# Patient Record
Sex: Male | Born: 1965 | Race: Black or African American | Hispanic: No | Marital: Married | Smoking: Current every day smoker
Health system: Southern US, Community
[De-identification: ages and names within clinical notes are randomized; demographics above are authoritative.]

## PROBLEM LIST (undated history)

## (undated) DIAGNOSIS — M1990 Unspecified osteoarthritis, unspecified site: Secondary | ICD-10-CM

## (undated) DIAGNOSIS — N200 Calculus of kidney: Secondary | ICD-10-CM

## (undated) DIAGNOSIS — M25559 Pain in unspecified hip: Secondary | ICD-10-CM

## (undated) HISTORY — PX: HIP SURGERY: SHX245

## (undated) HISTORY — PX: HIATAL HERNIA REPAIR: SHX195

## (undated) HISTORY — PX: CIRCUMCISION: SUR203

## (undated) HISTORY — PX: APPENDECTOMY: SHX54

---

## 2007-01-08 ENCOUNTER — Emergency Department (HOSPITAL_COMMUNITY): Admission: EM | Admit: 2007-01-08 | Discharge: 2007-01-08 | Payer: Self-pay | Admitting: Family Medicine

## 2008-10-29 ENCOUNTER — Emergency Department (HOSPITAL_COMMUNITY): Admission: EM | Admit: 2008-10-29 | Discharge: 2008-10-29 | Payer: Self-pay | Admitting: Emergency Medicine

## 2008-11-11 ENCOUNTER — Ambulatory Visit (HOSPITAL_COMMUNITY): Admission: RE | Admit: 2008-11-11 | Discharge: 2008-11-12 | Payer: Self-pay | Admitting: Surgery

## 2009-08-04 ENCOUNTER — Emergency Department (HOSPITAL_COMMUNITY): Admission: EM | Admit: 2009-08-04 | Discharge: 2009-08-05 | Payer: Self-pay | Admitting: Emergency Medicine

## 2010-09-13 LAB — URINALYSIS, ROUTINE W REFLEX MICROSCOPIC
Bilirubin Urine: NEGATIVE
Glucose, UA: NEGATIVE mg/dL
Hgb urine dipstick: NEGATIVE
Protein, ur: NEGATIVE mg/dL
Specific Gravity, Urine: 1.03 (ref 1.005–1.030)
pH: 6 (ref 5.0–8.0)

## 2010-10-03 LAB — DIFFERENTIAL
Basophils Absolute: 0.1 10*3/uL (ref 0.0–0.1)
Basophils Relative: 1 % (ref 0–1)
Eosinophils Absolute: 0.2 K/uL (ref 0.0–0.7)
Eosinophils Relative: 2 % (ref 0–5)
Lymphocytes Relative: 37 % (ref 12–46)
Lymphs Abs: 3 K/uL (ref 0.7–4.0)
Monocytes Absolute: 0.4 10*3/uL (ref 0.1–1.0)
Monocytes Relative: 4 % (ref 3–12)
Neutro Abs: 4.5 K/uL (ref 1.7–7.7)
Neutrophils Relative %: 55 % (ref 43–77)

## 2010-10-03 LAB — COMPREHENSIVE METABOLIC PANEL
AST: 21 U/L (ref 0–37)
Calcium: 9.2 mg/dL (ref 8.4–10.5)
GFR calc Af Amer: >60 mL/min (ref >60)
GFR calc non Af Amer: >60 mL/min (ref >60)
Glucose, Bld: 106 mg/dL — ABNORMAL HIGH (ref 70–99)
Potassium: 4.1 mEq/L (ref 3.5–5.1)
Total Bilirubin: 0.7 mg/dL (ref 0.3–1.2)
Total Protein: 6.3 g/dL (ref 6.0–8.3)

## 2010-10-03 LAB — BASIC METABOLIC PANEL
BUN: 8 mg/dL (ref 6–23)
CO2: 28 mEq/L (ref 19–32)
Calcium: 9.9 mg/dL (ref 8.4–10.5)
Chloride: 107 mEq/L (ref 96–112)
Creatinine, Ser: 1.07 mg/dL (ref 0.4–1.5)
GFR calc Af Amer: >60 mL/min (ref >60)
GFR calc non Af Amer: >60 mL/min (ref >60)
Glucose, Bld: 93 mg/dL (ref 70–99)
Potassium: 5 mEq/L (ref 3.5–5.1)

## 2010-10-03 LAB — COMPREHENSIVE METABOLIC PANEL WITH GFR
ALT: 16 U/L (ref 0–53)
Albumin: 3.9 g/dL (ref 3.5–5.2)
Alkaline Phosphatase: 101 U/L (ref 39–117)
BUN: 10 mg/dL (ref 6–23)
CO2: 24 meq/L (ref 19–32)
Chloride: 109 meq/L (ref 96–112)
Creatinine, Ser: 0.92 mg/dL (ref 0.4–1.5)
Sodium: 137 meq/L (ref 135–145)

## 2010-10-03 LAB — CBC
HCT: 43.5 % (ref 39.0–52.0)
Hemoglobin: 15.5 g/dL (ref 13.0–17.0)
Hemoglobin: 15.1 g/dL (ref 13.0–17.0)
MCHC: 34.8 g/dL (ref 30.0–36.0)
MCV: 99.9 fL (ref 78.0–100.0)
Platelets: 198 10*3/uL (ref 150–400)
Platelets: 187 10*3/uL (ref 150–400)
RBC: 4.35 MIL/uL (ref 4.22–5.81)
RDW: 13.2 % (ref 11.5–15.5)
WBC: 8.2 10*3/uL (ref 4.0–10.5)

## 2010-10-03 LAB — LIPASE, BLOOD: Lipase: 20 U/L (ref 11–59)

## 2010-11-07 NOTE — Op Note (Signed)
NAMESAMAY, DELCARLO              ACCOUNT NO.:  [id number]   MEDICAL RECORD NO.:  [id number]          PATIENT TYPE:  OIB   LOCATION:  5118                         FACILITY:  [facility]   PHYSICIAN:  Velora Heckler, MD      DATE OF BIRTH:  [birth year]   DATE OF PROCEDURE:  11/11/2008  DATE OF DISCHARGE:                               OPERATIVE REPORT   PREOPERATIVE DIAGNOSIS:  Ventral hernia, symptomatic.   POSTOPERATIVE DIAGNOSES:  Reducible ventral hernia, reducible umbilical  hernia.   PROCEDURE:  Repair ventral and umbilical hernia with Ethicon UltraPro  mesh.   SURGEON:  Velora Heckler, MD, FACS   ANESTHESIA:  General per Burna Forts, MD   ESTIMATED BLOOD LOSS:  Minimal.   PREPARATION:  ChloraPrep.   COMPLICATIONS:  None.   INDICATIONS:  The patient is a 45 year old black male from [place],  [place].  The patient has had epigastric abdominal pain for the  past month.  This developed after heavy lifting.  A workup in the  emergency department at [facility] included a CT scan showing  a small ventral hernia containing omental fat.  The patient was seen in  the office and scheduled for repair.   BODY OF REPORT:  Procedure was done in OR #60 at the [facility].  The patient was brought to the operating room and  placed in the supine position on the operating room table.  Following  administration of general anesthesia, the patient was prepped and draped  in the usual strict aseptic fashion.  After ascertaining that an  adequate level of anesthesia had been achieved, midline abdominal  incision was made just above the umbilicus.  Dissection was carried down  through the subcutaneous tissues.  Hernia sac was identified.  Hernia  was dissected out.  Dissection was carried down to the fascial plane and  the hernia sac was incised circumferentially around the fascia.  The  hernia sac was excised and discarded.  Hernia was easily  reduced.  Fascial plane was developed circumferentially with the electrocautery.  A finger was inserted through the hernia defect into the peritoneal  cavity and on palpation, there is also an umbilical hernia, which was  approximately 4 cm caudad to the ventral hernia.  Therefore, dissection  was carried inferiorly in the plane of the fascia and the umbilicus was  completely elevated off the abdominal wall.  There was approximately a 2-  cm defect at the umbilicus containing incarcerated preperitoneal fat.  The fascial plane was widely developed around both defects.  Both  fascial defects were then closed transversely with interrupted 0  Ethibond sutures.  Next, a 3 inch x 6 inch sheet of Ethicon UltraPro  mesh was trimmed around the corners and it was placed as an onlay over  the closure.  It was secured to the fascia circumferentially with  interrupted 0 Ethibond sutures.  The umbilicus was reaffixed to the  abdominal wall with a 0 Ethibond suture.  Subcutaneous tissues were  closed with interrupted 3-0 Vicryl sutures.  Skin was anesthetized with  local anesthetic.  Skin was closed with running 4-0 Monocryl  subcuticular suture.  Wound was washed and dried, and benzoin and Steri-  Strips were applied.  Sterile dressings were applied.  The patient was  awakened from anesthesia and brought to the recovery room in stable  condition.  The patient tolerated the procedure well.      Velora Heckler, MD  Electronically Signed     TMG/MEDQ  D:  11/11/2008  T:  11/11/2008  Job:  [id number]

## 2010-11-26 IMAGING — CT CT ABDOMEN W/ CM
1 of 3 series · 14 of 32 positions shown, 19 images · IV contrast (agent unspecified)
Comparison: None

CT ABDOMEN

CLINICAL DATA: Epigastric pain.

CT ABDOMEN AND PELVIS WITH CONTRAST
TECHNIQUE: Multidetector CT imaging of the abdomen and pelvis was
performed using the standard protocol following bolus
administration of intravenous contrast.
Contrast: 125 ml of 6mnipaque-HAA

[Series 2: rtn ap with st · axial · 0.87mm/px · z∈[-311,+79]mm · 14 of 88 slices shown, 19 images]
[im 5/88  soft-tissue]
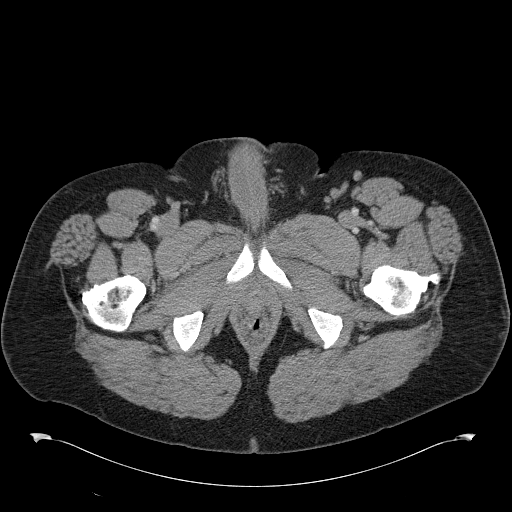
[im 5/88  bone]
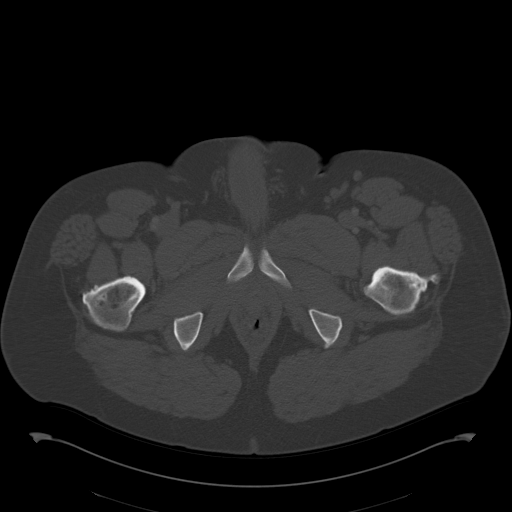
[im 14/88  soft-tissue]
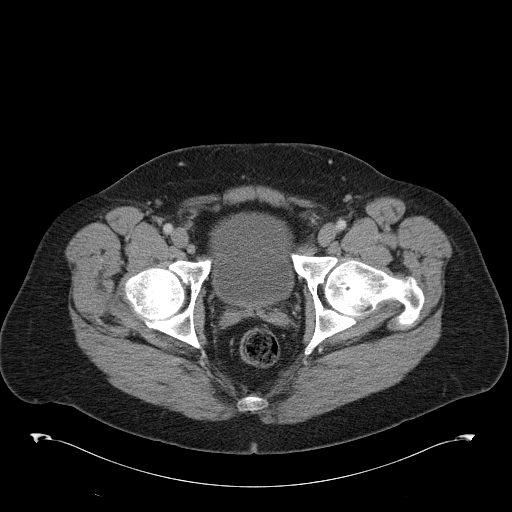
[im 19/88  soft-tissue]
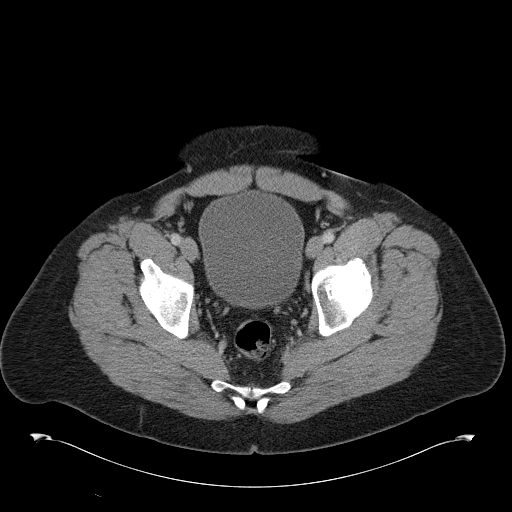
[im 23/88  soft-tissue]
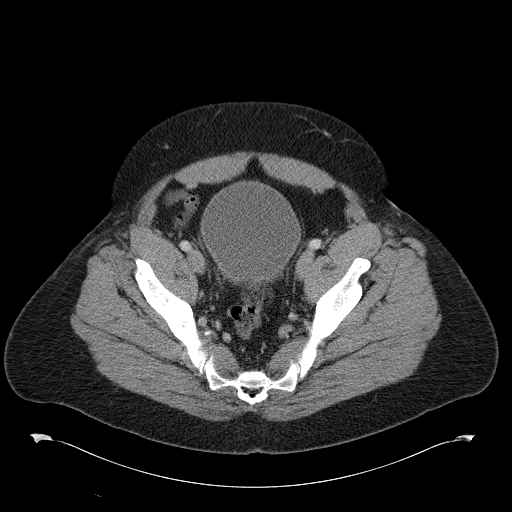
[im 33/88  soft-tissue]
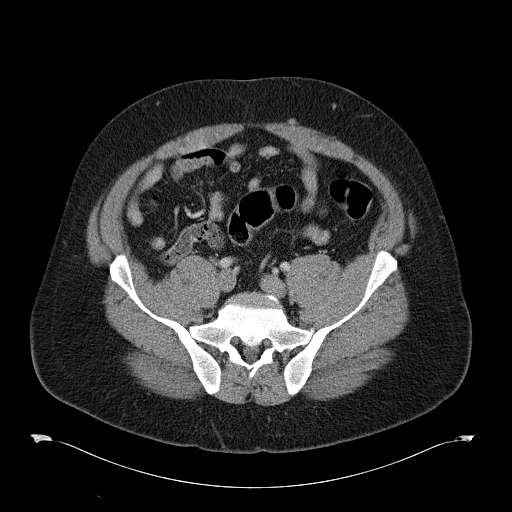
[im 37/88  soft-tissue]
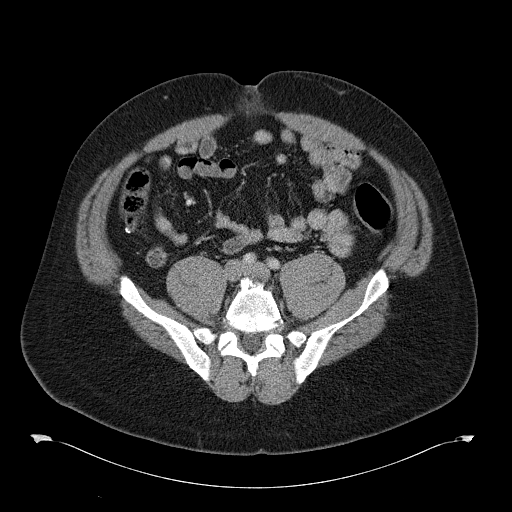
[im 46/88  soft-tissue]
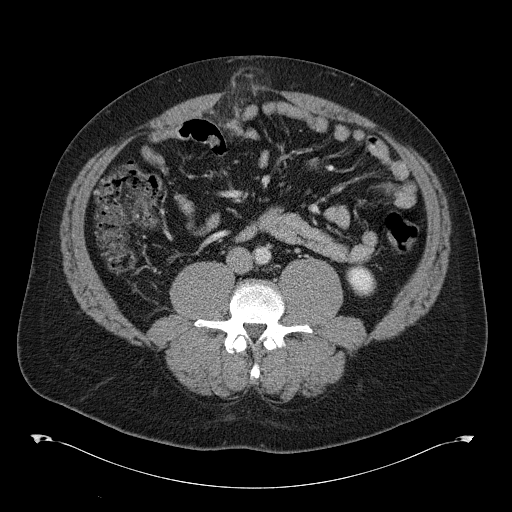
[im 51/88  soft-tissue]
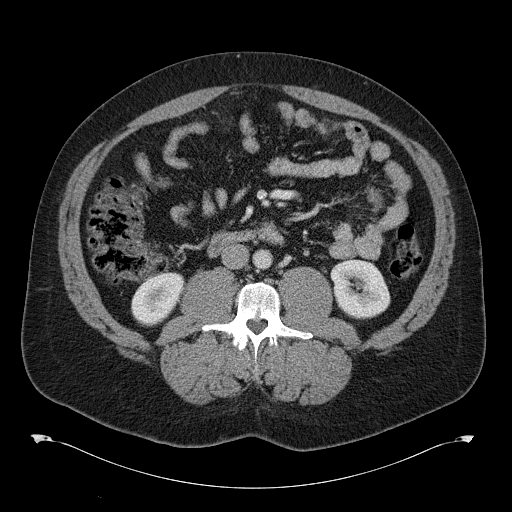
[im 55/88  soft-tissue]
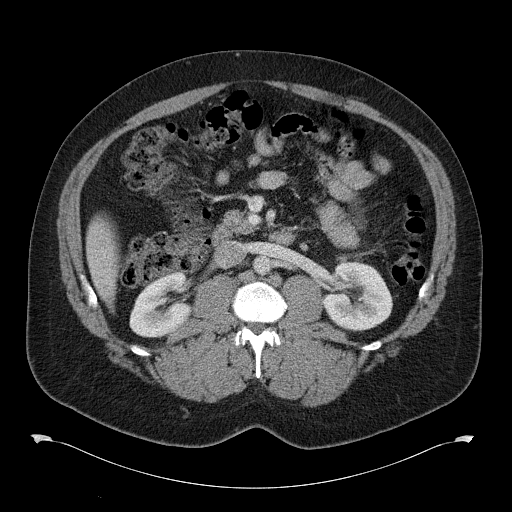
[im 55/88  bone]
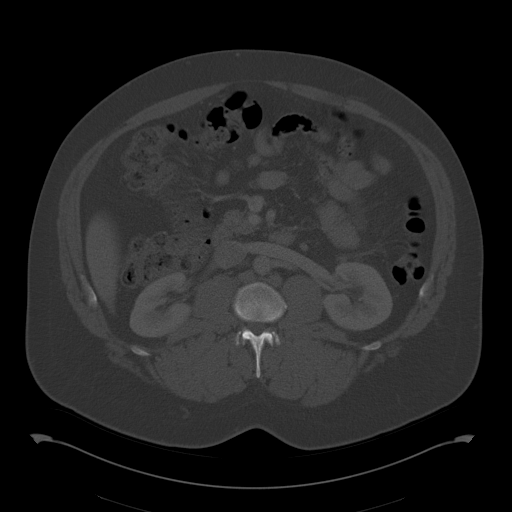
[im 65/88  soft-tissue]
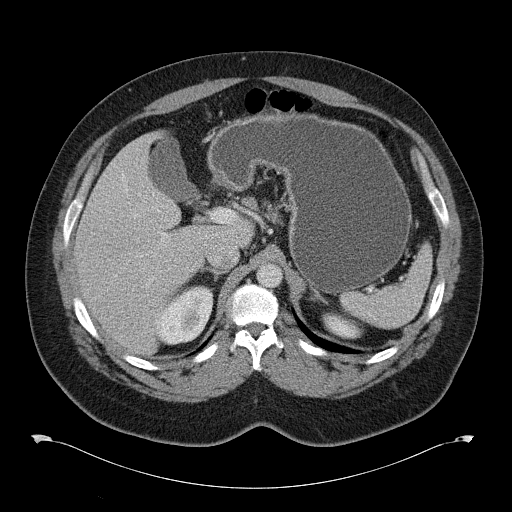
[im 69/88  soft-tissue]
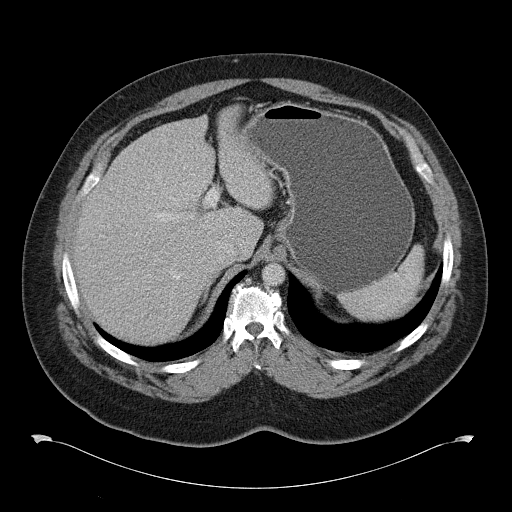
[im 69/88  lung]
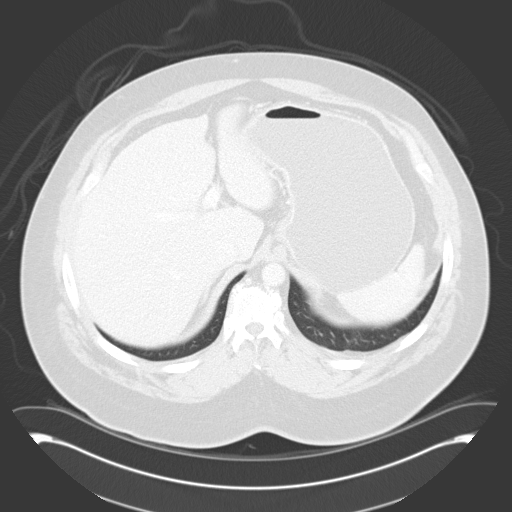
[im 74/88  soft-tissue]
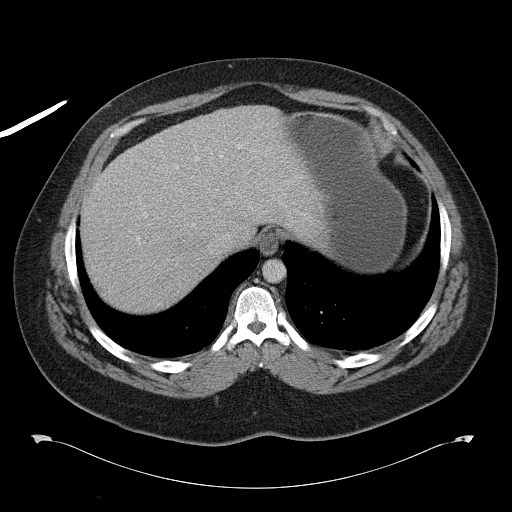
[im 74/88  lung]
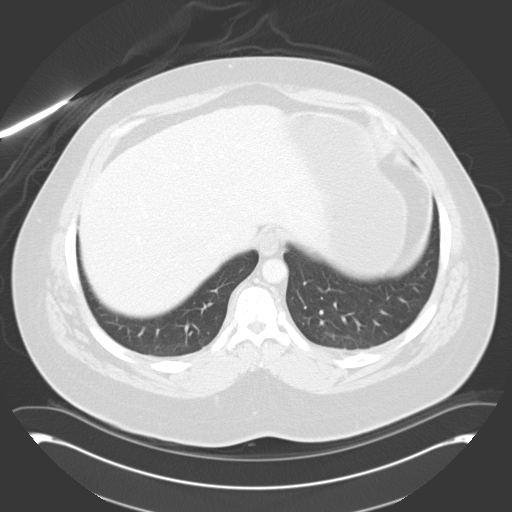
[im 78/88  lung]
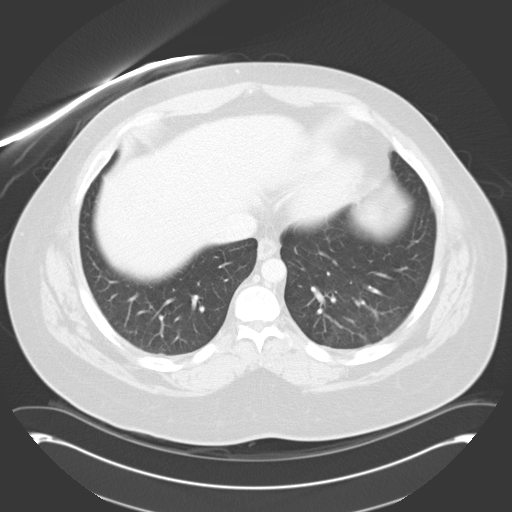
[im 83/88  soft-tissue]
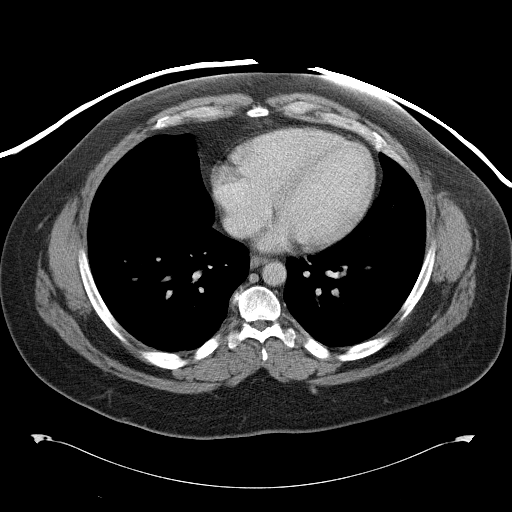
[im 83/88  lung]
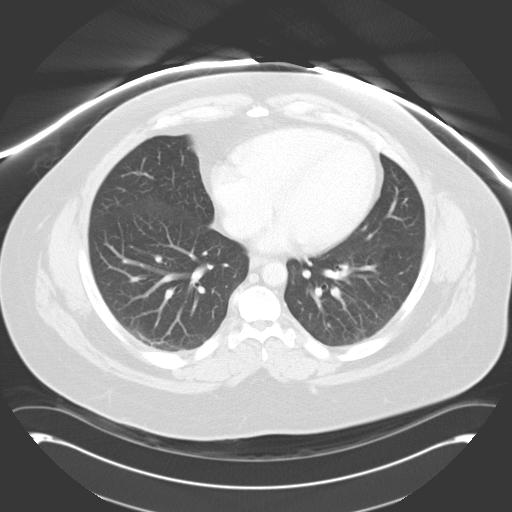

[14 of 32 positions shown; findings below may reference images not displayed]

FINDINGS: The lung bases are clear.  The solid abdominal organs
are normal.  The stomach is distended with fluid.  The duodenum,
small bowel and colon unremarkable.  No mesenteric or
retroperitoneal mass or adenopathy.  The aorta is normal in
caliber.  The major branch vessels are normal.  The portal and
splenic veins are patent.

There is a small anterior abdominal wall hernia just above the
umbilicus.  This contains omental fat and vessels.  Mild
interstitial changes may suggest mild inflammation.  This may be
responsible for the patient's pain.  Recommend clinical
correlation.

No significant bony findings.
IMPRESSION: 1.  Small anterior abdominal wall hernia containing omental fat and
vessels but no bowel.  Mild inflammatory changes.
2.  Unremarkable CT appearance of the solid abdominal organs.
3.  Unremarkable CT appearance of the stomach, small bowel and
colon.

CT PELVIS
FINDINGS: The rectum, sigmoid colon and visualized small bowel
loops are unremarkable.  The bladder appears normal.  No pelvic
mass, adenopathy or free pelvic fluid collection.  There are
surgical changes from previous appendectomy.  No inguinal mass or
hernia.  The bony pelvis is intact.  There are remote surgical
changes involving both hips.
IMPRESSION: No acute pelvic findings, mass lesions or adenopathy.

## 2011-08-31 IMAGING — CR DG CHEST 2V
2 series · 2 of 2 positions shown · non-contrast
Comparison: None

CLINICAL DATA: Headache and body pain

CHEST - 2 VIEW

[w chest pa]
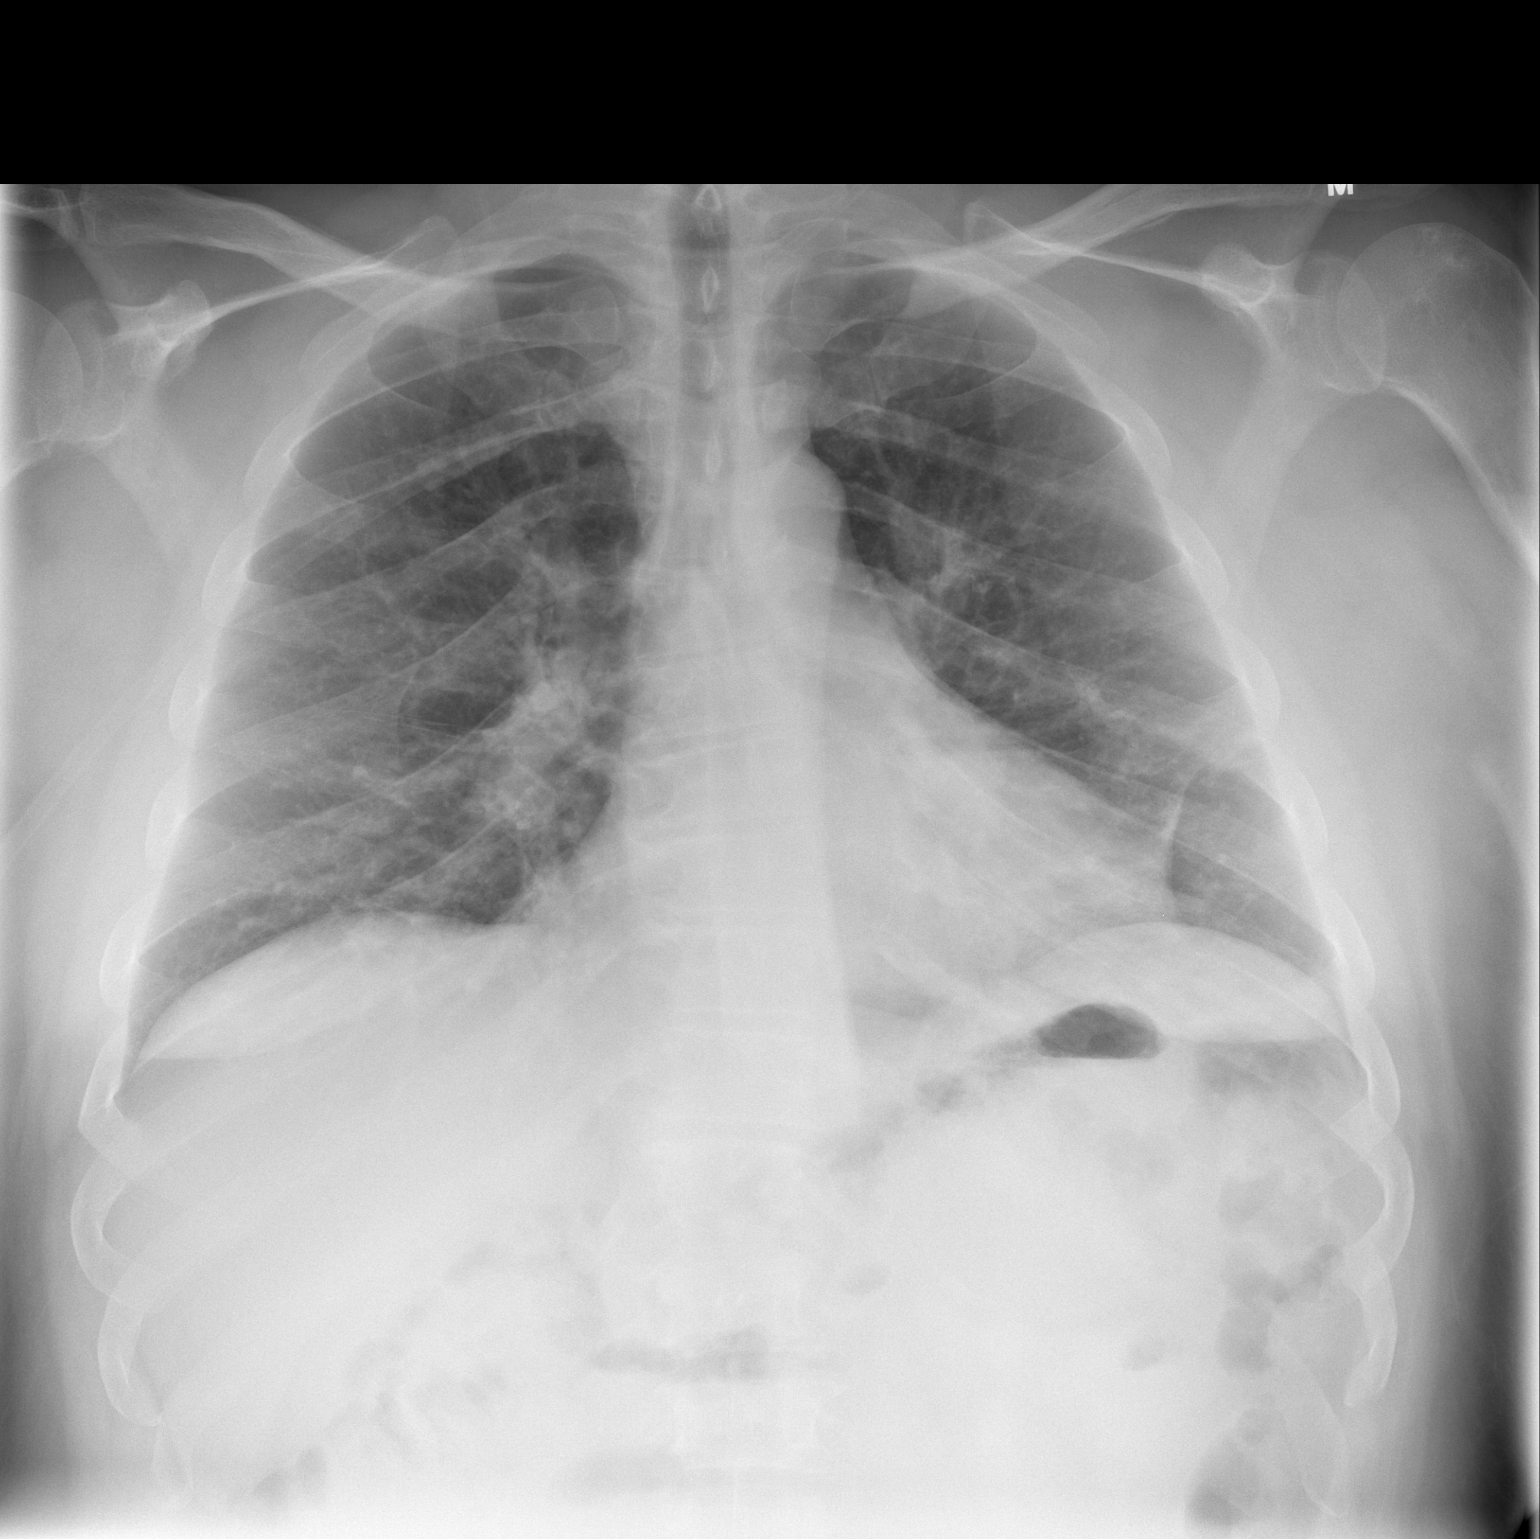

[w chest lat]
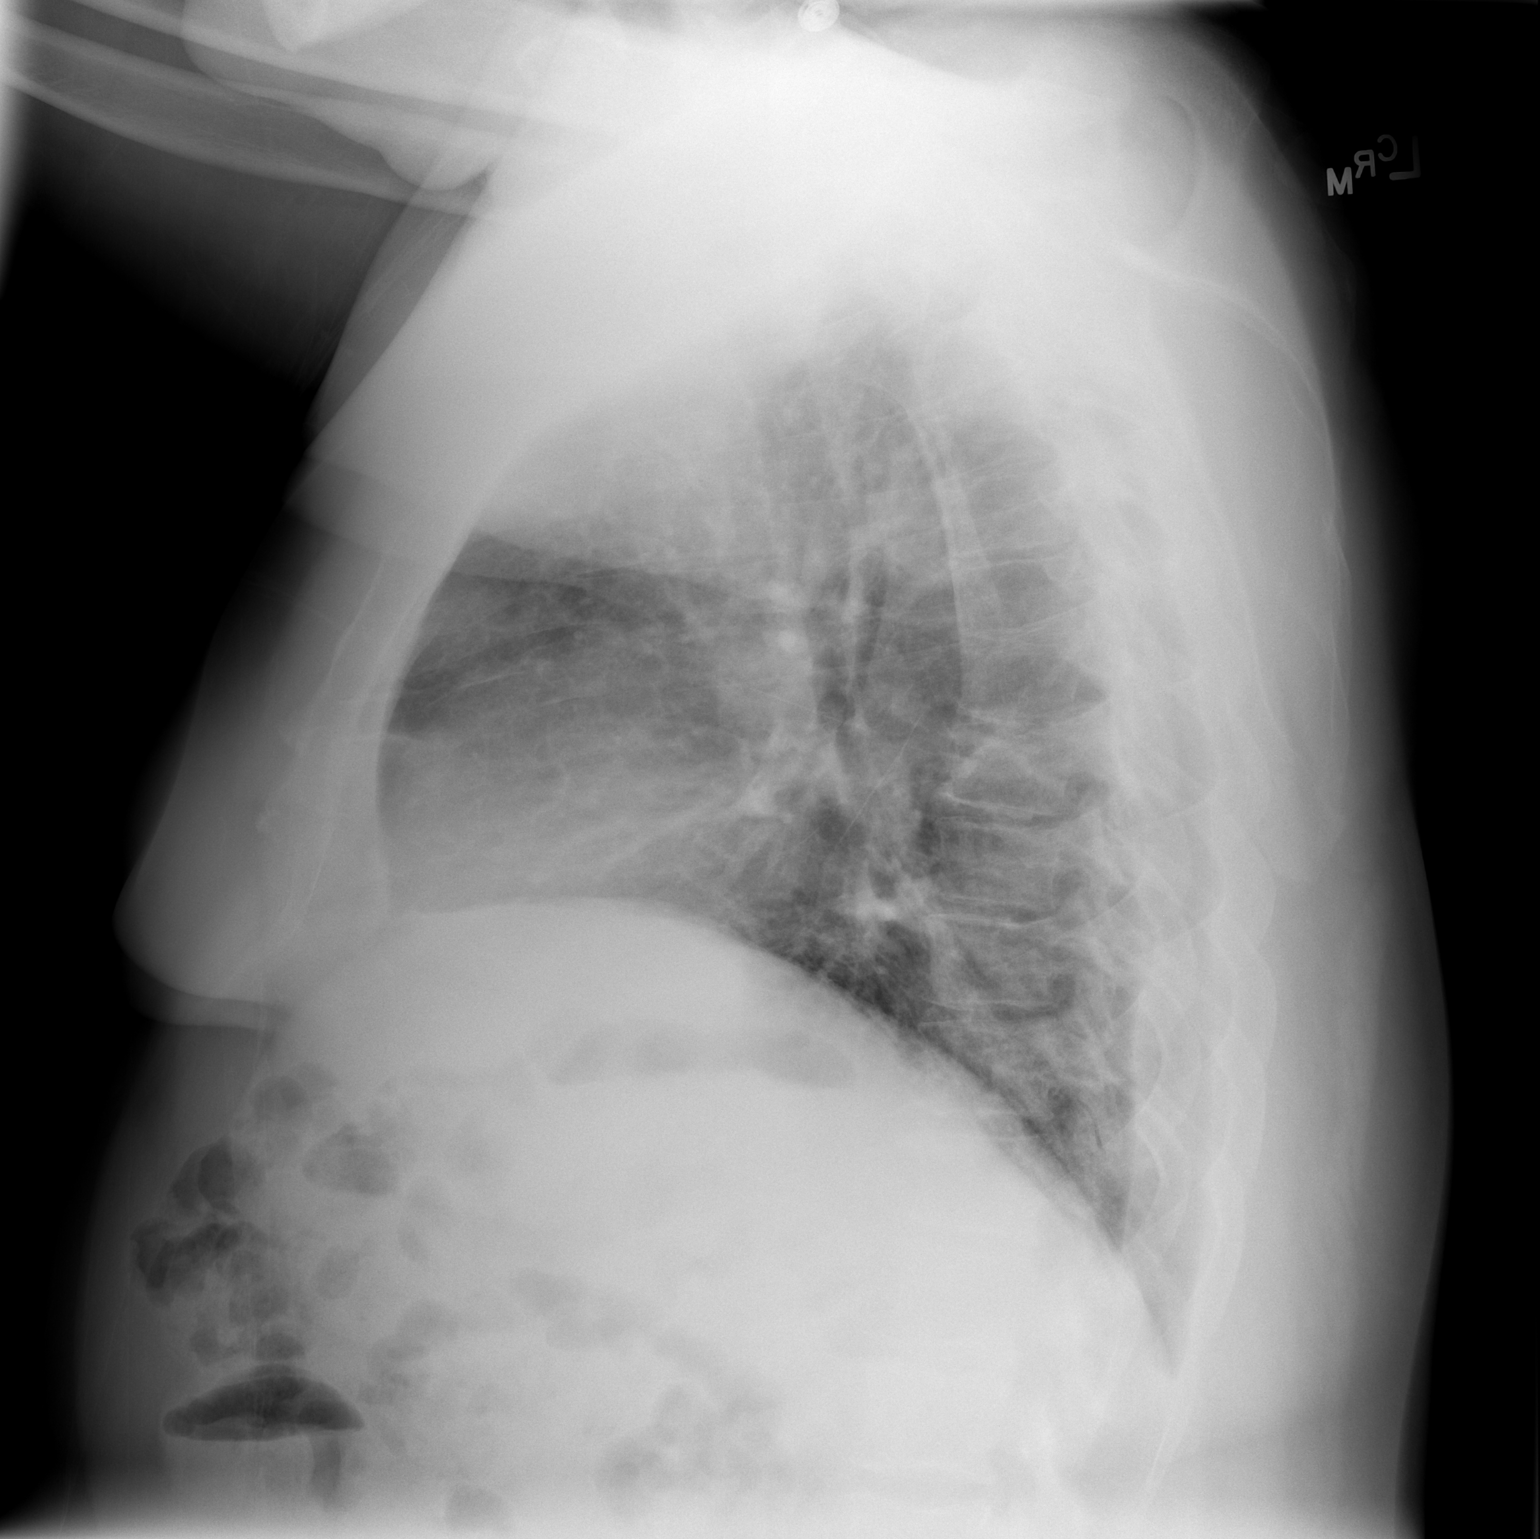

[2 of 2 positions shown; findings below may reference images not displayed]

FINDINGS: The heart size is normal.

No pleural effusion or pulmonary edema.

Scar versus atelectasis is noted within the left lung base.

No airspace consolidation identified.
IMPRESSION: 1.  Left base scar versus atelectasis.

## 2022-06-21 DIAGNOSIS — M25552 Pain in left hip: Secondary | ICD-10-CM | POA: Insufficient documentation

## 2022-10-18 DIAGNOSIS — M25551 Pain in right hip: Secondary | ICD-10-CM | POA: Insufficient documentation

## 2022-10-23 DIAGNOSIS — M1611 Unilateral primary osteoarthritis, right hip: Secondary | ICD-10-CM | POA: Insufficient documentation

## 2022-12-25 ENCOUNTER — Encounter: Payer: Self-pay | Admitting: Physician Assistant

## 2022-12-25 ENCOUNTER — Other Ambulatory Visit (INDEPENDENT_AMBULATORY_CARE_PROVIDER_SITE_OTHER): Payer: Self-pay

## 2022-12-25 ENCOUNTER — Ambulatory Visit (INDEPENDENT_AMBULATORY_CARE_PROVIDER_SITE_OTHER): Payer: Self-pay | Admitting: Physician Assistant

## 2022-12-25 DIAGNOSIS — M25551 Pain in right hip: Secondary | ICD-10-CM

## 2022-12-25 DIAGNOSIS — G8929 Other chronic pain: Secondary | ICD-10-CM

## 2022-12-25 DIAGNOSIS — M545 Low back pain, unspecified: Secondary | ICD-10-CM

## 2022-12-25 DIAGNOSIS — M25552 Pain in left hip: Secondary | ICD-10-CM

## 2022-12-25 DIAGNOSIS — M167 Other unilateral secondary osteoarthritis of hip: Secondary | ICD-10-CM

## 2022-12-25 NOTE — Progress Notes (Signed)
Office Visit Note   Patient: Leon Rodriguez           Date of Birth: 1965-07-04           MRN: [id number] Visit Date: 12/25/2022              Requested by: No referring provider defined for this encounter. PCP: No primary care provider on file.   Assessment & Plan: Visit Diagnoses:  1. Chronic bilateral low back pain, unspecified whether sciatica present   2. Bilateral hip pain   3. Other secondary osteoarthritis of right hip     Plan: Given patient's lack of primary care provider if that he is not having seen by a primary care provider in years recommend that he establish with a primary care provider.  Will have him undergo an MRI of his left hip to rule out AVN and also evaluate cartilage.  Once he has had the study performed having follow-up with Dr. Magnus Ivan.  Regards to his right hip he understands that he has significant arthritis and due to the fact of his failure of conservative treatment to temporize his pain, hip replacement could provide him with good relief.  However patient has a very legitimate concern for undergoing hip replacement as his father died at age 34 when he had bilateral hip replacements performed and developed pulmonary embolus.  We had a long talk about how we perform hip replacement from the anterior approach and that we would only perform 1 hip at the time.  Also discussed anticoagulation postop.  We discussed the postoperative protocol for total hip arthroplasty at length.  Questions were answered.  Follow-Up Instructions: Return for after MRI.   Orders:  Orders Placed This Encounter  Procedures   XR HIPS BILAT W OR W/O PELVIS 2V   XR Lumbar Spine 2-3 Views   No orders of the defined types were placed in this encounter.     Procedures: No procedures performed   Clinical Data: No additional findings.   Subjective: Chief Complaint  Patient presents with   Left Hip - Pain   Right Hip - Pain    HPI HPI: Leon Rodriguez 57 year old male comes  in today for second opinion of bilateral hip pain.  He states he has had pain in both hips that began last October or November.  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  He has had no new injury to either hip.  He has been seen at [facility] was given a left hip injection which took away his severe pain that he was having in the hip.  The right hip pain gave him no relief.  Does have some back pain.  He has stiffness that runs down the medial aspect of both legs down to the ankles.  He has difficulty sleeping due to the pain ranks pain to be 10 out of 10 pain at worst.  Has trouble donning shoes and socks.  Also states that he is unable to mow his grass due to the hip pain.  He also notes that it affects his sex life.  He has tried Mobic without any real relief.  He is someone that reports himself to be healthy but has no primary care physician states he does not go to the doctor unless he absolutely has to.  Review of Systems See HPI otherwise negative or noncontributory  Objective: Vital Signs: There were no vitals taken for this visit.  Physical Exam Constitutional:      Appearance: He is not ill-appearing or diaphoretic.  Pulmonary:     Effort: Pulmonary effort is normal.  Neurological:     Mental Status: He is alert and oriented to person, place, and time.  Psychiatric:        Mood and Affect: Mood normal.        Behavior: Behavior normal.     Ortho Exam Knees: Bilateral hips severely limited internal rotation of both hips.  Good external rotation of both hips.  Internal rotation both hips causes significant pain.  Lower extremities 5 out of 5 strength throughout lower extremities negative straight leg raise bilaterally.  Sensation grossly intact bilateral feet to light touch.  Specialty Comments:  No specialty comments available.  Imaging: XR HIPS BILAT W OR W/O  PELVIS 2V  Result Date: 12/25/2022 Bilateral hips lateral view and AP pelvis: No acute fractures.  Postoperative changes with large calcifications just off the proximal femoral shaft at the level just inferior to the greater trochanters.  Cystic changes of bilateral femoral heads could be postsurgical versus AVN.  Right hip near bone-on-bone arthritis.  Left hip joint is overall well-maintained.  XR Lumbar Spine 2-3 Views  Result Date: 12/25/2022 Lumbar spine 2 views: No acute fractures.  Slight scoliosis.  L4 and L5 anterior vertebral spurring.  Disc space overall well-maintained.  No acute findings.    PMFS History: There are no problems to display for this patient.  No past medical history on file.  No family history on file.   Social History   Occupational History   Not on file  Tobacco Use   Smoking status: Not on file   Smokeless tobacco: Not on file  Substance and Sexual Activity   Alcohol use: Not on file   Drug use: Not on file   Sexual activity: Not on file

## 2023-01-06 ENCOUNTER — Emergency Department (HOSPITAL_COMMUNITY): Payer: Self-pay

## 2023-01-06 ENCOUNTER — Observation Stay (HOSPITAL_COMMUNITY)
Admission: EM | Admit: 2023-01-06 | Discharge: 2023-01-07 | Disposition: A | Discharge status: Home / Self Care | Payer: Self-pay | Attending: Internal Medicine | Admitting: Internal Medicine

## 2023-01-06 ENCOUNTER — Other Ambulatory Visit: Payer: Self-pay

## 2023-01-06 ENCOUNTER — Encounter (HOSPITAL_COMMUNITY): Payer: Self-pay

## 2023-01-06 DIAGNOSIS — N453 Epididymo-orchitis: Secondary | ICD-10-CM | POA: Diagnosis present

## 2023-01-06 DIAGNOSIS — N209 Urinary calculus, unspecified: Secondary | ICD-10-CM | POA: Diagnosis not present

## 2023-01-06 DIAGNOSIS — R824 Acetonuria: Secondary | ICD-10-CM | POA: Insufficient documentation

## 2023-01-06 DIAGNOSIS — N433 Hydrocele, unspecified: Secondary | ICD-10-CM | POA: Diagnosis not present

## 2023-01-06 DIAGNOSIS — R509 Fever, unspecified: Secondary | ICD-10-CM | POA: Diagnosis present

## 2023-01-06 DIAGNOSIS — N529 Male erectile dysfunction, unspecified: Secondary | ICD-10-CM | POA: Insufficient documentation

## 2023-01-06 DIAGNOSIS — Z7982 Long term (current) use of aspirin: Secondary | ICD-10-CM | POA: Diagnosis not present

## 2023-01-06 DIAGNOSIS — A419 Sepsis, unspecified organism: Secondary | ICD-10-CM | POA: Diagnosis not present

## 2023-01-06 DIAGNOSIS — Z1152 Encounter for screening for COVID-19: Secondary | ICD-10-CM | POA: Insufficient documentation

## 2023-01-06 DIAGNOSIS — F17200 Nicotine dependence, unspecified, uncomplicated: Secondary | ICD-10-CM | POA: Insufficient documentation

## 2023-01-06 LAB — CBC WITH DIFFERENTIAL/PLATELET
Abs Immature Granulocytes: 0.04 10*3/uL (ref 0.00–0.07)
Basophils Absolute: 0 10*3/uL (ref 0.0–0.1)
Basophils Relative: 0 %
Eosinophils Absolute: 0 10*3/uL (ref 0.0–0.5)
Eosinophils Relative: 0 %
HCT: 44.6 % (ref 39.0–52.0)
Hemoglobin: 16 g/dL (ref 13.0–17.0)
Immature Granulocytes: 0 %
Lymphocytes Relative: 9 %
Lymphs Abs: 0.9 10*3/uL (ref 0.7–4.0)
MCH: 34.1 pg — ABNORMAL HIGH (ref 26.0–34.0)
MCHC: 35.9 g/dL (ref 30.0–36.0)
MCV: 95.1 fL (ref 80.0–100.0)
Monocytes Absolute: 0.7 10*3/uL (ref 0.1–1.0)
Monocytes Relative: 7 %
Neutro Abs: 8.8 10*3/uL — ABNORMAL HIGH (ref 1.7–7.7)
Neutrophils Relative %: 84 %
Platelets: 239 10*3/uL (ref 150–400)
RBC: 4.69 MIL/uL (ref 4.22–5.81)
RDW: 12.3 % (ref 11.5–15.5)
WBC: 10.5 10*3/uL (ref 4.0–10.5)
nRBC: 0 % (ref 0.0–0.2)

## 2023-01-06 LAB — URINALYSIS, W/ REFLEX TO CULTURE (INFECTION SUSPECTED)
Bilirubin Urine: NEGATIVE
Glucose, UA: NEGATIVE mg/dL
Ketones, ur: NEGATIVE mg/dL
Nitrite: POSITIVE — AB
Protein, ur: NEGATIVE mg/dL
Specific Gravity, Urine: 1.009 (ref 1.005–1.030)
pH: 6 (ref 5.0–8.0)

## 2023-01-06 LAB — COMPREHENSIVE METABOLIC PANEL
ALT: 29 U/L (ref 0–44)
AST: 35 U/L (ref 15–41)
Albumin: 3.6 g/dL (ref 3.5–5.0)
Alkaline Phosphatase: 94 U/L (ref 38–126)
Anion gap: 15 (ref 5–15)
BUN: 7 mg/dL (ref 6–20)
CO2: 23 mmol/L (ref 22–32)
Calcium: 9.4 mg/dL (ref 8.9–10.3)
Chloride: 96 mmol/L — ABNORMAL LOW (ref 98–111)
Creatinine, Ser: 1.01 mg/dL (ref 0.61–1.24)
GFR, Estimated: >60 mL/min (ref >60)
Glucose, Bld: 163 mg/dL — ABNORMAL HIGH (ref 70–99)
Potassium: 4 mmol/L (ref 3.5–5.1)
Sodium: 134 mmol/L — ABNORMAL LOW (ref 135–145)
Total Bilirubin: 0.8 mg/dL (ref 0.3–1.2)
Total Protein: 7.2 g/dL (ref 6.5–8.1)

## 2023-01-06 LAB — RESP PANEL BY RT-PCR (RSV, FLU A&B, COVID)  RVPGX2
Influenza A by PCR: NEGATIVE
Influenza B by PCR: NEGATIVE
Resp Syncytial Virus by PCR: NEGATIVE
SARS Coronavirus 2 by RT PCR: NEGATIVE

## 2023-01-06 LAB — PROTIME-INR
INR: 1.1 (ref 0.8–1.2)
Prothrombin Time: 14 seconds (ref 11.4–15.2)

## 2023-01-06 LAB — URINALYSIS, ROUTINE W REFLEX MICROSCOPIC
Glucose, UA: NEGATIVE mg/dL
Ketones, ur: 20 mg/dL — AB
Nitrite: POSITIVE — AB
Protein, ur: NEGATIVE mg/dL
Specific Gravity, Urine: 1.021 (ref 1.005–1.030)
WBC, UA: >50 WBC/hpf (ref 0–5)
pH: 5 (ref 5.0–8.0)

## 2023-01-06 LAB — APTT: aPTT: 30 seconds (ref 24–36)

## 2023-01-06 LAB — HEMOGLOBIN A1C
Hgb A1c MFr Bld: 5.6 % (ref 4.8–5.6)
Mean Plasma Glucose: 114.02 mg/dL

## 2023-01-06 LAB — I-STAT CG4 LACTIC ACID, ED: Lactic Acid, Venous: 1.1 mmol/L (ref 0.5–1.9)

## 2023-01-06 LAB — HIV ANTIBODY (ROUTINE TESTING W REFLEX): HIV Screen 4th Generation wRfx: NONREACTIVE

## 2023-01-06 MED ORDER — VANCOMYCIN HCL 2000 MG/400ML IV SOLN
2000.0000 mg | Freq: Once | INTRAVENOUS | Status: DC
  Administered 2023-01-06: 2000 mg via INTRAVENOUS
  Filled 2023-01-06: qty 400

## 2023-01-06 MED ORDER — LACTATED RINGERS IV SOLN: INTRAVENOUS | Status: DC

## 2023-01-06 MED ORDER — LACTATED RINGERS IV BOLUS (SEPSIS)
1000.0000 mL | Freq: Once | INTRAVENOUS | Status: AC
  Administered 2023-01-06: 1000 mL via INTRAVENOUS

## 2023-01-06 MED ORDER — ENOXAPARIN SODIUM 40 MG/0.4ML IJ SOSY
40.0000 mg | PREFILLED_SYRINGE | INTRAMUSCULAR | Status: DC
  Administered 2023-01-06: 40 mg via SUBCUTANEOUS
  Filled 2023-01-06: qty 0.4

## 2023-01-06 MED ORDER — ACETAMINOPHEN 500 MG PO TABS
1000.0000 mg | ORAL_TABLET | Freq: Once | ORAL | Status: AC
  Administered 2023-01-06: 1000 mg via ORAL
  Filled 2023-01-06: qty 2

## 2023-01-06 MED ORDER — LACTATED RINGERS IV BOLUS (SEPSIS)
500.0000 mL | Freq: Once | INTRAVENOUS | Status: AC
  Administered 2023-01-06: 500 mL via INTRAVENOUS

## 2023-01-06 MED ORDER — SODIUM CHLORIDE 0.9 % IV SOLN
2.0000 g | Freq: Three times a day (TID) | INTRAVENOUS | Status: DC
  Administered 2023-01-06 – 2023-01-07 (×2): 2 g via INTRAVENOUS
  Filled 2023-01-06 (×2): qty 12.5

## 2023-01-06 MED ORDER — METRONIDAZOLE 500 MG/100ML IV SOLN
500.0000 mg | Freq: Once | INTRAVENOUS | Status: AC
  Administered 2023-01-06: 500 mg via INTRAVENOUS
  Filled 2023-01-06: qty 100

## 2023-01-06 MED ORDER — VANCOMYCIN HCL IN DEXTROSE 1-5 GM/200ML-% IV SOLN
1000.0000 mg | Freq: Once | INTRAVENOUS | Status: DC
  Filled 2023-01-06: qty 200

## 2023-01-06 MED ORDER — ACETAMINOPHEN 500 MG PO TABS
1000.0000 mg | ORAL_TABLET | Freq: Three times a day (TID) | ORAL | Status: DC | PRN
  Administered 2023-01-06 – 2023-01-07 (×2): 1000 mg via ORAL
  Filled 2023-01-06 (×2): qty 2

## 2023-01-06 MED ORDER — POLYETHYLENE GLYCOL 3350 17 G PO PACK: 17.0000 g | PACK | Freq: Every day | ORAL | Status: DC | PRN

## 2023-01-06 MED ORDER — SODIUM CHLORIDE 0.9 % IV SOLN
2.0000 g | Freq: Once | INTRAVENOUS | Status: AC
  Administered 2023-01-06: 2 g via INTRAVENOUS
  Filled 2023-01-06: qty 12.5

## 2023-01-06 NOTE — Sepsis Progress Note (Signed)
Elink monitoring code sepsis 

## 2023-01-06 NOTE — ED Notes (Signed)
Patient transported to X-ray 

## 2023-01-06 NOTE — H&P (Addendum)
Date: 01/06/2023               Patient Name:  Leon Rodriguez MRN: [id number]  DOB: 28-Dec-1965 Age / Sex: 57 y.o., male   PCP: Pcp, No              Medical Service: Internal Medicine Teaching Service              Attending Physician: Dr. Dickie La, MD    First Contact: Governor Rooks, MS3 Pager: [phone]  Second Contact: Dr. Faith Rogue Pager: [phone]  Third Contact Dr. Morene Crocker Pager: [phone]       After Hours (After 5p/  First Contact Pager: [phone]  weekends / holidays): Second Contact Pager: [phone]   Chief Complaint: Fever and testicular pain  History of Present Illness:   SHAQUELL HEHN is a 57 y.o. M with a PMHx significant for arthritis in L hip, hiatal hernia s/p mesh who presented with a week of intermittent fevers and one day of R testicular pain admitted for epididymo-orchitis.  ED course is significant for scrotal ultrasound showing right epididymo-orchitis and small reactive hydrocele without torsion. CT AP with 2 mm nonobstructing nephrolithiasis son lower pole of L kidney. UA with moderate leukocytes, nitrate positive, many bacteria. He was started on empiric antibiotics with Cefepime, Vancomycin, metronidazole and given LR fluid boluses before being started on IV LR. Lactic acid was negative and there was no sign of organ dysfunction.  Pt was examined at bedside. Pt reports intermittent fevers beginning at the start of last week (around 7/8) which have varied in intensity with highest recorded home temp of 103F. He used Tylenol and ibuprofen to help with the fevers. He then started to experience R testicular pain yesterday (7/13), which came on suddenly and which he states gets worse with trying to wash himself and manually manipulating the testicle. He reports his scrotal skin appears normal. He denies any trauma to his genital region prior to admission; he denies remote hx of genital trauma. He denies penile discharge. He denies recent increased  activity such as running. He denies chest pain, shortness of breath. He reports headache which began upon arrival to hospital. He denies sick contacts. He reports he has not been eating much the past 3 days. He denies history of vasectomy, any penile or testicular surgery. He had chlamydia when he was 23 and was treated for this at that time; otherwise denies STIs. He has not had intercourse in 2 years, which he relates to difficulty maintaining an erection.  He reports a history for at least 2 years of difficulty stopping and starting his urinary stream, and states he has had to use water sounds to help promote urination. He recently began taking Artribion Vitaminado in early June, and reports his urine became dark and malodorous after starting this. He has since stopped this medication, but continues to report dark and malodorous urine. He also reports increased urinary frequency, and has been waking up at night to use the bathroom. He reports incomplete bladder emptying. He denies urinary pain, burning. He also reports a few weeks of lower back/flank pain, which worsened yesterday, different from his hip pain. He denies constipation and reports most recent BM was yesterday.  He has been experiencing bilateral hip pain since 06/2022. He was given a 30-day supply of Meloxicam for this in February, which he states did not help much and so he stopped taking this after his supply ran out. He reports decreased activity levels  related to his hip pain. He has been taking one BC Goody's powder each morning for years; last dose of this was Wed 7/10. He has a history of hiatal hernia and reports he had mesh placement. Hx of appendectomy. He reports he is up to date on childhood vaccinations.  Meds:  - Goody's powder 1 daily for years (last taken Wed 7/10) - Tylenol prn - Ibuprofen prn - Multivitamin daily Current Meds  Medication Sig   Ascorbic Acid (VITAMIN C PO) Take 1 tablet by mouth daily.    Aspirin-Acetaminophen-Caffeine (GOODYS EXTRA STRENGTH PO) Take 1 Package by mouth 2 (two) times daily as needed (Pain).   Boswellia-Glucosamine-Vit D (OSTEO BI-FLEX ONE PER DAY PO) Take 1 tablet by mouth daily.   Multiple Vitamin (MULTIVITAMIN PO) Take 1 tablet by mouth daily.   Multiple Vitamins-Minerals (ZINC PO) Take 1 tablet by mouth daily.   Turmeric (QC TUMERIC COMPLEX PO) Take 1 tablet by mouth daily.    Allergies: NKDA Allergies as of 01/06/2023   (Not on File)   History reviewed. No pertinent past medical history.  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  Physical Exam: Blood pressure 118/68, pulse (!) 117, temperature (!) 100.4 F (38 C), temperature source Temporal, resp. rate 15, height 5\' 10"  (1.778 m), weight 104.3 kg, SpO2 100%. General: Pt is lying down in hospital bed, appears in pain. Warm to touch. Cardiovascular: Tachycardic with regular rhythm; no murmurs, rubs, gallops. Pulmonary: Normal work of breathing. Lungs clear to auscultation bilaterally. Abdomen: Normal bowel sounds. Soft and nondistended in all four quadrants. No tenderness to palpation; no rebound tenderness, no guarding. CVA exam deferred due to pt pain with certain positioning. No evidence of inguinal hernia. Genital: Erythematous R side of scrotum, see image below. Tenderness to palpation of posterior R testicle. Edematous R testicle. No cremasteric reflex could be initiated given pt unable to relax due to pain, discomfort. Neuro/Psych: Alert and  oriented to person, place, event. (Time not formerly assessed.) Normal affect.    CMP     Component Value Date/Time   NA 134 (L) 01/06/2023 1027   K 4.0 01/06/2023 1027   CL 96 (L) 01/06/2023 1027   CO2 23 01/06/2023 1027   GLUCOSE 163 (H) 01/06/2023 1027   BUN 7 01/06/2023 1027   CREATININE 1.01 01/06/2023 1027   CALCIUM 9.4 01/06/2023 1027   PROT 7.2 01/06/2023 1027   ALBUMIN 3.6 01/06/2023 1027   AST 35 01/06/2023 1027   ALT 29 01/06/2023 1027   ALKPHOS 94 01/06/2023 1027   BILITOT 0.8 01/06/2023 1027   GFRNONAA >60 01/06/2023 1027   CBC    Component Value Date/Time   WBC 10.5 01/06/2023 1027   RBC 4.69 01/06/2023 1027   HGB 16.0 01/06/2023 1027   HCT 44.6 01/06/2023 1027   PLT 239 01/06/2023 1027   MCV 95.1 01/06/2023 1027   MCH 34.1 (H) 01/06/2023 1027   MCHC 35.9 01/06/2023 1027   RDW 12.3  01/06/2023 1027   LYMPHSABS 0.9 01/06/2023 1027   MONOABS 0.7 01/06/2023 1027   EOSABS 0.0 01/06/2023 1027   BASOSABS 0.0 01/06/2023 1027   HA1c: 5.6% Lactic Acid: 1.1 Urinalysis: Positive nitrite, Moderate leukocytes PT/INR: 14/1.1  PTT: 30 Resp Culture Negative  Pending Blood Culture, Urine Culture, GC/Chlamydia, HIV  EKG: personally reviewed my interpretation is sinus tachycardia.  CXR: personally reviewed my interpretation is negative CXR.  [place] AP 7/14: 1. No acute or focal lesion to explain the patient's symptoms. 2. 2 mm nonobstructing stone at the lower pole of the left kidney.  US Scrotum w Doppler 7/14: 1. Right epididymo-orchitis. 2. Small reactive complex right hydrocele.  Assessment & Plan by Problem: Principal Problem:   Epididymo-orchitis  BASSAM SACCO is a 57 y.o. M with a PMHx significant for arthritis in L hip, hiatal hernia s/p mesh who presented with a week of intermittent fevers and one day of R testicular pain admitted for epididymo-orchitis.  Epididymo-orchitis Pt presenting with one week of fevers and one day of R testicular pain.  Testicular torsion ruled out with Scrotal US negative for torsion; did show R epididymo-orchitis and small reactive complex right hydrocele. Possible etiology of epididymo-orchitis is BPH vs STI vs recurrent epididymitis. Less likely STI less likely given pt denies recent sexual intercourse; will rule out STI with testing. Less likely BPH given no evidence of enlargement on CT AP. Pt meets SIRS criteria with tachycardia, tachypnea, and fever; not septic criteria at this time given no overt evidence of organ dysfunction; will continue to monitor for signs of progression into sepsis. Will treat infection with IV abx and fluids.  - IV cefepime - Fluids with IV LR at 150 mL/hr - Tylenol 1000 mg q6h prn for fever - Ice to use prn for pain - Pending blood culture, urine culture, GC/Chlamydia, HIV tests  R Hydrocele Scrotal US done 7/14 showed small reactive complex right hydrocele. Etiology is pyocele vs hematocele, likely in setting of epididymal infection. Most common sources of infection E coli, Pseudomonas, other coliforms in pts over 35, such as this pt. Will continue to monitor as infection is treated.  Pyuria Myoglobinuria Pt with positive nitrite, bacteria, pyuria on urinalysis. No proteinuria, hematuria on dipstick; no concern for glomerulonephritis. No evidence of pyelonephritis, hydronephrosis on CT AP. Likely stemming from epididymal infection. Will evaluate further with cultures.  - Blood, urine cultures pending  Ketonuria Urinalysis initially with positive ketones. Ha1c 5.6% with mildly elevated BG and normal lactic acid levels; no concern for DKA. Ketones then negative on repeat urinalysis. Suspect initial elevation in setting of decreased po intake. No further evaluation indicated.  Nephrolithiasis On CT AP done 7/14, 2 mm nonobstructing stone at the lower pole of the left kidney. No further evaluation/intervention indicated.  Possible BPH Pt reports at least 2 years with difficulty  stopping and starting urinary stream. Also reports increased nighttime awakenings to urinate. However, prostate normal on CT AP done 7/14. Possible that pt's symptoms resulting from intermittent episodes of epididymitis. Will recommend pt establish with PCP outpatient for further management.  Erectile Dysfunction Pt reports over two years without sexual intercourse given difficulty maintaining an erection. Possibly from marijuana use vs hyperlipidemia. Will recommend pt establish with PCP outpatient for further management.  Code Status: Full Diet: Heart IV Fluid: LR at 150 mL/hr DVT Prophylaxis: Lovenox  Prior to Admission Living Arrangement: Home Anticipated Discharge Location: Home Barriers to Discharge: Medical workup + management  Dispo: Admit patient to Observation with  expected length of stay less than 2 midnights.  Signed: Governor Rooks, Medical Student Attestation for Student Documentation:  I personally was present and performed or re-performed the history, physical exam and medical decision-making activities of this service and have verified that the service and findings are accurately documented in the student's note.  Faith Rogue, DO 01/06/2023, 4:20 PM  01/06/2023, 3:56 PM  Pager: [phone]

## 2023-01-06 NOTE — ED Notes (Signed)
ED TO INPATIENT HANDOFF REPORT  ED Nurse Name and Phone #: [phone], Tori   S Name/Age/Gender Leon Rodriguez 57 y.o. male Room/Bed: RESUSC/RESUSC  Code Status   Code Status: Full Code  Home/SNF/Other Home Patient oriented to: self, place, time, and situation Is this baseline? Yes   Triage Complete: Triage complete  Chief Complaint Epididymo-orchitis [N45.3]  Triage Note Pt came in via POV d/t chills starting week & a half ago & then 2 days ago he started running a fever. With Tylenol & IBU q4h his fever returns & while in triage his Temp is 103. Also having body aches, & bil flank pain & his urine is dark orange & wife at bedside reports she sees sediment in it & there is a strong odor to his urine as well. Rt testicle was noted to be swollen yesterday morning as well. Also endorse decreased appetite, nausea & HA rates his pain 10/10 while in triage.     Allergies Not on File  Level of Care/Admitting Diagnosis ED Disposition     ED Disposition  Admit   Condition  --   Comment  Hospital Area: [facility] [[id number]]  Level of Care: Telemetry Medical [104]  May place patient in observation at [facility] or [facility] if equivalent level of care is available:: No  Covid Evaluation: Asymptomatic - no recent exposure (last 10 days) testing not required  Diagnosis: Epididymo-orchitis [784696]  Admitting Physician: Dickie La [[id number]]  Attending Physician: Dickie La [[id number]]          B Medical/Surgery History History reviewed. No pertinent past medical history. History reviewed. No pertinent surgical history.   A IV Location/Drains/Wounds Patient Lines/Drains/Airways Status     Active Line/Drains/Airways     Name Placement date Placement time Site Days   Peripheral IV 01/06/23 20 G 1.25" Anterior;Left;Proximal Forearm 01/06/23  1140  Forearm  less than 1   Peripheral IV 01/06/23 20 G Right Antecubital 01/06/23  1132  Antecubital  less than 1             Intake/Output Last 24 hours No intake or output data in the 24 hours ending 01/06/23 1331  Labs/Imaging Results for orders placed or performed during the hospital encounter of 01/06/23 (from the past 48 hour(s))  Urinalysis, Routine w reflex microscopic -Urine, Clean Catch     Status: Abnormal   Collection Time: 01/06/23 10:20 AM  Result Value Ref Range   Color, Urine AMBER (A) YELLOW    Comment: BIOCHEMICALS MAY BE AFFECTED BY COLOR   APPearance HAZY (A) CLEAR   Specific Gravity, Urine 1.021 1.005 - 1.030   pH 5.0 5.0 - 8.0   Glucose, UA NEGATIVE NEGATIVE mg/dL   Hgb urine dipstick SMALL (A) NEGATIVE   Bilirubin Urine SMALL (A) NEGATIVE   Ketones, ur 20 (A) NEGATIVE mg/dL   Protein, ur NEGATIVE NEGATIVE mg/dL   Nitrite POSITIVE (A) NEGATIVE   Leukocytes,Ua MODERATE (A) NEGATIVE   RBC / HPF 0-5 0 - 5 RBC/hpf   WBC, UA >50 0 - 5 WBC/hpf   Bacteria, UA MANY (A) NONE SEEN   Squamous Epithelial / HPF 0-5 0 - 5 /HPF   Mucus PRESENT    Hyaline Casts, UA PRESENT     Comment: Performed at [facility], [address], [place], [place] [address]  Comprehensive metabolic panel     Status: Abnormal   Collection Time: 01/06/23 10:27 AM  Result Value Ref Range   Sodium  134 (L) 135 - 145 mmol/L   Potassium 4.0 3.5 - 5.1 mmol/L   Chloride 96 (L) 98 - 111 mmol/L   CO2 23 22 - 32 mmol/L   Glucose, Bld 163 (H) 70 - 99 mg/dL    Comment: Glucose reference range applies only to samples taken after fasting for at least 8 hours.   BUN 7 6 - 20 mg/dL   Creatinine, Ser 4.54 0.61 - 1.24 mg/dL   Calcium 9.4 8.9 - 09.8 mg/dL   Total Protein 7.2 6.5 - 8.1 g/dL   Albumin 3.6 3.5 - 5.0 g/dL   AST 35 15 - 41 U/L   ALT 29 0 - 44 U/L   Alkaline Phosphatase 94 38 - 126 U/L   Total Bilirubin 0.8 0.3 - 1.2 mg/dL   GFR, Estimated >11 >91 mL/min    Comment: (NOTE) Calculated using the CKD-EPI Creatinine Equation (2021)    Anion gap 15 5 - 15    Comment: Performed at [facility], [address] [place], [place] [address]  CBC with Differential     Status: Abnormal   Collection Time: 01/06/23 10:27 AM  Result Value Ref Range   WBC 10.5 4.0 - 10.5 K/uL   RBC 4.69 4.22 - 5.81 MIL/uL   Hemoglobin 16.0 13.0 - 17.0 g/dL   HCT 56.2 13.0 - 86.5 %   MCV 95.1 80.0 - 100.0 fL   MCH 34.1 (H) 26.0 - 34.0 pg   MCHC 35.9 30.0 - 36.0 g/dL   RDW 78.4 69.6 - 29.5 %   Platelets 239 150 - 400 K/uL   nRBC 0.0 0.0 - 0.2 %   Neutrophils Relative % 84 %   Neutro Abs 8.8 (H) 1.7 - 7.7 K/uL   Lymphocytes Relative 9 %   Lymphs Abs 0.9 0.7 - 4.0 K/uL   Monocytes Relative 7 %   Monocytes Absolute 0.7 0.1 - 1.0 K/uL   Eosinophils Relative 0 %   Eosinophils Absolute 0.0 0.0 - 0.5 K/uL   Basophils Relative 0 %   Basophils Absolute 0.0 0.0 - 0.1 K/uL   Immature Granulocytes 0 %   Abs Immature Granulocytes 0.04 0.00 - 0.07 K/uL    Comment: Performed at [facility], [address], [place], [place] [address]  I-Stat Lactic Acid, ED     Status: None   Collection Time: 01/06/23 10:40 AM  Result Value Ref Range   Lactic Acid, Venous 1.1 0.5 - 1.9 mmol/L  Resp panel by RT-PCR (RSV, Flu A&B, Covid) Anterior Nasal Swab     Status: None   Collection Time: 01/06/23 10:46 AM   Specimen: Anterior Nasal Swab  Result Value Ref Range   SARS Coronavirus 2 by RT PCR NEGATIVE NEGATIVE   Influenza A by PCR NEGATIVE NEGATIVE   Influenza B by PCR NEGATIVE NEGATIVE    Comment: (NOTE) The Xpert Xpress SARS-CoV-2/FLU/RSV plus assay is intended as an aid in the diagnosis of influenza from Nasopharyngeal swab specimens and should not be used as a sole basis for treatment. Nasal washings and aspirates are unacceptable for Xpert Xpress SARS-CoV-2/FLU/RSV testing.  Fact Sheet for Patients: BloggerCourse.com  Fact Sheet for Healthcare Providers: SeriousBroker.it  This test is not yet approved or cleared by the [place] FDA and has  been authorized for detection and/or diagnosis of SARS-CoV-2 by FDA under an Emergency Use Authorization (EUA). This EUA will remain in effect (meaning this test can be used) for the duration of the  COVID-19 declaration under Section 564(b)(1) of the Act, 21 U.S.C. section 360bbb-3(b)(1), unless the authorization is terminated or revoked.     Resp Syncytial Virus by PCR NEGATIVE NEGATIVE    Comment: (NOTE) Fact Sheet for Patients: BloggerCourse.com  Fact Sheet for Healthcare Providers: SeriousBroker.it  This test is not yet approved or cleared by the [place] FDA and has been authorized for detection and/or diagnosis of SARS-CoV-2 by FDA under an Emergency Use Authorization (EUA). This EUA will remain in effect (meaning this test can be used) for the duration of the COVID-19 declaration under Section 564(b)(1) of the Act, 21 U.S.C. section 360bbb-3(b)(1), unless the authorization is terminated or revoked.  Performed at [facility], [address] [place], [place] [address]   Protime-INR     Status: None   Collection Time: 01/06/23 11:40 AM  Result Value Ref Range   Prothrombin Time 14.0 11.4 - 15.2 seconds   INR 1.1 0.8 - 1.2    Comment: (NOTE) INR goal varies based on device and disease states. Performed at [facility], [address] [place], [place] [address]   APTT     Status: None   Collection Time: 01/06/23 11:40 AM  Result Value Ref Range   aPTT 30 24 - 36 seconds    Comment: Performed at [facility], [address] [place], [place] [address]   US SCROTUM W/DOPPLER  Result Date: 01/06/2023 CLINICAL DATA:  Right testicular pain. EXAM: SCROTAL ULTRASOUND DOPPLER ULTRASOUND OF THE TESTICLES TECHNIQUE: Complete ultrasound examination of the testicles, epididymis, and other scrotal structures was performed. Color and spectral Doppler ultrasound were also utilized to evaluate blood flow to the  testicles. COMPARISON:  CT abdomen pelvis from same day. FINDINGS: Right testicle Measurements: 3.7 x 2.8 x 3.0 cm. Increased vascularity. No mass or microlithiasis visualized. Left testicle Measurements: 3.2 x 1.8 x 2.0 cm. No mass or microlithiasis visualized. Right epididymis:  Enlarged with increased vascularity. Left epididymis:  Normal in size and appearance. Hydrocele: Small complex right hydrocele with multiple septations. No left hydrocele. Varicocele:  None visualized. Pulsed Doppler interrogation of both testes demonstrates normal low resistance arterial and venous waveforms bilaterally. IMPRESSION: 1. Right epididymo-orchitis. 2. Small reactive complex right hydrocele. Electronically Signed   By: Obie Dredge M.D.   On: 01/06/2023 11:37   CT ABDOMEN PELVIS WO CONTRAST  Result Date: 01/06/2023 CLINICAL DATA:  Right testicular pain and back pain. EXAM: CT ABDOMEN AND PELVIS WITHOUT CONTRAST TECHNIQUE: Multidetector CT imaging of the abdomen and pelvis was performed following the standard protocol without IV contrast. RADIATION DOSE REDUCTION: This exam was performed according to the departmental dose-optimization program which includes automated exposure control, adjustment of the mA and/or kV according to patient size and/or use of iterative reconstruction technique. COMPARISON:  CT of the abdomen pelvis with contrast 10/30/2008 FINDINGS: Lower chest: Atelectasis or scarring is present at the right base. Lungs are otherwise clear. The heart size is normal. No significant pleural or pericardial effusion is present. Hepatobiliary: No focal liver abnormality is seen. No gallstones, gallbladder wall thickening, or biliary dilatation. Pancreas: Unremarkable. No pancreatic ductal dilatation or surrounding inflammatory changes. Spleen: Normal in size without focal abnormality. Adrenals/Urinary Tract: The adrenal glands are normal bilaterally. A 2 mm nonobstructing stone is present at the lower pole of the  left kidney. No right-sided stones are present. No mass lesion or cyst is present in either kidney. Ureters are within normal limits. No obstruction is present. The urinary  bladder is normal. Stomach/Bowel: The stomach and duodenum are within normal limits. Terminal ileum is normal. Appendectomy noted. The ascending and transverse colon are within normal limits. The descending and sigmoid colon are normal. Vascular/Lymphatic: No significant vascular findings are present. No enlarged abdominal or pelvic lymph nodes. Reproductive: Prostate is unremarkable. Seminal vesicles are within normal limits. Other: No abdominal wall hernia or abnormality. No abdominopelvic ascites. Musculoskeletal: The vertebral heights normal. Multilevel facet degenerative changes are present. No focal osseous lesions are present. Bony pelvis is within normal limits. Hips are located. Evidence of prior ORIF is noted bilaterally. Degenerative changes are present at both hips. IMPRESSION: 1. No acute or focal lesion to explain the patient's symptoms. 2. 2 mm nonobstructing stone at the lower pole of the left kidney. Electronically Signed   By: Marin Roberts M.D.   On: 01/06/2023 11:25   DG Chest 2 View  Result Date: 01/06/2023 CLINICAL DATA:  Fever.  Swollen testicle. EXAM: CHEST - 2 VIEW COMPARISON:  None Available. FINDINGS: The heart size and mediastinal contours are within normal limits. Both lungs are clear. The visualized skeletal structures are unremarkable. IMPRESSION: Negative two view chest x-ray Electronically Signed   By: Marin Roberts M.D.   On: 01/06/2023 11:01    Pending Labs Unresulted Labs (From admission, onward)     Start     Ordered   01/13/23 0500  Creatinine, serum  (enoxaparin (LOVENOX)    CrCl >/= 30 ml/min)  Weekly,   R     Comments: while on enoxaparin therapy    01/06/23 1258   01/07/23 0500  Comprehensive metabolic panel  Tomorrow morning,   R        01/06/23 1258   01/06/23 1300  CBC  with Differential/Platelet  Add-on,   AD        01/06/23 1259   01/06/23 1258  Hemoglobin A1c  Add-on,   AD        01/06/23 1258   01/06/23 1254  HIV Antibody (routine testing w rflx)  (HIV Antibody (Routine testing w reflex) panel)  Once,   R        01/06/23 1258   01/06/23 1254  CBC  (enoxaparin (LOVENOX)    CrCl >/= 30 ml/min)  Once,   R       Comments: Baseline for enoxaparin therapy IF NOT ALREADY DRAWN.  Notify MD if PLT < 100 K.    01/06/23 1258   01/06/23 1046  Blood Culture (routine x 2)  (Septic presentation on arrival (screening labs, nursing and treatment orders for obvious sepsis))  BLOOD CULTURE X 2,   STAT      01/06/23 1049   01/06/23 1046  Urinalysis, w/ Reflex to Culture (Infection Suspected) -Urine, Clean Catch  (Septic presentation on arrival (screening labs, nursing and treatment orders for obvious sepsis))  ONCE - URGENT,   URGENT       Question:  Specimen Source  Answer:  Urine, Clean Catch   01/06/23 1049   Pending  Urinalysis, Complete w Microscopic -Urine, Clean Catch  Once,   R       Question:  Specimen Source  Answer:  Urine, Clean Catch   Pending            Vitals/Pain Today's Vitals   01/06/23 1215 01/06/23 1230 01/06/23 1230 01/06/23 1245  BP: 117/69 118/65  118/68  Pulse: (!) 116 (!) 118  (!) 117  Resp: (!) 26 (!) 22  15  Temp:  TempSrc:      SpO2: 100% 100%  100%  Weight:      Height:      PainSc:   8      Isolation Precautions No active isolations  Medications Medications  lactated ringers infusion ( Intravenous Not Given 01/06/23 1232)  lactated ringers infusion ( Intravenous New Bag/Given 01/06/23 1323)  enoxaparin (LOVENOX) injection 40 mg (has no administration in time range)  polyethylene glycol (MIRALAX / GLYCOLAX) packet 17 g (has no administration in time range)  ceFEPIme (MAXIPIME) 2 g in sodium chloride 0.9 % 100 mL IVPB (0 g Intravenous Stopped 01/06/23 1223)  metroNIDAZOLE (FLAGYL) IVPB 500 mg (0 mg Intravenous Stopped  01/06/23 1310)  acetaminophen (TYLENOL) tablet 1,000 mg (1,000 mg Oral Given 01/06/23 1143)  lactated ringers bolus 1,000 mL (0 mLs Intravenous Stopped 01/06/23 1310)    And  lactated ringers bolus 1,000 mL (0 mLs Intravenous Stopped 01/06/23 1310)    And  lactated ringers bolus 1,000 mL (0 mLs Intravenous Stopped 01/06/23 1310)    And  lactated ringers bolus 500 mL (500 mLs Intravenous New Bag/Given 01/06/23 1240)    Mobility walks     Focused Assessments Sepsis workup    R Recommendations: See Admitting Provider Note  Report given to:   Additional Notes: axox4, room air. Spouse at bedside

## 2023-01-06 NOTE — ED Provider Notes (Signed)
[place] EMERGENCY DEPARTMENT AT [facility] Provider Note   CSN: [id number] Arrival date & time: 01/06/23  1004     History  Chief Complaint  Patient presents with   Fever   Swollen Testicle   Flank Pain   Strong Urine Odor    Leon Rodriguez is a 57 y.o. male presented with fever for 1 week and right testicle pain for the past day.  Patient states that he has been having temperature of 101 F at home but then yesterday began noticing swelling in his right testicle that is painful.  Patient states he is having flank pain as well bilaterally and that his urine appears dark and odorous.  Patient denies any significant past medical history.  Patient endorsed decreased appetite, nausea, headache.  Patient denies chest pain, shortness of breath, abdominal pain.  Patient is been taking Tylenol and ibuprofen at home which has been helping with the temperature.   Home Medications Prior to Admission medications   Medication Sig Start Date End Date Taking? Authorizing Provider  Ascorbic Acid (VITAMIN C PO) Take 1 tablet by mouth daily.   Yes [provider]  Aspirin-Acetaminophen-Caffeine (GOODYS EXTRA STRENGTH PO) Take 1 Package by mouth 2 (two) times daily as needed (Pain).   Yes [provider]  Boswellia-Glucosamine-Vit D (OSTEO BI-FLEX ONE PER DAY PO) Take 1 tablet by mouth daily.   Yes [provider]  Multiple Vitamin (MULTIVITAMIN PO) Take 1 tablet by mouth daily.   Yes [provider]  Multiple Vitamins-Minerals (ZINC PO) Take 1 tablet by mouth daily.   Yes [provider]  Turmeric (QC TUMERIC COMPLEX PO) Take 1 tablet by mouth daily.   Yes [provider]      Allergies    Patient has no allergy information on record.    Review of Systems   Review of Systems  Constitutional:  Positive for fever.  Genitourinary:  Positive for flank pain.    Physical Exam Updated Vital Signs BP 118/65   Pulse (!) 113    Temp (!) 100.4 F (38 C) (Temporal)   Resp (!) 23   Ht 5\' 10"  (1.778 m)   Wt 104.3 kg   SpO2 99%   BMI 33.00 kg/m  Physical Exam Constitutional:      Appearance: He is ill-appearing and toxic-appearing.  Cardiovascular:     Rate and Rhythm: Regular rhythm. Tachycardia present.     Pulses: Normal pulses.     Comments: 2+ bilateral radial pulses with increased rate Abdominal:     Palpations: Abdomen is soft.     Tenderness: There is no abdominal tenderness. There is no guarding or rebound.  Genitourinary:    Comments: Chaperone: Kristine Royal, MD Edema and erythema noted to right testicle that was extremely tender to palpation No peritoneum erythema/edema noted No signs of skin color changes indicative of Fournier's or necrotizing fasciitis Musculoskeletal:        General: Normal range of motion.     Cervical back: Normal range of motion. No rigidity.  Skin:    General: Skin is warm and dry.     Capillary Refill: Capillary refill takes less than 2 seconds.  Neurological:     General: No focal deficit present.     Mental Status: He is alert.  Psychiatric:        Mood and Affect: Mood normal.     ED Results / Procedures / Treatments   Labs (all labs ordered are listed, but  only abnormal results are displayed) Labs Reviewed  COMPREHENSIVE METABOLIC PANEL - Abnormal; Notable for the following components:      Result Value   Sodium 134 (*)    Chloride 96 (*)    Glucose, Bld 163 (*)    All other components within normal limits  CBC WITH DIFFERENTIAL/PLATELET - Abnormal; Notable for the following components:   MCH 34.1 (*)    Neutro Abs 8.8 (*)    All other components within normal limits  URINALYSIS, ROUTINE W REFLEX MICROSCOPIC - Abnormal; Notable for the following components:   Color, Urine AMBER (*)    APPearance HAZY (*)    Hgb urine dipstick SMALL (*)    Bilirubin Urine SMALL (*)    Ketones, ur 20 (*)    Nitrite POSITIVE (*)    Leukocytes,Ua MODERATE (*)     Bacteria, UA MANY (*)    All other components within normal limits  RESP PANEL BY RT-PCR (RSV, FLU A&B, COVID)  RVPGX2  CULTURE, BLOOD (ROUTINE X 2)  CULTURE, BLOOD (ROUTINE X 2)  PROTIME-INR  APTT  URINALYSIS, W/ REFLEX TO CULTURE (INFECTION SUSPECTED)  I-STAT CG4 LACTIC ACID, ED  I-STAT CG4 LACTIC ACID, ED    EKG None  Radiology US SCROTUM W/DOPPLER  Result Date: 01/06/2023 CLINICAL DATA:  Right testicular pain. EXAM: SCROTAL ULTRASOUND DOPPLER ULTRASOUND OF THE TESTICLES TECHNIQUE: Complete ultrasound examination of the testicles, epididymis, and other scrotal structures was performed. Color and spectral Doppler ultrasound were also utilized to evaluate blood flow to the testicles. COMPARISON:  CT abdomen pelvis from same day. FINDINGS: Right testicle Measurements: 3.7 x 2.8 x 3.0 cm. Increased vascularity. No mass or microlithiasis visualized. Left testicle Measurements: 3.2 x 1.8 x 2.0 cm. No mass or microlithiasis visualized. Right epididymis:  Enlarged with increased vascularity. Left epididymis:  Normal in size and appearance. Hydrocele: Small complex right hydrocele with multiple septations. No left hydrocele. Varicocele:  None visualized. Pulsed Doppler interrogation of both testes demonstrates normal low resistance arterial and venous waveforms bilaterally. IMPRESSION: 1. Right epididymo-orchitis. 2. Small reactive complex right hydrocele. Electronically Signed   By: Obie Dredge M.D.   On: 01/06/2023 11:37   CT ABDOMEN PELVIS WO CONTRAST  Result Date: 01/06/2023 CLINICAL DATA:  Right testicular pain and back pain. EXAM: CT ABDOMEN AND PELVIS WITHOUT CONTRAST TECHNIQUE: Multidetector CT imaging of the abdomen and pelvis was performed following the standard protocol without IV contrast. RADIATION DOSE REDUCTION: This exam was performed according to the departmental dose-optimization program which includes automated exposure control, adjustment of the mA and/or kV according to  patient size and/or use of iterative reconstruction technique. COMPARISON:  CT of the abdomen pelvis with contrast 10/30/2008 FINDINGS: Lower chest: Atelectasis or scarring is present at the right base. Lungs are otherwise clear. The heart size is normal. No significant pleural or pericardial effusion is present. Hepatobiliary: No focal liver abnormality is seen. No gallstones, gallbladder wall thickening, or biliary dilatation. Pancreas: Unremarkable. No pancreatic ductal dilatation or surrounding inflammatory changes. Spleen: Normal in size without focal abnormality. Adrenals/Urinary Tract: The adrenal glands are normal bilaterally. A 2 mm nonobstructing stone is present at the lower pole of the left kidney. No right-sided stones are present. No mass lesion or cyst is present in either kidney. Ureters are within normal limits. No obstruction is present. The urinary bladder is normal. Stomach/Bowel: The stomach and duodenum are within normal limits. Terminal ileum is normal. Appendectomy noted. The ascending and transverse colon are within normal limits. The  descending and sigmoid colon are normal. Vascular/Lymphatic: No significant vascular findings are present. No enlarged abdominal or pelvic lymph nodes. Reproductive: Prostate is unremarkable. Seminal vesicles are within normal limits. Other: No abdominal wall hernia or abnormality. No abdominopelvic ascites. Musculoskeletal: The vertebral heights normal. Multilevel facet degenerative changes are present. No focal osseous lesions are present. Bony pelvis is within normal limits. Hips are located. Evidence of prior ORIF is noted bilaterally. Degenerative changes are present at both hips. IMPRESSION: 1. No acute or focal lesion to explain the patient's symptoms. 2. 2 mm nonobstructing stone at the lower pole of the left kidney. Electronically Signed   By: Marin Roberts M.D.   On: 01/06/2023 11:25   DG Chest 2 View  Result Date: 01/06/2023 CLINICAL DATA:   Fever.  Swollen testicle. EXAM: CHEST - 2 VIEW COMPARISON:  None Available. FINDINGS: The heart size and mediastinal contours are within normal limits. Both lungs are clear. The visualized skeletal structures are unremarkable. IMPRESSION: Negative two view chest x-ray Electronically Signed   By: Marin Roberts M.D.   On: 01/06/2023 11:01    Procedures .Critical Care  Performed by: Netta Corrigan, PA-C Authorized by: Netta Corrigan, PA-C   Critical care provider statement:    Critical care time (minutes):  30   Critical care time was exclusive of:  Separately billable procedures and treating other patients   Critical care was necessary to treat or prevent imminent or life-threatening deterioration of the following conditions:  Sepsis   Critical care was time spent personally by me on the following activities:  Development of treatment plan with patient or surrogate, discussions with consultants, blood draw for specimens, evaluation of patient's response to treatment, examination of patient, obtaining history from patient or surrogate, review of old charts, re-evaluation of patient's condition, pulse oximetry, ordering and review of radiographic studies, ordering and review of laboratory studies and ordering and performing treatments and interventions   I assumed direction of critical care for this patient from another provider in my specialty: no     Care discussed with: admitting provider       Medications Ordered in ED Medications  lactated ringers infusion (has no administration in time range)  metroNIDAZOLE (FLAGYL) IVPB 500 mg (500 mg Intravenous New Bag/Given 01/06/23 1152)  vancomycin (VANCOREADY) IVPB 2000 mg/400 mL (2,000 mg Intravenous New Bag/Given 01/06/23 1147)  lactated ringers infusion (has no administration in time range)  lactated ringers bolus 1,000 mL (1,000 mLs Intravenous New Bag/Given 01/06/23 1144)    And  lactated ringers bolus 1,000 mL (1,000 mLs Intravenous  New Bag/Given 01/06/23 1155)    And  lactated ringers bolus 1,000 mL (1,000 mLs Intravenous New Bag/Given 01/06/23 1201)    And  lactated ringers bolus 500 mL (has no administration in time range)  ceFEPIme (MAXIPIME) 2 g in sodium chloride 0.9 % 100 mL IVPB (0 g Intravenous Stopped 01/06/23 1223)  acetaminophen (TYLENOL) tablet 1,000 mg (1,000 mg Oral Given 01/06/23 1143)    ED Course/ Medical Decision Making/ A&P                             Medical Decision Making Amount and/or Complexity of Data Reviewed Labs: ordered. Radiology: ordered. ECG/medicine tests: ordered.  Risk OTC drugs. Prescription drug management.   Donnamarie Rossetti 57 y.o. presented today for sepsis, testicle pain. Working DDx that I considered at this time includes, but not limited to, testicular torsion, epididymitis,  orchitis, pelvic abscess, pyelonephritis, septic nephrolithiasis, sepsis, electrolyte imbalance, UTI, anemia, dehydration, pneumonia, viral illness.  R/o DDx: testicular torsion, pelvic abscess, pyelonephritis, septic nephrolithiasis, electrolyte imbalance, anemia, dehydration, pneumonia, viral illness: These are considered less likely due to history of present illness and physical exam findings  Review of prior external notes: None  Unique Tests and My Interpretation:  Chest x-ray: Negative Scrotal ultrasound: Right epididymoorchitis, small reactive hydrocele CT abdomen pelvis: 2 mm nonobstructing nephrolithiasis on lower pole left kidney Chest x-ray: Unremarkable EKG: Sinus tachycardia 119 bpm, left ear fascicular block, no signs of ischemia noted UA: Moderate leukocytes, nitrate positive, many bacteria CBC: Unremarkable CMP: Unremarkable Lactic acid: 1.1 Respiratory panel: Pending aPTT:Pending PT/INR:Pending Blood cultures: Pending  Discussion with Independent Historian:  Wife  Discussion of Management of Tests:  Gomez-Caraballo, MD IM  Risk: High: hospitalization or escalation of  hospital-level care  Risk Stratification Score: none  Staffed with Messick, MD  Plan: On exam patient was septic on arrival with tachycardia and temp of 103 F.  With a chaperone in the room a GU exam was performed which shows edema and edema to right testicle and right testicle was tender to palpation.  No signs of abnormalities in patient's perineum.  Patient had unremarkable abdominal exam and no peritoneal signs.  Due to patient's sepsis presentation sepsis protocol was initiated and empiric antibiotics were ordered.  Patient was endorsing flank pain bilaterally and so CT abdomen pelvis without contrast was ordered to evaluate for possible pyelonephritis.  Ultrasound was ordered as well due to patient's tenderness and erythema on exam.  Ultrasound came back showing right epididymoorchitis with mild hydrocele most likely causing patient's symptoms.  Patient's urine came back suspicious of UTI as well.  CT scan was ultimately unremarkable.  On recheck after seeing the Tylenol patient had temp of 100.4 F but continues to endorse discomfort and chills.  At this time patient would benefit from admission due to his epididymoorchitis and UTI causing him to be septic.  Hospitalist consulted.  Patient stable for admission at this time.  I spoke to the hospitalist and patient has been accepted for admission.  Patient stable for admission at this time.         Final Clinical Impression(s) / ED Diagnoses Final diagnoses:  Sepsis without acute organ dysfunction, due to unspecified organism [facility])  Epididymoorchitis    Rx / DC Orders ED Discharge Orders     None         Remi Deter 01/06/23 1230    Wynetta Fines, MD 01/06/23 1359

## 2023-01-06 NOTE — Progress Notes (Signed)
Pharmacy Antibiotic Note  Leon Rodriguez is a 57 y.o. male admitted on 01/06/2023 with UTI and epididymo-orchitis. Pharmacy has been consulted for Cefepime dosing.  Patient received Cefepime 2g IV x1, Metronidazole 500mg  IV x1, and Vancomycin 2g IV x1 in the ED previously.  WBC 10.5, Temp 100.4 SCr 1.01  Plan: Continue Cefepime 2g IV q8h  Monitor daily CBC, temp, SCr, and for clinical signs of improvement  F/u cultures and de-escalate antibiotics as able   Height: 5\' 10"  (177.8 cm) Weight: 104.3 kg (230 lb) IBW/kg (Calculated) : 73  Temp (24hrs), Avg:101.7 F (38.7 C), Min:100.4 F (38 C), Max:103 F (39.4 C)  Recent Labs  Lab 01/06/23 1027 01/06/23 1040  WBC 10.5  --   CREATININE 1.01  --   LATICACIDVEN  --  1.1    Estimated Creatinine Clearance: 96.4 mL/min (by C-G formula based on SCr of 1.01 mg/dL).    Not on File  Antimicrobials this admission: Cefepime 7/14 >>  Vancomycin 7/14 >>  Metronidazole 7/14 >>   Dose adjustments this admission: N/A  Microbiology results: 7/14 BCx: sent 7/14 UCx: sent     Thank you for allowing pharmacy to be a part of this patient's care.  Wilburn Cornelia, PharmD, BCPS Clinical Pharmacist 01/06/2023 1:46 PM   Please refer to AMION for pharmacy phone number

## 2023-01-06 NOTE — Hospital Course (Addendum)
Epididymo-orchitis  Pyuria Hydrocele Patient presented to the emergency department with severe right testicular pain, tachycardia, tachypnea, and a fever. An ultrasound was negative for testicular torsion but did show a septate hydrocele and epididymo-orchitis. He had positive nitrites, bacteria, and pyuria on urinalysis. No evidence of pyelonephritis on CT AP. Suspect symptoms due to infection, either stemming from urinary system with possible BPH, STI, or recurrent epididymitis. Most common bugs leading to infection in pts > 35yo are E coli, Pseudomonas, other coliforms; also consider STI organisms. Pt received cefepime, flagyl, and vancomycin in the ED, and was then transitioned to IV cefepime. Also received fluids, Tylenol. He was then transitioned to po levofloxacin 500 mg daily on 7/15 to complete a total 10 day course. Due to SIRS criteria, blood cultures are pending but show no growth to date. Urine cultures show no growth. RPR, GC/Chlamydia pending, HIV negative. Continuing empiric treatment for infection with levofloxacin at discharge.  Possible BPH Pt reported 2 years of difficulty stopping and starting urinary stream along with with increased nighttime awakenings to urinate. Prostate was normal size on CT AP done 7/14. However, given strong history of BPH symptoms, consider BPH leading to urinary retention and increased risk of infection as etiology behind epididymo-orchitis. Symptoms could also be related to intermittent episodes of chronic epididymitis. Pt will follow with outpatient urology for further management and workup.   Ketonuria Originally presented with ketonuria which resolved after fluid administration. Ha1c on admission was 5.6% with Glu of 163, no concern for DKA. Etiology likely due to starvation and decreased oral intake in the last few days. No further workup indicated.  Nonobstructive 2mm nephrolithiasis, left CT AP done at admission showed a nonobstructive 2mm  nephrolithiasis on the lower left pole of the kidney. No evidence of pyelonephritis. Given stone is nonobstructing, nothing further to do.  ___   Mr. Leon Rodriguez,  It was a pleasure caring for you during your hospitalization! You came to the hospital due to fevers and right testicular pain, and was found to have an infection of your right testicle, called epididymo-orchitis. You were treated for this with IV antibiotics, which were then switched to oral antibiotics when you were ready to discharge. We are still waiting on some of the results to come back from tests you had done while hospitalized; these include blood and urine cultures to see what bacteria might have caused your infection, as well as some additional STI testing. You will be able to see these results in your MyChart, which we recommend you set up.  There are a few things for you to do after leaving the hospital: Follow-up with the Internal Medicine outpatient clinic, located in the basement floor of [facility]. Your appointment is on Wed 01/16/2023 at 1:15pm. Continue taking your oral antibiotic, fluoroquinolone 500 mg daily starting tomorrow 01/08/23, with your last dose to be taken on 7/23. We will let you know if this medication needs to change. You can take Tylenol 1000 mg every 8 hours for pain Do NOT take more than 4,000 mg of tylenol in 24 hours. We will also send you with 4 doses of oxycodone to take as needed every 8 hours for severe pain. You will need to follow up with urology to discuss ways to prevent infection from happening again; your outpatient Internal Medicine provider can help set this up for you.  We are glad you are feeling better! Please call the Internal Medicine clinic triage number at [phone] if you start to experience  high fevers again or increased testicular pain. If you cannot get in contact with the clinic, please return to the hospital to be evaluated.  Thank you for trusting [place] with your  care! Governor Rooks, Medical Student Dr. Faith Rogue, PGY1 Dr. Monico Hoar, PGY2  -----  July 15 Feels better, yesterday felt out of it, sweat a lot overnight, wasn't sure if it was  a fever or not, testicular pain 4/10, still swollen to the same size but doesn't hurt as much, no SOB, no N/V, no BM yesterday (had one day before coming to the hospital), no difficulty urinating- improved from yesterday  Discussed d/c f/u visit at [facility]  Confirmed that he does not have health insurance PE: less swollen compared to yesterday, firm  Discussed plan to discharge today and reasons for coming back

## 2023-01-06 NOTE — ED Notes (Signed)
Patient transported to CT 

## 2023-01-06 NOTE — Progress Notes (Signed)
ED Pharmacy Antibiotic Sign Off An antibiotic consult was received from an ED provider for Cefepime and Vancomycin per pharmacy dosing for sepsis (unknown source). A chart review was completed to assess appropriateness.   The following one time order(s) were placed:  Cefepime 2g IV x1 Vancomycin 2g IV x1  Further antibiotic and/or antibiotic pharmacy consults should be ordered by the admitting provider if indicated.   Thank you for allowing pharmacy to be a part of this patient's care.   Wilburn Cornelia, PharmD, BCPS Clinical Pharmacist 01/06/2023 11:17 AM   Please refer to AMION for pharmacy phone number

## 2023-01-06 NOTE — ED Triage Notes (Signed)
Pt came in via POV d/t chills starting week & a half ago & then 2 days ago he started running a fever. With Tylenol & IBU q4h his fever returns & while in triage his Temp is 103. Also having body aches, & bil flank pain & his urine is dark orange & wife at bedside reports she sees sediment in it & there is a strong odor to his urine as well. Rt testicle was noted to be swollen yesterday morning as well. Also endorse decreased appetite, nausea & HA rates his pain 10/10 while in triage.

## 2023-01-07 ENCOUNTER — Other Ambulatory Visit (HOSPITAL_COMMUNITY): Payer: Self-pay

## 2023-01-07 ENCOUNTER — Other Ambulatory Visit: Payer: Self-pay | Admitting: Student

## 2023-01-07 DIAGNOSIS — N453 Epididymo-orchitis: Secondary | ICD-10-CM

## 2023-01-07 DIAGNOSIS — N433 Hydrocele, unspecified: Secondary | ICD-10-CM | POA: Insufficient documentation

## 2023-01-07 LAB — COMPREHENSIVE METABOLIC PANEL
ALT: 28 U/L (ref 0–44)
AST: 27 U/L (ref 15–41)
Albumin: 2.7 g/dL — ABNORMAL LOW (ref 3.5–5.0)
Alkaline Phosphatase: 72 U/L (ref 38–126)
Anion gap: 9 (ref 5–15)
BUN: 7 mg/dL (ref 6–20)
CO2: 23 mmol/L (ref 22–32)
Calcium: 8.8 mg/dL — ABNORMAL LOW (ref 8.9–10.3)
Chloride: 104 mmol/L (ref 98–111)
Creatinine, Ser: 0.88 mg/dL (ref 0.61–1.24)
GFR, Estimated: >60 mL/min (ref >60)
Glucose, Bld: 102 mg/dL — ABNORMAL HIGH (ref 70–99)
Potassium: 3.9 mmol/L (ref 3.5–5.1)
Sodium: 136 mmol/L (ref 135–145)
Total Bilirubin: 0.5 mg/dL (ref 0.3–1.2)
Total Protein: 5.8 g/dL — ABNORMAL LOW (ref 6.5–8.1)

## 2023-01-07 LAB — CBC WITH DIFFERENTIAL/PLATELET
Abs Immature Granulocytes: 0.04 10*3/uL (ref 0.00–0.07)
Basophils Absolute: 0 10*3/uL (ref 0.0–0.1)
Basophils Relative: 1 %
Eosinophils Absolute: 0 10*3/uL (ref 0.0–0.5)
Eosinophils Relative: 0 %
HCT: 40.5 % (ref 39.0–52.0)
Hemoglobin: 14.1 g/dL (ref 13.0–17.0)
Immature Granulocytes: 1 %
Lymphocytes Relative: 23 %
Lymphs Abs: 2 10*3/uL (ref 0.7–4.0)
MCH: 33.6 pg (ref 26.0–34.0)
MCHC: 34.8 g/dL (ref 30.0–36.0)
MCV: 96.4 fL (ref 80.0–100.0)
Monocytes Absolute: 0.9 10*3/uL (ref 0.1–1.0)
Monocytes Relative: 10 %
Neutro Abs: 5.9 10*3/uL (ref 1.7–7.7)
Neutrophils Relative %: 65 %
Platelets: 189 10*3/uL (ref 150–400)
RBC: 4.2 MIL/uL — ABNORMAL LOW (ref 4.22–5.81)
RDW: 12.4 % (ref 11.5–15.5)
WBC: 8.8 10*3/uL (ref 4.0–10.5)
nRBC: 0 % (ref 0.0–0.2)

## 2023-01-07 LAB — URINE CULTURE: Culture: NO GROWTH

## 2023-01-07 LAB — RPR: RPR Ser Ql: NONREACTIVE

## 2023-01-07 MED ORDER — LEVOFLOXACIN 500 MG PO TABS
500.0000 mg | ORAL_TABLET | Freq: Every day | ORAL | 0 refills | Status: AC
  Filled 2023-01-07: qty 8, 8d supply, fill #0

## 2023-01-07 MED ORDER — ACETAMINOPHEN 500 MG PO TABS
1000.0000 mg | ORAL_TABLET | Freq: Three times a day (TID) | ORAL | 0 refills | Status: DC | PRN
  Filled 2023-01-07: qty 30, 5d supply, fill #0

## 2023-01-07 MED ORDER — LEVOFLOXACIN 500 MG PO TABS
500.0000 mg | ORAL_TABLET | Freq: Every day | ORAL | Status: DC
  Administered 2023-01-07: 500 mg via ORAL
  Filled 2023-01-07: qty 1

## 2023-01-07 MED ORDER — OXYCODONE HCL 5 MG PO TABS
5.0000 mg | ORAL_TABLET | Freq: Three times a day (TID) | ORAL | 0 refills | Status: DC | PRN
  Filled 2023-01-07: qty 4, 2d supply, fill #0

## 2023-01-07 NOTE — Plan of Care (Signed)

## 2023-01-07 NOTE — Discharge Summary (Signed)
Name: Leon Rodriguez MRN: [id number] DOB: [birth year] 57 y.o. PCP: Pcp, No  Date of Admission: 01/06/2023 10:11 AM Date of Discharge: 01/07/2023 12:21 PM Attending Physician: Dr. Antony Contras  Discharge Diagnosis: Principal Problem:   Epididymo-orchitis    Discharge Medications: Allergies as of 01/07/2023   Not on File      Medication List     TAKE these medications    acetaminophen 500 MG tablet Commonly known as: TYLENOL Take 2 tablets (1,000 mg total) by mouth every 8 (eight) hours as needed for fever.   GOODYS EXTRA STRENGTH PO Take 1 Package by mouth 2 (two) times daily as needed (Pain).   levofloxacin 500 MG tablet Commonly known as: LEVAQUIN Take 1 tablet (500 mg total) by mouth daily for 8 days. Start taking on: January 08, 2023   MULTIVITAMIN PO Take 1 tablet by mouth daily.   OSTEO BI-FLEX ONE PER DAY PO Take 1 tablet by mouth daily.   oxyCODONE 5 MG immediate release tablet Commonly known as: Roxicodone Take 1 tablet (5 mg total) by mouth every 8 (eight) hours as needed for severe pain.   QC TUMERIC COMPLEX PO Take 1 tablet by mouth daily.   VITAMIN C PO Take 1 tablet by mouth daily.   ZINC PO Take 1 tablet by mouth daily.        Disposition and follow-up:   Leon Rodriguez was discharged from [facility] in Stable condition.  At the hospital follow up visit please address:  1.  Follow-up:  *Epididymo-orchitis -Ensure adherence to antibiotic therapy until 01/15/2023 -Monitor for signs of improvement -Follow up urine culture and RPR, gonorrhea, and chlamydia tests -Follow up blood cultures  *Erectile dysfunction -Discuss psychosocial stressors and other functional etiologies  *Polysubstance use  -Continue counseling at follow up  *SDoH -[place] card application -Patient wishes to establish care at [facility]  Follow-up Appointments: [facility] - 01/16/2023 - Dr. Kathleen Lime at [facility] Course by problem  list: Epididymo-orchitis  Pyuria Hydrocele Patient presented to the emergency department with severe right testicular pain, tachycardia, tachypnea, and a fever. An ultrasound was negative for testicular torsion but did show a septate hydrocele and epididymo-orchitis. He had positive nitrites, bacteria, and pyuria on urinalysis. No evidence of pyelonephritis on CT AP. Suspect symptoms due to infection, either stemming from urinary system with possible BPH, STI, or recurrent epididymitis. Most common infectious etiologies stem from E coli, Pseudomonas, chlamydia and gonorrhea.  Pt received cefepime, flagyl, and vancomycin in the ED, and was then transitioned to IV cefepime. Sepsis was ruled out as there was no overt signs of end organ damage.  He was then transitioned to po levofloxacin 500 mg daily on 7/15 to complete a total 10 day course. Blood and urine cultures are pending. RPR, GC/Chlamydia pending, HIV negative. Continuing empiric treatment for infection with levofloxacin at discharge for a total of 10 days. Patient is to be monitored at follow up at [facility]. Consider outpatient referral to urology if symptoms fail to improve or worsen. Precautionary symptoms that would warrant medical evaluation were reviewed with the patient. He is now stable to discharge with a short course of OTC pain medication and opioid medications.   Possible BPH Pt reported 2 years of difficulty stopping and starting urinary stream along with with increased nighttime awakenings to urinate. Prostate was normal size on CT AP done 7/14. However, given strong history of BPH symptoms, consider BPH leading to urinary retention and increased  risk of infection as etiology behind epididymo-orchitis. Symptoms could also be related to intermittent episodes of chronic epididymitis. Pt will follow with outpatient urology for further management and workup.   Ketonuria Originally presented with ketonuria which resolved  after fluid administration. Ha1c on admission was 5.6% with Glu of 163, no concern for DKA. Etiology likely due to starvation and decreased oral intake in the last few days. No further workup indicated.  Nonobstructive 2mm nephrolithiasis, left CT AP done at admission showed a nonobstructive 2mm nephrolithiasis on the lower left pole of the kidney. No evidence of pyelonephritis. Given stone is nonobstructing, nothing further to do.  Polysubstance use Continue to advice smoking and THC use cessation as well as alcohol use in moderation  SDoH Patient is currently uninsured and would benefit from application to [employer] and other assistance services  Discharge Subjective: Feels better, yesterday felt out of it, sweat a lot overnight, wasn't sure if it was  a fever or not, testicular pain 4/10, still swollen to the same size but doesn't hurt as much, no SOB, no N/V, no BM yesterday (had one day before coming to the hospital), no difficulty urinating- improved from yesterday  Discharge Exam:   Blood pressure 130/82, pulse 74, temperature 98.2 F (36.8 C), resp. rate 18, height 5\' 10"  (1.778 m), weight 104.3 kg, SpO2 99%.  Constitutional:well-appearing man sitting in bed, in no acute distress HENT: normocephalic atraumatic, mucous membranes moist Cardiovascular: regular rate and rhythm Pulmonary/Chest: normal work of breathing on room air, lungs clear to auscultation bilaterally Abdominal: soft, non-tender, non-distended Neurological: alert & oriented x 3 MSK: no gross abnormalities.  Skin: warm and dry Psych: Normal mood and affect GU: Mild tenderness to palpation to posterior R testes with decreased inflammation and firmness. No urethral discharge. No surrounding erythema or crepitus.  Pertinent Labs, Studies, and Procedures:     Latest Ref Rng & Units 01/07/2023    1:45 AM 01/06/2023   10:27 AM 11/09/2008   12:58 PM  CBC  WBC 4.0 - 10.5 K/uL 8.8  10.5  7.9   Hemoglobin 13.0 - 17.0  g/dL 16.1  09.6  04.5   Hematocrit 39.0 - 52.0 % 40.5  44.6  44.6   Platelets 150 - 400 K/uL 189  239  198        Latest Ref Rng & Units 01/07/2023    1:45 AM 01/06/2023   10:27 AM 11/09/2008   12:58 PM  CMP  Glucose 70 - 99 mg/dL 409  811  93   BUN 6 - 20 mg/dL 7  7  8    Creatinine 0.61 - 1.24 mg/dL 9.14  7.82  9.56   Sodium 135 - 145 mmol/L 136  134  140   Potassium 3.5 - 5.1 mmol/L 3.9  4.0  5.0   Chloride 98 - 111 mmol/L 104  96  107   CO2 22 - 32 mmol/L 23  23  28    Calcium 8.9 - 10.3 mg/dL 8.8  9.4  9.9   Total Protein 6.5 - 8.1 g/dL 5.8  7.2    Total Bilirubin 0.3 - 1.2 mg/dL 0.5  0.8    Alkaline Phos 38 - 126 U/L 72  94    AST 15 - 41 U/L 27  35    ALT 0 - 44 U/L 28  29      US SCROTUM W/DOPPLER  Result Date: 01/06/2023 CLINICAL DATA:  Right testicular pain. EXAM: SCROTAL ULTRASOUND DOPPLER ULTRASOUND OF THE TESTICLES  TECHNIQUE: Complete ultrasound examination of the testicles, epididymis, and other scrotal structures was performed. Color and spectral Doppler ultrasound were also utilized to evaluate blood flow to the testicles. COMPARISON:  CT abdomen pelvis from same day. FINDINGS: Right testicle Measurements: 3.7 x 2.8 x 3.0 cm. Increased vascularity. No mass or microlithiasis visualized. Left testicle Measurements: 3.2 x 1.8 x 2.0 cm. No mass or microlithiasis visualized. Right epididymis:  Enlarged with increased vascularity. Left epididymis:  Normal in size and appearance. Hydrocele: Small complex right hydrocele with multiple septations. No left hydrocele. Varicocele:  None visualized. Pulsed Doppler interrogation of both testes demonstrates normal low resistance arterial and venous waveforms bilaterally. IMPRESSION: 1. Right epididymo-orchitis. 2. Small reactive complex right hydrocele. Electronically Signed   By: Obie Dredge M.D.   On: 01/06/2023 11:37   CT ABDOMEN PELVIS WO CONTRAST  Result Date: 01/06/2023 CLINICAL DATA:  Right testicular pain and back pain. EXAM:  CT ABDOMEN AND PELVIS WITHOUT CONTRAST TECHNIQUE: Multidetector CT imaging of the abdomen and pelvis was performed following the standard protocol without IV contrast. RADIATION DOSE REDUCTION: This exam was performed according to the departmental dose-optimization program which includes automated exposure control, adjustment of the mA and/or kV according to patient size and/or use of iterative reconstruction technique. COMPARISON:  CT of the abdomen pelvis with contrast 10/30/2008 FINDINGS: Lower chest: Atelectasis or scarring is present at the right base. Lungs are otherwise clear. The heart size is normal. No significant pleural or pericardial effusion is present. Hepatobiliary: No focal liver abnormality is seen. No gallstones, gallbladder wall thickening, or biliary dilatation. Pancreas: Unremarkable. No pancreatic ductal dilatation or surrounding inflammatory changes. Spleen: Normal in size without focal abnormality. Adrenals/Urinary Tract: The adrenal glands are normal bilaterally. A 2 mm nonobstructing stone is present at the lower pole of the left kidney. No right-sided stones are present. No mass lesion or cyst is present in either kidney. Ureters are within normal limits. No obstruction is present. The urinary bladder is normal. Stomach/Bowel: The stomach and duodenum are within normal limits. Terminal ileum is normal. Appendectomy noted. The ascending and transverse colon are within normal limits. The descending and sigmoid colon are normal. Vascular/Lymphatic: No significant vascular findings are present. No enlarged abdominal or pelvic lymph nodes. Reproductive: Prostate is unremarkable. Seminal vesicles are within normal limits. Other: No abdominal wall hernia or abnormality. No abdominopelvic ascites. Musculoskeletal: The vertebral heights normal. Multilevel facet degenerative changes are present. No focal osseous lesions are present. Bony pelvis is within normal limits. Hips are located. Evidence of  prior ORIF is noted bilaterally. Degenerative changes are present at both hips. IMPRESSION: 1. No acute or focal lesion to explain the patient's symptoms. 2. 2 mm nonobstructing stone at the lower pole of the left kidney. Electronically Signed   By: Marin Roberts M.D.   On: 01/06/2023 11:25   DG Chest 2 View  Result Date: 01/06/2023 CLINICAL DATA:  Fever.  Swollen testicle. EXAM: CHEST - 2 VIEW COMPARISON:  None Available. FINDINGS: The heart size and mediastinal contours are within normal limits. Both lungs are clear. The visualized skeletal structures are unremarkable. IMPRESSION: Negative two view chest x-ray Electronically Signed   By: Marin Roberts M.D.   On: 01/06/2023 11:01     Discharge Instructions: Discharge Instructions     Call MD for:  difficulty breathing, headache or visual disturbances   Complete by: As directed    Call MD for:  extreme fatigue   Complete by: As directed    Call MD  for:  hives   Complete by: As directed    Call MD for:  persistant dizziness or light-headedness   Complete by: As directed    Call MD for:  persistant nausea and vomiting   Complete by: As directed    Call MD for:  redness, tenderness, or signs of infection (pain, swelling, redness, odor or green/yellow discharge around incision site)   Complete by: As directed    Call MD for:  severe uncontrolled pain   Complete by: As directed    Call MD for:  temperature >100.4   Complete by: As directed    Diet - low sodium heart healthy   Complete by: As directed    Discharge instructions   Complete by: As directed    Leon Rodriguez,  It was a pleasure caring for you during your hospitalization! You came to the hospital due to fevers and right testicular pain, and was found to have an infection of your right testicle, called epididymo-orchitis. You were treated for this with IV antibiotics, which were then switched to oral antibiotics when you were ready to discharge. We are still waiting on  some of the results to come back from tests you had done while hospitalized; these include blood and urine cultures to see what bacteria might have caused your infection, as well as some additional STI testing. You will be able to see these results in your MyChart, which we recommend you set up.  There are a few things for you to do after leaving the hospital: 1. Follow-up with the Internal Medicine outpatient clinic, located in the basement floor of [facility]. Your appointment is on Wed 01/16/2023 at 1:15pm. 2. Continue taking your oral antibiotic, fluoroquinolone 500 mg daily starting tomorrow 01/08/23, with your last dose to be taken on 7/23. We will let you know if this medication needs to change. 3. You can take Tylenol 1000 mg every 8 hours for pain Do NOT take more than 4,000 mg of tylenol in 24 hours. We will also send you with 4 doses of oxycodone to take as needed every 8 hours for severe pain. 4. You will need to follow up with urology to discuss ways to prevent infection from happening again; your outpatient Internal Medicine provider can help set this up for you.  We are glad you are feeling better! Please call the Internal Medicine clinic triage number at [phone] if you start to experience high fevers again or increased testicular pain. If you cannot get in contact with the clinic, please return to the hospital to be evaluated.  Thank you for trusting [place] with your care! Governor Rooks, Medical Student Dr. Faith Rogue, PGY1 Dr. Monico Hoar, PGY2   Increase activity slowly   Complete by: As directed        Signed: Morene Crocker, MD [facility] - PGY2 Pager: [phone] 01/07/2023, 12:21 PM    Please contact the on call pager after 5 pm and on weekends at [phone].

## 2023-01-07 NOTE — TOC Transition Note (Signed)
Transition of Care [facility]) - CM/SW Discharge Note   Patient Details  Name: Leon Rodriguez MRN: [id number] Date of Birth: 09-25-1965  [facility] CM/SW Contact:  Tom-[name], Hershal Coria, RN Phone Number: 01/07/2023, 1:13 PM   Clinical Narrative:     Patient is scheduled for discharge today.  Admitted with Rt Epididymorchitis, transitioned from IV abx to oral abx and will discharge home on oral abx.     New Patient  Establishment with Internal Medicine schedule and discharge instructions on AVS.   Prescriptions sent to [facility] pharmacy and meds will be delivered to patient at bedside prior discharge. Patient does not have Medical Insurance, total cost of meds is $14.70 and patient agreed to pay out of pocket. No MATCH needed at this time.   Significant other, [name] to transport at discharge.  No further TOC needs noted.       Final next level of care: Home/Self Care Barriers to Discharge: Barriers Resolved   Patient Goals and CMS Choice CMS Medicare.gov Compare Post Acute Care list provided to:: Patient Choice offered to / list presented to : NA  Discharge Placement                  Patient to be transferred to facility by: Significant other Name of family member notified: [name]    Discharge Plan and Services Additional resources added to the After Visit Summary for                  DME Arranged: N/A DME Agency: NA       HH Arranged: NA HH Agency: NA        Social Determinants of Health (SDOH) Interventions     Readmission Risk Interventions     No data to display

## 2023-01-07 NOTE — Progress Notes (Signed)
DISCHARGE NOTE HOME DEMARCO BACCI to be discharged Home per MD order. Discussed prescriptions and follow up appointments with the patient. Prescriptions given to patient; medication list explained in detail. Patient verbalized understanding.  Skin clean, dry and intact without evidence of skin break down, no evidence of skin tears noted. IV catheter discontinued intact. Site without signs and symptoms of complications. Dressing and pressure applied. Pt denies pain at the site currently. No complaints noted.  Patient free of lines, drains, and wounds.   An After Visit Summary (AVS) was printed and given to the patient. Patient escorted to discharge lounge and Haze Justin will pick him up soon.  Irwin Brakeman, RN

## 2023-01-08 LAB — GC/CHLAMYDIA PROBE AMP (~~LOC~~) NOT AT ARMC
Chlamydia: NEGATIVE
Comment: NEGATIVE
Comment: NORMAL
Neisseria Gonorrhea: NEGATIVE

## 2023-01-11 LAB — CULTURE, BLOOD (ROUTINE X 2)
Culture: NO GROWTH
Culture: NO GROWTH
Special Requests: ADEQUATE
Special Requests: ADEQUATE

## 2023-01-16 ENCOUNTER — Other Ambulatory Visit: Payer: Self-pay

## 2023-01-16 ENCOUNTER — Ambulatory Visit (INDEPENDENT_AMBULATORY_CARE_PROVIDER_SITE_OTHER): Payer: Self-pay | Admitting: Student

## 2023-01-16 VITALS — BP 103/61 | HR 97 | Temp 98.1°F | Ht 69.0 in | Wt 220.7 lb

## 2023-01-16 DIAGNOSIS — M25551 Pain in right hip: Secondary | ICD-10-CM | POA: Insufficient documentation

## 2023-01-16 DIAGNOSIS — E669 Obesity, unspecified: Secondary | ICD-10-CM | POA: Insufficient documentation

## 2023-01-16 DIAGNOSIS — F1990 Other psychoactive substance use, unspecified, uncomplicated: Secondary | ICD-10-CM

## 2023-01-16 DIAGNOSIS — Z6832 Body mass index (BMI) 32.0-32.9, adult: Secondary | ICD-10-CM

## 2023-01-16 DIAGNOSIS — M25552 Pain in left hip: Secondary | ICD-10-CM

## 2023-01-16 DIAGNOSIS — N453 Epididymo-orchitis: Secondary | ICD-10-CM

## 2023-01-16 NOTE — Assessment & Plan Note (Signed)
Patient presented to clinic with concerns of bilateral hip pain, says he had a bilateral hip surgery when he was young due to his weight, but has not had any issues until a year ago.  He reports he follows up with an outpatient orthopedic that helps manage his pain, has been taking steroid shot but has not been effective.  He currently rates the pain 6 out of 10, says Tylenol has not been effective either.  Patient is recommended to follow-up with his orthopedics for pain control.

## 2023-01-16 NOTE — Patient Instructions (Signed)
Thank you, [name] for allowing [place] to provide your care today. Today we discussed your recent hospitalization,hip pain and your general health   - Continue to follow up with your ortho doctor - Continue eating healthy - I will see you again in 6-12 month for a wellness visit I have ordered the following labs for you:  Lab Orders  No laboratory test(s) ordered today     Tests ordered today:    Referrals ordered today:   Referral Orders  No referral(s) requested today     I have ordered the following medication/changed the following medications:   Stop the following medications: There are no discontinued medications.   Start the following medications: No orders of the defined types were placed in this encounter.    Follow up: 6 months   Remember:   Should you have any questions or concerns please call the internal medicine clinic at [phone].    Kathleen Lime, M.D [facility]

## 2023-01-16 NOTE — Progress Notes (Signed)
CC: Hospital follow-up and to establish care  HPI:  Leon Rodriguez is a 57 y.o. male living with a history stated below and presents today for a follow-up and to establish care.  Of notes , patient was recently admitted  infection of the right testicular , treated with IV cefepime and transitioning to oral levofloxacin floxacillin for 8 days.  Patient does not have any significant past medical history, forward to having bilateral hip surgery many years ago and have metals placed in his hip joint.  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  His drug of choice is marijuana that he smokes every day and drinks 2 bottles of beer every day.  He expresses concerns of establishing care and getting his health and check moving forward.   Please see problem based assessment and plan for additional details.  No past medical history on file.  Current Outpatient Medications on File Prior to Visit  Medication Sig Dispense Refill   acetaminophen (TYLENOL) 500 MG tablet Take 2 tablets (1,000 mg total) by mouth every 8 (eight) hours as needed for fever. 30 tablet 0   Ascorbic Acid (VITAMIN C PO) Take 1 tablet by mouth daily.     Aspirin-Acetaminophen-Caffeine (GOODYS EXTRA STRENGTH PO) Take 1 Package by mouth 2 (two) times daily as needed (Pain).     Boswellia-Glucosamine-Vit D (OSTEO BI-FLEX ONE PER DAY PO) Take 1 tablet by mouth daily.     levofloxacin (LEVAQUIN) 500 MG tablet Take 1 tablet (500 mg total) by mouth daily for 8 days. 8 tablet 0   Multiple Vitamin (MULTIVITAMIN PO) Take 1 tablet by mouth daily.     Multiple Vitamins-Minerals (ZINC PO) Take 1 tablet by mouth daily.     oxyCODONE (ROXICODONE) 5 MG immediate release tablet Take 1 tablet (5 mg total) by mouth every 8 (eight) hours as needed for severe pain. 4 tablet 0   Turmeric (QC TUMERIC COMPLEX PO) Take 1 tablet by mouth daily.      No current facility-administered medications on file prior to visit.    No family history on file.  Social History   Socioeconomic History   Marital status: Married    Spouse name: Not on file   Number of children: Not on file   Years of education: Not on file   Highest education level: Not on file  Occupational History   Not on file  Tobacco Use   Smoking status: Not on file   Smokeless tobacco: Not on file  Substance and Sexual Activity   Alcohol use: Not on file   Drug use: Not on file   Sexual activity: Not on file  Other Topics Concern   Not on file  Social History Narrative   Not on file   Social Determinants of Health   Financial Resource Strain: Not on file  Food Insecurity: Not on file  Transportation Needs: Not on file  Physical Activity: Not on file  Stress: Not on file  Social Connections: Not on file  Intimate Partner Violence: Not on file    Review of Systems: ROS negative except for what is noted on the assessment and plan.  Vitals:   01/16/23 1332  BP: 103/61  Pulse: 97  Temp: 98.1 F (36.7 C)  TempSrc: Oral  SpO2: 100%  Weight: 220 lb 11.2 oz (100.1 kg)  Height: 5\' 9"  (1.753 m)  Physical Exam: Constitutional: Well-appearing male sitting in a chair no acute distress  GU: Mildly swelling right testicles compared to the left, no erythema or warmth MSK: Normal skin pigmentation , walks with a walker and lean on the left foot  neurological: alert & oriented x 3, no focal deficit Skin: warm and dry Psych: normal mood and behavior  Assessment & Plan:   Epididymo-orchitis History of recent admission for which she was managed for infection of the right testicles treated with antibiotics and presented today for follow-up.  Denies any pain or discomfort in the groin area at this time, on exam right testicles is mildly tender, about the same size of the left testicle, no erythema ,discharge or scrotal changes.  Recommended the patient to  continue to observe and follow-up with the clinic if symptoms returns or get worse   Bilateral hip pain Patient presented to clinic with concerns of bilateral hip pain, says he had a bilateral hip surgery when he was young due to his weight, but has not had any issues until a year ago.  He reports he follows up with an outpatient orthopedic that helps manage his pain, has been taking steroid shot but has not been effective.  He currently rates the pain 6 out of 10, says Tylenol has not been effective either.  Patient is recommended to follow-up with his orthopedics for pain control.  Obesity (BMI 30-39.9) Patient's BMI is 32.59, says he is lost over 100 pounds in the past couple of months through diet and lifestyle modification.  Medication therapy that helps and weight loss has been adequately discussed with the patient, however patient is only agreeable to continuing with his diet modification at this time.  Polysubstance use disorder Patient reports smoking 2 packs of cigarettes a day for the past 18 years, but switched to cigars every day.  Says he drinks 2-3 beer every day and smokes marijuana to relax daily.  Patient is adequately counseled on substance use and its long time effect.  Nicotine and illicit drug use cessation has also been discussed as well.  Patient states he will let us know whenever he needs help in regards.  Patient seen with Dr. Bayard Beaver, M.D [facility] Phone: [phone] Date 01/16/2023 Time 5:28 PM

## 2023-01-16 NOTE — Assessment & Plan Note (Addendum)
History of recent admission for which she was managed for infection of the right testicles treated with antibiotics and presented today for follow-up.  Denies any pain or discomfort in the groin area at this time, on exam right testicles is mildly tender, about the same size of the left testicle, no erythema ,discharge or scrotal changes.  Recommended the patient to continue to observe and follow-up with the clinic if symptoms returns or get worse

## 2023-01-16 NOTE — Assessment & Plan Note (Signed)
Patient's BMI is 32.59, says he is lost over 100 pounds in the past couple of months through diet and lifestyle modification.  Medication therapy that helps and weight loss has been adequately discussed with the patient, however patient is only agreeable to continuing with his diet modification at this time.

## 2023-01-16 NOTE — Assessment & Plan Note (Signed)
Patient reports smoking 2 packs of cigarettes a day for the past 18 years, but switched to cigars every day.  Says he drinks 2-3 beer every day and smokes marijuana to relax daily.  Patient is adequately counseled on substance use and its long time effect.  Nicotine and illicit drug use cessation has also been discussed as well.  Patient states he will let us know whenever he needs help in regards.

## 2023-01-23 NOTE — Progress Notes (Signed)
Internal Medicine Clinic Attending  I was physically present during the key portions of the resident provided service and participated in the medical decision making of patient's management care. I reviewed pertinent patient test results.  The assessment, diagnosis, and plan were formulated together and I agree with the documentation in the resident's note.  Odf note, Lungs were xclear to auscultation bilaterally and Cardiovascular exam revealed RRR with normal heart sounds. On GU exam, his right testicle was mildly tender and enlarger but much improved per patient.  Earl Lagos, MD

## 2023-03-06 ENCOUNTER — Other Ambulatory Visit: Payer: Self-pay

## 2023-03-06 ENCOUNTER — Ambulatory Visit: Payer: Self-pay | Admitting: Student

## 2023-03-06 ENCOUNTER — Other Ambulatory Visit (HOSPITAL_COMMUNITY): Payer: Self-pay

## 2023-03-06 ENCOUNTER — Encounter: Payer: Self-pay | Admitting: Student

## 2023-03-06 VITALS — BP 113/76 | HR 93 | Temp 98.4°F | Ht 69.0 in | Wt 216.3 lb

## 2023-03-06 DIAGNOSIS — Z1211 Encounter for screening for malignant neoplasm of colon: Secondary | ICD-10-CM | POA: Insufficient documentation

## 2023-03-06 DIAGNOSIS — M25551 Pain in right hip: Secondary | ICD-10-CM

## 2023-03-06 DIAGNOSIS — M25552 Pain in left hip: Secondary | ICD-10-CM

## 2023-03-06 MED ORDER — METHOCARBAMOL 500 MG PO TABS
500.0000 mg | ORAL_TABLET | Freq: Every day | ORAL | 0 refills | Status: DC
  Filled 2023-03-06: qty 30, 30d supply, fill #0

## 2023-03-06 MED ORDER — DULOXETINE HCL 30 MG PO CPEP
30.0000 mg | ORAL_CAPSULE | Freq: Every day | ORAL | 1 refills | Status: DC
  Filled 2023-03-06: qty 30, 30d supply, fill #0

## 2023-03-06 MED ORDER — METHOCARBAMOL 500 MG PO TABS
500.0000 mg | ORAL_TABLET | Freq: Every day | ORAL | 0 refills | Status: DC
Start: 2023-03-06 — End: 2023-04-05
  Filled 2023-03-06: qty 30, 30d supply, fill #0

## 2023-03-06 NOTE — Progress Notes (Signed)
CC: Bilateral hip pain follow-up  HPI:  [name] is a 57 y.o. male with a past medical history of polysubstance use disorder presenting for hip pain follow-up.  Please see assessment and plan for full HPI  Medications: Hip pain: Oxycodone 5 mg every 8 hours as needed, vitamin D supplementation Headaches: Goody powders   Current Outpatient Medications:    acetaminophen (TYLENOL) 500 MG tablet, Take 2 tablets (1,000 mg total) by mouth every 8 (eight) hours as needed for fever., Disp: 30 tablet, Rfl: 0   Ascorbic Acid (VITAMIN C PO), Take 1 tablet by mouth daily., Disp: , Rfl:    Aspirin-Acetaminophen-Caffeine (GOODYS EXTRA STRENGTH PO), Take 1 Package by mouth 2 (two) times daily as needed (Pain)., Disp: , Rfl:    Boswellia-Glucosamine-Vit D (OSTEO BI-FLEX ONE PER DAY PO), Take 1 tablet by mouth daily., Disp: , Rfl:    DULoxetine (CYMBALTA) 30 MG capsule, Take 1 capsule (30 mg total) by mouth daily., Disp: 30 capsule, Rfl: 1   methocarbamol (ROBAXIN) 500 MG tablet, Take 1 tablet (500 mg total) by mouth at bedtime., Disp: 30 tablet, Rfl: 0   Multiple Vitamin (MULTIVITAMIN PO), Take 1 tablet by mouth daily., Disp: , Rfl:    Multiple Vitamins-Minerals (ZINC PO), Take 1 tablet by mouth daily., Disp: , Rfl:    Turmeric (QC TUMERIC COMPLEX PO), Take 1 tablet by mouth daily., Disp: , Rfl:   Review of Systems:   MSK: Patient endorses bilateral hip pain  Physical Exam:  Vitals:   03/06/23 0836  BP: 113/76  Pulse: 93  Temp: 98.4 F (36.9 C)  TempSrc: Oral  SpO2: 99%  Weight: 216 lb 4.8 oz (98.1 kg)  Height: 5\' 9"  (1.753 m)    General: Patient is sitting comfortably in the room  Head: Normocephalic, atraumatic  Cardio: Regular rate and rhythm, no murmurs, rubs or gallops Pulmonary: Clear to ausculation bilaterally with no rales, rhonchi, and crackles  MSK: Decreased range of motion actively and passively of right lower extremity on hip flexion, external rotation,  internal rotation   Assessment & Plan:   Bilateral hip pain Patient has a past medical history of bilateral hip pain.  He states he has been dealing with for a long time.  He states it has been worsening over time.  He states that he was previous seen for the same pain, and referred for orthopedic evaluation.  He states he is unable to go to his orthopedic at this time.  He states he previously had injections back in October 2023, December 2023, and March 2024.  He states injections have not helped.  They have requested him to get a hip evaluation for hip replacement.  Given lack of insurance, it has been difficult for patient to get hip replacement.  Today he states the pain is a 10 out of 10 and it is affecting his activities of daily living.  He denies any weight loss, falls, or fevers.  Does report he has trouble with standing, dressing, bathing, and cooking.  He is becoming frustrated with the pain.  He states he has tried Tylenol with no improvement.  Did offer physical therapy, and he states the pain is so unbearable that he could not participate in physical therapy.  Given his concern for chronic osteoarthritis and worsening pain I think we can trial Cymbalta for him.  He also reports having muscle spasms so we will give a short course of Robaxin.  Ultimately, patient will need a joint  replacement.  Will try to medically manage the best we can.  Plan: -Encourage patient to go back to his orthopedic doctor -Start Cymbalta 30 mg daily -Start Robaxin 500 mg nightly -Talked about goals with patient, and he states his goal is to be able to get out of the car without a problem and go to work -Will try to work towards patient goals. I did have conversation with patient about pain control and that it would likely never go away, but will try to make it tolerable and patient was understanding about this -Encourage patient to get health insurance which he states he is trying to get Medicaid -Once patient  gets insurance can talk about preop clearance, but did calculate RCRI which was 0.  Also did NSQIP score which calculated as lower than average risk.  Colon cancer screening Patient referred for colonoscopy.  Patient seen with Dr. Letta Kocher, DO PGY-2 Internal Medicine Resident  Pager: [phone]

## 2023-03-06 NOTE — Assessment & Plan Note (Addendum)
Patient has a past medical history of bilateral hip pain.  He states he has been dealing with for a long time.  He states it has been worsening over time.  He states that he was previous seen for the same pain, and referred for orthopedic evaluation.  He states he is unable to go to his orthopedic at this time.  He states he previously had injections back in October 2023, December 2023, and March 2024.  He states injections have not helped.  They have requested him to get a hip evaluation for hip replacement.  Given lack of insurance, it has been difficult for patient to get hip replacement.  Today he states the pain is a 10 out of 10 and it is affecting his activities of daily living.  He denies any weight loss, falls, or fevers.  Does report he has trouble with standing, dressing, bathing, and cooking.  He is becoming frustrated with the pain.  He states he has tried Tylenol with no improvement.  Did offer physical therapy, and he states the pain is so unbearable that he could not participate in physical therapy.  Given his concern for chronic osteoarthritis and worsening pain I think we can trial Cymbalta for him.  He also reports having muscle spasms so we will give a short course of Robaxin.  Ultimately, patient will need a joint replacement.  Will try to medically manage the best we can.  Plan: -Encourage patient to go back to his orthopedic doctor -Start Cymbalta 30 mg daily -Start Robaxin 500 mg nightly -Talked about goals with patient, and he states his goal is to be able to get out of the car without a problem and go to work -Will try to work towards patient goals. I did have conversation with patient about pain control and that it would likely never go away, but will try to make it tolerable and patient was understanding about this -Encourage patient to get health insurance which he states he is trying to get Medicaid -Once patient gets insurance can talk about preop clearance, but did calculate  RCRI which was 0.  Also did NSQIP score which calculated as lower than average risk.

## 2023-03-06 NOTE — Assessment & Plan Note (Signed)
Patient referred for colonoscopy

## 2023-03-06 NOTE — Patient Instructions (Addendum)
Leon Rodriguez you for allowing me to take part in your care today.  Here are your instructions.  1.  Regarding your hip pain, I will send in Cymbalta.  Please take this daily.  Also will send in Robaxin, this is a muscle relaxer to help with the spasms.  Please take this at night.  2.  Please come back in 1 month and we can reevaluate your hip pain.  We can discuss how the Cymbalta is helping or not.  Thank you, Dr. Allena Katz  If you have any other questions please contact the internal medicine clinic at [phone] If it is after hours, please call the [facility] at [phone] and then ask the person who picks up for the resident on call.

## 2023-03-07 NOTE — Progress Notes (Signed)
Internal Medicine Clinic Attending  I was physically present during the key portions of the resident provided service and participated in the medical decision making of patient's management care. I reviewed pertinent patient test results.  The assessment, diagnosis, and plan were formulated together and I agree with the documentation in the resident's note.  Erlinda Hong, MD FACP

## 2023-04-05 ENCOUNTER — Other Ambulatory Visit: Payer: Self-pay

## 2023-04-05 ENCOUNTER — Ambulatory Visit (INDEPENDENT_AMBULATORY_CARE_PROVIDER_SITE_OTHER): Payer: Medicaid Other | Admitting: Internal Medicine

## 2023-04-05 ENCOUNTER — Other Ambulatory Visit (HOSPITAL_COMMUNITY): Payer: Self-pay

## 2023-04-05 VITALS — BP 111/68 | HR 92 | Temp 98.3°F | Ht 69.0 in | Wt 212.2 lb

## 2023-04-05 DIAGNOSIS — M25551 Pain in right hip: Secondary | ICD-10-CM | POA: Diagnosis present

## 2023-04-05 DIAGNOSIS — M25552 Pain in left hip: Secondary | ICD-10-CM | POA: Diagnosis not present

## 2023-04-05 MED ORDER — METHOCARBAMOL 750 MG PO TABS: 750.0000 mg | ORAL_TABLET | Freq: Three times a day (TID) | ORAL | 0 refills | Status: AC | PRN

## 2023-04-05 MED ORDER — DULOXETINE HCL 60 MG PO CPEP: 60.0000 mg | ORAL_CAPSULE | Freq: Every day | ORAL | 0 refills | Status: DC

## 2023-04-05 NOTE — Assessment & Plan Note (Addendum)
The patient has a longstanding history of bilateral hip pain and severe arthritis. He has been referred to orthopedics in the past and has been recommended to have a bilateral hip replacement, however, he does not have insurance at this time. He has also previously had injections that did not help his pain. At his last office visit one month ago he was started on Cymbalta 30 mg daily and Robaxin 500 mg at bedtime. He states that these medications did not help him and he continues to have difficulties with his ADLs including standing, dressing, bathing, and cooking. The only ADL he can successfully complete on his own is feeding himself, and he is dependent on his significant other, Delores, to help him with the rest. Given the patient's severe pain and osteoarthritis, he has become disabled. 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   Plan: - Apply for medicaid  - Bilateral hip replacement once pt has insurance - Increase Cymbalta to 60 mg daily - Increase Robaxin to 750 mg q8h PRN - Follow up in 1 month to assess response

## 2023-04-05 NOTE — Progress Notes (Signed)
CC: 1 month follow up   HPI:  [name] is a 57 y.o. male living with a history stated below and presents today for a 1 month follow up of bilateral hip pain. Please see problem based assessment and plan for additional details.  No past medical history on file.  Current Outpatient Medications on File Prior to Visit  Medication Sig Dispense Refill   acetaminophen (TYLENOL) 500 MG tablet Take 2 tablets (1,000 mg total) by mouth every 8 (eight) hours as needed for fever. 30 tablet 0   Ascorbic Acid (VITAMIN C PO) Take 1 tablet by mouth daily.     Aspirin-Acetaminophen-Caffeine (GOODYS EXTRA STRENGTH PO) Take 1 Package by mouth 2 (two) times daily as needed (Pain).     Boswellia-Glucosamine-Vit D (OSTEO BI-FLEX ONE PER DAY PO) Take 1 tablet by mouth daily.     Multiple Vitamin (MULTIVITAMIN PO) Take 1 tablet by mouth daily.     Multiple Vitamins-Minerals (ZINC PO) Take 1 tablet by mouth daily.     Turmeric (QC TUMERIC COMPLEX PO) Take 1 tablet by mouth daily.     No current facility-administered medications on file prior to visit.    No family history on file.  Social History   Socioeconomic History   Marital status: Married    Spouse name: Not on file   Number of children: Not on file   Years of education: Not on file   Highest education level: Not on file  Occupational History   Not on file  Tobacco Use   Smoking status: Not on file   Smokeless tobacco: Not on file  Substance and Sexual Activity   Alcohol use: Not on file   Drug use: Not on file   Sexual activity: Not on file  Other Topics Concern   Not on file  Social History Narrative   Not on file   Social Determinants of Health   Financial Resource Strain: Not on file  Food Insecurity: Not on file  Transportation Needs: Not on file  Physical Activity: Not on file  Stress: Not on file  Social Connections: Not on file  Intimate Partner Violence: Not on file    Review of Systems: ROS negative  except for what is noted on the assessment and plan.  Vitals:   04/05/23 0918  BP: 111/68  Pulse: 92  Temp: 98.3 F (36.8 C)  TempSrc: Oral  SpO2: 99%  Weight: 212 lb 3.2 oz (96.3 kg)  Height: 5\' 9"  (1.753 m)    Physical Exam: Constitutional: middle aged male sitting in wheelchair, in no acute distress Cardiovascular: regular rate and rhythm Pulmonary/Chest: normal work of breathing on room air MSK: normal bulk and tone, decreased active and passive ROM of right lower extremity  Neurological: alert & oriented x 3, no focal deficit Skin: warm and dry Psych: normal mood and behavior  Assessment & Plan:   Patient discussed with Dr. Criselda Peaches  Bilateral hip pain The patient has a longstanding history of bilateral hip pain and severe arthritis. He has been referred to orthopedics in the past and has been recommended to have a bilateral hip replacement, however, he does not have insurance at this time. He has also previously had injections that did not help his pain. At his last office visit one month ago he was started on Cymbalta 30 mg daily and Robaxin 500 mg at bedtime. He states that these medications did not help him and he continues to have difficulties with his ADLs  including standing, dressing, bathing, and cooking. The only ADL he can successfully complete on his own is feeding himself, and he is dependent on his significant other, Leon Rodriguez, to help him with the rest. Given the patient's severe pain and osteoarthritis, he has become disabled. 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   Plan: - Apply for medicaid  - Bilateral hip replacement once pt has insurance - Increase Cymbalta to 60 mg daily - Increase Robaxin to 750 mg q8h PRN - Follow up in 1 month to assess response   Seville Brick, D.O. [facility] Internal Medicine, PGY-3 Phone: [phone] Date 04/05/2023  Time 2:57 PM

## 2023-04-05 NOTE — Patient Instructions (Addendum)
Thank you, [name] for allowing [place] to provide your care today. Today we discussed:  Hip pain I have increased your cymbalta to 60 mg daily I have also increased your muscle relaxer, robaxin, to 750 mg (you can take this up to every 8 hours as needed) I will fill out your handicap placard and keep working on getting medicaid  I have ordered the following labs for you:  Lab Orders  No laboratory test(s) ordered today     Referrals ordered today:   Referral Orders  No referral(s) requested today     I have ordered the following medication/changed the following medications:   Stop the following medications: Medications Discontinued During This Encounter  Medication Reason   DULoxetine (CYMBALTA) 30 MG capsule Reorder   methocarbamol (ROBAXIN) 500 MG tablet Reorder     Start the following medications: Meds ordered this encounter  Medications   DULoxetine (CYMBALTA) 60 MG capsule    Sig: Take 1 capsule (60 mg total) by mouth daily.    Dispense:  60 capsule    Refill:  0   methocarbamol (ROBAXIN) 750 MG tablet    Sig: Take 1 tablet (750 mg total) by mouth every 8 (eight) hours as needed for muscle spasms.    Dispense:  90 tablet    Refill:  0     Follow up:  1 month      Should you have any questions or concerns please call the internal medicine clinic at [phone].     Elza Rafter, D.O. [facility]

## 2023-04-23 NOTE — Progress Notes (Signed)
Internal Medicine Clinic Attending  Case discussed with the resident at the time of the visit.  We reviewed the resident's history and exam and pertinent patient test results.  I agree with the assessment, diagnosis, and plan of care documented in the resident's note.  Debe Coder, MD

## 2023-04-28 ENCOUNTER — Encounter (HOSPITAL_BASED_OUTPATIENT_CLINIC_OR_DEPARTMENT_OTHER): Payer: Self-pay | Admitting: *Deleted

## 2023-04-28 ENCOUNTER — Emergency Department (HOSPITAL_COMMUNITY): Payer: Medicaid Other

## 2023-04-28 ENCOUNTER — Emergency Department (HOSPITAL_BASED_OUTPATIENT_CLINIC_OR_DEPARTMENT_OTHER)
Admission: EM | Admit: 2023-04-28 | Discharge: 2023-04-28 | Disposition: A | Discharge status: Home / Self Care | Payer: Medicaid Other

## 2023-04-28 ENCOUNTER — Other Ambulatory Visit: Payer: Self-pay

## 2023-04-28 DIAGNOSIS — M48061 Spinal stenosis, lumbar region without neurogenic claudication: Secondary | ICD-10-CM | POA: Insufficient documentation

## 2023-04-28 DIAGNOSIS — R32 Unspecified urinary incontinence: Secondary | ICD-10-CM | POA: Diagnosis not present

## 2023-04-28 DIAGNOSIS — M549 Dorsalgia, unspecified: Secondary | ICD-10-CM | POA: Diagnosis present

## 2023-04-28 HISTORY — DX: Unspecified osteoarthritis, unspecified site: M19.90

## 2023-04-28 HISTORY — DX: Pain in unspecified hip: M25.559

## 2023-04-28 LAB — CBC WITH DIFFERENTIAL/PLATELET
Abs Immature Granulocytes: 0.01 K/uL (ref 0.00–0.07)
Basophils Absolute: 0 K/uL (ref 0.0–0.1)
Basophils Relative: 1 %
Eosinophils Absolute: 0.1 K/uL (ref 0.0–0.5)
Eosinophils Relative: 2 %
HCT: 39.3 % (ref 39.0–52.0)
Hemoglobin: 14 g/dL (ref 13.0–17.0)
Immature Granulocytes: 0 %
Lymphocytes Relative: 25 %
Lymphs Abs: 1.7 K/uL (ref 0.7–4.0)
MCH: 33.5 pg (ref 26.0–34.0)
MCHC: 35.6 g/dL (ref 30.0–36.0)
MCV: 94 fL (ref 80.0–100.0)
Monocytes Absolute: 0.5 K/uL (ref 0.1–1.0)
Monocytes Relative: 8 %
Neutro Abs: 4.4 K/uL (ref 1.7–7.7)
Neutrophils Relative %: 64 %
Platelets: 293 K/uL (ref 150–400)
RBC: 4.18 MIL/uL — ABNORMAL LOW (ref 4.22–5.81)
RDW: 12.6 % (ref 11.5–15.5)
WBC: 6.8 K/uL (ref 4.0–10.5)
nRBC: 0 % (ref 0.0–0.2)

## 2023-04-28 LAB — BASIC METABOLIC PANEL WITH GFR
Anion gap: 9 (ref 5–15)
BUN: 9 mg/dL (ref 6–20)
CO2: 25 mmol/L (ref 22–32)
Calcium: 9.7 mg/dL (ref 8.9–10.3)
Chloride: 104 mmol/L (ref 98–111)
Creatinine, Ser: 0.7 mg/dL (ref 0.61–1.24)
GFR, Estimated: >60 mL/min
Glucose, Bld: 132 mg/dL — ABNORMAL HIGH (ref 70–99)
Potassium: 3.9 mmol/L (ref 3.5–5.1)
Sodium: 138 mmol/L (ref 135–145)

## 2023-04-28 MED ORDER — OXYCODONE-ACETAMINOPHEN 5-325 MG PO TABS
1.0000 | ORAL_TABLET | Freq: Once | ORAL | Status: AC
  Administered 2023-04-28: 1 via ORAL
  Filled 2023-04-28: qty 1

## 2023-04-28 MED ORDER — METHYLPREDNISOLONE 4 MG PO TBPK: ORAL_TABLET | ORAL | 0 refills | Status: DC

## 2023-04-28 MED ORDER — MORPHINE SULFATE (PF) 4 MG/ML IV SOLN
4.0000 mg | Freq: Once | INTRAVENOUS | Status: AC
  Administered 2023-04-28: 4 mg via INTRAVENOUS
  Filled 2023-04-28: qty 1

## 2023-04-28 MED ORDER — LORAZEPAM 2 MG/ML IJ SOLN
1.0000 mg | Freq: Once | INTRAMUSCULAR | Status: AC
  Administered 2023-04-28: 1 mg via INTRAVENOUS
  Filled 2023-04-28: qty 1

## 2023-04-28 MED ORDER — OXYCODONE-ACETAMINOPHEN 5-325 MG PO TABS: 1.0000 | ORAL_TABLET | Freq: Four times a day (QID) | ORAL | 0 refills | Status: DC | PRN

## 2023-04-28 NOTE — Discharge Instructions (Addendum)
You have some spinal stenosis that is causing your symptoms.  Please take Medrol Dosepak as prescribed  I have also prescribed Percocet for pain.  Please continue taking your Cymbalta and Robaxin  Please call neurosurgery office on Monday for follow-up  Return to ER if you have worse back pain or trouble urinating or trouble walking

## 2023-04-28 NOTE — ED Triage Notes (Addendum)
Pt is here for evaluation of left hip pain which began October 2023.  Pt has been seen by an orthopedist and received a cortisone shots in bilateral hips.  Pt was told that he has arthritis.  Pt states that this did not help at all and he has numbness in his skin on lower back and down both legs for the past month.  Pt was last seen by orthopedics in may 2024.  Pt reports severe muscle spasms

## 2023-04-28 NOTE — ED Notes (Signed)
Report to [facility] charge at this time

## 2023-04-28 NOTE — ED Provider Notes (Signed)
  Physical Exam  BP 120/85 (BP Location: Right Arm)   Pulse 92   Temp 98.6 F (37 C)   Resp 16   SpO2 99%   Physical Exam  Procedures  Procedures  ED Course / MDM    Medical Decision Making Patient was transferred here from [facility].  Patient has been having progressively worsening back pain and had some incontinence.  MRI is pending  8:05 PM MRI showed multilevel spondylosis with multiple canal stenosis.  Patient does not have cauda equina or spinal cord compression.  Will discharge home with course of steroids and pain medicine.  Patient is already on methocarbamol and Cymbalta.  He does not have a pain contract.  Will refer to neurosurgery outpatient   Problems Addressed: Spinal stenosis of lumbar region without neurogenic claudication: chronic illness or injury with exacerbation, progression, or side effects of treatment Urinary incontinence, unspecified type: acute illness or injury  Amount and/or Complexity of Data Reviewed Labs: ordered. Decision-making details documented in ED Course. Radiology: ordered and independent interpretation performed. Decision-making details documented in ED Course.  Risk Prescription drug management.          Charlynne Pander, MD 04/28/23 2007

## 2023-04-28 NOTE — ED Provider Notes (Signed)
[place] EMERGENCY DEPARTMENT AT [facility] Provider Note   CSN: [id number] Arrival date & time: 04/28/23  1348     History  No chief complaint on file.   Leon Rodriguez is a 57 y.o. male.  57 year old male with past medical history of chronic bilateral hip pain presenting to the emergency department today with pain in his back.  The patient states that he initially thought that it was his hip pain that was radiating to his back.  He has been seeing orthopedics for this.  He states this been going now for months.  He states that he is now starting to have pain from his back that radiates down his bilateral lower legs.  He states this been going now for the past 4 weeks.  Is making it difficult to walk.  The patient has been using a wheelchair intermittently to get around with this.  He states that over the past month that he has been having some urinary incontinence.  He denies any fecal incontinence or saddle anesthesia.  He states that his legs do feel numb and this makes it difficult to walk.  He denies any fevers.  He came to the emergency department today for further evaluation regarding this due to ongoing symptoms.  He denies any history of fevers or IV drug abuse.        Home Medications Prior to Admission medications   Medication Sig Start Date End Date Taking? Authorizing Provider  acetaminophen (TYLENOL) 500 MG tablet Take 2 tablets (1,000 mg total) by mouth every 8 (eight) hours as needed for fever. 01/07/23   Faith Rogue, DO  Ascorbic Acid (VITAMIN C PO) Take 1 tablet by mouth daily.    [provider]  Aspirin-Acetaminophen-Caffeine (GOODYS EXTRA STRENGTH PO) Take 1 Package by mouth 2 (two) times daily as needed (Pain).    [provider]  Boswellia-Glucosamine-Vit D (OSTEO BI-FLEX ONE PER DAY PO) Take 1 tablet by mouth daily.    [provider]  DULoxetine (CYMBALTA) 60 MG capsule Take 1 capsule (60 mg total) by mouth daily.  04/05/23   Atway, Rayann N, DO  methocarbamol (ROBAXIN) 750 MG tablet Take 1 tablet (750 mg total) by mouth every 8 (eight) hours as needed for muscle spasms. 04/05/23 05/05/23  Atway, Derwood Kaplan, DO  Multiple Vitamin (MULTIVITAMIN PO) Take 1 tablet by mouth daily.    [provider]  Multiple Vitamins-Minerals (ZINC PO) Take 1 tablet by mouth daily.    [provider]  Turmeric (QC TUMERIC COMPLEX PO) Take 1 tablet by mouth daily.    [provider]      Allergies    Patient has no known allergies.    Review of Systems   Review of Systems  Musculoskeletal:  Positive for arthralgias and back pain.  Neurological:  Positive for numbness.  All other systems reviewed and are negative.   Physical Exam Updated Vital Signs BP (!) 105/57 (BP Location: Left Arm)   Pulse 95   Temp 97.9 F (36.6 C) (Oral)   Resp 18   SpO2 100%  Physical Exam Vitals and nursing note reviewed.   Gen: NAD Eyes: PERRL, EOMI HEENT: no oropharyngeal swelling Neck: trachea midline Resp: clear to auscultation bilaterally Card: RRR, no murmurs, rubs, or gallops Abd: nontender, nondistended Extremities: no calf tenderness, no edema MSK: The patient is tender over the mid lumbar spine with no step-offs or deformities, positive straight leg raise bilaterally Neuro: The patient reports equal sensation  through the bilateral lower extremities, I cannot elicit patellar reflexes bilaterally, the patient does have 4 out of 5 strength throughout the bilateral lower extremities Vascular: 2+ radial pulses bilaterally, 2+ DP pulses bilaterally Skin: no rashes Psyc: acting appropriately   ED Results / Procedures / Treatments   Labs (all labs ordered are listed, but only abnormal results are displayed) Labs Reviewed - No data to display  EKG None  Radiology No results found.  Procedures Procedures    Medications Ordered in ED Medications  oxyCODONE-acetaminophen (PERCOCET/ROXICET)  5-325 MG per tablet 1 tablet (1 tablet Oral Given 04/28/23 1535)    ED Course/ Medical Decision Making/ A&P                                 Medical Decision Making 57 year old male with past medical history of chronic bilateral hip pain presenting to the emergency department today with history and exam concerning more for radiculopathy with the red flag symptoms for cauda equina syndrome being his urinary incontinence.  I will give patient Percocet for pain.  We do not have MRI capabilities here currently.  I did speak with my colleagues over at [facility] and we will transfer the patient by private vehicle there for MRI of his lumbar spine.  Dr. Andria Meuse accepts patient for ER to ER transfer.  Amount and/or Complexity of Data Reviewed Radiology: ordered.  Risk Prescription drug management.          Final Clinical Impression(s) / ED Diagnoses Final diagnoses:  Back pain, unspecified back location, unspecified back pain laterality, unspecified chronicity  Urinary incontinence, unspecified type    Rx / DC Orders ED Discharge Orders     None         Durwin Glaze, MD 04/28/23 1541

## 2023-04-28 NOTE — ED Notes (Signed)
Pt arrived here at [facility]. Vitals taken

## 2023-05-03 ENCOUNTER — Encounter: Payer: Medicaid Other | Admitting: Student

## 2023-05-07 ENCOUNTER — Other Ambulatory Visit (HOSPITAL_COMMUNITY): Payer: Self-pay | Admitting: Neurological Surgery

## 2023-05-07 DIAGNOSIS — G959 Disease of spinal cord, unspecified: Secondary | ICD-10-CM

## 2023-05-10 ENCOUNTER — Ambulatory Visit (HOSPITAL_COMMUNITY)
Admission: RE | Admit: 2023-05-10 | Discharge: 2023-05-10 | Disposition: A | Discharge status: Home / Self Care | Payer: Medicaid Other | Source: Ambulatory Visit | Attending: Neurological Surgery | Admitting: Neurological Surgery

## 2023-05-10 DIAGNOSIS — G959 Disease of spinal cord, unspecified: Secondary | ICD-10-CM | POA: Diagnosis present

## 2023-05-27 ENCOUNTER — Other Ambulatory Visit: Payer: Self-pay | Admitting: Student

## 2023-05-31 ENCOUNTER — Other Ambulatory Visit: Payer: Self-pay | Admitting: Neurological Surgery

## 2023-06-03 ENCOUNTER — Other Ambulatory Visit: Payer: Self-pay | Admitting: Neurological Surgery

## 2023-06-03 NOTE — Progress Notes (Signed)
Surgical Instructions   Your procedure is scheduled on Wednesday, June 05, 2023. Report to [facility] Main Entrance "A" at 8:35 A.M., then check in with the Admitting office. Any questions or running late day of surgery: call [phone]  Questions prior to your surgery date: call [phone], Monday-Friday, 8am-4pm. If you experience any cold or flu symptoms such as cough, fever, chills, shortness of breath, etc. between now and your scheduled surgery, please notify us at the above number.     Remember:  Do not eat or drink  after midnight the night before your surgery  Take these medicines if needed  the morning of surgery with A SIP OF WATER  acetaminophen (TYLENOL)  Baclofen  famotidine (ZANTAC 360)    One week prior to surgery, STOP taking any Aspirin (unless otherwise instructed by your surgeon) Aleve, Naproxen, Ibuprofen, Motrin, Advil, Goody's, BC's, all herbal medications, fish oil, and non-prescription vitamins.                     Do NOT Smoke (Tobacco/Vaping) for 24 hours prior to your procedure.  If you use a CPAP at night, you may bring your mask/headgear for your overnight stay.   You will be asked to remove any contacts, glasses, piercing's, hearing aid's, dentures/partials prior to surgery. Please bring cases for these items if needed.    Patients discharged the day of surgery will not be allowed to drive home, and someone needs to stay with them for 24 hours.  SURGICAL WAITING ROOM VISITATION Patients may have no more than 2 support people in the waiting area - these visitors may rotate.   Pre-op nurse will coordinate an appropriate time for 1 ADULT support person, who may not rotate, to accompany patient in pre-op.  Children under the age of 78 must have an adult with them who is not the patient and must remain in the main waiting area with an adult.  If the patient needs to stay at the hospital during part of their recovery, the visitor guidelines for  inpatient rooms apply.  Please refer to the [facility] website for the visitor guidelines for any additional information.   If you received a COVID test during your pre-op visit  it is requested that you wear a mask when out in public, stay away from anyone that may not be feeling well and notify your surgeon if you develop symptoms. If you have been in contact with anyone that has tested positive in the last 10 days please notify you surgeon.      Pre-operative 5 CHG Bathing Instructions   You can play a key role in reducing the risk of infection after surgery. Your skin needs to be as free of germs as possible. You can reduce the number of germs on your skin by washing with CHG (chlorhexidine gluconate) soap before surgery. CHG is an antiseptic soap that kills germs and continues to kill germs even after washing.   DO NOT use if you have an allergy to chlorhexidine/CHG or antibacterial soaps. If your skin becomes reddened or irritated, stop using the CHG and notify one of our RNs at [phone].   Please shower with the CHG soap starting 4 days before surgery using the following schedule:     Please keep in mind the following:  DO NOT shave, including legs and underarms, starting the day of your first shower.   You may shave your face at any point before/day of surgery.  Place clean  sheets on your bed the day you start using CHG soap. Use a clean washcloth (not used since being washed) for each shower. DO NOT sleep with pets once you start using the CHG.   CHG Shower Instructions:  Wash your face and private area with normal soap. If you choose to wash your hair, wash first with your normal shampoo.  After you use shampoo/soap, rinse your hair and body thoroughly to remove shampoo/soap residue.  Turn the water OFF and apply about 3 tablespoons (45 ml) of CHG soap to a CLEAN washcloth.  Apply CHG soap ONLY FROM YOUR NECK DOWN TO YOUR TOES (washing for 3-5 minutes)  DO NOT use CHG  soap on face, private areas, open wounds, or sores.  Pay special attention to the area where your surgery is being performed.  If you are having back surgery, having someone wash your back for you may be helpful. Wait 2 minutes after CHG soap is applied, then you may rinse off the CHG soap.  Pat dry with a clean towel  Put on clean clothes/pajamas   If you choose to wear lotion, please use ONLY the CHG-compatible lotions on the back of this paper.   Additional instructions for the day of surgery: DO NOT APPLY any lotions, deodorants, cologne. Do not bring valuables to the hospital. [facility] is not responsible for any belongings/valuables. Do not wear jewelry Put on clean/comfortable clothes.  Please brush your teeth.  Ask your nurse before applying any prescription medications to the skin.     CHG Compatible Lotions   Aveeno Moisturizing lotion  Cetaphil Moisturizing Cream  Cetaphil Moisturizing Lotion  Clairol Herbal Essence Moisturizing Lotion, Dry Skin  Clairol Herbal Essence Moisturizing Lotion, Extra Dry Skin  Clairol Herbal Essence Moisturizing Lotion, Normal Skin  Curel Age Defying Therapeutic Moisturizing Lotion with Alpha Hydroxy  Curel Extreme Care Body Lotion  Curel Soothing Hands Moisturizing Hand Lotion  Curel Therapeutic Moisturizing Cream, Fragrance-Free  Curel Therapeutic Moisturizing Lotion, Fragrance-Free  Curel Therapeutic Moisturizing Lotion, Original Formula  Eucerin Daily Replenishing Lotion  Eucerin Dry Skin Therapy Plus Alpha Hydroxy Crme  Eucerin Dry Skin Therapy Plus Alpha Hydroxy Lotion  Eucerin Original Crme  Eucerin Original Lotion  Eucerin Plus Crme Eucerin Plus Lotion  Eucerin TriLipid Replenishing Lotion  Keri Anti-Bacterial Hand Lotion  Keri Deep Conditioning Original Lotion Dry Skin Formula Softly Scented  Keri Deep Conditioning Original Lotion, Fragrance Free Sensitive Skin Formula  Keri Lotion Fast Absorbing Fragrance Free Sensitive  Skin Formula  Keri Lotion Fast Absorbing Softly Scented Dry Skin Formula  Keri Original Lotion  Keri Skin Renewal Lotion Keri Silky Smooth Lotion  Keri Silky Smooth Sensitive Skin Lotion  Nivea Body Creamy Conditioning Oil  Nivea Body Extra Enriched Lotion  Nivea Body Original Lotion  Nivea Body Sheer Moisturizing Lotion Nivea Crme  Nivea Skin Firming Lotion  NutraDerm 30 Skin Lotion  NutraDerm Skin Lotion  NutraDerm Therapeutic Skin Cream  NutraDerm Therapeutic Skin Lotion  ProShield Protective Hand Cream  Provon moisturizing lotion  Please read over the following fact sheets that you were given.

## 2023-06-04 ENCOUNTER — Encounter (HOSPITAL_COMMUNITY): Payer: Self-pay

## 2023-06-04 ENCOUNTER — Other Ambulatory Visit: Payer: Self-pay

## 2023-06-04 ENCOUNTER — Inpatient Hospital Stay (HOSPITAL_COMMUNITY)
Admission: RE | Admit: 2023-06-04 | Discharge: 2023-06-04 | Disposition: A | Discharge status: Home / Self Care | Payer: Medicaid Other | Source: Ambulatory Visit | Attending: Neurological Surgery

## 2023-06-04 ENCOUNTER — Other Ambulatory Visit (HOSPITAL_COMMUNITY): Payer: Self-pay

## 2023-06-04 VITALS — BP 112/75 | HR 104 | Temp 98.1°F | Resp 18 | Ht 69.0 in | Wt 210.0 lb

## 2023-06-04 DIAGNOSIS — Z01818 Encounter for other preprocedural examination: Secondary | ICD-10-CM

## 2023-06-04 DIAGNOSIS — Z01812 Encounter for preprocedural laboratory examination: Secondary | ICD-10-CM | POA: Diagnosis present

## 2023-06-04 LAB — BASIC METABOLIC PANEL
Anion gap: 9 (ref 5–15)
BUN: 12 mg/dL (ref 6–20)
CO2: 23 mmol/L (ref 22–32)
Calcium: 9.5 mg/dL (ref 8.9–10.3)
Chloride: 104 mmol/L (ref 98–111)
Creatinine, Ser: 0.73 mg/dL (ref 0.61–1.24)
GFR, Estimated: >60 mL/min (ref >60)
Glucose, Bld: 106 mg/dL — ABNORMAL HIGH (ref 70–99)
Potassium: 4.1 mmol/L (ref 3.5–5.1)
Sodium: 136 mmol/L (ref 135–145)

## 2023-06-04 LAB — CBC
HCT: 37.2 % — ABNORMAL LOW (ref 39.0–52.0)
Hemoglobin: 13.3 g/dL (ref 13.0–17.0)
MCH: 34.1 pg — ABNORMAL HIGH (ref 26.0–34.0)
MCHC: 35.8 g/dL (ref 30.0–36.0)
MCV: 95.4 fL (ref 80.0–100.0)
Platelets: 278 10*3/uL (ref 150–400)
RBC: 3.9 MIL/uL — ABNORMAL LOW (ref 4.22–5.81)
RDW: 12.8 % (ref 11.5–15.5)
WBC: 8 10*3/uL (ref 4.0–10.5)
nRBC: 0 % (ref 0.0–0.2)

## 2023-06-04 LAB — TYPE AND SCREEN
ABO/RH(D): O POS
Antibody Screen: NEGATIVE

## 2023-06-04 LAB — SURGICAL PCR SCREEN
MRSA, PCR: NEGATIVE
Staphylococcus aureus: NEGATIVE

## 2023-06-04 NOTE — Progress Notes (Signed)
PCP - Dr. Kathleen Lime Cardiologist -   PPM/ICD - denies Device Orders - na Rep Notified - na  Chest x-ray - 01/06/2023 EKG - 01/06/2023 Stress Test -  ECHO -  Cardiac Cath -   Sleep Study - denies CPAP - na  Non--diabetic  Blood Thinner Instructions: denies Aspirin Instructions:denies  ERAS Protcol -NPO  COVID TEST- na  Anesthesia review:NO   Patient denies shortness of breath, fever, cough and chest pain at PAT appointment   All instructions explained to the patient, with a verbal understanding of the material. Patient agrees to go over the instructions while at home for a better understanding. Patient also instructed to self quarantine after being tested for COVID-19. The opportunity to ask questions was provided.

## 2023-06-05 ENCOUNTER — Inpatient Hospital Stay (HOSPITAL_COMMUNITY)
Admission: RE | Admit: 2023-06-05 | Discharge: 2023-06-07 | DRG: 472 | Disposition: A | Discharge status: Rehabilitation Facility | Payer: Medicaid Other | Attending: Neurological Surgery | Admitting: Neurological Surgery

## 2023-06-05 ENCOUNTER — Ambulatory Visit (HOSPITAL_COMMUNITY): Payer: Medicaid Other

## 2023-06-05 ENCOUNTER — Other Ambulatory Visit: Payer: Self-pay

## 2023-06-05 ENCOUNTER — Ambulatory Visit (HOSPITAL_BASED_OUTPATIENT_CLINIC_OR_DEPARTMENT_OTHER): Payer: Self-pay | Admitting: Certified Registered"

## 2023-06-05 ENCOUNTER — Ambulatory Visit (HOSPITAL_COMMUNITY): Payer: Self-pay | Admitting: Certified Registered"

## 2023-06-05 ENCOUNTER — Encounter (HOSPITAL_COMMUNITY): Payer: Self-pay | Admitting: Neurological Surgery

## 2023-06-05 ENCOUNTER — Ambulatory Visit (HOSPITAL_COMMUNITY)
Admission: RE | Disposition: A | Discharge status: Rehabilitation Facility | Payer: Self-pay | Source: Home / Self Care | Attending: Neurological Surgery

## 2023-06-05 DIAGNOSIS — K592 Neurogenic bowel, not elsewhere classified: Secondary | ICD-10-CM | POA: Diagnosis present

## 2023-06-05 DIAGNOSIS — N319 Neuromuscular dysfunction of bladder, unspecified: Secondary | ICD-10-CM | POA: Diagnosis present

## 2023-06-05 DIAGNOSIS — M4712 Other spondylosis with myelopathy, cervical region: Secondary | ICD-10-CM | POA: Diagnosis not present

## 2023-06-05 DIAGNOSIS — Z79899 Other long term (current) drug therapy: Secondary | ICD-10-CM

## 2023-06-05 DIAGNOSIS — Z993 Dependence on wheelchair: Secondary | ICD-10-CM

## 2023-06-05 DIAGNOSIS — M4802 Spinal stenosis, cervical region: Principal | ICD-10-CM | POA: Diagnosis present

## 2023-06-05 DIAGNOSIS — F17210 Nicotine dependence, cigarettes, uncomplicated: Secondary | ICD-10-CM | POA: Diagnosis present

## 2023-06-05 DIAGNOSIS — R252 Cramp and spasm: Secondary | ICD-10-CM | POA: Diagnosis present

## 2023-06-05 DIAGNOSIS — F17290 Nicotine dependence, other tobacco product, uncomplicated: Secondary | ICD-10-CM | POA: Diagnosis present

## 2023-06-05 HISTORY — PX: ANTERIOR CERVICAL DECOMP/DISCECTOMY FUSION: SHX1161

## 2023-06-05 LAB — ABO/RH: ABO/RH(D): O POS

## 2023-06-05 SURGERY — ANTERIOR CERVICAL DECOMPRESSION/DISCECTOMY FUSION 2 LEVELS
Anesthesia: General

## 2023-06-05 MED ORDER — FENTANYL CITRATE (PF) 250 MCG/5ML IJ SOLN
INTRAMUSCULAR | Status: AC
  Filled 2023-06-05: qty 5

## 2023-06-05 MED ORDER — ACETAMINOPHEN 325 MG PO TABS
650.0000 mg | ORAL_TABLET | ORAL | Status: DC | PRN
Start: 2023-06-05 — End: 2023-06-07
  Administered 2023-06-07 (×2): 650 mg via ORAL
  Filled 2023-06-05 (×2): qty 2

## 2023-06-05 MED ORDER — FENTANYL CITRATE (PF) 100 MCG/2ML IJ SOLN: 25.0000 ug | INTRAMUSCULAR | Status: DC | PRN

## 2023-06-05 MED ORDER — CEFAZOLIN SODIUM-DEXTROSE 2-4 GM/100ML-% IV SOLN
INTRAVENOUS | Status: AC
  Filled 2023-06-05: qty 100

## 2023-06-05 MED ORDER — PHENYLEPHRINE 80 MCG/ML (10ML) SYRINGE FOR IV PUSH (FOR BLOOD PRESSURE SUPPORT)
PREFILLED_SYRINGE | INTRAVENOUS | Status: DC | PRN
  Administered 2023-06-05: 160 ug via INTRAVENOUS

## 2023-06-05 MED ORDER — ONDANSETRON HCL 4 MG/2ML IJ SOLN
4.0000 mg | Freq: Four times a day (QID) | INTRAMUSCULAR | Status: DC | PRN
Start: 2023-06-05 — End: 2023-06-07

## 2023-06-05 MED ORDER — THROMBIN (RECOMBINANT) 5000 UNITS EX SOLR
CUTANEOUS | Status: DC | PRN
  Administered 2023-06-05: 10 mL via TOPICAL

## 2023-06-05 MED ORDER — CHLORHEXIDINE GLUCONATE 0.12 % MT SOLN: 15.0000 mL | Freq: Once | OROMUCOSAL | Status: DC

## 2023-06-05 MED ORDER — SODIUM CHLORIDE 0.9% FLUSH
3.0000 mL | Freq: Two times a day (BID) | INTRAVENOUS | Status: DC
Start: 2023-06-05 — End: 2023-06-07
  Administered 2023-06-05 – 2023-06-07 (×3): 3 mL via INTRAVENOUS

## 2023-06-05 MED ORDER — MENTHOL 3 MG MT LOZG: 1.0000 | LOZENGE | OROMUCOSAL | Status: DC | PRN

## 2023-06-05 MED ORDER — KETOROLAC TROMETHAMINE 15 MG/ML IJ SOLN
15.0000 mg | Freq: Four times a day (QID) | INTRAMUSCULAR | Status: AC
  Administered 2023-06-05 – 2023-06-06 (×4): 15 mg via INTRAVENOUS
  Filled 2023-06-05 (×4): qty 1

## 2023-06-05 MED ORDER — HYDROMORPHONE HCL 1 MG/ML IJ SOLN
INTRAMUSCULAR | Status: DC | PRN
  Administered 2023-06-05: 1 mg via INTRAVENOUS

## 2023-06-05 MED ORDER — 0.9 % SODIUM CHLORIDE (POUR BTL) OPTIME
TOPICAL | Status: DC | PRN
  Administered 2023-06-05: 1000 mL

## 2023-06-05 MED ORDER — CHLORHEXIDINE GLUCONATE CLOTH 2 % EX PADS: 6.0000 | MEDICATED_PAD | Freq: Once | CUTANEOUS | Status: DC

## 2023-06-05 MED ORDER — CEFAZOLIN SODIUM-DEXTROSE 2-4 GM/100ML-% IV SOLN
2.0000 g | INTRAVENOUS | Status: AC
  Administered 2023-06-05: 2 g via INTRAVENOUS

## 2023-06-05 MED ORDER — MIDAZOLAM HCL 2 MG/2ML IJ SOLN
INTRAMUSCULAR | Status: DC | PRN
  Administered 2023-06-05: 2 mg via INTRAVENOUS

## 2023-06-05 MED ORDER — CEFAZOLIN SODIUM-DEXTROSE 2-4 GM/100ML-% IV SOLN
2.0000 g | Freq: Four times a day (QID) | INTRAVENOUS | Status: AC
  Administered 2023-06-05 – 2023-06-06 (×2): 2 g via INTRAVENOUS
  Filled 2023-06-05 (×2): qty 100

## 2023-06-05 MED ORDER — HYDROCODONE-ACETAMINOPHEN 5-325 MG PO TABS: 1.0000 | ORAL_TABLET | ORAL | Status: DC | PRN

## 2023-06-05 MED ORDER — SODIUM CHLORIDE 0.9 % IV SOLN
250.0000 mL | INTRAVENOUS | Status: AC
  Administered 2023-06-05: 250 mL via INTRAVENOUS

## 2023-06-05 MED ORDER — HYDROMORPHONE HCL 1 MG/ML IJ SOLN
INTRAMUSCULAR | Status: AC
  Filled 2023-06-05: qty 0.5

## 2023-06-05 MED ORDER — ORAL CARE MOUTH RINSE: 15.0000 mL | Freq: Once | OROMUCOSAL | Status: AC

## 2023-06-05 MED ORDER — DOCUSATE SODIUM 100 MG PO CAPS
100.0000 mg | ORAL_CAPSULE | Freq: Two times a day (BID) | ORAL | Status: DC
  Administered 2023-06-05 – 2023-06-06 (×2): 100 mg via ORAL
  Filled 2023-06-05 (×2): qty 1

## 2023-06-05 MED ORDER — LIDOCAINE 2% (20 MG/ML) 5 ML SYRINGE
INTRAMUSCULAR | Status: AC
Start: 2023-06-05 — End: ?
  Filled 2023-06-05: qty 5

## 2023-06-05 MED ORDER — THROMBIN 5000 UNITS EX SOLR
CUTANEOUS | Status: AC
  Filled 2023-06-05: qty 5000

## 2023-06-05 MED ORDER — POLYETHYLENE GLYCOL 3350 17 G PO PACK
17.0000 g | PACK | Freq: Every day | ORAL | Status: DC | PRN
  Administered 2023-06-07: 17 g via ORAL
  Filled 2023-06-05: qty 1

## 2023-06-05 MED ORDER — DEXMEDETOMIDINE HCL IN NACL 80 MCG/20ML IV SOLN
INTRAVENOUS | Status: DC | PRN
  Administered 2023-06-05 (×4): 10 ug via INTRAVENOUS

## 2023-06-05 MED ORDER — PROPOFOL 10 MG/ML IV BOLUS
INTRAVENOUS | Status: AC
  Filled 2023-06-05: qty 20

## 2023-06-05 MED ORDER — FLEET ENEMA RE ENEM
1.0000 | ENEMA | Freq: Once | RECTAL | Status: DC | PRN
Start: 2023-06-05 — End: 2023-06-07

## 2023-06-05 MED ORDER — ONDANSETRON HCL 4 MG/2ML IJ SOLN
INTRAMUSCULAR | Status: DC | PRN
  Administered 2023-06-05: 4 mg via INTRAVENOUS

## 2023-06-05 MED ORDER — SODIUM CHLORIDE 0.9% FLUSH: 3.0000 mL | INTRAVENOUS | Status: DC | PRN

## 2023-06-05 MED ORDER — LACTATED RINGERS IV SOLN: INTRAVENOUS | Status: DC

## 2023-06-05 MED ORDER — SUGAMMADEX SODIUM 200 MG/2ML IV SOLN
INTRAVENOUS | Status: DC | PRN
  Administered 2023-06-05: 200 mg via INTRAVENOUS

## 2023-06-05 MED ORDER — ROCURONIUM BROMIDE 10 MG/ML (PF) SYRINGE
PREFILLED_SYRINGE | INTRAVENOUS | Status: DC | PRN
  Administered 2023-06-05: 40 mg via INTRAVENOUS
  Administered 2023-06-05 (×3): 20 mg via INTRAVENOUS
  Administered 2023-06-05: 60 mg via INTRAVENOUS
  Administered 2023-06-05: 20 mg via INTRAVENOUS

## 2023-06-05 MED ORDER — ROCURONIUM BROMIDE 10 MG/ML (PF) SYRINGE
PREFILLED_SYRINGE | INTRAVENOUS | Status: AC
Start: 2023-06-05 — End: ?
  Filled 2023-06-05: qty 10

## 2023-06-05 MED ORDER — LIDOCAINE 2% (20 MG/ML) 5 ML SYRINGE
INTRAMUSCULAR | Status: DC | PRN
  Administered 2023-06-05: 60 mg via INTRAVENOUS

## 2023-06-05 MED ORDER — THROMBIN 5000 UNITS EX SOLR
CUTANEOUS | Status: AC
  Filled 2023-06-05: qty 15000

## 2023-06-05 MED ORDER — ONDANSETRON HCL 4 MG PO TABS
4.0000 mg | ORAL_TABLET | Freq: Four times a day (QID) | ORAL | Status: DC | PRN
Start: 2023-06-05 — End: 2023-06-07

## 2023-06-05 MED ORDER — CHLORHEXIDINE GLUCONATE 0.12 % MT SOLN
OROMUCOSAL | Status: AC
  Administered 2023-06-05: 15 mL via OROMUCOSAL
  Filled 2023-06-05: qty 15

## 2023-06-05 MED ORDER — ORAL CARE MOUTH RINSE: 15.0000 mL | Freq: Once | OROMUCOSAL | Status: DC

## 2023-06-05 MED ORDER — AMISULPRIDE (ANTIEMETIC) 5 MG/2ML IV SOLN: 10.0000 mg | Freq: Once | INTRAVENOUS | Status: DC | PRN

## 2023-06-05 MED ORDER — THROMBIN 5000 UNITS EX SOLR
OROMUCOSAL | Status: DC | PRN
  Administered 2023-06-05 (×2): 5 mL via TOPICAL

## 2023-06-05 MED ORDER — PHENOL 1.4 % MT LIQD
1.0000 | OROMUCOSAL | Status: DC | PRN
  Administered 2023-06-07: 1 via OROMUCOSAL
  Filled 2023-06-05: qty 177

## 2023-06-05 MED ORDER — SODIUM CHLORIDE 0.9 % IV SOLN: INTRAVENOUS | Status: DC

## 2023-06-05 MED ORDER — ALBUMIN HUMAN 5 % IV SOLN: INTRAVENOUS | Status: DC | PRN

## 2023-06-05 MED ORDER — BACLOFEN 5 MG HALF TABLET
5.0000 mg | ORAL_TABLET | Freq: Three times a day (TID) | ORAL | Status: DC | PRN
  Administered 2023-06-05: 5 mg via ORAL
  Filled 2023-06-05 (×2): qty 1

## 2023-06-05 MED ORDER — ACETAMINOPHEN 650 MG RE SUPP: 650.0000 mg | RECTAL | Status: DC | PRN

## 2023-06-05 MED ORDER — FENTANYL CITRATE (PF) 250 MCG/5ML IJ SOLN
INTRAMUSCULAR | Status: DC | PRN
  Administered 2023-06-05: 50 ug via INTRAVENOUS
  Administered 2023-06-05 (×2): 100 ug via INTRAVENOUS

## 2023-06-05 MED ORDER — DIAZEPAM 5 MG PO TABS
5.0000 mg | ORAL_TABLET | Freq: Four times a day (QID) | ORAL | Status: DC | PRN
  Administered 2023-06-05 – 2023-06-07 (×5): 5 mg via ORAL
  Filled 2023-06-05 (×5): qty 1

## 2023-06-05 MED ORDER — HYDROMORPHONE HCL 1 MG/ML IJ SOLN
0.5000 mg | INTRAMUSCULAR | Status: DC | PRN
  Administered 2023-06-05 – 2023-06-07 (×6): 0.5 mg via INTRAVENOUS
  Filled 2023-06-05 (×6): qty 0.5

## 2023-06-05 MED ORDER — DEXMEDETOMIDINE HCL IN NACL 80 MCG/20ML IV SOLN
INTRAVENOUS | Status: AC
Start: 2023-06-05 — End: ?
  Filled 2023-06-05: qty 20

## 2023-06-05 MED ORDER — CHLORHEXIDINE GLUCONATE 0.12 % MT SOLN: 15.0000 mL | Freq: Once | OROMUCOSAL | Status: AC

## 2023-06-05 MED ORDER — HYDROCODONE-ACETAMINOPHEN 5-325 MG PO TABS
2.0000 | ORAL_TABLET | ORAL | Status: DC | PRN
  Administered 2023-06-06 – 2023-06-07 (×5): 2 via ORAL
  Filled 2023-06-05 (×5): qty 2

## 2023-06-05 MED ORDER — MIDAZOLAM HCL 2 MG/2ML IJ SOLN
INTRAMUSCULAR | Status: AC
  Filled 2023-06-05: qty 2

## 2023-06-05 MED ORDER — PROPOFOL 10 MG/ML IV BOLUS
INTRAVENOUS | Status: DC | PRN
  Administered 2023-06-05: 30 mg via INTRAVENOUS
  Administered 2023-06-05: 20 mg via INTRAVENOUS
  Administered 2023-06-05: 200 mg via INTRAVENOUS

## 2023-06-05 SURGICAL SUPPLY — 56 items
BAG COUNTER SPONGE SURGICOUNT (BAG) ×1 IMPLANT
BENZOIN TINCTURE PRP APPL 2/3 (GAUZE/BANDAGES/DRESSINGS) IMPLANT
BIT DRILL NEURO 2X3.1 SFT TUCH (MISCELLANEOUS) ×1 IMPLANT
BLADE CLIPPER SURG (BLADE) IMPLANT
BUR CARBIDE MATCH 3.0 (BURR) ×1 IMPLANT
CANISTER SUCT 3000ML PPV (MISCELLANEOUS) ×1 IMPLANT
COVER MAYO STAND STRL (DRAPES) ×1 IMPLANT
DEVICE ENDSKLTN IMPL 16X14X7X6 (Cage) IMPLANT
DRAPE C-ARM 42X72 X-RAY (DRAPES) ×1 IMPLANT
DRAPE HALF SHEET 40X57 (DRAPES) IMPLANT
DRAPE LAPAROTOMY 100X72X124 (DRAPES) ×1 IMPLANT
DRAPE MICROSCOPE SLANT 54X150 (MISCELLANEOUS) ×1 IMPLANT
DRILL NEURO 2X3.1 SOFT TOUCH (MISCELLANEOUS) ×1
DRSG OPSITE POSTOP 4X6 (GAUZE/BANDAGES/DRESSINGS) IMPLANT
DURAPREP 6ML APPLICATOR 50/CS (WOUND CARE) ×1 IMPLANT
ELECT BLADE INSULATED 4IN (ELECTROSURGICAL) ×1
ELECT COATED BLADE 2.86 ST (ELECTRODE) ×1 IMPLANT
ELECT REM PT RETURN 9FT ADLT (ELECTROSURGICAL) ×1
ELECTRODE BLADE INSULATED 4IN (ELECTROSURGICAL) IMPLANT
ELECTRODE REM PT RTRN 9FT ADLT (ELECTROSURGICAL) ×1 IMPLANT
ENDOSKELETON IMPLANT 16X14X7X6 (Cage) ×2 IMPLANT
EVACUATOR 1/8 PVC DRAIN (DRAIN) IMPLANT
GAUZE 4X4 16PLY ~~LOC~~+RFID DBL (SPONGE) IMPLANT
GLOVE BIO SURGEON STRL SZ7 (GLOVE) ×1 IMPLANT
GLOVE BIOGEL PI IND STRL 7.5 (GLOVE) ×1 IMPLANT
GLOVE BIOGEL PI IND STRL 8 (GLOVE) ×1 IMPLANT
GLOVE ECLIPSE 6.5 STRL STRAW (GLOVE) IMPLANT
GLOVE ECLIPSE 8.0 STRL XLNG CF (GLOVE) ×2 IMPLANT
GLOVE EXAM NITRILE LRG STRL (GLOVE) IMPLANT
GLOVE EXAM NITRILE XL STR (GLOVE) IMPLANT
GLOVE EXAM NITRILE XS STR PU (GLOVE) IMPLANT
GOWN STRL REUS W/ TWL LRG LVL3 (GOWN DISPOSABLE) IMPLANT
GOWN STRL REUS W/ TWL XL LVL3 (GOWN DISPOSABLE) ×2 IMPLANT
GOWN STRL REUS W/TWL 2XL LVL3 (GOWN DISPOSABLE) IMPLANT
HEMOSTAT POWDER KIT SURGIFOAM (HEMOSTASIS) ×1 IMPLANT
KIT BASIN OR (CUSTOM PROCEDURE TRAY) ×1 IMPLANT
KIT TURNOVER KIT B (KITS) ×1 IMPLANT
NDL SPNL 18GX3.5 QUINCKE PK (NEEDLE) ×1 IMPLANT
NEEDLE SPNL 18GX3.5 QUINCKE PK (NEEDLE) ×1 IMPLANT
NS IRRIG 1000ML POUR BTL (IV SOLUTION) ×1 IMPLANT
PACK LAMINECTOMY NEURO (CUSTOM PROCEDURE TRAY) ×1 IMPLANT
PAD ARMBOARD 7.5X6 YLW CONV (MISCELLANEOUS) ×3 IMPLANT
PIN DISTRACTION 14MM (PIN) ×2 IMPLANT
PLATE 39MM (Plate) IMPLANT
PUTTY DBF 1CC CORTICAL FIBERS (Putty) IMPLANT
SCREW ANT CERV SD ZEVO 3.5X18 (Screw) IMPLANT
SCREW VA SD 3.5X16 (Screw) IMPLANT
SPONGE INTESTINAL PEANUT (DISPOSABLE) ×1 IMPLANT
SPONGE SURGIFOAM ABS GEL SZ50 (HEMOSTASIS) ×1 IMPLANT
STAPLER VISISTAT 35W (STAPLE) IMPLANT
STRIP CLOSURE SKIN 1/2X4 (GAUZE/BANDAGES/DRESSINGS) ×1 IMPLANT
SUT VIC AB 4-0 PS2 18 (SUTURE) IMPLANT
TAPE SURG TRANSPORE 1 IN (GAUZE/BANDAGES/DRESSINGS) ×1 IMPLANT
TOWEL GREEN STERILE (TOWEL DISPOSABLE) ×1 IMPLANT
TOWEL GREEN STERILE FF (TOWEL DISPOSABLE) ×1 IMPLANT
WATER STERILE IRR 1000ML POUR (IV SOLUTION) ×1 IMPLANT

## 2023-06-05 NOTE — Progress Notes (Signed)
Pt tolerated clear liquid diet well, no nausea, vomiting.  Diet advanced to regular per MD order.

## 2023-06-05 NOTE — Anesthesia Procedure Notes (Signed)
Procedure Name: Intubation Date/Time: 06/05/2023 11:44 AM  Performed by: Gus Puma, CRNAPre-anesthesia Checklist: Patient identified, Emergency Drugs available, Suction available and Patient being monitored Patient Re-evaluated:Patient Re-evaluated prior to induction Oxygen Delivery Method: Circle System Utilized Preoxygenation: Pre-oxygenation with 100% oxygen Induction Type: IV induction Ventilation: Mask ventilation without difficulty Laryngoscope Size: Glidescope and 4 Grade View: Grade I Tube type: Oral Tube size: 7.5 mm Number of attempts: 1 Airway Equipment and Method: Stylet and Oral airway Placement Confirmation: ETT inserted through vocal cords under direct vision, positive ETCO2 and breath sounds checked- equal and bilateral Secured at: 23 cm Tube secured with: Tape Dental Injury: Teeth and Oropharynx as per pre-operative assessment

## 2023-06-05 NOTE — H&P (Signed)
    Providing Compassionate, Quality Care - Together  NEUROSURGERY HISTORY & PHYSICAL   Leon Rodriguez is an 57 y.o. male.   Chief Complaint: Bilateral lower extremity weakness HPI: This is a 57 year old male with a history of worsening bilateral lower extremity numbness tingling and difficulty walking.  He has had balance difficulties that are progressively worsening for many months, progressing to now utilizing wheelchair.  He also complains of spasticity in his lower extremities with some intermittent numbness and tingling in his hands.  MRI cervical spine revealed C6-T1 severe cervical stenosis with cord compression consistent with myelopathy.  He presents today for surgical intervention.  Past Medical History:  Diagnosis Date   Arthritis    Hip pain     Past Surgical History:  Procedure Laterality Date   APPENDECTOMY     CIRCUMCISION     HIATAL HERNIA REPAIR     HIP SURGERY     at 51 and 57 yo    History reviewed. No pertinent family history. Social History:  reports that he has been smoking cigarettes and cigars. He has never used smokeless tobacco. He reports that he does not currently use alcohol. He reports current drug use. Drug: Marijuana.  Allergies: No Known Allergies  Medications Prior to Admission  Medication Sig Dispense Refill   Baclofen 5 MG TABS Take 1 tablet by mouth 3 (three) times daily as needed (muscle spasms).     famotidine (ZANTAC 360) 10 MG tablet Take 10 mg by mouth daily as needed for heartburn or indigestion.     acetaminophen (TYLENOL) 500 MG tablet Take 2 tablets (1,000 mg total) by mouth every 8 (eight) hours as needed for fever. (Patient not taking: Reported on 06/03/2023) 30 tablet 0    Results for orders placed or performed during the hospital encounter of 06/05/23 (from the past 48 hour(s))  ABO/Rh     Status: None   Collection Time: 06/05/23  9:15 AM  Result Value Ref Range   ABO/RH(D)      O POS Performed at [facility],  [address], [place], [place] [address]    No results found.  ROS All pertinent positives and negatives are listed in HPI above  Blood pressure 133/85, pulse (!) 109, temperature 98.6 F (37 C), resp. rate 18, height 5\' 9"  (1.753 m), weight 93 kg, SpO2 99%. 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  We discussed all risks, benefits and expected outcomes as well as alternatives to treatment.  Informed consent was obtained and witnessed.  I answered all of his questions.   Thank you for allowing me to participate in this patient's care.  Please do not hesitate to call with questions or concerns.   Monia Pouch, DO Neurosurgeon [facility] [phone]

## 2023-06-05 NOTE — Transfer of Care (Signed)
Immediate Anesthesia Transfer of Care Note  Patient: Leon Rodriguez  Procedure(s) Performed: ANTERIOR CERVICAL DISCECTOMY AND FUSION CERVICAL SIX-SEVEN/CERVICAL SEVEN-THORACIC ONE  Patient Location: PACU  Anesthesia Type:General  Level of Consciousness: sedated  Airway & Oxygen Therapy: Patient Spontanous Breathing  Post-op Assessment: Report given to RN and Post -op Vital signs reviewed and stable  Post vital signs: Reviewed and stable  Last Vitals:  Vitals Value Taken Time  BP 119/74 06/05/23 1509  Temp    Pulse 99 06/05/23 1513  Resp 21 06/05/23 1513  SpO2 93 % 06/05/23 1513  Vitals shown include unfiled device data.  Last Pain:  Vitals:   06/05/23 0904  PainSc: 10-Worst pain ever      Patients Stated Pain Goal: 1 (06/05/23 0904)  Complications: No notable events documented.

## 2023-06-05 NOTE — Progress Notes (Signed)
Orthopedic Tech Progress Note Patient Details:  Leon Rodriguez [birth year] [id number]  Aspen Cervical collar left at bedside with RN  Ortho Devices Type of Ortho Device: Aspen cervical collar Ortho Device/Splint Interventions: Theotis Burrow 06/05/2023, 3:23 PM

## 2023-06-05 NOTE — Anesthesia Preprocedure Evaluation (Signed)
Anesthesia Evaluation  Patient identified by MRN, date of birth, ID band Patient awake    Reviewed: Allergy & Precautions, NPO status , Patient's Chart, lab work & pertinent test results  Airway Mallampati: II  TM Distance: >3 FB Neck ROM: Limited    Dental  (+) Dental Advisory Given   Pulmonary Current Smoker and Patient abstained from smoking.   breath sounds clear to auscultation       Cardiovascular negative cardio ROS  Rhythm:Regular Rate:Normal     Neuro/Psych negative neurological ROS     GI/Hepatic negative GI ROS, Neg liver ROS,,,  Endo/Other  negative endocrine ROS    Renal/GU negative Renal ROS     Musculoskeletal  (+) Arthritis ,    Abdominal   Peds  Hematology negative hematology ROS (+)   Anesthesia Other Findings   Reproductive/Obstetrics                             Anesthesia Physical Anesthesia Plan  ASA: 2  Anesthesia Plan: General   Post-op Pain Management: Ofirmev IV (intra-op)*   Induction: Intravenous  PONV Risk Score and Plan: 1 and Dexamethasone, Ondansetron, Treatment may vary due to age or medical condition and Midazolam  Airway Management Planned: Oral ETT and Video Laryngoscope Planned  Additional Equipment: None  Intra-op Plan:   Post-operative Plan: Extubation in OR  Informed Consent: I have reviewed the patients History and Physical, chart, labs and discussed the procedure including the risks, benefits and alternatives for the proposed anesthesia with the patient or authorized representative who has indicated his/her understanding and acceptance.     Dental advisory given  Plan Discussed with: CRNA  Anesthesia Plan Comments:        Anesthesia Quick Evaluation

## 2023-06-05 NOTE — Progress Notes (Signed)
Pt c/o frequent lower extremities spasm. No new numbness or tingling. Per pt, he was experiencing same spasm at home. HR gets elevated to 120s-130s while spasm present. Baclofen given with no improvement.  Dr. Franky Macho notified of above findings. New orders given, will continue to monitor pt.

## 2023-06-05 NOTE — Op Note (Signed)
Providing Compassionate, Quality Care - Together  Date of service: 06/05/2023  PREOP DIAGNOSIS: Cervical stenosis with myelopathy C6-T1  POSTOP DIAGNOSIS: Same  PROCEDURE: 1. Arthrodesis C5-6-7, C7-T1, anterior interbody technique  2. Placement of intervertebral biomechanical device C6-7, C7-T1: Medtronic Titan titanium interbody C6-7: 7 x 14 mm, C7-T1 7 x 14 mm 3. Placement of anterior instrumentation consisting of interbody plate and screws -C6/T1: Medtronic Zevo 39 mm plate, 18 mm screws bilaterally at C6, C7, 16 mm screws bilaterally at T1. 4. Discectomy at C6-7, C7-T1 for decompression of spinal cord and exiting nerve roots  5. Use of morselized bone allograft  6. Use of intraoperative microscope 7.  Use of autograft, same incision  SURGEON: Dr. Monia Pouch, DO  ASSISTANT: Dr. Coletta Memos, MD; [name], PA  ANESTHESIA: General Endotracheal  EBL: 25 cc  SPECIMENS: None  DRAINS: Medium Hemovac  COMPLICATIONS: None immediate  CONDITION: Hemodynamically stable to PACU  HISTORY: Leon Rodriguez is a 57 y.o. y.o. male who initially presented to the outpatient clinic with signs and symptoms consistent with myelopathy.  He had been complaining of progressively lower extremity weakness, spasticity and difficulty walking.  This had progressed to the point of requiring a wheelchair. MRI demonstrated of the cervical spine revealed multilevel disc osteophyte complex most notably at C6-7 and C7-T1 with cord compression, with cord signal change at C7-T1.  Given his progressive symptoms and severe stenosis and cord signal change, I recommended surgical intervention in the form of an ACDF C6-T1. Treatment options were discussed including pain control, physical therapy or surgical intervention.  All risks, including infection, bleeding, need for more surgery, recurrent laryngeal injury, esophageal injury, CSF leak, further weakness numbness tingling or need for reoperation; benefits  and expected outcomes were discussed and agreed upon. After all questions were answered, informed consent was obtained.  PROCEDURE IN DETAIL: The patient was brought to the operating room and transferred to the operative table. After induction of general anesthesia, the patient was positioned on the operative table in the supine position with all pressure points meticulously padded. The skin of the neck was then prepped and draped in the usual sterile fashion.  Physician driven timeout was performed.  After timeout was conducted, left sided skin incision was then made sharply with a 10 blade and Bovie electrocautery was used to dissect the subcutaneous tissue until the platysma was identified. The platysma was then divided and undermined. The sternocleidomastoid muscle was then identified and, utilizing natural fascial planes in the neck, the prevertebral fascia was identified and the carotid sheath was retracted laterally and the trachea and esophagus retracted medially. Again using fluoroscopy, the correct disc space was identified. Bovie electrocautery was used to dissect in the subperiosteal plane and elevate the bilateral longus coli muscles. Self-retaining retractors were then placed under the longus coli muscles bilaterally. At this point, the microscope was draped and brought into the field, and the remainder of the case was done under the microscope using microdissecting technique.  Very large anterior osteophyte was removed from C6-7 and saved for autograft for later use.  ACDF C6-7: Distraction pins were placed in midline above and below the disc space.  The disc  space was placed in distraction.  The disc space was incised sharply and rongeurs were use to initially complete a discectomy. The high-speed drill was then used to complete discectomy until the posterior annulus was identified and removed and the posterior longitudinal ligament was identified. Using microcurettes, the PLL was elevated,  and  Kerrison rongeurs were used to remove the posterior longitudinal ligament and the ventral thecal sac was identified. Using a combination of curettes and ronguers, complete decompression of the thecal sac and exiting nerve roots at this level was completed, and verified using micro-nerve hook. The disc space was taken out of distraction.  Epidural hemostasis was achieved with Surgifoam.  Having completed our decompression, attention was turned to placement of the intervertebral device. Trial spacers were used to select a 7 mm graft. This graft was then filled with autograft and morcellized allograft, and inserted under live fluoroscopy.  ACDF C7-T1: Distraction pins were placed in midline above and below the disc space.  The disc  space was placed in distraction.  The disc space was incised sharply and rongeurs were use to initially complete a discectomy. The high-speed drill was then used to complete discectomy until the posterior annulus was identified and removed and the posterior longitudinal ligament was identified. Using microcurettes, the PLL was elevated, and Kerrison rongeurs were used to remove the posterior longitudinal ligament and the ventral thecal sac was identified. Using a combination of curettes and ronguers, complete decompression of the thecal sac and exiting nerve roots at this level was completed, and verified using micro-nerve hook. The disc space was taken out of distraction.  Epidural hemostasis was achieved with Surgifoam.  Having completed our decompression, attention was turned to placement of the intervertebral device. Trial spacers were used to select a 7 mm graft. This graft was then filled with autograft and morcellized allograft, and inserted under live fluoroscopy.  After placement of the intervertebral device, the above anterior cervical plate was selected, and placed across the interspaces. Using a high-speed drill, the cortex of the cervical vertebral bodies was  punctured, and screws inserted in C6, C7, T1. Final fluoroscopic images in AP and lateral projections were taken to confirm good hardware placement.  The plate was final tightened to the manufacturer's recommendation and the screws were locked in place.  At this point, after all counts were verified to be correct, meticulous hemostasis was secured using a combination of bipolar electrocautery and passive hemostatics.  Medium Hemovac was tunneled laterally and placed in the prevertebral space.  Skin was closed with staples.  Sterile dressing was applied.  The patient tolerated the procedure well and was extubated in the room and taken to the postanesthesia care unit in stable condition.

## 2023-06-06 DIAGNOSIS — M4712 Other spondylosis with myelopathy, cervical region: Secondary | ICD-10-CM | POA: Diagnosis present

## 2023-06-06 DIAGNOSIS — Z79899 Other long term (current) drug therapy: Secondary | ICD-10-CM | POA: Diagnosis not present

## 2023-06-06 DIAGNOSIS — N319 Neuromuscular dysfunction of bladder, unspecified: Secondary | ICD-10-CM | POA: Diagnosis present

## 2023-06-06 DIAGNOSIS — R258 Other abnormal involuntary movements: Secondary | ICD-10-CM | POA: Diagnosis not present

## 2023-06-06 DIAGNOSIS — R252 Cramp and spasm: Secondary | ICD-10-CM

## 2023-06-06 DIAGNOSIS — G8918 Other acute postprocedural pain: Secondary | ICD-10-CM | POA: Diagnosis not present

## 2023-06-06 DIAGNOSIS — R350 Frequency of micturition: Secondary | ICD-10-CM | POA: Diagnosis not present

## 2023-06-06 DIAGNOSIS — M4802 Spinal stenosis, cervical region: Secondary | ICD-10-CM | POA: Diagnosis present

## 2023-06-06 DIAGNOSIS — R339 Retention of urine, unspecified: Secondary | ICD-10-CM | POA: Diagnosis not present

## 2023-06-06 DIAGNOSIS — K5900 Constipation, unspecified: Secondary | ICD-10-CM | POA: Diagnosis not present

## 2023-06-06 DIAGNOSIS — J189 Pneumonia, unspecified organism: Secondary | ICD-10-CM | POA: Diagnosis not present

## 2023-06-06 DIAGNOSIS — F17210 Nicotine dependence, cigarettes, uncomplicated: Secondary | ICD-10-CM | POA: Diagnosis present

## 2023-06-06 DIAGNOSIS — I959 Hypotension, unspecified: Secondary | ICD-10-CM | POA: Diagnosis not present

## 2023-06-06 DIAGNOSIS — K592 Neurogenic bowel, not elsewhere classified: Secondary | ICD-10-CM

## 2023-06-06 DIAGNOSIS — Z4789 Encounter for other orthopedic aftercare: Secondary | ICD-10-CM | POA: Diagnosis not present

## 2023-06-06 DIAGNOSIS — R310 Gross hematuria: Secondary | ICD-10-CM | POA: Diagnosis not present

## 2023-06-06 DIAGNOSIS — M62831 Muscle spasm of calf: Secondary | ICD-10-CM | POA: Diagnosis not present

## 2023-06-06 DIAGNOSIS — Z993 Dependence on wheelchair: Secondary | ICD-10-CM | POA: Diagnosis not present

## 2023-06-06 DIAGNOSIS — G959 Disease of spinal cord, unspecified: Secondary | ICD-10-CM | POA: Diagnosis not present

## 2023-06-06 DIAGNOSIS — M79661 Pain in right lower leg: Secondary | ICD-10-CM | POA: Diagnosis not present

## 2023-06-06 DIAGNOSIS — F17290 Nicotine dependence, other tobacco product, uncomplicated: Secondary | ICD-10-CM | POA: Diagnosis present

## 2023-06-06 MED ORDER — BACLOFEN 10 MG PO TABS
10.0000 mg | ORAL_TABLET | Freq: Three times a day (TID) | ORAL | Status: DC
Start: 2023-06-06 — End: 2023-06-07
  Administered 2023-06-06 – 2023-06-07 (×3): 10 mg via ORAL
  Filled 2023-06-06 (×4): qty 1

## 2023-06-06 MED ORDER — SENNOSIDES-DOCUSATE SODIUM 8.6-50 MG PO TABS
2.0000 | ORAL_TABLET | Freq: Every day | ORAL | Status: DC
Start: 2023-06-06 — End: 2023-06-07
  Administered 2023-06-06: 2 via ORAL
  Filled 2023-06-06: qty 2

## 2023-06-06 MED ORDER — BACLOFEN 10 MG PO TABS: 10.0000 mg | ORAL_TABLET | Freq: Three times a day (TID) | ORAL | Status: DC | PRN

## 2023-06-06 MED FILL — Thrombin For Soln 5000 Unit: CUTANEOUS | Qty: 2 | Status: AC

## 2023-06-06 MED FILL — Thrombin For Soln 5000 Unit: CUTANEOUS | Qty: 5000 | Status: AC

## 2023-06-06 NOTE — PMR Pre-admission (Signed)
PMR Admission Coordinator Pre-Admission Assessment  Patient: Leon Rodriguez is an 57 y.o., male MRN: [id number] DOB: 1966/05/17 Height: 5\' 9"  (175.3 cm) Weight: 93 kg              Insurance Information HMO:     PPO:      PCP:      IPA:      80/20:      OTHER: Group [id number] PRIMARY: BCBS [facility] Medicaid      Policy#: [id number]      Subscriber: self CM Name: Brett Canales      Phone#: [phone]     Fax#: [phone] Pre-Cert#: [id number] from Brett Canales with approval from 06/07/23 to 06/13/23 with update due on 06/14/23 to fax [phone]      Employer: Currently on LOA Benefits:  Phone #: [phone]     Name: Verified 06/06/23 Eff. Date: 03/26/23     Deduct: $0      Out of Pocket Max: $0      Life Max: n/a  CIR: 100%      SNF: 100% Outpatient:       Co-Pay: $4 copay/visit Home Health: 100%      Co-Pay: none DME: 100%     Co-Pay: none Providers: in network  SECONDARY:       Policy#:       Phone#:   Artist:       Phone#:   The Data processing manager" for patients in Inpatient Rehabilitation Facilities with attached "Privacy Act Statement-Health Care Records" was provided and verbally reviewed with: Patient  Emergency Contact Information Contact Information     Name Relation Home Work Mobile   [name],[name] Significant other   [phone]      Other Contacts   None on File    Current Medical History  Patient Admitting Diagnosis: C6-T1 cervical myelopathy  History of Present Illness:  A 57 y.o. male with a history of worsening bilateral lower extremity weakness and sensory loss progressing over several months.  He had progressed to the point where patient was requiring a wheelchair for mobility.  Patient developed spasticity as well.  MRI of the cervical spine revealed a C6-T1 severe cervical stenosis with cord compression and myelopathy.  Patient presented on 06/05/2023 for C6-T1 ACDF by Dr. Jake Samples.  Patient with significant lower extremity  spasms this morning.  Valium was added and baclofen was increased.  He remains in a cervical collar.  PT/OT evaluations completed with recommendations for acute inpatient rehab admission.    Patient's medical record from [facility] has been reviewed by the rehabilitation admission coordinator and physician.  Past Medical History  Past Medical History:  Diagnosis Date   Arthritis    Hip pain     Has the patient had major surgery during 100 days prior to admission? Yes  Family History  family history is not on file.   Current Medications   Current Facility-Administered Medications:    acetaminophen (TYLENOL) tablet 650 mg, 650 mg, Oral, Q4H PRN, 650 mg at 06/07/23 0739 **OR** acetaminophen (TYLENOL) suppository 650 mg, 650 mg, Rectal, Q4H PRN, [name] [place], PA-C   baclofen (LIORESAL) tablet 20 mg, 20 mg, Oral, TID, [name] [place], PA-C   diazepam (VALIUM) tablet 5 mg, 5 mg, Oral, Q6H PRN, Dawley, Troy C, DO, 5 mg at 06/07/23 0739   heparin injection 5,000 Units, 5,000 Units, Subcutaneous, Q8H, Tomlinson, Sara Caylin, PA-C   HYDROcodone-acetaminophen (NORCO/VICODIN) 5-325 MG per tablet 1 tablet, 1 tablet, Oral,  Q4H PRN, [name] [place], PA-C   HYDROcodone-acetaminophen (NORCO/VICODIN) 5-325 MG per tablet 2 tablet, 2 tablet, Oral, Q4H PRN, Clovis Riley, PA-C, 2 tablet at 06/07/23 4696   HYDROmorphone (DILAUDID) injection 0.5 mg, 0.5 mg, Intravenous, Q3H PRN, Clovis Riley, PA-C, 0.5 mg at 06/07/23 0417   menthol-cetylpyridinium (CEPACOL) lozenge 3 mg, 1 lozenge, Oral, PRN **OR** phenol (CHLORASEPTIC) mouth spray 1 spray, 1 spray, Mouth/Throat, PRN, [name] [place], PA-C, 1 spray at 06/07/23 0911   ondansetron (ZOFRAN) tablet 4 mg, 4 mg, Oral, Q6H PRN **OR** ondansetron (ZOFRAN) injection 4 mg, 4 mg, Intravenous, Q6H PRN, [name] [place], PA-C   polyethylene glycol (MIRALAX / GLYCOLAX) packet 17 g, 17 g, Oral, Daily PRN,  [name] [place], PA-C, 17 g at 06/07/23 2952   senna-docusate (Senokot-S) tablet 2 tablet, 2 tablet, Oral, QHS, Ranelle Oyster, MD, 2 tablet at 06/06/23 2127   sodium chloride flush (NS) 0.9 % injection 3 mL, 3 mL, Intravenous, Q12H, [name] [place], PA-C, 3 mL at 06/07/23 0910   sodium chloride flush (NS) 0.9 % injection 3 mL, 3 mL, Intravenous, PRN, [name] [place], PA-C   sodium phosphate (FLEET) enema 1 enema, 1 enema, Rectal, Once PRN, Clovis Riley, PA-C  Patients Current Diet:  Diet Order             Diet regular Room service appropriate? Yes; Fluid consistency: Thin  Diet effective now                   Precautions / Restrictions Precautions Precautions: Fall, Cervical Precaution Booklet Issued: Yes (comment) Precaution Comments: reviewed log roll, brace use Cervical Brace: Hard collar (can be removed for showering, toilet transfers, and in bed.  Can be donned and doffed EOB.) Restrictions Weight Bearing Restrictions Per Provider Order: No   Has the patient had 2 or more falls or a fall with injury in the past year?Yes  Prior Activity Level Household: Went out for appointments only.  Prior Functional Level Prior Function Prior Level of Function : Needs assist Mobility Comments: pt states he stays on the second floor and only goes downstairs for appts, had a fall on stairs a couple of months ago. pt has been walking short distances with RW, and states he has mostly been hopping and using RW like crutches to maintain bilat LE NWB during swing phase of gait. pt reports 4 falls in the past 6 months ADLs Comments: wife does dressing and LB washup, has been getting in and out of tub but has had a fall there  Self Care: Did the patient need help bathing, dressing, using the toilet or eating?  Needed some help  Indoor Mobility: Did the patient need assistance with walking from room to room (with or without device)? Needed some help  Stairs:  Did the patient need assistance with internal or external stairs (with or without device)? Needed some help  Functional Cognition: Did the patient need help planning regular tasks such as shopping or remembering to take medications? Independent  Patient Information Are you of Hispanic, Latino/a,or Spanish origin?: A. No, not of Hispanic, Latino/a, or Spanish origin What is your race?: B. Black or African American Do you need or want an interpreter to communicate with a doctor or health care staff?: 0. No  Patient's Response To:  Health Literacy and Transportation Is the patient able to respond to health literacy and transportation needs?: Yes Health Literacy - How often do you need to have someone  help you when you read instructions, pamphlets, or other written material from your doctor or pharmacy?: Never In the past 12 months, has lack of transportation kept you from medical appointments or from getting medications?: No In the past 12 months, has lack of transportation kept you from meetings, work, or from getting things needed for daily living?: No  Journalist, newspaper / Equipment Home Equipment: Information systems manager, Higher education careers adviser held shower head, Agricultural consultant (2 wheels), [employer] - single point, Wheelchair - manual, Crutches, Toilet riser  Prior Device Use: Indicate devices/aids used by the patient prior to current illness, exacerbation or injury? Manual wheelchair and Walker  Current Functional Level Cognition  Overall Cognitive Status: Within Functional Limits for tasks assessed Orientation Level: Oriented X4    Extremity Assessment (includes Sensation/Coordination)  Upper Extremity Assessment: Overall WFL for tasks assessed  Lower Extremity Assessment: Defer to PT evaluation    ADLs  Overall ADL's : Needs assistance/impaired Eating/Feeding: Independent, Sitting Grooming: Wash/dry hands, Wash/dry face, Sitting, Set up Upper Body Bathing: Supervision/ safety, Sitting Lower Body Bathing:  Moderate assistance, Sit to/from stand Upper Body Dressing : Supervision/safety, Sitting Lower Body Dressing: Maximal assistance, Sit to/from stand Toilet Transfer: Minimal assistance, Ambulation, Rolling walker (2 wheels), BSC/3in1 Toilet Transfer Details (indicate cue type and reason): simulated Toileting- Clothing Manipulation and Hygiene: Sit to/from stand, Moderate assistance Toileting - Clothing Manipulation Details (indicate cue type and reason): simulated Functional mobility during ADLs: Minimal assistance, Rolling walker (2 wheels) General ADL Comments: Pt with decreased ability to cross each LE over the opposite knee for dressing tasks.  Increased spasms noted with attempts to cross them over.  Sit to stand with increased stiffness in hips and knees and pt relying on UEs to push him up from the chair and then working his LEs back to allow for standing balance.  Provided handout for education on cervical precautions for selfcare and bed mobility.    Mobility  Overal bed mobility: Needs Assistance Bed Mobility: Rolling, Sidelying to Sit Rolling: Min assist, Used rails Sidelying to sit: Min assist, HOB elevated General bed mobility comments: assist for trunk and LE management, cues for sequencing task    Transfers  Overall transfer level: Needs assistance Equipment used: Rolling walker (2 wheels) Transfers: Sit to/from Stand, Bed to chair/wheelchair/BSC Sit to Stand: Min assist Bed to/from chair/wheelchair/BSC transfer type:: Step pivot Step pivot transfers: Min assist General transfer comment: Pt able to use UEs primarily for sit to stand transtion up to standing with RW for support.  He would then walk his LEs back toward the chair to achieve upright balance posture with knees extended throughout the process.  Flexed trunk noted in standing.    Ambulation / Gait / Stairs / Wheelchair Mobility  Ambulation/Gait General Gait Details: nt, LE spasms and pain    Posture / Balance  Balance Overall balance assessment: Needs assistance, History of Falls Sitting-balance support: No upper extremity supported, Feet supported Sitting balance-Leahy Scale: Fair Standing balance support: Bilateral upper extremity supported, During functional activity Standing balance-Leahy Scale: Poor Standing balance comment: Pt needs BUE support for balance in standing    Special needs/care consideration Skin Post op neck incision dressing in place      Previous Home Environment (from acute therapy documentation) Living Arrangements: Spouse/significant other Available Help at Discharge: Family, Available 24 hours/day (iniitally has assist from significant other 24 hour but she plans to go back to work in January) Type of Home: House Home Layout: Two level, Bed/bath upstairs, 1/2 bath on  main level Home Access: Stairs to enter Entrance Stairs-Rails: None Entrance Stairs-Number of Steps: 2 Bathroom Shower/Tub: Engineer, manufacturing systems: Standard  Discharge Living Setting Plans for Discharge Living Setting: Patient's home, House, Lives with (comment) (Lives with SO [name].) Type of Home at Discharge: House Discharge Home Layout: Two level, 1/2 bath on main level, Bed/bath upstairs Alternate Level Stairs-Rails: Right Alternate Level Stairs-Number of Steps: 16 stairs Discharge Home Access: Stairs to enter Entrance Stairs-Rails: None Entrance Stairs-Number of Steps: 2 steps  Social/Family/Support Systems Patient Roles: Partner (Has SO, sister, brother) Solicitor Information: [name] Laural Benes - SO [phone] Anticipated Caregiver: SO Ability/Limitations of Caregiver: [name] plans to return to work in January 2025. Caregiver Availability: Intermittent Discharge Plan Discussed with Primary Caregiver: Yes Is Caregiver In Agreement with Plan?: Yes Does Caregiver/Family have Issues with Lodging/Transportation while Pt is in Rehab?: No   Goals Patient/Family Goal for Rehab:  PT/OT mod I and supervision goals Expected length of stay: 18-24 days Pt/Family Agrees to Admission and willing to participate: Yes Program Orientation Provided & Reviewed with Pt/Caregiver Including Roles  & Responsibilities: Yes   Decrease burden of Care through IP rehab admission: N/A   Possible need for SNF placement upon discharge: Not anticipated   Patient Condition: This patient's condition remains as documented in the consult dated 06/06/23, in which the Rehabilitation Physician determined and documented that the patient's condition is appropriate for intensive rehabilitative care in an inpatient rehabilitation facility. Will admit to inpatient rehab today.  Preadmission Screen Completed By:  Trish Mage, RN, 06/07/2023 10:02 AM ______________________________________________________________________   Discussed status with Dr. Riley Kill on 06/07/23 at 10:02 AM and received approval for admission today.  Admission Coordinator:  Domingo Pulse, time 10:02 AM/Date 06/07/23

## 2023-06-06 NOTE — Evaluation (Signed)
Occupational Therapy Evaluation Patient Details Name: Leon Rodriguez MRN: [id number] DOB: 02/28/1966 Today's Date: 06/06/2023   History of Present Illness 57 yo male s/p C6-T1 ACDF on 12/11 for severe cervical stenosis with cord compression consistent with myelopathy. PMH includes OA, hiatal hernia repair.  Increased extensor spasms in BLE noted.   Clinical Impression   Pt currently a min assist level for sit to stand from the recliner and for simulated toilet transfers with inability to complete normal hip and knee flexion during transition.  Knees remained in extension throughout transition and then he walked his legs back underneath him.  Mod to max assist was needed for simulated LB selfcare sit to stand.  Increased spasms noted in the LEs with attempted AROM.  Prior to admission pt had been declining in independence having to rely on use of a RW and wheelchair at times with history of multiple recent falls.  Feel he will benefit from acute care OT at this time to help provide education and strengthening in order to reduce burden of care.  Pt lives with his spouse who is helping now but will go back to work in January.  Feel to maximize progress he would benefit from post acute OT at inpatient follow up therapy, >3 hours/day.          If plan is discharge home, recommend the following: A little help with walking and/or transfers;A lot of help with bathing/dressing/bathroom;Assistance with cooking/housework;Assist for transportation;Help with stairs or ramp for entrance    Functional Status Assessment  Patient has had a recent decline in their functional status and demonstrates the ability to make significant improvements in function in a reasonable and predictable amount of time.  Equipment Recommendations  Other (comment) (TBD next venue of care)    Recommendations for Other Services Rehab consult     Precautions / Restrictions Precautions Precautions: Fall;Cervical Precaution  Booklet Issued: Yes (comment) Precaution Comments: reviewed log roll, brace use Required Braces or Orthoses: Cervical Brace Cervical Brace: Hard collar (can be removed for showering, toilet transfers, and in bed.  Can be donned and doffed EOB.) Restrictions Weight Bearing Restrictions Per Provider Order: No      Mobility Bed Mobility                    Transfers Overall transfer level: Needs assistance Equipment used: Rolling walker (2 wheels) Transfers: Sit to/from Stand, Bed to chair/wheelchair/BSC Sit to Stand: Min assist     Step pivot transfers: Min assist     General transfer comment: Pt able to use UEs primarily for sit to stand transtion up to standing with RW for support.  He would then walk his LEs back toward the chair to achieve upright balance posture with knees extended throughout the process.  Flexed trunk noted in standing.      Balance Overall balance assessment: Needs assistance, History of Falls Sitting-balance support: No upper extremity supported, Feet supported Sitting balance-Leahy Scale: Fair     Standing balance support: Bilateral upper extremity supported, During functional activity Standing balance-Leahy Scale: Poor Standing balance comment: Pt needs BUE support for balance in standing                           ADL either performed or assessed with clinical judgement   ADL Overall ADL's : Needs assistance/impaired Eating/Feeding: Independent;Sitting   Grooming: Wash/dry hands;Wash/dry face;Sitting;Set up   Upper Body Bathing: Supervision/ safety;Sitting   Lower Body  Bathing: Moderate assistance;Sit to/from stand   Upper Body Dressing : Supervision/safety;Sitting   Lower Body Dressing: Maximal assistance;Sit to/from stand   Toilet Transfer: Minimal assistance;Ambulation;Rolling walker (2 wheels);BSC/3in1 Toilet Transfer Details (indicate cue type and reason): simulated Toileting- Clothing Manipulation and Hygiene: Sit  to/from stand;Moderate assistance Toileting - Clothing Manipulation Details (indicate cue type and reason): simulated     Functional mobility during ADLs: Minimal assistance;Rolling walker (2 wheels) General ADL Comments: Pt with decreased ability to cross each LE over the opposite knee for dressing tasks.  Increased spasms noted with attempts to cross them over.  Sit to stand with increased stiffness in hips and knees and pt relying on UEs to push him up from the chair and then working his LEs back to allow for standing balance.  Provided handout for education on cervical precautions for selfcare and bed mobility.     Vision Baseline Vision/History: 0 No visual deficits Ability to See in Adequate Light: 0 Adequate Patient Visual Report: No change from baseline Vision Assessment?: No apparent visual deficits     Perception Perception: Within Functional Limits       Praxis Praxis: WFL       Pertinent Vitals/Pain Pain Assessment Pain Assessment: 0-10 Pain Score: 7  Pain Location: LE and back Pain Descriptors / Indicators: Spasm Pain Intervention(s): Repositioned     Extremity/Trunk Assessment Upper Extremity Assessment Upper Extremity Assessment: Overall WFL for tasks assessed   Lower Extremity Assessment Lower Extremity Assessment: Defer to PT evaluation   Cervical / Trunk Assessment Cervical / Trunk Assessment: Neck Surgery (Aspen collar in place)   Communication Communication Communication: No apparent difficulties Cueing Techniques: Verbal cues;Gestural cues   Cognition Arousal: Alert Behavior During Therapy: WFL for tasks assessed/performed Overall Cognitive Status: Within Functional Limits for tasks assessed                                       General Comments  anterior cervical incision covered with honeycomb dressing, intact and dry throughout session            Home Living Family/patient expects to be discharged to:: Private  residence Living Arrangements: Spouse/significant other Available Help at Discharge: Family;Available 24 hours/day (iniitally has assist from significant other 24 hour but she plans to go back to work in January) Type of Home: House Home Access: Stairs to enter [employer] of Steps: 2 Entrance Stairs-Rails: None Home Layout: Two level;Bed/bath upstairs;1/2 bath on main level     Bathroom Shower/Tub: Chief Strategy Officer: Standard     Home Equipment: Shower seat;Hand held Programmer, systems (2 wheels);Cane - single point;Wheelchair - manual;Crutches;Toilet riser          Prior Functioning/Environment Prior Level of Function : Needs assist             Mobility Comments: pt states he stays on the second floor and only goes downstairs for appts, had a fall on stairs a couple of months ago. pt has been walking short distances with RW, and states he has mostly been hopping and using RW like crutches to maintain bilat LE NWB during swing phase of gait. pt reports 4 falls in the past 6 months ADLs Comments: wife does dressing and LB washup, has been getting in and out of tub but has had a fall there        OT Problem List: Decreased strength;Impaired balance (  sitting and/or standing);Pain;Impaired tone;Decreased activity tolerance;Decreased coordination;Decreased knowledge of use of DME or AE      OT Treatment/Interventions: Self-care/ADL training;DME and/or AE instruction;Therapeutic activities;Balance training;Patient/family education;Neuromuscular education    OT Goals(Current goals can be found in the care plan section) Acute Rehab OT Goals Patient Stated Goal: Pt wants to get back to being able to walk and do for himself without pain and spasms. OT Goal Formulation: With patient/family Time For Goal Achievement: 06/20/23 Potential to Achieve Goals: Good  OT Frequency: Min 1X/week       AM-PAC OT "6 Clicks" Daily Activity     Outcome  Measure Help from another person eating meals?: None Help from another person taking care of personal grooming?: A Little Help from another person toileting, which includes using toliet, bedpan, or urinal?: A Lot Help from another person bathing (including washing, rinsing, drying)?: A Lot Help from another person to put on and taking off regular upper body clothing?: A Little Help from another person to put on and taking off regular lower body clothing?: A Lot 6 Click Score: 16   End of Session Equipment Utilized During Treatment: Gait belt;Rolling walker (2 wheels);Cervical collar Nurse Communication: Mobility status  Activity Tolerance: Patient tolerated treatment well Patient left: in chair;with call bell/phone within reach;with family/visitor present;with nursing/sitter in room  OT Visit Diagnosis: Unsteadiness on feet (R26.81);Muscle weakness (generalized) (M62.81);Other abnormalities of gait and mobility (R26.89);Repeated falls (R29.6);Pain Pain - Right/Left:  (BLE) Pain - part of body: Leg                Time: 1104-1150 OT Time Calculation (min): 46 min Charges:  OT General Charges $OT Visit: 1 Visit OT Evaluation $OT Eval Moderate Complexity: 1 Mod OT Treatments $Self Care/Home Management : 23-37 mins  Perrin Maltese, OTR/L Acute Rehabilitation Services  Office [phone] 06/06/2023

## 2023-06-06 NOTE — Progress Notes (Signed)
    Providing Compassionate, Quality Care - Together   NEUROSURGERY PROGRESS NOTE     S: Pt c/o significant LE spasms.    O: EXAM:  BP 120/76 (BP Location: Right Arm)   Pulse (!) 111   Temp 98 F (36.7 C) (Oral)   Resp 16   Ht 5\' 9"  (1.753 m)   Wt 93 kg   SpO2 94%   BMI 30.27 kg/m     Awake, alert, oriented  Speech fluent, appropriate  CNs grossly intact  BUE 5/5. BLE 4-/5 SILTx4 Dressing c/d/i   ASSESSMENT:  57 y.o. with  -C6-T1 CSM s/p ACDF C6-T1, POD#1    PLAN: -Valium added, Baclofen increased for spasticity -Therapies as tolerated -Cont collar -CIR potentially for dc planning. Consult placed.  -Call w/ questions/concerns.   [name], [facility]

## 2023-06-06 NOTE — Progress Notes (Addendum)
IP rehab admissions - I met with patient, his sister, brother and his SO at the bedside.  All are in agreement to CIR.  I gave them rehab booklets and explained CIR process.  I have opened the case with insurance carrier today and will await response from insurance case manager.  Please see rehab consult done today by Dr. Riley Kill.  Call for questions.  [phone]  We have approval for acute inpatient rehab admission.  We will await bed availability.  [phone]

## 2023-06-06 NOTE — Evaluation (Signed)
Physical Therapy Evaluation Patient Details Name: Leon Rodriguez MRN: [id number] DOB: 05-Feb-1966 Today's Date: 06/06/2023  History of Present Illness  57 yo male s/p C6-T1 ACDF on 12/11 for severe cervical stenosis with cord compression consistent with myelopathy. PMH includes OA, hiatal hernia repair.  Clinical Impression   Pt presents with LE weakness, impaired balance with history of multiple falls, BLE pain and min surgical-related pain, impaired activity tolerance, bilat LE spasms with mobility. Pt to benefit from acute PT to address deficits. Pt overall requiring light assist for transfer-level mobility, has had a steady decline over the past couple of months due to myelopathy. Pt and family hopeful intensive inpatient follow up therapy, >3 hours/day, pt is a great candidate for this given independent PLOF. PT to progress mobility as tolerated, and will continue to follow acutely.          If plan is discharge home, recommend the following: A lot of help with walking and/or transfers;A lot of help with bathing/dressing/bathroom   Can travel by private vehicle        Equipment Recommendations None recommended by PT  Recommendations for Other Services       Functional Status Assessment Patient has had a recent decline in their functional status and demonstrates the ability to make significant improvements in function in a reasonable and predictable amount of time.     Precautions / Restrictions Precautions Precautions: Fall;Cervical Precaution Comments: reviewed log roll, brace use Required Braces or Orthoses: Cervical Brace Cervical Brace: Hard collar (when OOB, can remove for showers or in bed) Restrictions Weight Bearing Restrictions Per Provider Order: No      Mobility  Bed Mobility Overal bed mobility: Needs Assistance Bed Mobility: Rolling, Sidelying to Sit Rolling: Min assist, Used rails Sidelying to sit: Min assist, HOB elevated       General bed mobility  comments: assist for trunk and LE management, cues for sequencing task    Transfers Overall transfer level: Needs assistance Equipment used: Rolling walker (2 wheels) Transfers: Sit to/from Stand, Bed to chair/wheelchair/BSC Sit to Stand: Min assist   Step pivot transfers: Min assist       General transfer comment: assist for steadying when transitioning hands from EOB to RW, steadying during pivot to recliner towards pt's R. Pt with bilat LE NWB during swing phase of gait, using UEs on RW to offweight LEs    Ambulation/Gait               General Gait Details: nt, LE spasms and pain  Stairs            Wheelchair Mobility     Tilt Bed    Modified Rankin (Stroke Patients Only)       Balance Overall balance assessment: Needs assistance, History of Falls Sitting-balance support: No upper extremity supported, Feet supported Sitting balance-Leahy Scale: Fair     Standing balance support: Bilateral upper extremity supported, During functional activity Standing balance-Leahy Scale: Poor                               Pertinent Vitals/Pain Pain Assessment Pain Assessment: Faces Faces Pain Scale: Hurts even more Pain Location: LE spasms Pain Descriptors / Indicators: Spasm, Cramping Pain Intervention(s): Monitored during session, Repositioned, Limited activity within patient's tolerance    Home Living Family/patient expects to be discharged to:: Private residence Living Arrangements: Spouse/significant other Available Help at Discharge: Family Type of Home: House Home Access:  Stairs to enter Entrance Stairs-Rails: None Entrance Stairs-Number of Steps: 2   Home Layout: Two level;Bed/bath upstairs Home Equipment: Shower seat;Hand held Programmer, systems (2 wheels);Cane - single point;Wheelchair - manual;Crutches      Prior Function Prior Level of Function : Needs assist             Mobility Comments: pt states he stays on the  second floor and only goes downstairs for appts, had a fall on stairs a couple of months ago. pt has been walking short distances with RW, and states he has mostly been hopping and using RW like crutches to maintain bilat LE NWB during swing phase of gait. pt reports 4 falls in the past 6 months ADLs Comments: wife does dressing and LB washup, has been getting in and out of tub but has had a fall there     Extremity/Trunk Assessment   Upper Extremity Assessment Upper Extremity Assessment: Defer to OT evaluation    Lower Extremity Assessment Lower Extremity Assessment: Generalized weakness (assessment limited by LE spasms into hip and knee flexion, has at least 3/5 hip flexors, knee flex/extensors. full ROM DF/PF)    Cervical / Trunk Assessment Cervical / Trunk Assessment: Neck Surgery  Communication   Communication Communication: No apparent difficulties Cueing Techniques: Verbal cues;Gestural cues  Cognition Arousal: Alert Behavior During Therapy: WFL for tasks assessed/performed Overall Cognitive Status: Within Functional Limits for tasks assessed                                          General Comments General comments (skin integrity, edema, etc.): anterior cervical incision covered with honeycomb dressing, intact and dry throughout session    Exercises     Assessment/Plan    PT Assessment Patient needs continued PT services  PT Problem List Decreased strength;Decreased balance;Decreased activity tolerance;Decreased coordination;Decreased range of motion;Decreased cognition;Pain;Cardiopulmonary status limiting activity;Decreased safety awareness;Decreased knowledge of use of DME;Decreased knowledge of precautions;Decreased mobility       PT Treatment Interventions DME instruction;Therapeutic activities;Gait training;Therapeutic exercise;Patient/family education;Balance training;Stair training;Functional mobility training;Neuromuscular re-education    PT  Goals (Current goals can be found in the Care Plan section)  Acute Rehab PT Goals Patient Stated Goal: home after rehab PT Goal Formulation: With patient Time For Goal Achievement: 06/20/23 Potential to Achieve Goals: Good    Frequency Min 1X/week     Co-evaluation               AM-PAC PT "6 Clicks" Mobility  Outcome Measure Help needed turning from your back to your side while in a flat bed without using bedrails?: A Little Help needed moving from lying on your back to sitting on the side of a flat bed without using bedrails?: A Little Help needed moving to and from a bed to a chair (including a wheelchair)?: A Little Help needed standing up from a chair using your arms (e.g., wheelchair or bedside chair)?: A Little Help needed to walk in hospital room?: A Lot Help needed climbing 3-5 steps with a railing? : Total 6 Click Score: 15    End of Session Equipment Utilized During Treatment: Cervical collar Activity Tolerance: Patient tolerated treatment well Patient left: in chair;with call bell/phone within reach;with chair alarm set Nurse Communication: Mobility status PT Visit Diagnosis: Other abnormalities of gait and mobility (R26.89);Muscle weakness (generalized) (M62.81)    Time: 1610-9604 PT Time Calculation (min) (ACUTE  ONLY): 24 min   Charges:   PT Evaluation $PT Eval Low Complexity: 1 Low PT Treatments $Therapeutic Activity: 8-22 mins PT General Charges $$ ACUTE PT VISIT: 1 Visit         Marye Round, PT DPT Acute Rehabilitation Services Secure Chat Preferred  Office [phone]   Nanda Bittick Sheliah Plane 06/06/2023, 10:57 AM

## 2023-06-06 NOTE — Consult Note (Addendum)
Physical Medicine and Rehabilitation Consult Reason for Consult: Cervical myelopathy Referring Physician: Dawley   HPI: Leon Rodriguez is a 57 y.o. male with a history of worsening bilateral lower extremity weakness and sensory loss progressing over several months.  He had progressed to the point where patient was requiring a wheelchair for mobility.  Patient developed spasticity as well.  MRI of the cervical spine revealed a C6-T1 severe cervical stenosis with cord compression and myelopathy.  Patient presented on 06/05/2023 for C6-T1 ACDF by Dr. Jake Samples.  Patient with significant lower extremity spasms this morning.  Valium was added and baclofen was increased.  He remains in a cervical collar.  Therapy is pending this morning.  C6-T1 cervical myelopathy   Review of Systems  Constitutional:  Negative for fever.  HENT: Negative.    Eyes: Negative.   Respiratory: Negative.    Cardiovascular: Negative.   Gastrointestinal:  Positive for constipation.  Genitourinary:  Positive for frequency and urgency.  Musculoskeletal:  Positive for back pain and myalgias.  Skin:  Negative for rash.  Neurological:  Positive for sensory change and focal weakness. Dizziness: spasticity of LE's. Psychiatric/Behavioral:  Negative for substance abuse.    Past Medical History:  Diagnosis Date   Arthritis    Hip pain    Past Surgical History:  Procedure Laterality Date   APPENDECTOMY     CIRCUMCISION     HIATAL HERNIA REPAIR     HIP SURGERY     at 68 and 57 yo   History reviewed. No pertinent family history. Social History:  reports that he has been smoking cigarettes and cigars. He has never used smokeless tobacco. He reports that he does not currently use alcohol. He reports current drug use. Drug: Marijuana. Allergies: No Known Allergies Medications Prior to Admission  Medication Sig Dispense Refill   Baclofen 5 MG TABS Take 1 tablet by mouth 3 (three) times daily as needed (muscle  spasms).     famotidine (ZANTAC 360) 10 MG tablet Take 10 mg by mouth daily as needed for heartburn or indigestion.     acetaminophen (TYLENOL) 500 MG tablet Take 2 tablets (1,000 mg total) by mouth every 8 (eight) hours as needed for fever. (Patient not taking: Reported on 06/03/2023) 30 tablet 0    Home: Home Living Family/patient expects to be discharged to:: Private residence Living Arrangements: Spouse/significant other Available Help at Discharge: Family Type of Home: House Home Access: Stairs to enter Secretary/administrator of Steps: 2 Entrance Stairs-Rails: None Home Layout: Two level, Bed/bath upstairs Bathroom Shower/Tub: Tub/shower unit Home Equipment: Information systems manager, Higher education careers adviser held shower head, Agricultural consultant (2 wheels), [employer] - single point, Wheelchair - manual, Crutches  Functional History: Prior Function Prior Level of Function : Needs assist Mobility Comments: pt states he stays on the second floor and only goes downstairs for appts, had a fall on stairs a couple of months ago. pt has been walking short distances with RW. pt reports 4 falls in the past 6 months ADLs Comments: wife does dressing and LB washup, has been getting in and out of tub but has had a fall there Functional Status:  Mobility:          ADL:    Cognition: Cognition Orientation Level: Oriented X4    Blood pressure 120/76, pulse (!) 111, temperature 98 F (36.7 C), temperature source Oral, resp. rate 16, height 5\' 9"  (1.753 m), weight 93 kg, SpO2 94%. Physical Exam Constitutional:  General: He is not in acute distress.    Appearance: He is normal weight.  HENT:     Head: Normocephalic.     Right Ear: External ear normal.     Left Ear: External ear normal.     Nose: Nose normal.     Mouth/Throat:     Mouth: Mucous membranes are moist.  Eyes:     Pupils: Pupils are equal, round, and reactive to light.  Neck:     Comments: C-collar Cardiovascular:     Rate and Rhythm: Tachycardia  present.  Pulmonary:     Effort: Pulmonary effort is normal.  Abdominal:     Palpations: Abdomen is soft.  Neurological:     Mental Status: He is alert.     Comments: Alert and oriented x 3. Normal insight and awareness. Intact Memory. Normal language and speech. Cranial nerve exam unremarkable. MMT: RUE 5/5. LUE 5/5 deltoid, biceps, 4+ triceps, wrist extension, HI. Pt moves both LE's but significantly inhibited by spasticity and pain. MAS of LE's 3-4/5 with pt going into full extensor spasms when I attempted to range his knees and feet.   Has sl tingling of finger tips bilaterally and 1/2 sensation below inguinal area bilaterally.  Psychiatric:        Mood and Affect: Mood normal.        Behavior: Behavior normal.     No results found for this or any previous visit (from the past 24 hours). DG Cervical Spine 1 View Result Date: 06/05/2023 CLINICAL DATA:  Fusion EXAM: Intraoperative fluoroscopy COMPARISON:  None Available. FINDINGS: Four fluoroscopic spot images submitted for review demonstrate placement of anterior fixation plates and prosthetic disc material along the lower cervical spine. Imaging was obtained to aid in treatment. Please correlate with real-time fluoroscopy of 45.1 seconds. Cumulative dose 13.4 mGy IMPRESSION: Intraoperative fluoroscopy Electronically Signed   By: Karen Kays M.D.   On: 06/05/2023 18:44   DG C-Arm 1-60 Min-No Report Result Date: 06/05/2023 Fluoroscopy was utilized by the requesting physician.  No radiographic interpretation.   DG C-Arm 1-60 Min-No Report Result Date: 06/05/2023 Fluoroscopy was utilized by the requesting physician.  No radiographic interpretation.   DG C-Arm 1-60 Min-No Report Result Date: 06/05/2023 Fluoroscopy was utilized by the requesting physician.  No radiographic interpretation.    Assessment/Plan: Diagnosis: 57 year old male with C6-T1 cervical myelopathy status post ACDF on 06/05/2023 Does the need for close, 24 hr/day  medical supervision in concert with the patient's rehab needs make it unreasonable for this patient to be served in a less intensive setting? Yes Co-Morbidities requiring supervision/potential complications:  -Severe lower extremity spasticity -Postoperative pain -Neurogenic bowel -Neurogenic bladder Due to bladder management, bowel management, safety, skin/wound care, disease management, medication administration, pain management, and patient education, does the patient require 24 hr/day rehab nursing? Yes Does the patient require coordinated care of a physician, rehab nurse, therapy disciplines of PT, OT to address physical and functional deficits in the context of the above medical diagnosis(es)? Yes Addressing deficits in the following areas: balance, endurance, locomotion, strength, transferring, bowel/bladder control, bathing, dressing, feeding, grooming, toileting, and psychosocial support Can the patient actively participate in an intensive therapy program of at least 3 hrs of therapy per day at least 5 days per week? Yes The potential for patient to make measurable gains while on inpatient rehab is excellent Anticipated functional outcomes upon discharge from inpatient rehab are modified independent and supervision  with PT, modified independent and supervision with OT, n/a with  SLP., +/- at w/c level Estimated rehab length of stay to reach the above functional goals is: 18-24 days Anticipated discharge destination: Home Overall Rehab/Functional Prognosis: excellent  POST ACUTE RECOMMENDATIONS: This patient's condition is appropriate for continued rehabilitative care in the following setting: CIR Patient has agreed to participate in recommended program. Yes Note that insurance prior authorization may be required for reimbursement for recommended care.  Comment: Pt has been experiencing motor and sensory loss over the last year. Symptoms worsened to the point where he could no longer  work or drive this past June. I told him that we should be able to help his spasticity and lower extremity strength and ROM, but given the chronicity of his myelopathy recovery will be a prolonged course. One blessing is that he has minimal involvement of his upper extremities.   MEDICAL RECOMMENDATIONS: He needs scheduled spasticity medication. I will start by changing baclofen to 10mg  q8 hours. I would titrate it up aggressively. Can continue valium prn Maintain mobility and ROM efforts with PT, OT Pain control as you are doing.  Recommend LE venous dopplers to rule out DVT given his chronic LE weakness. Also recommend sq lovenox 30mg  q12 for DVT prophylaxis Begin scheduled stool softener as he has early neurogenic bladder Timed voids for neurogenic bladder, scan/cath prn for vol >350 cc   I have personally performed a face to face diagnostic evaluation of this patient. Additionally, I have examined the patient's medical record including any pertinent labs and radiographic images. If the physician assistant has documented in this note, I have reviewed and edited or otherwise concur with the physician assistant's documentation.  Thanks,  Ranelle Oyster, MD 06/06/2023

## 2023-06-06 NOTE — Anesthesia Postprocedure Evaluation (Signed)
Anesthesia Post Note  Patient: WRAITH RUNSER  Procedure(s) Performed: ANTERIOR CERVICAL DISCECTOMY AND FUSION CERVICAL SIX-SEVEN/CERVICAL SEVEN-THORACIC ONE     Patient location during evaluation: PACU Anesthesia Type: General Level of consciousness: awake and alert Pain management: pain level controlled Vital Signs Assessment: post-procedure vital signs reviewed and stable Respiratory status: spontaneous breathing, nonlabored ventilation, respiratory function stable and patient connected to nasal cannula oxygen Cardiovascular status: blood pressure returned to baseline and stable Postop Assessment: no apparent nausea or vomiting Anesthetic complications: no   No notable events documented.  Last Vitals:  Vitals:   06/06/23 0400 06/06/23 0747  BP:  120/76  Pulse: 95 (!) 111  Resp:  16  Temp:  36.7 C  SpO2: 99% 94%    Last Pain:  Vitals:   06/06/23 0747  TempSrc: Oral  PainSc:                  Kennieth Rad

## 2023-06-07 ENCOUNTER — Inpatient Hospital Stay (HOSPITAL_COMMUNITY): Payer: Medicaid Other

## 2023-06-07 ENCOUNTER — Inpatient Hospital Stay (HOSPITAL_COMMUNITY)
Admission: AD | Admit: 2023-06-07 | Discharge: 2023-06-13 | DRG: 559 | Disposition: A | Discharge status: Other Acute Inpatient Hospital | Payer: Medicaid Other | Source: Intra-hospital | Attending: Physical Medicine and Rehabilitation | Admitting: Physical Medicine and Rehabilitation

## 2023-06-07 ENCOUNTER — Encounter (HOSPITAL_COMMUNITY): Payer: Self-pay | Admitting: Neurological Surgery

## 2023-06-07 DIAGNOSIS — K5900 Constipation, unspecified: Secondary | ICD-10-CM | POA: Diagnosis present

## 2023-06-07 DIAGNOSIS — Z981 Arthrodesis status: Secondary | ICD-10-CM

## 2023-06-07 DIAGNOSIS — K592 Neurogenic bowel, not elsewhere classified: Secondary | ICD-10-CM

## 2023-06-07 DIAGNOSIS — R350 Frequency of micturition: Secondary | ICD-10-CM | POA: Diagnosis present

## 2023-06-07 DIAGNOSIS — R339 Retention of urine, unspecified: Secondary | ICD-10-CM | POA: Diagnosis not present

## 2023-06-07 DIAGNOSIS — M79661 Pain in right lower leg: Secondary | ICD-10-CM

## 2023-06-07 DIAGNOSIS — N319 Neuromuscular dysfunction of bladder, unspecified: Secondary | ICD-10-CM | POA: Diagnosis not present

## 2023-06-07 DIAGNOSIS — R258 Other abnormal involuntary movements: Secondary | ICD-10-CM | POA: Diagnosis present

## 2023-06-07 DIAGNOSIS — I959 Hypotension, unspecified: Secondary | ICD-10-CM | POA: Diagnosis not present

## 2023-06-07 DIAGNOSIS — G959 Disease of spinal cord, unspecified: Principal | ICD-10-CM | POA: Diagnosis present

## 2023-06-07 DIAGNOSIS — F17210 Nicotine dependence, cigarettes, uncomplicated: Secondary | ICD-10-CM | POA: Diagnosis present

## 2023-06-07 DIAGNOSIS — M4712 Other spondylosis with myelopathy, cervical region: Secondary | ICD-10-CM | POA: Diagnosis present

## 2023-06-07 DIAGNOSIS — R7881 Bacteremia: Secondary | ICD-10-CM | POA: Diagnosis not present

## 2023-06-07 DIAGNOSIS — Z4789 Encounter for other orthopedic aftercare: Secondary | ICD-10-CM | POA: Diagnosis present

## 2023-06-07 DIAGNOSIS — J189 Pneumonia, unspecified organism: Secondary | ICD-10-CM | POA: Diagnosis not present

## 2023-06-07 DIAGNOSIS — Z79899 Other long term (current) drug therapy: Secondary | ICD-10-CM

## 2023-06-07 DIAGNOSIS — R310 Gross hematuria: Secondary | ICD-10-CM | POA: Diagnosis not present

## 2023-06-07 DIAGNOSIS — R252 Cramp and spasm: Secondary | ICD-10-CM

## 2023-06-07 DIAGNOSIS — M62831 Muscle spasm of calf: Secondary | ICD-10-CM | POA: Diagnosis present

## 2023-06-07 DIAGNOSIS — N3000 Acute cystitis without hematuria: Secondary | ICD-10-CM | POA: Diagnosis not present

## 2023-06-07 MED ORDER — ENOXAPARIN SODIUM 30 MG/0.3ML IJ SOSY
30.0000 mg | PREFILLED_SYRINGE | Freq: Two times a day (BID) | INTRAMUSCULAR | Status: DC
  Administered 2023-06-07 – 2023-06-11 (×7): 30 mg via SUBCUTANEOUS
  Filled 2023-06-07 (×8): qty 0.3

## 2023-06-07 MED ORDER — BACLOFEN 10 MG PO TABS
20.0000 mg | ORAL_TABLET | Freq: Three times a day (TID) | ORAL | Status: DC
Start: 2023-06-07 — End: 2023-06-11
  Administered 2023-06-07 – 2023-06-11 (×11): 20 mg via ORAL
  Filled 2023-06-07 (×12): qty 2

## 2023-06-07 MED ORDER — ONDANSETRON HCL 4 MG/2ML IJ SOLN: 4.0000 mg | Freq: Four times a day (QID) | INTRAMUSCULAR | Status: DC | PRN

## 2023-06-07 MED ORDER — ACETAMINOPHEN 325 MG PO TABS: 325.0000 mg | ORAL_TABLET | ORAL | Status: DC | PRN

## 2023-06-07 MED ORDER — FLEET ENEMA RE ENEM: 1.0000 | ENEMA | Freq: Once | RECTAL | Status: DC | PRN

## 2023-06-07 MED ORDER — HEPARIN SODIUM (PORCINE) 5000 UNIT/ML IJ SOLN: 5000.0000 [IU] | Freq: Three times a day (TID) | INTRAMUSCULAR | Status: DC

## 2023-06-07 MED ORDER — BACLOFEN 10 MG PO TABS: 20.0000 mg | ORAL_TABLET | Freq: Three times a day (TID) | ORAL | Status: DC

## 2023-06-07 MED ORDER — SENNOSIDES-DOCUSATE SODIUM 8.6-50 MG PO TABS
2.0000 | ORAL_TABLET | Freq: Every day | ORAL | Status: DC
  Administered 2023-06-07 – 2023-06-08 (×2): 2 via ORAL
  Filled 2023-06-07 (×2): qty 2

## 2023-06-07 MED ORDER — GUAIFENESIN-DM 100-10 MG/5ML PO SYRP
10.0000 mL | ORAL_SOLUTION | Freq: Four times a day (QID) | ORAL | Status: DC | PRN
Start: 2023-06-07 — End: 2023-06-13

## 2023-06-07 MED ORDER — HEPARIN SODIUM (PORCINE) 5000 UNIT/ML IJ SOLN
5000.0000 [IU] | Freq: Three times a day (TID) | INTRAMUSCULAR | Status: DC
  Administered 2023-06-07: 5000 [IU] via SUBCUTANEOUS
  Filled 2023-06-07: qty 1

## 2023-06-07 MED ORDER — BACLOFEN 10 MG PO TABS
20.0000 mg | ORAL_TABLET | Freq: Three times a day (TID) | ORAL | Status: DC
  Administered 2023-06-07: 20 mg via ORAL
  Filled 2023-06-07: qty 2

## 2023-06-07 MED ORDER — HYDROCODONE-ACETAMINOPHEN 5-325 MG PO TABS
1.0000 | ORAL_TABLET | ORAL | Status: DC | PRN
  Administered 2023-06-09 – 2023-06-12 (×5): 1 via ORAL
  Filled 2023-06-07 (×6): qty 1

## 2023-06-07 MED ORDER — ALUM & MAG HYDROXIDE-SIMETH 200-200-20 MG/5ML PO SUSP: 30.0000 mL | ORAL | Status: DC | PRN

## 2023-06-07 MED ORDER — HYDROCODONE-ACETAMINOPHEN 5-325 MG PO TABS
2.0000 | ORAL_TABLET | ORAL | Status: DC | PRN
  Administered 2023-06-07 – 2023-06-10 (×8): 2 via ORAL
  Filled 2023-06-07 (×10): qty 2

## 2023-06-07 MED ORDER — ONDANSETRON HCL 4 MG PO TABS: 4.0000 mg | ORAL_TABLET | Freq: Four times a day (QID) | ORAL | Status: DC | PRN

## 2023-06-07 MED ORDER — POLYETHYLENE GLYCOL 3350 17 G PO PACK: 17.0000 g | PACK | Freq: Every day | ORAL | Status: DC | PRN

## 2023-06-07 MED ORDER — DIAZEPAM 5 MG PO TABS
5.0000 mg | ORAL_TABLET | Freq: Four times a day (QID) | ORAL | Status: DC | PRN
  Administered 2023-06-07: 5 mg via ORAL
  Filled 2023-06-07: qty 1

## 2023-06-07 NOTE — Progress Notes (Signed)
Patient discharged to CIR 4M02. PIV kept in place for IV med use. All belongings transported with patient including his personal wheelchair, cell phone, aspen collar, and clothing. Wife notified of transfer to CIR. Sister at bedside. Oliver Barre, RN

## 2023-06-07 NOTE — TOC Transition Note (Signed)
Transition of Care (TOC) - Discharge Note Donn Pierini RN,BSN Transitions of Care Unit 4NP (Non Trauma)- RN Case Manager See Treatment Team for direct Phone #   Patient Details  Name: Leon Rodriguez MRN: [id number] Date of Birth: May 03, 1966  Transition of Care [facility]) CM/SW Contact:  Darrold Span, RN Phone Number: 06/07/2023, 12:29 PM   Clinical Narrative:    Cm notified by CIR liaison that pt has bed for admit to [facility] INPT rehab today- also has auth for admit.   Pt stable for transition to INPT rehab- MD has placed discharge order and pt will transition this afternoon to [facility] INPT rehab.  No further TOC needs noted.    Final next level of care: IP Rehab Facility Barriers to Discharge: Barriers Resolved   Patient Goals and CMS Choice Patient states their goals for this hospitalization and ongoing recovery are:: rehab CMS Medicare.gov Compare Post Acute Care list provided to:: Patient Choice offered to / list presented to : Patient      Discharge Placement             [facility] rehab          Discharge Plan and Services Additional resources added to the After Visit Summary for       Post Acute Care Choice: IP Rehab                               Social Drivers of Health (SDOH) Interventions SDOH Screenings   Food Insecurity: Patient Declined (06/05/2023)  Housing: Patient Declined (06/05/2023)  Transportation Needs: Patient Declined (06/05/2023)  Utilities: Patient Declined (06/05/2023)  Depression (PHQ2-9): High Risk (04/05/2023)  Tobacco Use: High Risk (06/05/2023)     Readmission Risk Interventions    06/07/2023   12:29 PM  Readmission Risk Prevention Plan  Medication Screening Complete  Transportation Screening Complete

## 2023-06-07 NOTE — Progress Notes (Signed)
Physical Therapy Treatment Patient Details Name: Leon Rodriguez MRN: [id number] DOB: [birth year] Today's Date: 06/07/2023   History of Present Illness 57 yo male s/p C6-T1 ACDF on 12/11 for severe cervical stenosis with cord compression consistent with myelopathy. PMH includes OA, hiatal hernia repair.    PT Comments  Pt reporting continued LE spasms and back pain today. Pt eager to progress mobility, ambulatory for x2 short room distances with RW and min steadying assist. Pt holding Les in rigid extension during gait and when transferring sit<>stand, suspect due to pt pain and spasms. Pt hopeful to d/c to [facility] soon, significant other present and supportive.     If plan is discharge home, recommend the following: A lot of help with bathing/dressing/bathroom;A little help with walking and/or transfers   Can travel by private vehicle        Equipment Recommendations  None recommended by PT    Recommendations for Other Services       Precautions / Restrictions Precautions Precautions: Fall;Cervical Precaution Booklet Issued: Yes (comment) Required Braces or Orthoses: Cervical Brace Cervical Brace: Hard collar (can be removed for showering, toilet transfers, and in bed.  Can be donned and doffed EOB.) Restrictions Weight Bearing Restrictions Per Provider Order: No     Mobility  Bed Mobility Overal bed mobility: Needs Assistance Bed Mobility: Supine to Sit     Supine to sit: Supervision     General bed mobility comments: supervision for safety, HOB very elevated and pt progressed LEs over EOB then elevated trunk while maintaining back precautions.    Transfers Overall transfer level: Needs assistance Equipment used: Rolling walker (2 wheels) Transfers: Sit to/from Stand Sit to Stand: Min assist           General transfer comment: assist for rise and steady, especially with transitioning UEs to/from RW. Stand x2, from EOB and recliner. Pt walking LEs toward self in LE  extension position given spasms.    Ambulation/Gait Ambulation/Gait assistance: Min assist Gait Distance (Feet): 15 Feet (x2) Assistive device: Rolling walker (2 wheels) Gait Pattern/deviations: Step-through pattern, Decreased stride length, Trunk flexed Gait velocity: decr     General Gait Details: assist to steady and manage RW, cues for upright posture and placement in RW. Pt taking short steps and heavily reliant on UEs   Stairs             Wheelchair Mobility     Tilt Bed    Modified Rankin (Stroke Patients Only)       Balance Overall balance assessment: Needs assistance, History of Falls Sitting-balance support: No upper extremity supported, Feet supported Sitting balance-Leahy Scale: Fair     Standing balance support: Bilateral upper extremity supported, During functional activity Standing balance-Leahy Scale: Poor Standing balance comment: Pt needs BUE support for balance in standing                            Cognition Arousal: Alert Behavior During Therapy: WFL for tasks assessed/performed Overall Cognitive Status: Within Functional Limits for tasks assessed                                          Exercises      General Comments        Pertinent Vitals/Pain Pain Assessment Pain Assessment: Faces Faces Pain Scale: Hurts even more Pain Location: LE Pain  Descriptors / Indicators: Spasm Pain Intervention(s): Limited activity within patient's tolerance, Monitored during session, Repositioned    Home Living                          Prior Function            PT Goals (current goals can now be found in the care plan section) Acute Rehab PT Goals Patient Stated Goal: home after rehab PT Goal Formulation: With patient Time For Goal Achievement: 06/20/23 Potential to Achieve Goals: Good Progress towards PT goals: Progressing toward goals    Frequency    Min 1X/week      PT Plan       Co-evaluation              AM-PAC PT "6 Clicks" Mobility   Outcome Measure  Help needed turning from your back to your side while in a flat bed without using bedrails?: A Little Help needed moving from lying on your back to sitting on the side of a flat bed without using bedrails?: A Little Help needed moving to and from a bed to a chair (including a wheelchair)?: A Little Help needed standing up from a chair using your arms (e.g., wheelchair or bedside chair)?: A Little Help needed to walk in hospital room?: A Lot Help needed climbing 3-5 steps with a railing? : Total 6 Click Score: 15    End of Session Equipment Utilized During Treatment: Cervical collar;Gait belt Activity Tolerance: Patient tolerated treatment well Patient left: in bed;with call bell/phone within reach;Other (comment);with family/visitor present (MD from [facility] at bedside, pt's wife to assist pt's LEs back into bed upon MD exit) Nurse Communication: Mobility status PT Visit Diagnosis: Other abnormalities of gait and mobility (R26.89);Muscle weakness (generalized) (M62.81)     Time: 1610-9604 PT Time Calculation (min) (ACUTE ONLY): 21 min  Charges:    $Therapeutic Activity: 8-22 mins PT General Charges $$ ACUTE PT VISIT: 1 Visit                     Marye Round, PT DPT Acute Rehabilitation Services Secure Chat Preferred  Office [phone]    Falon Flinchum Sheliah Plane 06/07/2023, 3:42 PM

## 2023-06-07 NOTE — Progress Notes (Addendum)
    Providing Compassionate, Quality Care - Together   NEUROSURGERY PROGRESS NOTE     S: NAEs o/n.    O: EXAM:  BP (!) 135/93 (BP Location: Right Arm)   Pulse (!) 124   Temp 98.7 F (37.1 C) (Oral)   Resp 18   Ht 5\' 9"  (1.753 m)   Wt 93 kg   SpO2 98%   BMI 30.27 kg/m     Awake, alert, oriented  Speech fluent, appropriate  BUE 5/5. BLE 3/5 DF/PF Dressing c/d/I Significant spasticity and stiffening of LEs   ASSESSMENT:  57 y.o. with  -C6-T1 CSM s/p ACDF C6-T1, POD#2  PLAN: -Spasticity control. Baclofen increased.  -Continue to mobilize as tolerated -CIR pending availability.  -Call w/ questions/concerns.   Leon Rodriguez, [facility]

## 2023-06-07 NOTE — Progress Notes (Signed)
Inpatient Rehabilitation Admission Medication Review by a Pharmacist  A complete drug regimen review was completed for this patient to identify any potential clinically significant medication issues.  High Risk Drug Classes Is patient taking? Indication by Medication  Antipsychotic No   Anticoagulant Yes Heparin for DVT ppx  Antibiotic No   Opioid Yes Hydrocodone for acute pain   Antiplatelet No   Hypoglycemics/insulin No   Vasoactive Medication No   Chemotherapy No   Other Yes Acetaminophen for mild pain Baclofen and diazepam for muscle spasms Senna-docusate, miralax, Fleet for constipation Maalox for indigestion Robitussin for cough Ondansetron for nausea     Type of Medication Issue Identified Description of Issue Recommendation(s)  Drug Interaction(s) (clinically significant)     Duplicate Therapy     Allergy     No Medication Administration End Date     Incorrect Dose     Additional Drug Therapy Needed     Significant med changes from prior encounter (inform family/care partners about these prior to discharge). HELD home famotidine prn Restart if needed   Other       Clinically significant medication issues were identified that warrant physician communication and completion of prescribed/recommended actions by midnight of the next day:  No  Name of provider notified for urgent issues identified:   Provider Method of Notification:     Pharmacist comments:   Time spent performing this drug regimen review (minutes):  15  Alphia Moh, PharmD, [place], [facility] Clinical Pharmacist  Please check AMION for all [facility] phone numbers After 10:00 PM, call Main Pharmacy [phone]

## 2023-06-07 NOTE — H&P (Addendum)
Physical Medicine and Rehabilitation Admission H&P     CC: Functional deficits secondary to cervical myelopathy   HPI: Leon Rodriguez is a 57 year old male with a history of worsening bilateral lower extremity numbness tingling and difficulty walking he has had balance difficulties progressively worsening for many months and at time of presentation was utilizing a wheelchair.  He also complains of spasticity in his lower extremities and some intermittent numbness and tingling in his hands.  Evaluated by Dr. Jake Samples and MRI of the cervical spine revealed C6-T1 severe cervical stenosis with cord compression consistent with myelopathy.  He underwent arthrodesis of C5-6-7, C7-T1 anterior interbody technique by Dr. Jake Samples on 12/11.  Currently receiving baclofen and diazepam for lower extremity spasms.  No mention of neurogenic bowel or bladder.  He remains in Aspen collar.  He is tolerating his diet.  Labs are unremarkable.  Past surgical history is significant for hiatal hernia repair, hip surgery as a teenager.  No other significant medical history.The patient requires inpatient medicine and rehabilitation evaluations and services for ongoing dysfunction secondary to cervical myelopathy.   Review of Systems  Constitutional:  Negative for fever.  HENT:  Negative for hearing loss.   Eyes: Negative.   Cardiovascular: Negative.   Gastrointestinal: Negative.   Genitourinary:  Positive for frequency and urgency.  Musculoskeletal:  Positive for back pain, falls and myalgias.  Skin:  Negative for rash.  Neurological:  Positive for sensory change and focal weakness.  Psychiatric/Behavioral:  The patient is not nervous/anxious.         Past Medical History:  Diagnosis Date   Arthritis     Hip pain               Past Surgical History:  Procedure Laterality Date   ANTERIOR CERVICAL DECOMP/DISCECTOMY FUSION N/A 06/05/2023    Procedure: ANTERIOR CERVICAL DISCECTOMY AND FUSION CERVICAL SIX-SEVEN/CERVICAL  SEVEN-THORACIC ONE;  Surgeon: Bethann Goo, DO;  Location: [facility] OR;  Service: Neurosurgery;  Laterality: N/A;  3C   APPENDECTOMY       CIRCUMCISION       HIATAL HERNIA REPAIR       HIP SURGERY        at 54 and 57 yo        History reviewed. No pertinent family history.     Social History:  reports that he has been smoking cigarettes and cigars. He has never used smokeless tobacco. He reports that he does not currently use alcohol. He reports current drug use. Drug: Marijuana. Allergies:  Allergies  No Known Allergies         Medications Prior to Admission  Medication Sig Dispense Refill   Baclofen 5 MG TABS Take 1 tablet by mouth 3 (three) times daily as needed (muscle spasms).       famotidine (ZANTAC 360) 10 MG tablet Take 10 mg by mouth daily as needed for heartburn or indigestion.       acetaminophen (TYLENOL) 500 MG tablet Take 2 tablets (1,000 mg total) by mouth every 8 (eight) hours as needed for fever. (Patient not taking: Reported on 06/03/2023) 30 tablet 0              Home: Home Living Family/patient expects to be discharged to:: Private residence Living Arrangements: Spouse/significant other Available Help at Discharge: Family, Available 24 hours/day (iniitally has assist from significant other 24 hour but she plans to go back to work in January) Type of Home: House Home Access: Stairs to enter [employer]  of Steps: 2 Entrance Stairs-Rails: None Home Layout: Two level, Bed/bath upstairs, 1/2 bath on main level Bathroom Shower/Tub: Engineer, manufacturing systems: Standard Home Equipment: Information systems manager, Hand held shower head, Agricultural consultant (2 wheels), [employer] - single point, Wheelchair - manual, Crutches, Toilet riser   Functional History: Prior Function Prior Level of Function : Needs assist Mobility Comments: pt states he stays on the second floor and only goes downstairs for appts, had a fall on stairs a couple of months ago. pt has been walking short  distances with RW, and states he has mostly been hopping and using RW like crutches to maintain bilat LE NWB during swing phase of gait. pt reports 4 falls in the past 6 months ADLs Comments: wife does dressing and LB washup, has been getting in and out of tub but has had a fall there   Functional Status:  Mobility: Bed Mobility Overal bed mobility: Needs Assistance Bed Mobility: Rolling, Sidelying to Sit Rolling: Min assist, Used rails Sidelying to sit: Min assist, HOB elevated General bed mobility comments: assist for trunk and LE management, cues for sequencing task Transfers Overall transfer level: Needs assistance Equipment used: Rolling walker (2 wheels) Transfers: Sit to/from Stand, Bed to chair/wheelchair/BSC Sit to Stand: Min assist Bed to/from chair/wheelchair/BSC transfer type:: Step pivot Step pivot transfers: Min assist General transfer comment: Pt able to use UEs primarily for sit to stand transtion up to standing with RW for support.  He would then walk his LEs back toward the chair to achieve upright balance posture with knees extended throughout the process.  Flexed trunk noted in standing. Ambulation/Gait General Gait Details: nt, LE spasms and pain   ADL: ADL Overall ADL's : Needs assistance/impaired Eating/Feeding: Independent, Sitting Grooming: Wash/dry hands, Wash/dry face, Sitting, Set up Upper Body Bathing: Supervision/ safety, Sitting Lower Body Bathing: Moderate assistance, Sit to/from stand Upper Body Dressing : Supervision/safety, Sitting Lower Body Dressing: Maximal assistance, Sit to/from stand Toilet Transfer: Minimal assistance, Ambulation, Rolling walker (2 wheels), BSC/3in1 Toilet Transfer Details (indicate cue type and reason): simulated Toileting- Clothing Manipulation and Hygiene: Sit to/from stand, Moderate assistance Toileting - Clothing Manipulation Details (indicate cue type and reason): simulated Functional mobility during ADLs: Minimal  assistance, Rolling walker (2 wheels) General ADL Comments: Pt with decreased ability to cross each LE over the opposite knee for dressing tasks.  Increased spasms noted with attempts to cross them over.  Sit to stand with increased stiffness in hips and knees and pt relying on UEs to push him up from the chair and then working his LEs back to allow for standing balance.  Provided handout for education on cervical precautions for selfcare and bed mobility.   Cognition: Cognition Overall Cognitive Status: Within Functional Limits for tasks assessed Orientation Level: Oriented X4 Cognition Arousal: Alert Behavior During Therapy: WFL for tasks assessed/performed Overall Cognitive Status: Within Functional Limits for tasks assessed   Physical Exam: Blood pressure (!) 139/93, pulse 98, temperature 98.4 F (36.9 C), temperature source Oral, resp. rate 19, height 5\' 9"  (1.753 m), weight 93 kg, SpO2 98%. Physical Exam Constitutional:      General: He is not in acute distress.    Appearance: Normal appearance. He is not ill-appearing.  HENT:     Head: Normocephalic and atraumatic.     Right Ear: External ear normal.     Left Ear: External ear normal.     Nose: Nose normal.     Mouth/Throat:     Pharynx: Oropharynx is clear.  No oropharyngeal exudate or posterior oropharyngeal erythema.  Eyes:     Extraocular Movements: Extraocular movements intact.     Conjunctiva/sclera: Conjunctivae normal.     Pupils: Pupils are equal, round, and reactive to light.  Neck:     Comments: Wearing aspen collar. Neck incision dressed and intact, minimal drainage Cardiovascular:     Rate and Rhythm: Normal rate and regular rhythm.     Heart sounds: No murmur heard.    No gallop.  Pulmonary:     Effort: Pulmonary effort is normal. No respiratory distress.     Breath sounds: No wheezing.  Abdominal:     General: Bowel sounds are normal. There is no distension.     Palpations: Abdomen is soft.  Skin:     General: Skin is warm and dry.  Neurological:     Mental Status: He is alert.     Comments: Alert and oriented x 3. Normal insight and awareness. Intact Memory. Normal language and speech. Cranial nerve exam unremarkable. MMT: RUE 5/5. LUE 5/5 deltoid, biceps, 4+ triceps, wrist extensors, hand intrinsics. Pt appears to be able to move all muscle both lower extremities but MMT inhibited by spasticity. Pt with decreased LT in finger tips and from inguinal region down. Indicated that LT was intact along trunk during sensory testing. Pt with hyperreflexia in LE's 3+. MAS is 3 to 4/4 in BLE's, particularly in quads, plantarflexors, and hip adductors. Tone was slightly improved from yesterday when I examined him.   Psychiatric:        Mood and Affect: Mood normal.        Behavior: Behavior normal.        Lab Results Last 48 Hours  No results found for this or any previous visit (from the past 48 hours).    Imaging Results (Last 48 hours)  DG Cervical Spine 1 View Result Date: 06/05/2023 CLINICAL DATA:  Fusion EXAM: Intraoperative fluoroscopy COMPARISON:  None Available. FINDINGS: Four fluoroscopic spot images submitted for review demonstrate placement of anterior fixation plates and prosthetic disc material along the lower cervical spine. Imaging was obtained to aid in treatment. Please correlate with real-time fluoroscopy of 45.1 seconds. Cumulative dose 13.4 mGy IMPRESSION: Intraoperative fluoroscopy Electronically Signed   By: Karen Kays M.D.   On: 06/05/2023 18:44    DG C-Arm 1-60 Min-No Report Result Date: 06/05/2023 Fluoroscopy was utilized by the requesting physician.  No radiographic interpretation.    DG C-Arm 1-60 Min-No Report Result Date: 06/05/2023 Fluoroscopy was utilized by the requesting physician.  No radiographic interpretation.    DG C-Arm 1-60 Min-No Report Result Date: 06/05/2023 Fluoroscopy was utilized by the requesting physician.  No radiographic interpretation.            Blood pressure (!) 139/93, pulse 98, temperature 98.4 F (36.9 C), temperature source Oral, resp. rate 19, height 5\' 9"  (1.753 m), weight 93 kg, SpO2 98%.   Medical Problem List and Plan: 1. Functional deficits secondary to chronic C6-T1 cervical myelopathy s/p ACDF 06/05/2023             -patient may shower with collar on. Will order replacement pads             -ELOS/Goals: 18-24 days, goals are mod I to supervision with self-care and mobility   2.  Antithrombotics: -DVT/anticoagulation:  Pharmaceutical: Heparin             -antiplatelet therapy: none   3. Pain Management: Tylenol as needed;   Norco  5-10 mg q 4 hours prn             - see spasticity discussion below 4. Mood/Behavior/Sleep: LCSW to evaluate and provide emotional support             -antipsychotic agents: n/a   5. Neuropsych/cognition: This patient is capable of making decisions on his own behalf.   6. Skin/Wound Care: Routine skin care checks   7. Fluids/Electrolytes/Nutrition: Routine Is and Os and follow-up chemistries   8. Significant LE spasticity:             -increased baclofen to 20mg  q8 hours today. Pt states that it's helping but that spasticity increases over the hour or so prior to the subsequent dose. Consider increasing to q6 hours over weekend BP/SE permitting             -valium 5mg  q6 prn             -rom, activity with PT 9. Neurogenic bowel:             -he is continent             -last bm yesterday             -senna-s 2 at bedtime, observe for pattern 10. Neurogenic bladder:             -pt with frequency, generally continent. He does have some incontinence at night.             -unsure of PVR's.             -begin timed voids while awake             -check PVR's     Milinda Antis, PA-C 06/07/2023  I have personally performed a face to face diagnostic evaluation of this patient and formulated the key components of the plan.  Additionally, I have personally reviewed  laboratory data, imaging studies, as well as relevant notes and concur with the physician assistant's documentation above.  The patient's status has not changed from the original H&P.  Any changes in documentation from the acute care chart have been noted above.  Ranelle Oyster, MD, [place]

## 2023-06-07 NOTE — Discharge Summary (Signed)
  Patient ID: Leon Rodriguez MRN: [id number] DOB/AGE: [birth year] 57 y.o.  Admit date: 06/05/2023 Discharge date: 06/07/2023  Admission Diagnoses: Cervical spinal stenosis [M48.02]   Discharge Diagnoses: Same   Discharged Condition: Stable  Hospital Course:  Leon Rodriguez is a 57 y.o. male who was admitted following an uncomplicated ACDF C6-T1. Preoperatively pt developed significant myelopathy and LE spasticity. They were recovered in PACU and transferred to 4NP. Physical medicine team was consulted who recommended titration of antispasmodics. Pt stable for discharge today to CIR. Pt to f/u in office for routine post op visit. Pt is in agreement w/ plan.    Discharge Exam: Blood pressure (!) 135/93, pulse 97, temperature 98.7 F (37.1 C), temperature source Oral, resp. rate 18, height 5\' 9"  (1.753 m), weight 93 kg, SpO2 98%. Awake, alert, oriented  Speech fluent, appropriate  BUE 5/5. BLE 3/5 DF/PF Dressing c/d/I Significant spasticity and stiffening of LEs  Disposition: Discharge disposition: [facility]-Another [facility] Care Institution Not Defined       Discharge Instructions     Incentive spirometry RT   Complete by: As directed       Allergies as of 06/07/2023   No Known Allergies      Medication List     ASK your doctor about these medications    Acetaminophen Extra Strength 500 MG Tabs Take 2 tablets (1,000 mg total) by mouth every 8 (eight) hours as needed for fever.   Baclofen 5 MG Tabs Take 1 tablet by mouth 3 (three) times daily as needed (muscle spasms).   Zantac 360 10 MG tablet Generic drug: famotidine Take 10 mg by mouth daily as needed for heartburn or indigestion.         Signed: Clovis Riley 06/07/2023, 10:22 AM

## 2023-06-07 NOTE — Progress Notes (Signed)
Signed     Expand All Collapse All PMR Admission Coordinator Pre-Admission Assessment   Patient: Leon Rodriguez is an 57 y.o., male MRN: [id number] DOB: [birth year] Height: 5\' 9"  (175.3 cm) Weight: 93 kg                                                                                                                                                  Insurance Information HMO:     PPO:      PCP:      IPA:      80/20:      OTHER: Group [id number] PRIMARY: BCBS [facility] Medicaid      Policy#: [id number]      Subscriber: self CM Name: Brett Canales      Phone#: [phone]     Fax#: [phone] Pre-Cert#: [id number] from Brett Canales with approval from 06/07/23 to 06/13/23 with update due on 06/14/23 to fax [phone]      Employer: Currently on LOA Benefits:  Phone #: [phone]     Name: Verified 06/06/23 Eff. Date: 03/26/23     Deduct: $0      Out of Pocket Max: $0      Life Max: n/a  CIR: 100%      SNF: 100% Outpatient:       Co-Pay: $4 copay/visit Home Health: 100%      Co-Pay: none DME: 100%     Co-Pay: none Providers: in network  SECONDARY:       Policy#:       Phone#:    Artist:       Phone#:    The Data processing manager" for patients in Inpatient Rehabilitation Facilities with attached "Privacy Act Statement-Health Care Records" was provided and verbally reviewed with: Patient   Emergency Contact Information Contact Information       Name Relation Home Work Mobile    [name],[name] Significant other     [phone]         Other Contacts   None on File      Current Medical History  Patient Admitting Diagnosis: C6-T1 cervical myelopathy   History of Present Illness:  A 57 y.o. male with a history of worsening bilateral lower extremity weakness and sensory loss progressing over several months.  He had progressed to the point where patient was requiring a wheelchair for mobility.  Patient developed spasticity as well.  MRI of the cervical spine  revealed a C6-T1 severe cervical stenosis with cord compression and myelopathy.  Patient presented on 06/05/2023 for C6-T1 ACDF by Dr. Jake Samples.  Patient with significant lower extremity spasms this morning.  Valium was added and baclofen was increased.  He remains in a cervical collar.  PT/OT evaluations completed with recommendations for acute inpatient rehab admission.     Patient's medical record from [facility] has been reviewed by  the rehabilitation admission coordinator and physician.   Past Medical History      Past Medical History:  Diagnosis Date   Arthritis     Hip pain            Has the patient had major surgery during 100 days prior to admission? Yes   Family History  family history is not on file.     Current Medications   Current Medications    Current Facility-Administered Medications:    acetaminophen (TYLENOL) tablet 650 mg, 650 mg, Oral, Q4H PRN, 650 mg at 06/07/23 0739 **OR** acetaminophen (TYLENOL) suppository 650 mg, 650 mg, Rectal, Q4H PRN, [name] [place], PA-C   baclofen (LIORESAL) tablet 20 mg, 20 mg, Oral, TID, [name] [place], PA-C   diazepam (VALIUM) tablet 5 mg, 5 mg, Oral, Q6H PRN, Dawley, Troy C, DO, 5 mg at 06/07/23 0739   heparin injection 5,000 Units, 5,000 Units, Subcutaneous, Q8H, Tomlinson, Sara Caylin, PA-C   HYDROcodone-acetaminophen (NORCO/VICODIN) 5-325 MG per tablet 1 tablet, 1 tablet, Oral, Q4H PRN, Clovis Riley, PA-C   HYDROcodone-acetaminophen (NORCO/VICODIN) 5-325 MG per tablet 2 tablet, 2 tablet, Oral, Q4H PRN, Clovis Riley, PA-C, 2 tablet at 06/07/23 1610   HYDROmorphone (DILAUDID) injection 0.5 mg, 0.5 mg, Intravenous, Q3H PRN, [name] [place], PA-C, 0.5 mg at 06/07/23 0417   menthol-cetylpyridinium (CEPACOL) lozenge 3 mg, 1 lozenge, Oral, PRN **OR** phenol (CHLORASEPTIC) mouth spray 1 spray, 1 spray, Mouth/Throat, PRN, [name] [place]lin, PA-C, 1 spray at 06/07/23 0911    ondansetron (ZOFRAN) tablet 4 mg, 4 mg, Oral, Q6H PRN **OR** ondansetron (ZOFRAN) injection 4 mg, 4 mg, Intravenous, Q6H PRN, [name] Caylin, PA-C   polyethylene glycol (MIRALAX / GLYCOLAX) packet 17 g, 17 g, Oral, Daily PRN, [name] [place], PA-C, 17 g at 06/07/23 9604   senna-docusate (Senokot-S) tablet 2 tablet, 2 tablet, Oral, QHS, Faith Rogue T, MD, 2 tablet at 06/06/23 2127   sodium chloride flush (NS) 0.9 % injection 3 mL, 3 mL, Intravenous, Q12H, [name] [place], PA-C, 3 mL at 06/07/23 0910   sodium chloride flush (NS) 0.9 % injection 3 mL, 3 mL, Intravenous, PRN, [name] [place], PA-C   sodium phosphate (FLEET) enema 1 enema, 1 enema, Rectal, Once PRN, Clovis Riley, PA-C     Patients Current Diet:  Diet Order                  Diet regular Room service appropriate? Yes; Fluid consistency: Thin  Diet effective now                         Precautions / Restrictions Precautions Precautions: Fall, Cervical Precaution Booklet Issued: Yes (comment) Precaution Comments: reviewed log roll, brace use Cervical Brace: Hard collar (can be removed for showering, toilet transfers, and in bed.  Can be donned and doffed EOB.) Restrictions Weight Bearing Restrictions Per Provider Order: No    Has the patient had 2 or more falls or a fall with injury in the past year?Yes   Prior Activity Level Household: Went out for appointments only.   Prior Functional Level Prior Function Prior Level of Function : Needs assist Mobility Comments: pt states he stays on the second floor and only goes downstairs for appts, had a fall on stairs a couple of months ago. pt has been walking short distances with RW, and states he has mostly been hopping and using RW like crutches to maintain bilat LE NWB  during swing phase of gait. pt reports 4 falls in the past 6 months ADLs Comments: wife does dressing and LB washup, has been getting in and out of tub but has had a  fall there   Self Care: Did the patient need help bathing, dressing, using the toilet or eating?  Needed some help   Indoor Mobility: Did the patient need assistance with walking from room to room (with or without device)? Needed some help   Stairs: Did the patient need assistance with internal or external stairs (with or without device)? Needed some help   Functional Cognition: Did the patient need help planning regular tasks such as shopping or remembering to take medications? Independent   Patient Information Are you of Hispanic, Latino/a,or Spanish origin?: A. No, not of Hispanic, Latino/a, or Spanish origin What is your race?: B. Black or African American Do you need or want an interpreter to communicate with a doctor or health care staff?: 0. No   Patient's Response To:  Health Literacy and Transportation Is the patient able to respond to health literacy and transportation needs?: Yes Health Literacy - How often do you need to have someone help you when you read instructions, pamphlets, or other written material from your doctor or pharmacy?: Never In the past 12 months, has lack of transportation kept you from medical appointments or from getting medications?: No In the past 12 months, has lack of transportation kept you from meetings, work, or from getting things needed for daily living?: No   Journalist, newspaper / Equipment Home Equipment: Information systems manager, Higher education careers adviser held shower head, Agricultural consultant (2 wheels), [employer] - single point, Wheelchair - manual, Crutches, Toilet riser   Prior Device Use: Indicate devices/aids used by the patient prior to current illness, exacerbation or injury? Manual wheelchair and Walker   Current Functional Level Cognition   Overall Cognitive Status: Within Functional Limits for tasks assessed Orientation Level: Oriented X4    Extremity Assessment (includes Sensation/Coordination)   Upper Extremity Assessment: Overall WFL for tasks assessed  Lower  Extremity Assessment: Defer to PT evaluation     ADLs   Overall ADL's : Needs assistance/impaired Eating/Feeding: Independent, Sitting Grooming: Wash/dry hands, Wash/dry face, Sitting, Set up Upper Body Bathing: Supervision/ safety, Sitting Lower Body Bathing: Moderate assistance, Sit to/from stand Upper Body Dressing : Supervision/safety, Sitting Lower Body Dressing: Maximal assistance, Sit to/from stand Toilet Transfer: Minimal assistance, Ambulation, Rolling walker (2 wheels), BSC/3in1 Toilet Transfer Details (indicate cue type and reason): simulated Toileting- Clothing Manipulation and Hygiene: Sit to/from stand, Moderate assistance Toileting - Clothing Manipulation Details (indicate cue type and reason): simulated Functional mobility during ADLs: Minimal assistance, Rolling walker (2 wheels) General ADL Comments: Pt with decreased ability to cross each LE over the opposite knee for dressing tasks.  Increased spasms noted with attempts to cross them over.  Sit to stand with increased stiffness in hips and knees and pt relying on UEs to push him up from the chair and then working his LEs back to allow for standing balance.  Provided handout for education on cervical precautions for selfcare and bed mobility.     Mobility   Overal bed mobility: Needs Assistance Bed Mobility: Rolling, Sidelying to Sit Rolling: Min assist, Used rails Sidelying to sit: Min assist, HOB elevated General bed mobility comments: assist for trunk and LE management, cues for sequencing task     Transfers   Overall transfer level: Needs assistance Equipment used: Rolling walker (2 wheels) Transfers: Sit to/from Stand,  Bed to chair/wheelchair/BSC Sit to Stand: Min assist Bed to/from chair/wheelchair/BSC transfer type:: Step pivot Step pivot transfers: Min assist General transfer comment: Pt able to use UEs primarily for sit to stand transtion up to standing with RW for support.  He would then walk his LEs back  toward the chair to achieve upright balance posture with knees extended throughout the process.  Flexed trunk noted in standing.     Ambulation / Gait / Stairs / Wheelchair Mobility   Ambulation/Gait General Gait Details: nt, LE spasms and pain     Posture / Balance Balance Overall balance assessment: Needs assistance, History of Falls Sitting-balance support: No upper extremity supported, Feet supported Sitting balance-Leahy Scale: Fair Standing balance support: Bilateral upper extremity supported, During functional activity Standing balance-Leahy Scale: Poor Standing balance comment: Pt needs BUE support for balance in standing     Special needs/care consideration Skin Post op neck incision dressing in place         Previous Home Environment (from acute therapy documentation) Living Arrangements: Spouse/significant other Available Help at Discharge: Family, Available 24 hours/day (iniitally has assist from significant other 24 hour but she plans to go back to work in January) Type of Home: House Home Layout: Two level, Bed/bath upstairs, 1/2 bath on main level Home Access: Stairs to enter Entrance Stairs-Rails: None Secretary/administrator of Steps: 2 Bathroom Shower/Tub: Engineer, manufacturing systems: Standard   Discharge Living Setting Plans for Discharge Living Setting: Patient's home, House, Lives with (comment) (Lives with SO [name].) Type of Home at Discharge: House Discharge Home Layout: Two level, 1/2 bath on main level, Bed/bath upstairs Alternate Level Stairs-Rails: Right Alternate Level Stairs-Number of Steps: 16 stairs Discharge Home Access: Stairs to enter Entrance Stairs-Rails: None Entrance Stairs-Number of Steps: 2 steps   Social/Family/Support Systems Patient Roles: Partner (Has SO, sister, brother) Solicitor Information: [name] Laural Benes - SO [phone] Anticipated Caregiver: SO Ability/Limitations of Caregiver: [name] plans to return to work in  January 2025. Caregiver Availability: Intermittent Discharge Plan Discussed with Primary Caregiver: Yes Is Caregiver In Agreement with Plan?: Yes Does Caregiver/Family have Issues with Lodging/Transportation while Pt is in Rehab?: No     Goals Patient/Family Goal for Rehab: PT/OT mod I and supervision goals Expected length of stay: 18-24 days Pt/Family Agrees to Admission and willing to participate: Yes Program Orientation Provided & Reviewed with Pt/Caregiver Including Roles  & Responsibilities: Yes     Decrease burden of Care through IP rehab admission: N/A     Possible need for SNF placement upon discharge: Not anticipated     Patient Condition: This patient's condition remains as documented in the consult dated 06/06/23, in which the Rehabilitation Physician determined and documented that the patient's condition is appropriate for intensive rehabilitative care in an inpatient rehabilitation facility. Will admit to inpatient rehab today.   Preadmission Screen Completed By:  Trish Mage, RN, 06/07/2023 10:02 AM ______________________________________________________________________   Discussed status with Dr. Riley Kill on 06/07/23 at 10:02 AM and received approval for admission today.   Admission Coordinator:  Domingo Pulse, time 10:02 AM/Date 06/07/23

## 2023-06-07 NOTE — H&P (Signed)
Physical Medicine and Rehabilitation Admission H&P   CC: Functional deficits secondary to cervical myelopathy  HPI: Leon Rodriguez is a 57 year old male with a history of worsening bilateral lower extremity numbness tingling and difficulty walking he has had balance difficulties progressively worsening for many months and at time of presentation was utilizing a wheelchair.  He also complains of spasticity in his lower extremities and some intermittent numbness and tingling in his hands.  Evaluated by Dr. Jake Samples and MRI of the cervical spine revealed C6-T1 severe cervical stenosis with cord compression consistent with myelopathy.  He underwent arthrodesis of C5-6-7, C7-T1 anterior interbody technique by Dr. Jake Samples on 12/11.  Currently receiving baclofen and diazepam for lower extremity spasms.  No mention of neurogenic bowel or bladder.  He remains in Aspen collar.  He is tolerating his diet.  Labs are unremarkable.  Past surgical history is significant for hiatal hernia repair, hip surgery as a teenager.  No other significant medical history.The patient requires inpatient medicine and rehabilitation evaluations and services for ongoing dysfunction secondary to cervical myelopathy.  Review of Systems  Constitutional:  Negative for fever.  HENT:  Negative for hearing loss.   Eyes: Negative.   Cardiovascular: Negative.   Gastrointestinal: Negative.   Genitourinary:  Positive for frequency and urgency.  Musculoskeletal:  Positive for back pain, falls and myalgias.  Skin:  Negative for rash.  Neurological:  Positive for sensory change and focal weakness.  Psychiatric/Behavioral:  The patient is not nervous/anxious.    Past Medical History:  Diagnosis Date   Arthritis    Hip pain    Past Surgical History:  Procedure Laterality Date   ANTERIOR CERVICAL DECOMP/DISCECTOMY FUSION N/A 06/05/2023   Procedure: ANTERIOR CERVICAL DISCECTOMY AND FUSION CERVICAL SIX-SEVEN/CERVICAL SEVEN-THORACIC ONE;   Surgeon: Bethann Goo, DO;  Location: [facility] OR;  Service: Neurosurgery;  Laterality: N/A;  3C   APPENDECTOMY     CIRCUMCISION     HIATAL HERNIA REPAIR     HIP SURGERY     at 21 and 57 yo   History reviewed. No pertinent family history. Social History:  reports that he has been smoking cigarettes and cigars. He has never used smokeless tobacco. He reports that he does not currently use alcohol. He reports current drug use. Drug: Marijuana. Allergies: No Known Allergies Medications Prior to Admission  Medication Sig Dispense Refill   Baclofen 5 MG TABS Take 1 tablet by mouth 3 (three) times daily as needed (muscle spasms).     famotidine (ZANTAC 360) 10 MG tablet Take 10 mg by mouth daily as needed for heartburn or indigestion.     acetaminophen (TYLENOL) 500 MG tablet Take 2 tablets (1,000 mg total) by mouth every 8 (eight) hours as needed for fever. (Patient not taking: Reported on 06/03/2023) 30 tablet 0      Home: Home Living Family/patient expects to be discharged to:: Private residence Living Arrangements: Spouse/significant other Available Help at Discharge: Family, Available 24 hours/day (iniitally has assist from significant other 24 hour but she plans to go back to work in January) Type of Home: House Home Access: Stairs to enter Secretary/administrator of Steps: 2 Entrance Stairs-Rails: None Home Layout: Two level, Bed/bath upstairs, 1/2 bath on main level Bathroom Shower/Tub: Engineer, manufacturing systems: Standard Home Equipment: Information systems manager, Higher education careers adviser held shower head, Agricultural consultant (2 wheels), [employer] - single point, Wheelchair - manual, Crutches, Toilet riser   Functional History: Prior Function Prior Level of Function : Needs assist Mobility Comments: pt states  he stays on the second floor and only goes downstairs for appts, had a fall on stairs a couple of months ago. pt has been walking short distances with RW, and states he has mostly been hopping and using RW like  crutches to maintain bilat LE NWB during swing phase of gait. pt reports 4 falls in the past 6 months ADLs Comments: wife does dressing and LB washup, has been getting in and out of tub but has had a fall there  Functional Status:  Mobility: Bed Mobility Overal bed mobility: Needs Assistance Bed Mobility: Rolling, Sidelying to Sit Rolling: Min assist, Used rails Sidelying to sit: Min assist, HOB elevated General bed mobility comments: assist for trunk and LE management, cues for sequencing task Transfers Overall transfer level: Needs assistance Equipment used: Rolling walker (2 wheels) Transfers: Sit to/from Stand, Bed to chair/wheelchair/BSC Sit to Stand: Min assist Bed to/from chair/wheelchair/BSC transfer type:: Step pivot Step pivot transfers: Min assist General transfer comment: Pt able to use UEs primarily for sit to stand transtion up to standing with RW for support.  He would then walk his LEs back toward the chair to achieve upright balance posture with knees extended throughout the process.  Flexed trunk noted in standing. Ambulation/Gait General Gait Details: nt, LE spasms and pain    ADL: ADL Overall ADL's : Needs assistance/impaired Eating/Feeding: Independent, Sitting Grooming: Wash/dry hands, Wash/dry face, Sitting, Set up Upper Body Bathing: Supervision/ safety, Sitting Lower Body Bathing: Moderate assistance, Sit to/from stand Upper Body Dressing : Supervision/safety, Sitting Lower Body Dressing: Maximal assistance, Sit to/from stand Toilet Transfer: Minimal assistance, Ambulation, Rolling walker (2 wheels), BSC/3in1 Toilet Transfer Details (indicate cue type and reason): simulated Toileting- Clothing Manipulation and Hygiene: Sit to/from stand, Moderate assistance Toileting - Clothing Manipulation Details (indicate cue type and reason): simulated Functional mobility during ADLs: Minimal assistance, Rolling walker (2 wheels) General ADL Comments: Pt with  decreased ability to cross each LE over the opposite knee for dressing tasks.  Increased spasms noted with attempts to cross them over.  Sit to stand with increased stiffness in hips and knees and pt relying on UEs to push him up from the chair and then working his LEs back to allow for standing balance.  Provided handout for education on cervical precautions for selfcare and bed mobility.  Cognition: Cognition Overall Cognitive Status: Within Functional Limits for tasks assessed Orientation Level: Oriented X4 Cognition Arousal: Alert Behavior During Therapy: WFL for tasks assessed/performed Overall Cognitive Status: Within Functional Limits for tasks assessed  Physical Exam: Blood pressure (!) 139/93, pulse 98, temperature 98.4 F (36.9 C), temperature source Oral, resp. rate 19, height 5\' 9"  (1.753 m), weight 93 kg, SpO2 98%. Physical Exam Constitutional:      General: He is not in acute distress.    Appearance: Normal appearance. He is not ill-appearing.  HENT:     Head: Normocephalic and atraumatic.     Right Ear: External ear normal.     Left Ear: External ear normal.     Nose: Nose normal.     Mouth/Throat:     Pharynx: Oropharynx is clear. No oropharyngeal exudate or posterior oropharyngeal erythema.  Eyes:     Extraocular Movements: Extraocular movements intact.     Conjunctiva/sclera: Conjunctivae normal.     Pupils: Pupils are equal, round, and reactive to light.  Neck:     Comments: Wearing aspen collar. Neck incision dressed and intact, minimal drainage Cardiovascular:     Rate and Rhythm: Normal rate  and regular rhythm.     Heart sounds: No murmur heard.    No gallop.  Pulmonary:     Effort: Pulmonary effort is normal. No respiratory distress.     Breath sounds: No wheezing.  Abdominal:     General: Bowel sounds are normal. There is no distension.     Palpations: Abdomen is soft.  Skin:    General: Skin is warm and dry.  Neurological:     Mental Status: He is  alert.     Comments: Alert and oriented x 3. Normal insight and awareness. Intact Memory. Normal language and speech. Cranial nerve exam unremarkable. MMT: RUE 5/5. LUE 5/5 deltoid, biceps, 4+ triceps, wrist extensors, hand intrinsics. Pt appears to be able to move all muscle both lower extremities but MMT inhibited by spasticity. Pt with decreased LT in finger tips and from inguinal region down. Indicated that LT was intact along trunk during sensory testing. Pt with hyperreflexia in LE's 3+. MAS is 3 to 4/4 in BLE's, particularly in quads, plantarflexors, and hip adductors. Tone was slightly improved from yesterday when I examined him.   Psychiatric:        Mood and Affect: Mood normal.        Behavior: Behavior normal.     No results found for this or any previous visit (from the past 48 hours). DG Cervical Spine 1 View Result Date: 06/05/2023 CLINICAL DATA:  Fusion EXAM: Intraoperative fluoroscopy COMPARISON:  None Available. FINDINGS: Four fluoroscopic spot images submitted for review demonstrate placement of anterior fixation plates and prosthetic disc material along the lower cervical spine. Imaging was obtained to aid in treatment. Please correlate with real-time fluoroscopy of 45.1 seconds. Cumulative dose 13.4 mGy IMPRESSION: Intraoperative fluoroscopy Electronically Signed   By: Karen Kays M.D.   On: 06/05/2023 18:44   DG C-Arm 1-60 Min-No Report Result Date: 06/05/2023 Fluoroscopy was utilized by the requesting physician.  No radiographic interpretation.   DG C-Arm 1-60 Min-No Report Result Date: 06/05/2023 Fluoroscopy was utilized by the requesting physician.  No radiographic interpretation.   DG C-Arm 1-60 Min-No Report Result Date: 06/05/2023 Fluoroscopy was utilized by the requesting physician.  No radiographic interpretation.      Blood pressure (!) 139/93, pulse 98, temperature 98.4 F (36.9 C), temperature source Oral, resp. rate 19, height 5\' 9"  (1.753 m), weight 93  kg, SpO2 98%.  Medical Problem List and Plan: 1. Functional deficits secondary to C6-T1 cervical myelopathy s/p ACDF 06/05/2023  -patient may shower with collar on. Will order replacement pads  -ELOS/Goals: 18-24 days, goals are mod I to supervision with self-care and mobility  2.  Antithrombotics: -DVT/anticoagulation:  Pharmaceutical: Heparin  -antiplatelet therapy: none  3. Pain Management: Tylenol as needed;   Norco 5-10 mg q 4 hours prn  - see spasticity discussion below 4. Mood/Behavior/Sleep: LCSW to evaluate and provide emotional support  -antipsychotic agents: n/a  5. Neuropsych/cognition: This patient is capable of making decisions on his own behalf.  6. Skin/Wound Care: Routine skin care checks   7. Fluids/Electrolytes/Nutrition: Routine Is and Os and follow-up chemistries  8. Significant LE spasticity:  -increased baclofen to 20mg  q8 hours today. Pt states that it's helping but that spasticity increases over the hour or so prior to the subsequent dose. Consider increasing to q6 hours over weekend  -prn valium  -rom, activity with PT 9. Neurogenic bowel:  -he is continent  -last bm yesterday  -senna-s 2 at bedtime, observe for pattern 10. Neurogenic bladder:  -  pt with frequency, generally continent. He does have some incontinence at night.  -unsure of PVR's.  -begin timed voids while awake  -check PVR's           Milinda Antis, PA-C 06/07/2023

## 2023-06-07 NOTE — Progress Notes (Signed)
Inpatient Rehab Admissions Coordinator:  There is a bed available for pt in CIR today. [name], PA-C is aware and in agreement. Pt, pt's significant other, NSG and TOC made aware.   Wolfgang Phoenix, MS, CCC-SLP Admissions Coordinator [phone]

## 2023-06-07 NOTE — Progress Notes (Signed)
Bilateral lower extremity venous duplex has been completed. Preliminary results can be found in CV Proc through chart review.   06/07/23 1:13 PM Olen Cordial RVT

## 2023-06-07 NOTE — Progress Notes (Signed)
Expand All Collapse All          Physical Medicine and Rehabilitation Consult Reason for Consult: Cervical myelopathy Referring Physician: Dawley     HPI: Leon Rodriguez is a 57 y.o. male with a history of worsening bilateral lower extremity weakness and sensory loss progressing over several months.  He had progressed to the point where patient was requiring a wheelchair for mobility.  Patient developed spasticity as well.  MRI of the cervical spine revealed a C6-T1 severe cervical stenosis with cord compression and myelopathy.  Patient presented on 06/05/2023 for C6-T1 ACDF by Dr. Jake Samples.  Patient with significant lower extremity spasms this morning.  Valium was added and baclofen was increased.  He remains in a cervical collar.  Therapy is pending this morning.  C6-T1 cervical myelopathy     Review of Systems  Constitutional:  Negative for fever.  HENT: Negative.    Eyes: Negative.   Respiratory: Negative.    Cardiovascular: Negative.   Gastrointestinal:  Positive for constipation.  Genitourinary:  Positive for frequency and urgency.  Musculoskeletal:  Positive for back pain and myalgias.  Skin:  Negative for rash.  Neurological:  Positive for sensory change and focal weakness. Dizziness: spasticity of LE's. Psychiatric/Behavioral:  Negative for substance abuse.         Past Medical History:  Diagnosis Date   Arthritis     Hip pain               Past Surgical History:  Procedure Laterality Date   APPENDECTOMY       CIRCUMCISION       HIATAL HERNIA REPAIR       HIP SURGERY        at 72 and 57 yo        History reviewed. No pertinent family history.     Social History:  reports that he has been smoking cigarettes and cigars. He has never used smokeless tobacco. He reports that he does not currently use alcohol. He reports current drug use. Drug: Marijuana. Allergies:  Allergies  No Known Allergies         Medications Prior to Admission  Medication Sig  Dispense Refill   Baclofen 5 MG TABS Take 1 tablet by mouth 3 (three) times daily as needed (muscle spasms).       famotidine (ZANTAC 360) 10 MG tablet Take 10 mg by mouth daily as needed for heartburn or indigestion.       acetaminophen (TYLENOL) 500 MG tablet Take 2 tablets (1,000 mg total) by mouth every 8 (eight) hours as needed for fever. (Patient not taking: Reported on 06/03/2023) 30 tablet 0          Home: Home Living Family/patient expects to be discharged to:: Private residence Living Arrangements: Spouse/significant other Available Help at Discharge: Family Type of Home: House Home Access: Stairs to enter Secretary/administrator of Steps: 2 Entrance Stairs-Rails: None Home Layout: Two level, Bed/bath upstairs Bathroom Shower/Tub: Tub/shower unit Home Equipment: Information systems manager, Higher education careers adviser held shower head, Agricultural consultant (2 wheels), [employer] - single point, Wheelchair - manual, Crutches  Functional History: Prior Function Prior Level of Function : Needs assist Mobility Comments: pt states he stays on the second floor and only goes downstairs for appts, had a fall on stairs a couple of months ago. pt has been walking short distances with RW. pt reports 4 falls in the past 6 months ADLs Comments: wife does dressing and LB washup, has been getting in and  out of tub but has had a fall there Functional Status:  Mobility:   ADL:   Cognition: Cognition Orientation Level: Oriented X4   Blood pressure 120/76, pulse (!) 111, temperature 98 F (36.7 C), temperature source Oral, resp. rate 16, height 5\' 9"  (1.753 m), weight 93 kg, SpO2 94%. Physical Exam Constitutional:      General: He is not in acute distress.    Appearance: He is normal weight.  HENT:     Head: Normocephalic.     Right Ear: External ear normal.     Left Ear: External ear normal.     Nose: Nose normal.     Mouth/Throat:     Mouth: Mucous membranes are moist.  Eyes:     Pupils: Pupils are equal, round, and reactive to  light.  Neck:     Comments: C-collar Cardiovascular:     Rate and Rhythm: Tachycardia present.  Pulmonary:     Effort: Pulmonary effort is normal.  Abdominal:     Palpations: Abdomen is soft.  Neurological:     Mental Status: He is alert.     Comments: Alert and oriented x 3. Normal insight and awareness. Intact Memory. Normal language and speech. Cranial nerve exam unremarkable. MMT: RUE 5/5. LUE 5/5 deltoid, biceps, 4+ triceps, wrist extension, HI. Pt moves both LE's but significantly inhibited by spasticity and pain. MAS of LE's 3-4/5 with pt going into full extensor spasms when I attempted to range his knees and feet.   Has sl tingling of finger tips bilaterally and 1/2 sensation below inguinal area bilaterally.  Psychiatric:        Mood and Affect: Mood normal.        Behavior: Behavior normal.        Lab Results Last 24 Hours  No results found for this or any previous visit (from the past 24 hours).    Imaging Results (Last 48 hours)  DG Cervical Spine 1 View Result Date: 06/05/2023 CLINICAL DATA:  Fusion EXAM: Intraoperative fluoroscopy COMPARISON:  None Available. FINDINGS: Four fluoroscopic spot images submitted for review demonstrate placement of anterior fixation plates and prosthetic disc material along the lower cervical spine. Imaging was obtained to aid in treatment. Please correlate with real-time fluoroscopy of 45.1 seconds. Cumulative dose 13.4 mGy IMPRESSION: Intraoperative fluoroscopy Electronically Signed   By: Karen Kays M.D.   On: 06/05/2023 18:44    DG C-Arm 1-60 Min-No Report Result Date: 06/05/2023 Fluoroscopy was utilized by the requesting physician.  No radiographic interpretation.    DG C-Arm 1-60 Min-No Report Result Date: 06/05/2023 Fluoroscopy was utilized by the requesting physician.  No radiographic interpretation.    DG C-Arm 1-60 Min-No Report Result Date: 06/05/2023 Fluoroscopy was utilized by the requesting physician.  No radiographic  interpretation.       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 Yes Co-Morbidities requiring supervision/potential complications:  -Severe lower extremity spasticity -Postoperative pain -Neurogenic bowel -Neurogenic bladder Due to bladder management, bowel management, safety, skin/wound care, disease management, medication administration, pain management, and patient education, does the patient require 24 hr/day rehab nursing? Yes Does the patient require coordinated care of a physician, rehab nurse, therapy disciplines of PT, OT to address physical and functional deficits in the context of the above medical diagnosis(es)? Yes  Addressing deficits in the following areas: balance, endurance, locomotion, strength, transferring, bowel/bladder control, bathing, dressing, feeding, grooming, toileting, and psychosocial support Can the patient actively participate in an intensive therapy program of at least 3 hrs of therapy per day at least 5 days per week? Yes The potential for patient to make measurable gains while on inpatient rehab is excellent Anticipated functional outcomes upon discharge from inpatient rehab are modified independent and supervision  with PT, modified independent and supervision with OT, n/a with SLP., +/- at w/c level Estimated rehab length of stay to reach the above functional goals is: 18-24 days Anticipated discharge destination: Home Overall Rehab/Functional Prognosis: excellent   POST ACUTE RECOMMENDATIONS: This patient's condition is appropriate for continued rehabilitative care in the following setting: CIR Patient has agreed to participate in recommended program. Yes Note that insurance prior authorization may be required for  reimbursement for recommended care.   Comment: Pt has been experiencing motor and sensory loss over the last year. Symptoms worsened to the point where he could no longer work or drive this past June. I told him that we should be able to help his spasticity and lower extremity strength and ROM, but given the chronicity of his myelopathy recovery will be a prolonged course. One blessing is that he has minimal involvement of his upper extremities.     MEDICAL RECOMMENDATIONS: He needs scheduled spasticity medication. I will start by changing baclofen to 10mg  q8 hours. I would titrate it up aggressively. Can continue valium prn Maintain mobility and ROM efforts with PT, OT Pain control as you are doing.  Recommend LE venous dopplers to rule out DVT given his chronic LE weakness. Also recommend sq lovenox 30mg  q12 for DVT prophylaxis Begin scheduled stool softener as he has early neurogenic bladder Timed voids for neurogenic bladder, scan/cath prn for vol >350 cc     I have personally performed a face to face diagnostic evaluation of this patient. Additionally, I have examined the patient's medical record including any pertinent labs and radiographic images. If the physician assistant has documented in this note, I have reviewed and edited or otherwise concur with the physician assistant's documentation.   Thanks,   Ranelle Oyster, MD 06/06/2023

## 2023-06-07 NOTE — Progress Notes (Signed)
Orthopedic Tech Progress Note Patient Details:  Leon Rodriguez 1965-09-21 [id number]  Aspen cervical collar replacement pads left with patient at bedside.   Ortho Devices Type of Ortho Device: Other (comment) Ortho Device/Splint Location: aspen cervial collar replacement pads Ortho Device/Splint Interventions: Leon Rodriguez 06/07/2023, 6:40 PM

## 2023-06-07 NOTE — Discharge Instructions (Signed)
Inpatient Rehab Discharge Instructions  Leon Rodriguez Discharge date and time:   Activities/Precautions/ Functional Status: Activity: no lifting, driving, or strenuous exercise until cleared by MD Diet: regular diet Wound Care: keep wound clean and dry Functional status:  ___ No restrictions     ___ Walk up steps independently ___ 24/7 supervision/assistance   ___ Walk up steps with assistance ___ Intermittent supervision/assistance  ___ Bathe/dress independently ___ Walk with walker     ___ Bathe/dress with assistance ___ Walk Independently    ___ Shower independently ___ Walk with assistance    ___ Shower with assistance ___ No alcohol     ___ Return to work/school ________  Special Instructions: No driving, alcohol consumption or tobacco use.    My questions have been answered and I understand these instructions. I will adhere to these goals and the provided educational materials after my discharge from the hospital.  Patient/Caregiver Signature _______________________________ Date __________  Clinician Signature _______________________________________ Date __________  Please bring this form and your medication list with you to all your follow-up doctor's appointments.

## 2023-06-08 ENCOUNTER — Other Ambulatory Visit: Payer: Self-pay

## 2023-06-08 ENCOUNTER — Encounter (HOSPITAL_COMMUNITY): Payer: Self-pay | Admitting: Physical Medicine and Rehabilitation

## 2023-06-08 DIAGNOSIS — G959 Disease of spinal cord, unspecified: Secondary | ICD-10-CM

## 2023-06-08 LAB — CBC WITH DIFFERENTIAL/PLATELET
Abs Immature Granulocytes: 0.04 10*3/uL (ref 0.00–0.07)
Basophils Absolute: 0 10*3/uL (ref 0.0–0.1)
Basophils Relative: 0 %
Eosinophils Absolute: 0.1 10*3/uL (ref 0.0–0.5)
Eosinophils Relative: 1 %
HCT: 33.6 % — ABNORMAL LOW (ref 39.0–52.0)
Hemoglobin: 12.2 g/dL — ABNORMAL LOW (ref 13.0–17.0)
Immature Granulocytes: 0 %
Lymphocytes Relative: 21 %
Lymphs Abs: 1.8 10*3/uL (ref 0.7–4.0)
MCH: 34.2 pg — ABNORMAL HIGH (ref 26.0–34.0)
MCHC: 36.3 g/dL — ABNORMAL HIGH (ref 30.0–36.0)
MCV: 94.1 fL (ref 80.0–100.0)
Monocytes Absolute: 0.9 10*3/uL (ref 0.1–1.0)
Monocytes Relative: 10 %
Neutro Abs: 6 10*3/uL (ref 1.7–7.7)
Neutrophils Relative %: 68 %
Platelets: 276 10*3/uL (ref 150–400)
RBC: 3.57 MIL/uL — ABNORMAL LOW (ref 4.22–5.81)
RDW: 12.4 % (ref 11.5–15.5)
WBC: 9 10*3/uL (ref 4.0–10.5)
nRBC: 0 % (ref 0.0–0.2)

## 2023-06-08 LAB — COMPREHENSIVE METABOLIC PANEL
ALT: 17 U/L (ref 0–44)
AST: 23 U/L (ref 15–41)
Albumin: 3.2 g/dL — ABNORMAL LOW (ref 3.5–5.0)
Alkaline Phosphatase: 96 U/L (ref 38–126)
Anion gap: 8 (ref 5–15)
BUN: 7 mg/dL (ref 6–20)
CO2: 26 mmol/L (ref 22–32)
Calcium: 9.5 mg/dL (ref 8.9–10.3)
Chloride: 103 mmol/L (ref 98–111)
Creatinine, Ser: 0.71 mg/dL (ref 0.61–1.24)
GFR, Estimated: >60 mL/min (ref >60)
Glucose, Bld: 109 mg/dL — ABNORMAL HIGH (ref 70–99)
Potassium: 3.9 mmol/L (ref 3.5–5.1)
Sodium: 137 mmol/L (ref 135–145)
Total Bilirubin: 1.2 mg/dL — ABNORMAL HIGH (ref <1.2)
Total Protein: 6.2 g/dL — ABNORMAL LOW (ref 6.5–8.1)

## 2023-06-08 MED ORDER — ORAL CARE MOUTH RINSE: 15.0000 mL | OROMUCOSAL | Status: DC | PRN

## 2023-06-08 MED ORDER — TIZANIDINE HCL 2 MG PO TABS
4.0000 mg | ORAL_TABLET | Freq: Four times a day (QID) | ORAL | Status: DC | PRN
  Administered 2023-06-08 – 2023-06-12 (×14): 4 mg via ORAL
  Filled 2023-06-08 (×14): qty 2

## 2023-06-08 MED ORDER — DIAZEPAM 5 MG PO TABS
5.0000 mg | ORAL_TABLET | Freq: Four times a day (QID) | ORAL | Status: DC
  Administered 2023-06-08 – 2023-06-13 (×21): 5 mg via ORAL
  Filled 2023-06-08 (×23): qty 1

## 2023-06-08 NOTE — Progress Notes (Signed)
Physical Therapy Session Note  Patient Details  Name: Leon Rodriguez MRN: [id number] Date of Birth: [birth year]  Today's Date: 06/08/2023 PT Missed Time: 60 Minutes Missed Time Reason: Patient fatigue  Pt received asleep, sitting in recliner with his significant other present. Pt wearing cervical collar. Pt unable to wake up at this time for meaningful participation in therapy session and politely requests to rest. MD aware of pt's lethargy and change in mobility level today due to this. Pt left seated in recliner with needs in reach and seat belt alarm on. Missed 60 minutes of skilled physical therapy.  Ginny Forth , PT, DPT, NCS, CSRS 06/08/2023, 8:02 AM

## 2023-06-08 NOTE — Evaluation (Signed)
Occupational Therapy Assessment and Plan  Patient Details  Name: Leon Rodriguez MRN: [id number] Date of Birth: 10/27/1965  OT Diagnosis: abnormal posture, acute pain, muscle weakness (generalized), and paraplegia at level impacting how he is able to access his environment.  Rehab Potential: Rehab Potential (ACUTE ONLY): Good ELOS: 3.5 wks   Today's Date: 06/08/2023 OT Individual Time: 0100-0215 OT Individual Time Calculation (min): 75 min     Hospital Problem: Principal Problem:   Cervical myelopathy (HCC)   Past Medical History:  Past Medical History:  Diagnosis Date   Arthritis    Hip pain    Past Surgical History:  Past Surgical History:  Procedure Laterality Date   ANTERIOR CERVICAL DECOMP/DISCECTOMY FUSION N/A 06/05/2023   Procedure: ANTERIOR CERVICAL DISCECTOMY AND FUSION CERVICAL SIX-SEVEN/CERVICAL SEVEN-THORACIC ONE;  Surgeon: Bethann Goo, DO;  Location: [facility] OR;  Service: Neurosurgery;  Laterality: N/A;  3C   APPENDECTOMY     CIRCUMCISION     HIATAL HERNIA REPAIR     HIP SURGERY     at 70 and 57 yo    Assessment & Plan Clinical Impression: Patient is a 57 y.o. year old male with a history of worsening bilateral lower extremity numbness tingling and difficulty walking he has had balance difficulties progressively worsening for many months and at time of presentation was utilizing a wheelchair. He also complains of spasticity in his lower extremities and some intermittent numbness and tingling in his hands. Evaluated by Dr. Jake Samples and MRI of the cervical spine revealed C6-T1 severe cervical stenosis with cord compression consistent with myelopathy. He underwent arthrodesis of C5-6-7, C7-T1 anterior interbody technique by Dr. Jake Samples on 12/11. Currently receiving baclofen and diazepam for lower extremity spasms. No mention of neurogenic bowel or bladder. He remains in Aspen collar. He is tolerating his diet. Labs are unremarkable. Past surgical history is significant for  hiatal hernia repair, hip surgery as a teenager. No other significant medical history.The patient requires inpatient medicine and rehabilitation evaluations and services for ongoing dysfunction secondary to cervical myelopathy.   Patient transferred to CIR on 06/07/2023 .   Patient transferred to CIR on 06/07/2023 .    Patient currently requires min  to Max A with basic self-care skills secondary to muscle weakness and abnormal tone, decreased coordination, and decreased motor planning.  Prior to hospitalization, patient could complete BADL with modified independent .  Patient will benefit from skilled intervention to increase independence with basic self-care skills prior to discharge home with care partner.  Anticipate patient will require 24 hour supervision and follow up home health.  OT - End of Session Activity Tolerance: Tolerates 30+ min activity with multiple rests Endurance Deficit: Yes Endurance Deficit Description: pt with severe fatigue and lethargy dozing off repeatedly during session OT Assessment Rehab Potential (ACUTE ONLY): Good OT Patient demonstrates impairments in the following area(s): Endurance;Pain;Balance OT Transfers Functional Problem(s): Toilet;Tub/Shower OT Plan OT Frequency: Total of 15 hours over 7 days of combined therapies;5 out of 7 days OT Duration/Estimated Length of Stay: 3.5 wks OT Treatment/Interventions: DME/adaptive equipment instruction;Therapeutic Activities;UE/LE Strength taining/ROM;Therapeutic Exercise;Self Care/advanced ADL retraining;Balance/vestibular training;UE/LE Coordination activities;Patient/family education OT Self Feeding Anticipated Outcome(s): ModI OT Basic Self-Care Anticipated Outcome(s): ModI OT Toileting Anticipated Outcome(s): ModI OT Bathroom Transfers Anticipated Outcome(s): Modi OT Recommendation Patient destination: Home Follow Up Recommendations: Home health OT (based on functional status at the time of d/c) Equipment  Recommended: Rolling walker with 5" wheels;Tub/shower bench;3 in 1 bedside comode   OT Evaluation Precautions/Restrictions  Precautions Precautions:  Fall;Cervical;Other (comment) Precaution Comments: hx of many falls, severe B LE hypertonia with muscle spasms Required Braces or Orthoses: Cervical Brace Cervical Brace: Hard collar (to be worn out of bed) Restrictions Weight Bearing Restrictions Per Provider Order: No General Chart Reviewed: Yes PT Missed Treatment Reason: Patient fatigue (nodding off on occassion) Response to Previous Treatment: Patient reporting fatigue but able to participate Family/Caregiver Present: Yes Vital Signs Therapy Vitals Pulse Rate: 96 BP: 127/71 Patient Position (if appropriate): Sitting Oxygen Therapy SpO2: 100 % O2 Device: Room Air Patient Activity (if Appropriate): In chair Pain Pain Assessment Pain Scale: Faces (muscle spasms with BLE every 3-5 minutes) Pain Score: 8  Pain Type: Other (Comment) Pain Location: Leg Pain Orientation: Left;Right Pain Descriptors / Indicators: Spasm Pain Onset: On-going Patients Stated Pain Goal: 1 Pain Intervention(s): Medication (See eMAR) Multiple Pain Sites: No Home Living/Prior Functioning Home Living Living Arrangements: Spouse/significant other Available Help at Discharge: Family, Available 24 hours/day Type of Home: House Home Access: Stairs to enter (1 stair at the entrance and 16 stairs to the bedroom.) Entrance Stairs-Number of Steps: 1 (at the entrance) Entrance Stairs-Rails: None Home Layout: Two level, Bed/bath upstairs, 1/2 bath on main level (shower /tub combination) Bathroom Shower/Tub: Engineer, manufacturing systems: Standard Additional Comments: reports started using w/c in June 2024 - he uses the wheelchair downstairs for more mobility, goes downstairs 1x/day to smoke a cigar (but hasn't been downstairs in ~44month PTA), uses RW upstairs for very short distance gait to/from bathroom for  BM otherwise uses urinal for bladder -- S.O. 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 position (per staff and family report)  Lives With: Significant other IADL History Homemaking Responsibilities: No Prior Function Level of Independence: Requires assistive device for independence (RW and W/C for assistance with getting around.)  Able to Take Stairs?: Yes (per chart) Driving: No Vision Baseline Vision/History: 0 No visual deficits Ability to See in Adequate Light: 0 Adequate Patient Visual Report: No change from baseline Additional Comments: pt able to track and converge Perception  Perception: Within Functional Limits Praxis   Cognition Cognition Overall Cognitive Status: Within Functional Limits for tasks assessed Arousal/Alertness: Lethargic (secondary to medication prior to session.) Attention: Focused;Sustained Focused Attention: Appears intact Sustained Attention: Impaired (secondary to being sleepy) Safety/Judgment: Appears intact Brief Interview for Mental Status (BIMS) Repetition of Three Words (First Attempt): 2 Temporal Orientation: Year: Correct Temporal Orientation: Month: Accurate within 5 days Temporal Orientation: Day: Correct Recall: "Sock": Yes, no cue required Recall: "Blue": Yes, no cue required Recall: "Bed": No, could not recall BIMS Summary Score: 12 Sensation Sensation Light Touch: Impaired Detail (able to detect stiumli with vison occluded, but presented with spasms with BLE) Peripheral sensation comments: lacks LT sensation in B LEs Light Touch Impaired Details:  (Able to detect stimuli, but presented with spasms.) Hot/Cold: Not tested Coordination Gross Motor Movements are Fluid and Coordinated: No Fine Motor Movements are Fluid and Coordinated: No (limitation with strength,  manipulation,  and bilateral hand coordination) Coordination and Movement Description:  impaired due to B LE muscle spasms, weakness, and sensory deficits Motor  Motor Motor: Abnormal tone;Paraplegia;Other (comment);Abnormal postural alignment and control (per presentation and chart review.) Motor - Skilled Clinical Observations: severe B LE hypertonia with muscle spasms, B LE weakness, and sensory deficits  Trunk/Postural Assessment  Thoracic Assessment Thoracic Assessment: Exceptions to [facility] Postural Control Postural Control: Deficits on evaluation Trunk Control:  impaired and stays in a posterior pelvic tilt position due to B LE extensor tone  Balance Static Sitting Balance Static Sitting - Balance Support: Feet supported;Bilateral upper extremity supported Static Sitting - Level of Assistance: 4: Min assist;3: Mod assist Dynamic Sitting Balance Dynamic Sitting - Balance Support: Feet supported Dynamic Sitting - Level of Assistance: 1: +1 Total assist Static Standing Balance Static Standing - Balance Support: During functional activity;Bilateral upper extremity supported Static Standing - Level of Assistance: Not tested (comment) Extremity/Trunk Assessment RUE Assessment Passive Range of Motion (PROM) Comments: Limited PROM Active Range of Motion (AROM) Comments: Patient able to range BUE into flexion while adhering to precautionary measures General Strength Comments: 3/5MMT  BUE LUE Assessment Passive Range of Motion (PROM) Comments: Limitation PROM secondary to precautionary measures. Active Range of Motion (AROM) Comments: Patietn able to demonstrate AROM , however, he is adhering to precautionary measures and presented with limited shld flexion. General Strength Comments: 3/5MMT  Care Tool Care Tool Self Care Eating   Eating Assist Level: Set up assist    Oral Care    Oral Care Assist Level: Set up assist    Bathing   Body parts bathed by patient: Right arm;Left arm;Chest;Abdomen;Front perineal area;Right upper leg;Left upper leg;Face Body parts bathed by  helper: Left lower leg;Right lower leg;Buttocks   Assist Level: Moderate Assistance - Patient 50 - 74% (based on simulated task and his inability to demonstrate come from sit to stand for LB task performance.)    Upper Body Dressing(including orthotics)   What is the patient wearing?: Pull over shirt   Assist Level: Set up assist (using modified technique)    Lower Body Dressing (excluding footwear)   What is the patient wearing?: Pants;Incontinence brief Assist for lower body dressing: Total Assistance - Patient < 25%    Putting on/Taking off footwear   What is the patient wearing?: Non-skid slipper socks Assist for footwear: Total Assistance - Patient < 25%       Care Tool Toileting Toileting activity   Assist for toileting: Total Assistance - Patient < 25%     Care Tool Bed Mobility Roll left and right activity   Roll left and right assist level: Total Assistance - Patient < 25%    Sit to lying activity   Sit to lying assist level: Total Assistance - Patient < 25%    Lying to sitting on side of bed activity   Lying to sitting on side of bed assist level: the ability to move from lying on the back to sitting on the side of the bed with no back support.:  (based on chart review and observation of current functioal status)     Care Tool Transfers Sit to stand transfer    Dependent    Chair/bed transfer    Dependent     Toilet transfer   Assist Level: Total Assistance - Patient < 25% (based on current functional status)     Care Tool Cognition  Expression of Ideas and Wants Expression of Ideas and Wants: 4. Without difficulty (complex and basic) - expresses complex messages without difficulty and with speech that is clear and easy to understand  Understanding Verbal and Non-Verbal Content Understanding Verbal and Non-Verbal Content: 4. Understands (complex and basic) - clear comprehension without cues or repetitions   Memory/Recall Ability Memory/Recall Ability :  Current season   Refer to Care Plan for Long Term Goals  SHORT TERM GOAL WEEK 1    Recommendations for other services: None  Skilled Therapeutic Intervention  Patient seen this date for skilled OT assessment with family present at the time of treatment. Patient able to verbalize spinal precautions while adhering to precautionary measures of wearing hard collar when OOB. Patient presents with challenges in relation to BADL related task in LB performance secondary to increase tone and muscle spasms impacting his functional mobility and functional task performance.  Prior to most recent episode, the pt is said to complete BADL related task with ModI using a RW for short distance and a w/c for long distance. At the time of this assessment, the pt was able to complete a simulated task in bathing / dressing UB/LB at [facility] for UB and MaxA for LB secondary to increase tone  and spasm impacting the pt's ability complete static or dynamic standing task.   The pt presented as total assist for functional mobility and demonstrates UB strength of 3/5 MMT impacting his activity tolerance . During the assessment,  the pt was instructed in exercises to aid with decreasing LB tone and was told  to incorporate relaxation breathing to assist with reducing the intensity  of  his muscle spasms. Based on the pt's current functional status it is recommended that the pt receives 3.5 weeks of skilled OT to improve  his  independence with UB/LB BADL's task performance in bathing and dressing, toileting, functional t/f's, core strength, and activity tolerance to maximize his rehab  potential.  The pt is recommended for skilled OT 3.5 weeks with home health to follow for a safe and Ind transition to home.  Modification to  be made in the POC as needed.  ADL Equipment Provided:  (would benefit from long handle sponge, reacher, and sock aide) Eating: Set up (based on functional status) Where Assessed-Eating: Chair Grooming:  Setup Upper Body Bathing: Setup Where Assessed-Upper Body Bathing: Chair (simulated task in bathing UB) Lower Body Bathing: Moderate assistance;Maximal assistance Where Assessed-Lower Body Bathing: Chair Upper Body Dressing: Setup (With LB secondary to challeges with coming from sit to stand and limitation with going into flexion.) Where Assessed-Upper Body Dressing: Chair Lower Body Dressing: Moderate assistance;Maximal assistance Where Assessed-Lower Body Dressing: Chair Toileting: Maximal assistance Where Assessed-Toileting:  (based on observation doing funcitonal task performance and the present tone associated with BLE's) Toilet Transfer: Maximal assistance (MaxA to total) Toilet Transfer Method: Other (comment) (stedy secondary to increase tone with BLE impacting his ability to stand.) Toilet Transfer Equipment: Raised toilet seat (stedy for transport) Tub/Shower Transfer: Dependent;Maximal assistance (MaxA to Dep secondary to his increase tone impacting his ability to stand) Tub/Shower Transfer Method:  Surveyor, quantity) Tub/Shower Equipment: Information systems manager with back Film/video editor: Dependent (Maxi secondary to tone impacting standing) Mobility  Bed Mobility Bed Mobility: Not assessed Supine to Sit: Total Assistance - Patient < 25% (per report secondary to BLE increase tone and spasms.) Sit to Supine: Total Assistance - Patient < 25% Transfers Sit to Stand: Total Assistance - Patient < 25%;2 Helpers (per chart review secondary to increase tone and spasms.) Stand to Sit: Total Assistance - Patient < 25%;2 Helpers (per observation.)   Discharge Criteria: Patient will be discharged from OT if patient refuses treatment 3 consecutive times without medical reason, if treatment goals not met, if there is a change in medical status, if patient makes no progress towards goals or if patient is discharged from hospital.  The above assessment, treatment plan, treatment alternatives and goals were  discussed and mutually agreed upon: by patient  Lavona Mound 06/08/2023, 6:39  PM

## 2023-06-08 NOTE — Plan of Care (Signed)
  Problem: RH Balance Goal: LTG Patient will maintain dynamic sitting balance (PT) Description: LTG:  Patient will maintain dynamic sitting balance with assistance during mobility activities (PT) Flowsheets (Taken 06/08/2023 1234) LTG: Pt will maintain dynamic sitting balance during mobility activities with:: Independent with assistive device  Goal: LTG Patient will maintain dynamic standing balance (PT) Description: LTG:  Patient will maintain dynamic standing balance with assistance during mobility activities (PT) Flowsheets (Taken 06/08/2023 1234) LTG: Pt will maintain dynamic standing balance during mobility activities with:: Contact Guard/Touching assist   Problem: Sit to Stand Goal: LTG:  Patient will perform sit to stand with assistance level (PT) Description: LTG:  Patient will perform sit to stand with assistance level (PT) Flowsheets (Taken 06/08/2023 1234) LTG: PT will perform sit to stand in preparation for functional mobility with assistance level: Contact Guard/Touching assist   Problem: RH Bed Mobility Goal: LTG Patient will perform bed mobility with assist (PT) Description: LTG: Patient will perform bed mobility with assistance, with/without cues (PT). Flowsheets (Taken 06/08/2023 1234) LTG: Pt will perform bed mobility with assistance level of: Contact Guard/Touching assist   Problem: RH Bed to Chair Transfers Goal: LTG Patient will perform bed/chair transfers w/assist (PT) Description: LTG: Patient will perform bed to chair transfers with assistance (PT). Flowsheets (Taken 06/08/2023 1234) LTG: Pt will perform Bed to Chair Transfers with assistance level: Contact Guard/Touching assist   Problem: RH Car Transfers Goal: LTG Patient will perform car transfers with assist (PT) Description: LTG: Patient will perform car transfers with assistance (PT). Flowsheets (Taken 06/08/2023 1234) LTG: Pt will perform car transfers with assist:: Contact Guard/Touching assist    Problem: RH Ambulation Goal: LTG Patient will ambulate in controlled environment (PT) Description: LTG: Patient will ambulate in a controlled environment, # of feet with assistance (PT). Flowsheets (Taken 06/08/2023 1234) LTG: Pt will ambulate in controlled environ  assist needed:: Contact Guard/Touching assist LTG: Ambulation distance in controlled environment: 21ft using LRAD Goal: LTG Patient will ambulate in home environment (PT) Description: LTG: Patient will ambulate in home environment, # of feet with assistance (PT). Flowsheets (Taken 06/08/2023 1234) LTG: Pt will ambulate in home environ  assist needed:: Contact Guard/Touching assist LTG: Ambulation distance in home environment: 29ft using LRAD   Problem: RH Stairs Goal: LTG Patient will ambulate up and down stairs w/assist (PT) Description: LTG: Patient will ambulate up and down # of stairs with assistance (PT) Flowsheets (Taken 06/08/2023 1234) LTG: Pt will ambulate up/down stairs assist needed:: Minimal Assistance - Patient > 75% LTG: Pt will  ambulate up and down number of stairs: simulated flight of stairs to access bedroom per home set-up   Problem: RH Wheelchair Mobility Goal: LTG Patient will propel w/c in controlled environment (PT) Description: LTG: Patient will propel wheelchair in controlled environment, # of feet with assist (PT) Flowsheets (Taken 06/08/2023 1235) LTG: Pt will propel w/c in controlled environ  assist needed:: Independent with assistive device LTG: Propel w/c distance in controlled environment: 138ft Goal: LTG Patient will propel w/c in home environment (PT) Description: LTG: Patient will propel wheelchair in home environment, # of feet with assistance (PT). Flowsheets (Taken 06/08/2023 1235) LTG: Pt will propel w/c in home environ  assist needed:: Independent with assistive device LTG: Propel w/c distance in home environment: 19ft

## 2023-06-08 NOTE — Progress Notes (Signed)
PROGRESS NOTE   Subjective/Complaints:   Pt reports rough night- bed uncomfortable.  Got pain meds, but didn't help spasms much.   Pt mainly describes severely painful spasms.   Most  night had severe spasms, only the last interval was "spasm free".  Spasms in back down to feet from beginning.   LBM 2 days ago. Denies constipation.   ROS:  Pt denies SOB, abd pain, CP, N/V/C/D, and vision changes  Negative except for HPI  Objective:   VAS [place] LOWER EXTREMITY VENOUS (DVT) Result Date: 06/07/2023  Lower Venous DVT Study Patient Name:  Leon Rodriguez  Date of Exam:   06/07/2023 Medical Rec #: [id number]         Accession #:    [id number] Date of Birth: May 24, 1966         Patient Gender: M Patient Age:   57 years Exam Location:  [facility] Procedure:      VAS [place] LOWER EXTREMITY VENOUS (DVT) Referring Phys: Huntley Dec TOMLINSON --------------------------------------------------------------------------------  Indications: Pain.  Risk Factors: None identified. Limitations: Poor ultrasound/tissue interface and patient immobility, patient positioning, patient pain tolerance, leg spasms. Comparison Study: No prior studies. Performing Technologist: Chanda Busing RVT  Examination Guidelines: A complete evaluation includes B-mode imaging, spectral Doppler, color Doppler, and power Doppler as needed of all accessible portions of each vessel. Bilateral testing is considered an integral part of a complete examination. Limited examinations for reoccurring indications may be performed as noted. The reflux portion of the exam is performed with the patient in reverse Trendelenburg.  +---------+---------------+---------+-----------+----------+-------------------+ RIGHT    CompressibilityPhasicitySpontaneityPropertiesThrombus Aging      +---------+---------------+---------+-----------+----------+-------------------+ CFV      Full           Yes       Yes                                      +---------+---------------+---------+-----------+----------+-------------------+ SFJ      Full                                                             +---------+---------------+---------+-----------+----------+-------------------+ FV Prox                 Yes      Yes                                      +---------+---------------+---------+-----------+----------+-------------------+ FV Mid                  Yes      Yes                                      +---------+---------------+---------+-----------+----------+-------------------+ FV Distal  Yes      Yes                                      +---------+---------------+---------+-----------+----------+-------------------+ PFV                                                   Not well visualized +---------+---------------+---------+-----------+----------+-------------------+ POP      Full           Yes      Yes                                      +---------+---------------+---------+-----------+----------+-------------------+ PTV      Full                                                             +---------+---------------+---------+-----------+----------+-------------------+ PERO     Full                                                             +---------+---------------+---------+-----------+----------+-------------------+   +---------+---------------+---------+-----------+----------+-------------------+ LEFT     CompressibilityPhasicitySpontaneityPropertiesThrombus Aging      +---------+---------------+---------+-----------+----------+-------------------+ CFV      Full           Yes      Yes                                      +---------+---------------+---------+-----------+----------+-------------------+ SFJ      Full                                                              +---------+---------------+---------+-----------+----------+-------------------+ FV Prox  Full                                                             +---------+---------------+---------+-----------+----------+-------------------+ FV Mid                  Yes      Yes                                      +---------+---------------+---------+-----------+----------+-------------------+ FV Distal               Yes      Yes                                      +---------+---------------+---------+-----------+----------+-------------------+  PFV                                                   Not well visualized +---------+---------------+---------+-----------+----------+-------------------+ POP      Full           Yes      Yes                                      +---------+---------------+---------+-----------+----------+-------------------+ PTV      Full                                                             +---------+---------------+---------+-----------+----------+-------------------+ PERO     Full                                                             +---------+---------------+---------+-----------+----------+-------------------+     Summary: RIGHT: - There is no evidence of deep vein thrombosis in the lower extremity. However, portions of this examination were limited- see technologist comments above.  - No cystic structure found in the popliteal fossa.  LEFT: - There is no evidence of deep vein thrombosis in the lower extremity. However, portions of this examination were limited- see technologist comments above.  - No cystic structure found in the popliteal fossa.  *See table(s) above for measurements and observations.    Preliminary    Recent Labs    06/08/23 0539  WBC 9.0  HGB 12.2*  HCT 33.6*  PLT 276   Recent Labs    06/08/23 0539  NA 137  K 3.9  CL 103  CO2 26  GLUCOSE 109*  BUN 7  CREATININE 0.71  CALCIUM 9.5     Intake/Output Summary (Last 24 hours) at 06/08/2023 1219 Last data filed at 06/08/2023 0720 Gross per 24 hour  Intake 240 ml  Output 1200 ml  Net -960 ml        Physical Exam: Vital Signs Blood pressure (!) 103/57, pulse 91, temperature 98.2 F (36.8 C), resp. rate 18, height 5\' 9"  (1.753 m), weight 93 kg, SpO2 99%.   General: awake, alert, appropriate, sitting EOB, but swaying and needs B/L UB support; NAD HENT: conjugate gaze; oropharynx dry- no water at bedside-  CV: regular rate and rhythm; no JVD Pulmonary: CTA B/L; no W/R/R- good air movement GI: soft, NT, ND, (+)BS Psychiatric: appropriate- frustrated about overnight and spasms Neurological: Ox3 MAS of 3- but main issue was extensor tone with mild movement- and SEVERE spasms-with no ROM-  Can elicit sustained clonus B/L but also sets off extensor tone- lasted almost 2 minutes Neurological:     Mental Status: He is alert.     Comments: Alert and oriented x 3. Normal insight and awareness. Intact Memory. Normal language and speech. Cranial nerve exam unremarkable. MMT: RUE 5/5. LUE 5/5 deltoid, biceps, 4+ triceps, wrist extensors, hand intrinsics. Pt  appears to be able to move all muscle both lower extremities but MMT inhibited by spasticity. Pt with decreased LT in finger tips and from inguinal region down. Indicated that LT was intact along trunk during sensory testing. Pt with hyperreflexia in LE's 3+. MAS is 3 to 4/4 in BLE's, particularly in quads, plantarflexors, and hip adductors. Tone was slightly improved from yesterday when I examined him.     Assessment/Plan: 1. Functional deficits which require 3+ hours per day of interdisciplinary therapy in a comprehensive inpatient rehab setting. Physiatrist is providing close team supervision and 24 hour management of active medical problems listed below. Physiatrist and rehab team continue to assess barriers to discharge/monitor patient progress toward functional and  medical goals  Care Tool:  Bathing              Bathing assist       Upper Body Dressing/Undressing Upper body dressing        Upper body assist      Lower Body Dressing/Undressing Lower body dressing            Lower body assist       Toileting Toileting    Toileting assist       Transfers Chair/bed transfer  Transfers assist     Chair/bed transfer assist level: 2 Helpers     Locomotion Ambulation   Ambulation assist   Ambulation activity did not occur: Safety/medical concerns          Walk 10 feet activity   Assist  Walk 10 feet activity did not occur: Safety/medical concerns        Walk 50 feet activity   Assist Walk 50 feet with 2 turns activity did not occur: Safety/medical concerns         Walk 150 feet activity   Assist           Walk 10 feet on uneven surface  activity   Assist Walk 10 feet on uneven surfaces activity did not occur: Safety/medical concerns         Wheelchair     Assist Is the patient using a wheelchair?: No (was not safe to attempt transferring into w/c at this time, but yes will use w/c) Type of Wheelchair: Manual Wheelchair activity did not occur: Safety/medical concerns         Wheelchair 50 feet with 2 turns activity    Assist    Wheelchair 50 feet with 2 turns activity did not occur: Safety/medical concerns       Wheelchair 150 feet activity     Assist  Wheelchair 150 feet activity did not occur: Safety/medical concerns       Blood pressure (!) 103/57, pulse 91, temperature 98.2 F (36.8 C), resp. rate 18, height 5\' 9"  (1.753 m), weight 93 kg, SpO2 99%.  Medical Problem List and Plan: 1. Functional deficits secondary to chronic C6-T1 cervical myelopathy s/p ACDF 06/05/2023             -patient may shower with collar on. Will order replacement pads             -ELOS/Goals: 18-24 days, goals are mod I to supervision with self-care and mobility   Admit to  CIR- first day of evaluations- difficulty tolerating due to lack of sleep as well severe spasticity  - needed significant titration of spasticity agents 2.  Antithrombotics: -DVT/anticoagulation:  Pharmaceutical: Heparin             -antiplatelet therapy: none  3. Pain Management: Tylenol as needed;   Norco 5-10 mg q 4 hours prn             - see spasticity discussion below 4. Mood/Behavior/Sleep: LCSW to evaluate and provide emotional support             -antipsychotic agents: n/a   5. Neuropsych/cognition: This patient is capable of making decisions on his own behalf.   6. Skin/Wound Care: Routine skin care checks   7. Fluids/Electrolytes/Nutrition: Routine Is and Os and follow-up chemistries   8. Significant LE spasticity:             -increased baclofen to 20mg  q8 hours today. Pt states that it's helping but that spasticity increases over the hour or so prior to the subsequent dose. Consider increasing to q6 hours over weekend BP/SE permitting             -valium 5mg  q6 prn             -rom, activity with PT  12/14- spasticity still severe- no improvement with "robaxin, flexeril"- will change Valium to 5 mg q6 Hours- and see if can tolerate- since more spasms, than tone- and added Zanaflex 4 mg q6 hours prn- for more spasms.  9. Neurogenic bowel:             -he is continent             -last bm yesterday             -senna-s 2 at bedtime, observe for pattern  12/14- LBM 2 days ago- likely constipation in setting of neurogenic bowel- if no BM by tomorrow, will intervene 10. Neurogenic bladder:             -pt with frequency, generally continent. He does have some incontinence at night.             -unsure of PVR's.             -begin timed voids while awake             -check PVR's  12/14- ordered bladder scans- q6 hours- to make sure no problems with retention- will order for 3 days   I spent a total of 52   minutes on total care today- >50% coordination of care- due to  D/w  nursing x3 and PT about function- - also prolonged time with pt dealing with spasticity and checked on again.          LOS: 1 days A FACE TO FACE EVALUATION WAS PERFORMED  Chloee Tena 06/08/2023, 12:19 PM

## 2023-06-08 NOTE — Evaluation (Signed)
Physical Therapy Assessment and Plan  Patient Details  Name: Leon Rodriguez MRN: [id number] Date of Birth: [birth year]  PT Diagnosis: Abnormal posture, Abnormality of gait, Difficulty walking, Edema, Hypertonia, Impaired sensation, Muscle spasms, Muscle weakness, Paraplegia, and Pain in neck Rehab Potential: Good ELOS: ~3.5 weeks   Today's Date: 06/08/2023 PT Individual Time: 0901-1010 PT Individual Time Calculation (min): 69 min    Hospital Problem: Principal Problem:   Cervical myelopathy (HCC)   Past Medical History:  Past Medical History:  Diagnosis Date   Arthritis    Hip pain    Past Surgical History:  Past Surgical History:  Procedure Laterality Date   ANTERIOR CERVICAL DECOMP/DISCECTOMY FUSION N/A 06/05/2023   Procedure: ANTERIOR CERVICAL DISCECTOMY AND FUSION CERVICAL SIX-SEVEN/CERVICAL SEVEN-THORACIC ONE;  Surgeon: Leon Goo, DO;  Location: [facility] OR;  Service: Neurosurgery;  Laterality: N/A;  3C   APPENDECTOMY     CIRCUMCISION     HIATAL HERNIA REPAIR     HIP SURGERY     at 74 and 57 yo    Assessment & Plan Clinical Impression: Patient is a 57 y.o. year old male with a history of worsening bilateral lower extremity numbness tingling and difficulty walking he has had balance difficulties progressively worsening for many months and at time of presentation was utilizing a wheelchair. He also complains of spasticity in his lower extremities and some intermittent numbness and tingling in his hands. Evaluated by Dr. Jake Rodriguez and MRI of the cervical spine revealed C6-T1 severe cervical stenosis with cord compression consistent with myelopathy. He underwent arthrodesis of C5-6-7, C7-T1 anterior interbody technique by Dr. Jake Rodriguez on 12/11. Currently receiving baclofen and diazepam for lower extremity spasms. No mention of neurogenic bowel or bladder. He remains in [place]en collar. He is tolerating his diet. Labs are unremarkable. Past surgical history is significant for hiatal  hernia repair, hip surgery as a teenager. No other significant medical history.The patient requires inpatient medicine and rehabilitation evaluations and services for ongoing dysfunction secondary to cervical myelopathy.   Patient transferred to CIR on 06/07/2023 .   Patient currently requires  +2 total A  with mobility secondary to muscle weakness and muscle joint tightness, decreased cardiorespiratoy endurance, impaired timing and sequencing, abnormal tone, and unbalanced muscle activation,  , and decreased sitting balance, decreased standing balance, decreased postural control, and decreased balance strategies.  Prior to hospitalization, patient was modified independent  with mobility and lived with Significant other (Leon Rodriguez, significant other) in a House home.  Home access is 1 .  Patient will benefit from skilled PT intervention to maximize safe functional mobility, minimize fall risk, and decrease caregiver burden for planned discharge home with 24 hour assist.  Anticipate patient will benefit from follow up [name] at discharge.  PT - End of Session Activity Tolerance: Tolerates 30+ min activity with multiple rests Endurance Deficit: Yes Endurance Deficit Description: pt with severe fatigue and lethargy dozing off repeatedly during session PT Assessment Rehab Potential (ACUTE/IP ONLY): Good PT Barriers to Discharge: Home environment access/layout;Inaccessible home environment;Decreased caregiver support;Lack of/limited family support;Incontinence PT Patient demonstrates impairments in the following area(s): Balance;Behavior;Safety;Sensory;Edema;Skin Integrity;Endurance;Motor;Pain;Nutrition PT Transfers Functional Problem(s): Bed Mobility;Bed to Chair;Car;Furniture;Floor PT Locomotion Functional Problem(s): Ambulation;Wheelchair Mobility;Stairs PT Plan PT Intensity: Minimum of 1-2 x/day ,45 to 90 minutes PT Frequency: 5 out of 7 days PT Duration Estimated Length of Stay: ~3.5 weeks PT  Treatment/Interventions: Ambulation/gait training;Community reintegration;DME/adaptive equipment instruction;Neuromuscular re-education;Stair training;Psychosocial support;UE/LE Strength taining/ROM;Wheelchair propulsion/positioning;Balance/vestibular training;Discharge planning;Functional electrical stimulation;Pain management;Skin care/wound management;Therapeutic Activities;UE/LE Coordination activities;Cognitive remediation/compensation;Disease management/prevention;Splinting/orthotics;Functional  mobility training;Patient/family education;Therapeutic Exercise;Visual/perceptual remediation/compensation PT Transfers Anticipated Outcome(s): CGA using LRAD PT Locomotion Anticipated Outcome(s): CGA using LRAD for short household distances PT Recommendation Recommendations for Other Services: Neuropsych consult;Therapeutic Recreation consult Therapeutic Recreation Interventions: Stress management;Outing/community reintergration Follow Up Recommendations: Home health PT;24 hour supervision/assistance Patient destination: Home Equipment Recommended: To be determined   PT Evaluation Precautions/Restrictions Precautions Precautions: Fall;Cervical;Other (comment) Precaution Comments: hx of many falls, severe B LE hypertonia with muscle spasms Required Braces or Orthoses: Cervical Brace Cervical Brace: Hard collar (don in sitting, worn OOB) Restrictions Weight Bearing Restrictions Per Provider Order: No Pain Pain Assessment Pain Scale: 0-10 Pain Score: 7  Pain Type: Acute pain Pain Location: Neck Pain Orientation: Posterior Pain Descriptors / Indicators: Sore Pain Intervention(s): Medication (See eMAR);Rest Pain Interference Pain Interference Pain Effect on Sleep: 4. Almost constantly Pain Interference with Therapy Activities: 4. Almost constantly Pain Interference with Day-to-Day Activities: 4. 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 position  Lives With: Significant other (Leon Rodriguez, significant other) Prior Function Level of Independence: Requires assistive device for independence  Able to Take Stairs?: Yes (with assistance he bumps up on his bottom) Driving: No Vision/Perception  Vision - History Ability to See in Adequate Light: 0 Adequate Perception Perception: Within Functional Limits Praxis Praxis: WFL  Cognition Overall Cognitive Status: Within Functional Limits for tasks assessed Arousal/Alertness: Lethargic Orientation Level: Oriented X4 Year: 2024 Month: December Day of Week: Correct Attention: Focused;Sustained Focused Attention: Appears intact Sustained Attention: Impaired (due to lethargy) Safety/Judgment: Appears intact Sensation Sensation Light Touch: Impaired Detail Peripheral sensation comments: lacks LT sensation in B LEs Light Touch Impaired Details: Absent RLE;Absent LLE Hot/Cold: Not tested Proprioception: Impaired Detail Proprioception Impaired Details: Impaired LLE;Impaired RLE Stereognosis: Not tested Coordination Gross Motor Movements are Fluid and Coordinated: No Coordination and Movement Description: impaired due to B LE muscle spasms, weakness, and sensory deficits Motor   Motor Motor: Abnormal tone;Paraplegia;Other (comment);Abnormal postural alignment and control Motor - Skilled Clinical Observations: severe B LE hypertonia with muscle spasms, B LE weakness, and sensory deficits   Trunk/Postural Assessment  Cervical Assessment Cervical Assessment: Exceptions to [facility] (cervical collar) Thoracic Assessment Thoracic Assessment: Exceptions to [facility] (rounded shoulders) Lumbar Assessment Lumbar Assessment: Exceptions to [facility] (posterior pelvic tilt associated with B LE extensor tone) Postural Control Postural Control: Deficits on evaluation Trunk Control: impaired and stays in a posterior pelvic tilt position due to B LE extensor tone  Balance Balance Balance Assessed: Yes Static Sitting Balance Static Sitting - Balance Support: Feet supported;Bilateral upper extremity supported Static Sitting - Level of Assistance: 4: Min assist;3: Mod assist Dynamic Sitting Balance Dynamic Sitting - Balance Support: Feet supported Dynamic Sitting - Level of Assistance: 1: +1 Total assist Static Standing Balance Static Standing - Balance Support: During functional activity;Bilateral upper extremity supported Static Standing - Level  of Assistance: 1: +1 Total assist Dynamic Standing Balance Dynamic Standing - Balance Support: During functional activity;Bilateral upper extremity supported Dynamic Standing - Level of Assistance: 1: +2 Total assist Extremity Assessment      RLE Assessment RLE Assessment: Exceptions to [facility] Passive Range of Motion (PROM) Comments: lacks knee flexion ROM and DF ROM General Strength Comments: assessed in sitting RLE Strength Right Hip Flexion: 2+/5 Right Knee Flexion: 2+/5 Right Knee Extension: 3/5 Right Ankle Dorsiflexion: 3/5 Right Ankle Plantar Flexion: 4-/5 RLE Tone RLE Tone: Other (comment) RLE Tone Comments: has muscle spasms and increased tone in R LE causing his legs to scissor and adduct LLE Assessment LLE Assessment: Exceptions  to [facility] Passive Range of Motion (PROM) Comments: lacks knee flexion ROM, and ankle DF ROM General Strength Comments: assessed in sitting LLE Strength Left Hip Flexion: 2-/5 Left Knee Flexion: 2/5 Left Knee Extension: 3-/5 Left Ankle Dorsiflexion: 3-/5 Left Ankle Plantar Flexion: 3/5 LLE Tone LLE Tone Comments: spasms in LEs with adductor bias causing his legs to cross  Care Tool Care Tool Bed Mobility Roll left and right activity   Roll left and right assist level: Total Assistance - Patient < 25%    Sit to lying activity   Sit to lying assist level: Total Assistance - Patient < 25%    Lying to sitting on side of bed activity   Lying to sitting on side of bed assist level: the ability to move from lying on the back to sitting on the side of the bed with no back support.: Total Assistance - Patient < 25%     Care Tool Transfers Sit to stand transfer   Sit to stand assist level: 2 Helpers    Chair/bed transfer   Chair/bed transfer assist level: 2 Web designer transfer activity did not occur: Safety/medical concerns        Care Tool Locomotion Ambulation Ambulation activity did not occur: Safety/medical concerns        Walk 10 feet activity Walk 10 feet activity did not occur: Safety/medical concerns       Walk 50 feet with 2 turns activity Walk 50 feet with 2 turns activity did not occur: Safety/medical concerns      Walk 150 feet activity        Walk 10 feet on uneven surfaces activity Walk 10 feet on uneven surfaces activity did not occur: Safety/medical concerns      Stairs Stair activity did not occur: Safety/medical concerns        Walk up/down 1 step activity Walk up/down 1 step or curb (drop down) activity did not occur: Safety/medical concerns      Walk up/down 4 steps activity Walk up/down 4 steps activity did not occur: Safety/medical concerns      Walk up/down 12 steps activity Walk up/down 12 steps activity did not occur:  Safety/medical concerns      Pick up small objects from floor Pick up small object from the floor (from standing position) activity did not occur: Safety/medical concerns      Wheelchair Is the patient using a wheelchair?: No (was not safe to attempt transferring into w/c at this time, but yes will use w/c) Type of Wheelchair: Manual Wheelchair activity did not occur: Safety/medical concerns      Wheel 50 feet with 2 turns activity Wheelchair 50 feet with 2 turns activity did not occur: Safety/medical concerns    Wheel 150 feet activity Wheelchair 150 feet activity did  not occur: Safety/medical concerns      Refer to Care Plan for Long Term Goals  SHORT TERM GOAL WEEK 1 PT Short Term Goal 1 (Week 1): Pt will perform supine<>sit with max A of 1 consistently PT Short Term Goal 2 (Week 1): Pt will perform sit<>stands using LRAD with mod A of PT Short Term Goal 3 (Week 1): Pt will perform bed<>chair transfers using LRAD with mod A of 1 and +2 for safety as needed PT Short Term Goal 4 (Week 1): Pt will ambulate at least 51ft using LRAD with mod A of 1 and +2 as needed  Recommendations for other services: Neuropsych and Therapeutic Recreation  Stress management and Outing/community reintegration  Skilled Therapeutic Intervention Pt received sitting in recliner with his significant other present and agreeable to therapy session, despite reporting pt did not sleep at all last night. Pt severely lethargic and unable to stay awake fully during subjective hx requiring S.O. to ensure answers were accurate. Pt dozing off repeatedly requiring participation in mobility to stay awake. Evaluation completed (see details above) with patient education regarding purpose of PT evaluation, PT POC and goals, therapy schedule, weekly team meetings, and other CIR information including safety plan and fall risk safety. Pt performed the below functional mobility tasks with the specified levels of skilled cuing and  assistance. Pt received and remained with cervical collar in place throughout session.   Pt with severe hypertonia in B LEs with muscle spasms causing his legs to cross underneath him when trying to come into standing. Requires multiple attempts with total A and +2 retrieved for safety to come to standing with pt relying heavily on B UE support with limited weight placed through B LEs. Pt reports he feels like his legs are weaker today and his muscle spasms are worse.   Vitals supine in bed at end of session: BP 128/82 (MAP 95), HR 113bpm   Assisted with repositioning pt into R sidelying for rest and pain management with pillows positioned to support LEs. Pt left R sidelying in bed with needs in reach, bed alarm on, and his S.O. present - pt already falling asleep again.   Mobility Bed Mobility Bed Mobility: Supine to Sit;Sit to Supine Supine to Sit: Total Assistance - Patient < 25% Sit to Supine: Total Assistance - Patient < 25% Transfers Transfers: Sit to Stand;Stand to Sit;Stand Pivot Transfers Sit to Stand: Total Assistance - Patient < 25%;2 Helpers Stand to Sit: Total Assistance - Patient < 25%;2 Helpers Stand Pivot Transfers: Total Assistance - Patient < 25%;2 Helpers Stand Pivot Transfer Details: Manual facilitation for weight bearing;Manual facilitation for placement;Manual facilitation for weight shifting;Verbal cues for technique;Tactile cues for sequencing;Tactile cues for weight shifting;Tactile cues for weight beaing;Tactile cues for posture;Verbal cues for sequencing;Verbal cues for precautions/safety;Verbal cues for safe use of DME/AE Stand Pivot Transfer Details (indicate cue type and reason): +2 A needed for safety Transfer (Assistive device): Rolling walker Locomotion  Gait Ambulation: No Gait Gait: No Stairs / Additional Locomotion Stairs: No Wheelchair Mobility Wheelchair Mobility: No   Discharge Criteria: Patient will be discharged from PT if patient refuses  treatment 3 consecutive times without medical reason, if treatment goals not met, if there is a change in medical status, if patient makes no progress towards goals or if patient is discharged from hospital.  The above assessment, treatment plan, treatment alternatives and goals were discussed and mutually agreed upon: by patient and by family  Nicolus Ose Verdell Face , PT, DPT, NCS,  CSRS 06/08/2023, 10:21 AM

## 2023-06-09 DIAGNOSIS — G959 Disease of spinal cord, unspecified: Secondary | ICD-10-CM | POA: Diagnosis not present

## 2023-06-09 LAB — URINALYSIS, ROUTINE W REFLEX MICROSCOPIC
Bilirubin Urine: NEGATIVE
Glucose, UA: NEGATIVE mg/dL
Ketones, ur: NEGATIVE mg/dL
Leukocytes,Ua: NEGATIVE
Nitrite: NEGATIVE
Protein, ur: NEGATIVE mg/dL
RBC / HPF: >50 RBC/hpf (ref 0–5)
Specific Gravity, Urine: 1.008 (ref 1.005–1.030)
pH: 6 (ref 5.0–8.0)

## 2023-06-09 MED ORDER — SENNOSIDES-DOCUSATE SODIUM 8.6-50 MG PO TABS
2.0000 | ORAL_TABLET | Freq: Two times a day (BID) | ORAL | Status: DC
  Administered 2023-06-09 – 2023-06-10 (×3): 2 via ORAL
  Filled 2023-06-09 (×3): qty 2

## 2023-06-09 MED ORDER — LIDOCAINE HCL URETHRAL/MUCOSAL 2 % EX GEL
1.0000 | Freq: Four times a day (QID) | CUTANEOUS | Status: DC | PRN
Start: 2023-06-09 — End: 2023-06-13
  Administered 2023-06-10 – 2023-06-13 (×7): 1 via URETHRAL
  Filled 2023-06-09 (×3): qty 6
  Filled 2023-06-09: qty 12
  Filled 2023-06-09 (×7): qty 6

## 2023-06-09 MED ORDER — SENNOSIDES-DOCUSATE SODIUM 8.6-50 MG PO TABS: 2.0000 | ORAL_TABLET | Freq: Two times a day (BID) | ORAL | Status: DC

## 2023-06-09 MED ORDER — MENTHOL 3 MG MT LOZG: 1.0000 | LOZENGE | OROMUCOSAL | Status: DC | PRN

## 2023-06-09 MED ORDER — GUAIFENESIN ER 600 MG PO TB12
600.0000 mg | ORAL_TABLET | Freq: Two times a day (BID) | ORAL | Status: DC
  Administered 2023-06-09 – 2023-06-13 (×10): 600 mg via ORAL
  Filled 2023-06-09 (×10): qty 1

## 2023-06-09 MED ORDER — BISACODYL 10 MG RE SUPP
10.0000 mg | Freq: Once | RECTAL | Status: DC
  Filled 2023-06-09: qty 1

## 2023-06-09 MED ORDER — SORBITOL 70 % SOLN
30.0000 mL | Freq: Once | Status: AC
  Administered 2023-06-09: 30 mL via ORAL
  Filled 2023-06-09: qty 30

## 2023-06-09 NOTE — Progress Notes (Signed)
Bladder Scan 364 ml.  In and out cath 500 ml, clear, amber urine.  No blood clots.

## 2023-06-09 NOTE — Progress Notes (Signed)
Physical Therapy Session Note  Patient Details  Name: Leon Rodriguez MRN: [id number] Date of Birth: [birth year]  Today's Date: 06/09/2023 PT Individual Time: 1345-1440 PT Individual Time Calculation (min): 55 min   Short Term Goals: Week 1:  PT Short Term Goal 1 (Week 1): Pt will perform supine<>sit with max A of 1 consistently PT Short Term Goal 2 (Week 1): Pt will perform sit<>stands using LRAD with mod A of PT Short Term Goal 3 (Week 1): Pt will perform bed<>chair transfers using LRAD with mod A of 1 and +2 for safety as needed PT Short Term Goal 4 (Week 1): Pt will ambulate at least 84ft using LRAD with mod A of 1 and +2 as needed  Skilled Therapeutic Interventions/Progress Updates:    Chart reviewed and pt agreeable to therapy. Pt received sitting EOB  with c/o pain in B anterior hips that was not quantified. Also of note, pt found with family attempting to set up and assist patient with transfer to chair using RW. Pt currently noted to use maximove for dependent transfers out of bed. Family provided extensive education on fall prevention, with family verbalizing understanding and agreement of importance to call for staff to assist any transfers. Session focused on NMR and functional transfers to promote functional recovery for household mobility. Pt assisted back to supine position using modA +2. Pt then completed AAROM of hip add/abd in assisted unilateral hook-lying position. Pt noted to have strong extensor tone that partially reduce with continued mobility. Pt challenged to maintain hook lying position, requiring tactile cues to keep heel on bed surface. Pt then assisted maximove sling positioning by rolling R/L using maxA. Once placed in recliner, pt completed B LAQ with tactile assist to reduce adductor tone. Pt challenged by drowsiness t/o session. Pt able to reduce adductor tone when fully awake and attentive to exercise. Pt then completed semi-reclined, unilateral leg press with  active assist to raise leg, and moderate resistance during leg press. Pt again able to reduce adductor tone with increased attention and awareness. Session education emphasized fall prevention. At end of session, pt was left seated in recliner with legs elevated, with alarm engaged, nurse call bell and all needs in reach.     Therapy Documentation Precautions:  Precautions Precautions: Fall, Cervical, Other (comment) Precaution Comments: hx of many falls, severe B LE hypertonia with muscle spasms Required Braces or Orthoses: Cervical Brace Cervical Brace: Hard collar (to be worn out of bed) Restrictions Weight Bearing Restrictions Per Provider Order: No General:      Therapy/Group: Individual Therapy  Dionne Milo, PT, DPT 06/09/2023, 2:57 PM

## 2023-06-09 NOTE — Progress Notes (Signed)
Post void bladder scan result 434 ml.  In and out cath 500 ml, clear, amber urine,  One very small, isolated blood clot. No difficulty with the In and out cath, and no trauma to the meatus.  Patient tolerated well.

## 2023-06-09 NOTE — Plan of Care (Signed)
  Problem: RH Balance Goal: LTG Patient will maintain dynamic standing with ADLs (OT) Description: LTG:  Patient will maintain dynamic standing balance with assist during activities of daily living (OT)  Flowsheets (Taken 06/09/2023 1820) LTG: Pt will maintain dynamic standing balance during ADLs with: (goal down graded based on discussion with team) Contact Guard/Touching assist Note: goal down graded based on discussion with team    Problem: RH Dressing Goal: LTG Patient will perform lower body dressing w/assist (OT) Description: LTG: Patient will perform lower body dressing with assist, with/without cues in positioning using equipment (OT) Flowsheets (Taken 06/09/2023 1820) LTG: Pt will perform lower body dressing with assistance level of: (goal down graded based on discussion with team) Minimal Assistance - Patient > 75% Note: goal down graded based on discussion with team    Problem: RH Tub/Shower Transfers Goal: LTG Patient will perform tub/shower transfers w/assist (OT) Description: LTG: Patient will perform tub/shower transfers with assist, with/without cues using equipment (OT) Flowsheets (Taken 06/09/2023 1820) LTG: Pt will perform tub/shower stall transfers with assistance level of: (goal down graded based on discussion with team) Minimal Assistance - Patient > 75% Note: goal down graded based on discussion with team

## 2023-06-09 NOTE — Plan of Care (Signed)
  Problem: Consults Goal: RH GENERAL PATIENT EDUCATION Description: See Patient Education module for education specifics. Outcome: Progressing   Problem: RH BOWEL ELIMINATION Goal: RH STG MANAGE BOWEL WITH ASSISTANCE Description: STG Manage Bowel with toileting Assistance. Outcome: Progressing Goal: RH STG MANAGE BOWEL W/MEDICATION W/ASSISTANCE Description: STG Manage Bowel with Medication with mod I Assistance. Outcome: Progressing   Problem: RH SAFETY Goal: RH STG ADHERE TO SAFETY PRECAUTIONS W/ASSISTANCE/DEVICE Description: STG Adhere to Safety Precautions With cues Assistance/Device. Outcome: Progressing   Problem: RH PAIN MANAGEMENT Goal: RH STG PAIN MANAGED AT OR BELOW PT'S PAIN GOAL Description: < 4 with prns Outcome: Progressing   Problem: RH KNOWLEDGE DEFICIT GENERAL Goal: RH STG INCREASE KNOWLEDGE OF SELF CARE AFTER HOSPITALIZATION Description: Patient and wife will be able to manage care using educational resources for diet and medication, precautions independently Outcome: Progressing

## 2023-06-09 NOTE — Plan of Care (Signed)
  Problem: RH PAIN MANAGEMENT Goal: RH STG PAIN MANAGED AT OR BELOW PT'S PAIN GOAL Description: < 4 with prns Outcome: Progressing   Problem: RH SKIN INTEGRITY Goal: RH STG SKIN FREE OF INFECTION/BREAKDOWN Outcome: Progressing   Problem: RH PAIN MANAGEMENT Goal: RH STG PAIN MANAGED AT OR BELOW PT'S PAIN GOAL Outcome: Progressing

## 2023-06-09 NOTE — Progress Notes (Addendum)
PROGRESS NOTE   Subjective/Complaints:  LBM 3 days ago- due to pain meds as well as spasticity meds, can cause severe constipation in setting of neurogenic bowel.  Wil try Sorbitol and suppository and increase meds.   Concerned about some scant drainage from penis- bloody drainage- as well as a clot in uine- Hematuria- which is reasonable to be concerned about.   Will hold lovenox per family's concerns  Also spasms somewhat better- not like they were.  Neck hurting him pretty bad this AM.   Coughing started last night- with sore throat- doesn't like throat spray- will order cough drops prn and mucinex.  .   ROS:   Pt denies SOB, abd pain, CP, N/V/C/D, and vision changes   Negative except for HPI  Objective:   VAS [place] LOWER EXTREMITY VENOUS (DVT) Result Date: 06/08/2023  Lower Venous DVT Study Patient Name:  Leon Rodriguez  Date of Exam:   06/07/2023 Medical Rec #: [id number]         Accession #:    [id number] Date of Birth: 1966/03/30         Patient Gender: M Patient Age:   57 years Exam Location:  [facility] Procedure:      VAS [place] LOWER EXTREMITY VENOUS (DVT) Referring Phys: Huntley Dec TOMLINSON --------------------------------------------------------------------------------  Indications: Pain.  Risk Factors: None identified. Limitations: Poor ultrasound/tissue interface and patient immobility, patient positioning, patient pain tolerance, leg spasms. Comparison Study: No prior studies. Performing Technologist: Chanda Busing RVT  Examination Guidelines: A complete evaluation includes B-mode imaging, spectral Doppler, color Doppler, and power Doppler as needed of all accessible portions of each vessel. Bilateral testing is considered an integral part of a complete examination. Limited examinations for reoccurring indications may be performed as noted. The reflux portion of the exam is performed with the patient in reverse  Trendelenburg.  +---------+---------------+---------+-----------+----------+-------------------+ RIGHT    CompressibilityPhasicitySpontaneityPropertiesThrombus Aging      +---------+---------------+---------+-----------+----------+-------------------+ CFV      Full           Yes      Yes                                      +---------+---------------+---------+-----------+----------+-------------------+ SFJ      Full                                                             +---------+---------------+---------+-----------+----------+-------------------+ FV Prox                 Yes      Yes                                      +---------+---------------+---------+-----------+----------+-------------------+ FV Mid                  Yes  Yes                                      +---------+---------------+---------+-----------+----------+-------------------+ FV Distal               Yes      Yes                                      +---------+---------------+---------+-----------+----------+-------------------+ PFV                                                   Not well visualized +---------+---------------+---------+-----------+----------+-------------------+ POP      Full           Yes      Yes                                      +---------+---------------+---------+-----------+----------+-------------------+ PTV      Full                                                             +---------+---------------+---------+-----------+----------+-------------------+ PERO     Full                                                             +---------+---------------+---------+-----------+----------+-------------------+   +---------+---------------+---------+-----------+----------+-------------------+ LEFT     CompressibilityPhasicitySpontaneityPropertiesThrombus Aging       +---------+---------------+---------+-----------+----------+-------------------+ CFV      Full           Yes      Yes                                      +---------+---------------+---------+-----------+----------+-------------------+ SFJ      Full                                                             +---------+---------------+---------+-----------+----------+-------------------+ FV Prox  Full                                                             +---------+---------------+---------+-----------+----------+-------------------+ FV Mid                  Yes      Yes                                      +---------+---------------+---------+-----------+----------+-------------------+  FV Distal               Yes      Yes                                      +---------+---------------+---------+-----------+----------+-------------------+ PFV                                                   Not well visualized +---------+---------------+---------+-----------+----------+-------------------+ POP      Full           Yes      Yes                                      +---------+---------------+---------+-----------+----------+-------------------+ PTV      Full                                                             +---------+---------------+---------+-----------+----------+-------------------+ PERO     Full                                                             +---------+---------------+---------+-----------+----------+-------------------+     Summary: RIGHT: - There is no evidence of deep vein thrombosis in the lower extremity. However, portions of this examination were limited- see technologist comments above.  - No cystic structure found in the popliteal fossa.  LEFT: - There is no evidence of deep vein thrombosis in the lower extremity. However, portions of this examination were limited- see technologist comments above.  - No cystic  structure found in the popliteal fossa.  *See table(s) above for measurements and observations. Electronically signed by Heath Lark on 06/08/2023 at 12:35:09 PM.    Final    Recent Labs    06/08/23 0539  WBC 9.0  HGB 12.2*  HCT 33.6*  PLT 276   Recent Labs    06/08/23 0539  NA 137  K 3.9  CL 103  CO2 26  GLUCOSE 109*  BUN 7  CREATININE 0.71  CALCIUM 9.5    Intake/Output Summary (Last 24 hours) at 06/09/2023 1028 Last data filed at 06/09/2023 0900 Gross per 24 hour  Intake 237 ml  Output 220 ml  Net 17 ml        Physical Exam: Vital Signs Blood pressure (!) 124/90, pulse 98, temperature (!) 97.5 F (36.4 C), resp. rate 20, height 5\' 9"  (1.753 m), weight 93 kg, SpO2 100%.    General: awake, alert, appropriate,  sitting up in bed; wife at bedside; NAD HENT: conjugate gaze; oropharynx moist CV: regular to mildly tachycardic  rate regular rhythm; no JVD Pulmonary: has a cough- not deep/not productive- upper airway sound son exam- no W/R/R- adequate air movement GI: soft, NT, slightly distended vs protuberant and hypoactive BS Psychiatric: appropriate- less pain complicating  affect Neurological: Ox3 MAS of 3 still, but fewer spasms- still has extensor tone with movement, but less so Can elicit sustained clonus B/L but also sets off extensor tone- lasted almost 2 minutes Neurological:     Mental Status: He is alert.     Comments: Alert and oriented x 3. Normal insight and awareness. Intact Memory. Normal language and speech. Cranial nerve exam unremarkable. MMT: RUE 5/5. LUE 5/5 deltoid, biceps, 4+ triceps, wrist extensors, hand intrinsics. Pt appears to be able to move all muscle both lower extremities but MMT inhibited by spasticity. Pt with decreased LT in finger tips and from inguinal region down. Indicated that LT was intact along trunk during sensory testing. Pt with hyperreflexia in LE's 3+. MAS is 3 to 4/4 in BLE's, particularly in quads, plantarflexors, and hip  adductors. Tone was slightly improved from yesterday when I examined him.     Assessment/Plan: 1. Functional deficits which require 3+ hours per day of interdisciplinary therapy in a comprehensive inpatient rehab setting. Physiatrist is providing close team supervision and 24 hour management of active medical problems listed below. Physiatrist and rehab team continue to assess barriers to discharge/monitor patient progress toward functional and medical goals  Care Tool:  Bathing    Body parts bathed by patient: Right arm, Left arm, Chest, Abdomen, Front perineal area, Right upper leg, Left upper leg, Face   Body parts bathed by helper: Left lower leg, Right lower leg, Buttocks     Bathing assist Assist Level: Moderate Assistance - Patient 50 - 74% (based on simulated task and his inability to demonstrate come from sit to stand for LB task performance.)     Upper Body Dressing/Undressing Upper body dressing   What is the patient wearing?: Pull over shirt    Upper body assist Assist Level: Set up assist (using modified technique)    Lower Body Dressing/Undressing Lower body dressing      What is the patient wearing?: Pants, Incontinence brief     Lower body assist Assist for lower body dressing: Total Assistance - Patient < 25%     Toileting Toileting    Toileting assist Assist for toileting: Total Assistance - Patient < 25%     Transfers Chair/bed transfer  Transfers assist     Chair/bed transfer assist level: 2 Helpers     Locomotion Ambulation   Ambulation assist   Ambulation activity did not occur: Safety/medical concerns          Walk 10 feet activity   Assist  Walk 10 feet activity did not occur: Safety/medical concerns        Walk 50 feet activity   Assist Walk 50 feet with 2 turns activity did not occur: Safety/medical concerns         Walk 150 feet activity   Assist           Walk 10 feet on uneven surface   activity   Assist Walk 10 feet on uneven surfaces activity did not occur: Safety/medical concerns         Wheelchair     Assist Is the patient using a wheelchair?: No (was not safe to attempt transferring into w/c at this time, but yes will use w/c) Type of Wheelchair: Manual Wheelchair activity did not occur: Safety/medical concerns         Wheelchair 50 feet with 2 turns activity    Assist    Wheelchair 50 feet with 2 turns activity did not occur: Safety/medical concerns  Wheelchair 150 feet activity     Assist  Wheelchair 150 feet activity did not occur: Safety/medical concerns       Blood pressure (!) 124/90, pulse 98, temperature (!) 97.5 F (36.4 C), resp. rate 20, height 5\' 9"  (1.753 m), weight 93 kg, SpO2 100%.  Medical Problem List and Plan: 1. Functional deficits secondary to chronic C6-T1 cervical myelopathy s/p ACDF 06/05/2023             -patient may shower with collar on. Will order replacement pads             -ELOS/Goals: 18-24 days, goals are mod I to supervision with self-care and mobility   Admit to CIR- first day of evaluations- difficulty tolerating due to lack of sleep as well severe spasticity  - needed significant titration of spasticity agents  Con't CIR PT, OT  2.  Antithrombotics: -DVT/anticoagulation:  Pharmaceutical: 12/15- Lovenox 2x/day  -but held today due to hematuria- Dopplers negative             -antiplatelet therapy: none   3. Pain Management: Tylenol as needed;   Norco 5-10 mg q 4 hours prn             - see spasticity discussion below  12/15- c/o neck pain- not sure if meds enough- if doesn't improve by tomorrow, can increase to Oxycodone if need be.  4. Mood/Behavior/Sleep: LCSW to evaluate and provide emotional support             -antipsychotic agents: n/a   5. Neuropsych/cognition: This patient is capable of making decisions on his own behalf.   6. Skin/Wound Care: Routine skin care checks   7.  Fluids/Electrolytes/Nutrition: Routine Is and Os and follow-up chemistries   8. Significant LE spasticity:             -increased baclofen to 20mg  q8 hours today. Pt states that it's helping but that spasticity increases over the hour or so prior to the subsequent dose. Consider increasing to q6 hours over weekend BP/SE permitting             -valium 5mg  q6 prn             -rom, activity with PT  12/14- spasticity still severe- no improvement with "robaxin, flexeril"- will change Valium to 5 mg q6 Hours- and see if can tolerate- since more spasms, than tone- and added Zanaflex 4 mg q6 hours prn- for more spasms.   12/15- Spasticity is somewhat better- MAS still 3, but spasms are more tolerable per pt- wait ot increase baclofen for now.  9. Neurogenic bowel:             -he is continent             -last bm yesterday             -senna-s 2 at bedtime, observe for pattern  12/14- LBM 2 days ago- likely constipation in setting of neurogenic bowel- if no BM by tomorrow, will intervene  12/15- Ordered Sorbitol for bowels- 30cc after therapy- also suppository if no BM. Increased Senna to 2 tabs BID from daily since not having regular BM's  10. Neurogenic bladder:             -pt with frequency, generally continent. He does have some incontinence at night.             -unsure of PVR's.             -  begin timed voids while awake             -check PVR's  12/14- ordered bladder scans- q6 hours- to make sure no problems with retention- will order for 3 days-  12/15- only Bladder scan is 113cc- so retaining some. Will con't to follow  11. Hematuria  12/15- blood tinged urine and bleeding from meatus- will check U/A and Cx- told nursing to collect urine ASAP and let me know when back- of note, no trauma per pt/wife, no caths.  12. Cough  12/15- sounds like upper airway sounds on exam- will add Mucinex 600 mg BID and cough drops prn.    Addendum- pt voided 250cc and PVR showed 434 cc still in bladder-  asked staff to cath him and added Bladder in/out cath orders q6 hours prn- with lidocaine jelly-  Will see what U/A shows, but I think he' likely has UTI causing more retention  I spent a total of 56   minutes on total care today- >50% coordination of care- due to  D/w team, charge nurse, pt, wife and nursing x3 about hematuria, cough; and bleeding from meatus of penis- no trauma      LOS: 2 days A FACE TO FACE EVALUATION WAS PERFORMED  Janesa Dockery 06/09/2023, 10:28 AM

## 2023-06-10 DIAGNOSIS — G959 Disease of spinal cord, unspecified: Secondary | ICD-10-CM | POA: Diagnosis not present

## 2023-06-10 DIAGNOSIS — R252 Cramp and spasm: Secondary | ICD-10-CM | POA: Diagnosis not present

## 2023-06-10 DIAGNOSIS — N319 Neuromuscular dysfunction of bladder, unspecified: Secondary | ICD-10-CM | POA: Diagnosis not present

## 2023-06-10 DIAGNOSIS — K592 Neurogenic bowel, not elsewhere classified: Secondary | ICD-10-CM | POA: Diagnosis not present

## 2023-06-10 LAB — BASIC METABOLIC PANEL
Anion gap: 6 (ref 5–15)
BUN: 7 mg/dL (ref 6–20)
CO2: 27 mmol/L (ref 22–32)
Calcium: 9.3 mg/dL (ref 8.9–10.3)
Chloride: 104 mmol/L (ref 98–111)
Creatinine, Ser: 0.56 mg/dL — ABNORMAL LOW (ref 0.61–1.24)
GFR, Estimated: >60 mL/min (ref >60)
Glucose, Bld: 103 mg/dL — ABNORMAL HIGH (ref 70–99)
Potassium: 3.9 mmol/L (ref 3.5–5.1)
Sodium: 137 mmol/L (ref 135–145)

## 2023-06-10 LAB — URINE CULTURE: Culture: NO GROWTH

## 2023-06-10 LAB — CBC
HCT: 30.4 % — ABNORMAL LOW (ref 39.0–52.0)
Hemoglobin: 11.1 g/dL — ABNORMAL LOW (ref 13.0–17.0)
MCH: 34.8 pg — ABNORMAL HIGH (ref 26.0–34.0)
MCHC: 36.5 g/dL — ABNORMAL HIGH (ref 30.0–36.0)
MCV: 95.3 fL (ref 80.0–100.0)
Platelets: 286 10*3/uL (ref 150–400)
RBC: 3.19 MIL/uL — ABNORMAL LOW (ref 4.22–5.81)
RDW: 12.5 % (ref 11.5–15.5)
WBC: 7.6 10*3/uL (ref 4.0–10.5)
nRBC: 0 % (ref 0.0–0.2)

## 2023-06-10 MED ORDER — SENNOSIDES-DOCUSATE SODIUM 8.6-50 MG PO TABS
2.0000 | ORAL_TABLET | Freq: Two times a day (BID) | ORAL | Status: DC
Start: 2023-06-10 — End: 2023-06-13
  Administered 2023-06-11 – 2023-06-13 (×5): 2 via ORAL
  Filled 2023-06-10 (×7): qty 2

## 2023-06-10 MED ORDER — SORBITOL 70 % SOLN
60.0000 mL | Status: AC
  Administered 2023-06-10: 60 mL via ORAL
  Filled 2023-06-10: qty 60

## 2023-06-10 MED ORDER — TAMSULOSIN HCL 0.4 MG PO CAPS
0.4000 mg | ORAL_CAPSULE | Freq: Every day | ORAL | Status: DC
  Administered 2023-06-10 – 2023-06-13 (×4): 0.4 mg via ORAL
  Filled 2023-06-10 (×4): qty 1

## 2023-06-10 MED ORDER — SENNOSIDES-DOCUSATE SODIUM 8.6-50 MG PO TABS: 3.0000 | ORAL_TABLET | Freq: Every day | ORAL | Status: DC

## 2023-06-10 NOTE — IPOC Note (Addendum)
Overall Plan of Care [facility]) Patient Details Name: Leon Rodriguez MRN: [id number] DOB: 10-03-1965  Admitting Diagnosis: Cervical myelopathy [facility])  Hospital Problems: Principal Problem:   Cervical myelopathy (HCC)     Functional Problem List: Nursing Pain, Bowel, Safety, Endurance, Medication Management  PT Balance, Behavior, Safety, Sensory, Edema, Skin Integrity, Endurance, Motor, Pain, Nutrition  OT Endurance, Pain, Balance  SLP    TR         Basic ADL's: OT  Bathing, dressing, toileting, toilet transfer and showering      Advanced  ADL's: OT       Transfers: PT Bed Mobility, Bed to Chair, Car, Furniture, Floor  OT Toilet, Research scientist (life sciences): PT Ambulation, Psychologist, prison and probation services, Stairs     Additional Impairments: OT    SLP        TR      Anticipated Outcomes Item Anticipated Outcome  Self Feeding ModI  Swallowing      Basic self-care  ModI  Toileting  ModI   Bathroom Transfers Modi  Bowel/Bladder  manage bowel w mod I assist  Transfers  CGA using LRAD  Locomotion  CGA using LRAD for short household distances  Communication     Cognition     Pain  < 4 with prns  Safety/Judgment  manage w cues   Therapy Plan: PT Intensity: Minimum of 1-2 x/day ,45 to 90 minutes PT Frequency: 5 out of 7 days PT Duration Estimated Length of Stay: ~3.5 weeks OT Frequency: Total of 15 hours over 7 days of combined therapies, 5 out of 7 days OT Duration/Estimated Length of Stay: 3.5 wks OT intensity : min of 1-2 x day; 45 to 90 min      Team Interventions: Nursing Interventions Discharge Planning, Disease Management/Prevention, Patient/Family Education, Bowel Management, Medication Management, Pain Management  PT interventions Ambulation/gait training, Community reintegration, DME/adaptive equipment instruction, Neuromuscular re-education, Museum/gallery curator, Psychosocial support, UE/LE Strength taining/ROM, Wheelchair propulsion/positioning,  Warden/ranger, Discharge planning, Functional electrical stimulation, Pain management, Skin care/wound management, Therapeutic Activities, UE/LE Coordination activities, Cognitive remediation/compensation, Disease management/prevention, Splinting/orthotics, Functional mobility training, Patient/family education, Therapeutic Exercise, Visual/perceptual remediation/compensation  OT Interventions DME/adaptive equipment instruction, Therapeutic Activities, UE/LE Strength taining/ROM, Therapeutic Exercise, Self Care/advanced ADL retraining, Warden/ranger, UE/LE Coordination activities, Patient/family education  SLP Interventions    TR Interventions    SW/CM Interventions Discharge Planning, Psychosocial Support, Patient/Family Education   Barriers to Discharge MD  Medical stability, Home enviroment access/loayout, Incontinence, Lack of/limited family support, and Insurance for SNF coverage  Nursing Decreased caregiver support, Home environment access/layout 2 level, 2 ste, 1/2 ba on main; B+B up 16 stepspt states he stays on the second floor and only goes downstairs for appts, had a fall on stairs a couple of months ago. pt has been walking short distances with RW, and states he has mostly been hopping and using RW like crutches to maintain bilat LE NWB during swing phase of gait. pt reports 4 falls in the past 6 months  ADLs Comments: wife does dressing and LB washup, has been getting in and out of tub but has had a fall there  PT Home environment access/layout, Inaccessible home environment, Decreased caregiver support, Lack of/limited family support, Incontinence    OT      SLP      SW Decreased caregiver support, Lack of/limited family support, Insurance for SNF coverage, Other (comments) s/o returning to work in Jan 2025   Team Discharge Planning: Destination: PT-Home ,OT- Home , SLP-  Projected Follow-up: PT-Home health PT, 24 hour supervision/assistance, OT-  Home  health OT (based on functional status at the time of d/c), SLP-  Projected Equipment Needs: PT-To be determined, OT- Rolling walker with 5" wheels, Tub/shower bench, 3 in 1 bedside comode, SLP-  Equipment Details: PT- , OT-  Patient/family involved in discharge planning: PT- Patient, Family member/caregiver,  OT-Patient, Family member/caregiver, SLP-   MD ELOS: 18-24 days Medical Rehab Prognosis:  Good Assessment: The patient has been admitted for CIR therapies with the diagnosis of chronic C6-T1 cervical myelopathy s/p ACDF 06/05/2023 . The team will be addressing functional mobility, strength, stamina, balance, safety, adaptive techniques and equipment, self-care, bowel and bladder mgt, patient and caregiver education, . Goals have been set at Supervision . Anticipated discharge destination is home.       See Team Conference Notes for weekly updates to the plan of care

## 2023-06-10 NOTE — Op Note (Addendum)
 Providing Compassionate, Quality Care - Together  Date of service: 06/05/2023  PREOP DIAGNOSIS: Cervical stenosis with myelopathy C6-T1  POSTOP DIAGNOSIS: Same  PROCEDURE: 1. Arthrodesis C5-6-7, C7-T1, anterior interbody technique  2. Placement of intervertebral biomechanical device C6-7, C7-T1: Medtronic Titan titanium interbody C6-7: 7 x 14 mm, C7-T1 7 x 14 mm 3. Placement of anterior instrumentation consisting of interbody plate and screws -C6/T1: Medtronic Zevo 39 mm plate, 18 mm screws bilaterally at C6, C7, 16 mm screws bilaterally at T1. 4. Discectomy at C6-7, C7-T1 for decompression of spinal cord and exiting nerve roots  5. Use of morselized bone allograft  6. Use of intraoperative microscope 7.  Use of autograft, same incision  SURGEON: Dr. Monia Pouch, DO  ASSISTANT: Dr. Coletta Memos, MD; [name], PA  ANESTHESIA: General Endotracheal  EBL: 25 cc  SPECIMENS: None  DRAINS: Medium Hemovac  COMPLICATIONS: None immediate  CONDITION: Hemodynamically stable to PACU  HISTORY: Leon Rodriguez is a 57 y.o. y.o. male who initially presented to the outpatient clinic with signs and symptoms consistent with myelopathy.  He had been complaining of progressively lower extremity weakness, spasticity and difficulty walking.  This had progressed to the point of requiring a wheelchair. MRI demonstrated of the cervical spine revealed multilevel disc osteophyte complex most notably at C6-7 and C7-T1 with cord compression, with cord signal change at C7-T1.  Given his progressive symptoms and severe stenosis and cord signal change, I recommended surgical intervention in the form of an ACDF C6-T1. Treatment options were discussed including pain control, physical therapy or surgical intervention.  All risks, including infection, bleeding, need for more surgery, recurrent laryngeal injury, esophageal injury, CSF leak, further weakness numbness tingling or need for reoperation; benefits  and expected outcomes were discussed and agreed upon. After all questions were answered, informed consent was obtained.  PROCEDURE IN DETAIL: The patient was brought to the operating room and transferred to the operative table. After induction of general anesthesia, the patient was positioned on the operative table in the supine position with all pressure points meticulously padded. The skin of the neck was then prepped and draped in the usual sterile fashion.  Physician driven timeout was performed.  After timeout was conducted, left sided skin incision was then made sharply with a 10 blade and Bovie electrocautery was used to dissect the subcutaneous tissue until the platysma was identified. The platysma was then divided and undermined. The sternocleidomastoid muscle was then identified and, utilizing natural fascial planes in the neck, the prevertebral fascia was identified and the carotid sheath was retracted laterally and the trachea and esophagus retracted medially. Again using fluoroscopy, the correct disc space was identified. Bovie electrocautery was used to dissect in the subperiosteal plane and elevate the bilateral longus coli muscles. Self-retaining retractors were then placed under the longus coli muscles bilaterally. At this point, the microscope was draped and brought into the field, and the remainder of the case was done under the microscope using microdissecting technique.  Very large anterior osteophyte was removed from C6-7 and saved for autograft for later use.  ACDF C6-7: Distraction pins were placed in midline above and below the disc space.  The disc  space was placed in distraction.  The disc space was incised sharply and rongeurs were use to initially complete a discectomy. The high-speed drill was then used to complete discectomy until the posterior annulus was identified and removed and the posterior longitudinal ligament was identified. Using microcurettes, the PLL was elevated,  and  Kerrison rongeurs were used to remove the posterior longitudinal ligament and the ventral thecal sac was identified. Using a combination of curettes and ronguers, complete decompression of the thecal sac and exiting nerve roots at this level was completed, and verified using micro-nerve hook. The disc space was taken out of distraction.  Epidural hemostasis was achieved with Surgifoam.  Having completed our decompression, attention was turned to placement of the intervertebral device. Trial spacers were used to select a 7 mm graft. This graft was then filled with autograft and morcellized allograft, and inserted under live fluoroscopy.  ACDF C7-T1: Distraction pins were placed in midline above and below the disc space.  The disc  space was placed in distraction.  The disc space was incised sharply and rongeurs were use to initially complete a discectomy. The high-speed drill was then used to complete discectomy until the posterior annulus was identified and removed and the posterior longitudinal ligament was identified. Using microcurettes, the PLL was elevated, and Kerrison rongeurs were used to remove the posterior longitudinal ligament and the ventral thecal sac was identified. Using a combination of curettes and ronguers, complete decompression of the thecal sac and exiting nerve roots at this level was completed, and verified using micro-nerve hook. The disc space was taken out of distraction.  Epidural hemostasis was achieved with Surgifoam.  Having completed our decompression, attention was turned to placement of the intervertebral device. Trial spacers were used to select a 7 mm graft. This graft was then filled with autograft and morcellized allograft, and inserted under live fluoroscopy.  After placement of the intervertebral device, the above anterior cervical plate was selected, and placed across the interspaces. Using a high-speed drill, the cortex of the cervical vertebral bodies was  punctured, and screws inserted in C6, C7, T1. Final fluoroscopic images in AP and lateral projections were taken to confirm good hardware placement.  The plate was final tightened to the manufacturer's recommendation and the screws were locked in place.  At this point, after all counts were verified to be correct, meticulous hemostasis was secured using a combination of bipolar electrocautery and passive hemostatics.  Medium Hemovac was tunneled laterally and placed in the prevertebral space.  Skin was closed with staples.  Sterile dressing was applied.  The patient tolerated the procedure well and was extubated in the room and taken to the postanesthesia care unit in stable condition.

## 2023-06-10 NOTE — Progress Notes (Signed)
Occupational Therapy Session Note  Patient Details  Name: Leon Rodriguez MRN: [id number] Date of Birth: June 17, 1966  Today's Date: 06/10/2023 OT Individual Time: 0930-1030 OT Individual Time Calculation (min): 60 min  and Today's Date: 06/10/2023 OT Missed Time: 15 Minutes Missed Time Reason: Other (comment);Pain (increased LB tone and pain)    Skilled Therapeutic Interventions/Progress Updates:    Pt resting in w/c upon arrival with SO present. Initial focus on discharge planning and review of goals. Pt with significant LB/BLE tone requiring manual therapy to assist with LLE knee extension. Pt reports worsening symptoms but medications make him drowsy so he had "held off" until after therapy. Sit<>stand in [place] with max A+2 and gait belt used to prevent BLE knee flexion. Tot A+2 for complete upright posture for paddle positioning. Sit>supine with tot A+2. Pt with increased hip, BLE adduction/flexion during transfer, requiring +2 to place rolled blankets for positioning. Meds requested from RN. Pt remained in bed with SO present. All needs within reach. Pt missed 15 mins skilled OT services. Will attempt to see again as schedule allows.   Therapy Documentation Precautions:  Precautions Precautions: Fall, Cervical, Other (comment) Precaution Comments: hx of many falls, severe B LE hypertonia with muscle spasms Required Braces or Orthoses: Cervical Brace Cervical Brace: Hard collar (to be worn out of bed) Restrictions Weight Bearing Restrictions Per Provider Order: No General: General OT Amount of Missed Time: 15 Minutes   Pain: Pain Assessment Pain Scale: 0-10 Pain Score: 5    Therapy/Group: Individual Therapy  Rich Brave 06/10/2023, 10:41 AM

## 2023-06-10 NOTE — Progress Notes (Signed)
Occupational Therapy Note  Patient Details  Name: Leon Rodriguez MRN: [id number] Date of Birth: 08/17/1965  Today's Date: 06/10/2023 OT Missed Time: 60 Minutes Missed Time Reason: Patient fatigue (pt sleeping)  Pt sleeping upon arrival. Unable to keep eyes open and SO states he didn't awaken for lunch. Meds admin earlier and pt unable to actively participate. Will check back as time allows. Pt missed 60 mins skilled OT services.   Lavone Neri [facility] 06/10/2023, 1:49 PM

## 2023-06-10 NOTE — Progress Notes (Addendum)
Patient ID: Leon Rodriguez, male   DOB: 1965-08-15, 57 y.o.   MRN: [id number]  1034- SW spoke with pt s/o [name] to introduce self, explain role, discuss discharge process, and inform on ELOS. She  confirms she will be returning to work after Jan 15 as they have accumulating bills. She states her dtr can help after 5pm but unsure on how much support. SW informed will follow-up with updates.   *Sw went by pt room to complete assessment but pt sleep. Wife present. SW provided statement of service. Chart review and will be updated once pt is available.   [name], MSW, LCSW Office: [phone] Cell: [phone] Fax: [phone]

## 2023-06-10 NOTE — Progress Notes (Signed)
PROGRESS NOTE   Subjective/Complaints:  Still no bm. Blood tinged urine. Feels "disoriented from meds". Spasticity still a major issue in legs. Up in [place] when I came in.  ROS: Patient denies fever, rash, sore throat, blurred vision,  nausea, vomiting, diarrhea, cough, shortness of breath or chest pain, headache, or mood change.   Objective:   No results found.  Recent Labs    06/08/23 0539 06/10/23 0520  WBC 9.0 7.6  HGB 12.2* 11.1*  HCT 33.6* 30.4*  PLT 276 286   Recent Labs    06/08/23 0539 06/10/23 0520  NA 137 137  K 3.9 3.9  CL 103 104  CO2 26 27  GLUCOSE 109* 103*  BUN 7 7  CREATININE 0.71 0.56*  CALCIUM 9.5 9.3    Intake/Output Summary (Last 24 hours) at 06/10/2023 1610 Last data filed at 06/10/2023 0700 Gross per 24 hour  Intake 600 ml  Output 2650 ml  Net -2050 ml        Physical Exam: Vital Signs Blood pressure 107/77, pulse 96, temperature 97.6 F (36.4 C), resp. rate 20, height 5\' 9"  (1.753 m), weight 93 kg, SpO2 100%.    Constitutional: No distress . Vital signs reviewed. HEENT: NCAT, EOMI, oral membranes moist Neck: supple Cardiovascular: RRR without murmur. No JVD    Respiratory/Chest: CTA Bilaterally without wheezes or rales. Normal effort    GI/Abdomen: BS +, non-tender, sl distended Ext: no clubbing, cyanosis, or edema Psych: flat but cooperative  Neurological:        Fairly alert and oriented x 3. Normal insight and awareness. Intact Memory. Normal language and speech. Cranial nerve exam unremarkable. MMT: RUE 5/5. LUE 5/5 deltoid, biceps, 4+ triceps, wrist extensors, hand intrinsics. Pt appears to be able to move all muscle both lower extremities but MMT remains inhibited by spasticity. Pt with decreased LT in finger tips and from inguinal region downard. DTR's 3+, Tone MAS 3 in LE's   Assessment/Plan: 1. Functional deficits which require 3+ hours per day of  interdisciplinary therapy in a comprehensive inpatient rehab setting. Physiatrist is providing close team supervision and 24 hour management of active medical problems listed below. Physiatrist and rehab team continue to assess barriers to discharge/monitor patient progress toward functional and medical goals  Care Tool:  Bathing    Body parts bathed by patient: Right arm, Left arm, Chest, Abdomen, Front perineal area, Right upper leg, Left upper leg, Face   Body parts bathed by helper: Left lower leg, Right lower leg, Buttocks     Bathing assist Assist Level: Moderate Assistance - Patient 50 - 74% (based on simulated task and his inability to demonstrate come from sit to stand for LB task performance.)     Upper Body Dressing/Undressing Upper body dressing   What is the patient wearing?: Pull over shirt    Upper body assist Assist Level: Set up assist (using modified technique)    Lower Body Dressing/Undressing Lower body dressing      What is the patient wearing?: Pants, Incontinence brief     Lower body assist Assist for lower body dressing: Total Assistance - Patient < 25%     Toileting Toileting  Toileting assist Assist for toileting: Total Assistance - Patient < 25%     Transfers Chair/bed transfer  Transfers assist  Chair/bed transfer activity did not occur: Safety/medical concerns (unsafe to get up)  Chair/bed transfer assist level: 2 Helpers     Locomotion Ambulation   Ambulation assist   Ambulation activity did not occur: Safety/medical concerns          Walk 10 feet activity   Assist  Walk 10 feet activity did not occur: Safety/medical concerns        Walk 50 feet activity   Assist Walk 50 feet with 2 turns activity did not occur: Safety/medical concerns         Walk 150 feet activity   Assist           Walk 10 feet on uneven surface  activity   Assist Walk 10 feet on uneven surfaces activity did not occur:  Safety/medical concerns         Wheelchair     Assist Is the patient using a wheelchair?: No (was not safe to attempt transferring into w/c at this time, but yes will use w/c) Type of Wheelchair: Manual Wheelchair activity did not occur: Safety/medical concerns         Wheelchair 50 feet with 2 turns activity    Assist    Wheelchair 50 feet with 2 turns activity did not occur: Safety/medical concerns       Wheelchair 150 feet activity     Assist  Wheelchair 150 feet activity did not occur: Safety/medical concerns       Blood pressure 107/77, pulse 96, temperature 97.6 F (36.4 C), resp. rate 20, height 5\' 9"  (1.753 m), weight 93 kg, SpO2 100%.  Medical Problem List and Plan: 1. Functional deficits secondary to chronic C6-T1 cervical myelopathy s/p ACDF 06/05/2023             -patient may shower with collar on. Will order replacement pads             -ELOS/Goals: 18-24 days, goals are mod I to supervision with self-care and mobility   Admit to CIR- first day of evaluations- difficulty tolerating due to lack of sleep as well severe spasticity  - needed significant titration of spasticity agents  Con't CIR PT, OT  2.  Antithrombotics: -DVT/anticoagulation:  Pharmaceutical: 12/15- Lovenox 2x/day  -but held today due to hematuria- Dopplers negative             -antiplatelet therapy: none   3. Pain Management: Tylenol as needed;   Norco 5-10 mg q 4 hours prn             - see spasticity discussion below  12/15- c/o neck pain- not sure if meds enough- if doesn't improve by tomorrow, can increase to Oxycodone if need be.   12/16- didn't complain of neck pain today   -is having some neuro-sedation from meds 4. Mood/Behavior/Sleep: LCSW to evaluate and provide emotional support             -antipsychotic agents: n/a   5. Neuropsych/cognition: This patient is capable of making decisions on his own behalf.   6. Skin/Wound Care: Routine skin care checks   7.  Fluids/Electrolytes/Nutrition: Routine Is and Os and follow-up chemistries   8. Significant LE spasticity:             -increased baclofen to 20mg  q8 hours today. Pt states that it's helping but that spasticity increases over the hour or  so prior to the subsequent dose. Consider increasing to q6 hours over weekend BP/SE permitting             -valium 5mg  q6 prn             -rom, activity with PT  12/14- spasticity still severe- no improvement with "robaxin, flexeril"- will change Valium to 5 mg q6 Hours- and see if can tolerate- since more spasms, than tone- and added Zanaflex 4 mg q6 hours prn- for more spasms.   12/16--tone present and still painful for him. It is better than last week, however. Will hold on further adjustment in meds regardless as he is feeling some disorientation   -may need to reduce valium.   -consider increasing baclofen to q6   9. Neurogenic bowel:             -he is continent             -last bm yesterday             -senna-s 2 at bedtime, observe for pattern  12/14- LBM 2 days ago- likely constipation in setting of neurogenic bowel- if no BM by tomorrow, will intervene  12/15- Ordered Sorbitol for bowels- 30cc after therapy- also suppository if no BM. Increased Senna to 2 tabs BID from daily since not having regular BM's  12/16 having flatus, still no bm (despite 12/15 on "last bm date")   -60 cc sorbitol today   -SSE in PM if still no bm 10. Neurogenic bladder:             -pt with frequency, generally continent. He does have some incontinence at night.             -unsure of PVR's.             -begin timed voids while awake             -check PVR's  12/14- ordered bladder scans- q6 hours- to make sure no problems with retention- will order for 3 days-  12/15- only Bladder scan is 113cc- so retaining some. Will con't to follow  11. Hematuria  12/15- blood tinged urine and bleeding from meatus- will check U/A and Cx- told nursing to collect urine ASAP and let  me know when back- of note, no trauma per pt/wife, no caths.   12/16 still some blood, ua neg, ucx pending, hgb with sl drop to 11.1   -likely d/t mild trauma during IC's.    -use coude cath   -lovenox held   -will add flomax. Hopefully bp can support 12. Cough  12/15- sounds like upper airway sounds on exam- will add Mucinex 600 mg BID and cough drops prn.      LOS: 3 days A FACE TO FACE EVALUATION WAS PERFORMED  Ranelle Oyster 06/10/2023, 9:07 AM

## 2023-06-10 NOTE — Progress Notes (Signed)
Physical Therapy Session Note  Patient Details  Name: Leon Rodriguez MRN: [id number] Date of Birth: [birth year]  Today's Date: 06/10/2023 PT Individual Time: 0800-0900 PT Individual Time Calculation (min): 60 min   Short Term Goals: Week 1:  PT Short Term Goal 1 (Week 1): Pt will perform supine<>sit with max A of 1 consistently PT Short Term Goal 2 (Week 1): Pt will perform sit<>stands using LRAD with mod A of PT Short Term Goal 3 (Week 1): Pt will perform bed<>chair transfers using LRAD with mod A of 1 and +2 for safety as needed PT Short Term Goal 4 (Week 1): Pt will ambulate at least 78ft using LRAD with mod A of 1 and +2 as needed  Skilled Therapeutic Interventions/Progress Updates:    pt received in bed and agreeable to therapy. Pt sleepy until sitting upright and dozing intermittently when left sitting. Pt reports pain in his hip than he reports can get up to 10/10, premedicated. Rest and positioning provided as needed. Donned c-collar in sitting.   Bed mobility with max a for BLE  and mod a for trunk elevation. Session was limited by extreme adductor and extensor tone in BLE. Required extended time stretching to prepare for movement. Performed stedy transfer with towel roll between knees and ankles, then gait belt wrapped around ankles and stedy to prevent tone related knee flexion. Pt required multiple attempts to achieve Sit to stand in stedy with max a. Cueing for technique throughout. Pt sat against stedy perch for several minutes x 2 for weight bearing to manage tone and core control. MD in/out for morning rounds while pt 3/4 standing in stedy. Pt then assisted to w/c and was left with all needs in reach and alarm active.   Therapy Documentation Precautions:  Precautions Precautions: Fall, Cervical, Other (comment) Precaution Comments: hx of many falls, severe B LE hypertonia with muscle spasms Required Braces or Orthoses: Cervical Brace Cervical Brace: Hard collar (to be worn  out of bed) Restrictions Weight Bearing Restrictions Per Provider Order: No General:       Therapy/Group: Individual Therapy  Juluis Rainier 06/10/2023, 12:46 PM

## 2023-06-10 NOTE — Care Management (Signed)
[facility]patient [facility] Individual Statement of Services  Patient Name:  [name]  Date:  06/10/2023  Welcome to the [facility]patient [facility].  Our goal is to provide you with an individualized program based on your diagnosis and situation, designed to meet your specific needs.  With this comprehensive rehabilitation program, you will be expected to participate in at least 3 hours of rehabilitation therapies Monday-Friday, with modified therapy programming on the weekends.  Your rehabilitation program will include the following services:  Physical Therapy (PT), Occupational Therapy (OT), 24 hour per day rehabilitation nursing, Therapeutic Recreaction (TR), Psychology, Neuropsychology, Care Coordinator, Rehabilitation Medicine, Nutrition Services, Pharmacy Services, and Other  Weekly team conferences will be held on Tuesdays to discuss your progress.  Your Inpatient Rehabilitation Care Coordinator will talk with you frequently to get your input and to update you on team discussions.  Team conferences with you and your family in attendance may also be held.  Expected length of stay: 3.5 weeks    Overall anticipated outcome: Contact Guard to Supervision  Depending on your progress and recovery, your program may change. Your Inpatient Rehabilitation Care Coordinator will coordinate services and will keep you informed of any changes. Your Inpatient Rehabilitation Care Coordinator's name and contact numbers are listed  below.  The following services may also be recommended but are not provided by the Inpatient Rehabilitation Center:  Driving Evaluations Home Health Rehabiltiation Services Outpatient Rehabilitation Services Vocational Rehabilitation   Arrangements will be made to provide these services after discharge if needed.  Arrangements include referral to agencies that provide these services.  Your insurance has been verified to be:   Medicaid Healthy Blue  Your  primary doctor is:  Kathleen Lime  Pertinent information will be shared with your doctor and your insurance company.  Inpatient Rehabilitation Care Coordinator:  Susie Cassette [phone] or [phone]  Information discussed with and copy given to patient by: Gretchen Short, 06/10/2023, 10:32 AM

## 2023-06-10 NOTE — Progress Notes (Signed)
Patient ID: Leon Rodriguez, male   DOB: 1966/04/20, 57 y.o.   MRN: [id number] Met with the patient and wife to review current situation, rehab schedule, team conference and plan of care. Wife expressed concern regarding bowel and bladder issues but reports that pain appears to be better controlled. Requiring I+O cath on Flomax; hopeful he will be able to void on his own for discharge. Constipation addressed; planned enema after dinner.  Also noted patient walked to the BR on the first day post op and has not walked since and voided on his own after surgery but has not been able to do so since; forwarded concern. Steri strips to neck; c-collar. S.O. also on FMLA through January but reports needs to return to work and concerned that patient will need care and she will not be there. Continue to follow along to address educational needs to facilitate preparation for discharge. Pamelia Hoit

## 2023-06-10 NOTE — Plan of Care (Signed)
  Problem: Consults Goal: RH GENERAL PATIENT EDUCATION Description: See Patient Education module for education specifics. Outcome: Progressing   Problem: RH BOWEL ELIMINATION Goal: RH STG MANAGE BOWEL WITH ASSISTANCE Description: STG Manage Bowel with toileting Assistance. Outcome: Progressing   Problem: RH BOWEL ELIMINATION Goal: RH STG MANAGE BOWEL W/MEDICATION W/ASSISTANCE Description: STG Manage Bowel with Medication with mod I Assistance. Outcome: Progressing   Problem: RH SAFETY Goal: RH STG ADHERE TO SAFETY PRECAUTIONS W/ASSISTANCE/DEVICE Description: STG Adhere to Safety Precautions With cues Assistance/Device. Outcome: Progressing   Problem: RH PAIN MANAGEMENT Goal: RH STG PAIN MANAGED AT OR BELOW PT'S PAIN GOAL Description: < 4 with prns Outcome: Progressing

## 2023-06-10 NOTE — Discharge Summary (Signed)
Physician Discharge Summary  Patient ID: Leon Rodriguez MRN: [id number] DOB/AGE: 1965/09/29 57 y.o.  Admit date: 06/07/2023 Discharge date: 06/14/2023  Discharge Diagnoses:  Principal Problem:   Cervical myelopathy (HCC) Shall deficits secondary to cervical myelopathy Severe spasticity Neurogenic bowel Neurogenic bladder Hematuria Cough Constipation Fever  Tachycardia  Discharged Condition: serious  Significant Diagnostic Studies:  Narrative & Impression  CLINICAL DATA:  Positive D-dimer   EXAM: CT ANGIOGRAPHY CHEST WITH CONTRAST   TECHNIQUE: Multidetector CT imaging of the chest was performed using the standard protocol during bolus administration of intravenous contrast. Multiplanar CT image reconstructions and MIPs were obtained to evaluate the vascular anatomy.   RADIATION DOSE REDUCTION: This exam was performed according to the departmental dose-optimization program which includes automated exposure control, adjustment of the mA and/or kV according to patient size and/or use of iterative reconstruction technique.   CONTRAST:  75mL OMNIPAQUE IOHEXOL 350 MG/ML SOLN   COMPARISON:  Cervical spine CT 06/11/2023.   FINDINGS: Cardiovascular: Satisfactory opacification of the pulmonary arteries to the segmental level. No evidence of pulmonary embolism. Normal heart size. No pericardial effusion.   Mediastinum/Nodes: No enlarged mediastinal, hilar, or axillary lymph nodes. Thyroid gland, trachea, and esophagus demonstrate no significant findings.   Lungs/Pleura: There are ill-defined ground-glass nodular densities in the inferior right lower lobe. There are patchy ground-glass opacities in both lower lobes. There is no pleural effusion or pneumothorax.   Upper Abdomen: No acute abnormality.   Musculoskeletal: Anterior fusion hardware is seen at C6 and C7. There is prevertebral swelling and edema at this level measuring up to 2 cm. There is also  subcutaneous edema in the anterior mid neck with overlying skin staples. No discrete fluid collection or soft tissue gas identified.   Review of the MIP images confirms the above findings.   IMPRESSION: 1. No evidence for pulmonary embolism. 2. Ill-defined ground-glass nodular densities in the inferior right lower lobe and patchy ground-glass opacities in both lower lobes, likely infectious/inflammatory. 3. Anterior fusion hardware at C6 and C7 with prevertebral swelling and edema measuring up to 2 cm. Findings may be postsurgical in nature. 4. Subcutaneous edema in the anterior mid neck with overlying skin staples. No discrete fluid collection or soft tissue gas identified.     Electronically Signed   By: Darliss Cheney M.D.   On: 06/13/2023 02:09    Narrative & Impression  CLINICAL DATA:  Fever.   EXAM: PORTABLE CHEST 1 VIEW   COMPARISON:  01/06/2023.   FINDINGS: The heart size and mediastinal contours are within normal limits. No consolidation, effusion, or pneumothorax. No acute osseous abnormality. Cervical spinal fusion hardware is noted. Surgical staples are noted over the upper mediastinum.   IMPRESSION: No active disease.     Electronically Signed   By: Thornell Sartorius M.D.   On: 06/12/2023 22:47    Narrative & Impression  CLINICAL DATA:  Spinal cord injury, follow up; Spinal cord injury, follow up New SCI with increasing spasticity, new neurogenic bowel and bladder since surgery   EXAM: MRI CERVICAL AND THORACIC SPINE WITHOUT CONTRAST   TECHNIQUE: Multiplanar and multiecho pulse sequences of the cervical spine, to include the craniocervical junction and cervicothoracic junction, and the thoracic spine, were obtained without intravenous contrast.   COMPARISON:  MR C SPine 05/10/23   FINDINGS: Limitations: Limited exam due to the degree of motion artifact.   MRI CERVICAL SPINE FINDINGS   Alignment: Physiologic.   Vertebrae: Status post C5-C7  ACDF. The degree of motion artifact  markedly limits the ability to assess for the presence of acute osseous abnormality or hardware failure. There is prevertebral soft tissue edema of the C7-T3 levels, favored to be postsurgical.   Cord: There is persistent T2 hyperintense signal abnormality within the spinal cord at the C7-T1 level.   Posterior Fossa, vertebral arteries, paraspinal tissues: Prevertebral soft tissue edema of the C6-T3 levels is favored to be postsurgical. There is likely a small hematoma in the left lateral (series [id number].   Disc levels:   The degree of motion artifact precludes detailed characterization of the degree of spinal canal stenosis.   There is persistent severe spinal canal stenosis of the C6-T1 levels.   MRI THORACIC SPINE FINDINGS   Alignment:  Physiologic.   Vertebrae: No fracture, evidence of discitis, or bone lesion.   Cord:  Normal signal and morphology.   Paraspinal and other soft tissues: Small amount of layering fluid in the esophagus, which places the patient at risk for aspiration. There is a posterior mediastinal hematoma which likely causes mass effect on the posterior esophagus. Correlate for symptoms of dysphagia.   Disc levels:   There are multilevel small degenerative disc bulges in the thoracic spine, most notably at T6-T7, T7-T8, T11-T12, T12-L1. There is likely moderate spinal canal narrowing at T6-T7. Likely severe right and moderate left neural foraminal narrowing at T11-T12   IMPRESSION: 1. Limited exam due to the degree of motion artifact. Within this limitation, there is persistent severe spinal canal stenosis of the C6-T1 levels with associated T2 hyperintense cord signal abnormality at the C7-T1 level, unchanged from prior. Repeat exam is recommended if there is concern for progressive stenosis or compressive myelopathy. 2. Posterior mediastinal hematoma which likely causes mass effect on the posterior esophagus.  Correlate for symptoms of dysphagia. 3. Prevertebral soft tissue edema of the C6-T3 levels is favored to be postsurgical. There is likely a small hematoma in the left lateral neck. 4. No acute abnormality in the thoracic spine. Thoracic spine degenerative changes, as above.     Electronically Signed   By: Lorenza Cambridge M.D.   On: 06/11/2023 12:04       CT Angio Chest Pulmonary Embolism (PE) W or WO Contrast Result Date: 06/13/2023 CLINICAL DATA:  Positive D-dimer EXAM: CT ANGIOGRAPHY CHEST WITH CONTRAST TECHNIQUE: Multidetector CT imaging of the chest was performed using the standard protocol during bolus administration of intravenous contrast. Multiplanar CT image reconstructions and MIPs were obtained to evaluate the vascular anatomy. RADIATION DOSE REDUCTION: This exam was performed according to the departmental dose-optimization program which includes automated exposure control, adjustment of the mA and/or kV according to patient size and/or use of iterative reconstruction technique. CONTRAST:  75mL OMNIPAQUE IOHEXOL 350 MG/ML SOLN COMPARISON:  Cervical spine CT 06/11/2023. FINDINGS: Cardiovascular: Satisfactory opacification of the pulmonary arteries to the segmental level. No evidence of pulmonary embolism. Normal heart size. No pericardial effusion. Mediastinum/Nodes: No enlarged mediastinal, hilar, or axillary lymph nodes. Thyroid gland, trachea, and esophagus demonstrate no significant findings. Lungs/Pleura: There are ill-defined ground-glass nodular densities in the inferior right lower lobe. There are patchy ground-glass opacities in both lower lobes. There is no pleural effusion or pneumothorax. Upper Abdomen: No acute abnormality. Musculoskeletal: Anterior fusion hardware is seen at C6 and C7. There is prevertebral swelling and edema at this level measuring up to 2 cm. There is also subcutaneous edema in the anterior mid neck with overlying skin staples. No discrete fluid collection or  soft tissue gas identified. Review of the MIP  images confirms the above findings. IMPRESSION: 1. No evidence for pulmonary embolism. 2. Ill-defined ground-glass nodular densities in the inferior right lower lobe and patchy ground-glass opacities in both lower lobes, likely infectious/inflammatory. 3. Anterior fusion hardware at C6 and C7 with prevertebral swelling and edema measuring up to 2 cm. Findings may be postsurgical in nature. 4. Subcutaneous edema in the anterior mid neck with overlying skin staples. No discrete fluid collection or soft tissue gas identified. Electronically Signed   By: Darliss Cheney M.D.   On: 06/13/2023 02:09   DG Chest Port 1 View Result Date: 06/12/2023 CLINICAL DATA:  Fever. EXAM: PORTABLE CHEST 1 VIEW COMPARISON:  01/06/2023. FINDINGS: The heart size and mediastinal contours are within normal limits. No consolidation, effusion, or pneumothorax. No acute osseous abnormality. Cervical spinal fusion hardware is noted. Surgical staples are noted over the upper mediastinum. IMPRESSION: No active disease. Electronically Signed   By: Thornell Sartorius M.D.   On: 06/12/2023 22:47   DG FL GUIDED LUMBAR PUNCTURE Result Date: 06/12/2023 CLINICAL DATA:  57 year old male with recent C6-C7 and C7-T1 discectomy and fusion presents with worsening leg spasm. Request for fluoro guided C-spine myelogram. EXAM: LUMBAR PUNCTURE UNDER FLUOROSCOPIC GUIDANCE COMPARISON:  None Available. FLUOROSCOPY: Radiation Exposure Index (as provided by the fluoroscopic device): 49.6 mGy Kerma PROCEDURE: Informed consent was obtained from the patient prior to the procedure, including potential risks and complications of headache, allergy, bleeding, damage to adjacent structures, infection, spinal hematoma, and persistent CSF leak which may require additional procedure. Time-out was performed to verify correct patient and procedure. Allergies were reviewed. With the patient prone, the lower back was prepped with  Betadine. 1% Lidocaine was used for local anesthesia. Lumbar puncture was attempted at the L2- L3 level using a 5 inch 22 gauge needle, no CSF return. Additional lumbar puncture was attempted at the level of L1-L2, unsuccessful. Dr. Lowella Dandy was called to the procedure room for assistance, however, patient was experiencing dyspneic and became diaphoretic, patient asked to stop the procedure. The procedure was terminated. MR L-spine from 04/28/2023 reviewed by Dr. Lowella Dandy, it appears that patient does not have much CSF in the lumbar spine. IMPRESSION: Unsuccessful lumbar puncture.  Myelogram was not performed. This procedure was performed by Leon Ion, PA-C under the supervision of Richarda Overlie, MD. Electronically Signed   By: Richarda Overlie M.D.   On: 06/12/2023 16:08   CT CERVICAL SPINE W CONTRAST Result Date: 06/12/2023 CLINICAL DATA:  Cervical spinal stenosis EXAM: CT CERVICAL SPINE WITH CONTRAST TECHNIQUE: Multidetector CT imaging of the cervical spine was performed during intravenous contrast administration. Multiplanar CT image reconstructions were also generated. RADIATION DOSE REDUCTION: This exam was performed according to the departmental dose-optimization program which includes automated exposure control, adjustment of the mA and/or kV according to patient size and/or use of iterative reconstruction technique. CONTRAST:  75mL OMNIPAQUE IOHEXOL 350 MG/ML SOLN COMPARISON:  None Available. FINDINGS: Alignment: Normal. Skull base and vertebrae: Multilevel vertebral body height loss throughout the cervical spine. Flowing anterior osteophytes along the length of the cervical spine. C6-T1 ACDF. Soft tissues and spinal canal: Postsurgical changes of left anterior approach for cervical spine fusion. Low-attenuation collection ventral to the spinal column at the C6-T2 levels. Disc levels:  No high-grade bony spinal canal stenosis. Upper chest: Negative Other: None. IMPRESSION: 1. Postsurgical changes of left anterior  approach for cervical spine fusion. Low-attenuation collection ventral to the spinal column at the C6-T2 levels is nonspecific, without specific features of infection. 2. No high-grade bony  spinal canal stenosis. Electronically Signed   By: Deatra Robinson M.D.   On: 06/12/2023 02:26   MR CERVICAL SPINE WO CONTRAST Result Date: 06/11/2023 CLINICAL DATA:  Spinal cord injury, follow up; Spinal cord injury, follow up New SCI with increasing spasticity, new neurogenic bowel and bladder since surgery EXAM: MRI CERVICAL AND THORACIC SPINE WITHOUT CONTRAST TECHNIQUE: Multiplanar and multiecho pulse sequences of the cervical spine, to include the craniocervical junction and cervicothoracic junction, and the thoracic spine, were obtained without intravenous contrast. COMPARISON:  MR C SPine 05/10/23 FINDINGS: Limitations: Limited exam due to the degree of motion artifact. MRI CERVICAL SPINE FINDINGS Alignment: Physiologic. Vertebrae: Status post C5-C7 ACDF. The degree of motion artifact markedly limits the ability to assess for the presence of acute osseous abnormality or hardware failure. There is prevertebral soft tissue edema of the C7-T3 levels, favored to be postsurgical. Cord: There is persistent T2 hyperintense signal abnormality within the spinal cord at the C7-T1 level. Posterior Fossa, vertebral arteries, paraspinal tissues: Prevertebral soft tissue edema of the C6-T3 levels is favored to be postsurgical. There is likely a small hematoma in the left lateral (series 833. Disc levels: The degree of motion artifact precludes detailed characterization of the degree of spinal canal stenosis. There is persistent severe spinal canal stenosis of the C6-T1 levels. MRI THORACIC SPINE FINDINGS Alignment:  Physiologic. Vertebrae: No fracture, evidence of discitis, or bone lesion. Cord:  Normal signal and morphology. Paraspinal and other soft tissues: Small amount of layering fluid in the esophagus, which places the patient  at risk for aspiration. There is a posterior mediastinal hematoma which likely causes mass effect on the posterior esophagus. Correlate for symptoms of dysphagia. Disc levels: There are multilevel small degenerative disc bulges in the thoracic spine, most notably at T6-T7, T7-T8, T11-T12, T12-L1. There is likely moderate spinal canal narrowing at T6-T7. Likely severe right and moderate left neural foraminal narrowing at T11-T12 IMPRESSION: 1. Limited exam due to the degree of motion artifact. Within this limitation, there is persistent severe spinal canal stenosis of the C6-T1 levels with associated T2 hyperintense cord signal abnormality at the C7-T1 level, unchanged from prior. Repeat exam is recommended if there is concern for progressive stenosis or compressive myelopathy. 2. Posterior mediastinal hematoma which likely causes mass effect on the posterior esophagus. Correlate for symptoms of dysphagia. 3. Prevertebral soft tissue edema of the C6-T3 levels is favored to be postsurgical. There is likely a small hematoma in the left lateral neck. 4. No acute abnormality in the thoracic spine. Thoracic spine degenerative changes, as above. Electronically Signed   By: Lorenza Cambridge M.D.   On: 06/11/2023 12:04   MR THORACIC SPINE WO CONTRAST Result Date: 06/11/2023 CLINICAL DATA:  Spinal cord injury, follow up; Spinal cord injury, follow up New SCI with increasing spasticity, new neurogenic bowel and bladder since surgery EXAM: MRI CERVICAL AND THORACIC SPINE WITHOUT CONTRAST TECHNIQUE: Multiplanar and multiecho pulse sequences of the cervical spine, to include the craniocervical junction and cervicothoracic junction, and the thoracic spine, were obtained without intravenous contrast. COMPARISON:  MR C SPine 05/10/23 FINDINGS: Limitations: Limited exam due to the degree of motion artifact. MRI CERVICAL SPINE FINDINGS Alignment: Physiologic. Vertebrae: Status post C5-C7 ACDF. The degree of motion artifact markedly  limits the ability to assess for the presence of acute osseous abnormality or hardware failure. There is prevertebral soft tissue edema of the C7-T3 levels, favored to be postsurgical. Cord: There is persistent T2 hyperintense signal abnormality within the spinal cord at  the C7-T1 level. Posterior Fossa, vertebral arteries, paraspinal tissues: Prevertebral soft tissue edema of the C6-T3 levels is favored to be postsurgical. There is likely a small hematoma in the left lateral (series [id number]. Disc levels: The degree of motion artifact precludes detailed characterization of the degree of spinal canal stenosis. There is persistent severe spinal canal stenosis of the C6-T1 levels. MRI THORACIC SPINE FINDINGS Alignment:  Physiologic. Vertebrae: No fracture, evidence of discitis, or bone lesion. Cord:  Normal signal and morphology. Paraspinal and other soft tissues: Small amount of layering fluid in the esophagus, which places the patient at risk for aspiration. There is a posterior mediastinal hematoma which likely causes mass effect on the posterior esophagus. Correlate for symptoms of dysphagia. Disc levels: There are multilevel small degenerative disc bulges in the thoracic spine, most notably at T6-T7, T7-T8, T11-T12, T12-L1. There is likely moderate spinal canal narrowing at T6-T7. Likely severe right and moderate left neural foraminal narrowing at T11-T12 IMPRESSION: 1. Limited exam due to the degree of motion artifact. Within this limitation, there is persistent severe spinal canal stenosis of the C6-T1 levels with associated T2 hyperintense cord signal abnormality at the C7-T1 level, unchanged from prior. Repeat exam is recommended if there is concern for progressive stenosis or compressive myelopathy. 2. Posterior mediastinal hematoma which likely causes mass effect on the posterior esophagus. Correlate for symptoms of dysphagia. 3. Prevertebral soft tissue edema of the C6-T3 levels is favored to be postsurgical.  There is likely a small hematoma in the left lateral neck. 4. No acute abnormality in the thoracic spine. Thoracic spine degenerative changes, as above. Electronically Signed   By: Lorenza Cambridge M.D.   On: 06/11/2023 12:04   VAS [place] LOWER EXTREMITY VENOUS (DVT) Result Date: 06/08/2023  Lower Venous DVT Study Patient Name:  Leon Rodriguez  Date of Exam:   06/07/2023 Medical Rec #: [id number]         Accession #:    [id number] Date of Birth: 09-01-1965         Patient Gender: M Patient Age:   88 years Exam Location:  [facility] Procedure:      VAS [place] LOWER EXTREMITY VENOUS (DVT) Referring Phys: Huntley Dec TOMLINSON --------------------------------------------------------------------------------  Indications: Pain.  Risk Factors: None identified. Limitations: Poor ultrasound/tissue interface and patient immobility, patient positioning, patient pain tolerance, leg spasms. Comparison Study: No prior studies. Performing Technologist: Chanda Busing RVT  Examination Guidelines: A complete evaluation includes B-mode imaging, spectral Doppler, color Doppler, and power Doppler as needed of all accessible portions of each vessel. Bilateral testing is considered an integral part of a complete examination. Limited examinations for reoccurring indications may be performed as noted. The reflux portion of the exam is performed with the patient in reverse Trendelenburg.  +---------+---------------+---------+-----------+----------+-------------------+ RIGHT    CompressibilityPhasicitySpontaneityPropertiesThrombus Aging      +---------+---------------+---------+-----------+----------+-------------------+ CFV      Full           Yes      Yes                                      +---------+---------------+---------+-----------+----------+-------------------+ SFJ      Full                                                              +---------+---------------+---------+-----------+----------+-------------------+  FV Prox                 Yes      Yes                                      +---------+---------------+---------+-----------+----------+-------------------+ FV Mid                  Yes      Yes                                      +---------+---------------+---------+-----------+----------+-------------------+ FV Distal               Yes      Yes                                      +---------+---------------+---------+-----------+----------+-------------------+ PFV                                                   Not well visualized +---------+---------------+---------+-----------+----------+-------------------+ POP      Full           Yes      Yes                                      +---------+---------------+---------+-----------+----------+-------------------+ PTV      Full                                                             +---------+---------------+---------+-----------+----------+-------------------+ PERO     Full                                                             +---------+---------------+---------+-----------+----------+-------------------+   +---------+---------------+---------+-----------+----------+-------------------+ LEFT     CompressibilityPhasicitySpontaneityPropertiesThrombus Aging      +---------+---------------+---------+-----------+----------+-------------------+ CFV      Full           Yes      Yes                                      +---------+---------------+---------+-----------+----------+-------------------+ SFJ      Full                                                             +---------+---------------+---------+-----------+----------+-------------------+ FV Prox  Full                                                             +---------+---------------+---------+-----------+----------+-------------------+  FV  Mid                  Yes      Yes                                      +---------+---------------+---------+-----------+----------+-------------------+ FV Distal               Yes      Yes                                      +---------+---------------+---------+-----------+----------+-------------------+ PFV                                                   Not well visualized +---------+---------------+---------+-----------+----------+-------------------+ POP      Full           Yes      Yes                                      +---------+---------------+---------+-----------+----------+-------------------+ PTV      Full                                                             +---------+---------------+---------+-----------+----------+-------------------+ PERO     Full                                                             +---------+---------------+---------+-----------+----------+-------------------+     Summary: RIGHT: - There is no evidence of deep vein thrombosis in the lower extremity. However, portions of this examination were limited- see technologist comments above.  - No cystic structure found in the popliteal fossa.  LEFT: - There is no evidence of deep vein thrombosis in the lower extremity. However, portions of this examination were limited- see technologist comments above.  - No cystic structure found in the popliteal fossa.  *See table(s) above for measurements and observations. Electronically signed by Heath Lark on 06/08/2023 at 12:35:09 PM.    Final    DG Cervical Spine 1 View Result Date: 06/05/2023 CLINICAL DATA:  Fusion EXAM: Intraoperative fluoroscopy COMPARISON:  None Available. FINDINGS: Four fluoroscopic spot images submitted for review demonstrate placement of anterior fixation plates and prosthetic disc material along the lower cervical spine. Imaging was obtained to aid in treatment. Please correlate with real-time fluoroscopy of  45.1 seconds. Cumulative dose 13.4 mGy IMPRESSION: Intraoperative fluoroscopy Electronically Signed   By: Karen Kays M.D.   On: 06/05/2023 18:44   DG C-Arm 1-60 Min-No Report Result Date: 06/05/2023 Fluoroscopy was utilized by the requesting physician.  No radiographic interpretation.   DG C-Arm 1-60 Min-No Report Result Date: 06/05/2023 Fluoroscopy was utilized by the requesting physician.  No radiographic interpretation.  DG C-Arm 1-60 Min-No Report Result Date: 06/05/2023 Fluoroscopy was utilized by the requesting physician.  No radiographic interpretation.    Labs:  Basic Metabolic Panel: Recent Labs  Lab 06/08/23 0539 06/10/23 0520 06/12/23 2128 06/13/23 2332 06/14/23 0341  NA 137 137 137  --  136  K 3.9 3.9 3.8  --  3.9  CL 103 104 102  --  107  CO2 26 27 26   --  22  GLUCOSE 109* 103* 167*  --  116*  BUN 7 7 10   --  8  CREATININE 0.71 0.56* 0.80 0.78 0.69  CALCIUM 9.5 9.3 9.5  --  8.7*    CBC: Recent Labs  Lab 06/12/23 2128 06/13/23 2332 06/14/23 0341  WBC 8.0 6.5 6.3  NEUTROABS 7.3 4.9 4.5  HGB 11.3* 10.8* 10.0*  HCT 31.7* 30.4* 28.1*  MCV 95.8 96.8 95.9  PLT 292 273 258    Brief HPI:   Leon Rodriguez is a 57 y.o. male with a history of worsening bilateral lower extremity numbness tingling and difficulty walking he has had balance difficulties progressively worsening for many months and at time of presentation was utilizing a wheelchair. He also complains of spasticity in his lower extremities and some intermittent numbness and tingling in his hands. Evaluated by Dr. Jake Samples and MRI of the cervical spine revealed C6-T1 severe cervical stenosis with cord compression consistent with myelopathy. He underwent arthrodesis of C5-6-7, C7-T1 anterior interbody technique by Dr. Jake Samples on 12/11. Currently receiving baclofen and diazepam for lower extremity spasms. No mention of neurogenic bowel or bladder. He remains in [place]en collar. He is tolerating his diet. Labs  are unremarkable. Past surgical history is significant for hiatal hernia repair, hip surgery as a teenager. No other significant medical history.    Hospital Course: Leon Rodriguez was admitted to rehab 06/07/2023 for inpatient therapies to consist of PT, ST and OT at least three hours five days a week. Past admission physiatrist, therapy team and rehab RN have worked together to provide customized collaborative inpatient rehab.  On admission, baclofen was increased to 20 mg every 8 hours and Valium 5 mg every 6 hours as needed continued.  He is continent of bowel and given Senokot S 2 tablets at bedtime and observed bowel patterns.  He reported urinary frequency but generally continent.  Timed voids while awake initiated as well as bladder scans every 6 hours/PVR checks.  BLE venous duplex negative for DVT. On 12-14, Valium 5 mg was changed to every 6 hours and added Zanaflex 4 mg every 6 hours as needed for more spasms.  On 12/15, required sorbitol and suppository for severe constipation.  Lovenox held due to blood clot in urine/hematuria.  Urinalysis and urine culture obtained.  Patient complained of cough 12/15 and Mucinex 600 mg twice daily and cough drops as needed.  Urinary retention of 434 cc after voiding 250 cc and staff performed In-N-Out catheterization.  Required this again later in the evening of approximately 500 cc retained urine without evidence of hematuria.  Plan to adjust medications due to some neuro sedation on 12/16.  Required 60 cc of sorbitol for constipation on 12/16.  Directed staff to use coud catheter for In-N-Out catheterization and Flomax added 12/16.  Urinalysis/culture negative for infection, positive for hemoglobin. Discussed with Dr Berle Mull symptoms on 12/17.  MRI of cervical and thoracic without contrast- ordered STAT>> having new tingling in LUE which was there before surgery. Unable to tolerate CT myelogram on 12/18. Re-ordered  for 12/19 with Valium. On the evening of  12/18, he developed tahcycardia, fever and mild hypotension. Sepsis work-up performed and TRH consulted. Proclacitonin and D-dimer elevated and CT chest with PE protocol obtained. No PE, but ill-defined ground-glass nodular densities in the inferior right lower lobe and patchy ground-glass opacities in both lower lobes, likely infectious/inflammatory. Started on vancomycin and on cefepime 2 grams q 8 hours. Covid, flu and RSV panels obtained. Lovenox restarted. On the evening of 12/19, Call from nursing staff regarding rigors, temp to 101.9 pulse 135 BP 176/79. Informed IMTS and discussed increasing Tylenol dosing. Requested new VS: temp now 102.9, pulse 141 and BP 135/115. Called rapid response and informed IMTS. Patient transferred to acute care on IMTS.  Blood pressures were monitored on TID basis and remained stable.  Rehab course: During patient's stay in rehab weekly team conferences were held to monitor patient's progress, set goals and discuss barriers to discharge. At admission, patient required max assist with basic self-care skills, +2 total assist with mobility.  Discharge disposition: 02-Transferred to [facility]      Allergies as of 06/13/2023   No Known Allergies      Medication List     ASK your doctor about these medications    Acetaminophen Extra Strength 500 MG Tabs Take 2 tablets (1,000 mg total) by mouth every 8 (eight) hours as needed for fever.   Baclofen 5 MG Tabs Take 1 tablet by mouth 3 (three) times daily as needed (muscle spasms).   Zantac 360 10 MG tablet Generic drug: famotidine Take 10 mg by mouth daily as needed for heartburn or indigestion.        Follow-up Information     Kathleen Lime, MD Follow up.   Specialty: Internal Medicine Contact information: [address] [place] [address] [phone]         Genice Rouge, MD Follow up.   Specialty: Physical Medicine and Rehabilitation Contact information: [address] [place] [address] [phone]         Dawley, Kendell Bane C, DO Follow up.   Contact information: [address] [place] [address] [phone]                 Signed: Milinda Antis 06/14/2023, 9:05 AM

## 2023-06-10 NOTE — Progress Notes (Signed)
Inpatient Rehabilitation Care Coordinator Assessment and Plan Patient Details  Name: Leon Rodriguez MRN: [id number] Date of Birth: July 08, 1965  Today's Date: 06/10/2023  Hospital Problems: Principal Problem:   Cervical myelopathy [facility])  Past Medical History:  Past Medical History:  Diagnosis Date   Arthritis    Hip pain    Past Surgical History:  Past Surgical History:  Procedure Laterality Date   ANTERIOR CERVICAL DECOMP/DISCECTOMY FUSION N/A 06/05/2023   Procedure: ANTERIOR CERVICAL DISCECTOMY AND FUSION CERVICAL SIX-SEVEN/CERVICAL SEVEN-THORACIC ONE;  Surgeon: Bethann Goo, DO;  Location: [facility] OR;  Service: Neurosurgery;  Laterality: N/A;  3C   APPENDECTOMY     CIRCUMCISION     HIATAL HERNIA REPAIR     HIP SURGERY     at 70 and 57 yo   Social History:  reports that he has been smoking cigarettes and cigars. He has never used smokeless tobacco. He reports that he does not currently use alcohol. He reports current drug use. Drug: Marijuana.  Family / Support Systems Marital Status: Married Spouse/Significant Other: Leon Rodriguez (wife) Anticipated Caregiver: Wife Leon Rodriguez Ability/Limitations of Caregiver: Wife intends to go back to work around the second week of January. Pt will need to be Mod I/intermittent supervision at time of discharge Caregiver Availability: 24/7 Family Dynamics: Pt lives with his wife.  Social History Preferred language: English Religion:      Abuse/Neglect Abuse/Neglect Assessment Can Be Completed: Yes Physical Abuse: Denies Verbal Abuse: Denies Sexual Abuse: Denies Exploitation of patient/patient's resources: Denies Self-Neglect: Denies  Patient response to: Social Isolation - How often do you feel lonely or isolated from those around you?: Never  Emotional Status    Patient / Family Perceptions, Expectations & Goals    Building surveyor: None Premorbid Home Care/DME Agencies: None Transportation available at  discharge: wife Is the patient able to respond to transportation needs?: Yes In the past 12 months, has lack of transportation kept you from medical appointments or from getting medications?: No In the past 12 months, has lack of transportation kept you from meetings, work, or from getting things needed for daily living?: No Resource referrals recommended: Neuropsychology  Discharge Planning Living Arrangements: Spouse/significant other Support Systems: Spouse/significant other Type of Residence: Private residence Insurance Resources: Media planner (specify) ([place]) Financial Screen Referred: No Does the patient have any problems obtaining your medications?: No Care Coordinator Barriers to Discharge: Decreased caregiver support, Lack of/limited family support, Insurance for SNF coverage, Other (comments) Care Coordinator Barriers to Discharge Comments: s/o returning to work in Jan 2025 Care Coordinator Anticipated Follow Up Needs: HH/OP, SNF Expected length of stay: 3.5 weeks  Clinical Impression Chart review completed for assessment. Will be updated once pt is available.   Shenise Wolgamott A Toya Palacios 06/10/2023, 2:50 PM

## 2023-06-10 NOTE — Progress Notes (Addendum)
Inpatient Rehabilitation  Patient information reviewed and entered into eRehab system by Oyuki Hogan M. Eri Mcevers, M.A., CCC/SLP, PPS Coordinator.  Information including medical coding, functional ability and quality indicators will be reviewed and updated through discharge.    

## 2023-06-11 ENCOUNTER — Inpatient Hospital Stay (HOSPITAL_COMMUNITY): Payer: Medicaid Other

## 2023-06-11 DIAGNOSIS — G959 Disease of spinal cord, unspecified: Secondary | ICD-10-CM | POA: Diagnosis not present

## 2023-06-11 MED ORDER — BACLOFEN 10 MG PO TABS: 30.0000 mg | ORAL_TABLET | Freq: Three times a day (TID) | ORAL | Status: DC

## 2023-06-11 MED ORDER — BACLOFEN 10 MG PO TABS
20.0000 mg | ORAL_TABLET | Freq: Four times a day (QID) | ORAL | Status: DC
  Administered 2023-06-11 – 2023-06-13 (×9): 20 mg via ORAL
  Filled 2023-06-11 (×10): qty 2

## 2023-06-11 MED ORDER — IOHEXOL 350 MG/ML SOLN
75.0000 mL | Freq: Once | INTRAVENOUS | Status: AC | PRN
  Administered 2023-06-11: 75 mL via INTRAVENOUS

## 2023-06-11 NOTE — Progress Notes (Addendum)
I was notified by Dr. Berline Chough this morning of concerns for worsening neurologic examination.  The patient has had progressively worsening spasticity in his lower extremities causing him difficulty with working with rehab.  He did initially walk to the restroom on postoperative day 1 with assistance.  Since then the wife feels as though he has had decline in his spasticity has significantly increased causing him difficulty standing or walking.  He also complains of some intermittent numbness and tingling in his hands.  He also complains of episodes of not feeling when he urinates or has a bowel movement.  He he states he did have episodes like this preoperatively but this is more frequent.  On examination:  His upper extremity strength remains 4+/5 throughout. In his lower extremities, HF 2/5 BL, KE right 3/5, left 2/5, DF/PF right 3/5, left 2/5 Significantly increased tone and intermittent spasms in the lower extremities.  His neck is soft, trachea is midline, nonlabored breathing, normal phonation  He is awake and alert and communicative.  His speech is fluent.   Imaging: MRI C-spine reviewed, there is significant motion degradation again and there remains to be artifactual and difficulty reading stenosis at C5-6 C6-7 and C7-T1.  There is remainder of T2 cord signal change at C7-T1 that does not appear drastically changed.  I do not see any obvious epidural hematoma or compressive lesion.  MRI T-spine does not show any significant stenosis.  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  I will obtain a CT myelogram for further evaluation.  I updated the wife at bedside and answered all of her questions.  Her and the patient agree with this plan of action.  We discussed if there is residual stenosis that a  posterior cervical decompression and fusion may be warranted.  I will update them once imaging is completed.   Thank you for allowing me to participate in this patient's care.  Please do not hesitate to call with questions or concerns.   Monia Pouch, DO Neurosurgeon [facility] [phone]

## 2023-06-11 NOTE — Progress Notes (Signed)
PROGRESS NOTE   Subjective/Complaints:  Had BM multiple times yesterday- had no sensation was having BM- needed brief.   Also peed 1x and didn't feel it, but otherwise requiring in/out caths- Flomax started last evening.   Spasms are less frequent, but more intense- esp in his thighs- they "get firm" and then will relax. Asking if can walk.  Sore throat better. Neck still painful esp if coughs.  Now having tingling in LUE again- hasn't had since surgery.     ROS:  Pt denies SOB, abd pain, CP, N/V/C/D, and vision changes  Negative except HPI Objective:   No results found.  Recent Labs    06/10/23 0520  WBC 7.6  HGB 11.1*  HCT 30.4*  PLT 286   Recent Labs    06/10/23 0520  NA 137  K 3.9  CL 104  CO2 27  GLUCOSE 103*  BUN 7  CREATININE 0.56*  CALCIUM 9.3    Intake/Output Summary (Last 24 hours) at 06/11/2023 3474 Last data filed at 06/11/2023 0647 Gross per 24 hour  Intake 237 ml  Output 1375 ml  Net -1138 ml        Physical Exam: Vital Signs Blood pressure 104/72, pulse (!) 103, temperature 97.8 F (36.6 C), resp. rate 16, height 5\' 9"  (1.753 m), weight 93 kg, SpO2 100%.     General: awake, alert, appropriate, NAD HENT: conjugate gaze; oropharynx moist CV: regular rhythm, mildly tachycardic  rate; no JVD Pulmonary: CTA B/L;  a few upper airways sounds GI: soft, NT, ND, (+)BS- normoactive Psychiatric: flat, frustrated appropriately about Sx's.  Neurological: Ox3 MAS of 3-4 in LE's- fewer spasms seen; clonus ~ 3 beats not 10 beats; still has extensor tone Neurological:        Fairly alert and oriented x 3. Normal insight and awareness. Intact Memory. Normal language and speech. Cranial nerve exam unremarkable. MMT: RUE 5/5. LUE 5/5 deltoid, biceps, 4+ triceps, wrist extensors, hand intrinsics. Pt appears to be able to move all muscle both lower extremities but MMT remains inhibited by  spasticity. Pt with decreased LT in finger tips and from inguinal region downard. DTR's 3+, Tone MAS 3 in LE's   Assessment/Plan: 1. Functional deficits which require 3+ hours per day of interdisciplinary therapy in a comprehensive inpatient rehab setting. Physiatrist is providing close team supervision and 24 hour management of active medical problems listed below. Physiatrist and rehab team continue to assess barriers to discharge/monitor patient progress toward functional and medical goals  Care Tool:  Bathing    Body parts bathed by patient: Right arm, Left arm, Chest, Abdomen, Front perineal area, Right upper leg, Left upper leg, Face   Body parts bathed by helper: Left lower leg, Right lower leg, Buttocks     Bathing assist Assist Level: Moderate Assistance - Patient 50 - 74% (based on simulated task and his inability to demonstrate come from sit to stand for LB task performance.)     Upper Body Dressing/Undressing Upper body dressing   What is the patient wearing?: Pull over shirt    Upper body assist Assist Level: Set up assist (using modified technique)    Lower Body Dressing/Undressing Lower  body dressing      What is the patient wearing?: Pants, Incontinence brief     Lower body assist Assist for lower body dressing: Total Assistance - Patient < 25%     Toileting Toileting    Toileting assist Assist for toileting: Total Assistance - Patient < 25%     Transfers Chair/bed transfer  Transfers assist  Chair/bed transfer activity did not occur: Safety/medical concerns (unsafe to get up)  Chair/bed transfer assist level: 2 Helpers     Locomotion Ambulation   Ambulation assist   Ambulation activity did not occur: Safety/medical concerns          Walk 10 feet activity   Assist  Walk 10 feet activity did not occur: Safety/medical concerns        Walk 50 feet activity   Assist Walk 50 feet with 2 turns activity did not occur: Safety/medical  concerns         Walk 150 feet activity   Assist           Walk 10 feet on uneven surface  activity   Assist Walk 10 feet on uneven surfaces activity did not occur: Safety/medical concerns         Wheelchair     Assist Is the patient using a wheelchair?: Yes (was not safe to attempt transferring into w/c at this time, but yes will use w/c) Type of Wheelchair: Manual Wheelchair activity did not occur: Safety/medical concerns         Wheelchair 50 feet with 2 turns activity    Assist    Wheelchair 50 feet with 2 turns activity did not occur: Safety/medical concerns       Wheelchair 150 feet activity     Assist  Wheelchair 150 feet activity did not occur: Safety/medical concerns       Blood pressure 104/72, pulse (!) 103, temperature 97.8 F (36.6 C), resp. rate 16, height 5\' 9"  (1.753 m), weight 93 kg, SpO2 100%.  Medical Problem List and Plan: 1. Functional deficits secondary to chronic C6-T1 cervical myelopathy s/p ACDF 06/05/2023             -patient may shower with collar on. Will order replacement pads             -ELOS/Goals: 18-24 days, goals are mod I to supervision with self-care and mobility   Admit to CIR- first day of evaluations- difficulty tolerating due to lack of sleep as well severe spasticity  - needed significant titration of spasticity agents  Con't CIR PT and OT Team conference today to determine length of stay  -D/W Dr Dawley- [facility]- he needs MRI of cervical and thoracic without contrast- ordered STAT- also having new tingling in LUE which was there before surgery.  2.  Antithrombotics: -DVT/anticoagulation:  Pharmaceutical: 12/15- Lovenox 2x/day  -but held today due to hematuria- Dopplers negative 12/17- held Lovenox for pt today/stopped it, just in case he needs surgery?             -antiplatelet therapy: none   3. Pain Management: Tylenol as needed;   Norco 5-10 mg q 4 hours prn             - see spasticity  discussion below  12/15- c/o neck pain- not sure if meds enough- if doesn't improve by tomorrow, can increase to Oxycodone if need be.   12/16- didn't complain of neck pain today   -is having some neuro-sedation from meds 4. Mood/Behavior/Sleep: LCSW to evaluate  and provide emotional support             -antipsychotic agents: n/a   5. Neuropsych/cognition: This patient is capable of making decisions on his own behalf.   6. Skin/Wound Care: Routine skin care checks   7. Fluids/Electrolytes/Nutrition: Routine Is and Os and follow-up chemistries   8. Significant LE spasticity:             -increased baclofen to 20mg  q8 hours today. Pt states that it's helping but that spasticity increases over the hour or so prior to the subsequent dose. Consider increasing to q6 hours over weekend BP/SE permitting             -valium 5mg  q6 prn             -rom, activity with PT  12/14- spasticity still severe- no improvement with "robaxin, flexeril"- will change Valium to 5 mg q6 Hours- and see if can tolerate- since more spasms, than tone- and added Zanaflex 4 mg q6 hours prn- for more spasms.   12/16--tone present and still painful for him. It is better than last week, however. Will hold on further adjustment in meds regardless as he is feeling some disorientation   -may need to reduce valium.   -consider increasing baclofen to q6  12/17- changed Baclofen to 20 mg q6 hours since having cognitive changes due ot meds 9. Neurogenic bowel:             -he is continent             -last bm yesterday             -senna-s 2 at bedtime, observe for pattern  12/14- LBM 2 days ago- likely constipation in setting of neurogenic bowel- if no BM by tomorrow, will intervene  12/15- Ordered Sorbitol for bowels- 30cc after therapy- also suppository if no BM. Increased Senna to 2 tabs BID from daily since not having regular BM's  12/16 having flatus, still no bm (despite 12/15 on "last bm date")   -60 cc sorbitol  today   -SSE in PM if still no bm  12/17- had multiple Bms yesterday, but all incontinent- might need bowel program, but wil wait til see MRI's 10. Neurogenic bladder:             -pt with frequency, generally continent. He does have some incontinence at night.             -unsure of PVR's.             -begin timed voids while awake             -check PVR's  12/14- ordered bladder scans- q6 hours- to make sure no problems with retention- will order for 3 days-  12/15- only Bladder scan is 113cc- so retaining some. Will con't to follow  11. Hematuria  12/15- blood tinged urine and bleeding from meatus- will check U/A and Cx- told nursing to collect urine ASAP and let me know when back- of note, no trauma per pt/wife, no caths.   12/16 still some blood, ua neg, ucx pending, hgb with sl drop to 11.1   -likely d/t mild trauma during IC's.    -use coude cath   -lovenox held   -will add flomax. Hopefully bp can support  12/17- stopped Lovenox for now- and flomax started yesterday- had 1 incontinent void yesterday- didn't feel it- concerned about pt's worsening of B/B.  12. Cough  12/15-  sounds like upper airway sounds on exam- will add Mucinex 600 mg BID and cough drops prn.      I spent a total of  59  minutes on total care today- >50% coordination of care- due to  D/w Dr Jake Samples, MRI's done; d/w pt and wife prolonged about B/B and spasticity- and mgmt- also went back to see pt to tell him getting MRI'salso team conference to determine length of stay   LOS: 4 days A FACE TO FACE EVALUATION WAS PERFORMED  Jameela Michna 06/11/2023, 8:22 AM

## 2023-06-11 NOTE — Progress Notes (Addendum)
Patient ID: Leon Rodriguez, male   DOB: Oct 23, 1965, 57 y.o.   MRN: [id number]  SW went by pt room to prpvideupdates but pt sleeping. SW provided updates to pt wife and family member in room on team conference, and d/c 1/11. SW suggested pt wife return to work now if possible, as continued support will be needed once he is ready for discharge.  Wife reported concerns about pt being on NPO and no updates as of yet. She is aware SW will provide updates next week after team conference.   SW faxed PCS referral to [facility] Case Management Dept [phone].  [name], MSW, LCSW Office: [phone] Cell: [phone] Fax: [phone]

## 2023-06-11 NOTE — Progress Notes (Signed)
Physical Therapy Session Note  Patient Details  Name: Leon Rodriguez MRN: [id number] Date of Birth: 27-Jul-1965  Today's Date: 06/11/2023 PT Individual Time: 0800-0900 PT Individual Time Calculation (min): 60 min  and Today's Date: 06/11/2023 PT Missed Time: 75 Minutes Missed Time Reason: Patient fatigue;Other (Comment) (pt sleeping soundly)  Short Term Goals: Week 1:  PT Short Term Goal 1 (Week 1): Pt will perform supine<>sit with max A of 1 consistently PT Short Term Goal 2 (Week 1): Pt will perform sit<>stands using LRAD with mod A of PT Short Term Goal 3 (Week 1): Pt will perform bed<>chair transfers using LRAD with mod A of 1 and +2 for safety as needed PT Short Term Goal 4 (Week 1): Pt will ambulate at least 95ft using LRAD with mod A of 1 and +2 as needed  Skilled Therapeutic Interventions/Progress Updates:    Session 1: pt received in bed and agreeable to therapy. Pt reports pain in hips, BLE, and neck, with the spasms in BLE up to 10/10. premedicated. Rest and positioning provided as needed.   Pt continues to demo extensive tone in BLE limiting therapy. Attempted passive stretching, but did note any improvement in tone after stretching. Max A +2 required for supine>sit d/t tone.   Pt participated in standing in stedy for benefit of weight bearing on tone management. Set up with towel roll between knees and ankles, gait belt around ankles to prevent flexion tone. Mod-max a x 2 to stand to stedy. Demoes minimal glute activation to support upright trunk, but able to feel BLE working in standing despite tone. Attempted several variations to improve muscle activation and trunk control/balance. Pt returned to bed after session with max +2 to await MRI. Pt was left with all needs in reach and alarm active.     Session 2: Pt seen at scheduled time. Consulted with nsg, unsure of plan d/t NPO order placed but no communication from medical team at this. Pt's wife is tearful and with  questions this therapist is currently unable to answer. Provided support as able. Pt is sleeping soundly and unable to rouse enough for meaningful therapy at this time, pt was left with all needs in reach and alarm active.   Therapy Documentation Precautions:  Precautions Precautions: Fall, Cervical, Other (comment) Precaution Comments: hx of many falls, severe B LE hypertonia with muscle spasms Required Braces or Orthoses: Cervical Brace Cervical Brace: Hard collar (to be worn out of bed) Restrictions Weight Bearing Restrictions Per Provider Order: No General: PT Amount of Missed Time (min): 75 Minutes PT Missed Treatment Reason: Patient fatigue;Other (Comment) (pt sleeping soundly)    Therapy/Group: Individual Therapy  Juluis Rainier 06/11/2023, 2:14 PM

## 2023-06-11 NOTE — Progress Notes (Signed)
Occupational Therapy Session Note  Patient Details  Name: Leon Rodriguez MRN: [id number] Date of Birth: 1965-12-27  Today's Date: 06/11/2023 OT Individual Time: 3557-3220 OT Individual Time Calculation (min): 20 min  and Today's Date: 06/11/2023 OT Missed Time: 55 Minutes Missed Time Reason: CT/MRI   Short Term Goals: Week 2:  OT Short Term Goal 1 (Week 2): The pt will improve core strength to 4-/5 MMT OT Short Term Goal 2 (Week 2): The pt will increase activity tolerance to good @ 95% safe while adhering to all precautionary measures. OT Short Term Goal 3 (Week 2): The pt will complete LB dressing with Mod A at bed LOF@ 95% safe OT Short Term Goal 4 (Week 2): The pt will complete sit to stand at [facility] at 95% safe  Skilled Therapeutic Interventions/Progress Updates:    Pt sleeping upon arrival with SO present. Pt easily aroused. BLE spasms "better" but pt continues to exhibit increased BLE tone. Gentle stretching provided with some relief. Pt reported seeing "black spots" and RN notified. Pt scheduled for MRI. Pt missed 60 mins skilled OT services 2/2 MRI and change in medical status. SO present at bedside.  Therapy Documentation Precautions:  Precautions Precautions: Fall, Cervical, Other (comment) Precaution Comments: hx of many falls, severe B LE hypertonia with muscle spasms Required Braces or Orthoses: Cervical Brace Cervical Brace: Hard collar (to be worn out of bed) Restrictions Weight Bearing Restrictions Per Provider Order: No General: General OT Amount of Missed Time: 55 Minutes   Pain: Pt c/o ongoing BLE spasms and "tightness"; meds admin prior to therapy  Therapy/Group: Individual Therapy  Rich Brave 06/11/2023, 9:50 AM

## 2023-06-11 NOTE — Patient Care Conference (Signed)
Inpatient RehabilitationTeam Conference and Plan of Care Update Date: 06/11/2023   Time: 11:03 AM    Patient Name: Leon Rodriguez      Medical Record Number: [id number]  Date of Birth: 1965/08/19 Sex: Male         Room/Bed: 4M02C/4M02C-01 Payor Info: Payor: [place] MEDICAID PREPAID HEALTH PLAN / Plan: [place] MEDICAID HEALTHY BLUE / Product Type: *No Product type* /    Admit Date/Time:  06/07/2023  3:42 PM  Primary Diagnosis:  Cervical myelopathy [facility])  Hospital Problems: Principal Problem:   Cervical myelopathy [facility])    Expected Discharge Date: Expected Discharge Date: 07/05/23  Team Members Present: Physician leading conference: Dr. Genice Rouge Social Worker Present: [name], LCSWA Nurse Present: Chana Bode, RN PT Present: Bernie Covey, PT OT Present: Ardis Rowan, COTA;Jennifer Katrinka Blazing, OT PPS Coordinator present : Fae Pippin, SLP     Current Status/Progress Goal Weekly Team Focus  Bowel/Bladder   pt is incontient of b/b.q6 caths for no void.   Regain continence   Assist with time toileting    Swallow/Nutrition/ Hydration               ADL's   max A/tot A BADLs; extreme extensor and adductor tone; transfers with Stedy   bathing-mod I; dressing-min A; transfers-min A; standing balance-CGA   tone/pain mgmt, BADLs, transfers    Mobility   max a overall, +2 for most transfers d/t extreme extensor and adductor tone.   CGA short distance gait and transfers, mod I w/c, min a stairs  tone management    Communication                Safety/Cognition/ Behavioral Observations               Pain   pt c/o neck pain. 7/10 and muscle spasms in the bilateral LE. prn/scheduled pain meds and muscle relaxers given   <3 pain score   Assess qshift and prn    Skin   Incision with steri strips anterior neck.   Maintain skin intergrity  Assess qshift and prn      Discharge Planning:  D/c to home with s/o [name]. She is the only caregiver. Reports her  dtr can hekp PRN some evenings. Pt needs to be Mod I/intermittent supervision at discharge as she intends to return to work after Jan 15. SW will confirm there are no barriers to discharge.   Team Discussion: Patient post cervical myelopathy with post op neurological changes, increased tone and spasms/spasticity intensified.  Patient on target to meet rehab goals: Currently limited in participation with therapy and limited by neurogenic bowel and bladder.  *See Care Plan and progress notes for long and short-term goals.   Revisions to Treatment Plan:  Neurosurgeon consulted MRI of cervical/thoracic area Baclofen dose adjusted per MD Baclofen pump consult  Teaching Needs: Safety, bowel and bladder management program, intermittent catheterization, pressure relief, transfers, toileting, etc.   Current Barriers to Discharge: Decreased caregiver support, Home enviroment access/layout, and Neurogenic bowel and bladder Self limiting behavior  Possible Resolutions to Barriers: Family education Disability application     Medical Summary Current Status: SCI getting worse-  needing cathing- now and not walking now- and neurogenic bowel- -doesn't want to do for himself- spasticity is horrible  Barriers to Discharge: Behavior/Mood;Neurogenic Bowel & Bladder;Self-care education;Spasticity;Medical stability;Weight bearing restrictions;Uncontrolled Pain  Barriers to Discharge Comments: limited by severe spasticity, Neurogenic bowel and bladder getting worse- and self limting- goals all over the place- would meet criteria  for disability- working for supervision if possible- is max A Possible Resolutions to [employer] Focus: MRI of cervical and thoracic spine- added Flomax for bladder probable will need to add Bowel program- no orthostaiss- but at risk; d/c- 4 weeks- ~ 07/05/23   Continued Need for Acute Rehabilitation Level of Care: The patient requires daily medical management by a physician  with specialized training in physical medicine and rehabilitation for the following reasons: Direction of a multidisciplinary physical rehabilitation program to maximize functional independence : Yes Medical management of patient stability for increased activity during participation in an intensive rehabilitation regime.: Yes Analysis of laboratory values and/or radiology reports with any subsequent need for medication adjustment and/or medical intervention. : Yes   I attest that I was present, lead the team conference, and concur with the assessment and plan of the team.   Chana Bode B 06/11/2023, 9:35 PM

## 2023-06-12 ENCOUNTER — Inpatient Hospital Stay (HOSPITAL_COMMUNITY): Payer: Medicaid Other

## 2023-06-12 DIAGNOSIS — G959 Disease of spinal cord, unspecified: Secondary | ICD-10-CM | POA: Diagnosis not present

## 2023-06-12 LAB — CBC WITH DIFFERENTIAL/PLATELET
Abs Immature Granulocytes: 0.02 10*3/uL (ref 0.00–0.07)
Basophils Absolute: 0 10*3/uL (ref 0.0–0.1)
Basophils Relative: 0 %
Eosinophils Absolute: 0 10*3/uL (ref 0.0–0.5)
Eosinophils Relative: 0 %
HCT: 31.7 % — ABNORMAL LOW (ref 39.0–52.0)
Hemoglobin: 11.3 g/dL — ABNORMAL LOW (ref 13.0–17.0)
Immature Granulocytes: 0 %
Lymphocytes Relative: 5 %
Lymphs Abs: 0.4 10*3/uL — ABNORMAL LOW (ref 0.7–4.0)
MCH: 34.1 pg — ABNORMAL HIGH (ref 26.0–34.0)
MCHC: 35.6 g/dL (ref 30.0–36.0)
MCV: 95.8 fL (ref 80.0–100.0)
Monocytes Absolute: 0.3 10*3/uL (ref 0.1–1.0)
Monocytes Relative: 4 %
Neutro Abs: 7.3 10*3/uL (ref 1.7–7.7)
Neutrophils Relative %: 91 %
Platelets: 292 10*3/uL (ref 150–400)
RBC: 3.31 MIL/uL — ABNORMAL LOW (ref 4.22–5.81)
RDW: 12.4 % (ref 11.5–15.5)
WBC: 8 10*3/uL (ref 4.0–10.5)
nRBC: 0 % (ref 0.0–0.2)

## 2023-06-12 LAB — COMPREHENSIVE METABOLIC PANEL WITH GFR
ALT: 49 U/L — ABNORMAL HIGH (ref 0–44)
AST: 58 U/L — ABNORMAL HIGH (ref 15–41)
Albumin: 3 g/dL — ABNORMAL LOW (ref 3.5–5.0)
Alkaline Phosphatase: 106 U/L (ref 38–126)
Anion gap: 9 (ref 5–15)
BUN: 10 mg/dL (ref 6–20)
CO2: 26 mmol/L (ref 22–32)
Calcium: 9.5 mg/dL (ref 8.9–10.3)
Chloride: 102 mmol/L (ref 98–111)
Creatinine, Ser: 0.8 mg/dL (ref 0.61–1.24)
GFR, Estimated: >60 mL/min
Glucose, Bld: 167 mg/dL — ABNORMAL HIGH (ref 70–99)
Potassium: 3.8 mmol/L (ref 3.5–5.1)
Sodium: 137 mmol/L (ref 135–145)
Total Bilirubin: 0.9 mg/dL
Total Protein: 6 g/dL — ABNORMAL LOW (ref 6.5–8.1)

## 2023-06-12 LAB — D-DIMER, QUANTITATIVE: D-Dimer, Quant: 2.68 ug{FEU}/mL — ABNORMAL HIGH (ref 0.00–0.50)

## 2023-06-12 LAB — PROCALCITONIN: Procalcitonin: 0.91 ng/mL

## 2023-06-12 LAB — LACTIC ACID, PLASMA
Lactic Acid, Venous: 2.4 mmol/L (ref 0.5–1.9)
Lactic Acid, Venous: 2 mmol/L (ref 0.5–1.9)

## 2023-06-12 MED ORDER — IOHEXOL 300 MG/ML  SOLN: 10.0000 mL | Freq: Once | INTRAMUSCULAR | Status: DC | PRN

## 2023-06-12 MED ORDER — LIDOCAINE HCL (PF) 1 % IJ SOLN
5.0000 mL | Freq: Once | INTRAMUSCULAR | Status: AC
Start: 2023-06-12 — End: 2023-06-12
  Administered 2023-06-12: 5 mL via INTRADERMAL

## 2023-06-12 MED ORDER — ACETAMINOPHEN 325 MG PO TABS
325.0000 mg | ORAL_TABLET | ORAL | Status: DC | PRN
  Administered 2023-06-12: 650 mg via ORAL
  Administered 2023-06-13: 325 mg via ORAL
  Administered 2023-06-13: 650 mg via ORAL
  Administered 2023-06-13: 325 mg via ORAL
  Filled 2023-06-12 (×4): qty 2

## 2023-06-12 MED ORDER — LORAZEPAM 0.5 MG PO TABS
0.5000 mg | ORAL_TABLET | Freq: Once | ORAL | Status: AC | PRN
  Administered 2023-06-13: 0.5 mg via ORAL
  Filled 2023-06-12: qty 1

## 2023-06-12 MED ORDER — SODIUM CHLORIDE 0.9 % IV SOLN: Freq: Once | INTRAVENOUS | Status: AC

## 2023-06-12 MED ORDER — BISACODYL 10 MG RE SUPP
10.0000 mg | Freq: Every day | RECTAL | Status: DC
  Administered 2023-06-12: 10 mg via RECTAL
  Filled 2023-06-12 (×3): qty 1

## 2023-06-12 NOTE — Progress Notes (Signed)
Occupational Therapy Session Note  Patient Details  Name: Leon Rodriguez MRN: [id number] Date of Birth: 09-Jun-1966  Today's Date: 06/12/2023 OT Individual Time: 1045-1200 OT Individual Time Calculation (min): 75 min    Short Term Goals: Week 1:   see week 2 goals in STG tab  Skilled Therapeutic Interventions/Progress Updates:      Therapy Documentation Precautions:  Precautions Precautions: Fall, Cervical, Other (comment) Precaution Comments: hx of many falls, severe B LE hypertonia with muscle spasms Required Braces or Orthoses: Cervical Brace Cervical Brace: Hard collar (to be worn out of bed) Restrictions Weight Bearing Restrictions Per Provider Order: No General: "Now you're a pro!" Pt supine in bed upon OT arrival, agreeable to OT session.  Pain: no pain reported  ADL: Pt requesting to complete grooming at bed level to shave face and head. Pt supine with head support with OT providing assistance with shaving d/t cervical precautions. Pt able to assist with shaving fine areas without moving head/neck to maintain cervical precautions with Min VC. OT keeping posterior part of aspen collar on to shave anterior part of face and vice versa. Pt presented with increased mood at end of session, smiling and laughing when prior pt flat affect.  Exercises: Pt issued blue sponge and pink putty with beads imbedded in putty in order to increase [facility] and dexterity and increase hand musculature. Pt given HEP for sponge exercises to complete in room or at home.   Pt supine in bed with bed alarm activated, 2 bed rails up, call light within reach and 4Ps assessed.   Therapy/Group: Individual Therapy  Velia Meyer, OTD, OTR/L 06/12/2023, 3:54 PM

## 2023-06-12 NOTE — Progress Notes (Signed)
Patient is A&Ox3, temperature 100.1+, and tachycardic. No c/o of pain, nausea, but is experiencing shakiness which wife states has been on-going.   PA notified.

## 2023-06-12 NOTE — Progress Notes (Signed)
CT cervical spine with contrast reviewed.  This was performed with IV contrast   There is no bony residual stenosis at C6-7 with C7-T1.  Hardware appears to be in good placement.     I called and updated his wife about the CT findings.  I do believe it would still be of benefit to repeat attempt at a CT myelogram as he did not tolerated today became diaphoretic.       We will try it tomorrow with pretreatment with Valium.

## 2023-06-12 NOTE — Progress Notes (Signed)
PROGRESS NOTE   Subjective/Complaints:  Pt still having incontinent stools- per wife, was using suppositories at home regularly due to incontinence at home.   Slept better last night per pt, but didn't want to wake up to talk to me much this AM.  D/w wife that can start nightly suppository- even though he's refused in past, I think its needed to control his stools   ROS:  Limited by sleepiness- didn't want to wake up  Negative except HPI Objective:   CT CERVICAL SPINE W CONTRAST Result Date: 06/12/2023 CLINICAL DATA:  Cervical spinal stenosis EXAM: CT CERVICAL SPINE WITH CONTRAST TECHNIQUE: Multidetector CT imaging of the cervical spine was performed during intravenous contrast administration. Multiplanar CT image reconstructions were also generated. RADIATION DOSE REDUCTION: This exam was performed according to the departmental dose-optimization program which includes automated exposure control, adjustment of the mA and/or kV according to patient size and/or use of iterative reconstruction technique. CONTRAST:  75mL OMNIPAQUE IOHEXOL 350 MG/ML SOLN COMPARISON:  None Available. FINDINGS: Alignment: Normal. Skull base and vertebrae: Multilevel vertebral body height loss throughout the cervical spine. Flowing anterior osteophytes along the length of the cervical spine. C6-T1 ACDF. Soft tissues and spinal canal: Postsurgical changes of left anterior approach for cervical spine fusion. Low-attenuation collection ventral to the spinal column at the C6-T2 levels. Disc levels:  No high-grade bony spinal canal stenosis. Upper chest: Negative Other: None. IMPRESSION: 1. Postsurgical changes of left anterior approach for cervical spine fusion. Low-attenuation collection ventral to the spinal column at the C6-T2 levels is nonspecific, without specific features of infection. 2. No high-grade bony spinal canal stenosis. Electronically Signed   By: Deatra Robinson M.D.   On: 06/12/2023 02:26   MR CERVICAL SPINE WO CONTRAST Result Date: 06/11/2023 CLINICAL DATA:  Spinal cord injury, follow up; Spinal cord injury, follow up New SCI with increasing spasticity, new neurogenic bowel and bladder since surgery EXAM: MRI CERVICAL AND THORACIC SPINE WITHOUT CONTRAST TECHNIQUE: Multiplanar and multiecho pulse sequences of the cervical spine, to include the craniocervical junction and cervicothoracic junction, and the thoracic spine, were obtained without intravenous contrast. COMPARISON:  MR C SPine 05/10/23 FINDINGS: Limitations: Limited exam due to the degree of motion artifact. MRI CERVICAL SPINE FINDINGS Alignment: Physiologic. Vertebrae: Status post C5-C7 ACDF. The degree of motion artifact markedly limits the ability to assess for the presence of acute osseous abnormality or hardware failure. There is prevertebral soft tissue edema of the C7-T3 levels, favored to be postsurgical. Cord: There is persistent T2 hyperintense signal abnormality within the spinal cord at the C7-T1 level. Posterior Fossa, vertebral arteries, paraspinal tissues: Prevertebral soft tissue edema of the C6-T3 levels is favored to be postsurgical. There is likely a small hematoma in the left lateral (series [id number]. Disc levels: The degree of motion artifact precludes detailed characterization of the degree of spinal canal stenosis. There is persistent severe spinal canal stenosis of the C6-T1 levels. MRI THORACIC SPINE FINDINGS Alignment:  Physiologic. Vertebrae: No fracture, evidence of discitis, or bone lesion. Cord:  Normal signal and morphology. Paraspinal and other soft tissues: Small amount of layering fluid in the esophagus, which places the patient at risk for aspiration. There  is a posterior mediastinal hematoma which likely causes mass effect on the posterior esophagus. Correlate for symptoms of dysphagia. Disc levels: There are multilevel small degenerative disc bulges in the thoracic  spine, most notably at T6-T7, T7-T8, T11-T12, T12-L1. There is likely moderate spinal canal narrowing at T6-T7. Likely severe right and moderate left neural foraminal narrowing at T11-T12 IMPRESSION: 1. Limited exam due to the degree of motion artifact. Within this limitation, there is persistent severe spinal canal stenosis of the C6-T1 levels with associated T2 hyperintense cord signal abnormality at the C7-T1 level, unchanged from prior. Repeat exam is recommended if there is concern for progressive stenosis or compressive myelopathy. 2. Posterior mediastinal hematoma which likely causes mass effect on the posterior esophagus. Correlate for symptoms of dysphagia. 3. Prevertebral soft tissue edema of the C6-T3 levels is favored to be postsurgical. There is likely a small hematoma in the left lateral neck. 4. No acute abnormality in the thoracic spine. Thoracic spine degenerative changes, as above. Electronically Signed   By: Lorenza Cambridge M.D.   On: 06/11/2023 12:04   MR THORACIC SPINE WO CONTRAST Result Date: 06/11/2023 CLINICAL DATA:  Spinal cord injury, follow up; Spinal cord injury, follow up New SCI with increasing spasticity, new neurogenic bowel and bladder since surgery EXAM: MRI CERVICAL AND THORACIC SPINE WITHOUT CONTRAST TECHNIQUE: Multiplanar and multiecho pulse sequences of the cervical spine, to include the craniocervical junction and cervicothoracic junction, and the thoracic spine, were obtained without intravenous contrast. COMPARISON:  MR C SPine 05/10/23 FINDINGS: Limitations: Limited exam due to the degree of motion artifact. MRI CERVICAL SPINE FINDINGS Alignment: Physiologic. Vertebrae: Status post C5-C7 ACDF. The degree of motion artifact markedly limits the ability to assess for the presence of acute osseous abnormality or hardware failure. There is prevertebral soft tissue edema of the C7-T3 levels, favored to be postsurgical. Cord: There is persistent T2 hyperintense signal abnormality  within the spinal cord at the C7-T1 level. Posterior Fossa, vertebral arteries, paraspinal tissues: Prevertebral soft tissue edema of the C6-T3 levels is favored to be postsurgical. There is likely a small hematoma in the left lateral (series [id number]. Disc levels: The degree of motion artifact precludes detailed characterization of the degree of spinal canal stenosis. There is persistent severe spinal canal stenosis of the C6-T1 levels. MRI THORACIC SPINE FINDINGS Alignment:  Physiologic. Vertebrae: No fracture, evidence of discitis, or bone lesion. Cord:  Normal signal and morphology. Paraspinal and other soft tissues: Small amount of layering fluid in the esophagus, which places the patient at risk for aspiration. There is a posterior mediastinal hematoma which likely causes mass effect on the posterior esophagus. Correlate for symptoms of dysphagia. Disc levels: There are multilevel small degenerative disc bulges in the thoracic spine, most notably at T6-T7, T7-T8, T11-T12, T12-L1. There is likely moderate spinal canal narrowing at T6-T7. Likely severe right and moderate left neural foraminal narrowing at T11-T12 IMPRESSION: 1. Limited exam due to the degree of motion artifact. Within this limitation, there is persistent severe spinal canal stenosis of the C6-T1 levels with associated T2 hyperintense cord signal abnormality at the C7-T1 level, unchanged from prior. Repeat exam is recommended if there is concern for progressive stenosis or compressive myelopathy. 2. Posterior mediastinal hematoma which likely causes mass effect on the posterior esophagus. Correlate for symptoms of dysphagia. 3. Prevertebral soft tissue edema of the C6-T3 levels is favored to be postsurgical. There is likely a small hematoma in the left lateral neck. 4. No acute abnormality in the thoracic spine.  Thoracic spine degenerative changes, as above. Electronically Signed   By: Lorenza Cambridge M.D.   On: 06/11/2023 12:04    Recent Labs     06/10/23 0520  WBC 7.6  HGB 11.1*  HCT 30.4*  PLT 286   Recent Labs    06/10/23 0520  NA 137  K 3.9  CL 104  CO2 27  GLUCOSE 103*  BUN 7  CREATININE 0.56*  CALCIUM 9.3    Intake/Output Summary (Last 24 hours) at 06/12/2023 1545 Last data filed at 06/12/2023 1300 Gross per 24 hour  Intake 540 ml  Output 2215 ml  Net -1675 ml        Physical Exam: Vital Signs Blood pressure 112/70, pulse (!) 108, temperature 98.5 F (36.9 C), resp. rate 16, height 5\' 9"  (1.753 m), weight 93 kg, SpO2 100%.     General: asleep -woke briefly when wife woke, but only said slept better;  NAD HENT: conjugate gaze; oropharynx moist CV: regular rhythm; tachycardic  rate; no JVD Pulmonary: CTA B/L; no W/R/R- good air movement GI: soft, NT, ND, (+)BS- slightly protuberant Psychiatric: awoke briefly- sleepy Neurological: awoke briefly  MAS of 3-4 in LE's- fewer spasms still- but significant extensor tone Neurological:        Fairly alert and oriented x 3. Normal insight and awareness. Intact Memory. Normal language and speech. Cranial nerve exam unremarkable. MMT: RUE 5/5. LUE 5/5 deltoid, biceps, 4+ triceps, wrist extensors, hand intrinsics. Pt appears to be able to move all muscle both lower extremities but MMT remains inhibited by spasticity. Pt with decreased LT in finger tips and from inguinal region downard. DTR's 3+, Tone MAS 3 in LE's   Assessment/Plan: 1. Functional deficits which require 3+ hours per day of interdisciplinary therapy in a comprehensive inpatient rehab setting. Physiatrist is providing close team supervision and 24 hour management of active medical problems listed below. Physiatrist and rehab team continue to assess barriers to discharge/monitor patient progress toward functional and medical goals  Care Tool:  Bathing    Body parts bathed by patient: Right arm, Left arm, Chest, Abdomen, Front perineal area, Right upper leg, Left upper leg, Face   Body parts  bathed by helper: Left lower leg, Right lower leg, Buttocks     Bathing assist Assist Level: Moderate Assistance - Patient 50 - 74% (based on simulated task and his inability to demonstrate come from sit to stand for LB task performance.)     Upper Body Dressing/Undressing Upper body dressing   What is the patient wearing?: Pull over shirt    Upper body assist Assist Level: Set up assist (using modified technique)    Lower Body Dressing/Undressing Lower body dressing      What is the patient wearing?: Pants, Incontinence brief     Lower body assist Assist for lower body dressing: Total Assistance - Patient < 25%     Toileting Toileting    Toileting assist Assist for toileting: Total Assistance - Patient < 25%     Transfers Chair/bed transfer  Transfers assist  Chair/bed transfer activity did not occur: Safety/medical concerns (unsafe to get up)  Chair/bed transfer assist level: 2 Helpers     Locomotion Ambulation   Ambulation assist   Ambulation activity did not occur: Safety/medical concerns          Walk 10 feet activity   Assist  Walk 10 feet activity did not occur: Safety/medical concerns        Walk 50 feet activity  Assist Walk 50 feet with 2 turns activity did not occur: Safety/medical concerns         Walk 150 feet activity   Assist           Walk 10 feet on uneven surface  activity   Assist Walk 10 feet on uneven surfaces activity did not occur: Safety/medical concerns         Wheelchair     Assist Is the patient using a wheelchair?: Yes (was not safe to attempt transferring into w/c at this time, but yes will use w/c) Type of Wheelchair: Manual Wheelchair activity did not occur: Safety/medical concerns         Wheelchair 50 feet with 2 turns activity    Assist    Wheelchair 50 feet with 2 turns activity did not occur: Safety/medical concerns       Wheelchair 150 feet activity     Assist   Wheelchair 150 feet activity did not occur: Safety/medical concerns       Blood pressure 112/70, pulse (!) 108, temperature 98.5 F (36.9 C), resp. rate 16, height 5\' 9"  (1.753 m), weight 93 kg, SpO2 100%.  Medical Problem List and Plan: 1. Functional deficits secondary to chronic C6-T1 cervical myelopathy s/p ACDF 06/05/2023             -patient may shower with collar on. Will order replacement pads             -ELOS/Goals: 18-24 days, goals are mod I to supervision with self-care and mobility   Admit to CIR- first day of evaluations- difficulty tolerating due to lack of sleep as well severe spasticity  - needed significant titration of spasticity agents  D/c 07/05/23  Con't CIR PT and OT  Attempted LP for myelogram today- pt said couldn't tolerate it- was d/c'd. Let NSU know 2.  Antithrombotics: -DVT/anticoagulation:  Pharmaceutical: 12/15- Lovenox 2x/day  -but held today due to hematuria- Dopplers negative 12/17- held Lovenox for pt today/stopped it, just in case he needs surgery?             -antiplatelet therapy: none   3. Pain Management: Tylenol as needed;   Norco 5-10 mg q 4 hours prn             - see spasticity discussion below  12/15- c/o neck pain- not sure if meds enough- if doesn't improve by tomorrow, can increase to Oxycodone if need be.   12/16- didn't complain of neck pain today  12/18- asleep so hard to tell if hurting more 4. Mood/Behavior/Sleep: LCSW to evaluate and provide emotional support             -antipsychotic agents: n/a   5. Neuropsych/cognition: This patient is capable of making decisions on his own behalf.   6. Skin/Wound Care: Routine skin care checks   7. Fluids/Electrolytes/Nutrition: Routine Is and Os and follow-up chemistries   8. Significant LE spasticity:             -increased baclofen to 20mg  q8 hours today. Pt states that it's helping but that spasticity increases over the hour or so prior to the subsequent dose. Consider increasing to  q6 hours over weekend BP/SE permitting             -valium 5mg  q6 prn             -rom, activity with PT  12/14- spasticity still severe- no improvement with "robaxin, flexeril"- will change Valium to 5 mg q6  Hours- and see if can tolerate- since more spasms, than tone- and added Zanaflex 4 mg q6 hours prn- for more spasms.   12/16--tone present and still painful for him. It is better than last week, however. Will hold on further adjustment in meds regardless as he is feeling some disorientation   -may need to reduce valium.   -consider increasing baclofen to q6  12/17- changed Baclofen to 20 mg q6 hours since having cognitive changes due ot meds  12/18- slept better due to less pain/spasms 9. Neurogenic bowel:             -he is continent             -last bm yesterday             -senna-s 2 at bedtime, observe for pattern  12/14- LBM 2 days ago- likely constipation in setting of neurogenic bowel- if no BM by tomorrow, will intervene  12/15- Ordered Sorbitol for bowels- 30cc after therapy- also suppository if no BM. Increased Senna to 2 tabs BID from daily since not having regular BM's  12/16 having flatus, still no bm (despite 12/15 on "last bm date")   -60 cc sorbitol today   -SSE in PM if still no bm  12/17- had multiple Bms yesterday, but all incontinent- might need bowel program, but wil wait til see MRI's  12/18 - started bowel program with suppository since was using at home per wife- will d/w pt more tomorrow- didn't want to wake up this AM 10. Neurogenic bladder:             -pt with frequency, generally continent. He does have some incontinence at night.             -unsure of PVR's.             -begin timed voids while awake             -check PVR's  12/14- ordered bladder scans- q6 hours- to make sure no problems with retention- will order for 3 days-  12/15- only Bladder scan is 113cc- so retaining some. Will con't to follow  11. Hematuria  12/15- blood tinged urine and  bleeding from meatus- will check U/A and Cx- told nursing to collect urine ASAP and let me know when back- of note, no trauma per pt/wife, no caths.   12/16 still some blood, ua neg, ucx pending, hgb with sl drop to 11.1   -likely d/t mild trauma during IC's.    -use coude cath   -lovenox held   -will add flomax. Hopefully bp can support  12/17- stopped Lovenox for now- and flomax started yesterday- had 1 incontinent void yesterday- didn't feel it- concerned about pt's worsening of B/B. 12/18- Pt requiring caths  12. Cough  12/15- sounds like upper airway sounds on exam- will add Mucinex 600 mg BID and cough drops prn.     I spent a total of 45   minutes on total care today- >50% coordination of care- due to  D/w nursing; wife and NSU about CT myelogram- and lack of results.    LOS: 5 days A FACE TO FACE EVALUATION WAS PERFORMED  Sylvestre Rathgeber 06/12/2023, 3:45 PM

## 2023-06-12 NOTE — Plan of Care (Addendum)
PROCEDURE SUMMARY:  Attempted  image-guided lumbar puncture at level of L1-L2 and L2-3.  No CSF return, patient was experiencing dyspnea  and became diaphoretic, he asked the procedure to be terminated.   No immediate complications.  EBL = trace.  Myelogram could NOT be performed.  Attending provider notified.    Lynann Bologna Dayvon Dax PA-C 06/12/2023 1:38 PM

## 2023-06-12 NOTE — Progress Notes (Signed)
Physical Therapy Session Note  Patient Details  Name: Leon Rodriguez MRN: [id number] Date of Birth: Oct 12, 1965  Today's Date: 06/12/2023 PT Individual Time: 0905-1000 and 1445-1510 PT Individual Time Calculation (min): 55 min and 25 min  Short Term Goals: Week 1:  PT Short Term Goal 1 (Week 1): Pt will perform supine<>sit with max A of 1 consistently PT Short Term Goal 2 (Week 1): Pt will perform sit<>stands using LRAD with mod A of PT Short Term Goal 3 (Week 1): Pt will perform bed<>chair transfers using LRAD with mod A of 1 and +2 for safety as needed PT Short Term Goal 4 (Week 1): Pt will ambulate at least 74ft using LRAD with mod A of 1 and +2 as needed  Skilled Therapeutic Interventions/Progress Updates: Pt presented in bed sleeping with wife present. Pt arouseable but required increased noxious stimuli. When provided with wash cloth pt increased arousal and agreeable to therapy. PTA checked with nsg who indicated imaging came up clear and pt was not schedule for surgery. Session remained bed level and focused on stretching and AAROM for tone management. Pt states spasms do not hurt and was actually surprised that he was able to move feet volitionally. PTA performed stretching of hamstring, heel cord, hip adductors, and ROM hip ER/IR. Pt with intermittent spasms and PTA required increased force to position RLE into 90/90 position in bed. Pt was then able to perform ankle pumps unrestricted in BLE, hip/knee extension x 5 (movement of leg press without resistance), and AA hip abd x 5 in  BLE. Pt also was able to use bed rails to pull self up to long sit x 5. Pt required modA initially but was able to progress to minA. Pt required increased time between bouts due to intermittent lethargy with PTA providing wet washcloth and wife encouraging sips of coffee and bits of food. PTA obtained bolster and pt attempted SAQ with pt able to complete near full range on RLE but required minA to complete range  on LLE. At end of session pt repositioned to comfort and promptly fell asleep prior to this therapist leaving room. Pt left in bed at end of session with bed alarm on, wife present and current needs met.   Tx2: PTA entered room with LPN and NT repositioning pt. LPN then advised that pt just returned from LP. No orders currently in chart however per protocol pt not to be moved after LP. No orders in chart for bedrest. After a while discovered LP was unsuccessful, returned to pt's room who was lethargic but agreeable to therapy. Completed supine to sit maxA x 2 and pt able to sit EOB for approx 5 min however required maxA for sitting balance as pt would frequently fall asleep. Remaining session deferred as pt too lethargic to participate in therapy. Pt returned to supine maxA x 2 and repositioned to comfort. Pt was able to use bed rails to pull up trunk to allow for additional pillow placement. Blanket placed between pt's lower legs to minimize pressure due to spasms. Pt left in bed at end of session with bed alarm on, call bell within reach and needs met. Pt missed 55 min skilled PT due to ?LP and lethargy.      Therapy Documentation Precautions:  Precautions Precautions: Fall, Cervical, Other (comment) Precaution Comments: hx of many falls, severe B LE hypertonia with muscle spasms Required Braces or Orthoses: Cervical Brace Cervical Brace: Hard collar (to be worn out of bed) Restrictions Weight Bearing  Restrictions Per Provider Order: No General: PT Amount of Missed Time (min): 50 Minutes PT Missed Treatment Reason: Patient fatigue;Other (Comment) (?LP) Vital Signs:    Therapy/Group: Individual Therapy  Charonda Hefter 06/12/2023, 3:20 PM

## 2023-06-12 NOTE — Consult Note (Addendum)
Initial Consultation Note   Patient: Leon Rodriguez DOB: [birth year] PCP: Kathleen Lime, MD DOA: 06/07/2023 DOS: the patient was seen and examined on 06/12/2023 Primary service: Genice Rouge, MD  Referring physician: Wendi Maya, PA Reason for consult: Fever, tachycardia  Assessment/Plan: Fever/tachycardia: Patient with new fever and sinus tachycardia this evening.  Mentation remains intact, blood pressure stable.  Infectious workup is in process.  He had been on Lovenox for DVT prophylaxis through 12/17, held starting today due to potential need for surgical intervention.  Lactic acid mildly elevated at 2.4.  No leukocytosis noted. -Agree with 1 L normal saline bolus and follow repeat lactic acid level -Follow chest x-ray, blood cultures, urine culture (pending collection) -Follow D-dimer, if significant elevation consider CTA chest PE study -Check COVID, flu, RSV panel -Antibiotics on hold for now pending further workup -Neurosurgery planning on CT myelogram tomorrow Addendum: Chest x-ray negative for focal consolidation, edema, effusion.  D-dimer is elevated.  Will obtain CTA chest PE study.  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  Neurosurgery have been reconsulted, they are reattempting CT myelogram tomorrow to determine need for any further surgical intervention.  [facility] will continue to follow the patient.  HPI:  Leon Rodriguez is a 57 y.o. male with history of cervical spinal stenosis who was admitted on 06/05/2023 by the neurosurgery team.  He underwent ACDF of C6-T1.  Postoperatively he developed myelopathy and lower extremity spasticity.  He was admitted to [facility] on 12/13.  On 12/17 he was noted to have progressively worsening neurologic status.  MRI cervical and thoracic spine was obtained.  This was significantly motion degraded.  Persistent severe spinal canal stenosis of C6-T1 with  associated T2 hyperintense cord signal abnormality at the C7-T1 level was noted, unchanged from prior.  Neurosurgery were reconsulted and patient was seen by Dr. Jake Samples.  They did not see an epidural hematoma or compressive lesion for urgent decompression.  CT myelogram was ordered and attempted earlier today but cannot be performed as patient became dyspneic and diaphoretic during procedure attempt.  CT C-spine with contrast was obtained.  Per neurosurgery no bony residual stenosis at C6-7 with C7-T1.  Hardware appeared to be in good placement.  Neurosurgery recommended repeat attempt at CT myelogram tomorrow with pretreatment with Valium.  This evening patient was noted to be tachycardic with heart rate 140-150s.  EKG showed sinus tachycardia (has not been transferred into epic system).  He was noted to have fever of 103 F.  Patient states that he has not felt feverish but has been experiencing chills and diaphoresis.  He says he has had some cough, mostly nonproductive but states this has subsided today.  He has not had any nausea, vomiting.  He reports numbness in his midsection.  He says he cannot move his legs at all but sensation is intact in his lower extremities.  Labs today have been obtained notable for lactic acid 2.4, AST 58, ALT 49.  Total bilirubin is 0.9.  WBC 8.0, hemoglobin 11.3, platelets 292,000.  Blood cultures, D-dimer, procalcitonin in process.  Urine culture ordered and pending collection.  Review of Systems: As mentioned in the history of present illness. All other systems reviewed and are negative. Past Medical History:  Diagnosis Date   Arthritis    Hip pain    Past Surgical History:  Procedure Laterality Date   ANTERIOR CERVICAL DECOMP/DISCECTOMY FUSION N/A 06/05/2023   Procedure: ANTERIOR CERVICAL DISCECTOMY AND FUSION  CERVICAL SIX-SEVEN/CERVICAL SEVEN-THORACIC ONE;  Surgeon: Dawley, Alan Mulder, DO;  Location: [facility] OR;  Service: Neurosurgery;  Laterality: N/A;  3C    APPENDECTOMY     CIRCUMCISION     HIATAL HERNIA REPAIR     HIP SURGERY     at 21 and 57 yo   Social History:  reports that he has been smoking cigarettes and cigars. He has never used smokeless tobacco. He reports that he does not currently use alcohol. He reports current drug use. Drug: Marijuana.  No Known Allergies  History reviewed. No pertinent family history.  Prior to Admission medications   Medication Sig Start Date End Date Taking? Authorizing Provider  acetaminophen (TYLENOL) 500 MG tablet Take 2 tablets (1,000 mg total) by mouth every 8 (eight) hours as needed for fever. Patient not taking: Reported on 06/03/2023 01/07/23   Faith Rogue, DO  Baclofen 5 MG TABS Take 1 tablet by mouth 3 (three) times daily as needed (muscle spasms). 05/31/23   [provider]  famotidine (ZANTAC 360) 10 MG tablet Take 10 mg by mouth daily as needed for heartburn or indigestion.    [provider]    Physical Exam: Vitals:   06/12/23 0441 06/12/23 1620 06/12/23 2033 06/12/23 2127  BP: 112/70 (!) 122/95 139/78 108/67  Pulse:  97 (!) 152 (!) 141  Resp: 16 18 20 16   Temp: 98.5 F (36.9 C) 98.6 F (37 C) 100.1 F (37.8 C) (!) 103 F (39.4 C)  TempSrc:  Oral Oral   SpO2: 100% 99%  100%  Weight:      Height:       General: resting in bed, diaphoretic HEENT:  EOMI, no scleral icterus, wearing cervical collar Cardiac: Tachycardic, no rubs, murmurs or gallops Pulm: clear to auscultation bilaterally, moving normal volumes of air Abd: soft, nontender, nondistended, BS present Ext: warm and well perfused, no pedal edema Neuro: alert and oriented X3, strength 0/5 bilateral lower extremities and 5/5 bilateral upper extremities  Data Reviewed:   There are no new results to review at this time.    Family Communication: Spouse at bedside Primary team communication: Discussed with PA Wendi Maya Thank you very much for involving [place] in the care of your  patient.  Author: Darreld Mclean, MD 06/12/2023 11:03 PM  For on call review [email or web address].

## 2023-06-12 NOTE — Plan of Care (Signed)
  Problem: Consults Goal: RH GENERAL PATIENT EDUCATION Description: See Patient Education module for education specifics. Outcome: Not Progressing   Problem: RH BOWEL ELIMINATION Goal: RH STG MANAGE BOWEL WITH ASSISTANCE Description: STG Manage Bowel with toileting Assistance. Outcome: Not Progressing Goal: RH STG MANAGE BOWEL W/MEDICATION W/ASSISTANCE Description: STG Manage Bowel with Medication with mod I Assistance. Outcome: Not Progressing   Problem: RH SAFETY Goal: RH STG ADHERE TO SAFETY PRECAUTIONS W/ASSISTANCE/DEVICE Description: STG Adhere to Safety Precautions With cues Assistance/Device. Outcome: Not Progressing   Problem: RH PAIN MANAGEMENT Goal: RH STG PAIN MANAGED AT OR BELOW PT'S PAIN GOAL Description: < 4 with prns Outcome: Not Progressing   Problem: RH KNOWLEDGE DEFICIT GENERAL Goal: RH STG INCREASE KNOWLEDGE OF SELF CARE AFTER HOSPITALIZATION Description: Patient and wife will be able to manage care using educational resources for diet and medication, precautions independently Outcome: Not Progressing   Problem: RH SKIN INTEGRITY Goal: RH STG SKIN FREE OF INFECTION/BREAKDOWN Outcome: Not Progressing   Problem: RH PAIN MANAGEMENT Goal: RH STG PAIN MANAGED AT OR BELOW PT'S PAIN GOAL Outcome: Not Progressing

## 2023-06-12 NOTE — Progress Notes (Addendum)
Call from nursing regarding elevated temp and heart rate. In and out cath of 300 cc urine. T 100.1, HR 52 BP 139/78. Labs, EKG, chest x-ray, UA and  culture ordered. Discussed with Dr. Allena Katz, [facility]. Added d-dimer. Repeat VS: temp 103, HR 141 BP 108/67. EKG>>sinus tach. Started NS bolus times one liter. Recommend transfer to acute for higher level of care and monitoring.  2219 hrs: chest x-ray WNL to my read. Formal report pending. LA 2.4. NS IVF running.  WBC 8.0, transaminases slightly elevated. UA, d-dimer, procalcitonin pending. TRH in to evaluate.

## 2023-06-13 ENCOUNTER — Inpatient Hospital Stay (HOSPITAL_COMMUNITY): Payer: Medicaid Other

## 2023-06-13 ENCOUNTER — Inpatient Hospital Stay (HOSPITAL_COMMUNITY)
Admission: RE | Admit: 2023-06-13 | Discharge: 2023-06-18 | DRG: 872 | Disposition: A | Discharge status: Rehabilitation Facility | Payer: Medicaid Other | Source: Ambulatory Visit | Attending: Internal Medicine | Admitting: Internal Medicine

## 2023-06-13 DIAGNOSIS — R339 Retention of urine, unspecified: Secondary | ICD-10-CM | POA: Diagnosis present

## 2023-06-13 DIAGNOSIS — R29898 Other symptoms and signs involving the musculoskeletal system: Secondary | ICD-10-CM | POA: Insufficient documentation

## 2023-06-13 DIAGNOSIS — M4712 Other spondylosis with myelopathy, cervical region: Secondary | ICD-10-CM | POA: Diagnosis present

## 2023-06-13 DIAGNOSIS — Z7401 Bed confinement status: Secondary | ICD-10-CM

## 2023-06-13 DIAGNOSIS — M4807 Spinal stenosis, lumbosacral region: Secondary | ICD-10-CM | POA: Diagnosis present

## 2023-06-13 DIAGNOSIS — M4802 Spinal stenosis, cervical region: Secondary | ICD-10-CM | POA: Diagnosis present

## 2023-06-13 DIAGNOSIS — A419 Sepsis, unspecified organism: Principal | ICD-10-CM

## 2023-06-13 DIAGNOSIS — A4159 Other Gram-negative sepsis: Principal | ICD-10-CM | POA: Diagnosis present

## 2023-06-13 DIAGNOSIS — T8383XA Hemorrhage of genitourinary prosthetic devices, implants and grafts, initial encounter: Secondary | ICD-10-CM | POA: Diagnosis present

## 2023-06-13 DIAGNOSIS — M160 Bilateral primary osteoarthritis of hip: Secondary | ICD-10-CM | POA: Diagnosis present

## 2023-06-13 DIAGNOSIS — M62838 Other muscle spasm: Secondary | ICD-10-CM | POA: Diagnosis present

## 2023-06-13 DIAGNOSIS — N319 Neuromuscular dysfunction of bladder, unspecified: Principal | ICD-10-CM

## 2023-06-13 DIAGNOSIS — R7881 Bacteremia: Secondary | ICD-10-CM | POA: Insufficient documentation

## 2023-06-13 DIAGNOSIS — N3000 Acute cystitis without hematuria: Secondary | ICD-10-CM

## 2023-06-13 DIAGNOSIS — F17210 Nicotine dependence, cigarettes, uncomplicated: Secondary | ICD-10-CM | POA: Diagnosis present

## 2023-06-13 DIAGNOSIS — M62830 Muscle spasm of back: Secondary | ICD-10-CM | POA: Diagnosis present

## 2023-06-13 DIAGNOSIS — Z981 Arthrodesis status: Secondary | ICD-10-CM

## 2023-06-13 DIAGNOSIS — Z993 Dependence on wheelchair: Secondary | ICD-10-CM

## 2023-06-13 DIAGNOSIS — F17290 Nicotine dependence, other tobacco product, uncomplicated: Secondary | ICD-10-CM | POA: Diagnosis present

## 2023-06-13 DIAGNOSIS — Y658 Other specified misadventures during surgical and medical care: Secondary | ICD-10-CM | POA: Diagnosis present

## 2023-06-13 DIAGNOSIS — G959 Disease of spinal cord, unspecified: Principal | ICD-10-CM | POA: Diagnosis present

## 2023-06-13 DIAGNOSIS — E882 Lipomatosis, not elsewhere classified: Secondary | ICD-10-CM | POA: Diagnosis present

## 2023-06-13 DIAGNOSIS — K592 Neurogenic bowel, not elsewhere classified: Secondary | ICD-10-CM | POA: Diagnosis present

## 2023-06-13 DIAGNOSIS — M48061 Spinal stenosis, lumbar region without neurogenic claudication: Secondary | ICD-10-CM | POA: Insufficient documentation

## 2023-06-13 DIAGNOSIS — K5900 Constipation, unspecified: Secondary | ICD-10-CM | POA: Diagnosis present

## 2023-06-13 HISTORY — DX: Sepsis, unspecified organism: A41.9

## 2023-06-13 LAB — URINALYSIS, ROUTINE W REFLEX MICROSCOPIC
Bilirubin Urine: NEGATIVE
Glucose, UA: NEGATIVE mg/dL
Ketones, ur: NEGATIVE mg/dL
Nitrite: NEGATIVE
Protein, ur: NEGATIVE mg/dL
Specific Gravity, Urine: 1.025 (ref 1.005–1.030)
WBC, UA: >50 WBC/hpf (ref 0–5)
pH: 6 (ref 5.0–8.0)

## 2023-06-13 LAB — RESP PANEL BY RT-PCR (RSV, FLU A&B, COVID)  RVPGX2
Influenza A by PCR: NEGATIVE
Influenza B by PCR: NEGATIVE
Resp Syncytial Virus by PCR: NEGATIVE
SARS Coronavirus 2 by RT PCR: NEGATIVE

## 2023-06-13 MED ORDER — ONDANSETRON HCL 4 MG PO TABS: 4.0000 mg | ORAL_TABLET | Freq: Four times a day (QID) | ORAL | Status: DC | PRN

## 2023-06-13 MED ORDER — ACETAMINOPHEN 500 MG PO TABS
1000.0000 mg | ORAL_TABLET | Freq: Four times a day (QID) | ORAL | Status: DC | PRN
  Administered 2023-06-13: 1000 mg via ORAL
  Filled 2023-06-13: qty 2

## 2023-06-13 MED ORDER — FLEET ENEMA RE ENEM
1.0000 | ENEMA | Freq: Once | RECTAL | Status: AC | PRN
  Administered 2023-06-16: 1 via RECTAL
  Filled 2023-06-13: qty 1

## 2023-06-13 MED ORDER — IOHEXOL 350 MG/ML SOLN
75.0000 mL | Freq: Once | INTRAVENOUS | Status: AC | PRN
  Administered 2023-06-13: 75 mL via INTRAVENOUS

## 2023-06-13 MED ORDER — HYDROCODONE-ACETAMINOPHEN 5-325 MG PO TABS: 1.0000 | ORAL_TABLET | ORAL | Status: DC | PRN

## 2023-06-13 MED ORDER — ALUM & MAG HYDROXIDE-SIMETH 200-200-20 MG/5ML PO SUSP: 30.0000 mL | ORAL | Status: DC | PRN

## 2023-06-13 MED ORDER — GUAIFENESIN-DM 100-10 MG/5ML PO SYRP: 10.0000 mL | ORAL_SOLUTION | Freq: Four times a day (QID) | ORAL | Status: DC | PRN

## 2023-06-13 MED ORDER — ENOXAPARIN SODIUM 40 MG/0.4ML IJ SOSY
40.0000 mg | PREFILLED_SYRINGE | INTRAMUSCULAR | Status: DC
  Administered 2023-06-14 – 2023-06-18 (×5): 40 mg via SUBCUTANEOUS
  Filled 2023-06-13 (×5): qty 0.4

## 2023-06-13 MED ORDER — SORBITOL 70 % SOLN: 30.0000 mL | Freq: Once | Status: DC

## 2023-06-13 MED ORDER — ORAL CARE MOUTH RINSE: 15.0000 mL | OROMUCOSAL | Status: DC | PRN

## 2023-06-13 MED ORDER — SODIUM CHLORIDE 0.9 % IV SOLN
2.0000 g | Freq: Three times a day (TID) | INTRAVENOUS | Status: DC
  Administered 2023-06-13 (×2): 2 g via INTRAVENOUS
  Filled 2023-06-13 (×3): qty 12.5

## 2023-06-13 MED ORDER — GUAIFENESIN ER 600 MG PO TB12
600.0000 mg | ORAL_TABLET | Freq: Two times a day (BID) | ORAL | Status: DC
  Administered 2023-06-14 – 2023-06-18 (×9): 600 mg via ORAL
  Filled 2023-06-13 (×9): qty 1

## 2023-06-13 MED ORDER — POLYETHYLENE GLYCOL 3350 17 G PO PACK
17.0000 g | PACK | Freq: Every day | ORAL | Status: DC | PRN
  Administered 2023-06-15: 17 g via ORAL
  Filled 2023-06-13: qty 1

## 2023-06-13 MED ORDER — MORPHINE SULFATE ER 15 MG PO TBCR
15.0000 mg | EXTENDED_RELEASE_TABLET | Freq: Two times a day (BID) | ORAL | Status: DC
  Administered 2023-06-14 – 2023-06-18 (×9): 15 mg via ORAL
  Filled 2023-06-13 (×9): qty 1

## 2023-06-13 MED ORDER — LACTATED RINGERS IV BOLUS
1000.0000 mL | Freq: Once | INTRAVENOUS | Status: AC
  Administered 2023-06-13: 1000 mL via INTRAVENOUS

## 2023-06-13 MED ORDER — ENOXAPARIN SODIUM 40 MG/0.4ML IJ SOSY
40.0000 mg | PREFILLED_SYRINGE | INTRAMUSCULAR | Status: DC
  Administered 2023-06-13: 40 mg via SUBCUTANEOUS
  Filled 2023-06-13: qty 0.4

## 2023-06-13 MED ORDER — ACETAMINOPHEN 500 MG PO TABS
1000.0000 mg | ORAL_TABLET | Freq: Four times a day (QID) | ORAL | Status: DC | PRN
  Administered 2023-06-15: 1000 mg via ORAL
  Filled 2023-06-13: qty 2

## 2023-06-13 MED ORDER — DIAZEPAM 5 MG PO TABS
5.0000 mg | ORAL_TABLET | Freq: Four times a day (QID) | ORAL | Status: DC
Start: 2023-06-14 — End: 2023-06-18
  Administered 2023-06-14 – 2023-06-18 (×16): 5 mg via ORAL
  Filled 2023-06-13 (×17): qty 1

## 2023-06-13 MED ORDER — BISACODYL 10 MG RE SUPP
10.0000 mg | Freq: Every day | RECTAL | Status: DC
  Administered 2023-06-14: 10 mg via RECTAL
  Filled 2023-06-13: qty 1

## 2023-06-13 MED ORDER — HYDROCODONE-ACETAMINOPHEN 5-325 MG PO TABS: 2.0000 | ORAL_TABLET | ORAL | Status: DC | PRN

## 2023-06-13 MED ORDER — BACLOFEN 10 MG PO TABS
20.0000 mg | ORAL_TABLET | Freq: Four times a day (QID) | ORAL | Status: DC
  Administered 2023-06-14 – 2023-06-18 (×18): 20 mg via ORAL
  Filled 2023-06-13 (×18): qty 2

## 2023-06-13 MED ORDER — SENNOSIDES-DOCUSATE SODIUM 8.6-50 MG PO TABS
2.0000 | ORAL_TABLET | Freq: Two times a day (BID) | ORAL | Status: DC
Start: 2023-06-14 — End: 2023-06-18
  Administered 2023-06-14 – 2023-06-18 (×9): 2 via ORAL
  Filled 2023-06-13 (×9): qty 2

## 2023-06-13 MED ORDER — VANCOMYCIN HCL 1.5 G IV SOLR
1500.0000 mg | Freq: Two times a day (BID) | INTRAVENOUS | Status: DC
  Administered 2023-06-13: 1500 mg via INTRAVENOUS
  Filled 2023-06-13 (×2): qty 30

## 2023-06-13 MED ORDER — ONDANSETRON HCL 4 MG/2ML IJ SOLN: 4.0000 mg | Freq: Four times a day (QID) | INTRAMUSCULAR | Status: DC | PRN

## 2023-06-13 MED ORDER — CEFEPIME HCL 2 G IV SOLR
2.0000 g | Freq: Three times a day (TID) | INTRAVENOUS | Status: DC
  Administered 2023-06-14 – 2023-06-15 (×5): 2 g via INTRAVENOUS
  Filled 2023-06-13 (×5): qty 12.5

## 2023-06-13 MED ORDER — LIDOCAINE HCL URETHRAL/MUCOSAL 2 % EX GEL
1.0000 | Freq: Four times a day (QID) | CUTANEOUS | Status: DC | PRN
  Administered 2023-06-14: 1 via URETHRAL
  Filled 2023-06-13 (×2): qty 6

## 2023-06-13 MED ORDER — TIZANIDINE HCL 4 MG PO TABS
4.0000 mg | ORAL_TABLET | Freq: Four times a day (QID) | ORAL | Status: DC | PRN
  Administered 2023-06-14 – 2023-06-17 (×4): 4 mg via ORAL
  Filled 2023-06-13 (×4): qty 1

## 2023-06-13 MED ORDER — SODIUM CHLORIDE 0.9 % IV BOLUS
1000.0000 mL | Freq: Once | INTRAVENOUS | Status: AC
  Administered 2023-06-13: 1000 mL via INTRAVENOUS

## 2023-06-13 MED ORDER — TAMSULOSIN HCL 0.4 MG PO CAPS
0.4000 mg | ORAL_CAPSULE | Freq: Every day | ORAL | Status: DC
Start: 2023-06-14 — End: 2023-06-18
  Administered 2023-06-14 – 2023-06-17 (×4): 0.4 mg via ORAL
  Filled 2023-06-13 (×4): qty 1

## 2023-06-13 MED ORDER — MENTHOL 3 MG MT LOZG: 1.0000 | LOZENGE | OROMUCOSAL | Status: DC | PRN

## 2023-06-13 MED ORDER — MORPHINE SULFATE ER 15 MG PO TBCR
15.0000 mg | EXTENDED_RELEASE_TABLET | Freq: Two times a day (BID) | ORAL | Status: DC
  Administered 2023-06-13 (×2): 15 mg via ORAL
  Filled 2023-06-13 (×3): qty 1

## 2023-06-13 MED ORDER — SORBITOL 70 % SOLN
30.0000 mL | Freq: Once | Status: DC
  Filled 2023-06-13: qty 30

## 2023-06-13 MED ORDER — IOHEXOL 300 MG/ML  SOLN: 10.0000 mL | Freq: Once | INTRAMUSCULAR | Status: DC | PRN

## 2023-06-13 NOTE — Consult Note (Signed)
Date: 06/13/2023               Patient Name:  Leon Rodriguez MRN: [id number]  DOB: [birth year] Age / Sex: 57 y.o., male   PCP: Kathleen Lime, MD         Requesting Physician: Dr. Genice Rouge, MD    Consulting Reason:  Fever, tachycardia     Chief Complaint: fever  History of Present Illness: Leon Rodriguez is a 57 yo male with cervical spinal stenosis who was admitted on 12/11 by the neurosurgery team and underwent ACDF of C6-T1. He developed myelopathy and lower extremity spasticity post-op and was admitted to CIR on 12/13. On 12/17 he had worsening of his neurologic status and MRI cervical and thoracic spine was obtained and was unchanged compared to prior. Neurosurgery was re-consulted and attempted to obtain a CT myelogram, but patient was unable to do it due to muscle spasms. [facility] was consulted yesterday due to fever and tachycardia - fever was as high as 103F, but today, IMTS to take over as this is an [facility] patient.   The patient states that aside from having worsening of his leg weakness yesterday, he felt fine. He did not know he was febrile, but did have chills. He denies any cough, congestion, chest pain, SOB, abd pain, n/v/d, dysuria, or hematuria.   Labs notable for lactic acid level elevated to 2.4, no leukocytosis, Hb stable at 11.3, normal kidney function, and newly elevated AST/ALT to 58/49. Procal elevated to 0.91. CTA was negative for PE but did show ground-glass opacities in the right lower lobe, likely infectious vs inflammatory.  Meds: Current Facility-Administered Medications  Medication Dose Route Frequency Provider Last Rate Last Admin   acetaminophen (TYLENOL) tablet 325-650 mg  325-650 mg Oral Q4H PRN Charlsie Quest, MD   650 mg at 06/13/23 0256   alum & mag hydroxide-simeth (MAALOX/MYLANTA) 200-200-20 MG/5ML suspension 30 mL  30 mL Oral Q4H PRN Setzer, Lynnell Jude, PA-C       baclofen (LIORESAL) tablet 20 mg  20 mg Oral Q6H Lovorn, Megan, MD    20 mg at 06/12/23 2022   bisacodyl (DULCOLAX) suppository 10 mg  10 mg Rectal Once Lovorn, Aundra Millet, MD       bisacodyl (DULCOLAX) suppository 10 mg  10 mg Rectal q1800 Lovorn, Aundra Millet, MD   10 mg at 06/12/23 1718   diazepam (VALIUM) tablet 5 mg  5 mg Oral Q6H Lovorn, Megan, MD   5 mg at 06/12/23 1427   guaiFENesin (MUCINEX) 12 hr tablet 600 mg  600 mg Oral BID Lovorn, Aundra Millet, MD   600 mg at 06/12/23 2022   guaiFENesin-dextromethorphan (ROBITUSSIN DM) 100-10 MG/5ML syrup 10 mL  10 mL Oral Q6H PRN Milinda Antis, PA-C       HYDROcodone-acetaminophen (NORCO/VICODIN) 5-325 MG per tablet 1 tablet  1 tablet Oral Q4H PRN Clovis Riley, PA-C   1 tablet at 06/12/23 0531   HYDROcodone-acetaminophen (NORCO/VICODIN) 5-325 MG per tablet 2 tablet  2 tablet Oral Q4H PRN Clovis Riley, PA-C   2 tablet at 06/10/23 1030   iohexol (OMNIPAQUE) 300 MG/ML solution 10 mL  10 mL Intrathecal Once PRN Dawley, Troy C, DO       lidocaine (XYLOCAINE) 2 % jelly 1 Application  1 Application Urethral Q6H PRN Genice Rouge, MD   1 Application at 06/13/23 0231   menthol-cetylpyridinium (CEPACOL) lozenge 3 mg  1 lozenge Oral PRN Genice Rouge, MD  ondansetron (ZOFRAN) tablet 4 mg  4 mg Oral Q6H PRN Milinda Antis, PA-C       Or   ondansetron [facility]) injection 4 mg  4 mg Intravenous Q6H PRN Setzer, Lynnell Jude, PA-C       Oral care mouth rinse  15 mL Mouth Rinse PRN Lovorn, Megan, MD       polyethylene glycol (MIRALAX / GLYCOLAX) packet 17 g  17 g Oral Daily PRN [name] Caylin, PA-C       senna-docusate (Senokot-S) tablet 2 tablet  2 tablet Oral BID Ranelle Oyster, MD   2 tablet at 06/12/23 2022   sodium phosphate (FLEET) enema 1 enema  1 enema Rectal Once PRN [name] Caylin, PA-C       tamsulosin [facility]) capsule 0.4 mg  0.4 mg Oral QPC supper Faith Rogue T, MD   0.4 mg at 06/12/23 1718   tiZANidine (ZANAFLEX) tablet 4 mg  4 mg Oral Q6H PRN Genice Rouge, MD   4 mg at 06/12/23 1718     Allergies: Allergies as of 06/07/2023   (No Known Allergies)   Past Medical History:  Diagnosis Date   Arthritis    Hip pain    Past Surgical History:  Procedure Laterality Date   ANTERIOR CERVICAL DECOMP/DISCECTOMY FUSION N/A 06/05/2023   Procedure: ANTERIOR CERVICAL DISCECTOMY AND FUSION CERVICAL SIX-SEVEN/CERVICAL SEVEN-THORACIC ONE;  Surgeon: Bethann Goo, DO;  Location: [facility] OR;  Service: Neurosurgery;  Laterality: N/A;  3C   APPENDECTOMY     CIRCUMCISION     HIATAL HERNIA REPAIR     HIP SURGERY     at 69 and 57 yo   History reviewed. No pertinent family history. Social History   Socioeconomic History   Marital status: Married    Spouse name: Not on file   Number of children: Not on file   Years of education: Not on file   Highest education level: Not on file  Occupational History   Not on file  Tobacco Use   Smoking status: Every Day    Types: Cigarettes, Cigars   Smokeless tobacco: Never  Vaping Use   Vaping status: Never Used  Substance and Sexual Activity   Alcohol use: Not Currently   Drug use: Yes    Types: Marijuana   Sexual activity: Not on file  Other Topics Concern   Not on file  Social History Narrative   Not on file   Social Drivers of Health   Financial Resource Strain: Not on file  Food Insecurity: Patient Declined (06/05/2023)   Hunger Vital Sign    Worried About Running Out of Food in the Last Year: Patient declined    Ran Out of Food in the Last Year: Patient declined  Transportation Needs: Patient Declined (06/05/2023)   PRAPARE - Administrator, Civil Service (Medical): Patient declined    Lack of Transportation (Non-Medical): Patient declined  Physical Activity: Not on file  Stress: Not on file  Social Connections: Not on file  Intimate Partner Violence: Not on file    Review of Systems: Pertinent items are noted in HPI.  Physical Exam: Blood pressure 130/80, pulse (!) 119, temperature 99.6 F (37.6 C),  resp. rate 18, height 5\' 9"  (1.753 m), weight 93 kg, SpO2 100%.  General: Pleasant, well-appearing middle aged male laying in bed with cervical collar in place. No acute distress. HEENT: [place]/AT. Anicteric sclera CV: Tachycardic rate, regular rhythm.  Pulmonary: Lungs CTAB. Normal effort on  room air Abdominal: Soft, nontender, nondistended. Skin: Warm and dry.  Neuro: A&Ox3. CN II-XII intact. 5/5 strength bilateral upper extremities, 0/5 strength bilateral lower extremities. Psych: Normal mood and affect   Lab results: @LABTEST @  Imaging results:  CT Angio Chest Pulmonary Embolism (PE) W or WO Contrast Result Date: 06/13/2023 CLINICAL DATA:  Positive D-dimer EXAM: CT ANGIOGRAPHY CHEST WITH CONTRAST TECHNIQUE: Multidetector CT imaging of the chest was performed using the standard protocol during bolus administration of intravenous contrast. Multiplanar CT image reconstructions and MIPs were obtained to evaluate the vascular anatomy. RADIATION DOSE REDUCTION: This exam was performed according to the departmental dose-optimization program which includes automated exposure control, adjustment of the mA and/or kV according to patient size and/or use of iterative reconstruction technique. CONTRAST:  75mL OMNIPAQUE IOHEXOL 350 MG/ML SOLN COMPARISON:  Cervical spine CT 06/11/2023. FINDINGS: Cardiovascular: Satisfactory opacification of the pulmonary arteries to the segmental level. No evidence of pulmonary embolism. Normal heart size. No pericardial effusion. Mediastinum/Nodes: No enlarged mediastinal, hilar, or axillary lymph nodes. Thyroid gland, trachea, and esophagus demonstrate no significant findings. Lungs/Pleura: There are ill-defined ground-glass nodular densities in the inferior right lower lobe. There are patchy ground-glass opacities in both lower lobes. There is no pleural effusion or pneumothorax. Upper Abdomen: No acute abnormality. Musculoskeletal: Anterior fusion hardware is seen at C6 and  C7. There is prevertebral swelling and edema at this level measuring up to 2 cm. There is also subcutaneous edema in the anterior mid neck with overlying skin staples. No discrete fluid collection or soft tissue gas identified. Review of the MIP images confirms the above findings. IMPRESSION: 1. No evidence for pulmonary embolism. 2. Ill-defined ground-glass nodular densities in the inferior right lower lobe and patchy ground-glass opacities in both lower lobes, likely infectious/inflammatory. 3. Anterior fusion hardware at C6 and C7 with prevertebral swelling and edema measuring up to 2 cm. Findings may be postsurgical in nature. 4. Subcutaneous edema in the anterior mid neck with overlying skin staples. No discrete fluid collection or soft tissue gas identified. Electronically Signed   By: Darliss Cheney M.D.   On: 06/13/2023 02:09   DG Chest Port 1 View Result Date: 06/12/2023 CLINICAL DATA:  Fever. EXAM: PORTABLE CHEST 1 VIEW COMPARISON:  01/06/2023. FINDINGS: The heart size and mediastinal contours are within normal limits. No consolidation, effusion, or pneumothorax. No acute osseous abnormality. Cervical spinal fusion hardware is noted. Surgical staples are noted over the upper mediastinum. IMPRESSION: No active disease. Electronically Signed   By: Thornell Sartorius M.D.   On: 06/12/2023 22:47   DG FL GUIDED LUMBAR PUNCTURE Result Date: 06/12/2023 CLINICAL DATA:  57 year old male with recent C6-C7 and C7-T1 discectomy and fusion presents with worsening leg spasm. Request for fluoro guided C-spine myelogram. EXAM: LUMBAR PUNCTURE UNDER FLUOROSCOPIC GUIDANCE COMPARISON:  None Available. FLUOROSCOPY: Radiation Exposure Index (as provided by the fluoroscopic device): 49.6 mGy Kerma PROCEDURE: Informed consent was obtained from the patient prior to the procedure, including potential risks and complications of headache, allergy, bleeding, damage to adjacent structures, infection, spinal hematoma, and persistent  CSF leak which may require additional procedure. Time-out was performed to verify correct patient and procedure. Allergies were reviewed. With the patient prone, the lower back was prepped with Betadine. 1% Lidocaine was used for local anesthesia. Lumbar puncture was attempted at the L2- L3 level using a 5 inch 22 gauge needle, no CSF return. Additional lumbar puncture was attempted at the level of L1-L2, unsuccessful. Dr. Lowella Dandy was called to the procedure room  for assistance, however, patient was experiencing dyspneic and became diaphoretic, patient asked to stop the procedure. The procedure was terminated. MR L-spine from 04/28/2023 reviewed by Dr. Lowella Dandy, it appears that patient does not have much CSF in the lumbar spine. IMPRESSION: Unsuccessful lumbar puncture.  Myelogram was not performed. This procedure was performed by Lawernce Ion, PA-C under the supervision of Richarda Overlie, MD. Electronically Signed   By: Richarda Overlie M.D.   On: 06/12/2023 16:08   CT CERVICAL SPINE W CONTRAST Result Date: 06/12/2023 CLINICAL DATA:  Cervical spinal stenosis EXAM: CT CERVICAL SPINE WITH CONTRAST TECHNIQUE: Multidetector CT imaging of the cervical spine was performed during intravenous contrast administration. Multiplanar CT image reconstructions were also generated. RADIATION DOSE REDUCTION: This exam was performed according to the departmental dose-optimization program which includes automated exposure control, adjustment of the mA and/or kV according to patient size and/or use of iterative reconstruction technique. CONTRAST:  75mL OMNIPAQUE IOHEXOL 350 MG/ML SOLN COMPARISON:  None Available. FINDINGS: Alignment: Normal. Skull base and vertebrae: Multilevel vertebral body height loss throughout the cervical spine. Flowing anterior osteophytes along the length of the cervical spine. C6-T1 ACDF. Soft tissues and spinal canal: Postsurgical changes of left anterior approach for cervical spine fusion. Low-attenuation collection ventral  to the spinal column at the C6-T2 levels. Disc levels:  No high-grade bony spinal canal stenosis. Upper chest: Negative Other: None. IMPRESSION: 1. Postsurgical changes of left anterior approach for cervical spine fusion. Low-attenuation collection ventral to the spinal column at the C6-T2 levels is nonspecific, without specific features of infection. 2. No high-grade bony spinal canal stenosis. Electronically Signed   By: Deatra Robinson M.D.   On: 06/12/2023 02:26   MR CERVICAL SPINE WO CONTRAST Result Date: 06/11/2023 CLINICAL DATA:  Spinal cord injury, follow up; Spinal cord injury, follow up New SCI with increasing spasticity, new neurogenic bowel and bladder since surgery EXAM: MRI CERVICAL AND THORACIC SPINE WITHOUT CONTRAST TECHNIQUE: Multiplanar and multiecho pulse sequences of the cervical spine, to include the craniocervical junction and cervicothoracic junction, and the thoracic spine, were obtained without intravenous contrast. COMPARISON:  MR C SPine 05/10/23 FINDINGS: Limitations: Limited exam due to the degree of motion artifact. MRI CERVICAL SPINE FINDINGS Alignment: Physiologic. Vertebrae: Status post C5-C7 ACDF. The degree of motion artifact markedly limits the ability to assess for the presence of acute osseous abnormality or hardware failure. There is prevertebral soft tissue edema of the C7-T3 levels, favored to be postsurgical. Cord: There is persistent T2 hyperintense signal abnormality within the spinal cord at the C7-T1 level. Posterior Fossa, vertebral arteries, paraspinal tissues: Prevertebral soft tissue edema of the C6-T3 levels is favored to be postsurgical. There is likely a small hematoma in the left lateral (series [id number]. Disc levels: The degree of motion artifact precludes detailed characterization of the degree of spinal canal stenosis. There is persistent severe spinal canal stenosis of the C6-T1 levels. MRI THORACIC SPINE FINDINGS Alignment:  Physiologic. Vertebrae: No fracture,  evidence of discitis, or bone lesion. Cord:  Normal signal and morphology. Paraspinal and other soft tissues: Small amount of layering fluid in the esophagus, which places the patient at risk for aspiration. There is a posterior mediastinal hematoma which likely causes mass effect on the posterior esophagus. Correlate for symptoms of dysphagia. Disc levels: There are multilevel small degenerative disc bulges in the thoracic spine, most notably at T6-T7, T7-T8, T11-T12, T12-L1. There is likely moderate spinal canal narrowing at T6-T7. Likely severe right and moderate left neural foraminal narrowing at T11-T12  IMPRESSION: 1. Limited exam due to the degree of motion artifact. Within this limitation, there is persistent severe spinal canal stenosis of the C6-T1 levels with associated T2 hyperintense cord signal abnormality at the C7-T1 level, unchanged from prior. Repeat exam is recommended if there is concern for progressive stenosis or compressive myelopathy. 2. Posterior mediastinal hematoma which likely causes mass effect on the posterior esophagus. Correlate for symptoms of dysphagia. 3. Prevertebral soft tissue edema of the C6-T3 levels is favored to be postsurgical. There is likely a small hematoma in the left lateral neck. 4. No acute abnormality in the thoracic spine. Thoracic spine degenerative changes, as above. Electronically Signed   By: Lorenza Cambridge M.D.   On: 06/11/2023 12:04   MR THORACIC SPINE WO CONTRAST Result Date: 06/11/2023 CLINICAL DATA:  Spinal cord injury, follow up; Spinal cord injury, follow up New SCI with increasing spasticity, new neurogenic bowel and bladder since surgery EXAM: MRI CERVICAL AND THORACIC SPINE WITHOUT CONTRAST TECHNIQUE: Multiplanar and multiecho pulse sequences of the cervical spine, to include the craniocervical junction and cervicothoracic junction, and the thoracic spine, were obtained without intravenous contrast. COMPARISON:  MR C SPine 05/10/23 FINDINGS:  Limitations: Limited exam due to the degree of motion artifact. MRI CERVICAL SPINE FINDINGS Alignment: Physiologic. Vertebrae: Status post C5-C7 ACDF. The degree of motion artifact markedly limits the ability to assess for the presence of acute osseous abnormality or hardware failure. There is prevertebral soft tissue edema of the C7-T3 levels, favored to be postsurgical. Cord: There is persistent T2 hyperintense signal abnormality within the spinal cord at the C7-T1 level. Posterior Fossa, vertebral arteries, paraspinal tissues: Prevertebral soft tissue edema of the C6-T3 levels is favored to be postsurgical. There is likely a small hematoma in the left lateral (series [id number]. Disc levels: The degree of motion artifact precludes detailed characterization of the degree of spinal canal stenosis. There is persistent severe spinal canal stenosis of the C6-T1 levels. MRI THORACIC SPINE FINDINGS Alignment:  Physiologic. Vertebrae: No fracture, evidence of discitis, or bone lesion. Cord:  Normal signal and morphology. Paraspinal and other soft tissues: Small amount of layering fluid in the esophagus, which places the patient at risk for aspiration. There is a posterior mediastinal hematoma which likely causes mass effect on the posterior esophagus. Correlate for symptoms of dysphagia. Disc levels: There are multilevel small degenerative disc bulges in the thoracic spine, most notably at T6-T7, T7-T8, T11-T12, T12-L1. There is likely moderate spinal canal narrowing at T6-T7. Likely severe right and moderate left neural foraminal narrowing at T11-T12 IMPRESSION: 1. Limited exam due to the degree of motion artifact. Within this limitation, there is persistent severe spinal canal stenosis of the C6-T1 levels with associated T2 hyperintense cord signal abnormality at the C7-T1 level, unchanged from prior. Repeat exam is recommended if there is concern for progressive stenosis or compressive myelopathy. 2. Posterior mediastinal  hematoma which likely causes mass effect on the posterior esophagus. Correlate for symptoms of dysphagia. 3. Prevertebral soft tissue edema of the C6-T3 levels is favored to be postsurgical. There is likely a small hematoma in the left lateral neck. 4. No acute abnormality in the thoracic spine. Thoracic spine degenerative changes, as above. Electronically Signed   By: Lorenza Cambridge M.D.   On: 06/11/2023 12:04    Other results: EKG: normal EKG, normal sinus rhythm, unchanged from previous tracings, sinus tachycardia.  Assessment, Plan, & Recommendations by Problem: Principal Problem:   Cervical myelopathy (HCC)  Sepsis rule out Blood pressure stable, but  with new fever and tachycardia in the setting of a recent ACDF of C6-T1, there was concern for PE. CTA PE was negative, but did show ground glass opacities in the RLL, concerning for infectious/inflammatory etiology. Procal elevated to 0.91 but patient does not have a cough, congestion, or SOB. No leukocytosis on lab work, but lactic was elevated to 2.4, improved to 2 after IVF. While we do not have a clear source of infection, patient does have newly elevated AST/ALT to 58/49 concerning for sepsis. Resp panel negative, blood and urine cultures in process. Infection is highest on my differential in the setting of recent spine surgery, although dysautonomia is also on the differential - Follow up UA, urine culture - Follow up blood cultures - Start empiric vanc, cefepime while ruling out infection - Tylenol PRN for fever - Obtain CT myelogram  Cervical myelopathy s/p ACDF 06/05/23 Patient in CIR to improve mobility after his recent spinal surgery. Currently has 5/5 strength upper extremities and 0/5 strength in the lower extremities.  - Pain meds per primary    Signed: Chauncey Mann, DO 06/13/2023, 7:34 AM

## 2023-06-13 NOTE — H&P (Incomplete)
Date: 06/13/2023               Patient Name:  Leon Rodriguez MRN: [id number]  DOB: 1966-04-13 Age / Sex: 57 y.o., male   PCP: Kathleen Lime, MD         Medical Service: Internal Medicine Teaching Service         Attending Physician: Dr. Reymundo Poll, MD      First Contact: Dr. Faith Rogue, DO Pager [phone]    Second Contact: Dr. Marrianne Mood, MD Pager [phone]         After Hours (After 5p/  First Contact Pager: [phone]  weekends / holidays): Second Contact Pager: [phone]   SUBJECTIVE   Chief Complaint: Fever  History of Present Illness: Leon Rodriguez is a 57 yo M with cervical spinal stenosis admitted on 12/11 by neurosurgery team for ACDF of C6-T1 c/b myelopathy with new neurogenic bladder and LE spasticity transitioned to CIR service on 12/13, for whom IMTS was consulted in setting of sepsis concern on 12/18, now being transferred to general medicine floor for continued sepsis management   Medicine service was consulted for tachycardia and fever currently being treated for sepsis with IV antibiotics started at 1PM. Night covering resident received page about patient rigors, fever, and worsening tachycardia. Rapid response team paged to bedside; patient with HR to 141 and BP 135/115, SPO2 >94% on RA and without distress. Patient given one dose of 1000 mg tylenol.    At bedside, patient was sitting up in bed in NAD, conversant, watching TV. Denies chest pain, shortness of breath, or abdominal discomfort. Patient denied generalized pain. No orthopnea or PND.   Patient denies suprapubic on flank pain. Patient has a history of urinary incontinence first noted on 11/3, prior to neurosurgery. He has been wearing adults briefs at home. No penile discharge, ulcers.   Has been in/out catheterized multiple times during this admission. Two days ago there was a traumatic episode with blood during catheterization. No blood since in his adult briefs.   Of note, since his  admission to CIR, patient has worsening of neurological status wiith bilateral lower extremity weakness for which neurosurgery was consulted. Unsucessful CT  cervical myelogram due to patient spasms. He reports that has regained some function in his RLE compared to two days ago but he remains unable to move L leg against gravity. His prior functional status was full mobility without assistance    Meds:  No outpatient medications have been marked as taking for the 06/13/23 encounter [facility] Encounter).    Past Medical History  Past Surgical History:  Procedure Laterality Date  . ANTERIOR CERVICAL DECOMP/DISCECTOMY FUSION N/A 06/05/2023   Procedure: ANTERIOR CERVICAL DISCECTOMY AND FUSION CERVICAL SIX-SEVEN/CERVICAL SEVEN-THORACIC ONE;  Surgeon: Dawley, Alan Mulder, DO;  Location: [facility] OR;  Service: Neurosurgery;  Laterality: N/A;  3C  . APPENDECTOMY    . CIRCUMCISION    . HIATAL HERNIA REPAIR    . HIP SURGERY     at 53 and 79 yo    Social:  Lives With: girlfriend Dolores Support: family Level of Function: Independent with iADLs and ADLs prior to this admission PCP: Dr. Mickie Bail vs [facility] Sprang - will need to clarify this  Substances: 2 cigars per day. 1-2 beers most days. THC products most days. No IV or other inhaled substaces  Family History: NA  Allergies: Allergies as of 06/13/2023  . (No Known Allergies)    Review of Systems: A complete ROS was negative except as  per HPI.   OBJECTIVE:   Physical Exam: Blood pressure 96/67, pulse (!) 105, temperature 98.6 F (37 C), resp. rate 20, SpO2 100%.  Constitutional: well-appearing man sitting in laying, in no acute distress HENT: normocephalic atraumatic, mucous membranes moist. Anterior neck bandage without drainage, tenderness or fluctuance Cardiovascular: Tachycardia, no m/r/g, no JVD Pulmonary/Chest: normal work of breathing on room air, lungs clear to auscultation bilaterally anteriorly. Exam limited by cervical collar. No crackles or  wheezes Abdominal: soft, non-tender, non-distended. NO No fluid wave no asterixis Neurological: alert & oriented x 3, conversing appropriately. Face symmetric, BUE 5/5 in strength, RLE 5/3, LLE 0-1/5 MSK: no gross abnormalities. Trace bilateral edema, non pitting edema Skin: warm and dry Psych: Normal mood and affect  Labs: CBC    Component Value Date/Time   WBC 8.0 06/12/2023 2128   RBC 3.31 (L) 06/12/2023 2128   HGB 11.3 (L) 06/12/2023 2128   HCT 31.7 (L) 06/12/2023 2128   PLT 292 06/12/2023 2128   MCV 95.8 06/12/2023 2128   MCH 34.1 (H) 06/12/2023 2128   MCHC 35.6 06/12/2023 2128   RDW 12.4 06/12/2023 2128   LYMPHSABS 0.4 (L) 06/12/2023 2128   MONOABS 0.3 06/12/2023 2128   EOSABS 0.0 06/12/2023 2128   BASOSABS 0.0 06/12/2023 2128     CMP     Component Value Date/Time   NA 137 06/12/2023 2128   K 3.8 06/12/2023 2128   CL 102 06/12/2023 2128   CO2 26 06/12/2023 2128   GLUCOSE 167 (H) 06/12/2023 2128   BUN 10 06/12/2023 2128   CREATININE 0.80 06/12/2023 2128   CALCIUM 9.5 06/12/2023 2128   PROT 6.0 (L) 06/12/2023 2128   ALBUMIN 3.0 (L) 06/12/2023 2128   AST 58 (H) 06/12/2023 2128   ALT 49 (H) 06/12/2023 2128   ALKPHOS 106 06/12/2023 2128   BILITOT 0.9 06/12/2023 2128   GFRNONAA >60 06/12/2023 2128   GFRAA  11/09/2008 1258    >60        The eGFR has been calculated using the MDRD equation. This calculation has not been validated in all clinical situations. eGFR's persistently <60 mL/min signify possible Chronic Kidney Disease.    Imaging: CT Angio Chest Pulmonary Embolism (PE) W or WO Contrast Result Date: 06/13/2023 CLINICAL DATA:  Positive D-dimer EXAM: CT ANGIOGRAPHY CHEST WITH CONTRAST TECHNIQUE: Multidetector CT imaging of the chest was performed using the standard protocol during bolus administration of intravenous contrast. Multiplanar CT image reconstructions and MIPs were obtained to evaluate the vascular anatomy. RADIATION DOSE REDUCTION: This exam  was performed according to the departmental dose-optimization program which includes automated exposure control, adjustment of the mA and/or kV according to patient size and/or use of iterative reconstruction technique. CONTRAST:  75mL OMNIPAQUE IOHEXOL 350 MG/ML SOLN COMPARISON:  Cervical spine CT 06/11/2023. FINDINGS: Cardiovascular: Satisfactory opacification of the pulmonary arteries to the segmental level. No evidence of pulmonary embolism. Normal heart size. No pericardial effusion. Mediastinum/Nodes: No enlarged mediastinal, hilar, or axillary lymph nodes. Thyroid gland, trachea, and esophagus demonstrate no significant findings. Lungs/Pleura: There are ill-defined ground-glass nodular densities in the inferior right lower lobe. There are patchy ground-glass opacities in both lower lobes. There is no pleural effusion or pneumothorax. Upper Abdomen: No acute abnormality. Musculoskeletal: Anterior fusion hardware is seen at C6 and C7. There is prevertebral swelling and edema at this level measuring up to 2 cm. There is also subcutaneous edema in the anterior mid neck with overlying skin staples. No discrete fluid collection or soft tissue  gas identified. Review of the MIP images confirms the above findings. IMPRESSION: 1. No evidence for pulmonary embolism. 2. Ill-defined ground-glass nodular densities in the inferior right lower lobe and patchy ground-glass opacities in both lower lobes, likely infectious/inflammatory. 3. Anterior fusion hardware at C6 and C7 with prevertebral swelling and edema measuring up to 2 cm. Findings may be postsurgical in nature. 4. Subcutaneous edema in the anterior mid neck with overlying skin staples. No discrete fluid collection or soft tissue gas identified. Electronically Signed   By: Darliss Cheney M.D.   On: 06/13/2023 02:09      EKG: personally reviewed my interpretation is***. Prior EKG***  ASSESSMENT & PLAN:   Assessment & Plan by Problem: Active Problems:   Sepsis  (HCC)   Sepsis rule out Not clear source yet. CT PE negative for emblism but with bilateral lower lobe ground glass opacities concerning for infection however, patient without hypoxic or respiratory symptoms. U/A with pyuria and bacteriuria. Blood and urine cultures sill pending. Started on IV Cefepime and vancomycin at 1PM on 12/19; will hold off on changing antibiotic at this time. Suspect urinary source of infection given new neurogenic bladder and hx of retention s/p recent hx of catheter trauma with potential bacterial translocation, Continued to be tachycardic and fever controlled with Tylenol PRN.  - Continue on IV Cefepime and vancomycin - Follow up blood and urine cultures - S/p 1L IV LR; on cardiac telemetry, monitor VS - Tylenol 1000 mg q8H for fever  Neurogenic bladder L5-S1 canal stenosis and BL foraminal stenosis Seen on MRI lumbar spine on 04/28/2023 for evaluation of cauda equina, urinary incontinence, and LE spasms with history of incontinence since. -Continue Tamsulosin 0.4 ng daily; hold if hypotensive -Bladder scans q 6 hours; would benefir    Cervical myelopathy  New onset neurogenic bladder Functional deconditioning s/p ACDF C6/T1 on 06/05/23 after presenting with cord compression c/b by worsening of LE strength during CIR admission. CT cervical spine with contrast without changes on 12/17. Attempt at repeat Ct myelogram unsuccessful due to ongoing spasms. RLE weakness slowly improving. - Appreciate neurosurgery recommendations - On Noco/vicodin 5-325 mg q4 PRN - MS contin 15 mg 12HR BID  Diet: Heart Healthy VTE: Enoxaparin Code: Full   Barriers to Discharge: Clinical improvement  Dispo: Admit patient to {STATUS:3044014::"Observation with expected length of stay less than 2 midnights.","Inpatient with expected length of stay greater than 2 midnights."}  Signed:   Morene Crocker, MD Internal Medicine Resident PGY-2 06/13/2023, 10:56 PM   Please  contact the on call pager after 5 pm and on weekends at [phone].

## 2023-06-13 NOTE — Progress Notes (Signed)
Physical Therapy Note  Patient Details  Name: Leon Rodriguez MRN: [id number] Date of Birth: 1965/10/24 Today's Date: 06/13/2023    Pt currently on medical hold. Pt missed 75 min skilled PT due to medical hold as pt currently tachy and febrile. Will continue efforts when medically appropriate.   Leon Rodriguez 06/13/2023, 2:32 PM

## 2023-06-13 NOTE — Progress Notes (Signed)
Patient's vital signs retaken with HR and temperature elevated to 103.0 - putting the patient in red [place]. Patient is seen drowsy and visibly shaky in all extremities.   PA updated on temperature and status change. Patient soon seen by hospitalist and ordered a bolus of fluids.   Urine culture (from in and out due to incontinence) and PCR collected and sent to lab during shift.

## 2023-06-13 NOTE — Progress Notes (Signed)
Patient has excellent peripheral vasculature; please attempt prior to placing IVT consult. Leon Palau Loyola Mast, RN

## 2023-06-13 NOTE — Progress Notes (Signed)
Physical Therapy Session Note  Patient Details  Name: Leon Rodriguez MRN: [id number] Date of Birth: 1965/10/18  Today's Date: 06/13/2023 PT Individual Time: 6213-0865 PT Individual Time Calculation (min): 15 min  and Today's Date: 06/13/2023 PT Missed Time: 60 Minutes Missed Time Reason: Patient fatigue;Other (Comment) (pt drowsy and difficult to rouse)  Short Term Goals: Week 1:  PT Short Term Goal 1 (Week 1): Pt will perform supine<>sit with max A of 1 consistently PT Short Term Goal 2 (Week 1): Pt will perform sit<>stands using LRAD with mod A of PT Short Term Goal 3 (Week 1): Pt will perform bed<>chair transfers using LRAD with mod A of 1 and +2 for safety as needed PT Short Term Goal 4 (Week 1): Pt will ambulate at least 44ft using LRAD with mod A of 1 and +2 as needed  Skilled Therapeutic Interventions/Progress Updates:    pt received in bed with his wife present. Therapist provided new pads for aspen collar and educated on hand washing and removal after showers. Pt's wife with questions about collar wear schedule, therapist provided clarification on orders to wear OOB, but can leave it on in bed for comfort if desired. Pt was unable to rouse enough for meaningful therapy at this time and medical testing pending later in the day. Pt missed 60 min at this time, will attempt to make up as able.  Therapy Documentation Precautions:  Precautions Precautions: Fall, Cervical, Other (comment) Precaution Comments: hx of many falls, severe B LE hypertonia with muscle spasms Required Braces or Orthoses: Cervical Brace Cervical Brace: Hard collar (to be worn out of bed) Restrictions Weight Bearing Restrictions Per Provider Order: No General: PT Amount of Missed Time (min): 60 Minutes PT Missed Treatment Reason: Patient fatigue;Other (Comment) (pt drowsy and difficult to rouse)     Therapy/Group: Individual Therapy  Juluis Rainier 06/13/2023, 11:12 AM

## 2023-06-13 NOTE — Progress Notes (Signed)
Refused sorbitol had 2 medium and 1 large BM.

## 2023-06-13 NOTE — Progress Notes (Signed)
Call from nursing staff regarding rigors, temp to 101.9 pulse 135 BP 176/79. Informed IMTS and discussed increasing Tylenol dosing. Requested new VS: temp now 102.9, pulse 141 and BP 135/115. Called rapid response and informed IMTS.

## 2023-06-13 NOTE — Progress Notes (Signed)
PROGRESS NOTE   Subjective/Complaints:  Pt had some stool with bowel program but still oozing per fiance'.   Is very fashion oriented and likes to dress up- fiance; said upset due to incontinence.   Willing to try Sorbitol to get cleaned out- pt thinks ok, but doesn't need 60cc.   Pt reports neck and back killing him- pain meds not working for him esp when coughs- REALLY painful.  Had 2 suppositories last night, of note.  Spasms also worse- likely due to infection.   Spoke to IM resident- will see if can keep him on CIR, but if not, educated pt/fiance' might need to go back to acute hospital.     ROS:   Pt denies  (+) mild  SOB, abd pain, CP, N/V/C/D, and vision changes  Feels achy all over Negative except HPI Objective:   CT Angio Chest Pulmonary Embolism (PE) W or WO Contrast Result Date: 06/13/2023 CLINICAL DATA:  Positive D-dimer EXAM: CT ANGIOGRAPHY CHEST WITH CONTRAST TECHNIQUE: Multidetector CT imaging of the chest was performed using the standard protocol during bolus administration of intravenous contrast. Multiplanar CT image reconstructions and MIPs were obtained to evaluate the vascular anatomy. RADIATION DOSE REDUCTION: This exam was performed according to the departmental dose-optimization program which includes automated exposure control, adjustment of the mA and/or kV according to patient size and/or use of iterative reconstruction technique. CONTRAST:  75mL OMNIPAQUE IOHEXOL 350 MG/ML SOLN COMPARISON:  Cervical spine CT 06/11/2023. FINDINGS: Cardiovascular: Satisfactory opacification of the pulmonary arteries to the segmental level. No evidence of pulmonary embolism. Normal heart size. No pericardial effusion. Mediastinum/Nodes: No enlarged mediastinal, hilar, or axillary lymph nodes. Thyroid gland, trachea, and esophagus demonstrate no significant findings. Lungs/Pleura: There are ill-defined ground-glass nodular  densities in the inferior right lower lobe. There are patchy ground-glass opacities in both lower lobes. There is no pleural effusion or pneumothorax. Upper Abdomen: No acute abnormality. Musculoskeletal: Anterior fusion hardware is seen at C6 and C7. There is prevertebral swelling and edema at this level measuring up to 2 cm. There is also subcutaneous edema in the anterior mid neck with overlying skin staples. No discrete fluid collection or soft tissue gas identified. Review of the MIP images confirms the above findings. IMPRESSION: 1. No evidence for pulmonary embolism. 2. Ill-defined ground-glass nodular densities in the inferior right lower lobe and patchy ground-glass opacities in both lower lobes, likely infectious/inflammatory. 3. Anterior fusion hardware at C6 and C7 with prevertebral swelling and edema measuring up to 2 cm. Findings may be postsurgical in nature. 4. Subcutaneous edema in the anterior mid neck with overlying skin staples. No discrete fluid collection or soft tissue gas identified. Electronically Signed   By: Darliss Cheney M.D.   On: 06/13/2023 02:09   DG Chest Port 1 View Result Date: 06/12/2023 CLINICAL DATA:  Fever. EXAM: PORTABLE CHEST 1 VIEW COMPARISON:  01/06/2023. FINDINGS: The heart size and mediastinal contours are within normal limits. No consolidation, effusion, or pneumothorax. No acute osseous abnormality. Cervical spinal fusion hardware is noted. Surgical staples are noted over the upper mediastinum. IMPRESSION: No active disease. Electronically Signed   By: Thornell Sartorius M.D.   On: 06/12/2023 22:47  DG FL GUIDED LUMBAR PUNCTURE Result Date: 06/12/2023 CLINICAL DATA:  57 year old male with recent C6-C7 and C7-T1 discectomy and fusion presents with worsening leg spasm. Request for fluoro guided C-spine myelogram. EXAM: LUMBAR PUNCTURE UNDER FLUOROSCOPIC GUIDANCE COMPARISON:  None Available. FLUOROSCOPY: Radiation Exposure Index (as provided by the fluoroscopic device):  49.6 mGy Kerma PROCEDURE: Informed consent was obtained from the patient prior to the procedure, including potential risks and complications of headache, allergy, bleeding, damage to adjacent structures, infection, spinal hematoma, and persistent CSF leak which may require additional procedure. Time-out was performed to verify correct patient and procedure. Allergies were reviewed. With the patient prone, the lower back was prepped with Betadine. 1% Lidocaine was used for local anesthesia. Lumbar puncture was attempted at the L2- L3 level using a 5 inch 22 gauge needle, no CSF return. Additional lumbar puncture was attempted at the level of L1-L2, unsuccessful. Dr. Lowella Dandy was called to the procedure room for assistance, however, patient was experiencing dyspneic and became diaphoretic, patient asked to stop the procedure. The procedure was terminated. MR L-spine from 04/28/2023 reviewed by Dr. Lowella Dandy, it appears that patient does not have much CSF in the lumbar spine. IMPRESSION: Unsuccessful lumbar puncture.  Myelogram was not performed. This procedure was performed by Lawernce Ion, PA-C under the supervision of Richarda Overlie, MD. Electronically Signed   By: Richarda Overlie M.D.   On: 06/12/2023 16:08   CT CERVICAL SPINE W CONTRAST Result Date: 06/12/2023 CLINICAL DATA:  Cervical spinal stenosis EXAM: CT CERVICAL SPINE WITH CONTRAST TECHNIQUE: Multidetector CT imaging of the cervical spine was performed during intravenous contrast administration. Multiplanar CT image reconstructions were also generated. RADIATION DOSE REDUCTION: This exam was performed according to the departmental dose-optimization program which includes automated exposure control, adjustment of the mA and/or kV according to patient size and/or use of iterative reconstruction technique. CONTRAST:  75mL OMNIPAQUE IOHEXOL 350 MG/ML SOLN COMPARISON:  None Available. FINDINGS: Alignment: Normal. Skull base and vertebrae: Multilevel vertebral body height loss  throughout the cervical spine. Flowing anterior osteophytes along the length of the cervical spine. C6-T1 ACDF. Soft tissues and spinal canal: Postsurgical changes of left anterior approach for cervical spine fusion. Low-attenuation collection ventral to the spinal column at the C6-T2 levels. Disc levels:  No high-grade bony spinal canal stenosis. Upper chest: Negative Other: None. IMPRESSION: 1. Postsurgical changes of left anterior approach for cervical spine fusion. Low-attenuation collection ventral to the spinal column at the C6-T2 levels is nonspecific, without specific features of infection. 2. No high-grade bony spinal canal stenosis. Electronically Signed   By: Deatra Robinson M.D.   On: 06/12/2023 02:26   MR CERVICAL SPINE WO CONTRAST Result Date: 06/11/2023 CLINICAL DATA:  Spinal cord injury, follow up; Spinal cord injury, follow up New SCI with increasing spasticity, new neurogenic bowel and bladder since surgery EXAM: MRI CERVICAL AND THORACIC SPINE WITHOUT CONTRAST TECHNIQUE: Multiplanar and multiecho pulse sequences of the cervical spine, to include the craniocervical junction and cervicothoracic junction, and the thoracic spine, were obtained without intravenous contrast. COMPARISON:  MR C SPine 05/10/23 FINDINGS: Limitations: Limited exam due to the degree of motion artifact. MRI CERVICAL SPINE FINDINGS Alignment: Physiologic. Vertebrae: Status post C5-C7 ACDF. The degree of motion artifact markedly limits the ability to assess for the presence of acute osseous abnormality or hardware failure. There is prevertebral soft tissue edema of the C7-T3 levels, favored to be postsurgical. Cord: There is persistent T2 hyperintense signal abnormality within the spinal cord at the C7-T1 level. Posterior Fossa,  vertebral arteries, paraspinal tissues: Prevertebral soft tissue edema of the C6-T3 levels is favored to be postsurgical. There is likely a small hematoma in the left lateral (series [id number]. Disc levels:  The degree of motion artifact precludes detailed characterization of the degree of spinal canal stenosis. There is persistent severe spinal canal stenosis of the C6-T1 levels. MRI THORACIC SPINE FINDINGS Alignment:  Physiologic. Vertebrae: No fracture, evidence of discitis, or bone lesion. Cord:  Normal signal and morphology. Paraspinal and other soft tissues: Small amount of layering fluid in the esophagus, which places the patient at risk for aspiration. There is a posterior mediastinal hematoma which likely causes mass effect on the posterior esophagus. Correlate for symptoms of dysphagia. Disc levels: There are multilevel small degenerative disc bulges in the thoracic spine, most notably at T6-T7, T7-T8, T11-T12, T12-L1. There is likely moderate spinal canal narrowing at T6-T7. Likely severe right and moderate left neural foraminal narrowing at T11-T12 IMPRESSION: 1. Limited exam due to the degree of motion artifact. Within this limitation, there is persistent severe spinal canal stenosis of the C6-T1 levels with associated T2 hyperintense cord signal abnormality at the C7-T1 level, unchanged from prior. Repeat exam is recommended if there is concern for progressive stenosis or compressive myelopathy. 2. Posterior mediastinal hematoma which likely causes mass effect on the posterior esophagus. Correlate for symptoms of dysphagia. 3. Prevertebral soft tissue edema of the C6-T3 levels is favored to be postsurgical. There is likely a small hematoma in the left lateral neck. 4. No acute abnormality in the thoracic spine. Thoracic spine degenerative changes, as above. Electronically Signed   By: Lorenza Cambridge M.D.   On: 06/11/2023 12:04   MR THORACIC SPINE WO CONTRAST Result Date: 06/11/2023 CLINICAL DATA:  Spinal cord injury, follow up; Spinal cord injury, follow up New SCI with increasing spasticity, new neurogenic bowel and bladder since surgery EXAM: MRI CERVICAL AND THORACIC SPINE WITHOUT CONTRAST TECHNIQUE:  Multiplanar and multiecho pulse sequences of the cervical spine, to include the craniocervical junction and cervicothoracic junction, and the thoracic spine, were obtained without intravenous contrast. COMPARISON:  MR C SPine 05/10/23 FINDINGS: Limitations: Limited exam due to the degree of motion artifact. MRI CERVICAL SPINE FINDINGS Alignment: Physiologic. Vertebrae: Status post C5-C7 ACDF. The degree of motion artifact markedly limits the ability to assess for the presence of acute osseous abnormality or hardware failure. There is prevertebral soft tissue edema of the C7-T3 levels, favored to be postsurgical. Cord: There is persistent T2 hyperintense signal abnormality within the spinal cord at the C7-T1 level. Posterior Fossa, vertebral arteries, paraspinal tissues: Prevertebral soft tissue edema of the C6-T3 levels is favored to be postsurgical. There is likely a small hematoma in the left lateral (series [id number]. Disc levels: The degree of motion artifact precludes detailed characterization of the degree of spinal canal stenosis. There is persistent severe spinal canal stenosis of the C6-T1 levels. MRI THORACIC SPINE FINDINGS Alignment:  Physiologic. Vertebrae: No fracture, evidence of discitis, or bone lesion. Cord:  Normal signal and morphology. Paraspinal and other soft tissues: Small amount of layering fluid in the esophagus, which places the patient at risk for aspiration. There is a posterior mediastinal hematoma which likely causes mass effect on the posterior esophagus. Correlate for symptoms of dysphagia. Disc levels: There are multilevel small degenerative disc bulges in the thoracic spine, most notably at T6-T7, T7-T8, T11-T12, T12-L1. There is likely moderate spinal canal narrowing at T6-T7. Likely severe right and moderate left neural foraminal narrowing at T11-T12 IMPRESSION: 1. Limited  exam due to the degree of motion artifact. Within this limitation, there is persistent severe spinal canal stenosis  of the C6-T1 levels with associated T2 hyperintense cord signal abnormality at the C7-T1 level, unchanged from prior. Repeat exam is recommended if there is concern for progressive stenosis or compressive myelopathy. 2. Posterior mediastinal hematoma which likely causes mass effect on the posterior esophagus. Correlate for symptoms of dysphagia. 3. Prevertebral soft tissue edema of the C6-T3 levels is favored to be postsurgical. There is likely a small hematoma in the left lateral neck. 4. No acute abnormality in the thoracic spine. Thoracic spine degenerative changes, as above. Electronically Signed   By: Lorenza Cambridge M.D.   On: 06/11/2023 12:04    Recent Labs    06/12/23 2128  WBC 8.0  HGB 11.3*  HCT 31.7*  PLT 292   Recent Labs    06/12/23 2128  NA 137  K 3.8  CL 102  CO2 26  GLUCOSE 167*  BUN 10  CREATININE 0.80  CALCIUM 9.5    Intake/Output Summary (Last 24 hours) at 06/13/2023 1033 Last data filed at 06/13/2023 0800 Gross per 24 hour  Intake 1098.33 ml  Output 1405 ml  Net -306.67 ml        Physical Exam: Vital Signs Blood pressure 121/69, pulse 100, temperature 100.3 F (37.9 C), temperature source Oral, resp. rate 18, height 5\' 9"  (1.753 m), weight 93 kg, SpO2 100%.      General: awake, alert, but a little sleepy;  looks like feed bad- ill appearing- ;  fiance' at bedside being good advocate;  NAD HENT: conjugate gaze; oropharynx a little dry CV: regular rate and rhythm- not tachycardic at this time; no JVD Pulmonary: decreased at bases B/L- no W/R/R heard GI: soft, NT, ND, (+)BS- hypoactive Psychiatric: frustrated because feels bad Neurological: Ox3=- a little sleepy  MAS of 3-4 in LE's- fewer spasms still- but significant extensor tone Neurological:        Fairly alert and oriented x 3. Normal insight and awareness. Intact Memory. Normal language and speech. Cranial nerve exam unremarkable. MMT: RUE 5/5. LUE 5/5 deltoid, biceps, 4+ triceps, wrist  extensors, hand intrinsics. Pt appears to be able to move all muscle both lower extremities but MMT remains inhibited by spasticity. Pt with decreased LT in finger tips and from inguinal region downard. DTR's 3+, Tone MAS 3 in LE's   Assessment/Plan: 1. Functional deficits which require 3+ hours per day of interdisciplinary therapy in a comprehensive inpatient rehab setting. Physiatrist is providing close team supervision and 24 hour management of active medical problems listed below. Physiatrist and rehab team continue to assess barriers to discharge/monitor patient progress toward functional and medical goals  Care Tool:  Bathing    Body parts bathed by patient: Right arm, Left arm, Chest, Abdomen, Front perineal area, Right upper leg, Left upper leg, Face   Body parts bathed by helper: Left lower leg, Right lower leg, Buttocks     Bathing assist Assist Level: Moderate Assistance - Patient 50 - 74% (based on simulated task and his inability to demonstrate come from sit to stand for LB task performance.)     Upper Body Dressing/Undressing Upper body dressing   What is the patient wearing?: Pull over shirt    Upper body assist Assist Level: Set up assist (using modified technique)    Lower Body Dressing/Undressing Lower body dressing      What is the patient wearing?: Pants, Incontinence brief  Lower body assist Assist for lower body dressing: Total Assistance - Patient < 25%     Toileting Toileting    Toileting assist Assist for toileting: Total Assistance - Patient < 25%     Transfers Chair/bed transfer  Transfers assist  Chair/bed transfer activity did not occur: Safety/medical concerns (unsafe to get up)  Chair/bed transfer assist level: 2 Helpers     Locomotion Ambulation   Ambulation assist   Ambulation activity did not occur: Safety/medical concerns          Walk 10 feet activity   Assist  Walk 10 feet activity did not occur: Safety/medical  concerns        Walk 50 feet activity   Assist Walk 50 feet with 2 turns activity did not occur: Safety/medical concerns         Walk 150 feet activity   Assist           Walk 10 feet on uneven surface  activity   Assist Walk 10 feet on uneven surfaces activity did not occur: Safety/medical concerns         Wheelchair     Assist Is the patient using a wheelchair?: Yes (was not safe to attempt transferring into w/c at this time, but yes will use w/c) Type of Wheelchair: Manual Wheelchair activity did not occur: Safety/medical concerns         Wheelchair 50 feet with 2 turns activity    Assist    Wheelchair 50 feet with 2 turns activity did not occur: Safety/medical concerns       Wheelchair 150 feet activity     Assist  Wheelchair 150 feet activity did not occur: Safety/medical concerns       Blood pressure 121/69, pulse 100, temperature 100.3 F (37.9 C), temperature source Oral, resp. rate 18, height 5\' 9"  (1.753 m), weight 93 kg, SpO2 100%.  Medical Problem List and Plan: 1. Functional deficits secondary to chronic C6-T1 cervical myelopathy s/p ACDF 06/05/2023             -patient may shower with collar on. Will order replacement pads             -ELOS/Goals: 18-24 days, goals are mod I to supervision with self-care and mobility   Admit to CIR- first day of evaluations- difficulty tolerating due to lack of sleep as well severe spasticity  - needed significant titration of spasticity agents  D/c 07/05/23  Con't CIR PT and OT- on hold today  Called IM about pt and his fever, elevated BP and HR- looks like has pneumonia- started on Cefepime and Vanc- wait to give till gets blood C'x.s  2.  Antithrombotics: -DVT/anticoagulation:  Pharmaceutical: 12/15- Lovenox 2x/day  -but held today due to hematuria- Dopplers negative 12/17- held Lovenox for pt today/stopped it, just in case he needs surgery? 12/19- restarted Lovenox since no LP  today/tomorrow due to increased risk of DVT              -antiplatelet therapy: none   3. Pain Management: Tylenol as needed;   Norco 5-10 mg q 4 hours prn             - see spasticity discussion below  12/15- c/o neck pain- not sure if meds enough- if doesn't improve by tomorrow, can increase to Oxycodone if need be.   12/16- didn't complain of neck pain today  12/18- asleep so hard to tell if hurting more  12/19- pain worse in back since  attempted LP- doesn't want again unless they "knock him out" Added MS Contin 15 mg BID for pain control- con't Norco prn 4. Mood/Behavior/Sleep: LCSW to evaluate and provide emotional support             -antipsychotic agents: n/a   5. Neuropsych/cognition: This patient is capable of making decisions on his own behalf.   6. Skin/Wound Care: Routine skin care checks   7. Fluids/Electrolytes/Nutrition: Routine Is and Os and follow-up chemistries   8. Significant LE spasticity:             -increased baclofen to 20mg  q8 hours today. Pt states that it's helping but that spasticity increases over the hour or so prior to the subsequent dose. Consider increasing to q6 hours over weekend BP/SE permitting             -valium 5mg  q6 prn             -rom, activity with PT  12/14- spasticity still severe- no improvement with "robaxin, flexeril"- will change Valium to 5 mg q6 Hours- and see if can tolerate- since more spasms, than tone- and added Zanaflex 4 mg q6 hours prn- for more spasms.   12/16--tone present and still painful for him. It is better than last week, however. Will hold on further adjustment in meds regardless as he is feeling some disorientation   -may need to reduce valium.   -consider increasing baclofen to q6  12/17- changed Baclofen to 20 mg q6 hours since having cognitive changes due ot meds  12/18- slept better due to less pain/spasms  12/19- spasms increased- likely due to infection 9. Neurogenic bowel:             -he is continent              -last bm yesterday             -senna-s 2 at bedtime, observe for pattern  12/14- LBM 2 days ago- likely constipation in setting of neurogenic bowel- if no BM by tomorrow, will intervene  12/15- Ordered Sorbitol for bowels- 30cc after therapy- also suppository if no BM. Increased Senna to 2 tabs BID from daily since not having regular BM's  12/16 having flatus, still no bm (despite 12/15 on "last bm date")   -60 cc sorbitol today   -SSE in PM if still no bm  12/17- had multiple Bms yesterday, but all incontinent- might need bowel program, but wil wait til see MRI's  12/18 - started bowel program with suppository since was using at home per wife- will d/w pt more tomorrow- didn't want to wake up this AM  12/19- wife feels pt is full of stool- and still oozing- asked for something to clean him out- ordered Sorbitol.  10. Neurogenic bladder:             -pt with frequency, generally continent. He does have some incontinence at night.             -unsure of PVR's.             -begin timed voids while awake             -check PVR's  12/14- ordered bladder scans- q6 hours- to make sure no problems with retention- will order for 3 days-  12/15- only Bladder scan is 113cc- so retaining some. Will con't to follow  11. Hematuria  12/15- blood tinged urine and bleeding from meatus- will check U/A and Cx-  told nursing to collect urine ASAP and let me know when back- of note, no trauma per pt/wife, no caths.   12/16 still some blood, ua neg, ucx pending, hgb with sl drop to 11.1   -likely d/t mild trauma during IC's.    -use coude cath   -lovenox held   -will add flomax. Hopefully bp can support  12/17- stopped Lovenox for now- and flomax started yesterday- had 1 incontinent void yesterday- didn't feel it- concerned about pt's worsening of B/B. 12/19- no more hematuria even with caths  12. Cough  12/15- sounds like upper airway sounds on exam- will add Mucinex 600 mg BID and cough drops prn.  13.  Pneumonia/infection?  12/19- pt has likely pneumonia based on CT of chest- CTA (-) for PE, but appears to have pneumonia-IM saw pt and added Cefipime and Vanc- went over with wife and pt- also added Blood Cx's-  before starting ABX and U/A and Cx- just to make sure not missing a sourc eof infection- also spoke with IM resident and attending about pt.   I spent a total of 59   minutes on total care today- >50% coordination of care- due to  D/w IM x2 attending and resident- and nursing x3 and NSU. Also complex medical decision making.    LOS: 6 days A FACE TO FACE EVALUATION WAS PERFORMED  Lesta Limbert 06/13/2023, 10:33 AM

## 2023-06-13 NOTE — Significant Event (Signed)
Rapid Response Event Note   Reason for Call :  Fever, tachycardia. RED MEWS  Per nursing staff, PTA RRT, pt having rigors with T-101.9. Tylenol 325mg  PO was given and Rehab MD was notified.   Initial Focused Assessment:  Pt sitting up in bed in no visible distress. He is alert and oriented.  He is c/o being unable to take a deep breath and ABD pain. Lungs clear t/o, decreased in the bases. ABD large/distended, bowel sounds WNL. Per patient he had a small BM today. Skin hot to touch.  T-102.9, HR-141, BP-135/115, 18, SpO2-94%  Interventions:  Tylenol given per PRN order  Plan of Care:  Sepsis workup started this AM including BC, U/A,  vanc/maxipime. 2L bolus given last night d/t LA-2.4. Pt remains febrile and tachycardic. Pt may benefit from moving to inpatient tele unit for closer monitoring while sepsis w/o pending. Rehab PA reaching out to IMTS who are already consulted.  Please call RRT if further assistance needed.  Event Summary:   MD Notified: Dois Davenport, Rehab PA notified by bedside RN, Dr. Daiva Eves notified by Dois Davenport.  Call Time:1942 Arrival [id number] End [id number]  Terrilyn Saver, RN

## 2023-06-13 NOTE — Progress Notes (Signed)
U/A has resulted. MD notified.

## 2023-06-13 NOTE — Progress Notes (Addendum)
Followed up with phlebotomy [id number] culture before antibiotics.

## 2023-06-13 NOTE — Progress Notes (Addendum)
Received message from Dr. Darreld Mclean about Leon Rodriguez, who is assigned to the IMTS.  Dr. Allena Katz was called by CIR to see this patient as a consult for fever and tachycardia.Patient is is s/p ACDF on 12/11, worsened neurological status over the last 1-2 days and unable to move LEs for which neurosurgery has been consulted. Infectious workup in process. CXR clear. D-dimer is elevated so I have ordered CTA chest PE study which is pending. CTA myelogram has also been ordered.  [facility] will continue care overnight.  IMTS will follow up at 7AM as consultant vs take over pending clinical needs as patient progresses overnight. Appreciate Dr. Eliane Decree assistance with this consult.  Morene Crocker, MD [facility] Internal Medicine Program - PGY-2 06/13/2023, 12:24 AM Please contact the on call pager after 5 pm and on weekends

## 2023-06-13 NOTE — Progress Notes (Signed)
   Providing Compassionate, Quality Care - Together  NEUROSURGERY PROGRESS NOTE   S: tachycardia ON with fevers, CT PE neg, ?PNA  O: EXAM:  BP 91/64 (BP Location: Right Arm)   Pulse (!) 117   Temp 98.7 F (37.1 C)   Resp 14   Ht 5\' 9"  (1.753 m)   Wt 93 kg   SpO2 100%   BMI 30.27 kg/m   Sleeping, easily arousable Speech fluent appropriate, PERRLA Neck is soft, trachea midline Normal phonation Cervical collar in place Dressing clean dry and intact Bilateral upper extremities 4+/5 throughout Bilateral lower extremities remain weak, 2-3/5; his spasticity seems decreased Positive clonus bilateral lower extremities  ASSESSMENT:  57 y.o. 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  -Possible pneumonia, currently on antibiotics -We will hold his CT myelogram until he improves -Appreciate [facility] and rehab management -Please do not hesitate to call with questions or concerns -Wife updated at bedside    Thank you for allowing me to participate in this patient's care.  Please do not hesitate to call with questions or concerns.   Monia Pouch, DO Neurosurgeon [facility] [phone]

## 2023-06-13 NOTE — H&P (Signed)
Date: 06/14/2023               Patient Name:  Leon Rodriguez MRN: [id number]  DOB: 23-Apr-1966 Age / Sex: 57 y.o., male   PCP: Kathleen Lime, MD         Medical Service: Internal Medicine Teaching Service         Attending Physician: Dr. Reymundo Poll, MD      First Contact: Dr. Faith Rogue, DO Pager [phone]    Second Contact: Dr. Marrianne Mood, MD Pager [phone]         After Hours (After 5p/  First Contact Pager: [phone]  weekends / holidays): Second Contact Pager: [phone]   SUBJECTIVE   Chief Complaint: Fever  History of Present Illness: Leon Rodriguez is a 57 yo M with cervical spinal stenosis with compression s/p anterior cervical discectomy and fusion C6-T1 on 12/11 c/b with new neurogenic bladder and LE spasticity transitioned to CIR service on 12/13. IMTS was initially involved in care of patient as consultant to star sepsis evaluation, now being transferred to our service for hemodynamic monitoring, symptomatic management, and sepsis rule out  Initial IMTS consult for tachycardia and fever on 12/19. Infectious work up and IV antibiotics started at 1PM. Night covering resident received page about patient rigors, fever, and worsening tachycardia. Rapid response team paged to bedside; patient with HR to 141 and BP 135/115, SPO2 >94% on RA and without distress. Patient given one dose of 1000 mg tylenol.    At bedside, patient was sitting up in bed in NAD, conversant, watching TV. Denies chest pain, shortness of breath, or abdominal discomfort. Patient denied generalized pain. No orthopnea or PND.   Patient denies suprapubic on flank pain. Patient has a history of urinary incontinence first noted on 11/3, prior to neurosurgery. He has been wearing adults briefs at home. No penile discharge, ulcers.   Has been in/out catheterized multiple times during this admission. Two days ago there was a traumatic episode with blood during catheterization. No blood since in his adult  briefs.   Of note, since his admission to CIR, patient has worsening of neurological status wiith bilateral lower extremity weakness for which neurosurgery was consulted. Unsucessful CT  cervical myelogram due to patient spasms. He reports that has regained some function in his RLE compared to two days ago but he remains unable to move L leg against gravity. His prior functional status was full mobility without assistance   Past Medical History Bilateral hip osteoarthritis Hiatal Hernia Lumbar canal stenosis and foraminal canal stenosis Urinary retention and incontinence Cervical myelopathy   Meds:  Duloxetine 60 mg daily, does not remember taking this Cyclobenzaprine 10 gm PRN Robaxin 500 mg PRN  Past Surgical History:  Procedure Laterality Date   ANTERIOR CERVICAL DECOMP/DISCECTOMY FUSION N/A 06/05/2023   Procedure: ANTERIOR CERVICAL DISCECTOMY AND FUSION CERVICAL SIX-SEVEN/CERVICAL SEVEN-THORACIC ONE;  Surgeon: Bethann Goo, DO;  Location: [facility] OR;  Service: Neurosurgery;  Laterality: N/A;  3C   APPENDECTOMY     CIRCUMCISION     HIATAL HERNIA REPAIR     HIP SURGERY     at 67 and 30 yo    Social:  Lives With: girlfriend [name] in [place], [place]. Support: family Level of Function: Independent with iADLs and ADLs prior to this admission PCP: Dr. Mickie Bail vs Deboraha Sprang - will need to clarify this  Substances: 2 cigars per day. 1-2 beers most days. THC products most days. No IV or other inhaled substaces  Family  History: NA  Allergies: Allergies as of 06/13/2023   (No Known Allergies)    Review of Systems: A complete ROS was negative except as per HPI.   OBJECTIVE:   Physical Exam: Blood pressure 96/67, pulse (!) 105, temperature 98.6 F (37 C), resp. rate 20, SpO2 100%.  Constitutional: well-appearing man sitting in laying, in no acute distress HENT: normocephalic atraumatic, mucous membranes moist. Anterior neck bandage without drainage, tenderness or  fluctuance Cardiovascular: Tachycardia, no m/r/g, no JVD Pulmonary/Chest: normal work of breathing on room air, lungs clear to auscultation bilaterally anteriorly. Exam limited by cervical collar. No crackles or wheezes Abdominal: soft, non-tender, non-distended. NO No fluid wave no asterixis Neurological: alert & oriented x 3, conversing appropriately. Face symmetric, BUE 5/5 in strength, RLE 5/3, LLE 0-1/5 MSK: no gross abnormalities. Trace bilateral edema, non pitting edema Skin: warm and dry Psych: Normal mood and affect  Labs: CBC    Component Value Date/Time   WBC 6.5 06/13/2023 2332   RBC 3.14 (L) 06/13/2023 2332   HGB 10.8 (L) 06/13/2023 2332   HCT 30.4 (L) 06/13/2023 2332   PLT 273 06/13/2023 2332   MCV 96.8 06/13/2023 2332   MCH 34.4 (H) 06/13/2023 2332   MCHC 35.5 06/13/2023 2332   RDW 12.8 06/13/2023 2332   LYMPHSABS 1.0 06/13/2023 2332   MONOABS 0.5 06/13/2023 2332   EOSABS 0.0 06/13/2023 2332   BASOSABS 0.0 06/13/2023 2332     CMP     Component Value Date/Time   NA 137 06/12/2023 2128   K 3.8 06/12/2023 2128   CL 102 06/12/2023 2128   CO2 26 06/12/2023 2128   GLUCOSE 167 (H) 06/12/2023 2128   BUN 10 06/12/2023 2128   CREATININE 0.78 06/13/2023 2332   CALCIUM 9.5 06/12/2023 2128   PROT 6.0 (L) 06/12/2023 2128   ALBUMIN 3.0 (L) 06/12/2023 2128   AST 58 (H) 06/12/2023 2128   ALT 49 (H) 06/12/2023 2128   ALKPHOS 106 06/12/2023 2128   BILITOT 0.9 06/12/2023 2128   GFRNONAA >60 06/13/2023 2332   GFRAA  11/09/2008 1258    >60        The eGFR has been calculated using the MDRD equation. This calculation has not been validated in all clinical situations. eGFR's persistently <60 mL/min signify possible Chronic Kidney Disease.    Imaging: CT Angio Chest Pulmonary Embolism (PE) W or WO Contrast Result Date: 06/13/2023 IMPRESSION: 1. No evidence for pulmonary embolism. 2. Ill-defined ground-glass nodular densities in the inferior right lower lobe and  patchy ground-glass opacities in both lower lobes, likely infectious/inflammatory. 3. Anterior fusion hardware at C6 and C7 with prevertebral swelling and edema measuring up to 2 cm. Findings may be postsurgical in nature. 4. Subcutaneous edema in the anterior mid neck with overlying skin staples. No discrete fluid collection or soft tissue gas identified. Electronically Signed   By: Darliss Cheney M.D.   On: 06/13/2023 02:09      EKG: personally reviewed my interpretation is sinus tachycardia.  ASSESSMENT & PLAN:   Assessment & Plan by Problem: Principal Problem:   Sepsis (HCC) Active Problems:   Cervical myelopathy (HCC)   Neurogenic bladder   Lumbar canal stenosis   Lower extremity weakness   Muscle spasticity   Sepsis rule out Not clear source yet. CT PE negative for emblism but with bilateral lower lobe ground glass opacities concerning for infection however, patient without hypoxic or respiratory symptoms. U/A with pyuria and bacteriuria. Recent ACDF on 12/11 without overt infectious  signs. Started on IV Cefepime and vancomycin at 1PM on 12/19; will hold off on changing antibiotic at this time. Suspect urinary source of infection given new neurogenic bladder and hx of retention s/p recent hx of catheter trauma with potential bacterial translocation. EKG in the room with sinus tachycardia, improved after 1L IVF otherwise hemodynamically stable and without leukocytosis. Blood and urine cultures sill pending.  -Transferred patient to progressive floor - Continue on IV Cefepime and vancomycin - Follow up blood and urine cultures - S/p 1L IV LR; on cardiac telemetry, monitor VS - Tylenol 1000 mg q8H for fever  Neurogenic bladder Urinary retention Seen on MRI lumbar spine on 04/28/2023 for evaluation of cauda equina, urinary incontinence, and LE spasms with history of incontinence and retention. High dose anticholinergics likely contributing to worsening retension. - Continue Tamsulosin  0.4 ng daily; hold if hypotensive - Now with Coude catheter inserted - I/Os  Cervical myelopathy  Functional deconditioning s/p ACDF C6/T1 on 06/05/23 after presenting with cord compression c/b by worsening of LE strength during CIR admission. CT cervical spine with contrast without changes on 12/17. Attempt at repeat Ct myelogram unsuccessful due to ongoing spasms. RLE weakness slowly improving. - Appreciate neurosurgery recommendations - On Noco/vicodin 5-325 mg q4 PRN - MS contin 15 mg 12HR BID - Baclofen and tizanidine per PM&R doctor, continue - Previously on Cymbalta; did not discuss with patient why it was discontinued but may benefit from reinitiation during this admission - PT/OT - Would benefit from repeat CT myelogram vs repeat MRI lumbar spine  L5-S1 canal stenosis and BL foraminal stenosis Pain medication as above  Constipation Miralax, Senokot-S, and Fleet enema PRN  Diet: Heart Healthy VTE: Enoxaparin Code: Full   Barriers to Discharge: Clinical improvement Dispo: Admit patient to Observation with expected length of stay less than 2 midnights.  Signed:   Morene Crocker, MD Internal Medicine Resident PGY-2   Please contact the on call pager after 5 pm and on weekends at [phone].

## 2023-06-13 NOTE — Progress Notes (Signed)
Received patient on yellow mews re: increase heart rate ; low grade temperature. MD is aware new orders noted. Antibiotics ordered after blood cultures. Continued to monitor.

## 2023-06-13 NOTE — Progress Notes (Signed)
Inpatient Rehabilitation Discharge Medication Review by a Pharmacist  A complete drug regimen review was completed for this patient to identify any potential clinically significant medication issues.  High Risk Drug Classes Is patient taking? Indication by Medication  Antipsychotic No   Anticoagulant Yes Enoxaparin for DVT ppx  Antibiotic Yes, as an intravenous medication Vancomycin, cefepime for PNA  Opioid Yes Norco, MS Contin for pain   Antiplatelet No   Hypoglycemics/insulin No   Vasoactive Medication No   Chemotherapy No   Other Yes Acetaminophen for mild pain Baclofen, diazepam, tizanidine for muscle spasms Senna-docusate, Miralax, Fleet, bisacodyl for constipation Maalox for indigestion Mucinex for cough Ondansetron for nausea Tamsulosin for neurogenic bladder     Type of Medication Issue Identified Description of Issue Recommendation(s)  Drug Interaction(s) (clinically significant)     Duplicate Therapy     Allergy     No Medication Administration End Date     Incorrect Dose     Additional Drug Therapy Needed     Significant med changes from prior encounter (inform family/care partners about these prior to discharge). HELD home famotidine prn Restart if needed   Other       Clinically significant medication issues were identified that warrant physician communication and completion of prescribed/recommended actions by midnight of the next day:  No  Name of provider notified for urgent issues identified:   Provider Method of Notification:     Pharmacist comments:   Time spent performing this drug regimen review (minutes):  15  Thank you for involving pharmacy in this patient's care.  Loura Back, PharmD, BCPS Clinical Pharmacist Clinical phone for 06/13/2023 is [phone] 06/13/2023 9:35 PM

## 2023-06-13 NOTE — Progress Notes (Addendum)
RN was going to do bowel program. Patient was shivering vital signs done; T101.9 pt was red mews . Tylenol given per order. Dois Davenport PA notified. Red Mews protocol implemented.

## 2023-06-13 NOTE — Progress Notes (Addendum)
Pharmacy Antibiotic Note  Leon Rodriguez is a 57 y.o. male admitted on 06/07/2023 with pneumonia, suggested by presence of ground glass opacities in the RLL, concerning for infectious/inflammatory etiology. Marland Kitchen  Pharmacy has been consulted for vancomycin and cefepime dosing.  Plan: Vancomycin 1500 milligrams IV every 12 hours.  Goal trough 10-15 mcg/mL. Calculated AUC 509 mcg*h/mL Calculated Css Max 34.4 mcg/mL Calculated Css Min 13.6 mcg/mL  Height: 5\' 9"  (175.3 cm) Weight: 93 kg (205 lb) IBW/kg (Calculated) : 70.7  Temp (24hrs), Avg:100.2 F (37.9 C), Min:98.6 F (37 C), Max:103 F (39.4 C)  Recent Labs  Lab 06/08/23 0539 06/10/23 0520 06/12/23 2128 06/12/23 2223  WBC 9.0 7.6 8.0  --   CREATININE 0.71 0.56* 0.80  --   LATICACIDVEN  --   --  2.4* 2.0*    Estimated Creatinine Clearance: 113.3 mL/min (by C-G formula based on SCr of 0.8 mg/dL).    No Known Allergies  Antimicrobials this admission: Cefepime 2 gm IV q8h  12/19 >>  Vancomycin 1500 mg IV q12h 12/19 >>   Microbiology results: 12/19 BCx: In process 12/19 UCx: In process  12/10 MRSA PCR: Negative  Thank you for allowing pharmacy to be a part of this patient's care.  Elicia Lamp, PharmD, CPP 06/13/2023 9:56 AM

## 2023-06-13 NOTE — Progress Notes (Signed)
Occupational Therapy Note  Patient Details  Name: Leon Rodriguez MRN: [id number] Date of Birth: [birth year]  Today's Date: 06/13/2023 OT Missed Time: 45 Minutes Missed Time Reason: MD hold (comment) (pt not medically able to participate)  Pt missed 45 mins skilled OT services. Per MD, pt not medically appropriate for therapy this morning.   Lavone Neri [facility] 06/13/2023, 8:26 AM

## 2023-06-13 NOTE — Progress Notes (Signed)
Patient remains A&Ox4 throughout shift.   Tolerated going to CT after given ativan.  Cultures collected but urine culture is pending. MD is following.

## 2023-06-14 ENCOUNTER — Encounter (HOSPITAL_COMMUNITY): Payer: Self-pay | Admitting: Internal Medicine

## 2023-06-14 DIAGNOSIS — T8383XA Hemorrhage of genitourinary prosthetic devices, implants and grafts, initial encounter: Secondary | ICD-10-CM | POA: Diagnosis present

## 2023-06-14 DIAGNOSIS — A4159 Other Gram-negative sepsis: Secondary | ICD-10-CM | POA: Diagnosis present

## 2023-06-14 DIAGNOSIS — N3000 Acute cystitis without hematuria: Secondary | ICD-10-CM

## 2023-06-14 DIAGNOSIS — N319 Neuromuscular dysfunction of bladder, unspecified: Secondary | ICD-10-CM | POA: Diagnosis present

## 2023-06-14 DIAGNOSIS — Z993 Dependence on wheelchair: Secondary | ICD-10-CM | POA: Diagnosis not present

## 2023-06-14 DIAGNOSIS — M62830 Muscle spasm of back: Secondary | ICD-10-CM | POA: Diagnosis present

## 2023-06-14 DIAGNOSIS — Z981 Arthrodesis status: Secondary | ICD-10-CM | POA: Diagnosis not present

## 2023-06-14 DIAGNOSIS — G959 Disease of spinal cord, unspecified: Secondary | ICD-10-CM | POA: Diagnosis not present

## 2023-06-14 DIAGNOSIS — A419 Sepsis, unspecified organism: Secondary | ICD-10-CM | POA: Diagnosis present

## 2023-06-14 DIAGNOSIS — Y658 Other specified misadventures during surgical and medical care: Secondary | ICD-10-CM | POA: Diagnosis present

## 2023-06-14 DIAGNOSIS — R5381 Other malaise: Secondary | ICD-10-CM | POA: Diagnosis not present

## 2023-06-14 DIAGNOSIS — K5900 Constipation, unspecified: Secondary | ICD-10-CM | POA: Diagnosis present

## 2023-06-14 DIAGNOSIS — M4803 Spinal stenosis, cervicothoracic region: Secondary | ICD-10-CM | POA: Diagnosis not present

## 2023-06-14 DIAGNOSIS — M62838 Other muscle spasm: Secondary | ICD-10-CM | POA: Insufficient documentation

## 2023-06-14 DIAGNOSIS — M4807 Spinal stenosis, lumbosacral region: Secondary | ICD-10-CM | POA: Diagnosis present

## 2023-06-14 DIAGNOSIS — M4802 Spinal stenosis, cervical region: Secondary | ICD-10-CM | POA: Diagnosis present

## 2023-06-14 DIAGNOSIS — F17290 Nicotine dependence, other tobacco product, uncomplicated: Secondary | ICD-10-CM | POA: Diagnosis present

## 2023-06-14 DIAGNOSIS — Z7401 Bed confinement status: Secondary | ICD-10-CM | POA: Diagnosis not present

## 2023-06-14 DIAGNOSIS — E882 Lipomatosis, not elsewhere classified: Secondary | ICD-10-CM | POA: Diagnosis present

## 2023-06-14 DIAGNOSIS — R339 Retention of urine, unspecified: Secondary | ICD-10-CM | POA: Diagnosis present

## 2023-06-14 DIAGNOSIS — R7881 Bacteremia: Secondary | ICD-10-CM | POA: Diagnosis not present

## 2023-06-14 DIAGNOSIS — R29898 Other symptoms and signs involving the musculoskeletal system: Secondary | ICD-10-CM | POA: Insufficient documentation

## 2023-06-14 DIAGNOSIS — M48061 Spinal stenosis, lumbar region without neurogenic claudication: Secondary | ICD-10-CM | POA: Diagnosis present

## 2023-06-14 DIAGNOSIS — K592 Neurogenic bowel, not elsewhere classified: Secondary | ICD-10-CM | POA: Diagnosis present

## 2023-06-14 DIAGNOSIS — F17210 Nicotine dependence, cigarettes, uncomplicated: Secondary | ICD-10-CM | POA: Diagnosis present

## 2023-06-14 DIAGNOSIS — M160 Bilateral primary osteoarthritis of hip: Secondary | ICD-10-CM | POA: Diagnosis present

## 2023-06-14 DIAGNOSIS — M4712 Other spondylosis with myelopathy, cervical region: Secondary | ICD-10-CM | POA: Diagnosis present

## 2023-06-14 LAB — BLOOD CULTURE ID PANEL (REFLEXED) - BCID2

## 2023-06-14 LAB — CBC WITH DIFFERENTIAL/PLATELET
Abs Immature Granulocytes: 0.02 10*3/uL (ref 0.00–0.07)
Abs Immature Granulocytes: 0.03 10*3/uL (ref 0.00–0.07)
Basophils Absolute: 0 10*3/uL (ref 0.0–0.1)
Basophils Absolute: 0 10*3/uL (ref 0.0–0.1)
Basophils Relative: 1 %
Basophils Relative: 1 %
Eosinophils Absolute: 0 10*3/uL (ref 0.0–0.5)
Eosinophils Absolute: 0.1 10*3/uL (ref 0.0–0.5)
Eosinophils Relative: 1 %
Eosinophils Relative: 1 %
HCT: 28.1 % — ABNORMAL LOW (ref 39.0–52.0)
HCT: 30.4 % — ABNORMAL LOW (ref 39.0–52.0)
Hemoglobin: 10 g/dL — ABNORMAL LOW (ref 13.0–17.0)
Hemoglobin: 10.8 g/dL — ABNORMAL LOW (ref 13.0–17.0)
Immature Granulocytes: 0 %
Immature Granulocytes: 1 %
Lymphocytes Relative: 16 %
Lymphocytes Relative: 17 %
Lymphs Abs: 1 10*3/uL (ref 0.7–4.0)
Lymphs Abs: 1.1 10*3/uL (ref 0.7–4.0)
MCH: 34.1 pg — ABNORMAL HIGH (ref 26.0–34.0)
MCH: 34.4 pg — ABNORMAL HIGH (ref 26.0–34.0)
MCHC: 35.5 g/dL (ref 30.0–36.0)
MCHC: 35.6 g/dL (ref 30.0–36.0)
MCV: 95.9 fL (ref 80.0–100.0)
MCV: 96.8 fL (ref 80.0–100.0)
Monocytes Absolute: 0.5 10*3/uL (ref 0.1–1.0)
Monocytes Absolute: 0.5 10*3/uL (ref 0.1–1.0)
Monocytes Relative: 7 %
Monocytes Relative: 8 %
Neutro Abs: 4.5 10*3/uL (ref 1.7–7.7)
Neutro Abs: 4.9 10*3/uL (ref 1.7–7.7)
Neutrophils Relative %: 72 %
Neutrophils Relative %: 75 %
Platelets: 258 10*3/uL (ref 150–400)
Platelets: 273 10*3/uL (ref 150–400)
RBC: 2.93 MIL/uL — ABNORMAL LOW (ref 4.22–5.81)
RBC: 3.14 MIL/uL — ABNORMAL LOW (ref 4.22–5.81)
RDW: 12.7 % (ref 11.5–15.5)
RDW: 12.8 % (ref 11.5–15.5)
WBC: 6.3 10*3/uL (ref 4.0–10.5)
WBC: 6.5 10*3/uL (ref 4.0–10.5)
nRBC: 0 % (ref 0.0–0.2)
nRBC: 0 % (ref 0.0–0.2)

## 2023-06-14 LAB — CREATININE, SERUM
Creatinine, Ser: 0.78 mg/dL (ref 0.61–1.24)
GFR, Estimated: >60 mL/min (ref >60)

## 2023-06-14 LAB — BASIC METABOLIC PANEL
Anion gap: 7 (ref 5–15)
BUN: 8 mg/dL (ref 6–20)
CO2: 22 mmol/L (ref 22–32)
Calcium: 8.7 mg/dL — ABNORMAL LOW (ref 8.9–10.3)
Chloride: 107 mmol/L (ref 98–111)
Creatinine, Ser: 0.69 mg/dL (ref 0.61–1.24)
GFR, Estimated: >60 mL/min (ref >60)
Glucose, Bld: 116 mg/dL — ABNORMAL HIGH (ref 70–99)
Potassium: 3.9 mmol/L (ref 3.5–5.1)
Sodium: 136 mmol/L (ref 135–145)

## 2023-06-14 NOTE — Evaluation (Signed)
Occupational Therapy Evaluation Patient Details Name: Leon Rodriguez MRN: [id number] DOB: May 21, 1966 Today's Date: 06/14/2023   History of Present Illness Pt 57 yo male s/p C6-T1 ACDF on 12/11 for severe cervical stenosis with cord compression consistent with myelopathy. Pt discharged to CIR at [facility] on 12/13 and transfered back to acute care on 12/19 due to tachycardia, fever, and suspected sepsis. PMH includes OA, hiatal hernia repair, urinary retention and incontinence   Clinical Impression   Just prior to this admission, pt was receiving inpatient rehab through [facility] and was performing ADLs with Min to Total assist. At baseline, pt receives assistance from wife for dressing and bathing, primarily performed functional mobility from wheelchair level, and was able to ambulate short distances with RW to bathroom. Pt now presents with decreased activity tolerance, decreased sitting tolerance, decreased sitting balance, increased B LE tone and spasms affecting functional level, pain affecting functional level, and decreased safety and independence with functional tasks. Pt will benefit from acute skilled OT services to address deficits outlined below, decrease caregiver burden, and increase safety and independence with functional tasks. Post acute discharge, pt will benefit from continued intensive inpatient skilled rehab services > 3 hours per day to maximize rehab potential.       If plan is discharge home, recommend the following: Two people to help with walking and/or transfers;Two people to help with bathing/dressing/bathroom;Assistance with cooking/housework;Assist for transportation;Help with stairs or ramp for entrance    Functional Status Assessment  Patient has had a recent decline in their functional status and demonstrates the ability to make significant improvements in function in a reasonable and predictable amount of time.  Equipment Recommendations  Other (comment) (defer to next  level of care)    Recommendations for Other Services Rehab consult     Precautions / Restrictions Precautions Precautions: Fall;Cervical Precaution Comments: hx of many falls, severe B LE hypertonia with muscle spasms, verbally went through cervical precautions throughout session Required Braces or Orthoses: Cervical Brace Cervical Brace: Hard collar (out of bed) Restrictions Weight Bearing Restrictions Per Provider Order: No Other Position/Activity Restrictions: cervical precautions      Mobility Bed Mobility Overal bed mobility: Needs Assistance Bed Mobility: Supine to Sit, Sit to Supine   Sidelying to sit: Max assist, +2 for physical assistance, +2 for safety/equipment, HOB elevated Supine to sit: Max assist, +2 for physical assistance, +2 for safety/equipment, HOB elevated     General bed mobility comments: HOB up ~80 degrees pt requires assistance with LE and trunk. Pt is able to get trunk to upright position but tries to pull using B UE requiring re-direction to adhere to cervical precautions.    Transfers                   General transfer comment: unable at this time      Balance Overall balance assessment: Needs assistance, History of Falls Sitting-balance support: Single extremity supported, Bilateral upper extremity supported, No upper extremity supported, Feet supported Sitting balance-Leahy Scale: Zero Sitting balance - Comments: Pt currently requires Min A to Total A +2 for sitting upright on EOB with pillow support. Pt able to sit up for ~15 min on EOB intermittently leaning back on pillows for rest.                                   ADL either performed or assessed with clinical judgement   ADL Overall  ADL's : Needs assistance/impaired Eating/Feeding: Modified independent;Bed level   Grooming: Minimal assistance;Bed level (adhering to cervical precautions)   Upper Body Bathing: Maximal assistance;Bed level;Cueing for compensatory  techniques (adhering to cervical precautions)   Lower Body Bathing: Total assistance;+2 for physical assistance;+2 for safety/equipment;Bed level (adhering to cervical precautions)   Upper Body Dressing : Maximal assistance;Cueing for compensatory techniques;Bed level (adhering to cervical precautions)   Lower Body Dressing: Total assistance;Bed level Lower Body Dressing Details (indicate cue type and reason): for donning/doffing socks   Toilet Transfer Details (indicate cue type and reason): deferred this session for pt/therapist safety Toileting- Clothing Manipulation and Hygiene: Total assistance;+2 for safety/equipment;+2 for physical assistance;Bed level         General ADL Comments: Pt with spasms in B LE with movement and increased tone in B LE affecting functional level with ADLs. Pt also presenting with decreased activity tolerance and decreased sitting tolerance/balance.     Vision Baseline Vision/History: 0 No visual deficits Ability to See in Adequate Light: 0 Adequate Patient Visual Report: No change from baseline       Perception         Praxis         Pertinent Vitals/Pain Pain Assessment Pain Assessment: Faces Faces Pain Scale: Hurts whole lot Pain Location: LE, LB Pain Descriptors / Indicators: Spasm Pain Intervention(s): Limited activity within patient's tolerance, Monitored during session, Premedicated before session, Repositioned     Extremity/Trunk Assessment Upper Extremity Assessment Upper Extremity Assessment: Right hand dominant;Generalized weakness   Lower Extremity Assessment Lower Extremity Assessment: Defer to PT evaluation   Cervical / Trunk Assessment Cervical / Trunk Assessment: Neck Surgery   Communication Communication Communication: No apparent difficulties Cueing Techniques: Verbal cues;Gestural cues;Tactile cues (to adhere to cervical precautions)   Cognition Arousal: Alert Behavior During Therapy: WFL for tasks  assessed/performed Overall Cognitive Status: Within Functional Limits for tasks assessed                                 General Comments: AAOx4 and pleasant throughout session. Able to state cervical precautions Independently but requiring cues to adhere to precautions throughout session. Able to follow 2 and 3-step instructions consistently.     General Comments  Pt and wife educated in use of hard collar. Spouse present throughout session. Pt's HR in the 100s to low 120s with O2 sat >/95% on RA throughout session.    Exercises     Shoulder Instructions      Home Living Family/patient expects to be discharged to:: Inpatient rehab Living Arrangements: Spouse/significant other Available Help at Discharge: Family;Available 24 hours/day Type of Home: House Home Access: Stairs to enter [employer] of Steps: 1 Entrance Stairs-Rails: None Home Layout: Two level;Bed/bath upstairs;1/2 bath on main level Alternate Level Stairs-Number of Steps: flight   Bathroom Shower/Tub: Chief Strategy Officer: Standard     Home Equipment: Shower seat;Hand held Programmer, systems (2 wheels);Cane - single point;Wheelchair - manual;Crutches;Toilet riser   Additional Comments: reports started using w/c in June 2024 - he uses the wheelchair downstairs for more mobility, goes downstairs 1x/day to smoke a cigar (but hasn't been downstairs in ~63month PTA), uses RW upstairs for very short distance gait to/from bathroom for BM otherwise uses urinal for bladder -- S.O. 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 Pt had  been getting in and out of tub but has had a fall there. Per CIR notes just prior to this admission, pt completed ADLs with Min to Total assist.        OT Problem List: Decreased strength;Impaired balance (sitting and/or standing);Pain;Impaired tone;Decreased activity tolerance;Decreased coordination;Decreased knowledge of use of DME or AE      OT Treatment/Interventions: Self-care/ADL training;Therapeutic exercise;DME and/or AE instruction;Therapeutic activities;Patient/family education;Balance training    OT Goals(Current goals can be found in the care plan section) Acute Rehab OT Goals Patient Stated Goal: decreased pain, decreased spams, and increased independence OT Goal Formulation: With patient/family Time For Goal Achievement: 06/28/23 Potential to Achieve Goals: Fair ADL Goals Pt Will Perform Grooming: with contact guard assist;sitting (sitting EOB for 5 or more minutes demonstrating Fair balance; adhering to cervical precautions; with use of adaptive equipment as needed) Pt Will Perform Upper Body Bathing: sitting;with min assist (adhering to cervical precautions; with use of adaptive equipment as needed) Pt Will Perform Upper Body Dressing: with min assist;sitting (adhering to cervical precautions) Additional ADL Goal #1: Patient will perform bed mobility during/in preparation for functional tasks with Mod assist while adhering to cervical precautions.  OT Frequency: Min 1X/week    Co-evaluation PT/OT/SLP Co-Evaluation/Treatment: Yes Reason for Co-Treatment: Complexity of the patient's impairments (multi-system involvement);For patient/therapist safety;To address functional/ADL transfers PT goals addressed during session: Mobility/safety with mobility;Balance OT goals addressed during session: ADL's and self-care      AM-PAC OT "6 Clicks" Daily Activity     Outcome Measure Help from another person eating meals?: None Help from another person taking care of personal  grooming?: A Little Help from another person toileting, which includes using toliet, bedpan, or urinal?: Total Help from another person bathing (including washing, rinsing, drying)?: A Lot Help from another person to put on and taking off regular upper body clothing?: A Lot Help from another person to put on and taking off regular lower body clothing?: Total 6 Click Score: 13   End of Session Equipment Utilized During Treatment: Cervical collar Nurse Communication: Mobility status;Other (comment) (Pt premedicated before session.)  Activity Tolerance: Patient tolerated treatment well;Patient limited by pain Patient left: in bed;with call bell/phone within reach;with family/visitor present  OT Visit Diagnosis: Other abnormalities of gait and mobility (R26.89);History of falling (Z91.81);Muscle weakness (generalized) (M62.81);Repeated falls (R29.6);Pain                Time: 0917-1001 OT Time Calculation (min): 44 min Charges:  OT General Charges $OT Visit: 1 Visit OT Evaluation $OT Eval Low Complexity: 1 Low  Leon Degraffenreid "Kyle" M., OTR/L, MA Acute Rehab [phone]   Lendon Colonel 06/14/2023, 12:54 PM

## 2023-06-14 NOTE — Evaluation (Signed)
Physical Therapy Evaluation Patient Details Name: Leon Rodriguez MRN: [id number] DOB: 1966-04-13 Today's Date: 06/14/2023  History of Present Illness  57 yo male s/p C6-T1 ACDF on 12/11 for severe cervical stenosis with cord compression consistent with myelopathy. PMH includes OA, hiatal hernia repair.  Clinical Impression  Pt is presenting below baseline level of functioning. Currently pt is Max A +2 for bed mobility and has significant spasms in the LB and bil LE with movement. Pressure and LB flexion/R side bending improve pain and spasms in the LB and bil LE. Pt has a very supportive spouse. Due to pt current functional status, home set up and available assistance at home recommending skilled physical therapy services > 3 hours/day in order to address strength, balance and functional mobility to decrease risk for falls, injury, immobility, skin break down and re-hospitalization.          If plan is discharge home, recommend the following: A lot of help with walking and/or transfers;Assist for transportation;Help with stairs or ramp for entrance;Assistance with cooking/housework     Equipment Recommendations Other (comment);Wheelchair cushion (measurements PT);Wheelchair (measurements PT);Hospital bed (defer to post acute)  Recommendations for Other Services  Rehab consult    Functional Status Assessment Patient has had a recent decline in their functional status and demonstrates the ability to make significant improvements in function in a reasonable and predictable amount of time.     Precautions / Restrictions Precautions Precautions: Fall;Cervical Precaution Comments: hx of many falls, severe B LE hypertonia with muscle spasmsm, verbally went through cervical precautions throughout session Required Braces or Orthoses: Cervical Brace Cervical Brace: Hard collar (out of bed) Restrictions Weight Bearing Restrictions Per Provider Order: No      Mobility  Bed Mobility Overal bed  mobility: Needs Assistance Bed Mobility: Supine to Sit, Sit to Supine   Sidelying to sit: +2 for safety/equipment, Max assist, +2 for physical assistance Supine to sit: Max assist, +2 for physical assistance, +2 for safety/equipment     General bed mobility comments: HOB up ~80 degrees pt requires assistance with LE and trunk. Pt is able to get trunk to upright position but tries to pull requiring re-direction to precautions.    Transfers     General transfer comment: unable at this time.    Ambulation/Gait   General Gait Details: unable at this time.      Balance Overall balance assessment: Needs assistance, History of Falls Sitting-balance support: No upper extremity supported, Feet supported Sitting balance-Leahy Scale: Zero Sitting balance - Comments: requires Min A to total A for sitting upright on EOB with pillow support. Pt able to sit up for ~15 min on EOB intermittently leaning back on pillowsfor rest.         Pertinent Vitals/Pain Pain Assessment Pain Assessment: Faces Faces Pain Scale: Hurts whole lot Pain Location: LE, LB Pain Descriptors / Indicators: Spasm Pain Intervention(s): Monitored during session, Limited activity within patient's tolerance, Premedicated before session    Home Living Family/patient expects to be discharged to:: Inpatient rehab Living Arrangements: Spouse/significant other Available Help at Discharge: Family;Available 24 hours/day Type of Home: House Home Access: Stairs to enter Entrance Stairs-Rails: None Entrance Stairs-Number of Steps: 1   Home Layout: Two level;Bed/bath upstairs;1/2 bath on main level Home Equipment: Shower seat;Hand held Programmer, systems (2 wheels);Cane - single point;Wheelchair - manual;Crutches;Toilet riser Additional Comments: reports started using w/c in June 2024 - he uses the wheelchair downstairs for more mobility, goes downstairs 1x/day to smoke a cigar (but  hasn't been downstairs in ~23month  PTA), uses RW upstairs for very short distance gait to/from bathroom for BM otherwise uses urinal for bladder -- S.O. 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 Spasms in LB and bil LE throughout session with movement. Improve with WB and added pressure in sitting.)    Cervical / Trunk Assessment Cervical / Trunk Assessment: Neck Surgery  Communication   Communication Communication: No apparent difficulties Cueing Techniques: Verbal cues;Tactile cues  Cognition Arousal: Alert Behavior During Therapy: WFL for tasks assessed/performed Overall Cognitive Status: Within Functional Limits for tasks assessed       General Comments: able to state precautions but difficult adhereing to them.        General Comments General comments (skin integrity, edema, etc.): spouse present throughout session. Pt educated on use of hard collar        Assessment/Plan    PT Assessment Patient needs continued PT services  PT Problem List Decreased strength;Decreased balance;Decreased activity tolerance;Decreased coordination;Decreased range of motion;Pain;Cardiopulmonary status limiting activity;Decreased safety awareness;Decreased knowledge of use of DME;Decreased knowledge of precautions;Decreased mobility       PT Treatment Interventions DME instruction;Therapeutic activities;Gait training;Therapeutic exercise;Patient/family education;Balance training;Stair training;Functional mobility training;Neuromuscular re-education     PT Goals (Current goals can be found in the Care Plan section)  Acute Rehab PT Goals Patient Stated Goal: Return to rehab PT Goal Formulation: With patient/family Time For Goal Achievement: 06/28/23 Potential to Achieve Goals: Fair Additional Goals Additional Goal #1: Pt will be able to tolerate sitting up for 20 min while performing activities without rest breaks Additional Goal #2: Pt will be able to tolerate upright position in chair for 2 hours for cardiopulmonary health.    Frequency Min 1X/week     Co-evaluation PT/OT/SLP Co-Evaluation/Treatment: Yes Reason for Co-Treatment: Complexity of the patient's impairments (multi-system involvement);For patient/therapist safety;To address functional/ADL transfers PT goals addressed during session: Mobility/safety with mobility;Balance         AM-PAC PT "6 Clicks" Mobility  Outcome Measure Help needed turning from your back to your side while in a flat bed without using bedrails?: Total Help needed moving from lying on your back to sitting on the side of a flat bed without using bedrails?: Total Help needed moving to and from a bed to a chair (including a wheelchair)?: Total Help needed standing up from a chair using your arms (e.g., wheelchair or bedside chair)?: Total Help needed to walk in hospital room?: Total Help needed climbing 3-5 steps with a railing? : Total 6 Click Score: 6    End of Session Equipment Utilized During Treatment: Cervical collar Activity Tolerance: Patient limited by pain Patient left: in bed;with call bell/phone within reach;with family/visitor present Nurse Communication: Mobility status PT Visit Diagnosis: Other abnormalities of gait and mobility (R26.89);Muscle weakness (generalized) (M62.81)    Time: 6213-0865 PT Time Calculation (min) (ACUTE ONLY): 44 min   Charges:   PT Evaluation $PT Eval Low Complexity: 1 Low  PT Treatments $Therapeutic Activity: 8-22 mins PT General Charges $$ ACUTE  PT VISIT: 1 Visit        Harrel Carina, DPT, CLT  Acute Rehabilitation Services Office: [phone] (Secure chat preferred)   Claudia Desanctis 06/14/2023, 11:49 AM

## 2023-06-14 NOTE — Plan of Care (Signed)
Patient transferred to acute 

## 2023-06-14 NOTE — Progress Notes (Signed)
HD#0 SUBJECTIVE:  Patient Summary: Leon Rodriguez is a 57 y.o. male with a pertinent PMH of cervical spinal stenosis with compression s/p anterior cervical discectomy and fusion C6-T1 on 12/11 c/b with new neurogenic bladder and LE spasticity transitioned to CIR service on 12/13. IMTS was initially involved in care of patient as consultant to star sepsis evaluation, now being transferred to our service for hemodynamic monitoring, symptomatic management, and sepsis rule out .  Overnight Events: HR into 140s, febrile to 102. IMTS admitted.   Interim History: He reports continued lower back pain, which is sharp and stabbing in the middle of his back. Also continues to report numbness in the suprapubic region extending down his legs. Otherwise, he states he feels okay.   OBJECTIVE:  Vital Signs: Vitals:   06/14/23 0800 06/14/23 0815 06/14/23 0900 06/14/23 1000  BP: 102/78 103/75 103/72 121/73  Pulse: (!) 104 (!) 112 (!) 111   Resp: 17 16 17 12   Temp:  98 F (36.7 C)  98 F (36.7 C)  TempSrc:  Oral    SpO2: 98% 99% 97%   Weight:      Height:       Supplemental O2: Room Air SpO2: 97 %  Filed Weights   06/14/23 0300  Weight: 100.6 kg    Intake/Output Summary (Last 24 hours) at 06/14/2023 1037 Last data filed at 06/14/2023 0405 Gross per 24 hour  Intake 120 ml  Output 900 ml  Net -780 ml   Net IO Since Admission: -780 mL [06/14/23 1037]  Physical Exam: General: sitting in bed, uncomfortable but in no acute distress Cardiac: tachycardic, regular rhythm Pulmonary: normal rate and respiratory effort, CTA bilaterally Skin: surgical site clean, no erythema or tenderness or drainage Neuro: alert and oriented x 3, no CN deficits. Upper extremity strength in tact. Able to move ankle on right, but leg movement limited by back pain; very slight ability to wiggle toes on left. Sensation in tact in lower extremities, but dulled. Numbness present in suprapubic/pelvic region.   Patient  Lines/Drains/Airways Status     Active Line/Drains/Airways     Name Placement date Placement time Site Days   Peripheral IV 06/13/23 20 G 1" Anterior;Right Forearm 06/13/23  1013  Forearm  1   Urethral Catheter Shearon Stalls, RN Coude 16 Fr. 06/14/23  0018  Coude  less than 1   Incision (Closed) 06/05/23 Neck 06/05/23  1450  -- 9            Pertinent Labs:    Latest Ref Rng & Units 06/14/2023    3:41 AM 06/13/2023   11:32 PM 06/12/2023    9:28 PM  CBC  WBC 4.0 - 10.5 K/uL 6.3  6.5  8.0   Hemoglobin 13.0 - 17.0 g/dL 40.9  81.1  91.4   Hematocrit 39.0 - 52.0 % 28.1  30.4  31.7   Platelets 150 - 400 K/uL 258  273  292        Latest Ref Rng & Units 06/14/2023    3:41 AM 06/13/2023   11:32 PM 06/12/2023    9:28 PM  CMP  Glucose 70 - 99 mg/dL 782   956   BUN 6 - 20 mg/dL 8   10   Creatinine 2.13 - 1.24 mg/dL 0.86  5.78  4.69   Sodium 135 - 145 mmol/L 136   137   Potassium 3.5 - 5.1 mmol/L 3.9   3.8   Chloride 98 - 111 mmol/L 107  102   CO2 22 - 32 mmol/L 22   26   Calcium 8.9 - 10.3 mg/dL 8.7   9.5   Total Protein 6.5 - 8.1 g/dL   6.0   Total Bilirubin <1.2 mg/dL   0.9   Alkaline Phos 38 - 126 U/L   106   AST 15 - 41 U/L   58   ALT 0 - 44 U/L   49    Urinalysis: pyuria, bacteriuria Urine culture: + klebsiella  ASSESSMENT/PLAN:  Assessment: Principal Problem:   Sepsis (HCC) Active Problems:   Cervical myelopathy (HCC)   Neurogenic bladder   Lumbar canal stenosis   Lower extremity weakness   Muscle spasticity  Plan: Urinary tract infection, klebsiella aerogenes Urinalysis with pyuria, WBC clumps, and bacteriuria. Culture resulted with >100,000 colonies klebsiella aerogenes. Susceptibilities pending. No growth on blood cultures so far.  - Continue cefepime; discontinued vancomycin - F/u blood cultures - Tylenol PRN for fever  Neurogenic bladder Urinary retention Constipation Bladder scan demonstrated around ~400cc urine retention post-void overnight.  Coude catheter was inserted. Will continue to monitor.  - Continue tamsulosin 0.4 mg daily - Coude catheter inserted - Strict I's & O's - Continue senna twice BID, miralax, fleet enema prn for constipation  Cervical myelopathy s/p anterior cervical discectomy and fusion (C6-T1)  Lumbar back pain Functional deconditioning Previous MRI of the lower back showed multilevel lumbar spondylosis, foraminal stenosis, and a congenitally narrowed spine. May be worsened muscle spasms secondary to his recent surgery and post-surgical course. For now, we will continue to manage symptomatically and monitor for improvement. - neurosurgery following; appreciate assistance - pain control: tylenol 1000 mg q6h for mild pain; morphine 15 mg BID. Discontinued hydrocodone-acetaminophen for now, as he hasn't needed it.  - Baclofen 20 mg q6h, tizanidine 5 mg q6h prn for muscle spasms - Valium 5 mg q6h  - CT myelogram deferred until resolution of infection - Continue PT/OT  Best Practice: Diet: Heart Healthy VTE: enoxaparin (LOVENOX) injection 40 mg Start: 06/14/23 1000 Code: Full AB: Ceftriaxone Therapy Recs: Pending Family Contact: wife, at bedside. DISPO: Anticipated discharge pending medical workup and treatment.  Signature: Annett Fabian, MD  Internal Medicine Resident, PGY-1 [facility]  Pager: [phone] 10:37 AM, 06/14/2023   Please contact the on call pager after 5 pm and on weekends at [phone].

## 2023-06-14 NOTE — Progress Notes (Signed)
   Inpatient Rehabilitation Admissions Coordinator   Noted patient readmitted to acute hospital from CIR due to medical issues. I will place rehab consult to assess ability to readmit to CIR when medically ready.  Ottie Glazier, RN, MSN Rehab Admissions Coordinator [phone] 06/14/2023 12:33 PM

## 2023-06-14 NOTE — Progress Notes (Signed)
Physical Therapy Discharge Note  This patient was unable to complete the inpatient rehab program due to possible sepsis and transfer to acute; therefore did not meet their long term goals. Pt left the program at a maxA to total A assist level for their functional mobility/ transfers. This patient is being discharged from PT services at this time.  Pt's perception of pain in the last five days was unable to answer at this time.    See CareTool for functional status details  If the patient is able to return to inpatient rehabilitation within 3 midnights, this may be considered an interrupted stay and therapy services will resume as ordered. Modification and reinstatement of their goals will be made upon completion of therapy service reevaluations.

## 2023-06-14 NOTE — Progress Notes (Addendum)
Pt transferred from rehab, alert and oriented HR 110, yellow mews, rapid and MD aware. Pt denies pain unable to move left leg, right leg limited movement. Has aspen collar on, call bell within reach significant other at the bedside.

## 2023-06-14 NOTE — Progress Notes (Signed)
PHARMACY - PHYSICIAN COMMUNICATION CRITICAL VALUE ALERT - BLOOD CULTURE IDENTIFICATION (BCID)  Leon Rodriguez is an 57 y.o. male who presented to [facility] on [facility]/13/2023 with a chief complaint of fever  Assessment: 63 YOM with recent ACDF 12/11 transferred inpatient from CIR with concern for urosepsis. Now with 1 of 4 blood cultures from 12/18 growing GNR with BCID detecting Klebsiella aerogenes.   Name of physician (or Provider) Contacted: IMTS team  Current antibiotics: Cefepime + Vancomycin  Changes to prescribed antibiotics recommended:  D/c Vanc, continue Cefepime monotherapy for now  Results for orders placed or performed during the hospital encounter of 06/07/23  Blood Culture ID Panel (Reflexed) (Collected: 06/12/2023  9:30 PM)  Result Value Ref Range   Enterococcus faecalis NOT DETECTED NOT DETECTED   Enterococcus Faecium NOT DETECTED NOT DETECTED   Listeria monocytogenes NOT DETECTED NOT DETECTED   Staphylococcus species NOT DETECTED NOT DETECTED   Staphylococcus aureus (BCID) NOT DETECTED NOT DETECTED   Staphylococcus epidermidis NOT DETECTED NOT DETECTED   Staphylococcus lugdunensis NOT DETECTED NOT DETECTED   Streptococcus species NOT DETECTED NOT DETECTED   Streptococcus agalactiae NOT DETECTED NOT DETECTED   Streptococcus pneumoniae NOT DETECTED NOT DETECTED   Streptococcus pyogenes NOT DETECTED NOT DETECTED   A.calcoaceticus-baumannii NOT DETECTED NOT DETECTED   Bacteroides fragilis NOT DETECTED NOT DETECTED   Enterobacterales DETECTED (A) NOT DETECTED   Enterobacter cloacae complex NOT DETECTED NOT DETECTED   Escherichia coli NOT DETECTED NOT DETECTED   Klebsiella aerogenes DETECTED (A) NOT DETECTED   Klebsiella oxytoca NOT DETECTED NOT DETECTED   Klebsiella pneumoniae NOT DETECTED NOT DETECTED   Proteus species NOT DETECTED NOT DETECTED   Salmonella species NOT DETECTED NOT DETECTED   Serratia marcescens NOT DETECTED NOT DETECTED   Haemophilus influenzae  NOT DETECTED NOT DETECTED   Neisseria meningitidis NOT DETECTED NOT DETECTED   Pseudomonas aeruginosa NOT DETECTED NOT DETECTED   Stenotrophomonas maltophilia NOT DETECTED NOT DETECTED   Candida albicans NOT DETECTED NOT DETECTED   Candida auris NOT DETECTED NOT DETECTED   Candida glabrata NOT DETECTED NOT DETECTED   Candida krusei NOT DETECTED NOT DETECTED   Candida parapsilosis NOT DETECTED NOT DETECTED   Candida tropicalis NOT DETECTED NOT DETECTED   Cryptococcus neoformans/gattii NOT DETECTED NOT DETECTED   CTX-M ESBL NOT DETECTED NOT DETECTED   Carbapenem resistance IMP NOT DETECTED NOT DETECTED   Carbapenem resistance KPC NOT DETECTED NOT DETECTED   Carbapenem resistance NDM NOT DETECTED NOT DETECTED   Carbapenem resist OXA 48 LIKE NOT DETECTED NOT DETECTED   Carbapenem resistance VIM NOT DETECTED NOT DETECTED     Thank you for allowing pharmacy to be a part of this patient's care.  Georgina Pillion, PharmD, BCPS, BCIDP Infectious Diseases Clinical Pharmacist 06/14/2023 10:33 AM   **Pharmacist phone directory can now be found on [email or web address] ([email or web address]).  Listed under [facility].

## 2023-06-14 NOTE — Plan of Care (Signed)
  Request to re-attempt cervical myelogram.  On 06/12/23, Lumbar puncture was attempted at the L2- L3 level with no CSF return.   Additional lumbar puncture was attempted at the level of L1-L2, unsuccessful.   MR L-spine from 04/28/2023 reviewed by Dr. Lowella Dandy. It appears that patient does not have much CSF in the lumbar spine.  MR L -spine images were also reviewed by Dr. Sebastian Ache. 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 He is not optimistic that another attempt would be successful.  Dr. Berline Chough has been informed via secure chat and she will inform Dr. Jake Samples.  Jerry Caras Katarzyna Wolven PA-C 06/14/2023 2:56 PM

## 2023-06-14 NOTE — Progress Notes (Signed)
Occupational Therapy Discharge Note  This patient was unable to complete the inpatient rehab program due to medical issues; therefore did not meet their long term goals. Pt left the program at a max to total A assist level for their  functional ADLs. This patient is being discharged from OT services at this time.  BIMS at time of d/c  Pt unable to complete due to medical status  See CareTool for functional status details.  If the patient is able to return to inpatient rehabilitation within 3 midnights, this may be considered an interrupted stay and therapy services will resume as ordered. Modification and reinstatement of their goals will be made upon completion of therapy service reevaluations.

## 2023-06-14 NOTE — Progress Notes (Signed)
Inpatient Rehabilitation Care Coordinator Discharge Note   Patient Details  Name: Leon Rodriguez MRN: [id number] Date of Birth: 06/12/1966   Discharge location: D/c to acute due to medical concerns  Length of Stay: 6 days  Discharge activity level:    Home/community participation:    Patient response WU:[id number] Literacy - How often do you need to have someone help you when you read instructions, pamphlets, or other written material from your doctor or pharmacy?: Never  Patient response YN:[id number] Isolation - How often do you feel lonely or isolated from those around you?: Patient unable to respond  Services provided included: MD, RD, PT, OT, RN, TR, Pharmacy, Neuropsych, SW, CM  Financial Services:  Field seismologist Utilized: Media planner [place] [employer]  Choices offered to/list presented to: N/A  Follow-up services arranged:              Patient response to transportation need: Is the patient able to respond to transportation needs?: No In the past 12 months, has lack of transportation kept you from medical appointments or from getting medications?: No In the past 12 months, has lack of transportation kept you from meetings, work, or from getting things needed for daily living?: No   Patient/Family verbalized understanding of follow-up arrangements:  Yes  Individual responsible for coordination of the follow-up plan: contact pt wife [name] [phone]  Confirmed correct DME delivered: Gretchen Short 06/14/2023    Comments (or additional information):  Summary of Stay    Date/Time Discharge Planning CSW  06/10/23 1039 D/c to home with s/o [name]. She is the only caregiver. Reports her dtr can hekp PRN some evenings. Pt needs to be Mod I/intermittent supervision at discharge as she intends to return to work after Jan 15. SW will confirm there are no barriers to discharge. AAC       Raliegh Scobie A Lula Olszewski

## 2023-06-14 NOTE — Progress Notes (Signed)
Pt voided 300 cc urine post void bladder scan greater than 380. Dr Lily Kocher paged and ordered indwelling coude cath placed. Rehab charge nurse placed coude cath, pt tolerated well. When I went in patent had his aspen collar off. I replaced and explained importance of having collar on.

## 2023-06-14 NOTE — Progress Notes (Signed)
Inpatient Rehab Admissions Coordinator:    Cir following. Pt. Is currently not yet medically stable or tolerating therapy well enough to consider return to CIR, but I'm following for potential CIR admit once medically stable. I will also need to have a discussion with Pt.'s wife regarding support at d/c. She had previously planned to return to work in January, but It is possible Pt. Will need support for longer that that.   Megan Salon, MS, CCC-SLP Rehab Admissions Coordinator  [phone] (celll) [phone] (office)

## 2023-06-15 DIAGNOSIS — N319 Neuromuscular dysfunction of bladder, unspecified: Secondary | ICD-10-CM

## 2023-06-15 LAB — URINE CULTURE: Culture: >=100000 — AB

## 2023-06-15 LAB — CBC
HCT: 28.5 % — ABNORMAL LOW (ref 39.0–52.0)
Hemoglobin: 10 g/dL — ABNORMAL LOW (ref 13.0–17.0)
MCH: 33.8 pg (ref 26.0–34.0)
MCHC: 35.1 g/dL (ref 30.0–36.0)
MCV: 96.3 fL (ref 80.0–100.0)
Platelets: 288 10*3/uL (ref 150–400)
RBC: 2.96 MIL/uL — ABNORMAL LOW (ref 4.22–5.81)
RDW: 12.7 % (ref 11.5–15.5)
WBC: 6.5 10*3/uL (ref 4.0–10.5)
nRBC: 0 % (ref 0.0–0.2)

## 2023-06-15 MED ORDER — LEVOFLOXACIN 750 MG PO TABS
750.0000 mg | ORAL_TABLET | Freq: Every day | ORAL | Status: DC
  Administered 2023-06-15 – 2023-06-17 (×3): 750 mg via ORAL
  Filled 2023-06-15 (×4): qty 1

## 2023-06-15 MED ORDER — LIDOCAINE 5 % EX PTCH
1.0000 | MEDICATED_PATCH | CUTANEOUS | Status: DC
  Administered 2023-06-15 – 2023-06-18 (×4): 1 via TRANSDERMAL
  Filled 2023-06-15 (×4): qty 1

## 2023-06-15 MED ORDER — CHLORHEXIDINE GLUCONATE CLOTH 2 % EX PADS
6.0000 | MEDICATED_PAD | Freq: Every day | CUTANEOUS | Status: DC
  Administered 2023-06-15 – 2023-06-18 (×4): 6 via TOPICAL

## 2023-06-15 MED ORDER — ACETAMINOPHEN 325 MG PO TABS
650.0000 mg | ORAL_TABLET | Freq: Four times a day (QID) | ORAL | Status: DC
  Administered 2023-06-15 – 2023-06-18 (×13): 650 mg via ORAL
  Filled 2023-06-15 (×13): qty 2

## 2023-06-15 MED ORDER — HYDROCODONE-ACETAMINOPHEN 5-325 MG PO TABS
2.0000 | ORAL_TABLET | ORAL | Status: DC | PRN
  Administered 2023-06-16: 2 via ORAL
  Filled 2023-06-15: qty 2

## 2023-06-15 NOTE — Progress Notes (Signed)
Pharmacy Antibiotic Note  Leon Rodriguez is a 57 y.o. male admitted on 06/13/2023 with UTI.  Has been on cefepime. Ucx with klebsiella aerogenes sensitive to ciprofloxacin. Bcx also with klebsiella aerogenes. Pharmacy has been consulted for levofloxacin dosing.  Estimated CrCl 117 mL/min with stable renal function. No leukocytosis, afebrile  Plan: Stop cefepime Start levofloxacin 750mg  PO daily F/u renal function, clinical progression, and LOT  Height: 5\' 9"  (175.3 cm) Weight: 100.6 kg (221 lb 12.5 oz) IBW/kg (Calculated) : 70.7  Temp (24hrs), Avg:98.1 F (36.7 C), Min:97.7 F (36.5 C), Max:99 F (37.2 C)  Recent Labs  Lab 06/10/23 0520 06/12/23 2128 06/12/23 2223 06/13/23 2332 06/14/23 0341 06/15/23 0249  WBC 7.6 8.0  --  6.5 6.3 6.5  CREATININE 0.56* 0.80  --  0.78 0.69  --   LATICACIDVEN  --  2.4* 2.0*  --   --   --     Estimated Creatinine Clearance: 117.7 mL/min (by C-G formula based on SCr of 0.69 mg/dL).    No Known Allergies  Antimicrobials this admission: Levofloxacin 12/21 >>  Cefepime 12/19 >> 12/21 Vancomycin 12/19 x1  Microbiology results: 12/19 Ucx: kleb aerogenes (S-cipro, bactrim, cefepime) 12/19 Bcx: ng < 24hrs 12/18 Bcx: kleb aerogenes 12/18 BCID: kleb aerogenes  Thank you for allowing pharmacy to be a part of this patient's care.  Jenita Seashore 06/15/2023 3:15 PM

## 2023-06-15 NOTE — Progress Notes (Addendum)
HD#1 SUBJECTIVE:  Patient Summary: Leon Rodriguez is a 57 y.o. male with a pertinent PMH of cervical spinal stenosis with compression s/p anterior cervical discectomy and fusion C6-T1 on 12/11 c/b with new neurogenic bladder and LE spasticity transitioned to CIR service on 12/13. IMTS was initially involved in care of patient as consultant to star sepsis evaluation, now being transferred to our service for hemodynamic monitoring, symptomatic management, and sepsis rule out.  Overnight Events: None  Interim History: He feels he is doing better today. Feels like he is slowly regaining a little function in his legs. Still has persistent back pain and some abdominal spasms. Denies any fevers, chills, other new symptoms.   OBJECTIVE:  Vital Signs: Vitals:   06/15/23 0012 06/15/23 0256 06/15/23 1002 06/15/23 1200  BP: 118/67 95/67 107/68 (!) 116/97  Pulse: (!) 101 82 (!) 106 (!) 101  Resp: 19 16 15 14   Temp: 99 F (37.2 C) 98.6 F (37 C) 97.9 F (36.6 C) 97.9 F (36.6 C)  TempSrc: Oral Oral Oral Oral  SpO2: 97% 100% 99% 94%  Weight:      Height:       Supplemental O2: Room Air SpO2: 94 %  Filed Weights   06/14/23 0300  Weight: 100.6 kg    Intake/Output Summary (Last 24 hours) at 06/15/2023 1439 Last data filed at 06/15/2023 0981 Gross per 24 hour  Intake 200 ml  Output 2600 ml  Net -2400 ml   Net IO Since Admission: -4,780 mL [06/15/23 1439]  Physical Exam: General: sitting in bed, wearing a neck brace, in no acute distress Cardiac: tachycardic, regular rhythm, no murmurs appreciated Pulmonary: normal rate and respiratory effort, CTA bilaterally Neuro: alert and oriented x 3. No new focal deficits. Right leg strength continues to improve. He is now able to lift his right leg against gravity. Left leg is mildly improved, still only able to wiggle his toes, but more than yesterday.   Patient Lines/Drains/Airways Status     Active Line/Drains/Airways     Name  Placement date Placement time Site Days   Peripheral IV 06/13/23 20 G 1" Anterior;Right Forearm 06/13/23  1013  Forearm  1   Urethral Catheter Shearon Stalls, RN Coude 16 Fr. 06/14/23  0018  Coude  less than 1   Incision (Closed) 06/05/23 Neck 06/05/23  1450  -- 9            Pertinent Labs:    Latest Ref Rng & Units 06/15/2023    2:49 AM 06/14/2023    3:41 AM 06/13/2023   11:32 PM  CBC  WBC 4.0 - 10.5 K/uL 6.5  6.3  6.5   Hemoglobin 13.0 - 17.0 g/dL 19.1  47.8  29.5   Hematocrit 39.0 - 52.0 % 28.5  28.1  30.4   Platelets 150 - 400 K/uL 288  258  273       Latest Ref Rng & Units 06/14/2023    3:41 AM 06/13/2023   11:32 PM 06/12/2023    9:28 PM  CMP  Glucose 70 - 99 mg/dL 621   308   BUN 6 - 20 mg/dL 8   10   Creatinine 6.57 - 1.24 mg/dL 8.46  9.62  9.52   Sodium 135 - 145 mmol/L 136   137   Potassium 3.5 - 5.1 mmol/L 3.9   3.8   Chloride 98 - 111 mmol/L 107   102   CO2 22 - 32 mmol/L 22   26  Calcium 8.9 - 10.3 mg/dL 8.7   9.5   Total Protein 6.5 - 8.1 g/dL   6.0   Total Bilirubin <1.2 mg/dL   0.9   Alkaline Phos 38 - 126 U/L   106   AST 15 - 41 U/L   58   ALT 0 - 44 U/L   49     Blood cultures from 12/18: klebsiella aerogenes.  Repeat blood cultures from 12/19: no growth after 48 hours.   ASSESSMENT/PLAN:  Assessment: Principal Problem:   Neurogenic bladder Active Problems:   Cervical myelopathy (HCC)   Sepsis (HCC)   Lumbar canal stenosis   Lower extremity weakness   Muscle spasticity   Acute cystitis without hematuria  Plan: Klebsiella aerogenes bacteremia 2/2 Urinary tract infection Afebrile overnight, no new symptoms. WBC 6.5. On antibiotics. Blood cultures from 12/18 grew klebsiella aerogenes. Repeat blood cultures from 12/19 show no growth after 48 hours. Susceptibilities have resulted, so we will de-escalate his antibiotics. He is medically stable for admission to CIR to complete his course of oral antibiotics. - De-escalating antibiotics from  cefepime to levofloxacin - Tylenol PRN for fever  Cervical myelopathy s/p anterior cervical discectomy and fusion (C6-T1)  Lumbar back pain Functional deconditioning Still reporting lower back pain and spasms, which have moved to his left leg and abdomen. He feels his pain is about the same. 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 We will continue to manage his symptoms with the plan to return to CIR for more intensive therapy. - neurosurgery following; appreciate assistance - pain control: tylenol 650 mg scheduled q6h; morphine 15 mg BID scheduled. Restarted hydrocodone-acetaminophen prn for now given continued pain - Baclofen 20 mg q6h, tizanidine 5 mg q6h prn for muscle spasms - Valium 5 mg q6h  - Continue PT/OT  Neurogenic bladder Urinary retention Constipation Had a bowel movement since yesterday per his wife, but it was not a lot of stool. 4L of urine output yesterday. Catheter in place. Will continue to monitor.  - Continue tamsulosin 0.4 mg daily - Coude catheter inserted - Strict I's & O's - Continue senna twice BID, miralax, fleet enema prn for constipation  Best Practice: Diet: Heart Healthy VTE: enoxaparin (LOVENOX) injection 40 mg Start: 06/14/23 1000 Code: Full AB: Levofloxacin, dosed per pharmacy Therapy Recs: Pending Family Contact: wife, at bedside. DISPO: Anticipated discharge to CIR in 1-2 days pending placement.  Signature: Annett Fabian, MD  Internal Medicine Resident, PGY-1 [facility]  Pager: [phone] 2:39 PM, 06/15/2023   Please contact the on call pager after 5 pm and on weekends at [phone].

## 2023-06-15 NOTE — Plan of Care (Signed)
  Problem: Clinical Measurements: Goal: Ability to maintain clinical measurements within normal limits will improve Outcome: Progressing   Problem: Health Behavior/Discharge Planning: Goal: Ability to manage health-related needs will improve Outcome: Progressing   Problem: Clinical Measurements: Goal: Diagnostic test results will improve Outcome: Progressing   Problem: Clinical Measurements: Goal: Will remain free from infection Outcome: Progressing   Problem: Clinical Measurements: Goal: Respiratory complications will improve Outcome: Progressing   Problem: Nutrition: Goal: Adequate nutrition will be maintained Outcome: Progressing   Problem: Activity: Goal: Risk for activity intolerance will decrease Outcome: Progressing

## 2023-06-16 ENCOUNTER — Encounter (HOSPITAL_COMMUNITY): Payer: Self-pay | Admitting: Internal Medicine

## 2023-06-16 DIAGNOSIS — G959 Disease of spinal cord, unspecified: Secondary | ICD-10-CM

## 2023-06-16 DIAGNOSIS — R7881 Bacteremia: Secondary | ICD-10-CM | POA: Insufficient documentation

## 2023-06-16 LAB — CULTURE, BLOOD (ROUTINE X 2): Special Requests: ADEQUATE

## 2023-06-16 LAB — CBC
HCT: 30.3 % — ABNORMAL LOW (ref 39.0–52.0)
Hemoglobin: 10.6 g/dL — ABNORMAL LOW (ref 13.0–17.0)
MCH: 33.1 pg (ref 26.0–34.0)
MCHC: 35 g/dL (ref 30.0–36.0)
MCV: 94.7 fL (ref 80.0–100.0)
Platelets: 339 10*3/uL (ref 150–400)
RBC: 3.2 MIL/uL — ABNORMAL LOW (ref 4.22–5.81)
RDW: 12.2 % (ref 11.5–15.5)
WBC: 7.6 10*3/uL (ref 4.0–10.5)
nRBC: 0 % (ref 0.0–0.2)

## 2023-06-16 LAB — GLUCOSE, CAPILLARY: Glucose-Capillary: 117 mg/dL — ABNORMAL HIGH (ref 70–99)

## 2023-06-16 MED ORDER — OXYCODONE HCL 5 MG PO TABS
5.0000 mg | ORAL_TABLET | ORAL | Status: DC | PRN
  Administered 2023-06-17: 5 mg via ORAL
  Filled 2023-06-16: qty 1

## 2023-06-16 MED ORDER — POLYETHYLENE GLYCOL 3350 17 G PO PACK
17.0000 g | PACK | Freq: Every day | ORAL | Status: DC
  Administered 2023-06-16 – 2023-06-18 (×3): 17 g via ORAL
  Filled 2023-06-16 (×3): qty 1

## 2023-06-16 NOTE — Plan of Care (Signed)

## 2023-06-16 NOTE — Progress Notes (Signed)
   Providing Compassionate, Quality Care - Together  NEUROSURGERY PROGRESS NOTE   S: No issues overnight.  Improving with antibiotics  O: EXAM:  BP 114/70 (BP Location: Left Arm)   Pulse 96   Temp 97.6 F (36.4 C) (Oral)   Resp 14   Ht 5\' 9"  (1.753 m)   Wt 98.9 kg   SpO2 100%   BMI 32.20 kg/m   Awake, alert, oriented x 3 PERRL Speech fluent, appropriate  CNs grossly intact  Bilateral upper extremities 4-4+/5 throughout Right lower extremity 2-3/5 Left lower extremity 1-2/5 Incision clean dry and intact  ASSESSMENT:  57 y.o. 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  His neurologic status remains slightly improved now that his urinary tract infection is being treated therefore I think we can hold off on CT myelogram at this time.    Thank you for allowing me to participate in this patient's care.  Please do not hesitate to call with questions or concerns.   Monia Pouch, DO Neurosurgeon [facility] [phone]

## 2023-06-16 NOTE — Progress Notes (Signed)
                 Interval history No new concerns today.  Feeling about the same as yesterday.  Still with significant weakness in the left left leg.  Lots of ongoing lower back spasms.  Physical exam Blood pressure 114/77, pulse (!) 108, temperature 99.2 F (37.3 C), temperature source Oral, resp. rate 18, height 5\' 9"  (1.753 m), weight 98.9 kg, SpO2 97%.  No distress Heart rate is tachycardic, rhythm is regular, radial pulses strong Breathing comfortably on room air Abdomen is nontender Skin is warm and dry Alert and oriented, left lower extremity is weak with impaired flexion at hip and knee, 2-3 out of 5 for plantarflexion Urinary catheter in place draining clear urine  Assessment and plan Hospital day 2  Leon Rodriguez is a 57 y.o. with cervical myelopathy status post anterior cervical discectomy and fusion on 06/05/2023 admitted to our service from inpatient rehab for sepsis and treated for cystitis and bacteremia due to Klebsiella aerogenes.  Principal Problem:   Cervical myelopathy (HCC) Active Problems:   Neurogenic bladder   Lumbar canal stenosis   Lower extremity weakness   Acute cystitis without hematuria   Bacteremia  Bacteremia With Klebsiella aerogenes, presumably from cystitis.  Improving, no longer septic.  Will complete a 7-day course of antibiotics on 06/19/2023. - Continue levofloxacin 750 mg daily  Acute cystitis Onset prior to cath for neurogenic bladder.  Unknown whether he was catheterized for his discectomy on 06/05/2023.  Levofloxacin per above.  Cervical myelopathy Subacute, progressive over months.  Had good functional status but was wheelchair-bound with neurogenic bladder and urinary incontinence prior to hospitalization for discectomy and fusion couple of weeks ago.  Still with severe left leg weakness.  Neurosurgery following.  Anticipate he can go back to CIR for ongoing rehab tomorrow.  Neurogenic bladder Hopeful for removal of catheter  prior to discharge from the hospital.  Will need trial of void before that.  Lumbar back spasms Presumably from his deconditioning and bedbound status of late.  On a pretty heavy pain regimen, outlined below. - Tylenol 650 mg every 6 hours - Baclofen 20 mg every 6 hours - Diazepam 5 mg every 6 hours - MS Contin 15 mg twice daily - Oxycodone 5 mg every 4 hours as needed for breakthrough pain  Resolved Problems:   Sepsis (HCC)  Diet: Regular IVF: N/A VTE: enoxaparin (LOVENOX) injection 40 mg Start: 06/14/23 1000  Code: Full PT/OT recommendations: Inpatient rehab, >3 hours/day TOC recommendations: N/A Family Update: Wife at bedside  Discharge plan: Hopeful for discharge to CIR tomorrow.  Marrianne Mood MD 06/16/2023, 4:01 PM  Pager: [phone] After 5pm or weekend: [phone]

## 2023-06-16 NOTE — Plan of Care (Signed)
  Problem: Clinical Measurements: Goal: Will remain free from infection Outcome: Progressing   Problem: Clinical Measurements: Goal: Ability to maintain clinical measurements within normal limits will improve Outcome: Progressing Goal: Will remain free from infection Outcome: Progressing Goal: Diagnostic test results will improve Outcome: Progressing Goal: Respiratory complications will improve Outcome: Progressing Goal: Cardiovascular complication will be avoided Outcome: Progressing   Problem: Health Behavior/Discharge Planning: Goal: Ability to manage health-related needs will improve Outcome: Progressing   Problem: Clinical Measurements: Goal: Respiratory complications will improve Outcome: Progressing   Problem: Nutrition: Goal: Adequate nutrition will be maintained Outcome: Progressing   Problem: Activity: Goal: Risk for activity intolerance will decrease Outcome: Progressing

## 2023-06-17 DIAGNOSIS — R7881 Bacteremia: Secondary | ICD-10-CM

## 2023-06-17 LAB — BASIC METABOLIC PANEL
Anion gap: 7 (ref 5–15)
BUN: 8 mg/dL (ref 6–20)
CO2: 26 mmol/L (ref 22–32)
Calcium: 9.2 mg/dL (ref 8.9–10.3)
Chloride: 103 mmol/L (ref 98–111)
Creatinine, Ser: 0.74 mg/dL (ref 0.61–1.24)
GFR, Estimated: >60 mL/min (ref >60)
Glucose, Bld: 109 mg/dL — ABNORMAL HIGH (ref 70–99)
Potassium: 3.7 mmol/L (ref 3.5–5.1)
Sodium: 136 mmol/L (ref 135–145)

## 2023-06-17 LAB — CBC
HCT: 29.5 % — ABNORMAL LOW (ref 39.0–52.0)
Hemoglobin: 10.3 g/dL — ABNORMAL LOW (ref 13.0–17.0)
MCH: 33.6 pg (ref 26.0–34.0)
MCHC: 34.9 g/dL (ref 30.0–36.0)
MCV: 96.1 fL (ref 80.0–100.0)
Platelets: 346 10*3/uL (ref 150–400)
RBC: 3.07 MIL/uL — ABNORMAL LOW (ref 4.22–5.81)
RDW: 12.3 % (ref 11.5–15.5)
WBC: 7.6 10*3/uL (ref 4.0–10.5)
nRBC: 0 % (ref 0.0–0.2)

## 2023-06-17 LAB — CULTURE, BLOOD (ROUTINE X 2)
Culture: NO GROWTH
Special Requests: ADEQUATE

## 2023-06-17 MED ORDER — LACTATED RINGERS IV BOLUS
500.0000 mL | Freq: Once | INTRAVENOUS | Status: AC
  Administered 2023-06-17: 500 mL via INTRAVENOUS

## 2023-06-17 NOTE — Progress Notes (Signed)
Inpatient Rehab Admissions Coordinator:    CIR following. Pt. Improving, wants to return to CIR and wife is going to push back return to work until February. I will send case to insurance once OT note is in.   Megan Salon, MS, CCC-SLP Rehab Admissions Coordinator  [phone] (celll) [phone] (office)

## 2023-06-17 NOTE — Progress Notes (Signed)
Occupational Therapy Treatment Patient Details Name: Leon Rodriguez MRN: [id number] DOB: Apr 21, 1966 Today's Date: 06/17/2023   History of present illness Pt 57 yo male s/p C6-T1 ACDF on 12/11 for severe cervical stenosis with cord compression consistent with myelopathy. Pt discharged to CIR at [facility] on 12/13 and transfered back to acute care on 12/19 due to tachycardia, fever, and suspected sepsis. PMH includes OA, hiatal hernia repair, urinary retention and incontinence   OT comments  Pt tolerates therapy well, able to fully participate, and motivated to improve. Wife present during session, very helpful and able to assist with bed mobility/transfers. Maximove utilized for w/c to bed transfer. Pt bed level for toileting hygiene, min A to roll L/R. Pt assisted to EOB with max A for BLEs and power sitting up. Pt attempted to use Stedy several times standing from EOB, able to stand for 3-4 seconds each stand, clears about 1 foot off bed. Pt has good overall endurance for activities and was able to use Stedy earlier with PT. Pt would benefit from postacute intensive rehab >3hrs/day to quickly improve, will continue to follow acutely to progress as able. Pt tolerates therapy well, motivated to participate, and is good candidate for intensive rehab, has good support at home.      If plan is discharge home, recommend the following:  Two people to help with walking and/or transfers;Two people to help with bathing/dressing/bathroom;Assistance with cooking/housework;Assist for transportation;Help with stairs or ramp for entrance   Equipment Recommendations  Other (comment) (defer)    Recommendations for Other Services Rehab consult    Precautions / Restrictions Precautions Precautions: Fall;Cervical Precaution Booklet Issued: Yes (comment) Precaution Comments: hx of many falls, severe B LE hypertonia with muscle spasms, verbally went through cervical precautions throughout session Required Braces or  Orthoses: Cervical Brace Cervical Brace: Hard collar Restrictions Weight Bearing Restrictions Per Provider Order: No Other Position/Activity Restrictions: cervical precautions       Mobility Bed Mobility Overal bed mobility: Needs Assistance Bed Mobility: Rolling, Supine to Sit, Sit to Supine Rolling: Min assist, Used rails   Supine to sit: Max assist, +2 for physical assistance, +2 for safety/equipment, HOB elevated Sit to supine: Max assist, +2 for physical assistance, Used rails   General bed mobility comments: max A for sitting EOB and lying back down, not able to assist with BLEs. Rolls with min A L/R    Transfers Overall transfer level: Needs assistance Equipment used: Ambulation equipment used Transfers: Sit to/from Stand, Bed to chair/wheelchair/BSC Sit to Stand: Max assist, +2 physical assistance, +2 safety/equipment, From elevated surface, Via lift equipment           General transfer comment: maximove for w/c to bed, not able to stand up to use [place] today during session after several attempts. Transfer via Lift Equipment: Maximove   Balance Overall balance assessment: Needs assistance, History of Falls Sitting-balance support: Single extremity supported, Bilateral upper extremity supported, No upper extremity supported, Feet supported Sitting balance-Leahy Scale: Poor Sitting balance - Comments: poor sitting balance, difficulty placing BLEs in comfortable position to allow for proper sitting balance Postural control: Posterior lean Standing balance support: Bilateral upper extremity supported, During functional activity, Reliant on assistive device for balance Standing balance-Leahy Scale: Zero Standing balance comment: was not able to stand with Stedy, does not clear bed high enough to use Stedy consistently, not able to maintain for more than 3-4 seconds after repeated attempts.  ADL either performed or assessed with  clinical judgement   ADL Overall ADL's : Needs assistance/impaired Eating/Feeding: Modified independent;Bed level   Grooming: Minimal assistance;Bed level           Upper Body Dressing : Maximal assistance;Sitting   Lower Body Dressing: Total assistance;Bed level       Toileting- Clothing Manipulation and Hygiene: Total assistance;+2 for safety/equipment;+2 for physical assistance;Bed level         General ADL Comments: Pt bed level for most ADLs at this time, able to roll with min A for toileting hygiene in bed, L knee flexion spasms limit ability to turn and position in bed. Pt max A for UB dressing sitting EOB, difficulty maintaining balance while sitting to participate in ADLs    Extremity/Trunk Assessment              Vision       Perception     Praxis      Cognition Arousal: Alert Behavior During Therapy: WFL for tasks assessed/performed Overall Cognitive Status: Within Functional Limits for tasks assessed                                 General Comments: able to fully participate        Exercises      Shoulder Instructions       General Comments      Pertinent Vitals/ Pain       Pain Assessment Pain Assessment: Faces Faces Pain Scale: Hurts even more Pain Location: LE, LB Pain Descriptors / Indicators: Spasm Pain Intervention(s): Monitored during session  Home Living                                          Prior Functioning/Environment              Frequency  Min 1X/week        Progress Toward Goals  OT Goals(current goals can now be found in the care plan section)  Progress towards OT goals: Progressing toward goals  Acute Rehab OT Goals Patient Stated Goal: to improve BLE strength OT Goal Formulation: With patient/family Time For Goal Achievement: 06/28/23 Potential to Achieve Goals: Fair ADL Goals Pt Will Perform Grooming: with contact guard assist;sitting Pt Will Perform Upper  Body Bathing: sitting;with min assist Pt Will Perform Lower Body Bathing: with min assist;with adaptive equipment;sit to/from stand Pt Will Perform Upper Body Dressing: with min assist;sitting Pt Will Perform Lower Body Dressing: sit to/from stand;with min assist Pt Will Transfer to Toilet: with contact guard assist;ambulating;bedside commode Pt Will Perform Toileting - Clothing Manipulation and hygiene: with contact guard assist;sit to/from stand Additional ADL Goal #1: Patient will perform bed mobility during/in preparation for functional tasks with Mod assist while adhering to cervical precautions.  Plan      Co-evaluation                 AM-PAC OT "6 Clicks" Daily Activity     Outcome Measure   Help from another person eating meals?: None Help from another person taking care of personal grooming?: A Little Help from another person toileting, which includes using toliet, bedpan, or urinal?: Total Help from another person bathing (including washing, rinsing, drying)?: A Lot Help from another person to put on and taking off regular upper body clothing?:  A Lot Help from another person to put on and taking off regular lower body clothing?: Total 6 Click Score: 13    End of Session Equipment Utilized During Treatment: Gait belt;Other (comment);Cervical collar (Stedy, maximove)  OT Visit Diagnosis: Other abnormalities of gait and mobility (R26.89);History of falling (Z91.81);Muscle weakness (generalized) (M62.81);Repeated falls (R29.6);Pain Pain - part of body: Leg   Activity Tolerance Patient tolerated treatment well   Patient Left in bed;with call bell/phone within reach;with nursing/sitter in room;with family/visitor present   Nurse Communication Mobility status        Time: 1610-9604 OT Time Calculation (min): 36 min  Charges: OT General Charges $OT Visit: 1 Visit OT Treatments $Self Care/Home Management : 23-37 mins  [address], OTR/L   Alexis Goodell 06/17/2023, 2:29 PM

## 2023-06-17 NOTE — Progress Notes (Signed)
                  Subjective:   Summary: Patient is a 57 year old man with history of cervical myelopathy s/p anterior cervical discectomy and fusion 06/05/23 admitted to our service from inpatient rehab for sepsis and treated for Klebsiella bacteremia 2/2 cystitis.   Hospital Day 3  Patient feeling similar to yesterday. His left knee feels stiff and mildly tender, similar to when he isn't able to get around and move it. He feels physical therapy and baclofen have been helpful. No new complaints. He is eager to get his C collar off and go to inpatient rehab when it is available.   Objective:  Vital signs in last 24 hours: Vitals:   06/16/23 2257 06/17/23 0517 06/17/23 0521 06/17/23 0838  BP:  (!) 83/56  104/68  Pulse:    98  Resp:    17  Temp: 98.2 F (36.8 C) 98.2 F (36.8 C)  97.9 F (36.6 C)  TempSrc: Oral Oral  Oral  SpO2:  100%  98%  Weight:   99.8 kg   Height:       Supplemental O2: Room Air SpO2: 98 %  Physical Exam:  Constitutional: chronically ill appearing, neck in a C collar Cardiovascular: RRR, no murmurs, rubs or gallops Pulmonary/Chest: normal work of breathing on room air, lungs clear to auscultation bilaterally Abdominal: soft, non-tender, non-distended MSK: L knee warm and slightly tender to palpation, no palpable effusion  Pertinent Labs:    Latest Ref Rng & Units 06/17/2023    2:58 AM 06/16/2023    3:16 AM 06/15/2023    2:49 AM  CBC  WBC 4.0 - 10.5 K/uL 7.6  7.6  6.5   Hemoglobin 13.0 - 17.0 g/dL 16.1  09.6  04.5   Hematocrit 39.0 - 52.0 % 29.5  30.3  28.5   Platelets 150 - 400 K/uL 346  339  288        Latest Ref Rng & Units 06/17/2023    2:58 AM 06/14/2023    3:41 AM 06/13/2023   11:32 PM  CMP  Glucose 70 - 99 mg/dL 409  811    BUN 6 - 20 mg/dL 8  8    Creatinine 9.14 - 1.24 mg/dL 7.82  9.56  2.13   Sodium 135 - 145 mmol/L 136  136    Potassium 3.5 - 5.1 mmol/L 3.7  3.9    Chloride 98 - 111 mmol/L 103  107    CO2 22 - 32 mmol/L 26   22    Calcium 8.9 - 10.3 mg/dL 9.2  8.7      Assessment/Plan:   #Bacteremia #Acute cystitis Bacteremia presumably secondary to cystitis. Improving, no longer septic. Will complete 7 day course of antibiotics on 12/25. Continuing levofloxacin 750mg  daily  #Cervical myelopathy Subacute, progressive over months. Neurosurgery following. Anticipate discharge to CIR for ongoing rehab soon. Spoke with CIR coordinator who stated as patient has only recently been able to tolerate physical therapy he will not be able to go to inpatient rehab today.   Diet: Regular VTE: Lovenox Code: Full  Dispo: Anticipated discharge to Rehab pending medical stability.   Monna Fam, MD PGY-1 Internal Medicine Resident Pager Number [phone] Please contact the on call pager after 5 pm and on weekends at [phone].

## 2023-06-17 NOTE — Progress Notes (Signed)
Physical Therapy Treatment Patient Details Name: Leon Rodriguez MRN: [id number] DOB: 1965-10-13 Today's Date: 06/17/2023   History of Present Illness Pt 57 yo male s/p C6-T1 ACDF on 12/11 for severe cervical stenosis with cord compression consistent with myelopathy. Pt discharged to CIR at [facility] on 12/13 and transfered back to acute care on 12/19 due to tachycardia, fever, and suspected sepsis. PMH includes OA, hiatal hernia repair, urinary retention and incontinence    PT Comments  Pt greeted resting in bed, eager for mobility and demonstrating good progress towards acute goals. Pt continues to be limited by LE spasms, variable in extension and flexion, with increased time and physical assist needed to position Les in neutral for functional mobility. Pt requiring max A to come to sitting EOB, pt with initial posterior lean due to R knee/hip extension, with increased time and overpressure applied toward flexion pt able to maintain sitting balance with CGA for safety. Pt able to elevate hips from EOB x5 trials in stedy frame with max A to boost and cues for trunk elevation. Pt transferred to personal W/C via stedy and self-propelling w/c in hall at supervision level. Pt up in W/C at end of session with RN aware and pt spouse at bedside. Current plan remains appropriate to address deficits and maximize functional independence and decrease caregiver burden. Pt continues to benefit from skilled PT services to progress toward functional mobility goals.     If plan is discharge home, recommend the following: A lot of help with walking and/or transfers;Assist for transportation;Help with stairs or ramp for entrance;Assistance with cooking/housework   Can travel by private vehicle        Equipment Recommendations  Other (comment);Wheelchair cushion (measurements PT);Wheelchair (measurements PT);Hospital bed (defer to post acute)    Recommendations for Other Services       Precautions / Restrictions  Precautions Precautions: Fall;Cervical Precaution Booklet Issued: Yes (comment) Precaution Comments: hx of many falls, severe B LE hypertonia with muscle spasms, verbally went through cervical precautions throughout session Required Braces or Orthoses: Cervical Brace Cervical Brace: Hard collar (out of bed) Restrictions Weight Bearing Restrictions Per Provider Order: No Other Position/Activity Restrictions: cervical precautions     Mobility  Bed Mobility Overal bed mobility: Needs Assistance Bed Mobility: Supine to Sit, Sit to Supine Rolling: Min assist, Used rails Sidelying to sit: Max assist, HOB elevated, Used rails       General bed mobility comments: assist to guide LEs to and off EOB and max A to elevate trunk, initial posterior lean due to hip/R knee extension spasm, able to apply overpressure to RLE to bring R knee to flexion and pt able to maintain upright sitting    Transfers Overall transfer level: Needs assistance Equipment used: Ambulation equipment used Transfers: Sit to/from Stand, Bed to chair/wheelchair/BSC Sit to Stand: Max assist, Mod assist           General transfer comment: max A to eleavate hips from EOB initially, with subsequent trials down to mod A to clear hips, increased time for rest between trials due to flexion spasms Transfer via Lift Equipment: Stedy  Ambulation/Gait                   Psychologist, counselling mobility: Yes Wheelchair propulsion: Both upper extremities Wheelchair parts: Independent Distance: 60 Wheelchair Assistance Details (indicate cue type and reason): pt able to self propel personal W/C in hall  with BUE, distance limited by fatigue   Tilt Bed    Modified Rankin (Stroke Patients Only)       Balance Overall balance assessment: Needs assistance, History of Falls Sitting-balance support: Single extremity supported, Bilateral upper extremity supported,  No upper extremity supported, Feet supported Sitting balance-Leahy Scale: Poor Sitting balance - Comments: increased time to acheive sitting balance due to spasms, variable from mod-CGA   Standing balance support: Bilateral upper extremity supported, During functional activity Standing balance-Leahy Scale: Poor Standing balance comment: BUE support of stedy rail and max A to maintain                            Cognition Arousal: Alert Behavior During Therapy: WFL for tasks assessed/performed Overall Cognitive Status: Within Functional Limits for tasks assessed                                 General Comments: AAOx4 and pleasant throughout session. Able to state cervical precautions Independently but requiring cues to adhere to precautions throughout session. Able to follow 2 and 3-step instructions consistently.        Exercises      General Comments General comments (skin integrity, edema, etc.): pt spouse present and supportive      Pertinent Vitals/Pain Pain Assessment Pain Assessment: Faces Faces Pain Scale: Hurts even more Pain Location: LE, LB Pain Descriptors / Indicators: Spasm Pain Intervention(s): Monitored during session, Limited activity within patient's tolerance, Repositioned    Home Living                          Prior Function            PT Goals (current goals can now be found in the care plan section) Acute Rehab PT Goals Patient Stated Goal: Return to rehab PT Goal Formulation: With patient/family Time For Goal Achievement: 06/28/23 Progress towards PT goals: Progressing toward goals    Frequency    Min 1X/week      PT Plan      Co-evaluation              AM-PAC PT "6 Clicks" Mobility   Outcome Measure  Help needed turning from your back to your side while in a flat bed without using bedrails?: Total Help needed moving from lying on your back to sitting on the side of a flat bed without using  bedrails?: Total Help needed moving to and from a bed to a chair (including a wheelchair)?: Total Help needed standing up from a chair using your arms (e.g., wheelchair or bedside chair)?: Total Help needed to walk in hospital room?: Total Help needed climbing 3-5 steps with a railing? : Total 6 Click Score: 6    End of Session Equipment Utilized During Treatment: Cervical collar Activity Tolerance: Patient tolerated treatment well Patient left: with call bell/phone within reach;with family/visitor present;in chair;Other (comment) (in personal W/C) Nurse Communication: Mobility status;Need for lift equipment (lift pad placed under pt and RN aware of need for maxi-move to return pt to bed) PT Visit Diagnosis: Other abnormalities of gait and mobility (R26.89);Muscle weakness (generalized) (M62.81)     Time: 9629-5284 PT Time Calculation (min) (ACUTE ONLY): 32 min  Charges:    $Therapeutic Activity: 23-37 mins PT General Charges $$ ACUTE PT VISIT: 1 Visit  Lenora Boys. PTA Acute Rehabilitation Services Office: [phone]   Catalina Antigua 06/17/2023, 10:26 AM

## 2023-06-17 NOTE — PMR Pre-admission (Signed)
PMR Admission Coordinator Pre-Admission Assessment  Patient: Leon Rodriguez is an 57 y.o., male MRN: [id number] DOB: [birth year] Height: 5\' 9"  (175.3 cm) Weight: 99.8 kg  Insurance Information HMO:     PPO:      PCP:      IPA:      80/20:      OTHER: Group [id number] PRIMARY: [facility]      Policy#: [id number]      Subscriber: self CM Name: Brett Canales      Phone#: [phone]     Fax#: [phone] Pre-Cert#:[id number] approved 12/24 until 06/24/23    Employer: Currently on LOA Benefits:  Phone #: [phone]     Name: Verified 06/06/23 Eff. Date: 03/26/23     Deduct: $0      Out of Pocket Max: $0      Life Max: n/a  CIR: 100%      SNF: 100% Outpatient:       Co-Pay: $4 copay/visit Home Health: 100%      Co-Pay: none DME: 100%     Co-Pay: none Providers: in network  SECONDARY:       Policy#:       Phone#:   Artist:       Phone#:   The Data processing manager" for patients in Inpatient Rehabilitation Facilities with attached "Privacy Act Statement-Health Care Records" was provided and verbally reviewed with: Patient  Emergency Contact Information Contact Information     Name Relation Home Work Mobile   [name],[name] Significant other   [phone]      Other Contacts   None on File    Current Medical History  Patient Admitting Diagnosis: C6-T1 cervical myelopathy   History of Present Illness: A 57 y.o. male with a history of worsening bilateral lower extremity weakness and sensory loss progressing over several months.  He had progressed to the point where patient was requiring a wheelchair for mobility.  Patient developed spasticity as well.  MRI of the cervical spine revealed a C6-T1 severe cervical stenosis with cord compression and myelopathy.  Patient presented on 06/05/2023 for C6-T1 ACDF by Dr. Jake Samples.  Patient with significant lower extremity spasms this morning.  Valium was added and baclofen was increased.  He remains in a  cervical collar. Pt discharged to CIR  on 06/07/23 and transfered back to acute care on 06/13/23 due to tachycardia, fever, and suspected sepsis. Found to have bacteremia and acute cystitis. Pt With Klebsiella aerogenes, presumably from cystitis.  He completed a 7-day course of antibiotics on 06/19/2023.  PT/OT evaluations completed with recommendations for acute inpatient rehab admission.    Patient's medical record from [facility] has been reviewed by the rehabilitation admission coordinator and physician.  Past Medical History  Past Medical History:  Diagnosis Date   Arthritis    Hip pain    Sepsis (HCC) 06/13/2023   Has the patient had major surgery during 100 days prior to admission? Yes  Family History   family history is not on file.  Current Medications  Current Facility-Administered Medications:    acetaminophen (TYLENOL) tablet 650 mg, 650 mg, Oral, Q6H, Annett Fabian, MD, 650 mg at 06/18/23 0830   alum & mag hydroxide-simeth (MAALOX/MYLANTA) 200-200-20 MG/5ML suspension 30 mL, 30 mL, Oral, Q4H PRN, Morene Crocker, MD   baclofen (LIORESAL) tablet 20 mg, 20 mg, Oral, Q6H, Gomez-Caraballo, Maria, MD, 20 mg at 06/18/23 0830   bisacodyl (DULCOLAX) suppository 10 mg, 10 mg, Rectal, Daily PRN, Rocky Morel,  DO   Chlorhexidine Gluconate Cloth 2 % PADS 6 each, 6 each, Topical, Daily, Tyson Alias, MD, 6 each at 06/18/23 1046   diazepam (VALIUM) tablet 5 mg, 5 mg, Oral, Q6H, Morene Crocker, MD, 5 mg at 06/18/23 0830   enoxaparin (LOVENOX) injection 40 mg, 40 mg, Subcutaneous, Q24H, Morene Crocker, MD, 40 mg at 06/18/23 0829   guaiFENesin (MUCINEX) 12 hr tablet 600 mg, 600 mg, Oral, BID, Morene Crocker, MD, 600 mg at 06/18/23 0830   guaiFENesin-dextromethorphan (ROBITUSSIN DM) 100-10 MG/5ML syrup 10 mL, 10 mL, Oral, Q6H PRN, Morene Crocker, MD   levofloxacin (LEVAQUIN) tablet 750 mg, 750 mg, Oral, Daily, Jenita Seashore, RPH, 750 mg at 06/17/23 1801   lidocaine (LIDODERM) 5 % 1 patch, 1 patch, Transdermal, Q24H, Annett Fabian, MD, 1 patch at 06/18/23 1046   lidocaine (XYLOCAINE) 2 % jelly 1 Application, 1 Application, Urethral, Q6H PRN, Morene Crocker, MD, 1 Application at 06/14/23 0011   menthol-cetylpyridinium (CEPACOL) lozenge 3 mg, 1 lozenge, Oral, PRN, Morene Crocker, MD   morphine (MS CONTIN) 12 hr tablet 15 mg, 15 mg, Oral, BID, Morene Crocker, MD, 15 mg at 06/18/23 0830   ondansetron (ZOFRAN) tablet 4 mg, 4 mg, Oral, Q6H PRN **OR** ondansetron (ZOFRAN) injection 4 mg, 4 mg, Intravenous, Q6H PRN, Morene Crocker, MD   Oral care mouth rinse, 15 mL, Mouth Rinse, PRN, Morene Crocker, MD   oxyCODONE (Oxy IR/ROXICODONE) immediate release tablet 5 mg, 5 mg, Oral, Q4H PRN, Marrianne Mood, MD, 5 mg at 06/17/23 0228   polyethylene glycol (MIRALAX / GLYCOLAX) packet 17 g, 17 g, Oral, Daily, Marrianne Mood, MD, 17 g at 06/18/23 0829   [START ON 06/19/2023] senna-docusate (Senokot-S) tablet 2 tablet, 2 tablet, Oral, QHS, Rocky Morel, DO   tamsulosin [facility]) capsule 0.4 mg, 0.4 mg, Oral, QPC supper, Morene Crocker, MD, 0.4 mg at 06/17/23 1801   tiZANidine (ZANAFLEX) tablet 4 mg, 4 mg, Oral, Q6H PRN, Morene Crocker, MD, 4 mg at 06/17/23 2204  Patients Current Diet:  Diet Order             Diet regular Room service appropriate? Yes; Fluid consistency: Thin  Diet effective now                  Precautions / Restrictions Precautions Precautions: Fall, Cervical Precaution Booklet Issued: Yes (comment) Precaution Comments: hx of many falls, severe B LE hypertonia with muscle spasms, verbally went through cervical precautions throughout session Cervical Brace: Hard collar Restrictions Weight Bearing Restrictions Per Provider Order: No Other Position/Activity Restrictions: cervical precautions   Has the patient had 2 or more falls or  a fall with injury in the past year? Yes  Prior Activity Level Household: went out for appointments onlyPt. Went out for appts only (household) Self Care: Did the patient need help bathing, dressing, using the toilet or eating?  Needed some help   Indoor Mobility: Did the patient need assistance with walking from room to room (with or without device)? Needed some help   Stairs: Did the patient need assistance with internal or external stairs (with or without device)? Needed some help   Functional Cognition: Did the patient need help planning regular tasks such as shopping or remembering to take medications? Independent   Patient Information Are you of Hispanic, Latino/a,or Spanish origin?: A. No, not of Hispanic, Latino/a, or Spanish origin What is your race?: B. Black or African American Do you need or want an interpreter to communicate with a doctor or  health care staff?: 0. No   Patient's Response To:  Health Literacy and Transportation Is the patient able to respond to health literacy and transportation needs?: Yes Health Literacy - How often do you need to have someone help you when you read instructions, pamphlets, or other written material from your doctor or pharmacy?: Never In the past 12 months, has lack of transportation kept you from medical appointments or from getting medications?: No In the past 12 months, has lack of transportation kept you from meetings, work, or from getting things needed for daily living?: No   Journalist, newspaper / Equipment Home Equipment: Information systems manager, Higher education careers adviser held shower head, Agricultural consultant (2 wheels), [employer] - single point, Wheelchair - manual, Crutches, Toilet riser   Prior Device Use: Indicate devices/aids used by the patient prior to current illness, exacerbation or injury? Manual wheelchair and Walker  Current Functional Level Cognition  Overall Cognitive Status: Within Functional Limits for tasks assessed Orientation Level: Oriented  X4 General Comments: able to fully participate    Extremity Assessment (includes Sensation/Coordination)  Upper Extremity Assessment: Right hand dominant, Generalized weakness  Lower Extremity Assessment: Defer to PT evaluation    ADLs  Overall ADL's : Needs assistance/impaired Eating/Feeding: Modified independent, Bed level Grooming: Minimal assistance, Bed level Upper Body Bathing: Maximal assistance, Bed level, Cueing for compensatory techniques (adhering to cervical precautions) Lower Body Bathing: Total assistance, +2 for physical assistance, +2 for safety/equipment, Bed level (adhering to cervical precautions) Upper Body Dressing : Maximal assistance, Sitting Lower Body Dressing: Total assistance, Bed level Lower Body Dressing Details (indicate cue type and reason): for donning/doffing socks Toilet Transfer Details (indicate cue type and reason): deferred this session for pt/therapist safety Toileting- Clothing Manipulation and Hygiene: Total assistance, +2 for safety/equipment, +2 for physical assistance, Bed level General ADL Comments: Pt bed level for most ADLs at this time, able to roll with min A for toileting hygiene in bed, L knee flexion spasms limit ability to turn and position in bed. Pt max A for UB dressing sitting EOB, difficulty maintaining balance while sitting to participate in ADLs    Mobility  Overal bed mobility: Needs Assistance Bed Mobility: Rolling, Supine to Sit, Sit to Supine Rolling: Min assist, Used rails Sidelying to sit: Max assist, HOB elevated, Used rails Supine to sit: Max assist, +2 for physical assistance, +2 for safety/equipment, HOB elevated Sit to supine: Max assist, +2 for physical assistance, Used rails General bed mobility comments: max A for sitting EOB and lying back down, not able to assist with BLEs. Rolls with min A L/R    Transfers  Overall transfer level: Needs assistance Equipment used: Ambulation equipment used Transfers: Sit  to/from Stand, Bed to chair/wheelchair/BSC Sit to Stand: Max assist, +2 physical assistance, +2 safety/equipment, From elevated surface, Via lift equipment Bed to/from chair/wheelchair/BSC transfer type:: Via Financial planner via Lift Equipment: Maximove General transfer comment: maximove for w/c to bed, not able to stand up to use [place] today during session after several attempts.    Ambulation / Gait / Stairs / Wheelchair Mobility  Ambulation/Gait General Gait Details: unable at this time. Wheelchair Mobility Wheelchair mobility: Yes Wheelchair propulsion: Both upper extremities Wheelchair parts: Independent Distance: 60 Wheelchair Assistance Details (indicate cue type and reason): pt able to self propel personal W/C in hall with BUE, distance limited by fatigue    Posture / Balance Dynamic Sitting Balance Sitting balance - Comments: poor sitting balance, difficulty placing BLEs in comfortable position to allow for proper  sitting balance Balance Overall balance assessment: Needs assistance, History of Falls Sitting-balance support: Single extremity supported, Bilateral upper extremity supported, No upper extremity supported, Feet supported Sitting balance-Leahy Scale: Poor Sitting balance - Comments: poor sitting balance, difficulty placing BLEs in comfortable position to allow for proper sitting balance Postural control: Posterior lean Standing balance support: Bilateral upper extremity supported, During functional activity, Reliant on assistive device for balance Standing balance-Leahy Scale: Zero Standing balance comment: was not able to stand with Stedy, does not clear bed high enough to use Stedy consistently, not able to maintain for more than 3-4 seconds after repeated attempts.    Special needs/care consideration    Previous Home Environment  Living Arrangements: Spouse/significant other  Lives With: Significant other Available Help at Discharge: Family, Available 24  hours/day Type of Home: House Home Layout: Two level, Bed/bath upstairs, 1/2 bath on main level Alternate Level Stairs-Number of Steps: flight Home Access: Stairs to enter Entrance Stairs-Rails: None Entrance Stairs-Number of Steps: 1 Bathroom Shower/Tub: Engineer, manufacturing systems: Standard Additional Comments: reports started using w/c in June 2024 - he uses the wheelchair downstairs for more mobility, goes downstairs 1x/day to smoke a cigar (but hasn't been downstairs in ~34month PTA), uses RW upstairs for very short distance gait to/from bathroom for BM otherwise uses urinal for bladder -- S.O. 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 position  Discharge Living Setting Plans for Discharge Living Setting: Patient's home, Lives with (comment) (spouse/significant other) Type of Home at Discharge: House Discharge Home Layout: Two level, Bed/bath upstairs, 1/2 bath on main level Alternate Level Stairs-Number of Steps: flight Discharge Home Access: Stairs to enter Entrance Stairs-Rails: None Entrance Stairs-Number of Steps: 1 Discharge Bathroom Shower/Tub: Tub/shower unit Discharge Bathroom Toilet: Standard Discharge Bathroom Accessibility: Yes How Accessible: Accessible via walker Does the patient have any problems obtaining your medications?: No  Social/Family/Support Systems Patient Roles: Airline pilot Information: [name] Anticipated Caregiver: wife Anticipated Industrial/product designer Information: see contacts Ability/Limitations of Caregiver: wife to continue FMLA Caregiver Availability: 24/7 Discharge Plan Discussed with Primary Caregiver: Yes Is Caregiver In Agreement with Plan?: Yes Does Caregiver/Family have Issues with Lodging/Transportation while Pt is in Rehab?: No  Social/Family/Support Systems Patient Roles: Partner (Has SO, sister, brother) Solicitor Information: [name] Laural Benes -  SO [phone] Anticipated Caregiver: SO Ability/Limitations of Caregiver: [name] plans to return to work in February 2025. Caregiver Availability: Intermittent Discharge Plan Discussed with Primary Caregiver: Yes Is Caregiver In Agreement with Plan?: Yes Does Caregiver/Family have Issues with Lodging/Transportation while Pt is in Rehab?: No  Goals Patient/Family Goal for Rehab: superivsion to min assist aith PT and OT Expected length of stay: ELOS 3 to 4 weeks Pt/Family Agrees to Admission and willing to participate: Yes Program Orientation Provided & Reviewed with Pt/Caregiver Including Roles  & Responsibilities: YesPatient/Family Goal for Rehab: PT/OT mod I and supervision goals Expected length of stay: 18-24 days Pt/Family Agrees to Admission and willing to participate: Yes Program Orientation Provided & Reviewed with Pt/Caregiver Including Roles  & Responsibilities: Yes  Decrease burden of Care through IP rehab admission: Not anticipated  Possible need for SNF placement upon discharge: not anticipated  Patient Condition: I have reviewed medical records from [facility] , spoken with CM, and patient and spouse. I met with patient at the bedside for inpatient rehabilitation assessment.  Patient will benefit from ongoing PT, OT, and SLP, can actively  participate in 3 hours of therapy a day 5 days of the week, and can make measurable gains during the admission.  Patient will also benefit from the coordinated team approach during an Inpatient Acute Rehabilitation admission.  The patient will receive intensive therapy as well as Rehabilitation physician, nursing, social worker, and care management interventions.  Due to bladder management, bowel management, safety, skin/wound care, disease management, medication administration, pain management, and patient education the patient requires 24 hour a day rehabilitation nursing.  The patient is currently max assist overall with  mobility and basic ADLs.  Discharge setting and therapy post discharge at home with home health is anticipated.  Patient has agreed to participate in the Acute Inpatient Rehabilitation Program and will admit today.  Preadmission Screen Completed [id number] Staley with updates by  Clois Dupes, 06/18/2023 10:51 AM ______________________________________________________________________   Discussed status with Dr. Berline Chough on 06/18/23 at 1050 and received approval for admission today.  Admission Coordinator:  Tanice Petre Salon with updates by Clois Dupes, RN, time 1050 Date 06/18/23   Assessment/Plan: Diagnosis: cervical myelopathy with severe spasticity Does the need for close, 24 hr/day Medical supervision in concert with the patient's rehab needs make it unreasonable for this patient to be served in a less intensive setting? Yes Co-Morbidities requiring supervision/potential complications: Recetn sepsis due to bacteremia; spasticity, pain; not ocntrolled- and weakness due to severe cervicalmyelopathy- neurogenic bowel and bladder Due to bladder management, bowel management, safety, skin/wound care, disease management, medication administration, pain management, and patient education, does the patient require 24 hr/day rehab nursing? Yes Does the patient require coordinated care of a physician, rehab nurse, PT, OT, and SLP to address physical and functional deficits in the context of the above medical diagnosis(es)? Yes Addressing deficits in the following areas: balance, endurance, locomotion, strength, transferring, bowel/bladder control, bathing, dressing, feeding, grooming, and toileting Can the patient actively participate in an intensive therapy program of at least 3 hrs of therapy 5 days a week? Yes The potential for patient to make measurable gains while on inpatient rehab is good Anticipated functional outcomes upon discharge from inpatient rehab: supervision PT, supervision OT,  n/a SLP Estimated rehab length of stay to reach the above functional goals is: 3-4 weeks Anticipated discharge destination: Home 10. Overall Rehab/Functional Prognosis: good   MD Signature:

## 2023-06-17 NOTE — Plan of Care (Signed)
  Problem: Education: Goal: Knowledge of General Education information will improve Description: Including pain rating scale, medication(s)/side effects and non-pharmacologic comfort measures Outcome: Progressing   Problem: Health Behavior/Discharge Planning: Goal: Ability to manage health-related needs will improve Outcome: Progressing   Problem: Clinical Measurements: Goal: Ability to maintain clinical measurements within normal limits will improve Outcome: Progressing Goal: Will remain free from infection Outcome: Progressing Goal: Diagnostic test results will improve Outcome: Progressing Goal: Respiratory complications will improve Outcome: Progressing Goal: Cardiovascular complication will be avoided Outcome: Progressing   Problem: Activity: Goal: Risk for activity intolerance will decrease Outcome: Progressing   Problem: Nutrition: Goal: Adequate nutrition will be maintained Outcome: Progressing   Problem: Coping: Goal: Level of anxiety will decrease Outcome: Progressing   Problem: Pain Management: Goal: General experience of comfort will improve Outcome: Progressing   Problem: Safety: Goal: Ability to remain free from injury will improve Outcome: Progressing   Problem: Skin Integrity: Goal: Risk for impaired skin integrity will decrease Outcome: Progressing

## 2023-06-18 ENCOUNTER — Other Ambulatory Visit: Payer: Self-pay

## 2023-06-18 ENCOUNTER — Inpatient Hospital Stay (HOSPITAL_COMMUNITY)
Admission: AD | Admit: 2023-06-18 | Discharge: 2023-07-12 | DRG: 552 | Disposition: A | Discharge status: Other Acute Inpatient Hospital | Payer: Medicaid Other | Source: Intra-hospital | Attending: Physical Medicine and Rehabilitation | Admitting: Physical Medicine and Rehabilitation

## 2023-06-18 ENCOUNTER — Encounter (HOSPITAL_COMMUNITY): Payer: Self-pay | Admitting: Physical Medicine and Rehabilitation

## 2023-06-18 DIAGNOSIS — B9689 Other specified bacterial agents as the cause of diseases classified elsewhere: Secondary | ICD-10-CM | POA: Diagnosis present

## 2023-06-18 DIAGNOSIS — Z5329 Procedure and treatment not carried out because of patient's decision for other reasons: Secondary | ICD-10-CM | POA: Diagnosis not present

## 2023-06-18 DIAGNOSIS — M25562 Pain in left knee: Secondary | ICD-10-CM | POA: Diagnosis present

## 2023-06-18 DIAGNOSIS — R410 Disorientation, unspecified: Secondary | ICD-10-CM | POA: Diagnosis not present

## 2023-06-18 DIAGNOSIS — E871 Hypo-osmolality and hyponatremia: Secondary | ICD-10-CM | POA: Diagnosis present

## 2023-06-18 DIAGNOSIS — T402X5A Adverse effect of other opioids, initial encounter: Secondary | ICD-10-CM | POA: Diagnosis not present

## 2023-06-18 DIAGNOSIS — G4700 Insomnia, unspecified: Secondary | ICD-10-CM | POA: Diagnosis not present

## 2023-06-18 DIAGNOSIS — G8220 Paraplegia, unspecified: Secondary | ICD-10-CM | POA: Diagnosis not present

## 2023-06-18 DIAGNOSIS — Z6832 Body mass index (BMI) 32.0-32.9, adult: Secondary | ICD-10-CM

## 2023-06-18 DIAGNOSIS — R252 Cramp and spasm: Secondary | ICD-10-CM | POA: Diagnosis not present

## 2023-06-18 DIAGNOSIS — F4321 Adjustment disorder with depressed mood: Secondary | ICD-10-CM | POA: Diagnosis present

## 2023-06-18 DIAGNOSIS — R339 Retention of urine, unspecified: Secondary | ICD-10-CM | POA: Diagnosis present

## 2023-06-18 DIAGNOSIS — M4803 Spinal stenosis, cervicothoracic region: Principal | ICD-10-CM | POA: Diagnosis present

## 2023-06-18 DIAGNOSIS — G959 Disease of spinal cord, unspecified: Secondary | ICD-10-CM | POA: Diagnosis not present

## 2023-06-18 DIAGNOSIS — F17210 Nicotine dependence, cigarettes, uncomplicated: Secondary | ICD-10-CM | POA: Diagnosis present

## 2023-06-18 DIAGNOSIS — A499 Bacterial infection, unspecified: Secondary | ICD-10-CM | POA: Diagnosis not present

## 2023-06-18 DIAGNOSIS — N319 Neuromuscular dysfunction of bladder, unspecified: Secondary | ICD-10-CM | POA: Diagnosis present

## 2023-06-18 DIAGNOSIS — R5381 Other malaise: Secondary | ICD-10-CM | POA: Diagnosis present

## 2023-06-18 DIAGNOSIS — K592 Neurogenic bowel, not elsewhere classified: Secondary | ICD-10-CM | POA: Diagnosis present

## 2023-06-18 DIAGNOSIS — R7881 Bacteremia: Secondary | ICD-10-CM | POA: Diagnosis present

## 2023-06-18 DIAGNOSIS — N3090 Cystitis, unspecified without hematuria: Secondary | ICD-10-CM | POA: Diagnosis present

## 2023-06-18 DIAGNOSIS — M792 Neuralgia and neuritis, unspecified: Secondary | ICD-10-CM | POA: Diagnosis not present

## 2023-06-18 DIAGNOSIS — N390 Urinary tract infection, site not specified: Secondary | ICD-10-CM | POA: Diagnosis not present

## 2023-06-18 DIAGNOSIS — M62838 Other muscle spasm: Secondary | ICD-10-CM | POA: Diagnosis present

## 2023-06-18 DIAGNOSIS — Z79899 Other long term (current) drug therapy: Secondary | ICD-10-CM

## 2023-06-18 DIAGNOSIS — E669 Obesity, unspecified: Secondary | ICD-10-CM | POA: Diagnosis present

## 2023-06-18 DIAGNOSIS — G629 Polyneuropathy, unspecified: Secondary | ICD-10-CM | POA: Diagnosis present

## 2023-06-18 DIAGNOSIS — Z981 Arthrodesis status: Secondary | ICD-10-CM | POA: Diagnosis not present

## 2023-06-18 LAB — CULTURE, BLOOD (ROUTINE X 2)
Culture: NO GROWTH
Culture: NO GROWTH

## 2023-06-18 LAB — BASIC METABOLIC PANEL
Anion gap: 8 (ref 5–15)
BUN: 9 mg/dL (ref 6–20)
CO2: 26 mmol/L (ref 22–32)
Calcium: 9.4 mg/dL (ref 8.9–10.3)
Chloride: 101 mmol/L (ref 98–111)
Creatinine, Ser: 0.77 mg/dL (ref 0.61–1.24)
GFR, Estimated: >60 mL/min (ref >60)
Glucose, Bld: 110 mg/dL — ABNORMAL HIGH (ref 70–99)
Potassium: 4.1 mmol/L (ref 3.5–5.1)
Sodium: 135 mmol/L (ref 135–145)

## 2023-06-18 LAB — CBC
HCT: 29.8 % — ABNORMAL LOW (ref 39.0–52.0)
Hemoglobin: 10.4 g/dL — ABNORMAL LOW (ref 13.0–17.0)
MCH: 33.7 pg (ref 26.0–34.0)
MCHC: 34.9 g/dL (ref 30.0–36.0)
MCV: 96.4 fL (ref 80.0–100.0)
Platelets: 373 10*3/uL (ref 150–400)
RBC: 3.09 MIL/uL — ABNORMAL LOW (ref 4.22–5.81)
RDW: 12.5 % (ref 11.5–15.5)
WBC: 8.1 10*3/uL (ref 4.0–10.5)
nRBC: 0 % (ref 0.0–0.2)

## 2023-06-18 LAB — GLUCOSE, CAPILLARY: Glucose-Capillary: 105 mg/dL — ABNORMAL HIGH (ref 70–99)

## 2023-06-18 MED ORDER — PROCHLORPERAZINE 25 MG RE SUPP: 12.5000 mg | Freq: Four times a day (QID) | RECTAL | Status: DC | PRN

## 2023-06-18 MED ORDER — DIAZEPAM 5 MG PO TABS: 5.0000 mg | ORAL_TABLET | Freq: Four times a day (QID) | ORAL | 0 refills | Status: DC

## 2023-06-18 MED ORDER — LIDOCAINE HCL URETHRAL/MUCOSAL 2 % EX GEL
CUTANEOUS | Status: DC | PRN
  Administered 2023-06-19: 1 via TOPICAL
  Administered 2023-06-20 – 2023-07-06 (×5): 6 via TOPICAL
  Filled 2023-06-18 (×3): qty 6
  Filled 2023-06-18: qty 18
  Filled 2023-06-18 (×7): qty 6

## 2023-06-18 MED ORDER — SENNOSIDES-DOCUSATE SODIUM 8.6-50 MG PO TABS: 2.0000 | ORAL_TABLET | Freq: Every day | ORAL | Status: DC

## 2023-06-18 MED ORDER — MENTHOL 3 MG MT LOZG: 1.0000 | LOZENGE | OROMUCOSAL | Status: DC | PRN

## 2023-06-18 MED ORDER — CHLORHEXIDINE GLUCONATE CLOTH 2 % EX PADS
6.0000 | MEDICATED_PAD | Freq: Every day | CUTANEOUS | Status: DC
  Administered 2023-06-19 – 2023-06-20 (×2): 6 via TOPICAL

## 2023-06-18 MED ORDER — LIDOCAINE HCL URETHRAL/MUCOSAL 2 % EX GEL: 1.0000 | Freq: Four times a day (QID) | CUTANEOUS | 1 refills | Status: DC | PRN

## 2023-06-18 MED ORDER — MORPHINE SULFATE ER 15 MG PO TBCR
15.0000 mg | EXTENDED_RELEASE_TABLET | Freq: Two times a day (BID) | ORAL | Status: DC
  Administered 2023-06-18 – 2023-06-30 (×25): 15 mg via ORAL
  Filled 2023-06-18 (×25): qty 1

## 2023-06-18 MED ORDER — SENNOSIDES-DOCUSATE SODIUM 8.6-50 MG PO TABS: 2.0000 | ORAL_TABLET | Freq: Every day | ORAL | 0 refills | Status: DC

## 2023-06-18 MED ORDER — ENOXAPARIN SODIUM 40 MG/0.4ML IJ SOSY
40.0000 mg | PREFILLED_SYRINGE | INTRAMUSCULAR | Status: DC
  Administered 2023-06-18 – 2023-07-11 (×24): 40 mg via SUBCUTANEOUS
  Filled 2023-06-18 (×24): qty 0.4

## 2023-06-18 MED ORDER — TRAZODONE HCL 50 MG PO TABS
25.0000 mg | ORAL_TABLET | Freq: Every evening | ORAL | Status: DC | PRN
Start: 2023-06-18 — End: 2023-06-21
  Administered 2023-06-19 – 2023-06-20 (×2): 50 mg via ORAL
  Filled 2023-06-18 (×2): qty 1

## 2023-06-18 MED ORDER — DOCUSATE SODIUM 283 MG RE ENEM
1.0000 | ENEMA | RECTAL | Status: DC
  Administered 2023-06-20 – 2023-06-24 (×3): 283 mg via RECTAL
  Filled 2023-06-18 (×4): qty 1

## 2023-06-18 MED ORDER — ACETAMINOPHEN 325 MG PO TABS: 325.0000 mg | ORAL_TABLET | ORAL | Status: DC | PRN

## 2023-06-18 MED ORDER — OXYCODONE HCL 5 MG PO TABS
5.0000 mg | ORAL_TABLET | ORAL | Status: DC | PRN
  Administered 2023-06-19 – 2023-07-03 (×8): 5 mg via ORAL
  Filled 2023-06-18 (×8): qty 1

## 2023-06-18 MED ORDER — MENTHOL 3 MG MT LOZG: 1.0000 | LOZENGE | OROMUCOSAL | 12 refills | Status: DC | PRN

## 2023-06-18 MED ORDER — LIDOCAINE 5 % EX PTCH
1.0000 | MEDICATED_PATCH | CUTANEOUS | Status: DC
  Administered 2023-06-19 – 2023-07-11 (×18): 1 via TRANSDERMAL
  Filled 2023-06-18 (×20): qty 1

## 2023-06-18 MED ORDER — GUAIFENESIN ER 600 MG PO TB12
600.0000 mg | ORAL_TABLET | Freq: Two times a day (BID) | ORAL | Status: DC
  Administered 2023-06-18 – 2023-07-12 (×47): 600 mg via ORAL
  Filled 2023-06-18 (×48): qty 1

## 2023-06-18 MED ORDER — ONDANSETRON HCL 4 MG PO TABS: 4.0000 mg | ORAL_TABLET | Freq: Four times a day (QID) | ORAL | 0 refills | Status: DC | PRN

## 2023-06-18 MED ORDER — ALUM & MAG HYDROXIDE-SIMETH 200-200-20 MG/5ML PO SUSP: 30.0000 mL | ORAL | Status: DC | PRN

## 2023-06-18 MED ORDER — DIPHENHYDRAMINE HCL 25 MG PO CAPS: 25.0000 mg | ORAL_CAPSULE | Freq: Four times a day (QID) | ORAL | Status: DC | PRN

## 2023-06-18 MED ORDER — POLYETHYLENE GLYCOL 3350 17 G PO PACK: 17.0000 g | PACK | Freq: Every day | ORAL | Status: DC

## 2023-06-18 MED ORDER — TIZANIDINE HCL 4 MG PO TABS
4.0000 mg | ORAL_TABLET | Freq: Four times a day (QID) | ORAL | Status: DC | PRN
  Administered 2023-06-19: 4 mg via ORAL
  Filled 2023-06-18: qty 1

## 2023-06-18 MED ORDER — BISACODYL 10 MG RE SUPP: 10.0000 mg | Freq: Every day | RECTAL | Status: DC | PRN

## 2023-06-18 MED ORDER — GUAIFENESIN ER 600 MG PO TB12: 600.0000 mg | ORAL_TABLET | Freq: Two times a day (BID) | ORAL | 1 refills | Status: DC

## 2023-06-18 MED ORDER — LEVOFLOXACIN 750 MG PO TABS
750.0000 mg | ORAL_TABLET | Freq: Every day | ORAL | Status: AC
  Administered 2023-06-18 – 2023-06-19 (×2): 750 mg via ORAL
  Filled 2023-06-18 (×2): qty 1

## 2023-06-18 MED ORDER — ALUM & MAG HYDROXIDE-SIMETH 200-200-20 MG/5ML PO SUSP: 30.0000 mL | ORAL | 0 refills | Status: DC | PRN

## 2023-06-18 MED ORDER — TAMSULOSIN HCL 0.4 MG PO CAPS: 0.4000 mg | ORAL_CAPSULE | Freq: Every day | ORAL | 0 refills | Status: DC

## 2023-06-18 MED ORDER — ACETAMINOPHEN 325 MG PO TABS
650.0000 mg | ORAL_TABLET | Freq: Three times a day (TID) | ORAL | Status: DC
  Administered 2023-06-18 – 2023-07-12 (×94): 650 mg via ORAL
  Filled 2023-06-18 (×95): qty 2

## 2023-06-18 MED ORDER — BISACODYL 10 MG RE SUPP: 10.0000 mg | Freq: Every day | RECTAL | Status: DC

## 2023-06-18 MED ORDER — LEVOFLOXACIN 750 MG PO TABS: 750.0000 mg | ORAL_TABLET | Freq: Every day | ORAL | 0 refills | Status: AC

## 2023-06-18 MED ORDER — MORPHINE SULFATE ER 15 MG PO TBCR: 15.0000 mg | EXTENDED_RELEASE_TABLET | Freq: Two times a day (BID) | ORAL | 0 refills | Status: DC

## 2023-06-18 MED ORDER — DULOXETINE HCL 20 MG PO CPEP: 20.0000 mg | ORAL_CAPSULE | Freq: Every day | ORAL | Status: DC

## 2023-06-18 MED ORDER — GUAIFENESIN-DM 100-10 MG/5ML PO SYRP: 10.0000 mL | ORAL_SOLUTION | Freq: Four times a day (QID) | ORAL | 0 refills | Status: DC | PRN

## 2023-06-18 MED ORDER — FLEET ENEMA RE ENEM
1.0000 | ENEMA | Freq: Once | RECTAL | Status: AC | PRN
  Administered 2023-06-21: 1 via RECTAL
  Filled 2023-06-18: qty 1

## 2023-06-18 MED ORDER — BACLOFEN 20 MG PO TABS: 20.0000 mg | ORAL_TABLET | Freq: Four times a day (QID) | ORAL | 0 refills | Status: DC

## 2023-06-18 MED ORDER — BISACODYL 10 MG RE SUPP: 10.0000 mg | Freq: Every day | RECTAL | 0 refills | Status: DC | PRN

## 2023-06-18 MED ORDER — BACLOFEN 10 MG PO TABS
20.0000 mg | ORAL_TABLET | Freq: Four times a day (QID) | ORAL | Status: DC
  Administered 2023-06-18 – 2023-06-20 (×8): 20 mg via ORAL
  Filled 2023-06-18 (×8): qty 2

## 2023-06-18 MED ORDER — PROCHLORPERAZINE EDISYLATE 10 MG/2ML IJ SOLN: 5.0000 mg | Freq: Four times a day (QID) | INTRAMUSCULAR | Status: DC | PRN

## 2023-06-18 MED ORDER — FLEET ENEMA RE ENEM
1.0000 | ENEMA | RECTAL | Status: DC
  Filled 2023-06-18: qty 1

## 2023-06-18 MED ORDER — ACETAMINOPHEN 325 MG PO TABS: 650.0000 mg | ORAL_TABLET | Freq: Four times a day (QID) | ORAL | 0 refills | Status: DC

## 2023-06-18 MED ORDER — LIDOCAINE HCL URETHRAL/MUCOSAL 2 % EX GEL
1.0000 | Freq: Four times a day (QID) | CUTANEOUS | Status: DC | PRN
  Administered 2023-06-20 – 2023-07-01 (×4): 1 via URETHRAL
  Filled 2023-06-18: qty 12
  Filled 2023-06-18 (×14): qty 6

## 2023-06-18 MED ORDER — TAMSULOSIN HCL 0.4 MG PO CAPS
0.4000 mg | ORAL_CAPSULE | Freq: Every day | ORAL | Status: DC
  Administered 2023-06-18: 0.4 mg via ORAL
  Filled 2023-06-18: qty 1

## 2023-06-18 MED ORDER — TIZANIDINE HCL 4 MG PO TABS: 4.0000 mg | ORAL_TABLET | Freq: Four times a day (QID) | ORAL | 0 refills | Status: DC | PRN

## 2023-06-18 MED ORDER — POLYETHYLENE GLYCOL 3350 17 G PO PACK: 17.0000 g | PACK | Freq: Every day | ORAL | 0 refills | Status: DC

## 2023-06-18 MED ORDER — PROCHLORPERAZINE MALEATE 5 MG PO TABS
5.0000 mg | ORAL_TABLET | Freq: Four times a day (QID) | ORAL | Status: DC | PRN
Start: 2023-06-18 — End: 2023-07-12

## 2023-06-18 MED ORDER — SORBITOL 70 % SOLN
30.0000 mL | Freq: Every day | Status: DC | PRN
  Administered 2023-06-22 – 2023-06-27 (×3): 30 mL via ORAL
  Filled 2023-06-18 (×3): qty 30

## 2023-06-18 MED ORDER — LIDOCAINE 5 % EX PTCH: 1.0000 | MEDICATED_PATCH | CUTANEOUS | 0 refills | Status: DC

## 2023-06-18 MED ORDER — SENNOSIDES-DOCUSATE SODIUM 8.6-50 MG PO TABS
2.0000 | ORAL_TABLET | Freq: Every day | ORAL | Status: DC
  Administered 2023-06-18 – 2023-07-12 (×25): 2 via ORAL
  Filled 2023-06-18 (×25): qty 2

## 2023-06-18 MED ORDER — OXYCODONE HCL 5 MG PO TABS: 5.0000 mg | ORAL_TABLET | ORAL | 0 refills | Status: DC | PRN

## 2023-06-18 MED ORDER — DIAZEPAM 5 MG PO TABS
5.0000 mg | ORAL_TABLET | Freq: Four times a day (QID) | ORAL | Status: DC
  Administered 2023-06-18 – 2023-07-12 (×91): 5 mg via ORAL
  Filled 2023-06-18 (×95): qty 1

## 2023-06-18 MED ORDER — GUAIFENESIN-DM 100-10 MG/5ML PO SYRP
5.0000 mL | ORAL_SOLUTION | Freq: Four times a day (QID) | ORAL | Status: DC | PRN
Start: 2023-06-18 — End: 2023-07-12

## 2023-06-18 MED ORDER — FLEET ENEMA RE ENEM: 1.0000 | ENEMA | Freq: Every day | RECTAL | Status: DC

## 2023-06-18 MED ORDER — ORAL CARE MOUTH RINSE: 15.0000 mL | OROMUCOSAL | Status: DC | PRN

## 2023-06-18 MED ORDER — DULOXETINE HCL 20 MG PO CPEP
20.0000 mg | ORAL_CAPSULE | Freq: Every day | ORAL | Status: DC
  Administered 2023-06-19 – 2023-06-20 (×2): 20 mg via ORAL
  Filled 2023-06-18 (×2): qty 1

## 2023-06-18 NOTE — Progress Notes (Signed)
Genice Rouge, MD  Physician Physical Medicine and Rehabilitation   PMR Pre-admission    Signed   Date of Service: 06/17/2023  2:24 PM  Related encounter: Admission (Current) from 06/13/2023 in [facility] 4E CV SURGICAL PROGRESSIVE CARE   Signed     Expand All Collapse All  Show:Clear all [x] Written[x] Templated[x] Copied  Added by: [x] Standley Brooking, RN[x] Lovorn, [place], MD[x] Jeronimo Greaves, CCC-SLP  [] Hover for details PMR Admission Coordinator Pre-Admission Assessment   Patient: Leon Rodriguez is an 57 y.o., male MRN: [id number] DOB: 05-Jul-1965 Height: 5\' 9"  (175.3 cm) Weight: 99.8 kg   Insurance Information HMO:     PPO:      PCP:      IPA:      80/20:      OTHER: Group [id number] [facility]      Policy#: [id number]      Subscriber: self CM Name: Brett Canales      Phone#: [phone]     Fax#: [phone] Pre-Cert#:[id number] approved 12/24 until 06/24/23    Employer: Currently on LOA Benefits:  Phone #: [phone]     Name: Verified 06/06/23 Eff. Date: 03/26/23     Deduct: $0      Out of Pocket Max: $0      Life Max: n/a  CIR: 100%      SNF: 100% Outpatient:       Co-Pay: $4 copay/visit Home Health: 100%      Co-Pay: none DME: 100%     Co-Pay: none Providers: in network  SECONDARY:       Policy#:       Phone#:    Artist:       Phone#:    The Data processing manager" for patients in Inpatient Rehabilitation Facilities with attached "Privacy Act Statement-Health Care Records" was provided and verbally reviewed with: Patient   Emergency Contact Information Contact Information       Name Relation Home Work Mobile    [name],[name] Significant other     [phone]         Other Contacts   None on File      Current Medical History  Patient Admitting Diagnosis: C6-T1 cervical myelopathy    History of Present Illness: A 57 y.o. male with a history of worsening bilateral lower extremity weakness and  sensory loss progressing over several months.  He had progressed to the point where patient was requiring a wheelchair for mobility.  Patient developed spasticity as well.  MRI of the cervical spine revealed a C6-T1 severe cervical stenosis with cord compression and myelopathy.  Patient presented on 06/05/2023 for C6-T1 ACDF by Dr. Jake Samples.  Patient with significant lower extremity spasms this morning.  Valium was added and baclofen was increased.  He remains in a cervical collar. Pt discharged to CIR  on 06/07/23 and transfered back to acute care on 06/13/23 due to tachycardia, fever, and suspected sepsis. Found to have bacteremia and acute cystitis. Pt With Klebsiella aerogenes, presumably from cystitis.  He completed a 7-day course of antibiotics on 06/19/2023.  PT/OT evaluations completed with recommendations for acute inpatient rehab admission.     Patient's medical record from [facility] has been reviewed by the rehabilitation admission coordinator and physician.   Past Medical History      Past Medical History:  Diagnosis Date   Arthritis     Hip pain     Sepsis (HCC) 06/13/2023        Has the  patient had major surgery during 100 days prior to admission? Yes   Family History   family history is not on file.   Current Medications  Current Medications    Current Facility-Administered Medications:    acetaminophen (TYLENOL) tablet 650 mg, 650 mg, Oral, Q6H, Annett Fabian, MD, 650 mg at 06/18/23 0830   alum & mag hydroxide-simeth (MAALOX/MYLANTA) 200-200-20 MG/5ML suspension 30 mL, 30 mL, Oral, Q4H PRN, Morene Crocker, MD   baclofen (LIORESAL) tablet 20 mg, 20 mg, Oral, Q6H, Morene Crocker, MD, 20 mg at 06/18/23 0830   bisacodyl (DULCOLAX) suppository 10 mg, 10 mg, Rectal, Daily PRN, Rocky Morel, DO   Chlorhexidine Gluconate Cloth 2 % PADS 6 each, 6 each, Topical, Daily, Tyson Alias, MD, 6 each at 06/18/23 1046   diazepam (VALIUM)  tablet 5 mg, 5 mg, Oral, Q6H, Morene Crocker, MD, 5 mg at 06/18/23 0830   enoxaparin (LOVENOX) injection 40 mg, 40 mg, Subcutaneous, Q24H, Morene Crocker, MD, 40 mg at 06/18/23 0829   guaiFENesin (MUCINEX) 12 hr tablet 600 mg, 600 mg, Oral, BID, Morene Crocker, MD, 600 mg at 06/18/23 0830   guaiFENesin-dextromethorphan (ROBITUSSIN DM) 100-10 MG/5ML syrup 10 mL, 10 mL, Oral, Q6H PRN, Morene Crocker, MD   levofloxacin (LEVAQUIN) tablet 750 mg, 750 mg, Oral, Daily, Jenita Seashore, RPH, 750 mg at 06/17/23 1801   lidocaine (LIDODERM) 5 % 1 patch, 1 patch, Transdermal, Q24H, Annett Fabian, MD, 1 patch at 06/18/23 1046   lidocaine (XYLOCAINE) 2 % jelly 1 Application, 1 Application, Urethral, Q6H PRN, Morene Crocker, MD, 1 Application at 06/14/23 0011   menthol-cetylpyridinium (CEPACOL) lozenge 3 mg, 1 lozenge, Oral, PRN, Morene Crocker, MD   morphine (MS CONTIN) 12 hr tablet 15 mg, 15 mg, Oral, BID, Morene Crocker, MD, 15 mg at 06/18/23 0830   ondansetron (ZOFRAN) tablet 4 mg, 4 mg, Oral, Q6H PRN **OR** ondansetron (ZOFRAN) injection 4 mg, 4 mg, Intravenous, Q6H PRN, Morene Crocker, MD   Oral care mouth rinse, 15 mL, Mouth Rinse, PRN, Morene Crocker, MD   oxyCODONE (Oxy IR/ROXICODONE) immediate release tablet 5 mg, 5 mg, Oral, Q4H PRN, Marrianne Mood, MD, 5 mg at 06/17/23 0228   polyethylene glycol (MIRALAX / GLYCOLAX) packet 17 g, 17 g, Oral, Daily, Marrianne Mood, MD, 17 g at 06/18/23 0829   [START ON 06/19/2023] senna-docusate (Senokot-S) tablet 2 tablet, 2 tablet, Oral, QHS, Rocky Morel, DO   tamsulosin [facility]) capsule 0.4 mg, 0.4 mg, Oral, QPC supper, Morene Crocker, MD, 0.4 mg at 06/17/23 1801   tiZANidine (ZANAFLEX) tablet 4 mg, 4 mg, Oral, Q6H PRN, Morene Crocker, MD, 4 mg at 06/17/23 2204     Patients Current Diet:  Diet Order                  Diet regular Room service appropriate? Yes;  Fluid consistency: Thin  Diet effective now                       Precautions / Restrictions Precautions Precautions: Fall, Cervical Precaution Booklet Issued: Yes (comment) Precaution Comments: hx of many falls, severe B LE hypertonia with muscle spasms, verbally went through cervical precautions throughout session Cervical Brace: Hard collar Restrictions Weight Bearing Restrictions Per Provider Order: No Other Position/Activity Restrictions: cervical precautions    Has the patient had 2 or more falls or a fall with injury in the past year? Yes   Prior Activity Level Household: went out for appointments onlyPt. Went out for appts only (  household) Self Care: Did the patient need help bathing, dressing, using the toilet or eating?  Needed some help   Indoor Mobility: Did the patient need assistance with walking from room to room (with or without device)? Needed some help   Stairs: Did the patient need assistance with internal or external stairs (with or without device)? Needed some help   Functional Cognition: Did the patient need help planning regular tasks such as shopping or remembering to take medications? Independent   Patient Information Are you of Hispanic, Latino/a,or Spanish origin?: A. No, not of Hispanic, Latino/a, or Spanish origin What is your race?: B. Black or African American Do you need or want an interpreter to communicate with a doctor or health care staff?: 0. No   Patient's Response To:  Health Literacy and Transportation Is the patient able to respond to health literacy and transportation needs?: Yes Health Literacy - How often do you need to have someone help you when you read instructions, pamphlets, or other written material from your doctor or pharmacy?: Never In the past 12 months, has lack of transportation kept you from medical appointments or from getting medications?: No In the past 12 months, has lack of transportation kept you from meetings,  work, or from getting things needed for daily living?: No   Journalist, newspaper / Equipment Home Equipment: Information systems manager, Higher education careers adviser held shower head, Agricultural consultant (2 wheels), [employer] - single point, Wheelchair - manual, Crutches, Toilet riser   Prior Device Use: Indicate devices/aids used by the patient prior to current illness, exacerbation or injury? Manual wheelchair and Walker   Current Functional Level Cognition   Overall Cognitive Status: Within Functional Limits for tasks assessed Orientation Level: Oriented X4 General Comments: able to fully participate    Extremity Assessment (includes Sensation/Coordination)   Upper Extremity Assessment: Right hand dominant, Generalized weakness  Lower Extremity Assessment: Defer to PT evaluation     ADLs   Overall ADL's : Needs assistance/impaired Eating/Feeding: Modified independent, Bed level Grooming: Minimal assistance, Bed level Upper Body Bathing: Maximal assistance, Bed level, Cueing for compensatory techniques (adhering to cervical precautions) Lower Body Bathing: Total assistance, +2 for physical assistance, +2 for safety/equipment, Bed level (adhering to cervical precautions) Upper Body Dressing : Maximal assistance, Sitting Lower Body Dressing: Total assistance, Bed level Lower Body Dressing Details (indicate cue type and reason): for donning/doffing socks Toilet Transfer Details (indicate cue type and reason): deferred this session for pt/therapist safety Toileting- Clothing Manipulation and Hygiene: Total assistance, +2 for safety/equipment, +2 for physical assistance, Bed level General ADL Comments: Pt bed level for most ADLs at this time, able to roll with min A for toileting hygiene in bed, L knee flexion spasms limit ability to turn and position in bed. Pt max A for UB dressing sitting EOB, difficulty maintaining balance while sitting to participate in ADLs     Mobility   Overal bed mobility: Needs Assistance Bed Mobility:  Rolling, Supine to Sit, Sit to Supine Rolling: Min assist, Used rails Sidelying to sit: Max assist, HOB elevated, Used rails Supine to sit: Max assist, +2 for physical assistance, +2 for safety/equipment, HOB elevated Sit to supine: Max assist, +2 for physical assistance, Used rails General bed mobility comments: max A for sitting EOB and lying back down, not able to assist with BLEs. Rolls with min A L/R     Transfers   Overall transfer level: Needs assistance Equipment used: Ambulation equipment used Transfers: Sit to/from Stand, Bed to chair/wheelchair/BSC Sit to  Stand: Max assist, +2 physical assistance, +2 safety/equipment, From elevated surface, Via lift equipment Bed to/from chair/wheelchair/BSC transfer type:: Via Lift equipment Transfer via Lift Equipment: Maximove General transfer comment: maximove for w/c to bed, not able to stand up to use [place] today during session after several attempts.     Ambulation / Gait / Stairs / Wheelchair Mobility   Ambulation/Gait General Gait Details: unable at this time. Wheelchair Mobility Wheelchair mobility: Yes Wheelchair propulsion: Both upper extremities Wheelchair parts: Independent Distance: 60 Wheelchair Assistance Details (indicate cue type and reason): pt able to self propel personal W/C in hall with BUE, distance limited by fatigue     Posture / Balance Dynamic Sitting Balance Sitting balance - Comments: poor sitting balance, difficulty placing BLEs in comfortable position to allow for proper sitting balance Balance Overall balance assessment: Needs assistance, History of Falls Sitting-balance support: Single extremity supported, Bilateral upper extremity supported, No upper extremity supported, Feet supported Sitting balance-Leahy Scale: Poor Sitting balance - Comments: poor sitting balance, difficulty placing BLEs in comfortable position to allow for proper sitting balance Postural control: Posterior lean Standing balance  support: Bilateral upper extremity supported, During functional activity, Reliant on assistive device for balance Standing balance-Leahy Scale: Zero Standing balance comment: was not able to stand with Stedy, does not clear bed high enough to use Stedy consistently, not able to maintain for more than 3-4 seconds after repeated attempts.     Special needs/care consideration      Previous Home Environment  Living Arrangements: Spouse/significant other  Lives With: Significant other Available Help at Discharge: Family, Available 24 hours/day Type of Home: House Home Layout: Two level, Bed/bath upstairs, 1/2 bath on main level Alternate Level Stairs-Number of Steps: flight Home Access: Stairs to enter Entrance Stairs-Rails: None Entrance Stairs-Number of Steps: 1 Bathroom Shower/Tub: Engineer, manufacturing systems: Standard Additional Comments: reports started using w/c in June 2024 - he uses the wheelchair downstairs for more mobility, goes downstairs 1x/day to smoke a cigar (but hasn't been downstairs in ~68month PTA), uses RW upstairs for very short distance gait to/from bathroom for BM otherwise uses urinal for bladder -- S.O. 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 position   Discharge Living Setting Plans for Discharge Living Setting: Patient's home, Lives with (comment) (spouse/significant other) Type of Home at Discharge: House Discharge Home Layout: Two level, Bed/bath upstairs, 1/2 bath on main level Alternate Level Stairs-Number of Steps: flight Discharge Home Access: Stairs to enter Entrance Stairs-Rails: None Entrance Stairs-Number of Steps: 1 Discharge Bathroom Shower/Tub: Tub/shower unit Discharge Bathroom Toilet: Standard Discharge Bathroom Accessibility: Yes How Accessible: Accessible via walker Does the patient have any problems obtaining your medications?: No   Social/Family/Support  Systems Patient Roles: Airline pilot Information: [name] Anticipated Caregiver: wife Anticipated Industrial/product designer Information: see contacts Ability/Limitations of Caregiver: wife to continue FMLA Caregiver Availability: 24/7 Discharge Plan Discussed with Primary Caregiver: Yes Is Caregiver In Agreement with Plan?: Yes Does Caregiver/Family have Issues with Lodging/Transportation while Pt is in Rehab?: No   Social/Family/Support Systems Patient Roles: Partner (Has SO, sister, brother) Solicitor Information: [name] Laural Benes - SO [phone] Anticipated Caregiver: SO Ability/Limitations of Caregiver: [name] plans to return to work in February 2025. Caregiver Availability: Intermittent Discharge Plan Discussed with Primary Caregiver: Yes Is Caregiver In Agreement with Plan?: Yes Does Caregiver/Family have Issues with Lodging/Transportation while Pt is in Rehab?: No   Goals Patient/Family Goal  for Rehab: superivsion to min assist aith PT and OT Expected length of stay: ELOS 3 to 4 weeks Pt/Family Agrees to Admission and willing to participate: Yes Program Orientation Provided & Reviewed with Pt/Caregiver Including Roles  & Responsibilities: YesPatient/Family Goal for Rehab: PT/OT mod I and supervision goals Expected length of stay: 18-24 days Pt/Family Agrees to Admission and willing to participate: Yes Program Orientation Provided & Reviewed with Pt/Caregiver Including Roles  & Responsibilities: Yes   Decrease burden of Care through IP rehab admission: Not anticipated   Possible need for SNF placement upon discharge: not anticipated   Patient Condition: I have reviewed medical records from [facility] [place] , spoken with CM, and patient and spouse. I met with patient at the bedside for inpatient rehabilitation assessment.  Patient will benefit from ongoing PT, OT, and SLP, can actively participate in 3 hours of therapy a day 5 days of the week, and can make measurable  gains during the admission.  Patient will also benefit from the coordinated team approach during an Inpatient Acute Rehabilitation admission.  The patient will receive intensive therapy as well as Rehabilitation physician, nursing, social worker, and care management interventions.  Due to bladder management, bowel management, safety, skin/wound care, disease management, medication administration, pain management, and patient education the patient requires 24 hour a day rehabilitation nursing.  The patient is currently max assist overall with mobility and basic ADLs.  Discharge setting and therapy post discharge at home with home health is anticipated.  Patient has agreed to participate in the Acute Inpatient Rehabilitation Program and will admit today.   Preadmission Screen Completed [id number] Staley with updates by  Clois Dupes, 06/18/2023 10:51 AM ______________________________________________________________________   Discussed status with Dr. Berline Chough on 06/18/23 at 1050 and received approval for admission today.   Admission Coordinator:  Megan Salon with updates by Clois Dupes, RN, time 1050 Date 06/18/23    Assessment/Plan: Diagnosis: cervical myelopathy with severe spasticity Does the need for close, 24 hr/day Medical supervision in concert with the patient's rehab needs make it unreasonable for this patient to be served in a less intensive setting? Yes Co-Morbidities requiring supervision/potential complications: Recetn sepsis due to bacteremia; spasticity, pain; not ocntrolled- and weakness due to severe cervicalmyelopathy- neurogenic bowel and bladder Due to bladder management, bowel management, safety, skin/wound care, disease management, medication administration, pain management, and patient education, does the patient require 24 hr/day rehab nursing? Yes Does the patient require coordinated care of a physician, rehab nurse, PT, OT, and SLP to address physical and  functional deficits in the context of the above medical diagnosis(es)? Yes Addressing deficits in the following areas: balance, endurance, locomotion, strength, transferring, bowel/bladder control, bathing, dressing, feeding, grooming, and toileting Can the patient actively participate in an intensive therapy program of at least 3 hrs of therapy 5 days a week? Yes The potential for patient to make measurable gains while on inpatient rehab is good Anticipated functional outcomes upon discharge from inpatient rehab: supervision PT, supervision OT, n/a SLP Estimated rehab length of stay to reach the above functional goals is: 3-4 weeks Anticipated discharge destination: Home 10. Overall Rehab/Functional Prognosis: good     MD Signature:            Revision History

## 2023-06-18 NOTE — H&P (Signed)
Physical Medicine and Rehabilitation Admission H&P     CC: Functional deficits due to cervical myelopathy.     HPI:  Leon Rodriguez is a 57 year old male with history of progressive BLE weakness with numbness, difficulty walking with multiple falls, bowel incontinence and spasticity for months. 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  He underwent ACDF C6-T1 by Dr. Jake Samples on 08/05/2022 and postop continued to have significant weakness with lower extremity spasms.  Therapy evaluations revealed functional decline and CIR was recommended for follow-up therapy.  He was noted to have significant lower extremity spasticity with tone and baclofen was titrated upward in addition to Zanaflex and Valium.  He was started on bowel and bladder program due to issues with neurogenic bowel and bladder.  He required In-N-Out caths and developed hematuria due to traumatic catheterization.  Lovenox was held briefly and Flomax added to help with voiding function.    Hospital course significant for fever with sinus tach secondary to sepsis on 06/12/2023.  He was pancultured, started on IV fluids and transferred to acute hospital for management.  He was found to have Klebsiella erogenous bacteremia secondary to UTI's/cystitis.  He was started on cefepime and transition to Levaquin through 12/25 to complete 7-day course antibiotic regimen. Fevers have resolved with intermittent low blood pressures. He reported issues with left knee pain and stiffness felt to be due to spasticity. Therapy has continued to work with  patient who continues to require +2 max assist with lift for tranfers and CIR recommended due to functional deficits. He was cleared tor resume intensive rehab program.       Pt doesn't want suppository- wants to do enema- has been refusing suppository-  Pt said LBM yesterday.  Staff placed Foley while on acute.  Pt agreed foley to come out.     Denied pain when I was in room and said spasms not bad.  Frustrated about band of at level SCI pain.  Band of corset feeling.    Review of Systems  Constitutional:  Positive for malaise/fatigue. Negative for chills and fever.  HENT:  Negative for congestion, hearing loss and tinnitus.   Eyes:  Negative for blurred vision and double vision.  Respiratory:  Negative for cough and shortness of breath.   Cardiovascular:  Negative for chest pain, palpitations and leg swelling.  Gastrointestinal:  Negative for abdominal pain, heartburn and nausea.       Doesn't have control of bowel  Genitourinary:  Negative for dysuria and urgency.       Cannot void on own  Skin:  Negative for rash.  Neurological:  Positive for tingling (tingling in arms has stopped today but continues in BLE), sensory change, focal weakness and weakness. Negative for dizziness and headaches.  Psychiatric/Behavioral:  Positive for depression.   All other systems reviewed and are negative.           Past Medical History:  Diagnosis Date   Arthritis     Hip pain     Sepsis (HCC) 06/13/2023               Past Surgical History:  Procedure Laterality Date   ANTERIOR CERVICAL DECOMP/DISCECTOMY FUSION N/A 06/05/2023    Procedure: ANTERIOR CERVICAL DISCECTOMY AND FUSION CERVICAL SIX-SEVEN/CERVICAL SEVEN-THORACIC ONE;  Surgeon: Bethann Goo, DO;  Location: [facility] OR;  Service: Neurosurgery;  Laterality: N/A;  3C   APPENDECTOMY  CIRCUMCISION       HIATAL HERNIA REPAIR       HIP SURGERY        at 52 and 57 yo          History reviewed. No pertinent family history.         Social History:  reports that he has been smoking cigarettes and cigars. He has never used smokeless tobacco. He reports that he does not currently use alcohol. He reports current drug use. Drug: Marijuana.     Allergies:  Allergies  No Known Allergies             Medications Prior to Admission  Medication Sig Dispense Refill    acetaminophen (TYLENOL) 500 MG tablet Take 2 tablets (1,000 mg total) by mouth every 8 (eight) hours as needed for fever. (Patient not taking: Reported on 06/03/2023) 30 tablet 0   Baclofen 5 MG TABS Take 1 tablet by mouth 3 (three) times daily as needed (muscle spasms).       famotidine (ZANTAC 360) 10 MG tablet Take 10 mg by mouth daily as needed for heartburn or indigestion.                  Home: Home Living Family/patient expects to be discharged to:: Inpatient rehab Living Arrangements: Spouse/significant other Available Help at Discharge: Family, Available 24 hours/day Type of Home: House Home Access: Stairs to enter [employer] of Steps: 1 Entrance Stairs-Rails: None Home Layout: Two level, Bed/bath upstairs, 1/2 bath on main level Alternate Level Stairs-Number of Steps: flight Bathroom Shower/Tub: Engineer, manufacturing systems: Standard Home Equipment: Information systems manager, Higher education careers adviser held shower head, Agricultural consultant (2 wheels), [employer] - single point, Wheelchair - manual, Crutches, Toilet riser Additional Comments: reports started using w/c in June 2024 - he uses the wheelchair downstairs for more mobility, goes downstairs 1x/day to smoke a cigar (but hasn't been downstairs in ~30month PTA), uses RW upstairs for very short distance gait to/from bathroom for BM otherwise uses urinal for bladder -- S.O. 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 Pt had been getting in and out of tub but has had a fall there. Per CIR notes just prior to this admission, pt completed ADLs with Min to Total assist.   Functional Status:  Mobility: Bed Mobility Overal  bed mobility: Needs Assistance Bed Mobility: Rolling, Supine to Sit, Sit to Supine Rolling: Min assist, Used rails Sidelying to sit: Max assist, HOB elevated, Used rails Supine to sit: Max assist, +2 for physical assistance, +2 for safety/equipment, HOB elevated Sit to supine: Max assist, +2 for physical assistance, Used rails General bed mobility comments: max A for sitting EOB and lying back down, not able to assist with BLEs. Rolls with min A L/R Transfers Overall transfer level: Needs assistance Equipment used: Ambulation equipment used Transfers: Sit to/from Stand, Bed to chair/wheelchair/BSC Sit to Stand: Max assist, +2 physical assistance, +2 safety/equipment, From elevated surface, Via lift equipment Bed to/from chair/wheelchair/BSC transfer type:: Via Financial planner via Lift Equipment: Maximove General transfer comment: maximove for w/c to bed, not able to stand up to use [place] today  during session after several attempts. Ambulation/Gait General Gait Details: unable at this time. Wheelchair Mobility Wheelchair mobility: Yes Wheelchair propulsion: Both upper extremities Wheelchair parts: Independent Distance: 60 Wheelchair Assistance Details (indicate cue type and reason): pt able to self propel personal W/C in hall with BUE, distance limited by fatigue   ADL: ADL Overall ADL's : Needs assistance/impaired Eating/Feeding: Modified independent, Bed level Grooming: Minimal assistance, Bed level Upper Body Bathing: Maximal assistance, Bed level, Cueing for compensatory techniques (adhering to cervical precautions) Lower Body Bathing: Total assistance, +2 for physical assistance, +2 for safety/equipment, Bed level (adhering to cervical precautions) Upper Body Dressing : Maximal assistance, Sitting Lower Body Dressing: Total assistance, Bed level Lower Body Dressing Details (indicate cue type and reason): for donning/doffing socks Toilet Transfer Details (indicate cue  type and reason): deferred this session for pt/therapist safety Toileting- Clothing Manipulation and Hygiene: Total assistance, +2 for safety/equipment, +2 for physical assistance, Bed level General ADL Comments: Pt bed level for most ADLs at this time, able to roll with min A for toileting hygiene in bed, L knee flexion spasms limit ability to turn and position in bed. Pt max A for UB dressing sitting EOB, difficulty maintaining balance while sitting to participate in ADLs   Cognition: Cognition Overall Cognitive Status: Within Functional Limits for tasks assessed Orientation Level: Oriented X4 Cognition Arousal: Alert Behavior During Therapy: WFL for tasks assessed/performed Overall Cognitive Status: Within Functional Limits for tasks assessed General Comments: able to fully participate   Physical Exam: Blood pressure 103/60, pulse 99, temperature 98.2 F (36.8 C), temperature source Oral, resp. rate 15, height 5\' 9"  (1.753 m), weight 99.8 kg, SpO2 99%. Physical Exam Vitals and nursing note reviewed.  Constitutional:      General: He is not in acute distress.    Appearance: He is obese.     Comments: Awake, but sleepy- just woke up- pretty alert; NAD  HENT:     Head: Normocephalic and atraumatic.     Right Ear: External ear normal.     Left Ear: External ear normal.     Nose: Nose normal. No congestion.     Mouth/Throat:     Mouth: Mucous membranes are dry.     Pharynx: Oropharynx is clear. No oropharyngeal exudate.  Eyes:     General:        Right eye: No discharge.        Left eye: No discharge.     Extraocular Movements: Extraocular movements intact.  Cardiovascular:     Rate and Rhythm: Normal rate and regular rhythm.     Heart sounds: Normal heart sounds. No murmur heard.    No gallop.  Pulmonary:     Effort: Pulmonary effort is normal. No respiratory distress.     Breath sounds: Normal breath sounds. No wheezing, rhonchi or rales.  Abdominal:     General: There is  no distension.     Tenderness: There is no abdominal tenderness.     Comments: Normoactive BS; slightly protuberant  Musculoskeletal:     Cervical back: Neck supple. No tenderness.     Comments: Ue's 5-/5 throughout on exam B/L RLE- HF 2-/5; KE 2-/5; DF 4-/5; and PF 4-/5 LLE- HF 2-/5; KE 2-/5; DF 2/5; PF 2/5- limited b/l by spasticity  Skin:    General: Skin is warm and dry.     Comments: Incision looks OK   Neurological:     Comments: Sensory level at T4 and below- MAS of 2 of LE's  and extensor tone severe- many spasms seen with ROM and 5-6 beats clonus B/L LE's- but is better than was last time saw pt  Psychiatric:     Comments: Depressed; anxious, and sleepy        Lab Results Last 48 Hours        Results for orders placed or performed during the hospital encounter of 06/13/23 (from the past 48 hours)  CBC     Status: Abnormal    Collection Time: 06/17/23  2:58 AM  Result Value Ref Range    WBC 7.6 4.0 - 10.5 K/uL    RBC 3.07 (L) 4.22 - 5.81 MIL/uL    Hemoglobin 10.3 (L) 13.0 - 17.0 g/dL    HCT 16.1 (L) 09.6 - 52.0 %    MCV 96.1 80.0 - 100.0 fL    MCH 33.6 26.0 - 34.0 pg    MCHC 34.9 30.0 - 36.0 g/dL    RDW 04.5 40.9 - 81.1 %    Platelets 346 150 - 400 K/uL    nRBC 0.0 0.0 - 0.2 %      Comment: Performed at [facility], [address] [place], [place] [address]  Basic metabolic panel     Status: Abnormal    Collection Time: 06/17/23  2:58 AM  Result Value Ref Range    Sodium 136 135 - 145 mmol/L    Potassium 3.7 3.5 - 5.1 mmol/L    Chloride 103 98 - 111 mmol/L    CO2 26 22 - 32 mmol/L    Glucose, Bld 109 (H) 70 - 99 mg/dL      Comment: Glucose reference range applies only to samples taken after fasting for at least 8 hours.    BUN 8 6 - 20 mg/dL    Creatinine, Ser 2.95 0.61 - 1.24 mg/dL    Calcium 9.2 8.9 - 62.1 mg/dL    GFR, Estimated >30 >86 mL/min      Comment: (NOTE) Calculated using the CKD-EPI Creatinine Equation (2021)      Anion gap 7 5 - 15       Comment: Performed at [facility], [address] [place], [place] [address]  CBC     Status: Abnormal    Collection Time: 06/18/23  3:07 AM  Result Value Ref Range    WBC 8.1 4.0 - 10.5 K/uL    RBC 3.09 (L) 4.22 - 5.81 MIL/uL    Hemoglobin 10.4 (L) 13.0 - 17.0 g/dL    HCT 96.2 (L) 95.2 - 52.0 %    MCV 96.4 80.0 - 100.0 fL    MCH 33.7 26.0 - 34.0 pg    MCHC 34.9 30.0 - 36.0 g/dL    RDW 84.1 32.4 - 40.1 %    Platelets 373 150 - 400 K/uL    nRBC 0.0 0.0 - 0.2 %      Comment: Performed at [facility], [address] [place], [place] [address]  Basic metabolic panel     Status: Abnormal    Collection Time: 06/18/23  3:07 AM  Result Value Ref Range    Sodium 135 135 - 145 mmol/L    Potassium 4.1 3.5 - 5.1 mmol/L    Chloride 101 98 - 111 mmol/L    CO2 26 22 - 32 mmol/L    Glucose, Bld 110 (H) 70 - 99 mg/dL      Comment: Glucose reference range applies only to samples taken after fasting for  at least 8 hours.    BUN 9 6 - 20 mg/dL    Creatinine, Ser 5.78 0.61 - 1.24 mg/dL    Calcium 9.4 8.9 - 46.9 mg/dL    GFR, Estimated >62 >95 mL/min      Comment: (NOTE) Calculated using the CKD-EPI Creatinine Equation (2021)      Anion gap 8 5 - 15      Comment: Performed at [facility], [address], [place], [place] [address]  Glucose, capillary     Status: Abnormal    Collection Time: 06/18/23  7:57 AM  Result Value Ref Range    Glucose-Capillary 105 (H) 70 - 99 mg/dL      Comment: Glucose reference range applies only to samples taken after fasting for at least 8 hours.      Imaging Results (Last 48 hours)  No results found.         Blood pressure 103/60, pulse 99, temperature 98.2 F (36.8 C), temperature source Oral, resp. rate 15, height 5\' 9"  (1.753 m), weight 99.8 kg, SpO2 99%.   Medical Problem List and Plan: 1. Functional deficits secondary to Cervical myelopathy s/p ACDF- back after sepsis             -patient may  shower             -ELOS/Goals:  3-4 weeks- min A hopefully Admit to CIR 2.  Antithrombotics: -DVT/anticoagulation:  Pharmaceutical: Lovenox             -antiplatelet therapy: N/A 3. Pain Management:  MS contin 15 mg bid, Oxycodone prn. Lidoderm patch for local measures. Tylenol 650 mg qid.             --bilateral hip pain with attempts at rolling.              --on Baclofen 20 mg qid, Valium 5 mg qid with Zanaflex 4 mg qid prn for spasticity.  Starting Duloxetine 20 mg at bedtime- titrate up every 3-4 days as tolerated 4. Mood/Behavior/Sleep: LCSW to follow for evaluation and support.              -antipsychotic agents: N/A 5. Neuropsych/cognition: This patient is capable of making decisions on his own behalf. 6. Skin/Wound Care: Routine pressure relief measures.  7. Fluids/Electrolytes/Nutrition: Monitor I/O. Check CMET in am 8. Klebsiella aerogenes bacteremia/sepsis /UTI: On Levaquin D #6/7 9. Neurogenic bowel: Continue Senna S 2 tabs BID. Miralax daily-->has been incontinent of bowel and question due to antibiotics and/or laxatives --Suppository daily after supper-->has refused for past 3 nights.  --would prefer enema three times a week as at home. Strongly suggested using mini enema 10. Neurogenic bladder: Foley was replaced on acute-->will d/c and start bladder program. (patient would like to resume) --Monitor voiding with PVR checks             --use coude cath and monitor for recurrent hematuria             --Continue flomax 11. 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  Additionally, I have personally reviewed laboratory data, imaging studies, as well as relevant notes and concur with the physician assistant's documentation above.   The patient's status has  not changed from the original H&P.  Any  changes in documentation from the acute care chart have been noted above.

## 2023-06-18 NOTE — Plan of Care (Signed)
  Problem: Clinical Measurements: Goal: Diagnostic test results will improve Outcome: Progressing   Problem: Clinical Measurements: Goal: Will remain free from infection Outcome: Progressing   Problem: Clinical Measurements: Goal: Ability to maintain clinical measurements within normal limits will improve Outcome: Progressing   Problem: Coping: Goal: Level of anxiety will decrease Outcome: Progressing   Problem: Nutrition: Goal: Adequate nutrition will be maintained Outcome: Progressing   Problem: Activity: Goal: Risk for activity intolerance will decrease Outcome: Progressing

## 2023-06-18 NOTE — Discharge Summary (Addendum)
Name: Leon Rodriguez MRN: [id number] DOB: 1966/02/11 57 y.o. PCP: Kathleen Lime, MD  Date of Admission: 06/13/2023 10:41 PM Date of Discharge: 06/18/23  Attending Physician: Dr. Cleda Daub  Discharge Diagnosis: Principal Problem:   Cervical myelopathy [facility]) Active Problems:   Neurogenic bladder   Lumbar canal stenosis   Lower extremity weakness   Acute cystitis without hematuria   Bacteremia  Sepsis  Discharge Medications: Allergies as of 06/18/2023   No Known Allergies      Medication List     TAKE these medications    acetaminophen 325 MG tablet Commonly known as: TYLENOL Take 2 tablets (650 mg total) by mouth every 6 (six) hours. What changed:  medication strength how much to take when to take this reasons to take this   alum & mag hydroxide-simeth 200-200-20 MG/5ML suspension Commonly known as: MAALOX/MYLANTA Take 30 mLs by mouth every 4 (four) hours as needed for indigestion.   baclofen 20 MG tablet Commonly known as: LIORESAL Take 1 tablet (20 mg total) by mouth every 6 (six) hours. What changed:  medication strength how much to take when to take this reasons to take this   bisacodyl 10 MG suppository Commonly known as: DULCOLAX Place 1 suppository (10 mg total) rectally daily as needed for moderate constipation.   diazepam 5 MG tablet Commonly known as: VALIUM Take 1 tablet (5 mg total) by mouth every 6 (six) hours.   guaiFENesin 600 MG 12 hr tablet Commonly known as: MUCINEX Take 1 tablet (600 mg total) by mouth 2 (two) times daily.   guaiFENesin-dextromethorphan 100-10 MG/5ML syrup Commonly known as: ROBITUSSIN DM Take 10 mLs by mouth every 6 (six) hours as needed for cough.   levofloxacin 750 MG tablet Commonly known as: LEVAQUIN Take 1 tablet (750 mg total) by mouth daily for 1 day.   lidocaine 2 % jelly Commonly known as: XYLOCAINE Place 1 Application into the urethra every 6 (six) hours as needed (for cathing).   lidocaine  5 % Commonly known as: LIDODERM Place 1 patch onto the skin daily. Remove & Discard patch within 12 hours or as directed by MD   menthol-cetylpyridinium 3 MG lozenge Commonly known as: CEPACOL Take 1 lozenge (3 mg total) by mouth as needed for sore throat.   morphine 15 MG 12 hr tablet Commonly known as: MS CONTIN Take 1 tablet (15 mg total) by mouth 2 (two) times daily.   ondansetron 4 MG tablet Commonly known as: ZOFRAN Take 1 tablet (4 mg total) by mouth every 6 (six) hours as needed for nausea or vomiting.   oxyCODONE 5 MG immediate release tablet Commonly known as: Oxy IR/ROXICODONE Take 1 tablet (5 mg total) by mouth every 4 (four) hours as needed for breakthrough pain.   polyethylene glycol 17 g packet Commonly known as: MIRALAX / GLYCOLAX Take 17 g by mouth daily.   senna-docusate 8.6-50 MG tablet Commonly known as: Senokot-S Take 2 tablets by mouth at bedtime. Start taking on: June 19, 2023   tamsulosin 0.4 MG Caps capsule Commonly known as: FLOMAX Take 1 capsule (0.4 mg total) by mouth daily after supper.   tiZANidine 4 MG tablet Commonly known as: ZANAFLEX Take 1 tablet (4 mg total) by mouth every 6 (six) hours as needed for muscle spasms.   Zantac 360 10 MG tablet Generic drug: famotidine Take 10 mg by mouth daily as needed for heartburn or indigestion.        Disposition and follow-up:   Mr.Leon  B Rodriguez was discharged from [facility] in Stable condition.  At the hospital follow up visit please address:  1.  Follow-up:  - Bacteremia: ensure patient is not exhibiting further signs of sepsis  - Cervical myelopathy: check if pain is controlled, whether current doses of baclofen and oxycodone are sufficient, should have neurosurgery follow up  4.  Medication Changes  Abx - Levofloxacin 750mg   End Date: 06/19/23  Follow-up Appointments: TBD at [facility] Course by problem list:    #Sepsis due to Klebsiella  bacteremia #Acute cystitis IMTS consulted for sepsis rule out on 12/13. Patient was in CIR after his anterior cervical and discectomy and fusion. Found to have UA with pyruia and WBC clumbs, urine cultures growing Klebsiella Aerogenes. Blood cultures from 12/18 also found to be growing Klebsiella. Patient was initially started on vanc and cefepime, narrowed to levofloxacin 750mg  daily to complete a 7 day course, which will be completed 06/19/23. Repeat blood cultures from 12/19 show no growth to date. Sepsis resolved after starting antibiotics, and patient was medically cleared to be transferred back to acute inpatient rehab.   #Cervical myelopathy #Lumbar back spasms Hx of cervical spinal stenosis with compression s/p anterior cervical discectomy and fusion C6-T1 on 12/11 complicated by new neurogenic bladder and LE spasticity. 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 Muscle spasms were managed with baclofen and tizanidine.   #Neurogenic bladder Patient developed neurogenic bladder as a result of his cervical discectomy and fusion. He had a foley placed with good output. Catheter remained in place throughout this hospital admission.    Discharge Subjective: Patient feeling similar today. He reports a squeezing sensation around his abdomen which has been bothering him on and off since  he was admitted. He reports regular bowel movements. Patient feels ready to return to CIR when coordinator approves him.   Discharge Exam:   BP 103/60 (BP Location: Left Arm)   Pulse 99   Temp 98.2 F (36.8 C) (Oral)   Resp 15   Ht 5\' 9"  (1.753 m)   Wt 99.8 kg   SpO2 99%   BMI 32.49 kg/m  Constitutional: in no acute distress HENT: normocephalic atraumatic, mucous membranes moist Eyes: conjunctiva non-erythematous Neck: supple Cardiovascular: regular rate and rhythm, no  m/r/g Pulmonary/Chest: normal work of breathing on room air, lungs clear to auscultation bilaterally Abdominal: mildly distended, mildly tender to deep palpation MSK: Left knee stiff with decreased flexion at hip and knee Neurological: alert & oriented x 3 Skin: warm and dry  Pertinent Labs, Studies, and Procedures:     Latest Ref Rng & Units 06/18/2023    3:07 AM 06/17/2023    2:58 AM 06/16/2023    3:16 AM  CBC  WBC 4.0 - 10.5 K/uL 8.1  7.6  7.6   Hemoglobin 13.0 - 17.0 g/dL 16.1  09.6  04.5   Hematocrit 39.0 - 52.0 % 29.8  29.5  30.3   Platelets 150 - 400 K/uL 373  346  339        Latest Ref Rng & Units 06/18/2023    3:07 AM 06/17/2023    2:58 AM 06/14/2023    3:41 AM  CMP  Glucose 70 - 99 mg/dL 409  811  914   BUN 6 - 20 mg/dL 9  8  8    Creatinine 0.61 - 1.24 mg/dL 7.82  0.74  0.69   Sodium 135 - 145 mmol/L 135  136  136   Potassium 3.5 - 5.1 mmol/L 4.1  3.7  3.9   Chloride 98 - 111 mmol/L 101  103  107   CO2 22 - 32 mmol/L 26  26  22    Calcium 8.9 - 10.3 mg/dL 9.4  9.2  8.7     CT Angio Chest Pulmonary Embolism (PE) W or WO Contrast Result Date: 06/13/2023 CLINICAL DATA:  Positive D-dimer EXAM: CT ANGIOGRAPHY CHEST WITH CONTRAST TECHNIQUE: Multidetector CT imaging of the chest was performed using the standard protocol during bolus administration of intravenous contrast. Multiplanar CT image reconstructions and MIPs were obtained to evaluate the vascular anatomy. RADIATION DOSE REDUCTION: This exam was performed according to the departmental dose-optimization program which includes automated exposure control, adjustment of the mA and/or kV according to patient size and/or use of iterative reconstruction technique. CONTRAST:  75mL OMNIPAQUE IOHEXOL 350 MG/ML SOLN COMPARISON:  Cervical spine CT 06/11/2023. FINDINGS: Cardiovascular: Satisfactory opacification of the pulmonary arteries to the segmental level. No evidence of pulmonary embolism. Normal heart size. No pericardial  effusion. Mediastinum/Nodes: No enlarged mediastinal, hilar, or axillary lymph nodes. Thyroid gland, trachea, and esophagus demonstrate no significant findings. Lungs/Pleura: There are ill-defined ground-glass nodular densities in the inferior right lower lobe. There are patchy ground-glass opacities in both lower lobes. There is no pleural effusion or pneumothorax. Upper Abdomen: No acute abnormality. Musculoskeletal: Anterior fusion hardware is seen at C6 and C7. There is prevertebral swelling and edema at this level measuring up to 2 cm. There is also subcutaneous edema in the anterior mid neck with overlying skin staples. No discrete fluid collection or soft tissue gas identified. Review of the MIP images confirms the above findings. IMPRESSION: 1. No evidence for pulmonary embolism. 2. Ill-defined ground-glass nodular densities in the inferior right lower lobe and patchy ground-glass opacities in both lower lobes, likely infectious/inflammatory. 3. Anterior fusion hardware at C6 and C7 with prevertebral swelling and edema measuring up to 2 cm. Findings may be postsurgical in nature. 4. Subcutaneous edema in the anterior mid neck with overlying skin staples. No discrete fluid collection or soft tissue gas identified. Electronically Signed   By: Darliss Cheney M.D.   On: 06/13/2023 02:09   Signed: Monna Fam, MD PGY-1 06/18/2023, 10:48 AM   Pager: [phone]

## 2023-06-18 NOTE — H&P (Signed)
Physical Medicine and Rehabilitation Admission H&P    CC: Functional deficits.   HPI:  Leon Rodriguez is a 57 year old male with history of progressive BLE weakness with numbness, difficulty walking with multiple falls, bowel incontinence and spasticity for months. 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  He underwent ACDF C6-T1 by Dr. Jake Samples on 08/05/2022 and postop continued to have significant weakness with lower extremity spasms.  Therapy evaluations revealed functional decline and CIR was recommended for follow-up therapy.  He was noted to have significant lower extremity spasticity with tone and baclofen was titrated upward in addition to Zanaflex and Valium.  He was started on bowel and bladder program due to issues with neurogenic bowel and bladder.  He required In-N-Out caths and developed hematuria due to traumatic catheterization.  Lovenox was held briefly and Flomax added to help with voiding function.   Hospital course significant for fever with sinus tach secondary to sepsis on 06/12/2023.  He was pancultured, started on IV fluids and transferred to acute hospital for management.  He was found to have Klebsiella erogenous bacteremia secondary to UTI's/cystitis.  He was started on cefepime and transition to Levaquin through 12/25 to complete 7-day course antibiotic regimen. Fevers have resolved with intermittent low blood pressures. He reported issues with left knee pain and stiffness felt to be due to spasticity. Therapy has continued to work with  patient who continues to require +2 max assist with lift for tranfers and CIR recommended due to functional deficits. He was cleared tor resume intensive rehab program.     Pt doesn't want suppository- wants to do enema- has been refusing suppository-  Pt said LBM yesterday.  Staff placed Foley while on acute.  Pt agreed foley to come out.   Denied pain when I was in room and said  spasms not bad.  Frustrated about band of at level SCI pain.  Band of corset feeling.   Review of Systems  Constitutional:  Positive for malaise/fatigue. Negative for chills and fever.  HENT:  Negative for congestion, hearing loss and tinnitus.   Eyes:  Negative for blurred vision and double vision.  Respiratory:  Negative for cough and shortness of breath.   Cardiovascular:  Negative for chest pain, palpitations and leg swelling.  Gastrointestinal:  Negative for abdominal pain, heartburn and nausea.       Doesn't have control of bowel  Genitourinary:  Negative for dysuria and urgency.       Cannot void on own  Skin:  Negative for rash.  Neurological:  Positive for tingling (tingling in arms has stopped today but continues in BLE), sensory change, focal weakness and weakness. Negative for dizziness and headaches.  Psychiatric/Behavioral:  Positive for depression.   All other systems reviewed and are negative.    Past Medical History:  Diagnosis Date   Arthritis    Hip pain    Sepsis (HCC) 06/13/2023    Past Surgical History:  Procedure Laterality Date   ANTERIOR CERVICAL DECOMP/DISCECTOMY FUSION N/A 06/05/2023   Procedure: ANTERIOR CERVICAL DISCECTOMY AND FUSION CERVICAL SIX-SEVEN/CERVICAL SEVEN-THORACIC ONE;  Surgeon: Bethann Goo, DO;  Location: [facility] OR;  Service: Neurosurgery;  Laterality: N/A;  3C   APPENDECTOMY     CIRCUMCISION     HIATAL HERNIA REPAIR     HIP SURGERY     at 59 and 57 yo    History reviewed. No pertinent family history.   Social History:  reports that he has been smoking cigarettes and cigars. He has never used smokeless tobacco. He reports that he does not currently use alcohol. He reports current drug use. Drug: Marijuana.   Allergies: No Known Allergies   Medications Prior to Admission  Medication Sig Dispense Refill   acetaminophen (TYLENOL) 500 MG tablet Take 2 tablets (1,000 mg total) by mouth every 8 (eight) hours as needed for fever.  (Patient not taking: Reported on 06/03/2023) 30 tablet 0   Baclofen 5 MG TABS Take 1 tablet by mouth 3 (three) times daily as needed (muscle spasms).     famotidine (ZANTAC 360) 10 MG tablet Take 10 mg by mouth daily as needed for heartburn or indigestion.        Home: Home Living Family/patient expects to be discharged to:: Inpatient rehab Living Arrangements: Spouse/significant other Available Help at Discharge: Family, Available 24 hours/day Type of Home: House Home Access: Stairs to enter [employer] of Steps: 1 Entrance Stairs-Rails: None Home Layout: Two level, Bed/bath upstairs, 1/2 bath on main level Alternate Level Stairs-Number of Steps: flight Bathroom Shower/Tub: Engineer, manufacturing systems: Standard Home Equipment: Information systems manager, Higher education careers adviser held shower head, Agricultural consultant (2 wheels), [employer] - single point, Wheelchair - manual, Crutches, Toilet riser Additional Comments: reports started using w/c in June 2024 - he uses the wheelchair downstairs for more mobility, goes downstairs 1x/day to smoke a cigar (but hasn't been downstairs in ~59month PTA), uses RW upstairs for very short distance gait to/from bathroom for BM otherwise uses urinal for bladder -- S.O. 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 Pt had been getting in and out of tub but has had a fall there. Per CIR notes just prior to this admission, pt completed ADLs with Min to Total assist.  Functional Status:  Mobility: Bed Mobility Overal bed mobility: Needs Assistance Bed Mobility: Rolling, Supine to Sit, Sit to Supine Rolling: Min assist, Used rails Sidelying to sit:  Max assist, HOB elevated, Used rails Supine to sit: Max assist, +2 for physical assistance, +2 for safety/equipment, HOB elevated Sit to supine: Max assist, +2 for physical assistance, Used rails General bed mobility comments: max A for sitting EOB and lying back down, not able to assist with BLEs. Rolls with min A L/R Transfers Overall transfer level: Needs assistance Equipment used: Ambulation equipment used Transfers: Sit to/from Stand, Bed to chair/wheelchair/BSC Sit to Stand: Max assist, +2 physical assistance, +2 safety/equipment, From elevated surface, Via lift equipment Bed to/from chair/wheelchair/BSC transfer type:: Via Financial planner via Lift Equipment: Maximove General transfer comment: maximove for w/c to bed, not able to stand up to use [place] today during session after several attempts. Ambulation/Gait General Gait Details: unable at this time. Wheelchair Mobility Wheelchair mobility: Yes Wheelchair propulsion: Both upper extremities Wheelchair parts: Independent Distance: 60 Wheelchair Assistance Details (indicate cue type and reason): pt able to self propel personal W/C in hall with BUE, distance limited by fatigue  ADL: ADL Overall ADL's : Needs assistance/impaired Eating/Feeding: Modified independent, Bed level Grooming: Minimal assistance, Bed level Upper Body Bathing: Maximal assistance, Bed level, Cueing for compensatory techniques (adhering to cervical precautions)  Lower Body Bathing: Total assistance, +2 for physical assistance, +2 for safety/equipment, Bed level (adhering to cervical precautions) Upper Body Dressing : Maximal assistance, Sitting Lower Body Dressing: Total assistance, Bed level Lower Body Dressing Details (indicate cue type and reason): for donning/doffing socks Toilet Transfer Details (indicate cue type and reason): deferred this session for pt/therapist safety Toileting- Clothing Manipulation and Hygiene: Total assistance, +2 for  safety/equipment, +2 for physical assistance, Bed level General ADL Comments: Pt bed level for most ADLs at this time, able to roll with min A for toileting hygiene in bed, L knee flexion spasms limit ability to turn and position in bed. Pt max A for UB dressing sitting EOB, difficulty maintaining balance while sitting to participate in ADLs  Cognition: Cognition Overall Cognitive Status: Within Functional Limits for tasks assessed Orientation Level: Oriented X4 Cognition Arousal: Alert Behavior During Therapy: WFL for tasks assessed/performed Overall Cognitive Status: Within Functional Limits for tasks assessed General Comments: able to fully participate  Physical Exam: Blood pressure 103/60, pulse 99, temperature 98.2 F (36.8 C), temperature source Oral, resp. rate 15, height 5\' 9"  (1.753 m), weight 99.8 kg, SpO2 99%. Physical Exam Vitals and nursing note reviewed.  Constitutional:      General: He is not in acute distress.    Appearance: He is obese.     Comments: Awake, but sleepy- just woke up- pretty alert; NAD  HENT:     Head: Normocephalic and atraumatic.     Right Ear: External ear normal.     Left Ear: External ear normal.     Nose: Nose normal. No congestion.     Mouth/Throat:     Mouth: Mucous membranes are dry.     Pharynx: Oropharynx is clear. No oropharyngeal exudate.  Eyes:     General:        Right eye: No discharge.        Left eye: No discharge.     Extraocular Movements: Extraocular movements intact.  Cardiovascular:     Rate and Rhythm: Normal rate and regular rhythm.     Heart sounds: Normal heart sounds. No murmur heard.    No gallop.  Pulmonary:     Effort: Pulmonary effort is normal. No respiratory distress.     Breath sounds: Normal breath sounds. No wheezing, rhonchi or rales.  Abdominal:     General: There is no distension.     Tenderness: There is no abdominal tenderness.     Comments: Normoactive BS; slightly protuberant  Musculoskeletal:      Cervical back: Neck supple. No tenderness.     Comments: Ue's 5-/5 throughout on exam B/L RLE- HF 2-/5; KE 2-/5; DF 4-/5; and PF 4-/5 LLE- HF 2-/5; KE 2-/5; DF 2/5; PF 2/5- limited b/l by spasticity  Skin:    General: Skin is warm and dry.     Comments: Incision looks OK   Neurological:     Comments: Sensory level at T4 and below- MAS of 2 of LE's and extensor tone severe- many spasms seen with ROM and 5-6 beats clonus B/L LE's- but is better than was last time saw pt  Psychiatric:     Comments: Depressed; anxious, and sleepy     Results for orders placed or performed during the hospital encounter of 06/13/23 (from the past 48 hours)  CBC     Status: Abnormal   Collection Time: 06/17/23  2:58 AM  Result Value Ref Range   WBC 7.6 4.0 - 10.5 K/uL  RBC 3.07 (L) 4.22 - 5.81 MIL/uL   Hemoglobin 10.3 (L) 13.0 - 17.0 g/dL   HCT 40.9 (L) 81.1 - 91.4 %   MCV 96.1 80.0 - 100.0 fL   MCH 33.6 26.0 - 34.0 pg   MCHC 34.9 30.0 - 36.0 g/dL   RDW 78.2 95.6 - 21.3 %   Platelets 346 150 - 400 K/uL   nRBC 0.0 0.0 - 0.2 %    Comment: Performed at [facility], [address] [place], [place] [address]  Basic metabolic panel     Status: Abnormal   Collection Time: 06/17/23  2:58 AM  Result Value Ref Range   Sodium 136 135 - 145 mmol/L   Potassium 3.7 3.5 - 5.1 mmol/L   Chloride 103 98 - 111 mmol/L   CO2 26 22 - 32 mmol/L   Glucose, Bld 109 (H) 70 - 99 mg/dL    Comment: Glucose reference range applies only to samples taken after fasting for at least 8 hours.   BUN 8 6 - 20 mg/dL   Creatinine, Ser 8.46 0.61 - 1.24 mg/dL   Calcium 9.2 8.9 - 96.2 mg/dL   GFR, Estimated >95 >28 mL/min    Comment: (NOTE) Calculated using the CKD-EPI Creatinine Equation (2021)    Anion gap 7 5 - 15    Comment: Performed at [facility], [address], [place], [place] [address]  CBC     Status: Abnormal   Collection Time: 06/18/23  3:07 AM  Result Value Ref Range   WBC 8.1 4.0 - 10.5 K/uL    RBC 3.09 (L) 4.22 - 5.81 MIL/uL   Hemoglobin 10.4 (L) 13.0 - 17.0 g/dL   HCT 40.1 (L) 02.7 - 25.3 %   MCV 96.4 80.0 - 100.0 fL   MCH 33.7 26.0 - 34.0 pg   MCHC 34.9 30.0 - 36.0 g/dL   RDW 66.4 40.3 - 47.4 %   Platelets 373 150 - 400 K/uL   nRBC 0.0 0.0 - 0.2 %    Comment: Performed at [facility], [address] [place], [place] [address]  Basic metabolic panel     Status: Abnormal   Collection Time: 06/18/23  3:07 AM  Result Value Ref Range   Sodium 135 135 - 145 mmol/L   Potassium 4.1 3.5 - 5.1 mmol/L   Chloride 101 98 - 111 mmol/L   CO2 26 22 - 32 mmol/L   Glucose, Bld 110 (H) 70 - 99 mg/dL    Comment: Glucose reference range applies only to samples taken after fasting for at least 8 hours.   BUN 9 6 - 20 mg/dL   Creatinine, Ser 3.87 0.61 - 1.24 mg/dL   Calcium 9.4 8.9 - 56.4 mg/dL   GFR, Estimated >33 >29 mL/min    Comment: (NOTE) Calculated using the CKD-EPI Creatinine Equation (2021)    Anion gap 8 5 - 15    Comment: Performed at [facility], [address], [place], [place] [address]  Glucose, capillary     Status: Abnormal   Collection Time: 06/18/23  7:57 AM  Result Value Ref Range   Glucose-Capillary 105 (H) 70 - 99 mg/dL    Comment: Glucose reference range applies only to samples taken after fasting for at least 8 hours.   No results found.    Blood pressure 103/60, pulse 99, temperature 98.2 F (36.8 C), temperature source Oral, resp. rate 15, height 5\' 9"  (1.753 m), weight  99.8 kg, SpO2 99%.  Medical Problem List and Plan: 1. Functional deficits secondary to Cervical myelopathy s/p ACDF- back after sepsis  -patient may  shower  -ELOS/Goals: 3-4 weeks- min A hopefully Admit to CIR 2.  Antithrombotics: -DVT/anticoagulation:  Pharmaceutical: Lovenox  -antiplatelet therapy: N/A 3. Pain Management:  MS contin 15 mg bid, Oxycodone prn. Lidoderm patch for local measures. Tylenol 650 mg qid.  --bilateral hip pain with attempts at rolling.    --on Baclofen 20 mg qid, Valium 5 mg qid with Zanaflex 4 mg qid prn for spasticity.  Starting Duloxetine 20 mg at bedtime- titrate up every 3-4 days as tolerated 4. Mood/Behavior/Sleep: LCSW to follow for evaluation and support.   -antipsychotic agents: N/A 5. Neuropsych/cognition: This patient is capable of making decisions on his own behalf. 6. Skin/Wound Care: Routine pressure relief measures.  7. Fluids/Electrolytes/Nutrition: Monitor I/O. Check CMET in am 8. Klebsiella aerogenes bacteremia/sepsis /UTI: On Levaquin D #6/7 9. Neurogenic bowel: Continue Senna S 2 tabs BID. Miralax daily-->has been incontinent of bowel and question due to antibiotics and/or laxatives --Suppository daily after supper-->has refused for past 3 nights.  --would prefer enema three times a week as at home. Strongly suggested using mini enema 10. Neurogenic bladder: Foley was replaced on acute-->will d/c and start bladder program. (patient would like to resume) --Monitor voiding with PVR checks  --use coude cath and monitor for recurrent hematuria  --Continue flomax 11. Neuropathy/at level SCI pain: Was on Cymbalta PTA--will resume at 20 mg QHS       Jacquelynn Cree, PA-C 06/18/2023   I have personally performed a face to face diagnostic evaluation of this patient and formulated the key components of the plan.  Additionally, I have personally reviewed laboratory data, imaging studies, as well as relevant notes and concur with the physician assistant's documentation above.   The patient's status has not changed from the original H&P.  Any changes in documentation from the acute care chart have been noted above.

## 2023-06-18 NOTE — Progress Notes (Signed)
Patient says for days he feels like a corset is tightened around him which feels breathing restrictions but he's not short of breath and his O2 sats are good. Will inform next nurse.  Harriet Masson, RN

## 2023-06-18 NOTE — TOC Transition Note (Signed)
Transition of Care (TOC) - Discharge Note Donn Pierini RN, BSN Transitions of Care Unit 4E- RN Case Manager See Treatment Team for direct phone #   Patient Details  Name: Leon Rodriguez MRN: [id number] Date of Birth: May 07, 1966  [facility] CM/SW Contact:  Darrold Span, RN Phone Number: 06/18/2023, 2:30 PM   Clinical Narrative:    Received msg that pt has bed to re-admit to INPT rehab- pt stable for transition back to INPT rehab per attending team.   Plan to re-admit to [facility] INPT rehab later this afternoon.   No further TOC needs noted.    Final next level of care: IP Rehab Facility Barriers to Discharge: No Barriers Identified   Patient Goals and CMS Choice Patient states their goals for this hospitalization and ongoing recovery are:: return to INPT rehab          Discharge Placement             [facility] INPT rehab          Discharge Plan and Services Additional resources added to the After Visit Summary for     Discharge Planning Services: CM Consult Post Acute Care Choice: IP Rehab          DME Arranged: N/A DME Agency: NA       HH Arranged: NA HH Agency: NA        Social Drivers of Health (SDOH) Interventions SDOH Screenings   Food Insecurity: No Food Insecurity (06/14/2023)  Housing: Low Risk  (06/14/2023)  Transportation Needs: No Transportation Needs (06/14/2023)  Utilities: Patient Declined (06/14/2023)  Depression (PHQ2-9): High Risk (04/05/2023)  Tobacco Use: High Risk (06/14/2023)     Readmission Risk Interventions    06/07/2023   12:29 PM  Readmission Risk Prevention Plan  Medication Screening Complete  Transportation Screening Complete

## 2023-06-18 NOTE — Progress Notes (Signed)
Patient is being transferred to CIR 4 west RM04, hand off given, patient's belongings taken by pt's family member, CCMD notified.  06/18/23 1509  Vitals  Temp 98.2 F (36.8 C)  Temp Source Oral  BP 95/60  MAP (mmHg) 72  BP Location Left Arm  BP Method Automatic  Patient Position (if appropriate) Lying  Pulse Rate Source Monitor  ECG Heart Rate 95  Resp 16  Level of Consciousness  Level of Consciousness Alert  MEWS COLOR  MEWS Score Color Green  Oxygen Therapy  SpO2 98 %  O2 Device Room Air  MEWS Score  MEWS Temp 0  MEWS Systolic 1  MEWS Pulse 0  MEWS RR 0  MEWS LOC 0  MEWS Score 1

## 2023-06-18 NOTE — Discharge Instructions (Addendum)
Mr Tamer, Greim were admitted to our hospital service for a urinary tract infection and bloodstream infection. At this point you are stable to return to inpatient rehab. . Thank you for allowing [place] to be part of your care.   We will arrange a hospital follow up appointment at our [facility] clinic after you leave inpatient rehab.   Please make sure to return to the hospital if you have worsening pain, fever, or malaise.   Please call our clinic if you have any questions or concerns, we may be able to help and keep you from a long and expensive emergency room wait. Our clinic and after hours phone number is [phone], the best time to call is Monday through Friday 9 am to 4 pm but there is always someone available 24/7 if you have an emergency. If you need medication refills please notify your pharmacy one week in advance and they will send [place] a request.

## 2023-06-18 NOTE — Progress Notes (Signed)
Ranelle Oyster, MD Physician Physical Medicine and Rehabilitation   Consult Note    Addendum   Date of Service: 06/06/2023 10:32 AM  Related encounter: Admission (Discharged) from 06/05/2023 in [place] [facility]   Expand All Collapse All  Show:Clear all [x] Written[x] Templated[] Copied  Added by: [x] Ranelle Oyster, MD  [] Hover for details          Physical Medicine and Rehabilitation Consult Reason for Consult: Cervical myelopathy Referring Physician: [place]     HPI: Leon Rodriguez is a 57 y.o. male with a history of worsening bilateral lower extremity weakness and sensory loss progressing over several months.  He had progressed to the point where patient was requiring a wheelchair for mobility.  Patient developed spasticity as well.  MRI of the cervical spine revealed a C6-T1 severe cervical stenosis with cord compression and myelopathy.  Patient presented on 06/05/2023 for C6-T1 ACDF by Dr. Jake Samples.  Patient with significant lower extremity spasms this morning.  Valium was added and baclofen was increased.  He remains in a cervical collar.  Therapy is pending this morning.  C6-T1 cervical myelopathy     Review of Systems  Constitutional:  Negative for fever.  HENT: Negative.    Eyes: Negative.   Respiratory: Negative.    Cardiovascular: Negative.   Gastrointestinal:  Positive for constipation.  Genitourinary:  Positive for frequency and urgency.  Musculoskeletal:  Positive for back pain and myalgias.  Skin:  Negative for rash.  Neurological:  Positive for sensory change and focal weakness. Dizziness: spasticity of LE's. Psychiatric/Behavioral:  Negative for substance abuse.         Past Medical History:  Diagnosis Date   Arthritis     Hip pain               Past Surgical History:  Procedure Laterality Date   APPENDECTOMY       CIRCUMCISION       HIATAL HERNIA REPAIR       HIP SURGERY        at 53 and 57 yo        History  reviewed. No pertinent family history.     Social History:  reports that he has been smoking cigarettes and cigars. He has never used smokeless tobacco. He reports that he does not currently use alcohol. He reports current drug use. Drug: Marijuana. Allergies:  Allergies  No Known Allergies         Medications Prior to Admission  Medication Sig Dispense Refill   Baclofen 5 MG TABS Take 1 tablet by mouth 3 (three) times daily as needed (muscle spasms).       famotidine (ZANTAC 360) 10 MG tablet Take 10 mg by mouth daily as needed for heartburn or indigestion.       acetaminophen (TYLENOL) 500 MG tablet Take 2 tablets (1,000 mg total) by mouth every 8 (eight) hours as needed for fever. (Patient not taking: Reported on 06/03/2023) 30 tablet 0          Home: Home Living Family/patient expects to be discharged to:: Private residence Living Arrangements: Spouse/significant other Available Help at Discharge: Family Type of Home: House Home Access: Stairs to enter Secretary/administrator of Steps: 2 Entrance Stairs-Rails: None Home Layout: Two level, Bed/bath upstairs Bathroom Shower/Tub: Tub/shower unit Home Equipment: Shower seat, Higher education careers adviser held shower head, Agricultural consultant (2 wheels), [employer] - single point, Wheelchair - manual, Crutches  Functional History: Prior Function Prior Level of Function :  Needs assist Mobility Comments: pt states he stays on the second floor and only goes downstairs for appts, had a fall on stairs a couple of months ago. pt has been walking short distances with RW. pt reports 4 falls in the past 6 months ADLs Comments: wife does dressing and LB washup, has been getting in and out of tub but has had a fall there Functional Status:  Mobility:   ADL:   Cognition: Cognition Orientation Level: Oriented X4   Blood pressure 120/76, pulse (!) 111, temperature 98 F (36.7 C), temperature source Oral, resp. rate 16, height 5\' 9"  (1.753 m), weight 93 kg, SpO2  94%. Physical Exam Constitutional:      General: He is not in acute distress.    Appearance: He is normal weight.  HENT:     Head: Normocephalic.     Right Ear: External ear normal.     Left Ear: External ear normal.     Nose: Nose normal.     Mouth/Throat:     Mouth: Mucous membranes are moist.  Eyes:     Pupils: Pupils are equal, round, and reactive to light.  Neck:     Comments: C-collar Cardiovascular:     Rate and Rhythm: Tachycardia present.  Pulmonary:     Effort: Pulmonary effort is normal.  Abdominal:     Palpations: Abdomen is soft.  Neurological:     Mental Status: He is alert.     Comments: Alert and oriented x 3. Normal insight and awareness. Intact Memory. Normal language and speech. Cranial nerve exam unremarkable. MMT: RUE 5/5. LUE 5/5 deltoid, biceps, 4+ triceps, wrist extension, HI. Pt moves both LE's but significantly inhibited by spasticity and pain. MAS of LE's 3-4/5 with pt going into full extensor spasms when I attempted to range his knees and feet.   Has sl tingling of finger tips bilaterally and 1/2 sensation below inguinal area bilaterally.  Psychiatric:        Mood and Affect: Mood normal.        Behavior: Behavior normal.        Lab Results Last 24 Hours  No results found for this or any previous visit (from the past 24 hours).    Imaging Results (Last 48 hours)  DG Cervical Spine 1 View Result Date: 06/05/2023 CLINICAL DATA:  Fusion EXAM: Intraoperative fluoroscopy COMPARISON:  None Available. FINDINGS: Four fluoroscopic spot images submitted for review demonstrate placement of anterior fixation plates and prosthetic disc material along the lower cervical spine. Imaging was obtained to aid in treatment. Please correlate with real-time fluoroscopy of 45.1 seconds. Cumulative dose 13.4 mGy IMPRESSION: Intraoperative fluoroscopy Electronically Signed   By: Karen Kays M.D.   On: 06/05/2023 18:44    DG C-Arm 1-60 Min-No Report Result Date:  06/05/2023 Fluoroscopy was utilized by the requesting physician.  No radiographic interpretation.    DG C-Arm 1-60 Min-No Report Result Date: 06/05/2023 Fluoroscopy was utilized by the requesting physician.  No radiographic interpretation.    DG C-Arm 1-60 Min-No Report Result Date: 06/05/2023 Fluoroscopy was utilized by the requesting physician.  No radiographic interpretation.       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 Yes Co-Morbidities requiring supervision/potential complications:  -Severe lower extremity spasticity -Postoperative pain -Neurogenic bowel -Neurogenic bladder  Due to bladder management, bowel management, safety, skin/wound care, disease management, medication administration, pain management, and patient education, does the patient require 24 hr/day rehab nursing? Yes Does the patient require coordinated care of a physician, rehab nurse, therapy disciplines of PT, OT to address physical and functional deficits in the context of the above medical diagnosis(es)? Yes Addressing deficits in the following areas: balance, endurance, locomotion, strength, transferring, bowel/bladder control, bathing, dressing, feeding, grooming, toileting, and psychosocial support Can the patient actively participate in an intensive therapy program of at least 3 hrs of therapy per day at least 5 days per week? Yes The potential for patient to make measurable gains while on inpatient rehab is excellent Anticipated functional outcomes upon discharge from inpatient rehab are modified independent and supervision  with PT, modified independent and supervision with OT, n/a with SLP., +/- at w/c level Estimated rehab length of stay to reach the above functional  goals is: 18-24 days Anticipated discharge destination: Home Overall Rehab/Functional Prognosis: excellent   POST ACUTE RECOMMENDATIONS: This patient's condition is appropriate for continued rehabilitative care in the following setting: CIR Patient has agreed to participate in recommended program. Yes Note that insurance prior authorization may be required for reimbursement for recommended care.   Comment: Pt has been experiencing motor and sensory loss over the last year. Symptoms worsened to the point where he could no longer work or drive this past June. I told him that we should be able to help his spasticity and lower extremity strength and ROM, but given the chronicity of his myelopathy recovery will be a prolonged course. One blessing is that he has minimal involvement of his upper extremities.     MEDICAL RECOMMENDATIONS: He needs scheduled spasticity medication. I will start by changing baclofen to 10mg  q8 hours. I would titrate it up aggressively. Can continue valium prn Maintain mobility and ROM efforts with PT, OT Pain control as you are doing.  Recommend LE venous dopplers to rule out DVT given his chronic LE weakness. Also recommend sq lovenox 30mg  q12 for DVT prophylaxis Begin scheduled stool softener as he has early neurogenic bladder Timed voids for neurogenic bladder, scan/cath prn for vol >350 cc     I have personally performed a face to face diagnostic evaluation of this patient. Additionally, I have examined the patient's medical record including any pertinent labs and radiographic images. If the physician assistant has documented in this note, I have reviewed and edited or otherwise concur with the physician assistant's documentation.   Thanks,   Ranelle Oyster, MD 06/06/2023       Revision History  Routing History

## 2023-06-19 DIAGNOSIS — G959 Disease of spinal cord, unspecified: Secondary | ICD-10-CM | POA: Diagnosis not present

## 2023-06-19 LAB — COMPREHENSIVE METABOLIC PANEL
ALT: 45 U/L — ABNORMAL HIGH (ref 0–44)
AST: 33 U/L (ref 15–41)
Albumin: 2.7 g/dL — ABNORMAL LOW (ref 3.5–5.0)
Alkaline Phosphatase: 110 U/L (ref 38–126)
Anion gap: 9 (ref 5–15)
BUN: 9 mg/dL (ref 6–20)
CO2: 26 mmol/L (ref 22–32)
Calcium: 9.6 mg/dL (ref 8.9–10.3)
Chloride: 99 mmol/L (ref 98–111)
Creatinine, Ser: 0.78 mg/dL (ref 0.61–1.24)
GFR, Estimated: >60 mL/min (ref >60)
Glucose, Bld: 160 mg/dL — ABNORMAL HIGH (ref 70–99)
Potassium: 3.5 mmol/L (ref 3.5–5.1)
Sodium: 134 mmol/L — ABNORMAL LOW (ref 135–145)
Total Bilirubin: 0.6 mg/dL (ref <1.2)
Total Protein: 6.1 g/dL — ABNORMAL LOW (ref 6.5–8.1)

## 2023-06-19 LAB — CBC WITH DIFFERENTIAL/PLATELET
Abs Immature Granulocytes: 0.06 10*3/uL (ref 0.00–0.07)
Basophils Absolute: 0.1 10*3/uL (ref 0.0–0.1)
Basophils Relative: 1 %
Eosinophils Absolute: 0.1 10*3/uL (ref 0.0–0.5)
Eosinophils Relative: 1 %
HCT: 30.2 % — ABNORMAL LOW (ref 39.0–52.0)
Hemoglobin: 10.7 g/dL — ABNORMAL LOW (ref 13.0–17.0)
Immature Granulocytes: 1 %
Lymphocytes Relative: 18 %
Lymphs Abs: 1.8 10*3/uL (ref 0.7–4.0)
MCH: 33.8 pg (ref 26.0–34.0)
MCHC: 35.4 g/dL (ref 30.0–36.0)
MCV: 95.3 fL (ref 80.0–100.0)
Monocytes Absolute: 0.7 10*3/uL (ref 0.1–1.0)
Monocytes Relative: 8 %
Neutro Abs: 7 10*3/uL (ref 1.7–7.7)
Neutrophils Relative %: 71 %
Platelets: 409 10*3/uL — ABNORMAL HIGH (ref 150–400)
RBC: 3.17 MIL/uL — ABNORMAL LOW (ref 4.22–5.81)
RDW: 12.4 % (ref 11.5–15.5)
WBC: 9.7 10*3/uL (ref 4.0–10.5)
nRBC: 0 % (ref 0.0–0.2)

## 2023-06-19 MED ORDER — TAMSULOSIN HCL 0.4 MG PO CAPS
0.8000 mg | ORAL_CAPSULE | Freq: Every day | ORAL | Status: DC
  Administered 2023-06-19 – 2023-07-11 (×23): 0.8 mg via ORAL
  Filled 2023-06-19 (×23): qty 2

## 2023-06-19 NOTE — Progress Notes (Signed)
PROGRESS NOTE   Subjective/Complaints: No complaints this morning Wife at bedside has no concerns Labs stable this morning Retaining urine  ROS: +urinary retention   Objective:   No results found. Recent Labs    06/18/23 0307 06/19/23 0520  WBC 8.1 9.7  HGB 10.4* 10.7*  HCT 29.8* 30.2*  PLT 373 409*   Recent Labs    06/18/23 0307 06/19/23 0520  NA 135 134*  K 4.1 3.5  CL 101 99  CO2 26 26  GLUCOSE 110* 160*  BUN 9 9  CREATININE 0.77 0.78  CALCIUM 9.4 9.6    Intake/Output Summary (Last 24 hours) at 06/19/2023 1018 Last data filed at 06/19/2023 0939 Gross per 24 hour  Intake 240 ml  Output 860 ml  Net -620 ml        Physical Exam: Vital Signs Blood pressure 98/72, pulse 91, temperature 98.3 F (36.8 C), resp. rate 18, height 5\' 9"  (1.753 m), weight 99.8 kg, SpO2 100%. Gen: no distress, normal appearing HEENT: oral mucosa pink and moist, NCAT Cardio: Reg rate Chest: normal effort, normal rate of breathing Abd: soft, non-distended Ext: no edema Psych: pleasant, normal affect Skin: intact Musculoskeletal:     Cervical back: Neck supple. No tenderness.     Comments: Ue's 5-/5 throughout on exam B/L RLE- HF 2-/5; KE 2-/5; DF 4-/5; and PF 4-/5 LLE- HF 2-/5; KE 2-/5; DF 2/5; PF 2/5- limited b/l by spasticity  Skin:    General: Skin is warm and dry.     Comments: Incision looks OK   Neurological:     Comments: Sensory level at T4 and below- MAS of 2 of LE's and extensor tone severe- many spasms seen with ROM and 5-6 beats clonus B/L LE's- but is better than was last time saw pt  Psychiatric:     Comments: Depressed; anxious, and sleepy    Assessment/Plan: 1. Functional deficits which require 3+ hours per day of interdisciplinary therapy in a comprehensive inpatient rehab setting. Physiatrist is providing close team supervision and 24 hour management of active medical problems listed  below. Physiatrist and rehab team continue to assess barriers to discharge/monitor patient progress toward functional and medical goals  Care Tool:  Bathing              Bathing assist       Upper Body Dressing/Undressing Upper body dressing        Upper body assist      Lower Body Dressing/Undressing Lower body dressing            Lower body assist       Toileting Toileting    Toileting assist       Transfers Chair/bed transfer  Transfers assist           Locomotion Ambulation   Ambulation assist              Walk 10 feet activity   Assist           Walk 50 feet activity   Assist           Walk 150 feet activity   Assist  Walk 10 feet on uneven surface  activity   Assist           Wheelchair     Assist               Wheelchair 50 feet with 2 turns activity    Assist            Wheelchair 150 feet activity     Assist          Blood pressure 98/72, pulse 91, temperature 98.3 F (36.8 C), resp. rate 18, height 5\' 9"  (1.753 m), weight 99.8 kg, SpO2 100%.  Medical Problem List and Plan: 1. Functional deficits secondary to Cervical myelopathy s/p ACDF- back after sepsis             -patient may  shower             -ELOS/Goals: 3-4 weeks- min A hopefully Grounds pass ordered 2.  Antithrombotics: -DVT/anticoagulation:  Pharmaceutical: Lovenox             -antiplatelet therapy: N/A 3. Pain Management:  MS contin 15 mg bid, Oxycodone prn. Lidoderm patch for local measures. Tylenol 650 mg qid.             --bilateral hip pain with attempts at rolling.              --on Baclofen 20 mg qid, Valium 5 mg qid with Zanaflex 4 mg qid prn for spasticity.  Starting Duloxetine 20 mg at bedtime- titrate up every 3-4 days as tolerated 4. Mood/Behavior/Sleep: LCSW to follow for evaluation and support.              -antipsychotic agents: N/A 5. Neuropsych/cognition: This patient is  capable of making decisions on his own behalf. 6. Skin/Wound Care: Routine pressure relief measures.  7. Hyponatremia: continue to monitor Na weekly  8. Klebsiella aerogenes bacteremia/sepsis /UTI: On Levaquin D #6/7  9. Neurogenic bowel: Continue Senna S 2 tabs BID. Miralax daily-->has been incontinent of bowel and question due to antibiotics and/or laxatives --Suppository daily after supper-->has refused for past 3 nights.  --would prefer enema three times a week as at home. Strongly suggested using mini enema  10. Neurogenic bladder: Foley was replaced on acute-->will d/c and start bladder program. (patient would like to resume) --Monitor voiding with PVR checks             --use coude cath and monitor for recurrent hematuria             --continue flomax  11. 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

## 2023-06-19 NOTE — Progress Notes (Signed)
Charge nurse called to room concerning if staff had a remote to the room television and if staff could help hook up Playstation to the television. Educated family at bedside that it was hospital policy that gaming systems not be connected or interfaced to the television. Family stated that a nurse a told them that they can bring the device. Family was again educated about policy and that they can talk to the unit manager when they are available. Cletis [employer], RN

## 2023-06-19 NOTE — Progress Notes (Signed)
Inpatient Rehabilitation Admission Medication Review by a Pharmacist  A complete drug regimen review was completed for this patient to identify any potential clinically significant medication issues.  High Risk Drug Classes Is patient taking? Indication by Medication  Antipsychotic Yes, as an intravenous medication Prochlorperazine for nausea   Anticoagulant Yes Enoxaparin for DVT prophylaxis  Antibiotic Yes Levofloxacin for bacteremia and UTI   Opioid Yes Oxycodone, morphine for pain   Antiplatelet No   Hypoglycemics/insulin No   Vasoactive Medication No   Chemotherapy No   Other Yes Acetaminophen for mild pain Baclofen,diazepam, tizanidine for muscle spasms Diphenhydramine for itching  Docusate, senna-doc, sorbitol , fleet for constipation Duloxetine for nerve pain Guaifenesin, robitussin for cough Lidocaine patch for MSK pain  Lidocaine jelly for penile pain Tamsulosin for neurogenic bladder Maalox for indigestion Trazodone for sleep     Type of Medication Issue Identified Description of Issue Recommendation(s)  Drug Interaction(s) (clinically significant)     Duplicate Therapy     Allergy     No Medication Administration End Date     Incorrect Dose     Additional Drug Therapy Needed     Significant med changes from prior encounter (inform family/care partners about these prior to discharge). HELD hom famotidine INCREASED baclofen dose NEW duloxetine, diazepam, guaifenesin, lidocaine, morphine, oxycodone, miralax, senna-doc, tamsulosin, tizanidine, trazodone Review at discharge   Other       Clinically significant medication issues were identified that warrant physician communication and completion of prescribed/recommended actions by midnight of the next day:  No  Name of provider notified for urgent issues identified:   Provider Method of Notification:     Pharmacist comments:   Time spent performing this drug regimen review (minutes):  20  Alphia Moh,  PharmD, [place], [facility] Clinical Pharmacist  Please check AMION for all [facility] Pharmacy phone numbers After 10:00 PM, call Main Pharmacy [phone]

## 2023-06-19 NOTE — Progress Notes (Signed)
Pt was due for enema at 1800. Day shift nurse [place] passed on during report that pharmacy had not sent the enema to the unit. Once the enema arrived pt did not want to proceed with receiving it. Both pt and wife stated that she had just gave him a wash up and cleaned him up for the night and pt stated that he didn't want to take the enema that late. Pt stated that he had family members coming for Christmas and he didn't want to smell like "shit" or have the room smell like "shit" while his company was here. Pt stated that he would be willing to receive the enema or a prn enema once his family left. Pt's last bowel movement was 12/23. No further concerns at this time. Wife at bedside, call bell in reach.

## 2023-06-19 NOTE — Progress Notes (Signed)
Pt foley was removed per day shift Nurse [place] and stacy around 5:45pm yesterday. Pt was due to void 4-6hrs after removing the foley. Pt was bladder scanned at 2216 with results of 169. Per order pt is only to be cath >350 or no void. Pt brief was slightly saturated however pt was scanned again at 0400 with results of 390. Pt wanted to try to urinate on his own before this nurse attempted to cath but was unsuccessful. Both wife and pt express concerns about the pt being in and out cath and developing another UTI and becoming septic. Pt states that he does not want a foley and he does not want to be continuously in and out cath. This nurse wanted to make other healthcare personnel's aware of the pt's/family concerns. No further concerns at this moment. Wife at bedside, call bell in reach.

## 2023-06-19 NOTE — Progress Notes (Signed)
Inpatient Rehabilitation Care Coordinator Assessment and Plan Patient Details  Name: Leon Rodriguez MRN: [id number] Date of Birth: [birth year]  Today's Date: 06/19/2023  Hospital Problems: Principal Problem:   Cervical myelopathy [facility])  Past Medical History:  Past Medical History:  Diagnosis Date   Arthritis    Hip pain    Sepsis (HCC) 06/13/2023   Past Surgical History:  Past Surgical History:  Procedure Laterality Date   ANTERIOR CERVICAL DECOMP/DISCECTOMY FUSION N/A 06/05/2023   Procedure: ANTERIOR CERVICAL DISCECTOMY AND FUSION CERVICAL SIX-SEVEN/CERVICAL SEVEN-THORACIC ONE;  Surgeon: Bethann Goo, DO;  Location: [facility] OR;  Service: Neurosurgery;  Laterality: N/A;  3C   APPENDECTOMY     CIRCUMCISION     HIATAL HERNIA REPAIR     HIP SURGERY     at 32 and 57 yo   Social History:  reports that he has been smoking cigarettes and cigars. He has never used smokeless tobacco. He reports that he does not currently use alcohol. He reports current drug use. Drug: Marijuana.  Family / Support Systems Patient Roles: Spouse Spouse/Significant Other: [name] (wIfe) Ability/Limitations of Caregiver: Wife was initially going ot return to work middle Jan 2025. SW will confirm. Caregiver Availability: 24/7 Family Dynamics: Pt lives with his wife  Social History Preferred language: English Religion:  Employment Status: Employed   Abuse/Neglect Abuse/Neglect Assessment Can Be Completed: Yes Physical Abuse: Denies Verbal Abuse: Denies Sexual Abuse: Denies Exploitation of patient/patient's resources: Denies Self-Neglect: Denies  Patient response to:    Emotional Status    Patient / Family Perceptions, Expectations & Goals    Building surveyor: None Premorbid Home Care/DME Agencies: None Transportation available at discharge: wife [name] Is the patient able to respond to transportation needs?: Yes In the past 12 months, has lack of transportation  kept you from medical appointments or from getting medications?: No In the past 12 months, has lack of transportation kept you from meetings, work, or from getting things needed for daily living?: No Resource referrals recommended: Neuropsychology  Discharge Planning Living Arrangements: Spouse/significant other Support Systems: Spouse/significant other Type of Residence: Private residence Insurance Resources: Media planner (specify) ([place] Healthy [employer]) Surveyor, quantity Resources: Employment Surveyor, quantity Screen Referred: Yes Living Expenses: Banker Management: Patient Does the patient have any problems obtaining your medications?: No Care Coordinator Barriers to Discharge: Decreased caregiver support, Lack of/limited family support, Insurance for SNF coverage Care Coordinator Anticipated Follow Up Needs: HH/OP  Clinical Impression SW familiar with pt from previous admission. SW will confirm there are no barriers to discharge.   Fadia Marlar A Fabyan Loughmiller 06/19/2023, 3:22 PM

## 2023-06-20 DIAGNOSIS — K592 Neurogenic bowel, not elsewhere classified: Secondary | ICD-10-CM | POA: Diagnosis not present

## 2023-06-20 DIAGNOSIS — R252 Cramp and spasm: Secondary | ICD-10-CM

## 2023-06-20 DIAGNOSIS — G959 Disease of spinal cord, unspecified: Secondary | ICD-10-CM | POA: Diagnosis not present

## 2023-06-20 DIAGNOSIS — G8918 Other acute postprocedural pain: Secondary | ICD-10-CM

## 2023-06-20 DIAGNOSIS — N319 Neuromuscular dysfunction of bladder, unspecified: Secondary | ICD-10-CM | POA: Diagnosis not present

## 2023-06-20 MED ORDER — MELATONIN 5 MG PO TABS
5.0000 mg | ORAL_TABLET | Freq: Every evening | ORAL | Status: DC | PRN
  Administered 2023-06-20: 5 mg via ORAL
  Filled 2023-06-20: qty 1

## 2023-06-20 MED ORDER — BACLOFEN 10 MG PO TABS
25.0000 mg | ORAL_TABLET | Freq: Four times a day (QID) | ORAL | Status: DC
  Administered 2023-06-20 – 2023-06-22 (×7): 25 mg via ORAL
  Filled 2023-06-20 (×7): qty 1

## 2023-06-20 MED ORDER — GABAPENTIN 100 MG PO CAPS
100.0000 mg | ORAL_CAPSULE | Freq: Three times a day (TID) | ORAL | Status: DC
  Administered 2023-06-20 – 2023-06-21 (×3): 100 mg via ORAL
  Filled 2023-06-20 (×7): qty 1

## 2023-06-20 NOTE — Progress Notes (Signed)
Inpatient Rehabilitation  Patient information reviewed and entered into eRehab system by Demarrio Menges Treg Diemer, OTR/L, Rehab Quality Coordinator.   Information including medical coding, functional ability and quality indicators will be reviewed and updated through discharge.   

## 2023-06-20 NOTE — Evaluation (Signed)
Occupational Therapy Assessment and Plan  Patient Details  Name: Leon Rodriguez MRN: [id number] Date of Birth: 1965-08-05  OT Diagnosis: acute pain, muscle weakness (generalized), and spasticity of trunk and LEs Rehab Potential: Rehab Potential (ACUTE ONLY): Fair ELOS: 28-30 days   Today's Date: 06/20/2023 OT Individual Time: 4010-2725 OT Individual Time Calculation (min): 75 min     Hospital Problem: Principal Problem:   Cervical myelopathy (HCC)   Past Medical History:  Past Medical History:  Diagnosis Date   Arthritis    Hip pain    Sepsis (HCC) 06/13/2023   Past Surgical History:  Past Surgical History:  Procedure Laterality Date   ANTERIOR CERVICAL DECOMP/DISCECTOMY FUSION N/A 06/05/2023   Procedure: ANTERIOR CERVICAL DISCECTOMY AND FUSION CERVICAL SIX-SEVEN/CERVICAL SEVEN-THORACIC ONE;  Surgeon: Leon Goo, DO;  Location: [facility] OR;  Service: Neurosurgery;  Laterality: N/A;  3C   APPENDECTOMY     CIRCUMCISION     HIATAL HERNIA REPAIR     HIP SURGERY     at 51 and 57 yo    Assessment & Plan Clinical Impression:  Leon Rodriguez is a 57 year old male with history of progressive BLE weakness with numbness, difficulty walking with multiple falls, bowel incontinence and spasticity for months. 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  He underwent ACDF C6-T1 by Dr. Jake Rodriguez on 08/05/2022 and postop continued to have significant weakness with lower extremity spasms.  Therapy evaluations revealed functional decline and CIR was recommended for follow-up therapy.  He was noted to have significant lower extremity spasticity with tone and baclofen was titrated upward in addition to Zanaflex and Valium.  He was started on bowel and bladder program due to issues with neurogenic bowel and bladder.  He required In-N-Out caths and developed hematuria due to traumatic catheterization.  Lovenox was held briefly and Flomax added to help  with voiding function.    Hospital course significant for fever with sinus tach secondary to sepsis on 06/12/2023.  He was pancultured, started on IV fluids and transferred to acute hospital for management.  He was found to have Klebsiella erogenous bacteremia secondary to UTI's/cystitis.  He was started on cefepime and transition to Levaquin through 12/25 to complete 7-day course antibiotic regimen. Fevers have resolved with intermittent low blood pressures. He reported issues with left knee pain and stiffness felt to be due to spasticity. Therapy has continued to work with  patient who continues to require +2 max assist with lift for tranfers and CIR recommended due to functional deficits. He was cleared tor resume intensive rehab program.        Patient transferred to CIR on 06/18/2023 .    Patient currently requires total with lower body basic self-care skills secondary to muscle weakness and muscle joint tightness, abnormal tone, unbalanced muscle activation, and decreased coordination, and decreased sitting balance, decreased standing balance, decreased postural control, and decreased balance strategies.  Prior to hospitalization, patient could complete ADLs with min.  Patient will benefit from skilled intervention to increase independence with basic self-care skills prior to discharge home with care partner.  Anticipate patient will require moderate physical assestance and follow up home health.  OT - End of Session Activity Tolerance: Tolerates 10 - 20 min activity with multiple rests Endurance Deficit: Yes OT Assessment Rehab Potential (ACUTE ONLY): Fair OT Barriers to Discharge: Home environment access/layout;Other (comments) OT Barriers to Discharge Comments: severe LE spasticity will make caregiving challenging for his family to mobilize  him OT Patient demonstrates impairments in the following area(s): Balance;Endurance;Motor;Pain;Sensory OT Basic ADL's Functional Problem(s):  Bathing;Dressing;Toileting OT Transfers Functional Problem(s): Toilet OT Additional Impairment(s): None OT Plan OT Intensity: Minimum of 1-2 x/day, 45 to 90 minutes OT Frequency: 5 out of 7 days OT Duration/Estimated Length of Stay: 28-30 days OT Treatment/Interventions: DME/adaptive equipment instruction;Therapeutic Activities;UE/LE Strength taining/ROM;Therapeutic Exercise;Self Care/advanced ADL retraining;Balance/vestibular training;UE/LE Coordination activities;Patient/family education;Psychosocial support;Functional mobility training;Discharge planning;Neuromuscular re-education;Pain management OT Self Feeding Anticipated Outcome(s): independent OT Basic Self-Care Anticipated Outcome(s): set up UB self care, mod A LB self care OT Toileting Anticipated Outcome(s): Mod A OT Bathroom Transfers Anticipated Outcome(s): Mod A to toilet OT Recommendation Patient destination: Home Follow Up Recommendations: Home health OT Equipment Recommended: 3 in 1 bedside comode   OT Evaluation Precautions/Restrictions  Precautions Precautions: Fall;Cervical Precaution Comments: hx of many falls, severe B LE hypertonia with muscle spasms, verbally went through cervical precautions throughout session Required Braces or Orthoses: Cervical Brace Cervical Brace: Hard collar Restrictions Other Position/Activity Restrictions: cervical precautions   Pain Pain Assessment Pain Scale: 0-10 Pain Score: 6  Pain Type: Acute pain Pain Location: Shoulder Pain Intervention(s): Medication (See eMAR) Home Living/Prior Functioning Home Living Family/patient expects to be discharged to:: Private residence Living Arrangements: Spouse/significant other Available Help at Discharge: Family, Available 24 hours/day Type of Home: House Home Access: Stairs to enter [employer] of Steps: 1 Entrance Stairs-Rails: None Home Layout: Two level, Bed/bath upstairs, 1/2 bath on main level Bathroom Shower/Tub:  Tub/shower unit [employer]: Standard Additional Comments: reports started using w/c in June 2024 - he uses the wheelchair downstairs for more mobility, goes downstairs 1x/day to smoke a cigar (but hasn't been downstairs in ~67month PTA), uses RW upstairs for very short distance gait to/from bathroom for BM otherwise uses urinal for bladder -- S.O. 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 position  Lives With: Spouse Prior Function Level of Independence: Requires assistive device for independence, Needs assistance with ADLs, Needs assistance with gait  Able to Take Stairs?: No Driving: No Vision Baseline Vision/History: 0 No visual deficits Ability to See in Adequate Light: 0 Adequate Patient Visual Report: No change from baseline Vision Assessment?: No apparent visual deficits Perception  Perception: Within Functional Limits Praxis Praxis: WFL Cognition Cognition Overall Cognitive Status: Within Functional Limits for tasks assessed Safety/Judgment: Appears intact Brief Interview for Mental Status (BIMS) Repetition of Three Words (First Attempt): 3 Temporal Orientation: Year: Correct Temporal Orientation: Month: Accurate within 5 days Temporal Orientation: Day: Correct Recall: "Sock": Yes, no cue required Recall: "Blue": Yes, no cue required Recall: "Bed": Yes, no cue required BIMS Summary Score: 15 Sensation Sensation Light Touch: Impaired Detail Peripheral sensation comments: impaired sensation in B LEs, can feel deep touch Light Touch Impaired Details: Impaired RLE;Impaired LLE Proprioception: Impaired Detail Proprioception Impaired Details: Impaired LLE;Impaired RLE Coordination Gross Motor Movements are Fluid and Coordinated: No Fine Motor Movements are Fluid and Coordinated: No Motor  Motor Motor: Abnormal tone;Paraplegia;Other (comment);Abnormal postural alignment and  control Motor - Skilled Clinical Observations: severe B LE hypertonia with muscle spasms, B LE weakness, and sensory deficits  Trunk/Postural Assessment  Cervical Assessment Cervical Assessment: Exceptions to [facility] Cervical AROM Overall Cervical AROM: Due to precautions Postural Control Trunk Control: impaired and stays in a posterior pelvic tilt position due to B LE extensor tone  Balance Static Sitting Balance Static Sitting - Level of Assistance: 3: Mod assist Dynamic  Sitting Balance Dynamic Sitting - Level of Assistance: 1: +1 Total assist Sitting balance - Comments: poor sitting balance, difficulty placing BLEs in comfortable position to allow for proper sitting balance Static Standing Balance Static Standing - Level of Assistance: Not tested (comment) Dynamic Standing Balance Dynamic Standing - Level of Assistance: Not tested (comment) Extremity/Trunk Assessment RUE Assessment Active Range of Motion (AROM) Comments: WFL General Strength Comments: 4/4 LUE Assessment Active Range of Motion (AROM) Comments: WFL General Strength Comments: 4/5  Care Tool Care Tool Self Care Eating   Eating Assist Level: Set up assist    Oral Care    Oral Care Assist Level: Set up assist    Bathing   Body parts bathed by patient: Face Body parts bathed by helper: Right arm;Left arm;Chest;Abdomen;Front perineal area;Buttocks;Right upper leg;Left upper leg;Right lower leg;Left lower leg (wife assisted pt with bath prior to OT evaluation)   Assist Level: Total Assistance - Patient < 25%    Upper Body Dressing(including orthotics)   What is the patient wearing?: Pull over shirt   Assist Level: Minimal Assistance - Patient > 75%    Lower Body Dressing (excluding footwear)   What is the patient wearing?: Pants;Incontinence brief Assist for lower body dressing: Total Assistance - Patient < 25%    Putting on/Taking off footwear   What is the patient wearing?: Socks;Shoes Assist for footwear:  Total Assistance - Patient < 25%       Care Tool Toileting Toileting activity   Assist for toileting: 2 Helpers     Care Tool Bed Mobility Roll left and right activity   Roll left and right assist level: Total Assistance - Patient < 25%    Sit to lying activity   Sit to lying assist level: 2 Helpers    Lying to sitting on side of bed activity   Lying to sitting on side of bed assist level: the ability to move from lying on the back to sitting on the side of the bed with no back support.: 2 Helpers     Care Tool Transfers Sit to stand transfer   Sit to stand assist level: 2 Helpers (unable to rise to full stand with bariatric stedy)    Chair/bed transfer   Chair/bed transfer assist level: 2 Software engineer transfer   Assist Level: 2 Helpers     Care Tool Cognition  Expression of Ideas and Wants Expression of Ideas and Wants: 4. Without difficulty (complex and basic) - expresses complex messages without difficulty and with speech that is clear and easy to understand  Understanding Verbal and Non-Verbal Content Understanding Verbal and Non-Verbal Content: 4. Understands (complex and basic) - clear comprehension without cues or repetitions   Memory/Recall Ability Memory/Recall Ability : Current season;Location of own room;Staff names and faces;That he or she is in a hospital/hospital unit   Refer to Care Plan for Long Term Goals  SHORT TERM GOAL WEEK 1    Recommendations for other services: None    Skilled Therapeutic Intervention ADL ADL Eating: Set up Grooming: Setup Upper Body Bathing: Moderate assistance Where Assessed-Upper Body Bathing: Bed level Where Assessed-Lower Body Bathing: Bed level Upper Body Dressing: Minimal assistance Where Assessed-Upper Body Dressing: Edge of bed Lower Body Dressing: Dependent Where Assessed-Lower Body Dressing: Bed level Toileting: Dependent Toilet Transfer Method: Not assessed (will need sara stedy lift)    Pt seen for  initial evaluation and ADL training with a focus on sitting balance.  Pt received sitting EOB  with wife sitting right next to him. He had numerous pillows stacked behind and next to him to support his balance. She reported that she and nursing bathed and dressed him this am except for his tshirt. C collar was off as pt drinking coffee with wife's assist.  Recommended pt keep collar on as much as possible to support his neck.  Pt donned t shirt with min - mod A to fully pull down his trunk, pt able to don over arms.  Pt discussed his ongoing issues with intense spasticity.  Rehab tech arrived for a +2 assist.  We attempted 3 x to have pt try to stand using the bariatric stedy. Due to adduction of hips spasticity, placed pillow between his knees.  Pt able to lean forward and lift hips off bed 1 inch but unable to rise to stand. His hamstrings are very tight and unable to extend.  Suggested pt try the Corene Cornea.  Pt moved into supine with total of 2.  Pt had spasms of LEs. Showed his wife how to do gentle rocking of legs prior to massage and stretch.  Worked on leg extension PROM.  To relax L hamstring, placed warm pack under leg.  Informed PT to remove pack during the pt's next session.  Reviewed role of OT, discussed POC and pt's goals, and ELOS. Pt resting in bed With all needs met and wife in room with pt.     Discharge Criteria: Patient will be discharged from OT if patient refuses treatment 3 consecutive times without medical reason, if treatment goals not met, if there is a change in medical status, if patient makes no progress towards goals or if patient is discharged from hospital.  The above assessment, treatment plan, treatment alternatives and goals were discussed and mutually agreed upon: by patient  [facility] 06/20/2023, 10:27 AM

## 2023-06-20 NOTE — Progress Notes (Addendum)
PROGRESS NOTE   Subjective/Complaints: Still with substantial spasticity. Asked why he wasn't given something prior to going into CT scanner. Feels that symptoms are worse since going in scanner.   ROS: Patient denies fever, rash, sore throat, blurred vision, dizziness, nausea, vomiting, diarrhea, cough, shortness of breath or chest pain, headache, or mood change.    Objective:   No results found. Recent Labs    06/18/23 0307 06/19/23 0520  WBC 8.1 9.7  HGB 10.4* 10.7*  HCT 29.8* 30.2*  PLT 373 409*   Recent Labs    06/18/23 0307 06/19/23 0520  NA 135 134*  K 4.1 3.5  CL 101 99  CO2 26 26  GLUCOSE 110* 160*  BUN 9 9  CREATININE 0.77 0.78  CALCIUM 9.4 9.6    Intake/Output Summary (Last 24 hours) at 06/20/2023 1135 Last data filed at 06/20/2023 0430 Gross per 24 hour  Intake --  Output 1500 ml  Net -1500 ml        Physical Exam: Vital Signs Blood pressure 104/68, pulse (!) 104, temperature 98.8 F (37.1 C), resp. rate 16, height 5\' 9"  (1.753 m), weight 99.8 kg, SpO2 100%. Constitutional: No distress . Vital signs reviewed. HEENT: NCAT, EOMI, oral membranes moist Neck: supple Cardiovascular: RRR without murmur. No JVD    Respiratory/Chest: CTA Bilaterally without wheezes or rales. Normal effort    GI/Abdomen: BS +, non-tender, non-distended Ext: no clubbing, cyanosis, or edema Psych: anxious but cooperative  Skin: intact Musculoskeletal:     Cervical back: Neck supple. No tenderness.     Comments: Ue's 5-/5 throughout on exam B/L RLE- HF 2-/5; KE 2-/5; DF 4-/5; and PF 4-/5 LLE- HF 2-/5; KE 2-/5; DF 2/5; PF 2/5- remains very limited b/l by spasticity  Skin:    General: Skin is warm and dry.     Comments: Incision looks OK   Neurological:     Comments: Sensory level at T4 and below- MAS of 2 of LE's and extensor tone severe- ongoing spasms seen with ROM and 5-6 beats clonus B/L LE's-       Assessment/Plan: 1. Functional deficits which require 3+ hours per day of interdisciplinary therapy in a comprehensive inpatient rehab setting. Physiatrist is providing close team supervision and 24 hour management of active medical problems listed below. Physiatrist and rehab team continue to assess barriers to discharge/monitor patient progress toward functional and medical goals  Care Tool:  Bathing    Body parts bathed by patient: Face   Body parts bathed by helper: Right arm, Left arm, Chest, Abdomen, Front perineal area, Buttocks, Right upper leg, Left upper leg, Right lower leg, Left lower leg (wife assisted pt with bath prior to OT evaluation)     Bathing assist Assist Level: Total Assistance - Patient < 25%     Upper Body Dressing/Undressing Upper body dressing   What is the patient wearing?: Pull over shirt    Upper body assist Assist Level: Minimal Assistance - Patient > 75%    Lower Body Dressing/Undressing Lower body dressing      What is the patient wearing?: Pants, Incontinence brief     Lower body assist Assist for lower body  dressing: Total Assistance - Patient < 25%     Toileting Toileting    Toileting assist Assist for toileting: 2 Helpers     Transfers Chair/bed transfer  Transfers assist     Chair/bed transfer assist level: 2 Helpers     Locomotion Ambulation   Ambulation assist              Walk 10 feet activity   Assist           Walk 50 feet activity   Assist           Walk 150 feet activity   Assist           Walk 10 feet on uneven surface  activity   Assist           Wheelchair     Assist               Wheelchair 50 feet with 2 turns activity    Assist            Wheelchair 150 feet activity     Assist          Blood pressure 104/68, pulse (!) 104, temperature 98.8 F (37.1 C), resp. rate 16, height 5\' 9"  (1.753 m), weight 99.8 kg, SpO2 100%.  Medical  Problem List and Plan: 1. Functional deficits secondary to Cervical myelopathy s/p ACDF- back after sepsis             -patient may  shower             -ELOS/Goals: 3-4 weeks- min A hopefully Grounds pass ordered -Continue CIR therapies including PT, OT  -counseled pt about why sedation wasn't used for CT/myelogram. I'm not sure another will be attempted given his anatomy. Discussed spasticity. He'll likely need a baclofen pump. 2.  Antithrombotics: -DVT/anticoagulation:  Pharmaceutical: Lovenox             -antiplatelet therapy: N/A 3. Pain Management:  MS contin 15 mg bid, Oxycodone prn. Lidoderm patch for local measures. Tylenol 650 mg qid.             --bilateral hip pain with attempts at rolling.              --on Baclofen 20 mg qid, Valium 5 mg qid with Zanaflex 4 mg qid prn for spasticity.  -12/26 Started Duloxetine 20 mg at bedtime 12/25- titrate up every 3-4 days as tolerated  -bump baclofen to 25mg  qid, consider scheduling tizanidine  -add gabapentin 100mg  tid for injury level/chest wall pain/spasticity  -reviewed CT/myelogram procedure. It was attempted but stopped due to congenital stenosis/lipomatosis 4. Mood/Behavior/Sleep: LCSW to follow for evaluation and support.              -antipsychotic agents: N/A 5. Neuropsych/cognition: This patient is capable of making decisions on his own behalf. 6. Skin/Wound Care: Routine pressure relief measures.  7. Hyponatremia: continue to monitor Na weekly  8. Klebsiella aerogenes bacteremia/sepsis /UTI: levaquin  -abx completed per ID recs--7 days 9. Neurogenic bowel: Continue Senna S 2 tabs BID. Miralax daily-->has been incontinent of bowel and question due to antibiotics and/or laxatives --Suppository daily after supper-->has refused for past 3 nights.  --would prefer enema three times a week as at home. Have suggested using mini enema  10. Neurogenic bladder: Foley was replaced on acute-->will d/c and start bladder program. (patient  would like to resume) --Monitor voiding with PVR checks             --  use coude cath and monitor for recurrent hematuria             --continue flomax  11. 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     LOS: 2 days A FACE TO FACE EVALUATION WAS PERFORMED  Ranelle Oyster 06/20/2023, 11:35 AM

## 2023-06-20 NOTE — Evaluation (Signed)
Physical Therapy Assessment and Plan  Patient Details  Name: Leon Rodriguez MRN: [id number] Date of Birth: 18-Aug-1965  PT Diagnosis: Abnormality of gait, Difficulty walking, Hypertonia, Impaired sensation, Muscle spasms, and Paraplegia Rehab Potential: Good ELOS: 3-4 weeks   Today's Date: 06/20/2023 PT Individual Time: 1100-1200 PT Individual Time Calculation (min): 60 min    Hospital Problem: Principal Problem:   Cervical myelopathy (HCC)   Past Medical History:  Past Medical History:  Diagnosis Date   Arthritis    Hip pain    Sepsis (HCC) 06/13/2023   Past Surgical History:  Past Surgical History:  Procedure Laterality Date   ANTERIOR CERVICAL DECOMP/DISCECTOMY FUSION N/A 06/05/2023   Procedure: ANTERIOR CERVICAL DISCECTOMY AND FUSION CERVICAL SIX-SEVEN/CERVICAL SEVEN-THORACIC ONE;  Surgeon: Bethann Goo, DO;  Location: [facility] OR;  Service: Neurosurgery;  Laterality: N/A;  3C   APPENDECTOMY     CIRCUMCISION     HIATAL HERNIA REPAIR     HIP SURGERY     at 1 and 57 yo    Assessment & Plan Clinical Impression: Leon Rodriguez. Settle is a 56 year old male with history of progressive BLE weakness with numbness, difficulty walking with multiple falls, bowel incontinence and spasticity for months. 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  He underwent ACDF C6-T1 by Dr. Jake Rodriguez on 08/05/2022 and postop continued to have significant weakness with lower extremity spasms.  Therapy evaluations revealed functional decline and CIR was recommended for follow-up therapy.  He was noted to have significant lower extremity spasticity with tone and baclofen was titrated upward in addition to Zanaflex and Valium.  He was started on bowel and bladder program due to issues with neurogenic bowel and bladder.  He required In-N-Out caths and developed hematuria due to traumatic catheterization.  Lovenox was held briefly and Flomax added to help with  voiding function.    Hospital course significant for fever with sinus tach secondary to sepsis on 06/12/2023.  He was pancultured, started on IV fluids and transferred to acute hospital for management.  He was found to have Klebsiella erogenous bacteremia secondary to UTI's/cystitis.  He was started on cefepime and transition to Levaquin through 12/25 to complete 7-day course antibiotic regimen. Fevers have resolved with intermittent low blood pressures. He reported issues with left knee pain and stiffness felt to be due to spasticity. Therapy has continued to work with  patient who continues to require +2 max assist with lift for tranfers and CIR recommended due to functional deficits. He was cleared tor resume intensive rehab program.   Patient transferred to CIR on 06/18/2023 .   Patient currently requires total with mobility secondary to muscle weakness and muscle joint tightness, impaired timing and sequencing, abnormal tone, and decreased coordination, and decreased sitting balance, decreased standing balance, decreased balance strategies, and difficulty maintaining precautions.  Prior to hospitalization, patient was modified independent  with mobility and lived with Spouse in a House home.  Home access is 1Stairs to enter.  Patient will benefit from skilled PT intervention to maximize safe functional mobility, minimize fall risk, and decrease caregiver burden for planned discharge home with 24 hour supervision.  Anticipate patient will benefit from follow up HH at discharge.  PT - End of Session Activity Tolerance: Tolerates 10 - 20 min activity with multiple rests Endurance Deficit: Yes PT Assessment Rehab Potential (ACUTE/IP ONLY): Good PT Barriers to Discharge: Home environment access/layout;Inaccessible home environment;Decreased caregiver support;Lack of/limited family support;Incontinence;Neurogenic Bowel & Bladder;Wound Care  PT Patient demonstrates impairments in the following area(s):  Balance;Pain;Safety;Endurance;Sensory;Motor;Skin Integrity PT Transfers Functional Problem(s): Bed Mobility;Bed to Chair;Car PT Locomotion Functional Problem(s): Ambulation;Wheelchair Mobility;Stairs PT Plan PT Intensity: Minimum of 1-2 x/day ,45 to 90 minutes PT Frequency: 5 out of 7 days PT Duration Estimated Length of Stay: 3-4 weeks PT Treatment/Interventions: Ambulation/gait training;Community reintegration;DME/adaptive equipment instruction;Neuromuscular re-education;Stair training;Psychosocial support;UE/LE Strength taining/ROM;Wheelchair propulsion/positioning;Balance/vestibular training;Discharge planning;Functional electrical stimulation;Pain management;Skin care/wound management;Therapeutic Activities;UE/LE Coordination activities;Cognitive remediation/compensation;Disease management/prevention;Splinting/orthotics;Functional mobility training;Patient/family education;Therapeutic Exercise;Visual/perceptual remediation/compensation PT Transfers Anticipated Outcome(s): mod a using LRAD PT Locomotion Anticipated Outcome(s): mod with LRAD, mod I w/c PT Recommendation Recommendations for Other Services: Neuropsych consult;Therapeutic Recreation consult Therapeutic Recreation Interventions: Stress management;Outing/community reintergration Follow Up Recommendations: Home health PT;24 hour supervision/assistance Patient destination: Home Equipment Recommended: To be determined   PT Evaluation Precautions/Restrictions Precautions Precautions: Fall;Cervical Precaution Comments: hx of many falls, severe B LE hypertonia with muscle spasms, verbally went through cervical precautions throughout session Required Braces or Orthoses: Cervical Brace Cervical Brace: Hard collar;Other (comment) (OOB) Restrictions Weight Bearing Restrictions Per Provider Order: No Other Position/Activity Restrictions: cervical precautions General   Vital Signs  Pain   Pain Interference Pain Interference Pain  Effect on Sleep: 3. Frequently Pain Interference with Therapy Activities: 3. Frequently Pain Interference with Day-to-Day Activities: 2. Occasionally Home Living/Prior Functioning Home Living Available Help at Discharge: Family;Available 24 hours/day Type of Home: House Home Access: Stairs to enter [employer] of Steps: 1 Entrance Stairs-Rails: None Home Layout: Two level;Bed/bath upstairs;1/2 bath on main level Alternate Level Stairs-Number of Steps: flight Bathroom Shower/Tub: Engineer, manufacturing systems: Standard Additional Comments: reports started using w/c in June 2024 - he uses the wheelchair downstairs for more mobility, goes downstairs 1x/day to smoke a cigar (but hasn't been downstairs in ~69month PTA), uses RW upstairs for very short distance gait to/from bathroom for BM otherwise uses urinal for bladder -- S.O. 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 position  Lives With: Spouse Prior Function Level of Independence: Requires assistive device for independence;Needs assistance with ADLs;Needs assistance with gait  Able to Take Stairs?: No (crawling up stairs) Driving: No Vocation: Full time employment Vocation Requirements: Advertising account planner Vision/Perception  Vision - History Ability to See in Adequate Light: 0 Adequate Perception Perception: Within Functional Limits Praxis Praxis: WFL  Cognition Overall Cognitive Status: Within Functional Limits for tasks assessed Orientation Level: Oriented X4 Day of Week: Correct Focused Attention: Appears intact Memory: Appears intact Awareness: Appears intact Safety/Judgment: Appears intact Sensation Sensation Light Touch: Impaired Detail Peripheral sensation comments: diminished sensation in B LEs, can feel deep touch Light Touch Impaired Details: Impaired RLE;Impaired LLE Hot/Cold: Not tested Proprioception: Impaired  Detail Proprioception Impaired Details: Impaired LLE;Impaired RLE Stereognosis: Not tested Coordination Gross Motor Movements are Fluid and Coordinated: No Fine Motor Movements are Fluid and Coordinated: No Coordination and Movement Description: impaired due to B LE muscle spasms, weakness, and sensory deficits Motor  Motor Motor: Abnormal tone;Paraplegia;Other (comment);Abnormal postural alignment and control Motor - Skilled Clinical Observations: severe B LE hypertonia with muscle spasms, B LE weakness, and sensory deficits   Trunk/Postural Assessment  Cervical Assessment Cervical Assessment: Exceptions to [facility] Cervical AROM Overall Cervical AROM: Due to precautions Lumbar Assessment Lumbar Assessment:  (limited by tone) Postural Control Postural Control: Deficits on evaluation Trunk Control: impaired and stays in a posterior pelvic tilt position due to B LE extensor tone  Balance Balance Balance Assessed: Yes Static  Sitting Balance Static Sitting - Level of Assistance: 3: Mod assist Dynamic Sitting Balance Dynamic Sitting - Balance Support: Feet supported Dynamic Sitting - Level of Assistance: 1: +1 Total assist Sitting balance - Comments: poor sitting balance, difficulty placing BLEs in comfortable position to allow for proper sitting balance Static Standing Balance Static Standing - Level of Assistance: Not tested (comment) (standing in sara lift only) Dynamic Standing Balance Dynamic Standing - Level of Assistance: Not tested (comment) Extremity Assessment  RUE Assessment Active Range of Motion (AROM) Comments: WFL General Strength Comments: 4/4 LUE Assessment Active Range of Motion (AROM) Comments: WFL General Strength Comments: 4/5 RLE Strength Right Hip Flexion: 2+/5 Right Knee Flexion: 2+/5 Right Knee Extension: 3/5 Right Ankle Dorsiflexion: 4-/5 Right Ankle Plantar Flexion: 4/5 RLE Tone RLE Tone: Severe;Other (comment) (spasms and resting tone) RLE Tone  Comments: has muscle spasms and increased tone in R LE causing his legs to scissor and adduct LLE Assessment LLE Assessment: Exceptions to [facility] Passive Range of Motion (PROM) Comments: lacks knee flexion ROM, and ankle DF ROM d/t spasticity LLE Strength Left Hip Flexion: 2-/5 Left Knee Extension: 3-/5 Left Ankle Dorsiflexion: 3/5 Left Ankle Plantar Flexion: 3/5 LLE Tone LLE Tone: Severe;Other (comment) (spasms and resting tone) LLE Tone Comments: spasms in LEs with adductor bias causing his legs to cross  Care Tool Care Tool Bed Mobility Roll left and right activity   Roll left and right assist level: Maximal Assistance - Patient 25 - 49%    Sit to lying activity   Sit to lying assist level: Maximal Assistance - Patient 25 - 49%    Lying to sitting on side of bed activity   Lying to sitting on side of bed assist level: the ability to move from lying on the back to sitting on the side of the bed with no back support.: Maximal Assistance - Patient 25 - 49%     Care Tool Transfers Sit to stand transfer   Sit to stand assist level: Dependent - Patient 0%    Chair/bed transfer   Chair/bed transfer assist level: Dependent - Patient 0%    Car transfer          Care Tool Locomotion Ambulation Ambulation activity did not occur: Safety/medical concerns (limited by extreme tone)        Walk 10 feet activity Walk 10 feet activity did not occur: Safety/medical concerns       Walk 50 feet with 2 turns activity Walk 50 feet with 2 turns activity did not occur: Safety/medical concerns      Walk 150 feet activity Walk 150 feet activity did not occur: Safety/medical concerns      Walk 10 feet on uneven surfaces activity Walk 10 feet on uneven surfaces activity did not occur: Safety/medical concerns      Stairs Stair activity did not occur: Safety/medical concerns        Walk up/down 1 step activity Walk up/down 1 step or curb (drop down) activity did not occur: Safety/medical  concerns      Walk up/down 4 steps activity Walk up/down 4 steps activity did not occur: Safety/medical concerns      Walk up/down 12 steps activity Walk up/down 12 steps activity did not occur: Safety/medical concerns      Pick up small objects from floor Pick up small object from the floor (from standing position) activity did not occur: Safety/medical concerns      Wheelchair Is the patient using a wheelchair?: Yes Type of  Wheelchair: Media planner activity did not occur: Safety/medical concerns      Wheel 50 feet with 2 turns activity Wheelchair 50 feet with 2 turns activity did not occur: Safety/medical concerns    Wheel 150 feet activity Wheelchair 150 feet activity did not occur: Safety/medical concerns      Refer to Care Plan for Long Term Goals  SHORT TERM GOAL WEEK 1 PT Short Term Goal 1 (Week 1): pt will perform Supine<>sit with mod a of 1 consistently PT Short Term Goal 2 (Week 1): Pt will perform sit<>stands using LRAD with mod A of  2 PT Short Term Goal 3 (Week 1): Pt will perform bed<>chair transfers using LRAD with mod A of 2 and +2 for safety as needed  Recommendations for other services: Neuropsych and Therapeutic Recreation  Pet therapy and Stress management  Skilled Therapeutic Intervention Evaluation completed (see details above) with patient education regarding purpose of PT evaluation, PT POC and goals, therapy schedule, weekly team meetings, and other CIR information including safety plan and fall risk safety. Pt with no pain at rest, but up to 10/10 with spasms.  Pt performed the below functional mobility tasks with the specified levels of skilled cuing and assistance.  [place] Dec + transfer to w/c>BSC with max +2. Pt then handed off to NT to complete toileting at end of session.   Mobility Bed Mobility Supine to Sit: Maximal Assistance - Patient - Patient 25-49% Transfers Transfers: Sit to Stand;Stand to Sit;Stand Pivot Transfers Sit to Stand: Dependent  - mechanical lift Stand to Sit: Dependent - mechanical lift Stand Pivot Transfers: Dependent - mechanical lift Transfer (Assistive device): Other (Comment) (sara +) Transfer via Lift Equipment: Financial risk analyst: No Gait Gait: No Stairs / Additional Locomotion Stairs: No Naval architect Mobility: No   Discharge Criteria: Patient will be discharged from PT if patient refuses treatment 3 consecutive times without medical reason, if treatment goals not met, if there is a change in medical status, if patient makes no progress towards goals or if patient is discharged from hospital.  The above assessment, treatment plan, treatment alternatives and goals were discussed and mutually agreed upon: by patient  Juluis Rainier 06/20/2023, 12:58 PM

## 2023-06-20 NOTE — Progress Notes (Signed)
Reached out to pharmacy for input on IV antibiotic regimen due to concerns of UTI with bacteremia in patient with neurogenic bladder and hardware from recent surgery. Camillia Herter reviewed chart --apparently ID pharmacist ws consulted on 12/20 and recommended 7 day course antibiotics as follow up BC negative.

## 2023-06-20 NOTE — Progress Notes (Signed)
Physical Therapy Session Note  Patient Details  Name: Leon Rodriguez MRN: [id number] Date of Birth: 1966/05/17  Today's Date: 06/20/2023 PT Individual Time: 1420-1530 PT Individual Time Calculation (min): 70 min   Short Term Goals: Week 1:  PT Short Term Goal 1 (Week 1): pt will perform Supine<>sit with mod a of 1 consistently PT Short Term Goal 2 (Week 1): Pt will perform sit<>stands using LRAD with mod A of  2 PT Short Term Goal 3 (Week 1): Pt will perform bed<>chair transfers using LRAD with mod A of 2 and +2 for safety as needed  Skilled Therapeutic Interventions/Progress Updates: Pt presented in bed sleeping but easily aroused, however initially difficult to maintain arousal. Pt agreeable to therapy. Pt c/o unrated pain primarily in LE due to spasms. PTA worked on ROM/stretching/AAROM of BLE including DF, hip flexion, and hip abd. During moments of spasm PTA applying deep pressure to ms belly (hamstrings primarily) with some relief. With +2 help pt performed supine to sit with maxA. PTA assisted with BLE management and pt was able to reach bed rail and assist with truncal support. PTA used draw sheet to correct pt's hip positioning while EOB. Pt then worked on sitting balance  with noted posterior lean as well as attaining midline. Pt performed modified "sit ups" for increased core ms recruitment and to attain midline. Pt also noted to have L lateral lean however was able to correct with cues. Pt was actually able to attain CGA for short bouts. Pt then set up in [place] (gait belt at ankles/towels between ankles and knees). Bed was then elevated and pt was able to complete x 3 stands with maxA. On second attempt PTA used sheet for additional hip extension with pt able to come to near full erect posture. Pt then returned to EOB and completed sit to supine maxA x 2 and performed dependent x 2 scoot to [facility]. Pt repositioned to comfort and left in bed at end of session with current needs met.    Therapy Documentation Precautions:  Precautions Precautions: Fall, Cervical Precaution Comments: hx of many falls, severe B LE hypertonia with muscle spasms, verbally went through cervical precautions throughout session Required Braces or Orthoses: Cervical Brace Cervical Brace: Hard collar, Other (comment) (OOB) Restrictions Weight Bearing Restrictions Per Provider Order: No Other Position/Activity Restrictions: cervical precautions General:   Vital Signs: Therapy Vitals Temp: 97.8 F (36.6 C) Pulse Rate: 89 Resp: 18 BP: 107/72 Patient Position (if appropriate): Lying Oxygen Therapy SpO2: 100 % O2 Device: Room Air Pain:   Mobility: Bed Mobility Supine to Sit: Maximal Assistance - Patient - Patient 25-49% Transfers Transfers: Sit to Stand;Stand to Sit;Stand Pivot Transfers Sit to Stand: Dependent - mechanical lift Stand to Sit: Dependent - mechanical lift Stand Pivot Transfers: Dependent - mechanical lift Transfer (Assistive device): Other (Comment) (sara +) Transfer via Lift Equipment: Engineering geologist : Gait Ambulation: No Gait Gait: No Stairs / Additional Locomotion Stairs: No Corporate treasurer: No  Trunk/Postural Assessment : Cervical AROM Overall Cervical AROM: Due to precautions Lumbar Assessment Lumbar Assessment:  (limited by tone) Postural Control Postural Control: Deficits on evaluation Trunk Control: impaired and stays in a posterior pelvic tilt position due to B LE extensor tone  Balance: Balance Balance Assessed: Yes Static Sitting Balance Static Sitting - Level of Assistance: 3: Mod assist Dynamic Sitting Balance Dynamic Sitting - Balance Support: Feet supported Dynamic Sitting - Level of Assistance: 1: +1 Total assist Static Standing Balance Static Standing -  Level of Assistance: Not tested (comment) (standing in sara lift only) Dynamic Standing Balance Dynamic Standing - Level of Assistance: Not tested  (comment) Exercises:   Other Treatments:      Therapy/Group: Individual Therapy  Vestal Markin 06/20/2023, 4:02 PM

## 2023-06-20 NOTE — Plan of Care (Signed)
  Problem: RH Balance Goal: LTG: Patient will maintain dynamic sitting balance (OT) Description: LTG:  Patient will maintain dynamic sitting balance with assistance during activities of daily living (OT) Flowsheets (Taken 06/20/2023 1042) LTG: Pt will maintain dynamic sitting balance during ADLs with: Supervision/Verbal cueing Goal: LTG Patient will maintain dynamic standing with ADLs (OT) Description: LTG:  Patient will maintain dynamic standing balance with assist during activities of daily living (OT)  Flowsheets (Taken 06/20/2023 1042) LTG: Pt will maintain dynamic standing balance during ADLs with: Moderate Assistance - Patient 50 - 74%   Problem: Sit to Stand Goal: LTG:  Patient will perform sit to stand in prep for activites of daily living with assistance level (OT) Description: LTG:  Patient will perform sit to stand in prep for activites of daily living with assistance level (OT) Flowsheets (Taken 06/20/2023 1042) LTG: PT will perform sit to stand in prep for activites of daily living with assistance level: Moderate Assistance - Patient 50 - 74%   Problem: RH Bathing Goal: LTG Patient will bathe all body parts with assist levels (OT) Description: LTG: Patient will bathe all body parts with assist levels (OT) Flowsheets (Taken 06/20/2023 1042) LTG: Pt will perform bathing with assistance level/cueing: Minimal Assistance - Patient > 75%   Problem: RH Dressing Goal: LTG Patient will perform upper body dressing (OT) Description: LTG Patient will perform upper body dressing with assist, with/without cues (OT). Flowsheets (Taken 06/20/2023 1042) LTG: Pt will perform upper body dressing with assistance level of: Supervision/Verbal cueing Goal: LTG Patient will perform lower body dressing w/assist (OT) Description: LTG: Patient will perform lower body dressing with assist, with/without cues in positioning using equipment (OT) Flowsheets (Taken 06/20/2023 1042) LTG: Pt will perform  lower body dressing with assistance level of: Moderate Assistance - Patient 50 - 74%   Problem: RH Toileting Goal: LTG Patient will perform toileting task (3/3 steps) with assistance level (OT) Description: LTG: Patient will perform toileting task (3/3 steps) with assistance level (OT)  Flowsheets (Taken 06/20/2023 1042) LTG: Pt will perform toileting task (3/3 steps) with assistance level: Moderate Assistance - Patient 50 - 74%   Problem: RH Toilet Transfers Goal: LTG Patient will perform toilet transfers w/assist (OT) Description: LTG: Patient will perform toilet transfers with assist, with/without cues using equipment (OT) Flowsheets (Taken 06/20/2023 1042) LTG: Pt will perform toilet transfers with assistance level of: Moderate Assistance - Patient 50 - 74%

## 2023-06-20 NOTE — Care Management (Signed)
[facility]patient [facility] Individual Statement of Services  Patient Name:  Leon Rodriguez  Date:  06/20/2023  Welcome to the Inpatient Rehabilitation Center.  Our goal is to provide you with an individualized program based on your diagnosis and situation, designed to meet your specific needs.  With this comprehensive rehabilitation program, you will be expected to participate in at least 3 hours of rehabilitation therapies Monday-Friday, with modified therapy programming on the weekends.  Your rehabilitation program will include the following services:  Physical Therapy (PT), Occupational Therapy (OT), 24 hour per day rehabilitation nursing, Therapeutic Recreaction (TR), Psychology, Neuropsychology, Care Coordinator, Rehabilitation Medicine, Nutrition Services, Pharmacy Services, and Other  Weekly team conferences will be held on Tuesdays to discuss your progress.  Your Inpatient Rehabilitation Care Coordinator will talk with you frequently to get your input and to update you on team discussions.  Team conferences with you and your family in attendance may also be held.  Expected length of stay: 21-30 days  Overall anticipated outcome: Moderate Assistance  Depending on your progress and recovery, your program may change. Your Inpatient Rehabilitation Care Coordinator will coordinate services and will keep you informed of any changes. Your Inpatient Rehabilitation Care Coordinator's name and contact numbers are listed  below.  The following services may also be recommended but are not provided by the Inpatient Rehabilitation Center:  Driving Evaluations Home Health Rehabiltiation Services Outpatient Rehabilitation Services Vocational Rehabilitation   Arrangements will be made to provide these services after discharge if needed.  Arrangements include referral to agencies that provide these services.  Your insurance has been verified to be:  [place] Medicaid [employer]  Your primary  doctor is:  Kathleen Lime  Pertinent information will be shared with your doctor and your insurance company.  Inpatient Rehabilitation Care Coordinator:  Susie Cassette [phone] or [phone]  Information discussed with and copy given to patient by: Gretchen Short, 06/20/2023, 2:48 PM

## 2023-06-20 NOTE — Progress Notes (Signed)
Orthopedic Tech Progress Note Patient Details:  Leon Rodriguez 08/28/1965 [id number]  Bilateral prafo's delivered to bedside as we were told they were for use at night.  Ortho Devices Type of Ortho Device: Prafo boot/shoe Ortho Device/Splint Location: at bedside Ortho Device/Splint Interventions: Ordered   Post Interventions Instructions Provided: Care of device, Adjustment of device  Leon Rodriguez Carmine Savoy 06/20/2023, 12:29 PM

## 2023-06-21 DIAGNOSIS — R252 Cramp and spasm: Secondary | ICD-10-CM | POA: Diagnosis not present

## 2023-06-21 DIAGNOSIS — G959 Disease of spinal cord, unspecified: Secondary | ICD-10-CM | POA: Diagnosis not present

## 2023-06-21 DIAGNOSIS — M792 Neuralgia and neuritis, unspecified: Secondary | ICD-10-CM | POA: Diagnosis not present

## 2023-06-21 DIAGNOSIS — N319 Neuromuscular dysfunction of bladder, unspecified: Secondary | ICD-10-CM | POA: Diagnosis not present

## 2023-06-21 LAB — GLUCOSE, CAPILLARY: Glucose-Capillary: 127 mg/dL — ABNORMAL HIGH (ref 70–99)

## 2023-06-21 MED ORDER — MELATONIN 5 MG PO TABS
5.0000 mg | ORAL_TABLET | Freq: Every day | ORAL | Status: DC
  Administered 2023-06-21 – 2023-06-29 (×9): 5 mg via ORAL
  Filled 2023-06-21 (×9): qty 1

## 2023-06-21 MED ORDER — TRAZODONE HCL 50 MG PO TABS
50.0000 mg | ORAL_TABLET | Freq: Every day | ORAL | Status: DC
  Administered 2023-06-21 – 2023-06-26 (×6): 50 mg via ORAL
  Filled 2023-06-21 (×6): qty 1

## 2023-06-21 MED ORDER — DULOXETINE HCL 30 MG PO CPEP
30.0000 mg | ORAL_CAPSULE | Freq: Every day | ORAL | Status: DC
  Administered 2023-06-22 – 2023-06-30 (×9): 30 mg via ORAL
  Filled 2023-06-21 (×9): qty 1

## 2023-06-21 NOTE — IPOC Note (Signed)
Overall Plan of Care [facility]) Patient Details Name: Leon Rodriguez MRN: [id number] DOB: 31-Aug-1965  Admitting Diagnosis: Cervical myelopathy [facility])  Hospital Problems: Principal Problem:   Cervical myelopathy (HCC)     Functional Problem List: Nursing Pain, Bowel, Bladder, Safety, Endurance, Medication Management  PT Balance, Pain, Safety, Endurance, Sensory, Motor, Skin Integrity  OT Balance, Endurance, Motor, Pain, Sensory  SLP    TR         Basic ADL's: OT Bathing, Dressing, Toileting     Advanced  ADL's: OT       Transfers: PT Bed Mobility, Bed to Chair, Car  OT Toilet     Locomotion: PT Ambulation, Psychologist, prison and probation services, Stairs     Additional Impairments: OT None  SLP        TR      Anticipated Outcomes Item Anticipated Outcome  Self Feeding independent  Swallowing      Basic self-care  set up UB self care, mod A LB self care  Toileting  Mod A   Bathroom Transfers Mod A to toilet  Bowel/Bladder  manage bowel w mod I  Transfers  mod a using LRAD  Locomotion  mod with LRAD, mod I w/c  Communication     Cognition     Pain  < 4 with prns  Safety/Judgment  manage w cues   Therapy Plan: PT Intensity: Minimum of 1-2 x/day ,45 to 90 minutes PT Frequency: 5 out of 7 days PT Duration Estimated Length of Stay: 3-4 weeks OT Intensity: Minimum of 1-2 x/day, 45 to 90 minutes OT Frequency: 5 out of 7 days OT Duration/Estimated Length of Stay: 28-30 days     Team Interventions: Nursing Interventions Patient/Family Education, Disease Management/Prevention, Discharge Planning, Bladder Management, Pain Management, Bowel Management, Medication Management, Skin Care/Wound Management  PT interventions Ambulation/gait training, Community reintegration, DME/adaptive equipment instruction, Neuromuscular re-education, Museum/gallery curator, Psychosocial support, UE/LE Strength taining/ROM, Wheelchair propulsion/positioning, Warden/ranger, Discharge  planning, Functional electrical stimulation, Pain management, Skin care/wound management, Therapeutic Activities, UE/LE Coordination activities, Cognitive remediation/compensation, Disease management/prevention, Splinting/orthotics, Functional mobility training, Patient/family education, Therapeutic Exercise, Visual/perceptual remediation/compensation  OT Interventions DME/adaptive equipment instruction, Therapeutic Activities, UE/LE Strength taining/ROM, Therapeutic Exercise, Self Care/advanced ADL retraining, Warden/ranger, UE/LE Coordination activities, Patient/family education, Psychosocial support, Functional mobility training, Discharge planning, Neuromuscular re-education, Pain management  SLP Interventions    TR Interventions    SW/CM Interventions Discharge Planning, Psychosocial Support, Patient/Family Education   Barriers to Discharge MD  Medical stability and spasticity  Nursing Decreased caregiver support, Home environment access/layout 2 level 2 ste 1/2 ba on main, B+B up 16 steps w S.O. off FMLA thru mid January, then he will be solo; hx of falls PTA  PT Home environment access/layout, Inaccessible home environment, Decreased caregiver support, Lack of/limited family support, Incontinence, Neurogenic Bowel & Bladder, Wound Care    OT Home environment access/layout, Other (comments) severe LE spasticity will make caregiving challenging for his family to mobilize him  SLP      SW Decreased caregiver support, Lack of/limited family support, Community education officer for SNF coverage     Team Discharge Planning: Destination: PT-Home ,OT- Home , SLP-  Projected Follow-up: PT-Home health PT, 24 hour supervision/assistance, OT-  Home health OT, SLP-  Projected Equipment Needs: PT-To be determined, OT- 3 in 1 bedside comode, SLP-  Equipment Details: PT- , OT-  Patient/family involved in discharge planning: PT- Patient, Family member/caregiver,  OT-Patient, Family member/caregiver, SLP-    MD ELOS: 3-4 weeks Medical Rehab Prognosis:  Good  Assessment: The patient has been admitted for CIR therapies with the diagnosis of 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 The team will be addressing functional mobility, strength, stamina, balance, safety, adaptive techniques and equipment, self-care, bowel and bladder mgt, patient and caregiver education, NMR, spasticity mgt, pain control, community reentry. Goals have been set at mod assist with self-care and mobility. . Anticipated discharge destination is home with wife.        See Team Conference Notes for weekly updates to the plan of care

## 2023-06-21 NOTE — Plan of Care (Signed)
  Problem: Consults Goal: RH SPINAL CORD INJURY PATIENT EDUCATION Description:  See Patient Education module for education specifics.  Outcome: Progressing   Problem: SCI BOWEL ELIMINATION Goal: RH STG MANAGE BOWEL WITH ASSISTANCE Description: STG Manage Bowel with toileting Assistance. Outcome: Progressing Goal: RH STG SCI MANAGE BOWEL WITH MEDICATION WITH ASSISTANCE Description: STG SCI Manage bowel with medication with mod I assistance. Outcome: Progressing   Problem: SCI BLADDER ELIMINATION Goal: RH STG MANAGE BLADDER WITH ASSISTANCE Description: STG Manage Bladder With toileting Assistance Outcome: Progressing   Problem: RH SKIN INTEGRITY Goal: RH STG SKIN FREE OF INFECTION/BREAKDOWN Description: Manage w min assist Outcome: Progressing   Problem: RH SAFETY Goal: RH STG ADHERE TO SAFETY PRECAUTIONS W/ASSISTANCE/DEVICE Description: STG Adhere to Safety Precautions With cues  Assistance/Device. Outcome: Progressing   Problem: RH PAIN MANAGEMENT Goal: RH STG PAIN MANAGED AT OR BELOW PT'S PAIN GOAL Description: < 4 with prns Outcome: Progressing   Problem: RH KNOWLEDGE DEFICIT SCI Goal: RH STG INCREASE KNOWLEDGE OF SELF CARE AFTER SCI Description: Patient and S.O. will able to manage care using educational resources for medications and dietary modifications independently Outcome: Progressing

## 2023-06-21 NOTE — Progress Notes (Signed)
Occupational Therapy Session Note  Patient Details  Name: Leon Rodriguez MRN: [id number] Date of Birth: Dec 01, 1965  Today's Date: 06/21/2023 OT Individual Time: 0300-0325 OT Individual Time Calculation (min): 25 min    Short Term Goals: Week 2:  OT Short Term Goal 1 (Week 2): Pt will be able to tolerate sitting EOB with min A and no back support to wash UB with min A. OT Short Term Goal 2 (Week 2): Pt will be able to don a shirt with min A sitting EOB with no back support. OT Short Term Goal 3 (Week 2): Pt will be able wash LE from bed level with mod A. OT Short Term Goal 4 (Week 2): pt will be able to rise to stand in a standard stedy lift with max A of 2.  Skilled Therapeutic Interventions/Progress Updates:  Pt seen for skilled OT session. + 2 support for safe transfer using maximove from TIS w/c back to bed. Pt reported 7/10 overall pain down from 9/10 reported earlier. Once back in bed, strong focus on tone reduction/reflex inhibitory positioning as well skin protection with family present for education. Once LE's positioned, pt able to use bed features and rails to assist with Ub clothing management and positioning to allow nursing to apply pain patch. Pt able to use reacher as UE cane for therex for scap, sh, elbows 12 reps x 3 sets all within spinal precaution ranges. Left with HOB elevated and in close to as upright seated position in bed with exit engaged, needs and nurse call button in reach.   Therapy Documentation Precautions:  Precautions Precautions: Fall, Cervical Precaution Comments: hx of many falls, severe B LE hypertonia with muscle spasms, verbally went through cervical precautions throughout session Required Braces or Orthoses: Cervical Brace Cervical Brace: Hard collar, Other (comment) (OOB) Restrictions Weight Bearing Restrictions Per Provider Order: No Other Position/Activity Restrictions: cervical precautions   Therapy/Group: Individual Therapy  Vicenta Dunning 06/21/2023, 3:44 PM

## 2023-06-21 NOTE — Progress Notes (Signed)
Physical Therapy Session Note  Patient Details  Name: Leon Rodriguez MRN: [id number] Date of Birth: 1965-12-10  Today's Date: 06/21/2023 PT Individual Time: 9528-4132 + 1128-1209 PT Individual Time Calculation (min): 58 min + 41 min  Short Term Goals: Week 1:  PT Short Term Goal 1 (Week 1): pt will perform Supine<>sit with mod a of 1 consistently PT Short Term Goal 2 (Week 1): Pt will perform sit<>stands using LRAD with mod A of  2 PT Short Term Goal 3 (Week 1): Pt will perform bed<>chair transfers using LRAD with mod A of 2 and +2 for safety as needed  Skilled Therapeutic Interventions/Progress Updates:    SESSION 1: Pt presents in room in bed, lethargic, follows commands but unable to maintain eyes open throughout session. Pt denies pain and agreeable to trial OOB with maximove transfer and TIS WC. Session focused on therapeutic activities to provide appropriate equipment, education on equipment, bed mobility training and education on skin protection and cervical precautions.  Pt noted to have drainage coming from L foot upon further assessment therapist notices blister on L lateral heel. RN notified and present during session for taking photos and dressing, during which time therapist assist for managing LLE secondary to tone, and therapist provides education to pt and pt wife on skin protection. Pt requires max assist for donning pants in supine due to significant BLE tone and spasms, pt able to manage rolling bilaterally with min assist, hospital bed rails and verbal cues. Therapist provides TIS WC and skin protection cushion to promote improved positioning in upright, decrease skin breakdown, and allow for repositioning in WC. Pt completes rolling bilaterally for placement of maximove pad however noted to need new one due to incorrect type needed for maxi move, therapist provides new one. Pt then tolerates total assist transfer +2 from bed to WC. Pt positioned in WC to maximize comfort and  positioning due to pt demonstrating truncal tone with L lateral flexion noted.  Pt remains seated in [facility] with all needs within reach, call light in place, pt wife present at bedside at end of session. Pt wife educated on TIS WC parts management with pt wife verbalizing understanding.   SESSION 2: Pt presents in room in [facility] at scheduled time, demonstrating increased pain due to BLE spasms, cold due to spilled coffee on pants, requesting to return to bed. Session focused on therapeutic activity for transfer training with education on transfer method, education for safety awareness, and therapeutic exercise for BLE stretching with modalities.  Pt tolerates total assist transfer via maximove assist +2 WC to bed requires min assist and cues for rolling bilaterlaly for removing maximove pad. At this time nursing comes in to complete scheduled I&O cath, nursing care for blood blister, and nursing care for managing incontinent BM, pt missing 19 minutes out of 60 minute session. Therapist reenters room at end of nursing care, provides min assist for rolling for donning pants with cues provided to pt for reaching for hospital bed rails. Pt assisted towards HOB with totalAx2.  Pt then participates with AAROM for BLE ROM and to decrease spasticity, moist heat applied to unilateral hamstring during opposite LE stretching including: - hip abduction BLE AAROM with overpressure 3x60 seconds - hip flexion RLE AAROM with overpressure 3x60 seconds (cues for controlled lowering of RLE) - hamstring stretch BLE 3x60 seconds  Pt requires frequent cues for attention to task with pt easily distractible. Pt provided with education on deficits with pt demonstrating decreased insight into  deficits, asking to leave to go to [facility].  Pt remains in supine with all needs within reach, call light in place, pt wife at bedside and bed alarm activated at end of session.  Therapy Documentation Precautions:   Precautions Precautions: Fall, Cervical Precaution Comments: hx of many falls, severe B LE hypertonia with muscle spasms, verbally went through cervical precautions throughout session Required Braces or Orthoses: Cervical Brace Cervical Brace: Hard collar, Other (comment) (OOB) Restrictions Weight Bearing Restrictions Per Provider Order: No Other Position/Activity Restrictions: cervical precautions General: PT Amount of Missed Time (min): 19 Minutes (nursing care to address blister on heel, required I&O cath, and incontinent bowel movement) PT Missed Treatment Reason: Bowel/bladder accident;Nursing care   Therapy/Group: Individual Therapy  Edwin Cap PT, DPT 06/21/2023, 12:12 PM

## 2023-06-21 NOTE — Progress Notes (Signed)
Physical Therapy Session Note  Patient Details  Name: Leon Rodriguez MRN: [id number] Date of Birth: 10/02/1965  Today's Date: 06/21/2023 PT Individual Time: 1303-1410 PT Individual Time Calculation (min): 67 min   Short Term Goals: Week 1:  PT Short Term Goal 1 (Week 1): pt will perform Supine<>sit with mod a of 1 consistently PT Short Term Goal 2 (Week 1): Pt will perform sit<>stands using LRAD with mod A of  2 PT Short Term Goal 3 (Week 1): Pt will perform bed<>chair transfers using LRAD with mod A of 2 and +2 for safety as needed  Skilled Therapeutic Interventions/Progress Updates: Pt presented in bed with wife present agreeable to therapy. Pt c/o pain only with spasms which pt indicated he hasn't had in approx 30 min. Pt agreeable to transfer to TIS. Pt completed rolling L/R with minA to allow for placement of sling. Pt completed dependent transfer via Maxi Sky to TIS. Pt required increased time due to frequent spasms in BLE to complete transfer safely. Pt then agreeable to try standing frame for tone management. Pt transferred to day room and set up in standing frame. PTA placed pillow in front of plate at foot for comfort and rolled towel between ankles in case of spasm. Pt was able to scoot hips anteriorly pulling on frame of standing frame with increased effort and minA. Pt was then able to stand dependently in standing frame for ~10 min with no significant spasms noted. Pt did indicate increased back soreness with increased time due to fatigue. Pt noted to stand in mostly forward flexed posture but was able to push against platform to improve posture intermittently. Pt returned to TIS and PTA was able to place LLE onto leg rest without spasm and RLE onto leg rest with only mild spasm. Pt transported back to room and pt's wife requesting pt remain in TIS at end of session. PTA placed pillow between legs for spasticity management. Pt left in TIS with call bell within reach and family present.       Therapy Documentation Precautions:  Precautions Precautions: Fall, Cervical Precaution Comments: hx of many falls, severe B LE hypertonia with muscle spasms, verbally went through cervical precautions throughout session Required Braces or Orthoses: Cervical Brace Cervical Brace: Hard collar, Other (comment) (OOB) Restrictions Weight Bearing Restrictions Per Provider Order: No Other Position/Activity Restrictions: cervical precautions General:   Vital Signs: Therapy Vitals Temp: 98 F (36.7 C) Pulse Rate: 78 Resp: 17 BP: 93/67 Patient Position (if appropriate): Sitting Oxygen Therapy SpO2: 98 % O2 Device: Room Air     Therapy/Group: Individual Therapy  [name] 06/21/2023, 4:39 PM

## 2023-06-21 NOTE — Progress Notes (Signed)
Per MD order- to do bowel program every other day without dig-stim. Not due today, will be due tomorrow 06/22/23.     Tilden Dome, LPN

## 2023-06-21 NOTE — Progress Notes (Addendum)
PROGRESS NOTE   Subjective/Complaints: Slept better last night. Given melatonin and trazodone after 10. Took tizanidine, too, at 0100. Sleepy this morning. Spasticity remains an issue. Developed a left heel blood blister which ruptured.    ROS: Limited due to cognitive/behavioral .    Objective:   No results found. Recent Labs    06/19/23 0520  WBC 9.7  HGB 10.7*  HCT 30.2*  PLT 409*   Recent Labs    06/19/23 0520  NA 134*  K 3.5  CL 99  CO2 26  GLUCOSE 160*  BUN 9  CREATININE 0.78  CALCIUM 9.6    Intake/Output Summary (Last 24 hours) at 06/21/2023 1123 Last data filed at 06/21/2023 0800 Gross per 24 hour  Intake 480 ml  Output 973 ml  Net -493 ml        Physical Exam: Vital Signs Blood pressure 106/69, pulse 93, temperature 97.8 F (36.6 C), temperature source Oral, resp. rate 17, height 5\' 9"  (1.753 m), weight 99.8 kg, SpO2 98%. Constitutional: No distress . Vital signs reviewed. HEENT: NCAT, EOMI, oral membranes moist Neck: supple Cardiovascular: RRR without murmur. No JVD    Respiratory/Chest: CTA Bilaterally without wheezes or rales. Normal effort    GI/Abdomen: BS +, non-tender, non-distended Ext: no clubbing, cyanosis, or edema Psych: somnolent  Skin: intact Musculoskeletal:     Cervical back: Neck supple. No tenderness.     Comments: Ue's 5-/5 throughout on exam B/L RLE- HF 2-/5; KE 2-/5; DF 4-/5; and PF 4-/5 LLE- HF 2-/5; KE 2-/5; DF 2/5; PF 2/5- still limited by spasticity  Skin:    General: Skin is warm and dry.     Comments: Incision looks OK   Neurological:     Comments: Sensory level at T4 and below- MAS of 2 of LE's and extensor tone severe- ongoing spasms seen with ROM and 5-6 beats clonus B/L LE's-      Assessment/Plan: 1. Functional deficits which require 3+ hours per day of interdisciplinary therapy in a comprehensive inpatient rehab setting. Physiatrist is providing  close team supervision and 24 hour management of active medical problems listed below. Physiatrist and rehab team continue to assess barriers to discharge/monitor patient progress toward functional and medical goals  Care Tool:  Bathing    Body parts bathed by patient: Face   Body parts bathed by helper: Right arm, Left arm, Chest, Abdomen, Front perineal area, Buttocks, Right upper leg, Left upper leg, Right lower leg, Left lower leg (wife assisted pt with bath prior to OT evaluation)     Bathing assist Assist Level: Total Assistance - Patient < 25%     Upper Body Dressing/Undressing Upper body dressing   What is the patient wearing?: Pull over shirt    Upper body assist Assist Level: Minimal Assistance - Patient > 75%    Lower Body Dressing/Undressing Lower body dressing      What is the patient wearing?: Pants, Incontinence brief     Lower body assist Assist for lower body dressing: Total Assistance - Patient < 25%     Toileting Toileting    Toileting assist Assist for toileting: 2 Helpers     Transfers Chair/bed  transfer  Transfers assist     Chair/bed transfer assist level: Dependent - Patient 0%     Locomotion Ambulation   Ambulation assist   Ambulation activity did not occur: Safety/medical concerns (limited by extreme tone)          Walk 10 feet activity   Assist  Walk 10 feet activity did not occur: Safety/medical concerns        Walk 50 feet activity   Assist Walk 50 feet with 2 turns activity did not occur: Safety/medical concerns         Walk 150 feet activity   Assist Walk 150 feet activity did not occur: Safety/medical concerns         Walk 10 feet on uneven surface  activity   Assist Walk 10 feet on uneven surfaces activity did not occur: Safety/medical concerns         Wheelchair     Assist Is the patient using a wheelchair?: Yes Type of Wheelchair: Manual Wheelchair activity did not occur:  Safety/medical concerns         Wheelchair 50 feet with 2 turns activity    Assist    Wheelchair 50 feet with 2 turns activity did not occur: Safety/medical concerns       Wheelchair 150 feet activity     Assist  Wheelchair 150 feet activity did not occur: Safety/medical concerns       Blood pressure 106/69, pulse 93, temperature 97.8 F (36.6 C), temperature source Oral, resp. rate 17, height 5\' 9"  (1.753 m), weight 99.8 kg, SpO2 98%.  Medical Problem List and Plan: 1. Functional deficits secondary to Cervical myelopathy s/p ACDF- back after sepsis             -patient may  shower             -ELOS/Goals: 3-4 weeks- min A hopefully Grounds pass ordered -Continue CIR therapies including PT, OT  -again counseled pt about why CT myelo wasn't performed. Discussed spasticity. He'll likely need a baclofen pump. 2.  Antithrombotics: -DVT/anticoagulation:  Pharmaceutical: Lovenox             -antiplatelet therapy: N/A 3. Pain Management:  MS contin 15 mg bid, Oxycodone prn. Lidoderm patch for local measures. Tylenol 650 mg qid.             --bilateral hip pain with attempts at rolling.              --on Baclofen 20 mg qid, Valium 5 mg qid with Zanaflex 4 mg qid prn for spasticity.  -12/27 Started Duloxetine 20 mg at bedtime 12/25- titrate up every 3-4 days as tolerated  -bumped baclofen to 25mg  qid, consider scheduling tizanidine  -added gabapentin 100mg  tid for injury level/chest wall pain/spasticity  -may not be able to go much higher given somnolence.  4. Mood/Behavior/Sleep: LCSW to follow for evaluation and support.              -antipsychotic agents: N/A  -12/27--trazodone and melatonin helpful. Schedule earlier. 5. Neuropsych/cognition: This patient is capable of making decisions on his own behalf. 6. Skin/Wound Care: Routine pressure relief measures.   -has developed left heel sore d/t tone, struggles to fit in [facility]  -heel dressed, ordered prevalon boots so  that he's better able to tolerate wear.  7. Hyponatremia: continue to monitor Na weekly  8. Klebsiella aerogenes bacteremia/sepsis /UTI: levaquin  -abx completed per ID recs--7 days 9. Neurogenic bowel: Continue Senna S 2 tabs BID. Miralax  daily-->has been incontinent of bowel and question due to antibiotics and/or laxatives --Suppository daily after supper-->has refused for past 3 nights.  --would prefer enema three times a week as at home. Have suggested using mini enema  10. Neurogenic bladder: Foley was replaced on acute-->will d/c and start bladder program. (patient would like to resume) --Monitor voiding with PVR checks             --use coude cath and monitor for recurrent hematuria             --continue flomax  11. 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

## 2023-06-22 DIAGNOSIS — R252 Cramp and spasm: Secondary | ICD-10-CM | POA: Diagnosis not present

## 2023-06-22 DIAGNOSIS — M792 Neuralgia and neuritis, unspecified: Secondary | ICD-10-CM | POA: Diagnosis not present

## 2023-06-22 DIAGNOSIS — N319 Neuromuscular dysfunction of bladder, unspecified: Secondary | ICD-10-CM | POA: Diagnosis not present

## 2023-06-22 DIAGNOSIS — G959 Disease of spinal cord, unspecified: Secondary | ICD-10-CM | POA: Diagnosis not present

## 2023-06-22 MED ORDER — BACLOFEN 10 MG PO TABS
30.0000 mg | ORAL_TABLET | Freq: Four times a day (QID) | ORAL | Status: DC
  Administered 2023-06-22 – 2023-07-12 (×80): 30 mg via ORAL
  Filled 2023-06-22 (×80): qty 3

## 2023-06-22 NOTE — Progress Notes (Signed)
Requested to have bowel program performed Later tonight. Notified [address], [place].    Tilden Dome, LPN

## 2023-06-22 NOTE — Progress Notes (Signed)
Staples removed from neck. Patient tolerated well. No S/S of infection. No bleeding.     Tilden Dome, LPN

## 2023-06-22 NOTE — Plan of Care (Signed)
  Problem: Consults Goal: RH SPINAL CORD INJURY PATIENT EDUCATION Description:  See Patient Education module for education specifics.  Outcome: Progressing   Problem: SCI BOWEL ELIMINATION Goal: RH STG MANAGE BOWEL WITH ASSISTANCE Description: STG Manage Bowel with toileting Assistance. Outcome: Progressing Goal: RH STG SCI MANAGE BOWEL WITH MEDICATION WITH ASSISTANCE Description: STG SCI Manage bowel with medication with mod I assistance. Outcome: Progressing   Problem: SCI BLADDER ELIMINATION Goal: RH STG MANAGE BLADDER WITH ASSISTANCE Description: STG Manage Bladder With toileting Assistance Outcome: Progressing   Problem: RH SKIN INTEGRITY Goal: RH STG SKIN FREE OF INFECTION/BREAKDOWN Description: Manage w min assist Outcome: Progressing   Problem: RH SAFETY Goal: RH STG ADHERE TO SAFETY PRECAUTIONS W/ASSISTANCE/DEVICE Description: STG Adhere to Safety Precautions With cues  Assistance/Device. Outcome: Progressing   Problem: RH PAIN MANAGEMENT Goal: RH STG PAIN MANAGED AT OR BELOW PT'S PAIN GOAL Description: < 4 with prns Outcome: Progressing   Problem: RH KNOWLEDGE DEFICIT SCI Goal: RH STG INCREASE KNOWLEDGE OF SELF CARE AFTER SCI Description: Patient and S.O. will able to manage care using educational resources for medications and dietary modifications independently Outcome: Progressing

## 2023-06-22 NOTE — Progress Notes (Signed)
Physical Therapy Session Note  Patient Details  Name: Leon Rodriguez MRN: [id number] Date of Birth: Jan 18, 1966  Today's Date: 06/22/2023 PT Individual Time: 4098-1191 PT Individual Time Calculation (min): 30 min   Short Term Goals: Week 1:  PT Short Term Goal 1 (Week 1): pt will perform Supine<>sit with mod a of 1 consistently PT Short Term Goal 2 (Week 1): Pt will perform sit<>stands using LRAD with mod A of  2 PT Short Term Goal 3 (Week 1): Pt will perform bed<>chair transfers using LRAD with mod A of 2 and +2 for safety as needed  Skilled Therapeutic Interventions/Progress Updates:  Pt was seen bedside in the pm. Pt out of bed to tilt in space w/c. Pt transferred w/c to bed with maximove. Pt moved up in bed with total A. Pt rolled R with max A and verbal cues. Performed static stretching B LEs. Pt's nurse at bedside with nurse tech to perform straight catheterization.   Therapy Documentation Precautions:  Precautions Precautions: Fall, Cervical Precaution Comments: hx of many falls, severe B LE hypertonia with muscle spasms, verbally went through cervical precautions throughout session Required Braces or Orthoses: Cervical Brace Cervical Brace: Hard collar, Other (comment) (OOB) Restrictions Weight Bearing Restrictions Per Provider Order: No Other Position/Activity Restrictions: cervical precautions General: PT Amount of Missed Time (min): 15 Minutes PT Missed Treatment Reason: Nursing care Vital Signs:   Pain: No c/o pain, just stiffness.   Therapy/Group: Individual Therapy  [name] 06/22/2023, 1:40 PM

## 2023-06-22 NOTE — Progress Notes (Signed)
MD aware of patient refusing gabapentin. Patient educated on purpose but states he would rather not take it with all the other medications he has been prescribed and also he states he doesn't want to take due to insurance purposes.     Tilden Dome, LPN

## 2023-06-22 NOTE — Progress Notes (Signed)
Occupational Therapy Session Note  Patient Details  Name: Leon Rodriguez MRN: [id number] Date of Birth: Sep 12, 1965  Today's Date: 06/22/2023 OT Individual Time: 0900-1010 OT Individual Time Calculation (min): 70 min    Short Term Goals: Week 1:     Skilled Therapeutic Interventions/Progress Updates:    Pt sleeping upon arrival. Mod multimodal cues to arouse. Initial focus on LB extensor tone mgmt including gentle PROM and stretching. Rolling in bed with mod A to facilitate donning shorts and socks. Ongoing spasms requiring gentle PROM/stretching for relief. Pan per below. Rolling R/L in bed with mod A for placement of MaxiMove sling. Dependent transfer to TIS w/c. With aligned positioning in w/c pt did not exhibit extensor tone. W/c modifications to prevent skin breakdown at Bil feet. BLE PROM/stretching in w/c for tone mgmt. Pt remained in w/c with wife present.   Therapy Documentation Precautions:  Precautions Precautions: Fall, Cervical Precaution Comments: hx of many falls, severe B LE hypertonia with muscle spasms, verbally went through cervical precautions throughout session Required Braces or Orthoses: Cervical Brace Cervical Brace: Hard collar, Other (comment) (OOB) Restrictions Weight Bearing Restrictions Per Provider Order: No Other Position/Activity Restrictions: cervical precautions Pain: Pain Assessment Faces Pain Scale: Hurts even more Pt c/o intermittent pain around abdominal/hip area, "like a corsett" with spasms; gentle stretching and MFR with relief noted.   Therapy/Group: Individual Therapy  Rich Brave 06/22/2023, 10:18 AM

## 2023-06-22 NOTE — Progress Notes (Signed)
Pt refused scheduled enema stating the enema is to be given every two days and per pt he received one on 12/27.

## 2023-06-22 NOTE — Progress Notes (Signed)
Physical Therapy Session Note  Patient Details  Name: Leon Rodriguez MRN: [id number] Date of Birth: 09/01/1965  Today's Date: 06/22/2023 PT Individual Time: 4098-1191 PT Individual Time Calculation (min): 74 min   Short Term Goals: Week 1:  PT Short Term Goal 1 (Week 1): pt will perform Supine<>sit with mod a of 1 consistently PT Short Term Goal 2 (Week 1): Pt will perform sit<>stands using LRAD with mod A of  2 PT Short Term Goal 3 (Week 1): Pt will perform bed<>chair transfers using LRAD with mod A of 2 and +2 for safety as needed  Skilled Therapeutic Interventions/Progress Updates:    Pt received asleep, supine in bed with his wife present and pt agreeable to therapy session. Supine>sitting R EOB, HOB elevated and relying heavily on bedrails, with +2 mod A to bring B LEs over to EOB and maintain trunk upright due to posterior lean bias. Donned cervical collar for OOB mobility.   Initially upon sitting EOB requires heavy max A to maintain trunk upright due to posterior lean bias (difficult to tell throughout session if this is related to tone, impaired motor planning, or fear of falling when leaning forward -- and/or a combination of some?) Progressed to maintaining static sitting with R UE support on bedrail and L HHA providing heavy min A.   L lateral scoot transfer EOB>TIS w/c using transfer board with pt able to perform R lateral trunk lean with mod/max A of 1 during dependent board placement - completed transfer with +2 max/total A - therapist performing bilateral knee block to keep his hips from sliding forward and using bed pad to decrease sheering forces while sliding due to inability to clear his hips - cuing throughout to maintain anterior trunk flexion and keep R foot on floor due to extensor tone kicking in causing his leg to shoot out in front of him. Therapist providing knee block until +2 can tilt w/c back to ensure pt safety once in seat.   Transported to/from gym in w/c for  time management and energy conservation.  Sit>stand from TIS w/c dependently using standing frame x2reps - placed pillow at pt's feet to avoid hitting foot plate; however, in future need to place more padding at pt's knees (got small shearing type skin tear on L knee, approximately size of nickel, and applied foam dressing to protect it and notified nursing). Pt tolerated standing 2-50minutes each time with gradual progression to more upright - L LE flexor tone kicked in during 1st stand requiring return to sitting; however, pt lacks hip extension ROM bilaterally and knee extension ROM on L preventing full upright posture. Pt relies heavily on B UE support to maintain trunk upright.  **Patient has extensor tone in R LE and flexor tone in L LE  Transported back to room. R lateral scoot TIS w/c>EOB using transfer board with +2 total A as described above - continued to reinforced cue to keep anterior trunk lean/weight shift, keep R foot on floor avoiding onset of extensor tone, and therapist blocking knees to keep hips from sliding forward.  Sitting EOB worked on trunk control and comfort with anterior trunk lean in preparation for transfers via B UE support on therapist's shoulders providing slow, gradual movement into forward trunk lean - pt reports some minor pulling in low back but not limiting participation. Pt with poor pelvic positioning on bed causing significant spine curvature towards L with limited ability to correct. Pt will benefit from continued unsupported seated activities to improve  trunk control and core muscle activation.  Sit>supine with +2 mod A for trunk descent via reverse logroll and B LE management up onto bed.   Placed bilateral prevalon boots for heel protection and blanket rolls between legs to avoid excessive hip adduction. L LE continues to lack full extension, due to flexor tone.  Pt left supine in bed with needs in reach, bed alarm on, and his wife present.    Therapy  Documentation Precautions:  Precautions Precautions: Fall, Cervical Precaution Comments: hx of many falls, severe B LE hypertonia with muscle spasms, verbally went through cervical precautions throughout session Required Braces or Orthoses: Cervical Brace Cervical Brace: Hard collar, Other (comment) (OOB) Restrictions Weight Bearing Restrictions Per Provider Order: No Other Position/Activity Restrictions: cervical precautions   Pain:  Pt with pain during muscle spasms in B LEs - therapist provided heating pad to L hamstring 2x 5 minutes during session to help with muscle relaxation and pain management. Provided gentle movements and avoided providing tactile stimulus to his feet or behind his L knee as that seemed to trigger a spasm.    Therapy/Group: Individual Therapy  Ginny Forth , PT, DPT, NCS, CSRS 06/22/2023, 3:20 PM

## 2023-06-22 NOTE — Progress Notes (Signed)
Noted blister to left lateral foot. Notified Delle Reining, [place]. New orders placed.    Tilden Dome, LPN

## 2023-06-22 NOTE — Progress Notes (Signed)
PROGRESS NOTE   Subjective/Complaints: Had a good night of sleep. Got meds earlier. Alert this morning. Having pain in low back/spasms. Legs about same  ROS: Patient denies fever, rash, sore throat, blurred vision, dizziness, nausea, vomiting, diarrhea, cough, shortness of breath or chest pain,   headache, or mood change.    Objective:   No results found. No results for input(s): "WBC", "HGB", "HCT", "PLT" in the last 72 hours.  No results for input(s): "NA", "K", "CL", "CO2", "GLUCOSE", "BUN", "CREATININE", "CALCIUM" in the last 72 hours.   Intake/Output Summary (Last 24 hours) at 06/22/2023 0857 Last data filed at 06/22/2023 0836 Gross per 24 hour  Intake 717 ml  Output 3053 ml  Net -2336 ml        Physical Exam: Vital Signs Blood pressure (!) 141/84, pulse 96, temperature 98 F (36.7 C), temperature source Oral, resp. rate 19, height 5\' 9"  (1.753 m), weight 89.9 kg, SpO2 100%. Constitutional: No distress . Vital signs reviewed. HEENT: NCAT, EOMI, oral membranes moist Neck: supple Cardiovascular: RRR without murmur. No JVD    Respiratory/Chest: CTA Bilaterally without wheezes or rales. Normal effort    GI/Abdomen: BS +, non-tender, non-distended Ext: no clubbing, cyanosis, or edema Psych: awake and more up beat  Musculoskeletal:     Cervical back: Neck supple. No tenderness.     Comments: Ue's 5-/5 throughout on exam B/L RLE- HF 2-/5; KE 2-/5; DF 4-/5; and PF 4-/5 LLE- HF 2-/5; KE 2-/5; DF 2/5; PF 2/5- spasticity still present Skin:    -large blood blister left heel, dressed with mepilex (forgot to document yesterday)   Neurological:     Comments: Sensory level at T4 and below- MAS of 2-3 of LE's and extensor tone severe- ongoing spasms seen with ROM with clonus      Assessment/Plan: 1. Functional deficits which require 3+ hours per day of interdisciplinary therapy in a comprehensive inpatient rehab  setting. Physiatrist is providing close team supervision and 24 hour management of active medical problems listed below. Physiatrist and rehab team continue to assess barriers to discharge/monitor patient progress toward functional and medical goals  Care Tool:  Bathing    Body parts bathed by patient: Face   Body parts bathed by helper: Right arm, Left arm, Chest, Abdomen, Front perineal area, Buttocks, Right upper leg, Left upper leg, Right lower leg, Left lower leg (wife assisted pt with bath prior to OT evaluation)     Bathing assist Assist Level: Total Assistance - Patient < 25%     Upper Body Dressing/Undressing Upper body dressing   What is the patient wearing?: Pull over shirt    Upper body assist Assist Level: Minimal Assistance - Patient > 75%    Lower Body Dressing/Undressing Lower body dressing      What is the patient wearing?: Pants, Incontinence brief     Lower body assist Assist for lower body dressing: Total Assistance - Patient < 25%     Toileting Toileting    Toileting assist Assist for toileting: 2 Helpers     Transfers Chair/bed transfer  Transfers assist     Chair/bed transfer assist level: Dependent - Patient 0%  Locomotion Ambulation   Ambulation assist   Ambulation activity did not occur: Safety/medical concerns (limited by extreme tone)          Walk 10 feet activity   Assist  Walk 10 feet activity did not occur: Safety/medical concerns        Walk 50 feet activity   Assist Walk 50 feet with 2 turns activity did not occur: Safety/medical concerns         Walk 150 feet activity   Assist Walk 150 feet activity did not occur: Safety/medical concerns         Walk 10 feet on uneven surface  activity   Assist Walk 10 feet on uneven surfaces activity did not occur: Safety/medical concerns         Wheelchair     Assist Is the patient using a wheelchair?: Yes Type of Wheelchair:  Manual Wheelchair activity did not occur: Safety/medical concerns         Wheelchair 50 feet with 2 turns activity    Assist    Wheelchair 50 feet with 2 turns activity did not occur: Safety/medical concerns       Wheelchair 150 feet activity     Assist  Wheelchair 150 feet activity did not occur: Safety/medical concerns       Blood pressure (!) 141/84, pulse 96, temperature 98 F (36.7 C), temperature source Oral, resp. rate 19, height 5\' 9"  (1.753 m), weight 89.9 kg, SpO2 100%.  Medical Problem List and Plan: 1. Functional deficits secondary to Cervical myelopathy s/p ACDF- back after sepsis             -patient may  shower             -ELOS/Goals: 3-4 weeks- min A hopefully Grounds pass ordered -Continue CIR therapies including PT, OT   -have counseled pt about why CT myelo wasn't performed. Discussed spasticity. He'll likely need a baclofen pump given severity. 2.  Antithrombotics: -DVT/anticoagulation:  Pharmaceutical: Lovenox             -antiplatelet therapy: N/A 3. Pain Management:  MS contin 15 mg bid, Oxycodone prn. Lidoderm patch for local measures. Tylenol 650 mg qid.             --bilateral hip pain with attempts at rolling.              --on Baclofen 20 mg qid, Valium 5 mg qid with Zanaflex 4 mg qid prn for spasticity.  -12/27 Started Duloxetine 20 mg at bedtime 12/25- titrate up every 3-4 days as tolerated  -bumped baclofen to 25mg  qid, consider scheduling tizanidine  -added gabapentin 100mg  tid for injury level/chest wall pain/spasticity 12/28- pt very alert this morning. Discussed need to push antispasmodics given the severity of his spasticity. Continue to titrate neuro-sedation permitting -increase baclofen to 30mg  qid -continue valium, consider scheduled tizanidine also -will increase duloxetine (now in am) to 30mg  -continue same gabapentin  4. Mood/Behavior/Sleep: LCSW to follow for evaluation and support.              -antipsychotic agents:  N/A  -12/27--trazodone and melatonin helpful 12.28 much more alert after receiving these earlier last night. 5. Neuropsych/cognition: This patient is capable of making decisions on his own behalf. 6. Skin/Wound Care: Routine pressure relief measures.   -has developed left heel sore d/t tone, struggles to fit in [facility]  -heel dressed, ordered prevalon boots so that he's better able to tolerate wear-- -12/28 didn't have on this morning. Asked  nurse to make sure they were in place 7. Hyponatremia: continue to monitor Na weekly  8. Klebsiella aerogenes bacteremia/sepsis /UTI: levaquin  -abx completed per ID recs--7 days 9. Neurogenic bowel: Continue Senna S 2 tabs BID. Miralax daily-->has been incontinent of bowel and question due to antibiotics and/or laxatives --Suppository daily after supper-->has refused for past 3 nights.  --would prefer enema three times a week as at home. Have suggested using mini enema -12/28 had large bm this morning  -refusing docusate enema some nights 10. Neurogenic bladder: Foley was replaced on acute-->will d/c and start bladder program. (patient would like to resume) --Monitor voiding with PVR checks             --use coude cath and monitor for recurrent hematuria             --continue flomax  11. 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

## 2023-06-23 DIAGNOSIS — R252 Cramp and spasm: Secondary | ICD-10-CM | POA: Diagnosis not present

## 2023-06-23 DIAGNOSIS — N319 Neuromuscular dysfunction of bladder, unspecified: Secondary | ICD-10-CM | POA: Diagnosis not present

## 2023-06-23 DIAGNOSIS — G959 Disease of spinal cord, unspecified: Secondary | ICD-10-CM | POA: Diagnosis not present

## 2023-06-23 DIAGNOSIS — M792 Neuralgia and neuritis, unspecified: Secondary | ICD-10-CM | POA: Diagnosis not present

## 2023-06-23 NOTE — Progress Notes (Signed)
Occupational Therapy Session Note  Patient Details  Name: Leon Rodriguez MRN: [id number] Date of Birth: 1966/04/18  Today's Date: 06/23/2023 OT Individual Time: 1330-1445 OT Individual Time Calculation (min): 75 min    Short Term Goals: Week 1:    see week 2  Skilled Therapeutic Interventions/Progress Updates:      Therapy Documentation Precautions:  Precautions Precautions: Fall, Cervical Precaution Comments: hx of many falls, severe B LE hypertonia with muscle spasms, verbally went through cervical precautions throughout session Required Braces or Orthoses: Cervical Brace Cervical Brace: Hard collar, Other (comment) (OOB) Restrictions Weight Bearing Restrictions Per Provider Order: No Other Position/Activity Restrictions: cervical precautions General: I am so tired. Pt supine in bed upon OT arrival, agreeable to OT session. Pt requesting to do bed level session d/t increased pain/spasms. Daughter, son in law and grandson present.   Pain:  7/10 pain reported in back, activity, intermittent rest breaks, distractions provided for pain management, pt reports tolerable to proceed. OT offered heat packs and pt declined.  OT discussed potential for pt to receive k pad with nurse.  ADL: Pt and OT discussed D/C planning with pt and family. Pt daughter discussed potentilly renting stair lift so 2nd floor accessible for pt. OT discussed potential options for W/C.  Exercises: Pt completed the following exercise circuit in order to improve functional activity, strength and endurance to prepare for ADLs such as bathing. Pt completed the following exercises in supine position with no noted LOB/SOB and  repetitions on each exercise: -bicep curls -triceps extensions -shoulder abduction -forward punches -External rotation -shoulder flexion -diagonal shoulder raises   Pt supine in bed with bed alarm activated, 2 bed rails up, call light within reach and 4Ps assessed. Family  present.   Therapy/Group: Individual Therapy  Velia Meyer, OTD, OTR/L 06/23/2023, 4:12 PM

## 2023-06-23 NOTE — Progress Notes (Signed)
PROGRESS NOTE   Subjective/Complaints: Had another good night of sleep. Spasms generally a bit better. Looks as if he bumped his left knee in therapy and has a small abrasion  ROS: Patient denies fever, rash, sore throat, blurred vision, dizziness, nausea, vomiting, diarrhea, cough, shortness of breath or chest pain,  headache, or mood change.    Objective:   No results found. No results for input(s): "WBC", "HGB", "HCT", "PLT" in the last 72 hours.  No results for input(s): "NA", "K", "CL", "CO2", "GLUCOSE", "BUN", "CREATININE", "CALCIUM" in the last 72 hours.   Intake/Output Summary (Last 24 hours) at 06/23/2023 0849 Last data filed at 06/23/2023 0530 Gross per 24 hour  Intake 360 ml  Output 1775 ml  Net -1415 ml        Physical Exam: Vital Signs Blood pressure 109/68, pulse 85, temperature 97.8 F (36.6 C), resp. rate 19, height 5\' 9"  (1.753 m), weight 95.9 kg, SpO2 100%. Constitutional: No distress . Vital signs reviewed. HEENT: NCAT, EOMI, oral membranes moist Neck: supple Cardiovascular: RRR without murmur. No JVD    Respiratory/Chest: CTA Bilaterally without wheezes or rales. Normal effort    GI/Abdomen: BS +, non-tender, non-distended Ext: no clubbing, cyanosis, or edema Psych: more up beat, cooperative  Musculoskeletal:     Cervical back: Neck supple. No tenderness.     Comments: Ue's 5-/5 throughout on exam B/L RLE- HF 2-/5; KE 2-/5; DF 4-/5; and PF 4-/5 LLE- HF 2-/5; KE 2-/5; DF 2/5; PF 2/5- spasticity still present Skin:    -large blood blister left heel--stable in appearance,  mepilex dressing in place.   Neurological:     Comments: Sensory level at T4 and below- MAS of 2-3 of LE's and extensor tone severe- tolerated ROM a bit better this am     Assessment/Plan: 1. Functional deficits which require 3+ hours per day of interdisciplinary therapy in a comprehensive inpatient rehab  setting. Physiatrist is providing close team supervision and 24 hour management of active medical problems listed below. Physiatrist and rehab team continue to assess barriers to discharge/monitor patient progress toward functional and medical goals  Care Tool:  Bathing    Body parts bathed by patient: Face   Body parts bathed by helper: Right arm, Left arm, Chest, Abdomen, Front perineal area, Buttocks, Right upper leg, Left upper leg, Right lower leg, Left lower leg (wife assisted pt with bath prior to OT evaluation)     Bathing assist Assist Level: Total Assistance - Patient < 25%     Upper Body Dressing/Undressing Upper body dressing   What is the patient wearing?: Pull over shirt    Upper body assist Assist Level: Minimal Assistance - Patient > 75%    Lower Body Dressing/Undressing Lower body dressing      What is the patient wearing?: Pants, Incontinence brief     Lower body assist Assist for lower body dressing: Total Assistance - Patient < 25%     Toileting Toileting    Toileting assist Assist for toileting: 2 Helpers     Transfers Chair/bed transfer  Transfers assist     Chair/bed transfer assist level: Dependent - Patient 0%  Locomotion Ambulation   Ambulation assist   Ambulation activity did not occur: Safety/medical concerns (limited by extreme tone)          Walk 10 feet activity   Assist  Walk 10 feet activity did not occur: Safety/medical concerns        Walk 50 feet activity   Assist Walk 50 feet with 2 turns activity did not occur: Safety/medical concerns         Walk 150 feet activity   Assist Walk 150 feet activity did not occur: Safety/medical concerns         Walk 10 feet on uneven surface  activity   Assist Walk 10 feet on uneven surfaces activity did not occur: Safety/medical concerns         Wheelchair     Assist Is the patient using a wheelchair?: Yes Type of Wheelchair:  Manual Wheelchair activity did not occur: Safety/medical concerns         Wheelchair 50 feet with 2 turns activity    Assist    Wheelchair 50 feet with 2 turns activity did not occur: Safety/medical concerns       Wheelchair 150 feet activity     Assist  Wheelchair 150 feet activity did not occur: Safety/medical concerns       Blood pressure 109/68, pulse 85, temperature 97.8 F (36.6 C), resp. rate 19, height 5\' 9"  (1.753 m), weight 95.9 kg, SpO2 100%.  Medical Problem List and Plan: 1. Functional deficits secondary to Cervical myelopathy s/p ACDF- back after sepsis             -patient may  shower             -ELOS/Goals: 3-4 weeks- min A hopefully Grounds pass ordered -Continue CIR therapies including PT, OT   -have counseled pt/wife about why CT myelo wasn't performed. Discussed spasticity. He'll likely need a baclofen pump given severity. 2.  Antithrombotics: -DVT/anticoagulation:  Pharmaceutical: Lovenox             -antiplatelet therapy: N/A 3. Pain Management/spasticity:  MS contin 15 mg bid, Oxycodone prn. Lidoderm patch for local measures. Tylenol 650 mg qid.             --bilateral hip pain with attempts at rolling.              --on Baclofen 20 mg qid, Valium 5 mg qid with Zanaflex 4 mg qid prn for spasticity.  -12/27 Started Duloxetine 20 mg at bedtime 12/25- titrate up every 3-4 days as tolerated  -bumped baclofen to 25mg  qid, consider scheduling tizanidine  -added gabapentin 100mg  tid for injury level/chest wall pain/spasticity 12/29- pushing antispasmodics given the severity of his spasticity. Continue to titrate neuro-sedation permitting -increased baclofen to 30mg  qid 12/28 -continue valium, consider scheduled tizanidine also -increased duloxetine (now in am) to 30mg  -pt refuses to take gabapentin as he doesn't want more potentially sedating medication--I stopped it yesterday 4. Mood/Behavior/Sleep: LCSW to follow for evaluation and support.               -antipsychotic agents: N/A  -12/27--trazodone and melatonin helpful 12/28 doing better with earlier scheduling of trazodone and melatonin 5. Neuropsych/cognition: This patient is capable of making decisions on his own behalf. 6. Skin/Wound Care: Routine pressure relief measures.   -has developed left heel sore d/t tone, struggles to fit in [facility]  -heel dressed, ordered prevalon boots so that he's better able to tolerate wear-- -12/29 prevalon boots in place this am--tolerating  7. Hyponatremia: continue to monitor Na weekly  8. Klebsiella aerogenes bacteremia/sepsis /UTI: levaquin  -abx completed per ID recs--7 days 9. Neurogenic bowel: Continue Senna S 2 tabs BID. Miralax daily-->has been incontinent of bowel and question due to antibiotics and/or laxatives --Suppository daily after supper-->has refused for past 3 nights.  --would prefer enema three times a week as at home. Have suggested using mini enema -12/29 had large bm in early morning 12/28  -refusing docusate enema some nights including last night 10. Neurogenic bladder: Foley was replaced on acute-->will d/c and start bladder program. (patient would like to resume) --Monitor voiding with PVR checks             --use coude cath and monitor for recurrent hematuria             --continue flomax\  12/29 cath volumes generally in range until this morning--continue current schedule  11. 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

## 2023-06-23 NOTE — Progress Notes (Signed)
Occupational Therapy Session Note  Patient Details  Name: Leon Rodriguez MRN: [id number] Date of Birth: 10/12/1965  Today's Date: 06/23/2023 OT Individual Time: 1478-2956 OT Individual Time Calculation (min): 70 min   Short Term Goals: Week 1:  See Flowsheets  Skilled Therapeutic Interventions/Progress Updates:  Pt greeted sitting EOB for skilled OT session with focus on caregiver education and dynamic standing balance.   Pain: Pt with un-rated pain in back, medications administered during session. OT offering intermediate rest breaks and positioning suggestions throughout session to address pain/fatigue and maximize participation/safety in session.   Functional Transfers: Pt rolls R/L with A for BLE management, heavy use of bed rails. Maximove used for bed>TIS WC transfer due to increased levels of tone/spasticity.   Self Care Tasks: Caregiver education attempted for BLE ROM/stretching program, pt unable to tolerate this session due to increased spasticity/tone.   Therapeutic Activities: Seated in WC, pt tasked with reaching outside BOS to retrieve items and tap colored visual targets (alternating UE) for dynamic sitting balance and core strengthening. Pt observed supporting self unilaterally on arm-rests despite cuing. CGA provided throughout activity.   Education: Pt and spouse educated on need for footwear for skin integrity, especially as patient already has breakdown on L-heel.   Pt remained sitting in TIS WC, heat-pack applied to L thigh area for tone, 4Ps assessed and immediate needs met. Pt continues to be appropriate for skilled OT intervention to promote further functional independence in ADLs/IADLs.   Therapy Documentation Precautions:  Precautions Precautions: Fall, Cervical Precaution Comments: hx of many falls, severe B LE hypertonia with muscle spasms, verbally went through cervical precautions throughout session Required Braces or Orthoses: Cervical Brace Cervical  Brace: Hard collar, Other (comment) (OOB) Restrictions Weight Bearing Restrictions Per Provider Order: No Other Position/Activity Restrictions: cervical precautions   Therapy/Group: Individual Therapy  Lou Cal, OTR/L, MSOT  06/23/2023, 6:20 AM

## 2023-06-23 NOTE — Progress Notes (Signed)
Physical Therapy Session Note  Patient Details  Name: Leon Rodriguez MRN: [id number] Date of Birth: [birth year]  Today's Date: 06/23/2023 PT Individual Time: 3244-0102 PT Individual Time Calculation (min): 71 min   Short Term Goals: Week 1:  PT Short Term Goal 1 (Week 1): pt will perform Supine<>sit with mod a of 1 consistently PT Short Term Goal 2 (Week 1): Pt will perform sit<>stands using LRAD with mod A of  2 PT Short Term Goal 3 (Week 1): Pt will perform bed<>chair transfers using LRAD with mod A of 2 and +2 for safety as needed  Skilled Therapeutic Interventions/Progress Updates:  Pt was seen bedside in the am, sitting up in tilt in space w/c. Pt received pain meds prior to treatment. Pt was drowsy initially at beginning of treatment. Began treatment with static stretching B lower extremities across all joints. Pt transported to rehab gym. Pt sat up right in tilt in space w/c with 4" step under feet with sliding board behind feet in front of all wheels to position pt's hips, knees and ankles at 90 degrees. Treatment worked on leaning forward bringing hands to knees and then worked on unilateral reaching with other hand supported on knee. Also worked on upright posture and control with hands on B knees. Pt is able to flex and ext with R knee and only able to flex with L knee today. Pt required multiple rest breaks secondary to fatigue. Also discussed moving slowly and focusing on muscle control with any mobility. Pt returned to room following treatment and left sitting up in tilt in space w/c with all needs within reach and wife at bedside.   Therapy Documentation Precautions:  Precautions Precautions: Fall, Cervical Precaution Comments: hx of many falls, severe B LE hypertonia with muscle spasms, verbally went through cervical precautions throughout session Required Braces or Orthoses: Cervical Brace Cervical Brace: Hard collar, Other (comment) (OOB) Restrictions Weight Bearing  Restrictions Per Provider Order: No Other Position/Activity Restrictions: cervical precautions General:   Pain: Pt received pain meds prior to treatment.   Therapy/Group: Individual Therapy  [name] 06/23/2023, 12:06 PM

## 2023-06-23 NOTE — Plan of Care (Signed)
°  Problem: SCI BOWEL ELIMINATION Goal: RH STG SCI MANAGE BOWEL WITH MEDICATION WITH ASSISTANCE Description: STG SCI Manage bowel with medication with mod I assistance. Outcome: Completed/Met   Problem: SCI BLADDER ELIMINATION Goal: RH STG MANAGE BLADDER WITH ASSISTANCE Description: STG Manage Bladder With toileting Assistance Outcome: Progressing   Problem: RH SKIN INTEGRITY Goal: RH STG SKIN FREE OF INFECTION/BREAKDOWN Description: Manage w min assist Outcome: Progressing   Problem: RH SAFETY Goal: RH STG ADHERE TO SAFETY PRECAUTIONS W/ASSISTANCE/DEVICE Description: STG Adhere to Safety Precautions With cues  Assistance/Device. Outcome: Progressing

## 2023-06-23 NOTE — Plan of Care (Signed)
°  Problem: Consults Goal: RH SPINAL CORD INJURY PATIENT EDUCATION Description:  See Patient Education module for education specifics.  Outcome: Progressing   Problem: SCI BOWEL ELIMINATION Goal: RH STG MANAGE BOWEL WITH ASSISTANCE Description: STG Manage Bowel with toileting Assistance. Outcome: Progressing   Problem: SCI BLADDER ELIMINATION Goal: RH STG MANAGE BLADDER WITH ASSISTANCE Description: STG Manage Bladder With toileting Assistance Outcome: Progressing   Problem: RH SKIN INTEGRITY Goal: RH STG SKIN FREE OF INFECTION/BREAKDOWN Description: Manage w min assist Outcome: Progressing   Problem: RH SAFETY Goal: RH STG ADHERE TO SAFETY PRECAUTIONS W/ASSISTANCE/DEVICE Description: STG Adhere to Safety Precautions With cues  Assistance/Device. Outcome: Progressing   Problem: RH PAIN MANAGEMENT Goal: RH STG PAIN MANAGED AT OR BELOW PT'S PAIN GOAL Description: < 4 with prns Outcome: Progressing   Problem: RH KNOWLEDGE DEFICIT SCI Goal: RH STG INCREASE KNOWLEDGE OF SELF CARE AFTER SCI Description: Patient and S.O. will able to manage care using educational resources for medications and dietary modifications independently Outcome: Progressing

## 2023-06-23 NOTE — Progress Notes (Signed)
MD aware of small abrasion to left knee during therapy.    Faithlynn Deeley,LPN

## 2023-06-24 ENCOUNTER — Telehealth: Payer: Self-pay

## 2023-06-24 DIAGNOSIS — G959 Disease of spinal cord, unspecified: Secondary | ICD-10-CM | POA: Diagnosis not present

## 2023-06-24 LAB — BASIC METABOLIC PANEL
Anion gap: 11 (ref 5–15)
BUN: 11 mg/dL (ref 6–20)
CO2: 27 mmol/L (ref 22–32)
Calcium: 9.7 mg/dL (ref 8.9–10.3)
Chloride: 101 mmol/L (ref 98–111)
Creatinine, Ser: 0.65 mg/dL (ref 0.61–1.24)
GFR, Estimated: >60 mL/min (ref >60)
Glucose, Bld: 91 mg/dL (ref 70–99)
Potassium: 4.1 mmol/L (ref 3.5–5.1)
Sodium: 139 mmol/L (ref 135–145)

## 2023-06-24 LAB — CBC
HCT: 31.5 % — ABNORMAL LOW (ref 39.0–52.0)
Hemoglobin: 10.9 g/dL — ABNORMAL LOW (ref 13.0–17.0)
MCH: 33.4 pg (ref 26.0–34.0)
MCHC: 34.6 g/dL (ref 30.0–36.0)
MCV: 96.6 fL (ref 80.0–100.0)
Platelets: 461 10*3/uL — ABNORMAL HIGH (ref 150–400)
RBC: 3.26 MIL/uL — ABNORMAL LOW (ref 4.22–5.81)
RDW: 12.7 % (ref 11.5–15.5)
WBC: 6.2 10*3/uL (ref 4.0–10.5)
nRBC: 0 % (ref 0.0–0.2)

## 2023-06-24 MED ORDER — TIZANIDINE HCL 2 MG PO TABS
1.0000 mg | ORAL_TABLET | Freq: Four times a day (QID) | ORAL | Status: DC | PRN
  Administered 2023-06-24 – 2023-06-26 (×4): 1 mg via ORAL
  Filled 2023-06-24 (×6): qty 0.5

## 2023-06-24 MED ORDER — DANTROLENE SODIUM 25 MG PO CAPS
50.0000 mg | ORAL_CAPSULE | Freq: Every day | ORAL | Status: DC
  Administered 2023-06-24: 50 mg via ORAL
  Filled 2023-06-24: qty 2

## 2023-06-24 NOTE — Plan of Care (Signed)
°  Problem: Consults Goal: RH SPINAL CORD INJURY PATIENT EDUCATION Description:  See Patient Education module for education specifics.  Outcome: Progressing   Problem: SCI BOWEL ELIMINATION Goal: RH STG MANAGE BOWEL WITH ASSISTANCE Description: STG Manage Bowel with toileting Assistance. Outcome: Progressing   Problem: SCI BLADDER ELIMINATION Goal: RH STG MANAGE BLADDER WITH ASSISTANCE Description: STG Manage Bladder With toileting Assistance Outcome: Progressing   Problem: RH SKIN INTEGRITY Goal: RH STG SKIN FREE OF INFECTION/BREAKDOWN Description: Manage w min assist Outcome: Progressing   Problem: RH SAFETY Goal: RH STG ADHERE TO SAFETY PRECAUTIONS W/ASSISTANCE/DEVICE Description: STG Adhere to Safety Precautions With cues  Assistance/Device. Outcome: Progressing   Problem: RH PAIN MANAGEMENT Goal: RH STG PAIN MANAGED AT OR BELOW PT'S PAIN GOAL Description: < 4 with prns Outcome: Progressing   Problem: RH KNOWLEDGE DEFICIT SCI Goal: RH STG INCREASE KNOWLEDGE OF SELF CARE AFTER SCI Description: Patient and S.O. will able to manage care using educational resources for medications and dietary modifications independently Outcome: Progressing

## 2023-06-24 NOTE — Progress Notes (Signed)
PROGRESS NOTE   Subjective/Complaints:  Pt reports spasticity still a big issue for him-  Still not controlled- is concerned about getting sleepy if increase traditional meds- will try adding Dantrolene- since close to max of baclofen and other meds making sleepy.   Focused. Perseverative on finding a controlled for his tv he brought in.   Says doesn't want cymbalta for pain but willing to take it for mood.   Also told nurse felt very constipated- has been doing enema every OTHER day, but now feels full of stool.  Plan for sorbitol today.     ROS:  Pt denies SOB, abd pain, CP, N/V/C/D, and vision changes  Negative except for HPI  Objective:   No results found. Recent Labs    06/24/23 0528  WBC 6.2  HGB 10.9*  HCT 31.5*  PLT 461*    Recent Labs    06/24/23 0528  NA 139  K 4.1  CL 101  CO2 27  GLUCOSE 91  BUN 11  CREATININE 0.65  CALCIUM 9.7     Intake/Output Summary (Last 24 hours) at 06/24/2023 1013 Last data filed at 06/24/2023 4696 Gross per 24 hour  Intake 617 ml  Output 2000 ml  Net -1383 ml        Physical Exam: Vital Signs Blood pressure 112/79, pulse 84, temperature 98.2 F (36.8 C), resp. rate 19, height 5\' 9"  (1.753 m), weight 94.1 kg, SpO2 100%.    General: awake, alert, sitting up in bed; asking for tv controller; NAD HENT: conjugate gaze; oropharynx moist CV: regular rate and rhythm; no JVD Pulmonary: CTA B/L; no W/R/R- good air movement GI: soft, NT, ND, (+)BS Psychiatric: very flat affect- depressed Neurological: Ox3 Full body spasms spontaneously- didn't touch him to cause, but worse with any ROM Ext: no clubbing, cyanosis, or edema Psych: more up beat, cooperative  Musculoskeletal:     Cervical back: Neck supple. No tenderness.     Comments: Ue's 5-/5 throughout on exam B/L RLE- HF 2-/5; KE 2-/5; DF 4-/5; and PF 4-/5 LLE- HF 2-/5; KE 2-/5; DF 2/5; PF 2/5- spasticity  still present Skin:    -large blood blister left heel--stable in appearance,  mepilex dressing in place.   Neurological:     Comments: Sensory level at T4 and below- MAS of 2-3 of LE's and extensor tone severe- tolerated ROM a bit better this am     Assessment/Plan: 1. Functional deficits which require 3+ hours per day of interdisciplinary therapy in a comprehensive inpatient rehab setting. Physiatrist is providing close team supervision and 24 hour management of active medical problems listed below. Physiatrist and rehab team continue to assess barriers to discharge/monitor patient progress toward functional and medical goals  Care Tool:  Bathing    Body parts bathed by patient: Face   Body parts bathed by helper: Right arm, Left arm, Chest, Abdomen, Front perineal area, Buttocks, Right upper leg, Left upper leg, Right lower leg, Left lower leg (wife assisted pt with bath prior to OT evaluation)     Bathing assist Assist Level: Total Assistance - Patient < 25%     Upper Body Dressing/Undressing Upper body dressing  What is the patient wearing?: Pull over shirt    Upper body assist Assist Level: Minimal Assistance - Patient > 75%    Lower Body Dressing/Undressing Lower body dressing      What is the patient wearing?: Pants, Incontinence brief     Lower body assist Assist for lower body dressing: Total Assistance - Patient < 25%     Toileting Toileting    Toileting assist Assist for toileting: 2 Helpers     Transfers Chair/bed transfer  Transfers assist     Chair/bed transfer assist level: Dependent - Patient 0%     Locomotion Ambulation   Ambulation assist   Ambulation activity did not occur: Safety/medical concerns (limited by extreme tone)          Walk 10 feet activity   Assist  Walk 10 feet activity did not occur: Safety/medical concerns        Walk 50 feet activity   Assist Walk 50 feet with 2 turns activity did not occur:  Safety/medical concerns         Walk 150 feet activity   Assist Walk 150 feet activity did not occur: Safety/medical concerns         Walk 10 feet on uneven surface  activity   Assist Walk 10 feet on uneven surfaces activity did not occur: Safety/medical concerns         Wheelchair     Assist Is the patient using a wheelchair?: Yes Type of Wheelchair: Manual Wheelchair activity did not occur: Safety/medical concerns  Wheelchair assist level: Dependent - Patient 0% Max wheelchair distance: 150    Wheelchair 50 feet with 2 turns activity    Assist    Wheelchair 50 feet with 2 turns activity did not occur: Safety/medical concerns   Assist Level: Dependent - Patient 0%   Wheelchair 150 feet activity     Assist  Wheelchair 150 feet activity did not occur: Safety/medical concerns   Assist Level: Dependent - Patient 0%   Blood pressure 112/79, pulse 84, temperature 98.2 F (36.8 C), resp. rate 19, height 5\' 9"  (1.753 m), weight 94.1 kg, SpO2 100%.  Medical Problem List and Plan: 1. Functional deficits secondary to Cervical myelopathy s/p ACDF- back after sepsis             -patient may  shower             -ELOS/Goals: 3-4 weeks- min A hopefully Grounds pass ordered -Con't CIR PT and OT -have counseled pt/wife about why CT myelo wasn't performed. Discussed spasticity. He'll likely need a baclofen pump given severity. My concern is how to get a pump in him if no CSF flowing down below? 2.  Antithrombotics: -DVT/anticoagulation:  Pharmaceutical: Lovenox             -antiplatelet therapy: N/A 3. Pain Management/spasticity:  MS contin 15 mg bid, Oxycodone prn. Lidoderm patch for local measures. Tylenol 650 mg qid.             --bilateral hip pain with attempts at rolling.              --on Baclofen 20 mg qid, Valium 5 mg qid with Zanaflex 4 mg qid prn for spasticity.  -12/27 Started Duloxetine 20 mg at bedtime 12/25- titrate up every 3-4 days as  tolerated  -bumped baclofen to 25mg  qid, consider scheduling tizanidine  -added gabapentin 100mg  tid for injury level/chest wall pain/spasticity 12/29- pushing antispasmodics given the severity of his spasticity. Continue to titrate  neuro-sedation permitting -increased baclofen to 30mg  qid 12/28 -continue valium, consider scheduled tizanidine also -increased duloxetine (now in am) to 30mg  -pt refuses to take gabapentin as he doesn't want more potentially sedating medication--I stopped it yesterday 12/30- will add Dantrolene 50 mg at bedtime- explained it's gradual onset- will increase q5 days 4. Mood/Behavior/Sleep: LCSW to follow for evaluation and support.              -antipsychotic agents: N/A  -12/27--trazodone and melatonin helpful 12/28 doing better with earlier scheduling of trazodone and melatonin 5. Neuropsych/cognition: This patient is capable of making decisions on his own behalf. 6. Skin/Wound Care: Routine pressure relief measures.   -has developed left heel sore d/t tone, struggles to fit in [facility]  -heel dressed, ordered prevalon boots so that he's better able to tolerate wear-- -12/29 prevalon boots in place this am--tolerating  7. Hyponatremia: continue to monitor Na weekly  12/30- Na 139 8. Klebsiella aerogenes bacteremia/sepsis /UTI: levaquin  -abx completed per ID recs--7 days 9. Neurogenic bowel: Continue Senna S 2 tabs BID. Miralax daily-->has been incontinent of bowel and question due to antibiotics and/or laxatives --Suppository daily after supper-->has refused for past 3 nights.  --would prefer enema three times a week as at home. Have suggested using mini enema -12/29 had large bm in early morning 12/28  -refusing docusate enema some nights including last night 12/30- now full of stool- since refusing enema- and will give Sorbitol and enema today, but pt needs to understand cannot refuse regularly.  10. Neurogenic bladder: Foley was replaced on acute-->will d/c  and start bladder program. (patient would like to resume) --Monitor voiding with PVR checks             --use coude cath and monitor for recurrent hematuria             --continue flomax\  12/29 cath volumes generally in range until this morning--continue current schedule  11. Neuropathy/at level SCI pain: Was on Cymbalta PTA-resumed at 20 mg at bedtime, continue--increased to 30mg  daily effective 12/28  12/30- doesn't want Cymbalta for pain, but willing to take for mood   I spent a total of 51   minutes on total care today- >50% coordination of care- due to  D/w nursing several times as well as PA- also prolonged d/w pt and nursing about dantrolene and how it works- and bowels.        LOS: 6 days A FACE TO FACE EVALUATION WAS PERFORMED  Leon Rodriguez 06/24/2023, 10:13 AM

## 2023-06-24 NOTE — Progress Notes (Signed)
IP Rehab Bowel Program Documentation   Bowel Program Start time [id number]  Dig Stim Indicated? No -patient refuses     mini enema Yes     Output? Moderate   Bowel Program Complete? Yes , handoff given yes   Patient Tolerated? Yes

## 2023-06-24 NOTE — Telephone Encounter (Signed)
Prior Authorization for patient (Lidocaine 5% patches) came through on cover my meds was submitted with last office notes awaiting approval or denial.  ONG:[id number]

## 2023-06-24 NOTE — Discharge Instructions (Signed)
Inpatient Rehab Discharge Instructions  SHAHIEM MCMURRY Discharge date and time:    Activities/Precautions/ Functional Status: Activity: no lifting, driving, or strenuous exercise for till cleared by MD Diet: regular diet Wound Care: keep wound clean and dry   Functional status:  ___ No restrictions     ___ Walk up steps independently ___ 24/7 supervision/assistance   ___ Walk up steps with assistance ___ Intermittent supervision/assistance  ___ Bathe/dress independently ___ Walk with walker     ___ Bathe/dress with assistance ___ Walk Independently    ___ Shower independently ___ Walk with assistance    ___ Shower with assistance ___ No alcohol     ___ Return to work/school ________  Special Instructions:    My questions have been answered and I understand these instructions. I will adhere to these goals and the provided educational materials after my discharge from the hospital.  Patient/Caregiver Signature _______________________________ Date __________  Clinician Signature _______________________________________ Date __________  Please bring this form and your medication list with you to all your follow-up doctor's appointments.

## 2023-06-24 NOTE — Progress Notes (Signed)
Due to not receiving enema on 12/28 pt was agreed to receive enemeez enema on 06/23/23 at 2122.

## 2023-06-24 NOTE — Progress Notes (Signed)
Physical Therapy Session Note  Patient Details  Name: Leon Rodriguez MRN: [id number] Date of Birth: 10-30-1965  Today's Date: 06/24/2023 PT Individual Time: 0902-1000 PT Individual Time Calculation (min): 58 min   Today's Date: 06/24/2023 PT Individual Time: 1303-1330 PT Individual Time Calculation (min): 27 min   Short Term Goals: Week 1:  PT Short Term Goal 1 (Week 1): pt will perform Supine<>sit with mod a of 1 consistently PT Short Term Goal 2 (Week 1): Pt will perform sit<>stands using LRAD with mod A of  2 PT Short Term Goal 3 (Week 1): Pt will perform bed<>chair transfers using LRAD with mod A of 2 and +2 for safety as needed  Skilled Therapeutic Interventions/Progress Updates:     Pt received semi reclined in bed and agrees to therapy. Reports pain in BLEs as well as abdomen, with feeling of spasming and "tightness" in abdomen. PT provides repositioning and gentle mobility to manage pain. Pt requesting to attempt BM. Pt performs supine to sit with maxA and cues for hand placement, logrolling, and sequencing, with PT providing Lt HHA and managing BLEs. Pt has bilateral lower extremity flexor tone and spasms, requiring slow movement. Pt requires extended time seated at EOB to acclimate to upright positioning, requiring minA for trunk support and cues for positioning for stability. Pt performs lateral scooting with maxA +2 toward foot of bed, with cues for anterior trunk lean and sequencing. Pt able to prop on Rt elbow to position slideboard. Pt completes slideboard transfer from bed to [facility] with maxA +2 and cues for hand placement, initiation, sequencing, and positioning. Pt able to balance on BSC with BUE support and cues for safety. Following toileting, PT utilizes maxi move to transfer pt back to bed and to facilitate pericare. Once in bed, pt performs bilateral rolling with minA, and PT completes pericare. Pt let supine with alarm intact and all needs within reach.   2nd Session: Pt  received semi reclined in bed and agrees to therapy. Reports pain in legs and abdomen. RN and PA present and provide pt with meds at initiation of session. Pt's partner present and assists pt with doffing and donning upper body clothing. Pt then performs bilateral rolling with minA to assist with donning shorts prior to additional mobility. Supine to sit with maxA +1 and cues for initiation, sequencing, and positioning. Pt performs slideboard transfer from elevated bed to [facility] with maxA +2 and cues for sequencing and body mechanics. Pt left seated in WC with alarm intact and all needs within reach.   Therapy Documentation Precautions:  Precautions Precautions: Fall, Cervical Precaution Comments: hx of many falls, severe B LE hypertonia with muscle spasms, verbally went through cervical precautions throughout session Required Braces or Orthoses: Cervical Brace Cervical Brace: Hard collar, Other (comment) (OOB) Restrictions Weight Bearing Restrictions Per Provider Order: No Other Position/Activity Restrictions: cervical precautions   Therapy/Group: Individual Therapy  Beau Fanny, PT, DT 06/24/2023, 4:34 PM

## 2023-06-24 NOTE — Progress Notes (Signed)
Occupational Therapy Session Note  Patient Details  Name: Leon Rodriguez MRN: [id number] Date of Birth: [birth year]  Session 1  Today's Date: 06/24/2023 OT Individual Time: 1120-1200 OT Individual Time Calculation (min): 40 min   Session 2  Today's Date: 06/24/2023 OT Individual Time: 1420-1530 OT Individual Time Calculation (min): 70 min    Short Term Goals: Week 1:  OT Short Term Goal 1 (Week 2): Pt will be able to tolerate sitting EOB with min A and no back support to wash UB with min A. OT Short Term Goal 2 (Week 2): Pt will be able to don a shirt with min A sitting EOB with no back support. OT Short Term Goal 3 (Week 2): Pt will be able wash LE from bed level with mod A. OT Short Term Goal 4 (Week 2): pt will be able to rise to stand in a standard stedy lift with max A of 2.  Skilled Therapeutic Interventions/Progress Updates:    Session 1 Pt received supine with 6/10 pain in his abdomen and BLE from spasms. He reports he is premedicated. A moist heat pack was applied to his abdomen for pain relief and to stimulate bowels. Pt verbose throughout session and required cueing to redirection to task. He completed bed mobility rolling R and L, initially with use of bed rails and then with OT facilitating core engagement and without UE support, requiring mod-max A. Blocked practice to maximize repetitions. Gentle PROM provided to BLE to reduce spasms and maintain muscle length. Patient required increased time for initiation, cuing, rest breaks, and for completion of tasks throughout session. Utilized therapeutic use of self throughout to promote efficiency. Pt left supine with all needs met, bed alarm set.   Session 2 Pt received sitting up in the TIS w/c with pain 2/10 at rest, increases to 8/10 when BLE spasms kick in. His girlfriend pointed out a new blister on the anterior ankle. A foam dressing was applied to this area. Pt was taken via w/c to the therapy gym for time management. He  completed 3 trials standing in the standing frame for tone management through extended weightbearing, bone density, and core stabilization. He lasted about 3 minutes the first trial, then about 1 min each subsequent trials. Extended rest breaks required. He required mod facilitation for LLE weightbearing and extension through knee. He was then taken outside in the TIS for improvement in mood and psychosocial adjustment to situation. His girlfriend was present throughout the session and supportive. He returned inside and requested to return to bed. He completed a slideboard transfer with total A. Max +2 to return to supine. He was left supine with all needs met, bed alarm set.    Therapy Documentation Precautions:  Precautions Precautions: Fall, Cervical Precaution Comments: hx of many falls, severe B LE hypertonia with muscle spasms, verbally went through cervical precautions throughout session Required Braces or Orthoses: Cervical Brace Cervical Brace: Hard collar, Other (comment) (OOB) Restrictions Weight Bearing Restrictions Per Provider Order: No Other Position/Activity Restrictions: cervical precautions  Therapy/Group: Individual Therapy  Crissie Reese 06/24/2023, 6:28 AM

## 2023-06-24 NOTE — Telephone Encounter (Signed)
Decision:Approved  Delray Alt (Key: [id number]) PA Case ID #: [id number] Need Help? Call us at [phone] Outcome Approved today by [facility] PA Case: [id number], Status: Approved, Coverage Starts on: 06/24/2023 12:00:00 AM, Coverage Ends on: 06/23/2024 12:00:00 AM. Authorization Expiration Date: 06/22/2024 Drug Lidocaine 5% patches ePA cloud logo Form [facility] [place] Form [id number] NCPDP)

## 2023-06-24 NOTE — Progress Notes (Signed)
This RN demonstrated clean technique for home cathing to patient significant other. She will attempt cath at next interval. Patient eager to learn, RN will attempt teaching to patient when appropriate. Patient was exhausted post therapy and declined.

## 2023-06-25 DIAGNOSIS — G959 Disease of spinal cord, unspecified: Secondary | ICD-10-CM | POA: Diagnosis not present

## 2023-06-25 MED ORDER — DOCUSATE SODIUM 283 MG RE ENEM
1.0000 | ENEMA | Freq: Every day | RECTAL | Status: DC
  Administered 2023-06-26 – 2023-07-11 (×14): 283 mg via RECTAL
  Filled 2023-06-25 (×17): qty 1

## 2023-06-25 MED ORDER — DOCUSATE SODIUM 283 MG RE ENEM
1.0000 | ENEMA | Freq: Once | RECTAL | Status: AC
  Administered 2023-06-25: 283 mg via RECTAL
  Filled 2023-06-25: qty 1

## 2023-06-25 MED ORDER — SORBITOL 70 % SOLN
30.0000 mL | Freq: Once | Status: AC
  Administered 2023-06-25: 30 mL via ORAL
  Filled 2023-06-25: qty 30

## 2023-06-25 MED ORDER — DOCUSATE SODIUM 283 MG RE ENEM: 1.0000 | ENEMA | Freq: Every day | RECTAL | Status: DC

## 2023-06-25 NOTE — Progress Notes (Signed)
 PROGRESS NOTE   Subjective/Complaints:  Pt reports extra large BM last night- had severe abd tightness and worsening spasticity last night right before BM- but finally had xtra large BM and felt better  Willing to do bowel program again tonight- with Sorbitol , because still feels somewhat full, but much less than yesterday.   More sleepy/tired this AM.  Kept closing eyes.  But finally woke up more- arranging plate/tray.  Said woke up at 4am for some meds (he got Baclofen ) - Baclofen  increased to 30 mg q6 hours on Saturday, if note.     ROS:    Pt denies SOB, abd pain, CP, N/V/ (+)C/D, and vision changes  Negative except for HPI  Objective:   No results found. Recent Labs    06/24/23 0528  WBC 6.2  HGB 10.9*  HCT 31.5*  PLT 461*    Recent Labs    06/24/23 0528  NA 139  K 4.1  CL 101  CO2 27  GLUCOSE 91  BUN 11  CREATININE 0.65  CALCIUM 9.7     Intake/Output Summary (Last 24 hours) at 06/25/2023 0849 Last data filed at 06/25/2023 0813 Gross per 24 hour  Intake --  Output 2376 ml  Net -2376 ml        Physical Exam: Vital Signs Blood pressure (!) 91/57, pulse 81, temperature 98 F (36.7 C), resp. rate 18, height 5' 9 (1.753 m), weight 94.2 kg, SpO2 100%.     General: awake, alert, a little off; initially asleep; kept eyes closed but finally woke up; NAD HENT: conjugate gaze; oropharynx moist CV: regular rate and rhythm; no JVD Pulmonary: CTA B/L; no W/R/R- good air movement GI: softer, slightly distended; more normoactive Psychiatric: appropriate- very flat affect Neurological: Ox3 More sleepy initially and even after woke up, was redoing task on tray; couldn't find utensils either Full body spasms spontaneously- didn't touch him to cause, but worse with any ROM Ext: no clubbing, cyanosis, or edema Psych: more up beat, cooperative  Musculoskeletal:     Cervical back: Neck supple. No  tenderness.     Comments: Ue's 5-/5 throughout on exam B/L RLE- HF 2-/5; KE 2-/5; DF 4-/5; and PF 4-/5 LLE- HF 2-/5; KE 2-/5; DF 2/5; PF 2/5- spasticity still present Skin:    -large blood blister left heel--stable in appearance,  mepilex dressing in place.   Neurological:     Comments: Sensory level at T4 and below- MAS of 2-3 of LE's and extensor tone severe- tolerated ROM a bit better this am     Assessment/Plan: 1. Functional deficits which require 3+ hours per day of interdisciplinary therapy in a comprehensive inpatient rehab setting. Physiatrist is providing close team supervision and 24 hour management of active medical problems listed below. Physiatrist and rehab team continue to assess barriers to discharge/monitor patient progress toward functional and medical goals  Care Tool:  Bathing    Body parts bathed by patient: Face   Body parts bathed by helper: Right arm, Left arm, Chest, Abdomen, Front perineal area, Buttocks, Right upper leg, Left upper leg, Right lower leg, Left lower leg (wife assisted pt with bath prior to OT evaluation)  Bathing assist Assist Level: Total Assistance - Patient < 25%     Upper Body Dressing/Undressing Upper body dressing   What is the patient wearing?: Pull over shirt    Upper body assist Assist Level: Minimal Assistance - Patient > 75%    Lower Body Dressing/Undressing Lower body dressing      What is the patient wearing?: Pants, Incontinence brief     Lower body assist Assist for lower body dressing: Total Assistance - Patient < 25%     Toileting Toileting    Toileting assist Assist for toileting: 2 Helpers     Transfers Chair/bed transfer  Transfers assist     Chair/bed transfer assist level: Dependent - Patient 0%     Locomotion Ambulation   Ambulation assist   Ambulation activity did not occur: Safety/medical concerns (limited by extreme tone)          Walk 10 feet activity   Assist  Walk 10  feet activity did not occur: Safety/medical concerns        Walk 50 feet activity   Assist Walk 50 feet with 2 turns activity did not occur: Safety/medical concerns         Walk 150 feet activity   Assist Walk 150 feet activity did not occur: Safety/medical concerns         Walk 10 feet on uneven surface  activity   Assist Walk 10 feet on uneven surfaces activity did not occur: Safety/medical concerns         Wheelchair     Assist Is the patient using a wheelchair?: Yes Type of Wheelchair: Manual Wheelchair activity did not occur: Safety/medical concerns  Wheelchair assist level: Dependent - Patient 0% Max wheelchair distance: 150    Wheelchair 50 feet with 2 turns activity    Assist    Wheelchair 50 feet with 2 turns activity did not occur: Safety/medical concerns   Assist Level: Dependent - Patient 0%   Wheelchair 150 feet activity     Assist  Wheelchair 150 feet activity did not occur: Safety/medical concerns   Assist Level: Dependent - Patient 0%   Blood pressure (!) 91/57, pulse 81, temperature 98 F (36.7 C), resp. rate 18, height 5' 9 (1.753 m), weight 94.2 kg, SpO2 100%.  Medical Problem List and Plan: 1. Functional deficits secondary to Cervical myelopathy s/p ACDF- back after sepsis             -patient may  shower             -ELOS/Goals: 3-4 weeks- min A hopefully Grounds pass ordered Cont' CIR PT and OT -have counseled pt/wife about why CT myelo wasn't performed. Discussed spasticity. He'll likely need a baclofen  pump given severity. My concern is how to get a pump in him if no CSF flowing down below? Team conference today to f/u on progress/determine new d/c day 2.  Antithrombotics: -DVT/anticoagulation:  Pharmaceutical: Lovenox              -antiplatelet therapy: N/A 3. Pain Management/spasticity:  MS contin  15 mg bid, Oxycodone  prn. Lidoderm  patch for local measures. Tylenol  650 mg qid.             --bilateral hip pain  with attempts at rolling.              --on Baclofen  20 mg qid, Valium  5 mg qid with Zanaflex  4 mg qid prn for spasticity.  -12/27 Started Duloxetine  20 mg at bedtime 12/25- titrate up every 3-4 days  as tolerated  -bumped baclofen  to 25mg  qid, consider scheduling tizanidine   -added gabapentin  100mg  tid for injury level/chest wall pain/spasticity 12/29- pushing antispasmodics given the severity of his spasticity. Continue to titrate neuro-sedation permitting -increased baclofen  to 30mg  qid 12/28 -continue valium , consider scheduled tizanidine  also -increased duloxetine  (now in am) to 30mg  -pt refuses to take gabapentin  as he doesn't want more potentially sedating medication--I stopped it yesterday 12/30- will add Dantrolene  50 mg at bedtime- explained it's gradual onset- will increase q5 days 12/31- will stop Dantrolene - so sleepy this AM- calling Dr Carollee to see what he thinks about placing ITB pump sooner than later? 4. Mood/Behavior/Sleep: LCSW to follow for evaluation and support.              -antipsychotic agents: N/A  -12/27--trazodone  and melatonin helpful 12/28 doing better with earlier scheduling of trazodone  and melatonin 5. Neuropsych/cognition: This patient is capable of making decisions on his own behalf. 6. Skin/Wound Care: Routine pressure relief measures.   -has developed left heel sore d/t tone, struggles to fit in [facility]  -heel dressed, ordered prevalon boots so that he's better able to tolerate wear-- -12/29 prevalon boots in place this am--tolerating  7. Hyponatremia: continue to monitor Na weekly  12/30- Na 139 8. Klebsiella aerogenes bacteremia/sepsis /UTI: levaquin   -abx completed per ID recs--7 days 9. Neurogenic bowel: Continue Senna S 2 tabs BID. Miralax  daily-->has been incontinent of bowel and question due to antibiotics and/or laxatives --Suppository daily after supper-->has refused for past 3 nights.  --would prefer enema three times a week as at home. Have  suggested using mini enema -12/29 had large bm in early morning 12/28  -refusing docusate enema some nights including last night 12/30- now full of stool- since refusing enema- and will give Sorbitol  and enema today, but pt needs to understand cannot refuse regularly 12/31- had extra large BM last night- willing to do enema and sorbitol  again today to empty out. .  10. Neurogenic bladder: Foley was replaced on acute-->will d/c and start bladder program. (patient would like to resume) --Monitor voiding with PVR checks             --use coude cath and monitor for recurrent hematuria             --continue flomax \  12/29 cath volumes generally in range until this morning--continue current schedule  12/31- Cath volumes looking goos- cathing q4 hours 11. 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

## 2023-06-25 NOTE — Progress Notes (Signed)
 Inpatient Rehabilitation Care Coordinator Assessment and Plan Patient Details  Name: Leon Rodriguez MRN: [id number] Date of Birth: [birth year]  Today's Date: 06/25/2023  Hospital Problems: Principal Problem:   Cervical myelopathy [facility])  Past Medical History:  Past Medical History:  Diagnosis Date   Arthritis    Hip pain    Sepsis (HCC) 06/13/2023   Past Surgical History:  Past Surgical History:  Procedure Laterality Date   ANTERIOR CERVICAL DECOMP/DISCECTOMY FUSION N/A 06/05/2023   Procedure: ANTERIOR CERVICAL DISCECTOMY AND FUSION CERVICAL SIX-SEVEN/CERVICAL SEVEN-THORACIC ONE;  Surgeon: Carollee Lani BROCKS, DO;  Location: [facility] OR;  Service: Neurosurgery;  Laterality: N/A;  3C   APPENDECTOMY     CIRCUMCISION     HIATAL HERNIA REPAIR     HIP SURGERY     at 70 and 57 yo   Social History:  reports that he has been smoking cigarettes and cigars. He has never used smokeless tobacco. He reports that he does not currently use alcohol. He reports current drug use. Drug: Marijuana.  Family / Support Systems Marital Status: Married How Long?: 3 years Patient Roles: Spouse Spouse/Significant Other: [name] (wIfe) Children: No children together; Pt thas 1 dtr- Chanelle and lives in [place], [place] Other Supports: none Anticipated Caregiver: [name] Ability/Limitations of Caregiver: Wife was initially going to return to work middle Jan 2025. SW will confirm. Unsure on amount of support at home since neither of them are working at this time. Caregiver Availability: 24/7 Family Dynamics: Pt lives with his wife.  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 Pt is not a cytogeneticist. Education: 2 years of college Health Literacy - How often do you need to have someone help you when you read instructions, pamphlets, or other written material from your doctor or pharmacy?: Never Writes: Yes Employment Status:  Employed Name of Employer: Advertising account planner Length of Employment:  (7 years) Return to Work Plans: TBD Marine Scientist Issues: Denies Guardian/Conservator: No HCPOA   Abuse/Neglect Abuse/Neglect Assessment Can Be Completed: Yes Physical Abuse: Denies Verbal Abuse: Denies Sexual Abuse: Denies Exploitation of patient/patient's resources: Denies Self-Neglect: Denies  Patient response to: Social Isolation - How often do you feel lonely or isolated from those around you?: Patient declines to respond  Emotional Status Pt's affect, behavior and adjustment status: Pt is in pain today. Recent Psychosocial Issues: See above Psychiatric History: Denies Substance Abuse History: Pt smokes cigars atleast two per day; a couple of beers daily; marijuana use once a day.  Patient / Family Perceptions, Expectations & Goals Pt/Family understanding of illness & functional limitations: pt and family have general understanding of care needs Premorbid pt/family roles/activities: Independent Anticipated changes in roles/activities/participation: Assistance with ADLs/IADLs Pt/family expectations/goals: Pt goal is to work on walking while here.  Community Resources [employer]: None Premorbid Home Care/DME Agencies: None Transportation available at discharge: wife [name] Is the patient able to respond to transportation needs?: Yes In the past 12 months, has lack of transportation kept you from medical appointments or from getting medications?: No In the past 12 months, has lack of transportation kept you from meetings, work, or from getting things needed for daily living?: No Resource referrals recommended: Neuropsychology  Discharge Planning Living Arrangements: Spouse/significant other Support Systems: Spouse/significant other Type of Residence: Private residence Civil Engineer, Contracting: Media Planner (specify) ([place] Healthy [employer]) Surveyor, Quantity Resources:  Employment Financial Screen Referred: Yes Living Expenses: Mortgage Money Management: Patient, Spouse Does the patient have any problems  obtaining your medications?: No Home Management: Pt and wife managed all home care needs Patient/Family Preliminary Plans: TBD Care Coordinator Barriers to Discharge: Decreased caregiver support, Lack of/limited family support, Insurance for SNF coverage Care Coordinator Anticipated Follow Up Needs: HH/OP Expected length of stay: D/c 1/22  Clinical Impression Updated assessment  July Linam A Dinnis Rog 06/25/2023, 11:36 AM

## 2023-06-25 NOTE — Patient Care Conference (Signed)
 Inpatient RehabilitationTeam Conference and Plan of Care Update Date: 06/25/2023   Time: 11:08 AM   Patient Name: Leon Rodriguez      Medical Record Number: [id number]  Date of Birth: [birth year] Sex: Male         Room/Bed: 5T95R/5T95R-98 Payor Info: Payor:  [place] MEDICAID PREPAID HEALTH [place]AN / Plan: [place] MEDICAID HEALTHY BLUE / Product Type: *No Product type* /    Admit Date/Time:  06/18/2023  3:34 PM  Primary Diagnosis:  Cervical myelopathy [facility])  Hospital Problems: Principal Problem:   Cervical myelopathy [facility])    Expected Discharge Date: Expected Discharge Date: 07/17/23  Team Members Present: Physician leading conference: Dr. Duwaine Barrs Social Worker Present: Graeme Jude, LCSWA Nurse Present: Barnie Ronde, Darlin Boring, RN PT Present: Bevely Fonder, PT OT Present: Camie Hoe, OT;Charlena Cha, COTA PPS Coordinator present : Eleanor Colon, SLP     Current Status/Progress Goal Weekly Team Focus  Bowel/Bladder   pt continent of bowel and incontinent of urine. pt is a q4-6 cath/bladder scan for >350   Regain ability to urinate on his own   Assist with time toileting qshift and prn    Swallow/Nutrition/ Hydration               ADL's   BADLs at bed level with max A/tot A; ongoing BLE extensor and adductor tone; TB transfers with max A+2   mod A overall; bathing-min A   tone/pain mgmt, BADLs, transfers    Mobility   max x 1 supine to sit, max x 2 slide board transfer   mod A  WB standing for tone management, continuation of slide board transfers    Communication                Safety/Cognition/ Behavioral Observations               Pain   pt c/o neck pain, 7/10 when muscles are having spasms in L leg. Scheduled meds given (SEE MAR)   <3 pain score   Assess qshift and prn    Skin   Skin intact   Maintain skin intergrity  Assess qshift and prn      Discharge Planning:  D/c to home with s/o [name]. She is the only caregiver.  Reports her dtr can help PRN some evenings.Will confirm amount of support s/o can provide as initially she was returning to work on 07/10/23. SW will confirm there are no barriers to discharge.   Team Discussion: Cervical myelopathy. Bowel and bladder program.  Education started for cathing.  Usually does bowel program every other day but needs additional education as to the need of daily bowel program.  Was using enemas at home prior to admission. Upset that can't have Fleets enema every other day.  Pain managed with PRN and scheduled medications. Increasing tone with limited CSF flow to lower extremities.  Constipation also making spasticity worse. Blister to right heel with Aquacel and foam dressing. Max to total assist for ADLs at bed level, working on tone management.  Has trunk flexion when rolls in bed.  Patient on target to meet rehab goals: yes, progressing with discharge date of 07/17/23  *See Care Plan and progress notes for long and short-term goals.   Revisions to Treatment Plan:  Trial Dantrolene  but discontinued due to lethargy.  Baclofen  increased. Zanaflex  discontinued at patient request doesn't like it. Sorbitol  and mini enema added. Okay to give Fleets tonight but not every night. Cymbalta  added.  Will wean MS  Contin.  Teaching Needs: Medications, safety, self care, bowel/bladder training, transfer training, skin/wound care, etc.   Current Barriers to Discharge: Decreased caregiver support, Neurogenic bowel and bladder, Wound care, and Weight  Possible Resolutions to Barriers: Family education Independent with bowel and bladder program Independent with wound care to right heel Diet and lifestyle modification to improve overall health Order recommended DME     Medical Summary Current Status: started educationon cathing- and discussing bowel program- blister R heel- zanaflex  prn- doesn't like taking Zanaflex   Barriers to Discharge: Behavior/Mood;Medical  stability;Incontinence;Neurogenic Bowel & Bladder;Self-care education;Spasticity;Uncontrolled Pain  Barriers to Discharge Comments: limited by severe spasticity which is very severe/impacts care- and mood; Neurogenic bowel- also limited by lack of initiation Possible Resolutions to [employer] Focus: goals mod A- not taking Oxy, so will try to wean off MS COntin - working on possible ITB d/c 1/22   Continued Need for Acute Rehabilitation Level of Care: The patient requires daily medical management by a physician with specialized training in physical medicine and rehabilitation for the following reasons: Direction of a multidisciplinary physical rehabilitation program to maximize functional independence : Yes Medical management of patient stability for increased activity during participation in an intensive rehabilitation regime.: Yes Analysis of laboratory values and/or radiology reports with any subsequent need for medication adjustment and/or medical intervention. : Yes   I attest that I was present, lead the team conference, and concur with the assessment and plan of the team.   Darice LITTIE Boring 06/25/2023, 4:52 PM

## 2023-06-25 NOTE — Progress Notes (Signed)
 Occupational Therapy Session Note  Patient Details  Name: Leon Rodriguez MRN: [id number] Date of Birth: 1965-11-15  Today's Date: 06/25/2023 OT Individual Time: 1015-1100 OT Individual Time Calculation (min): 45 min    Short Term Goals: Week 1:   see week 2  Skilled Therapeutic Interventions/Progress Updates:      Therapy Documentation Precautions:  Precautions Precautions: Fall, Cervical Precaution Comments: hx of many falls, severe B LE hypertonia with muscle spasms, verbally went through cervical precautions throughout session Required Braces or Orthoses: Cervical Brace Cervical Brace: Hard collar, Other (comment) (OOB) Restrictions Weight Bearing Restrictions Per Provider Order: No Other Position/Activity Restrictions: cervical precautions General:  Pt supine in bed, asleep, upon OT arrival, agreeable to OT session once awake. Slightly easy to wake although groggy, taking a minute to get oriented.  Pain: 10/10 pain reported in BLE and beck, activity, intermittent rest breaks, distractions provided for pain management, pt reports tolerable to proceed.  Recent medication provided.  ADL: Bed mobility: total A +2 rolling, increased trunk flexion when rolling to Lt even with flat bed, using bed rails for support Grooming: SBA bed level for washing face and applying lotion to face Oral hygiene: SBA bed level, able to manipulate containers with BUE and brush teeth UB dressing: Mod A bed level, using bed rails to pull up while OT donning/doffing shirt over back, pt able to thread onto BUE LB dressing: total A for managing over feet and waist, bed level, rolling Lt and Rt to manage over waist UB Bathing: Mod A, assistance required for back, able to wash BUE and stomach bed level  Other Treatments: OT issued abduction pillow for LE in order to manage tone in LE.    Pt supine in bed with bed alarm activated, 2 bed rails up, call light within reach and 4Ps  assessed.   Therapy/Group: Individual Therapy  Camie Hoe, OTD, OTR/L 06/25/2023, 12:16 PM

## 2023-06-25 NOTE — Progress Notes (Signed)
 Occupational Therapy Session Note  Patient Details  Name: Leon Rodriguez MRN: [id number] Date of Birth: [birth year]  Today's Date: 06/25/2023 OT Individual Time: 9154-9054 OT Individual Time Calculation (min): 60 min  and Today's Date: 06/25/2023 OT Missed Time: 30 Minutes Missed Time Reason: Nursing care;Other (comment) (bedpan)   Short Term Goals: Week 1:     Skilled Therapeutic Interventions/Progress Updates:    Pt on bedpan upon arrival. Pt missed 30 mins skilled OT services at beginning of session 2/2 on bedpan and nursing care (see below). OT intervention with focus on bed mobility and BLE stretching/PROM for pain mgmt and tone mgmt. When rolling to Lt, pt demo trunk flexion and uses UE to assist with rolling. Pt requires min A to initiate rolling and max A to complete rolling to sidelying. Pt with increased BLE flexion when rolling in bed. Gentle stretching/PROM to tone mgmt. Pt remained in bed with all needs within reach. Bed alarm activated.   Therapy Documentation Precautions:  Precautions Precautions: Fall, Cervical Precaution Comments: hx of many falls, severe B LE hypertonia with muscle spasms, verbally went through cervical precautions throughout session Required Braces or Orthoses: Cervical Brace Cervical Brace: Hard collar, Other (comment) (OOB) Restrictions Weight Bearing Restrictions Per Provider Order: No Other Position/Activity Restrictions: cervical precautions General: General OT Amount of Missed Time: 30 Minutes Vital Signs:   Pain:  Pt grimacing throughout session, reporting he feels like he has a ball in his rectum and needs to have a BM. Pt with large BM previous night. RN performed digital stim with no results. Pt with increase BLE spasms with grimacing; PROM/stretching and repositioning   Therapy/Group: Individual Therapy  Maritza Debby Mare 06/25/2023, 9:44 AM

## 2023-06-25 NOTE — Progress Notes (Signed)
 Physical Therapy Session Note  Patient Details  Name: Leon Rodriguez MRN: [id number] Date of Birth: 1965-10-13  Today's Date: 06/25/2023 PT Individual Time: 8695-8582 PT Individual Time Calculation (min): 73 min   Short Term Goals: Week 1:  PT Short Term Goal 1 (Week 1): pt will perform Supine<>sit with mod a of 1 consistently PT Short Term Goal 2 (Week 1): Pt will perform sit<>stands using LRAD with mod A of  2 PT Short Term Goal 3 (Week 1): Pt will perform bed<>chair transfers using LRAD with mod A of 2 and +2 for safety as needed  Skilled Therapeutic Interventions/Progress Updates:      Therapy Documentation Precautions:  Precautions Precautions: Fall, Cervical Precaution Comments: hx of many falls, severe B LE hypertonia with muscle spasms, verbally went through cervical precautions throughout session Required Braces or Orthoses: Cervical Brace Cervical Brace: Hard collar, Other (comment) (OOB) Restrictions Weight Bearing Restrictions Per Provider Order: No Other Position/Activity Restrictions: cervical precautions  PT session with emphasis on transfer training and tone management. Pt requires max A x 1 with supine >sit with HOB elevated. PT donned ankle weights (4 > 8 lbs/leg) for tone management with transfers, pt demonstrated improvement. Pt engaged in blocked practice of slide board transfers from bed>TIS>mat table ~6 transfers totally. Initially pt max A x 2 and progressed to mod/max A x 1 with additional assist for board placement and stabilization with mod cues for head/hips relationship. PT replaced roho with hybrid roho to decrease difficulty with slide board transfers and cushion inflated. Pt returned to room and seated at bedside with all needs in reach and S/O present.    Therapy/Group: Individual Therapy  Bevely Fonder Bevely Fonder PT, DPT  06/25/2023, 3:46 PM

## 2023-06-25 NOTE — Progress Notes (Signed)
 Unable to cath patient on time due to therapy conflict. When RN arrived patient was in chair;cath attempted in tilt position;unable to fully empty bladder as PVR was . Patient was having back spasms and wanting to remain still. Maxi lift transfer will be completed in agreed upon hour time frame and patient will be cathed again to empty bladder.

## 2023-06-25 NOTE — Progress Notes (Signed)
 Patient ID: Leon Rodriguez, male   DOB: 07/28/1965, 57 y.o.   MRN: [id number]  SW met with pt in room to provide updates from team conference, d/c date 1/22, and encouragement to participate in bowel program to help refuse some of the discomfort he is experiencing. SW completed assessment with pt. Pt discussed initiating disability. Pt reports he has not applied for disability. SW informed will make a referral to [facility] for services.   *Pt s/o [name] arrived during end of discussion. SW reviewed above.   Graeme Jude, MSW, LCSW Office: [phone] Cell: [phone] Fax: [phone]

## 2023-06-26 DIAGNOSIS — G959 Disease of spinal cord, unspecified: Secondary | ICD-10-CM | POA: Diagnosis not present

## 2023-06-26 NOTE — Progress Notes (Signed)
 Patient able to complete lay to sit independently for the first time tonight

## 2023-06-26 NOTE — Progress Notes (Signed)
 Physical Therapy Session Note  Patient Details  Name: Leon Rodriguez MRN: [id number] Date of Birth: [birth year]  Today's Date: 06/26/2023 PT Individual Time: 1030-1130 PT Individual Time Calculation (min): 60 min   Short Term Goals: Week 1:  PT Short Term Goal 1 (Week 1): pt will perform Supine<>sit with mod a of 1 consistently PT Short Term Goal 2 (Week 1): Pt will perform sit<>stands using LRAD with mod A of  2 PT Short Term Goal 3 (Week 1): Pt will perform bed<>chair transfers using LRAD with mod A of 2 and +2 for safety as needed  Skilled Therapeutic Interventions/Progress Updates: Pt presented in TIS with SO present agreeable to therapy. Pt with significant ms spasms throughout session with 10# weights placed on BLE and repositioning as needed for tone management. Pt required modA to scoot anteriorly in w/c and Slide board placed total A. Pt completed Slide board transfer to R with maxA x1 (+2 present for safety). At [facility] pt worked on sitting balance and core activities for improved unsupported sitting. Pt provided with 5lb barbells and with PTA guarding posteriorly pt worked on forward alternating punches. Pt required minA due to increased L lateral lean but was able to correct with use of BUE and verbal cues. Pt noted increased discomfort in R elbow which pt states is new although noted slept in poor position last night. Pt also worked on reaching tasks pulling squigz from [employer] with pt able to perform with CGA and requiring use of UE for support. Practiced anterior scoot for repositioning with pt requiring max multimodal cues for leaning anteriorly to facilitate weight shift. Pt also worked on modified sit ups 2 x 10 for increased core recruitment. Pt requiring intermittent minA due to LOB to L. Pt required total A for Slide board set up and was able to completed Slide board transfer to level surface TIS with maxA x 1 and max cues for increased anterior weight shifting. Pt transported back to  room and completed Slide board transfer in same manner back to bed. Pt required maxA for sit to supine and cues for sequencing. Pt repositioned to comfort and left inb ed at end of session with call bell within reach and SO present.      Therapy Documentation Precautions:  Precautions Precautions: Fall, Cervical Precaution Comments: hx of many falls, severe B LE hypertonia with muscle spasms, verbally went through cervical precautions throughout session Required Braces or Orthoses: Cervical Brace Cervical Brace: Hard collar, Other (comment) (OOB) Restrictions Weight Bearing Restrictions Per Provider Order: No Other Position/Activity Restrictions: cervical precautions General:   Vital Signs:   Pain:   Mobility:   Locomotion :    Trunk/Postural Assessment :    Balance:   Exercises:   Other Treatments:      Therapy/Group: Individual Therapy  Carl Butner 06/26/2023, 4:20 PM

## 2023-06-26 NOTE — Progress Notes (Signed)
 PROGRESS NOTE   Subjective/Complaints:   Pt reports more awake today- (still closing eyes intermittently).  Per fiance' Catheter drains slow- and wondering how to fix this- also asking if will pee again- explained he's on Flomax - but cannot tell if will pee on his own again, nor if he will walk again- will likely need disability at least for the 1 year.  Had another BM yesterday- extra large after an enema (not mini enema- but regular type enema)- educated fiance' that we just don't want regular enemas daily- but can do ~ 1-2x/week- doesn't train gut habit- Also d/w her that constipation and infection and bladder full increase spasticity.   Pt felt spasms are worse, but fiance reports were yesterday BEFORE BM, but better afterwards and was able to massage legs- much easier.   Per fiance' woke up a lot overnight, pt only remembers 1x around 4:30am.    Also notes some blisters- L dorsum of foot and L knee.    ROS:    Pt denies SOB, abd pain, CP, N/V/C/D, and vision changes   Negative except for HPI  Objective:   No results found. Recent Labs    06/24/23 0528  WBC 6.2  HGB 10.9*  HCT 31.5*  PLT 461*    Recent Labs    06/24/23 0528  NA 139  K 4.1  CL 101  CO2 27  GLUCOSE 91  BUN 11  CREATININE 0.65  CALCIUM 9.7     Intake/Output Summary (Last 24 hours) at 06/26/2023 0809 Last data filed at 06/26/2023 0657 Gross per 24 hour  Intake 0 ml  Output 2485 ml  Net -2485 ml        Physical Exam: Vital Signs Blood pressure 115/75, pulse 85, temperature 97.8 F (36.6 C), resp. rate 18, height 5' 9 (1.753 m), weight 96.6 kg, SpO2 100%.      General: awake, alert, appropriate,fiance' at bedside;  NAD HENT: conjugate gaze; oropharynx moist CV: regular rate and rhythm; no JVD Pulmonary: CTA B/L; no W/R/R- good air movement GI: soft, NT, ND, (+)BS- more normoactive Psychiatric: appropriate- flat, but mor  einteractive Neurological: Ox3 but sleepy- but stayed awake better than yesterday Some extensor tone in RLE- but easy to do ROM of LLE- - MAS was 1+ in LLE- at rest Ext: no clubbing, cyanosis, or edema Skin- healing blister on top of L foot- looks great actually- a little open popped blister on L knee with foam dressing on both Musculoskeletal:     Cervical back: Neck supple. No tenderness.     Comments: Ue's 5-/5 throughout on exam B/L RLE- HF 2-/5; KE 2-/5; DF 4-/5; and PF 4-/5 LLE- HF 2-/5; KE 2-/5; DF 2/5; PF 2/5- spasticity still present Skin:    -large blood blister left heel--stable in appearance,  mepilex dressing in place.   Neurological:     Comments: Sensory level at T4 and below- MAS of 2-3 of LE's and extensor tone severe- tolerated ROM a bit better this am     Assessment/Plan: 1. Functional deficits which require 3+ hours per day of interdisciplinary therapy in a comprehensive inpatient rehab setting. Physiatrist is providing close team supervision and 24  hour management of active medical problems listed below. Physiatrist and rehab team continue to assess barriers to discharge/monitor patient progress toward functional and medical goals  Care Tool:  Bathing    Body parts bathed by patient: Face   Body parts bathed by helper: Right arm, Left arm, Chest, Abdomen, Front perineal area, Buttocks, Right upper leg, Left upper leg, Right lower leg, Left lower leg (wife assisted pt with bath prior to OT evaluation)     Bathing assist Assist Level: Total Assistance - Patient < 25%     Upper Body Dressing/Undressing Upper body dressing   What is the patient wearing?: Pull over shirt    Upper body assist Assist Level: Minimal Assistance - Patient > 75%    Lower Body Dressing/Undressing Lower body dressing      What is the patient wearing?: Pants, Incontinence brief     Lower body assist Assist for lower body dressing: Total Assistance - Patient < 25%      Toileting Toileting    Toileting assist Assist for toileting: 2 Helpers     Transfers Chair/bed transfer  Transfers assist     Chair/bed transfer assist level: Dependent - Patient 0%     Locomotion Ambulation   Ambulation assist   Ambulation activity did not occur: Safety/medical concerns (limited by extreme tone)          Walk 10 feet activity   Assist  Walk 10 feet activity did not occur: Safety/medical concerns        Walk 50 feet activity   Assist Walk 50 feet with 2 turns activity did not occur: Safety/medical concerns         Walk 150 feet activity   Assist Walk 150 feet activity did not occur: Safety/medical concerns         Walk 10 feet on uneven surface  activity   Assist Walk 10 feet on uneven surfaces activity did not occur: Safety/medical concerns         Wheelchair     Assist Is the patient using a wheelchair?: Yes Type of Wheelchair: Manual Wheelchair activity did not occur: Safety/medical concerns  Wheelchair assist level: Dependent - Patient 0% Max wheelchair distance: 150    Wheelchair 50 feet with 2 turns activity    Assist    Wheelchair 50 feet with 2 turns activity did not occur: Safety/medical concerns   Assist Level: Dependent - Patient 0%   Wheelchair 150 feet activity     Assist  Wheelchair 150 feet activity did not occur: Safety/medical concerns   Assist Level: Dependent - Patient 0%   Blood pressure 115/75, pulse 85, temperature 97.8 F (36.6 C), resp. rate 18, height 5' 9 (1.753 m), weight 96.6 kg, SpO2 100%.  Medical Problem List and Plan: 1. Functional deficits secondary to Cervical myelopathy s/p ACDF- back after sepsis             -patient may  shower             -ELOS/Goals: 3-4 weeks- min A hopefully Grounds pass ordered  D/c 07/17/23 -have counseled pt/wife about why CT myelo wasn't performed. Discussed spasticity.  D/w pt and fiance that [facility] doesn't think he could get ITB  pump due to lack of flow of CSF at distal end of Spinal cord- where he needs it most/due to more spasticity in legs than arms Con't CIR PT and OT 2.  Antithrombotics: -DVT/anticoagulation:  Pharmaceutical: Lovenox              -  antiplatelet therapy: N/A 3. Pain Management/spasticity:  MS contin  15 mg bid, Oxycodone  prn. Lidoderm  patch for local measures. Tylenol  650 mg qid.             --bilateral hip pain with attempts at rolling.              --on Baclofen  20 mg qid, Valium  5 mg qid with Zanaflex  4 mg qid prn for spasticity.  -12/27 Started Duloxetine  20 mg at bedtime 12/25- titrate up every 3-4 days as tolerated  -bumped baclofen  to 25mg  qid, consider scheduling tizanidine   -added gabapentin  100mg  tid for injury level/chest wall pain/spasticity 12/29- pushing antispasmodics given the severity of his spasticity. Continue to titrate neuro-sedation permitting -increased baclofen  to 30mg  qid 12/28 -continue valium , consider scheduled tizanidine  also -increased duloxetine  (now in am) to 30mg  -pt refuses to take gabapentin  as he doesn't want more potentially sedating medication--I stopped it yesterday 12/30- will add Dantrolene  50 mg at bedtime- explained it's gradual onset- will increase q5 days 12/31- will stop Dantrolene - so sleepy this AM- calling Dr Carollee to see what he thinks about placing ITB pump sooner than later? 1/1- educated fiance and pt that cannot do ITB pump due to lack of flow of CSF- p[er [facility]- reason couldn't get LP for CT myelogram Will wait for now on Keppra , but if sedation gets better, can try 4. Mood/Behavior/Sleep: LCSW to follow for evaluation and support.              -antipsychotic agents: N/A  -12/27--trazodone  and melatonin helpful 12/28 doing better with earlier scheduling of trazodone  and melatonin 5. Neuropsych/cognition: This patient is capable of making decisions on his own behalf. 6. Skin/Wound Care: Routine pressure relief measures.   -has developed left  heel sore d/t tone, struggles to fit in [facility]  -heel dressed, ordered prevalon boots so that he's better able to tolerate wear-- -12/29 prevalon boots in place this am--tolerating  7. Hyponatremia: continue to monitor Na weekly  12/30- Na 139 8. Klebsiella aerogenes bacteremia/sepsis /UTI: levaquin   -abx completed per ID recs--7 days 9. Neurogenic bowel: Continue Senna S 2 tabs BID. Miralax  daily-->has been incontinent of bowel and question due to antibiotics and/or laxatives --Suppository daily after supper-->has refused for past 3 nights.  --would prefer enema three times a week as at home. Have suggested using mini enema -12/29 had large bm in early morning 12/28  -refusing docusate enema some nights including last night 12/30- now full of stool- since refusing enema- and will give Sorbitol  and enema today, but pt needs to understand cannot refuse regularly 12/31- had extra large BM last night- willing to do enema and sorbitol  again today to empty out. 1/1- Pt and fiance now understand why need to do nightly- eventually they want bowel program in AM-  .  10. Neurogenic bladder: Foley was replaced on acute-->will d/c and start bladder program. (patient would like to resume) --Monitor voiding with PVR checks             --use coude cath and monitor for recurrent hematuria             --continue flomax \  12/29 cath volumes generally in range until this morning--continue current schedule  12/31- Cath volumes looking good- cathing q4 hours  1/1- per fiance' Draining slow/caths- will speak to nursing- on max dose of Flomax  to try and get pt to void.  11. 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 Spent prolonged time in room- also d/w nursing  LOS: 8 days A FACE TO FACE EVALUATION WAS PERFORMED  Syrus Nakama 06/26/2023, 8:09 AM

## 2023-06-26 NOTE — Progress Notes (Signed)
 Physical Therapy Session Note  Patient Details  Name: Leon Rodriguez MRN: [id number] Date of Birth: 11-Aug-1965  Today's Date: 06/26/2023 PT Individual Time: 1300-1419 PT Individual Time Calculation (min): 79 min   Short Term Goals: Week 1:  PT Short Term Goal 1 (Week 1): pt will perform Supine<>sit with mod a of 1 consistently PT Short Term Goal 2 (Week 1): Pt will perform sit<>stands using LRAD with mod A of  2 PT Short Term Goal 3 (Week 1): Pt will perform bed<>chair transfers using LRAD with mod A of 2 and +2 for safety as needed  Skilled Therapeutic Interventions/Progress Updates:      Therapy Documentation Precautions:  Precautions Precautions: Fall, Cervical Precaution Comments: hx of many falls, severe B LE hypertonia with muscle spasms, verbally went through cervical precautions throughout session Required Braces or Orthoses: Cervical Brace Cervical Brace: Hard collar, Other (comment) (OOB) Restrictions Weight Bearing Restrictions Per Provider Order: No Other Position/Activity Restrictions: cervical precautions  Pt received semi-reclined in bed, agreeable to PT and with unrated abdomen and B LE pain. Nurse and MD notified as pt reports increased pain and tightness over last 2 days. Pt pre-medicated and provided with heat and passive mobility for flexiblity. Pt with concerns regarding increased spasms and tightness, information relayed to MD and nurse. Pt also with excess drainage from left ankle wound, nurse notified and performed dressing change. Pt agreeable to sit edge of bed and and perform supine to sit to left side for practice in bilateral directions. Pt min A with supine to sit for trunk elevation and dependent for LE management. Pt educated on head/hips relationship and forward trunk flexion to assist with scooting hips forward at bedside. Pt returned to bed max A x 2 and left semi-reclined with all needs in reach and S/O present.     Therapy/Group: Individual  Therapy  Bevely Fonder Bevely Fonder PT, DPT  06/26/2023, 3:05 PM

## 2023-06-26 NOTE — Progress Notes (Signed)
 Occupational Therapy Session Note  Patient Details  Name: Leon Rodriguez MRN: [id number] Date of Birth: [birth year]  Today's Date: 06/26/2023 OT Individual Time: 9154-9044 OT Individual Time Calculation (min): 70 min    Short Term Goals: Week 1:     Skilled Therapeutic Interventions/Progress Updates:    Pt resting in bed upon arrival with SO present. Initial focus on BLE stretching/PROM to facilitate LB dressing and bed mobiulity/TB transfers. Dependent for LB dressing. Rolling R/L improved to mod A to facilitate pulling pants over hips. Supine>sidelying with mod A. Sitting balance EOB with CGA. Lateral lean to Rt for TB placement with mod A. TB transfer with max A+1 (+2 for safety). Once in w/c, focus on BLE PROM/stretching and positioning. Pt wanted to work on Probation [employer]. 5# barbell curls 3x10. Reviewed LTGs and continued discharge planning. Pt remained in TIS with all needs within reach. SO present.   Therapy Documentation Precautions:  Precautions Precautions: Fall, Cervical Precaution Comments: hx of many falls, severe B LE hypertonia with muscle spasms, verbally went through cervical precautions throughout session Required Braces or Orthoses: Cervical Brace Cervical Brace: Hard collar, Other (comment) (OOB) Restrictions Weight Bearing Restrictions Per Provider Order: No Other Position/Activity Restrictions: cervical precautions    Pain:  Pt grimaces during spasms but reports that pain is generally improved.  Therapy/Group: Individual Therapy  Maritza Debby Mare 06/26/2023, 10:00 AM

## 2023-06-27 DIAGNOSIS — G959 Disease of spinal cord, unspecified: Secondary | ICD-10-CM | POA: Diagnosis not present

## 2023-06-27 LAB — BASIC METABOLIC PANEL
Anion gap: 11 (ref 5–15)
BUN: 11 mg/dL (ref 6–20)
CO2: 26 mmol/L (ref 22–32)
Calcium: 9.6 mg/dL (ref 8.9–10.3)
Chloride: 102 mmol/L (ref 98–111)
Creatinine, Ser: 0.7 mg/dL (ref 0.61–1.24)
GFR, Estimated: >60 mL/min (ref >60)
Glucose, Bld: 107 mg/dL — ABNORMAL HIGH (ref 70–99)
Potassium: 3.8 mmol/L (ref 3.5–5.1)
Sodium: 139 mmol/L (ref 135–145)

## 2023-06-27 LAB — CBC
HCT: 32.1 % — ABNORMAL LOW (ref 39.0–52.0)
Hemoglobin: 11.2 g/dL — ABNORMAL LOW (ref 13.0–17.0)
MCH: 33.2 pg (ref 26.0–34.0)
MCHC: 34.9 g/dL (ref 30.0–36.0)
MCV: 95.3 fL (ref 80.0–100.0)
Platelets: 427 10*3/uL — ABNORMAL HIGH (ref 150–400)
RBC: 3.37 MIL/uL — ABNORMAL LOW (ref 4.22–5.81)
RDW: 12.9 % (ref 11.5–15.5)
WBC: 7.4 10*3/uL (ref 4.0–10.5)
nRBC: 0 % (ref 0.0–0.2)

## 2023-06-27 MED ORDER — FLEET ENEMA RE ENEM: 1.0000 | ENEMA | Freq: Every day | RECTAL | Status: DC | PRN

## 2023-06-27 MED ORDER — QUETIAPINE FUMARATE 50 MG PO TABS: 50.0000 mg | ORAL_TABLET | Freq: Every day | ORAL | Status: DC

## 2023-06-27 MED ORDER — QUETIAPINE FUMARATE 25 MG PO TABS
25.0000 mg | ORAL_TABLET | Freq: Every day | ORAL | Status: DC
  Administered 2023-06-27: 25 mg via ORAL
  Filled 2023-06-27: qty 1

## 2023-06-27 MED ORDER — BACLOFEN 10 MG PO TABS
10.0000 mg | ORAL_TABLET | Freq: Three times a day (TID) | ORAL | Status: DC | PRN
  Administered 2023-06-27 – 2023-07-11 (×12): 10 mg via ORAL
  Filled 2023-06-27 (×12): qty 1

## 2023-06-27 NOTE — Progress Notes (Signed)
 Occupational Therapy Session Note  Patient Details  Name: Leon Rodriguez MRN: [id number] Date of Birth: 07-25-1965  Today's Date: 06/27/2023 OT Individual Time: 0815-0900 OT Individual Time Calculation (min): 45 min  and Today's Date: 06/27/2023 OT Missed Time: 30 Minutes Missed Time Reason: Pain;Other (comment) (spasms and eating breakfast)   Short Term Goals: Week 2:  OT Short Term Goal 1 (Week 2): Pt will be able to tolerate sitting EOB with min A and no back support to wash UB with min A. OT Short Term Goal 1 - Progress (Week 2): Met OT Short Term Goal 2 (Week 2): Pt will be able to don a shirt with min A sitting EOB with no back support. OT Short Term Goal 2 - Progress (Week 2): Progressing toward goal OT Short Term Goal 3 (Week 2): Pt will be able wash LE from bed level with mod A. OT Short Term Goal 3 - Progress (Week 2): Progressing toward goal OT Short Term Goal 4 (Week 2): pt will be able to rise to stand in a standard stedy lift with max A of 2. OT Short Term Goal 4 - Progress (Week 2): Progressing toward goal  Skilled Therapeutic Interventions/Progress Updates:    Pt resting in bed upon arrival with SO present. SO and pt reported that pt had a terrible night with confusion and spasms throughout the night. Pt with significant increase in BLE spasms and reported abdominal spasms at level of injury. OTA able to palpate abdominal spasms with trunk flexion noted. BLE PROM/stertching with no appreciable improvement noted. Cervical collar adjusted. Pt unable to actively participate. Pt missed 30 mins skilled OT services. Pt remained in bed with SO assisting pt with breakfast.  Therapy Documentation Precautions:  Precautions Precautions: Fall, Cervical Precaution Comments: hx of many falls, severe B LE hypertonia with muscle spasms, verbally went through cervical precautions throughout session Required Braces or Orthoses: Cervical Brace Cervical Brace: Hard collar, Other (comment)  (OOB) Restrictions Weight Bearing Restrictions Per Provider Order: No Other Position/Activity Restrictions: cervical precautions General: General OT Amount of Missed Time: 30 Minutes Pain: Pt reports increased abdominal and BLE spasms (unrated); grimacing noted with spasms; repositioned and PROM/stretching and LPN admin meds during session.   Therapy/Group: Individual Therapy  Leon Rodriguez 06/27/2023, 9:23 AM

## 2023-06-27 NOTE — Progress Notes (Signed)
 Patient did not have bowel movement with bowel program. Now feels pain in LRQ of abdomen. Nurse administer sorbitol and pain medication.

## 2023-06-27 NOTE — Progress Notes (Signed)
 Occupational Therapy Weekly Progress Note  Patient Details  Name: Leon Rodriguez MRN: [id number] Date of Birth: 05-28-1966  Beginning of progress report period: June 20, 2023 End of progress report period: June 27, 2023  Patient has met 1 of 4 short term goals.  Progress has been slow with functional tasks since admission. Pt continues to experience significant LB/BLE tone and spasms inhibiting pt's ability to actively participate in bathing/dressing tasks at bed level. UB bathing/dressing seated in w/c with mod A. Pt has progressed with bed mobility and TB transfers and currently requires mod A for supoine>sit EOB and max A+1 for TB transfers. Pt's SO has been present and assisted pt with bathing/dressing tasks at bed level. Pt is tolerating sitting in TIS w/c for 3+ hours/day.  Patient continues to demonstrate the following deficits: muscle weakness and muscle joint tightness, decreased cardiorespiratoy endurance, abnormal tone and unbalanced muscle activation, and decreased sitting balance, decreased standing balance, decreased postural control, and decreased balance strategies and therefore will continue to benefit from skilled OT intervention to enhance overall performance with BADL and Reduce care partner burden.  Patient progressing toward long term goals..  Continue plan of care.  OT Short Term Goals Week 2:  OT Short Term Goal 1 (Week 2): Pt will be able to tolerate sitting EOB with min A and no back support to wash UB with min A. OT Short Term Goal 1 - Progress (Week 2): Met OT Short Term Goal 2 (Week 2): Pt will be able to don a shirt with min A sitting EOB with no back support. OT Short Term Goal 2 - Progress (Week 2): Progressing toward goal OT Short Term Goal 3 (Week 2): Pt will be able wash LE from bed level with mod A. OT Short Term Goal 3 - Progress (Week 2): Progressing toward goal OT Short Term Goal 4 (Week 2): pt will be able to rise to stand in a standard stedy lift  with max A of 2. OT Short Term Goal 4 - Progress (Week 2): Progressing toward goal Week 3:  OT Short Term Goal 1 (Week 3): Pt will be able to don a shirt with min A sitting EOB with no back support. OT Short Term Goal 2 (Week 3): Pt will be able wash LE from bed level with mod A. OT Short Term Goal 3 (Week 3): pt will be able to rise to stand in a standard stedy lift with max A of 2. OT Short Term Goal 4 (Week 3): Pt will perform TB transfers with mod A+1 on a level surface  Camie Hoe, OTD, OTR/L Maritza Debby Mare 06/27/2023, 6:47 AM

## 2023-06-27 NOTE — Progress Notes (Signed)
 PROGRESS NOTE   Subjective/Complaints:   Pt and fiance' reports didn't have BM last night- got Sorbitol  this AM- no results with bowel program.  Was confused last might per fiance'- took collar off- and per fiance' this occurs a lot.   Had really bad spasms this AM- but thinks due to lack of BM.   Said didn't have caths for hours' last night- however cath at 2300- and then next at 4:45am.   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   Also foot on L bothering him.  Also reports doesn't have dig stim orders- his fiance said she did it though- no results.    ROS:    Pt denies SOB,  (+)abd pain, CP, N/V/ (+)C/D, and vision changes   Negative except for HPI  Objective:   No results found. Recent Labs    06/27/23 0557  WBC 7.4  HGB 11.2*  HCT 32.1*  PLT 427*    Recent Labs    06/27/23 0557  NA 139  K 3.8  CL 102  CO2 26  GLUCOSE 107*  BUN 11  CREATININE 0.70  CALCIUM 9.6     Intake/Output Summary (Last 24 hours) at 06/27/2023 9177 Last data filed at 06/27/2023 0445 Gross per 24 hour  Intake --  Output 2000 ml  Net -2000 ml        Physical Exam: Vital Signs Blood pressure 102/73, pulse 84, temperature 97.9 F (36.6 C), temperature source Oral, resp. rate 16, height 5' 9 (1.753 m), weight 92.5 kg, SpO2 98%.       General: awake, but sleepy- in and out- fiance at bedside; c/o constipation; NAD HENT: conjugate gaze; oropharynx moist CV: regular rate and rhythm; no JVD Pulmonary: CTA B/L; no W/R/R- good air movement GI: pretty firm- NT- not actually distended- but hypoactive BS Psychiatric: appropriate Neurological: Ox3 Tone not quite as bad this AM Some extensor tone in RLE- but easy to do ROM of LLE- - MAS was 1+ in LLE- at  rest Ext: no clubbing, cyanosis, or edema Skin- healing blister on top of L foot- looks great actually- a little open popped blister on L knee with foam dressing on both Musculoskeletal:     Cervical back: Neck supple. No tenderness.     Comments: Ue's 5-/5 throughout on exam B/L RLE- HF 2-/5; KE 2-/5; DF 4-/5; and PF 4-/5 LLE- HF 2-/5; KE 2-/5; DF 2/5; PF 2/5- spasticity still present Skin:    -large blood blister left heel--stable in appearance,  mepilex dressing in place.   Neurological:     Comments: Sensory level at T4 and below- MAS of 2-3 of LE's and extensor tone severe- tolerated ROM a bit better this am     Assessment/Plan: 1. Functional deficits which require 3+ hours per day of interdisciplinary therapy in a comprehensive inpatient rehab setting. Physiatrist is providing close team supervision and 24 hour management of active medical problems listed below. Physiatrist and rehab team continue to assess barriers to discharge/monitor patient progress toward  functional and medical goals  Care Tool:  Bathing    Body parts bathed by patient: Face   Body parts bathed by helper: Right arm, Left arm, Chest, Abdomen, Front perineal area, Buttocks, Right upper leg, Left upper leg, Right lower leg, Left lower leg (wife assisted pt with bath prior to OT evaluation)     Bathing assist Assist Level: Total Assistance - Patient < 25%     Upper Body Dressing/Undressing Upper body dressing   What is the patient wearing?: Pull over shirt    Upper body assist Assist Level: Minimal Assistance - Patient > 75%    Lower Body Dressing/Undressing Lower body dressing      What is the patient wearing?: Pants, Incontinence brief     Lower body assist Assist for lower body dressing: Total Assistance - Patient < 25%     Toileting Toileting    Toileting assist Assist for toileting: 2 Helpers     Transfers Chair/bed transfer  Transfers assist     Chair/bed transfer assist  level: Dependent - Patient 0%     Locomotion Ambulation   Ambulation assist   Ambulation activity did not occur: Safety/medical concerns (limited by extreme tone)          Walk 10 feet activity   Assist  Walk 10 feet activity did not occur: Safety/medical concerns        Walk 50 feet activity   Assist Walk 50 feet with 2 turns activity did not occur: Safety/medical concerns         Walk 150 feet activity   Assist Walk 150 feet activity did not occur: Safety/medical concerns         Walk 10 feet on uneven surface  activity   Assist Walk 10 feet on uneven surfaces activity did not occur: Safety/medical concerns         Wheelchair     Assist Is the patient using a wheelchair?: Yes Type of Wheelchair: Manual Wheelchair activity did not occur: Safety/medical concerns  Wheelchair assist level: Dependent - Patient 0% Max wheelchair distance: 150    Wheelchair 50 feet with 2 turns activity    Assist    Wheelchair 50 feet with 2 turns activity did not occur: Safety/medical concerns   Assist Level: Dependent - Patient 0%   Wheelchair 150 feet activity     Assist  Wheelchair 150 feet activity did not occur: Safety/medical concerns   Assist Level: Dependent - Patient 0%   Blood pressure 102/73, pulse 84, temperature 97.9 F (36.6 C), temperature source Oral, resp. rate 16, height 5' 9 (1.753 m), weight 92.5 kg, SpO2 98%.  Medical Problem List and Plan: 1. Functional deficits secondary to Cervical myelopathy s/p ACDF- back after sepsis             -patient may  shower             -ELOS/Goals: 3-4 weeks- min A hopefully Grounds pass ordered  D/c 07/17/23 -have counseled pt/wife about why CT myelo wasn't performed. Discussed spasticity.  D/w pt and fiance that [facility] doesn't think he could get ITB pump due to lack of flow of CSF at distal end of Spinal cord- where he needs it most/due to more spasticity in legs than arms Con't CIR PT and  OT  2.  Antithrombotics: -DVT/anticoagulation:  Pharmaceutical: Lovenox              -antiplatelet therapy: N/A 3. Pain Management/spasticity:  MS contin  15 mg bid, Oxycodone  prn. Lidoderm   patch for local measures. Tylenol  650 mg qid.             --bilateral hip pain with attempts at rolling.              --on Baclofen  20 mg qid, Valium  5 mg qid with Zanaflex  4 mg qid prn for spasticity.  -12/27 Started Duloxetine  20 mg at bedtime 12/25- titrate up every 3-4 days as tolerated  -bumped baclofen  to 25mg  qid, consider scheduling tizanidine   -added gabapentin  100mg  tid for injury level/chest wall pain/spasticity 12/29- pushing antispasmodics given the severity of his spasticity. Continue to titrate neuro-sedation permitting -increased baclofen  to 30mg  qid 12/28 -continue valium , consider scheduled tizanidine  also -increased duloxetine  (now in am) to 30mg  -pt refuses to take gabapentin  as he doesn't want more potentially sedating medication--I stopped it yesterday 12/30- will add Dantrolene  50 mg at bedtime- explained it's gradual onset- will increase q5 days 12/31- will stop Dantrolene - so sleepy this AM- calling Dr Carollee to see what he thinks about placing ITB pump sooner than later? 1/1- educated fiance and pt that cannot do ITB pump due to lack of flow of CSF- p[er [facility]- reason couldn't get LP for CT myelogram Will wait for now on Keppra , but if sedation gets better, can try 1/2- sinc ehaving confusion, will stop Zanaflex - in rare cases, can cause hallucinations (I've sene in other patients) and will give more baclofen  prn 10 mg TID prn 4. Mood/Behavior/Sleep: LCSW to follow for evaluation and support.              -antipsychotic agents: N/A  -12/27--trazodone  and melatonin helpful 12/28 doing better with earlier scheduling of trazodone  and melatonin 1/2- having some confusion at night- not sure if due to Zanaflex , which I sotpped, but will give Seroquel  25 mg at bedtime to help as well -d/c  trazodone  5. Neuropsych/cognition: This patient is capable of making decisions on his own behalf. 6. Skin/Wound Care: Routine pressure relief measures.   -has developed left heel sore d/t tone, struggles to fit in [facility]  -heel dressed, ordered prevalon boots so that he's better able to tolerate wear-- -12/29 prevalon boots in place this am--tolerating  7. Hyponatremia: continue to monitor Na weekly  12/30- Na 139 8. Klebsiella aerogenes bacteremia/sepsis /UTI: levaquin   -abx completed per ID recs--7 days 9. Neurogenic bowel: Continue Senna S 2 tabs BID. Miralax  daily-->has been incontinent of bowel and question due to antibiotics and/or laxatives --Suppository daily after supper-->has refused for past 3 nights.  --would prefer enema three times a week as at home. Have suggested using mini enema -12/29 had large bm in early morning 12/28  -refusing docusate enema some nights including last night 12/30- now full of stool- since refusing enema- and will give Sorbitol  and enema today, but pt needs to understand cannot refuse regularly 12/31- had extra large BM last night- willing to do enema and sorbitol  again today to empty out. 1/1- Pt and fiance now understand why need to do nightly- eventually they want bowel program in AM- 1/2- Didn't have dig stim written for- will add to notes- no results last night- abd firm- will give sorbitol - will order Fleet's enema daily prn for severe constipation.   .  10. Neurogenic bladder: Foley was replaced on acute-->will d/c and start bladder program. (patient would like to resume) --Monitor voiding with PVR checks             --use coude cath and monitor for recurrent hematuria             --  continue flomax \  12/29 cath volumes generally in range until this morning--continue current schedule  12/31- Cath volumes looking good- cathing q4 hours  1/1- per fiance' Draining slow/caths- will speak to nursing- on max dose of Flomax  to try and get pt to void.    1/2- Pt's cathing is doing well- went 1 time 5 hours 45 minutes last night- but fiance concerned they are waiting for her to do- will need help with this.  11. 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    LOS: 9 days A FACE TO FACE EVALUATION WAS PERFORMED  Dorcus Riga 06/27/2023, 8:22 AM

## 2023-06-27 NOTE — Progress Notes (Signed)
 Physical Therapy Weekly Progress Note  Patient Details  Name: Leon Rodriguez MRN: [id number] Date of Birth: [birth year]  Beginning of progress report period: June 20, 2023 End of progress report period: June 27, 2023  Today's Date: 06/27/2023 PT Individual Time: 1005-1110, 8584-8476 PT Individual Time Calculation (min): 65 min, 75 min   Patient has met 1 of 3 short term goals.  Pt is progressing slowly toward goals, but is severely limited by tone and spasms. Pt demoes near constant BLE spasms, L>R. He is able to transfer with max a, nearing mod a with +2 for safety and slideboard transfer. Pt tolerates sitting up in TIS chair and would like to use his personal manual chair, but is limited by extensor tone.   Patient continues to demonstrate the following deficits decreased cardiorespiratoy endurance, impaired timing and sequencing, abnormal tone, unbalanced muscle activation, and decreased coordination, and decreased sitting balance, decreased standing balance, decreased postural control, decreased balance strategies, and difficulty maintaining precautions and therefore will continue to benefit from skilled PT intervention to increase functional independence with mobility.  Patient progressing toward long term goals..  Continue plan of care.  PT Short Term Goals Week 1:  PT Short Term Goal 1 (Week 1): pt will perform Supine<>sit with mod a of 1 consistently PT Short Term Goal 1 - Progress (Week 1): Progressing toward goal PT Short Term Goal 2 (Week 1): Pt will perform sit<>stands using LRAD with mod A of  2 PT Short Term Goal 2 - Progress (Week 1): Not met PT Short Term Goal 3 (Week 1): Pt will perform bed<>chair transfers using LRAD with mod A of 2 and +2 for safety as needed Week 2:  PT Short Term Goal 1 (Week 2): pt will perform SBT with mod a consistently PT Short Term Goal 2 (Week 2): Pt will tolerate standing with assist or standing frame x 3 min PT Short Term Goal 3 (Week 2):  Pt will verbalize pressure relief frequency and duration  Skilled Therapeutic Interventions/Progress Updates:    Session 1: pt received in bed and agreeable to therapy. Pt reports unrated pain with spasms consistently throughout session, premedicated. Rest and positioning provided as needed.   Pt performed sup>sit with max a for BLE only, able to manage trunk with CGA. slideboard transfer x 3 with max a nearing mod a for some scoots with +2 standing by for safety and stabilizing board. Cues and facilitation for adequate anterior weight shift, but pt with good UE strength and able to muscle through to complete transfer without clearing buttocks from board.   Pt participated in anterior leaning in w/c and seated EOM with CGA-supervision +2. Pt completed reaching for socks and catching himself on BUE in case of posterior LOB for safety and improved independence with transfers.   Pt returned to bed after session d/t being due to cath. Pt was left with all needs in reach and alarm active.   Session 2:  pt received in bed and agreeable to therapy. Pt in the process of sitting to EOB with supervision on arrival, needed assist only to adjust bed. slideboard transfer to TIS w/c with max x 1 and +2 to stabilize board. Pt transported to therapy gym for time management and energy conservation.   Pt participated in standing frame for up to 2 min, including bouts of x5 Sit to stand from sling ~80% elevated. Pt c/o of R elbow pain like I had surgery. Rest and positioning as needed. Note L>R glute activation,  no active quads noted at this time.   During rest breaks, discussed options for specialty w/c, including options for tilt, which pt states he is not sure he needs. Plan to attempt sitting up straighter and monitoring for tone causing him to slide forward in chair. Pt returned to room and remained in TIS, was left with all needs in reach and alarm active.   Therapy Documentation Precautions:   Precautions Precautions: Fall, Cervical Precaution Comments: hx of many falls, severe B LE hypertonia with muscle spasms, verbally went through cervical precautions throughout session Required Braces or Orthoses: Cervical Brace Cervical Brace: Hard collar, Other (comment) (OOB) Restrictions Weight Bearing Restrictions Per Provider Order: No Other Position/Activity Restrictions: cervical precautions General:      Therapy/Group: Individual Therapy  [name] 06/27/2023, 12:16 PM

## 2023-06-28 DIAGNOSIS — F4321 Adjustment disorder with depressed mood: Secondary | ICD-10-CM

## 2023-06-28 DIAGNOSIS — G959 Disease of spinal cord, unspecified: Secondary | ICD-10-CM | POA: Diagnosis not present

## 2023-06-28 MED ORDER — QUETIAPINE FUMARATE 50 MG PO TABS
50.0000 mg | ORAL_TABLET | Freq: Every day | ORAL | Status: DC
  Administered 2023-06-28: 50 mg via ORAL
  Filled 2023-06-28: qty 1

## 2023-06-28 NOTE — Progress Notes (Signed)
 PROGRESS NOTE   Subjective/Complaints:  Pt reports up since 4:30am - asking to get on EOB to eat breakfast. With fiance' at bedside.  Per fiance' took off all his clothes last night- but calmer- not agitated/angry-  Had BM last night- per chart medium BM.   Caths going well- ~400cc every time.    ROS:    Pt denies SOB, abd pain, CP, N/V/C/D, and vision changes   Negative except for HPI  Objective:   No results found. Recent Labs    06/27/23 0557  WBC 7.4  HGB 11.2*  HCT 32.1*  PLT 427*    Recent Labs    06/27/23 0557  NA 139  K 3.8  CL 102  CO2 26  GLUCOSE 107*  BUN 11  CREATININE 0.70  CALCIUM 9.6     Intake/Output Summary (Last 24 hours) at 06/28/2023 1414 Last data filed at 06/28/2023 1304 Gross per 24 hour  Intake 600 ml  Output 1950 ml  Net -1350 ml        Physical Exam: Vital Signs Blood pressure 100/72, pulse 84, temperature 98 F (36.7 C), temperature source Oral, resp. rate 18, height 5' 9 (1.753 m), weight 92.5 kg, SpO2 99%.        General: awake, alert, calm- asking to move up in bed; fiance' at bedside; NAD HENT: conjugate gaze; oropharynx dry CV: regular rate and rhythm; no JVD Pulmonary: CTA B/L; no W/R/R- good air movement GI: soft, NT, ND, (+)BS Psychiatric: extremely flat affect Neurological: alert- but somewhat sleepy Tone really bad this AM- severe extensor tone- cannot break tone on RLE- so looks 4 but is extensor tone- and LLE- MAS of 3 Ext: no clubbing, cyanosis, or edema Skin- healing blister on top of L foot- looks great actually- a little open popped blister on L knee with foam dressing on both Musculoskeletal:     Cervical back: Neck supple. No tenderness.     Comments: Ue's 5-/5 throughout on exam B/L RLE- HF 2-/5; KE 2-/5; DF 4-/5; and PF 4-/5 LLE- HF 2-/5; KE 2-/5; DF 2/5; PF 2/5- spasticity still present Skin:    -large blood blister left heel--stable  in appearance,  mepilex dressing in place.   Neurological:     Comments: Sensory level at T4 and below- MAS of 2-3 of LE's and extensor tone severe- tolerated ROM a bit better this am     Assessment/Plan: 1. Functional deficits which require 3+ hours per day of interdisciplinary therapy in a comprehensive inpatient rehab setting. Physiatrist is providing close team supervision and 24 hour management of active medical problems listed below. Physiatrist and rehab team continue to assess barriers to discharge/monitor patient progress toward functional and medical goals  Care Tool:  Bathing    Body parts bathed by patient: Face   Body parts bathed by helper: Right arm, Left arm, Chest, Abdomen, Front perineal area, Buttocks, Right upper leg, Left upper leg, Right lower leg, Left lower leg (wife assisted pt with bath prior to OT evaluation)     Bathing assist Assist Level: Total Assistance - Patient < 25%     Upper Body Dressing/Undressing Upper body dressing   What  is the patient wearing?: Pull over shirt    Upper body assist Assist Level: Minimal Assistance - Patient > 75%    Lower Body Dressing/Undressing Lower body dressing      What is the patient wearing?: Pants, Incontinence brief     Lower body assist Assist for lower body dressing: Total Assistance - Patient < 25%     Toileting Toileting    Toileting assist Assist for toileting: 2 Helpers     Transfers Chair/bed transfer  Transfers assist     Chair/bed transfer assist level: 2 Helpers     Locomotion Ambulation   Ambulation assist   Ambulation activity did not occur: Safety/medical concerns (limited by extreme tone)          Walk 10 feet activity   Assist  Walk 10 feet activity did not occur: Safety/medical concerns        Walk 50 feet activity   Assist Walk 50 feet with 2 turns activity did not occur: Safety/medical concerns         Walk 150 feet activity   Assist Walk 150  feet activity did not occur: Safety/medical concerns         Walk 10 feet on uneven surface  activity   Assist Walk 10 feet on uneven surfaces activity did not occur: Safety/medical concerns         Wheelchair     Assist Is the patient using a wheelchair?: Yes Type of Wheelchair: Manual Wheelchair activity did not occur: Safety/medical concerns  Wheelchair assist level: Dependent - Patient 0% Max wheelchair distance: 150    Wheelchair 50 feet with 2 turns activity    Assist    Wheelchair 50 feet with 2 turns activity did not occur: Safety/medical concerns   Assist Level: Dependent - Patient 0%   Wheelchair 150 feet activity     Assist  Wheelchair 150 feet activity did not occur: Safety/medical concerns   Assist Level: Dependent - Patient 0%   Blood pressure 100/72, pulse 84, temperature 98 F (36.7 C), temperature source Oral, resp. rate 18, height 5' 9 (1.753 m), weight 92.5 kg, SpO2 99%.  Medical Problem List and Plan: 1. Functional deficits secondary to Cervical myelopathy s/p ACDF- back after sepsis             -patient may  shower             -ELOS/Goals: 3-4 weeks- min A hopefully- likely mod to max A Grounds pass ordered  D/c 07/17/23 -have counseled pt/wife about why CT myelo wasn't performed. Discussed spasticity.  D/w pt and fiance that [facility] doesn't think he could get ITB pump due to lack of flow of CSF at distal end of Spinal cord- where he needs it most/due to more spasticity in legs than arms Con't CIR PT and OT Neuropsych to see today 2.  Antithrombotics: -DVT/anticoagulation:  Pharmaceutical: Lovenox              -antiplatelet therapy: N/A 3. Pain Management/spasticity:  MS contin  15 mg bid, Oxycodone  prn. Lidoderm  patch for local measures. Tylenol  650 mg qid.             --bilateral hip pain with attempts at rolling.              --on Baclofen  20 mg qid, Valium  5 mg qid with Zanaflex  4 mg qid prn for spasticity.  -12/27 Started  Duloxetine  20 mg at bedtime 12/25- titrate up every 3-4 days as tolerated  -bumped baclofen  to  25mg  qid, consider scheduling tizanidine   -added gabapentin  100mg  tid for injury level/chest wall pain/spasticity 12/29- pushing antispasmodics given the severity of his spasticity. Continue to titrate neuro-sedation permitting -increased baclofen  to 30mg  qid 12/28 -continue valium , consider scheduled tizanidine  also -increased duloxetine  (now in am) to 30mg  -pt refuses to take gabapentin  as he doesn't want more potentially sedating medication--I stopped it yesterday 12/30- will add Dantrolene  50 mg at bedtime- explained it's gradual onset- will increase q5 days 12/31- will stop Dantrolene - so sleepy this AM- calling Dr Carollee to see what he thinks about placing ITB pump sooner than later? 1/1- educated fiance and pt that cannot do ITB pump due to lack of flow of CSF- p[er [facility]- reason couldn't get LP for CT myelogram Will wait for now on Keppra , but if sedation gets better, can try 1/2- sinc ehaving confusion, will stop Zanaflex - in rare cases, can cause hallucinations (I've sene in other patients) and will give more baclofen  prn 10 mg TID prn 1/3- tone if anything looking worse- don't know what else can do right now except Keppra , but will need to reduce Valium , or something else to try the Keppra -  4. Mood/Behavior/Sleep: LCSW to follow for evaluation and support.              -antipsychotic agents: N/A  -12/27--trazodone  and melatonin helpful 12/28 doing better with earlier scheduling of trazodone  and melatonin 1/2- having some confusion at night- not sure if due to Zanaflex , which I sotpped, but will give Seroquel  25 mg at bedtime to help as well -d/c trazodone  1/3- will increase Seorquel to 50 mg at bedtime- took clothes off last night- got confused, but calmer, per fiance'.  5. Neuropsych/cognition: This patient is? capable of making decisions on his own behalf. 6. Skin/Wound Care: Routine  pressure relief measures.   -has developed left heel sore d/t tone, struggles to fit in [facility]  -heel dressed, ordered prevalon boots so that he's better able to tolerate wear-- -12/29 prevalon boots in place this am--tolerating  7. Hyponatremia: continue to monitor Na weekly  12/30- Na 139 8. Klebsiella aerogenes bacteremia/sepsis /UTI: levaquin   -abx completed per ID recs--7 days 9. Neurogenic bowel: Continue Senna S 2 tabs BID. Miralax  daily-->has been incontinent of bowel and question due to antibiotics and/or laxatives --Suppository daily after supper-->has refused for past 3 nights.  --would prefer enema three times a week as at home. Have suggested using mini enema -12/29 had large bm in early morning 12/28  -refusing docusate enema some nights including last night 12/30- now full of stool- since refusing enema- and will give Sorbitol  and enema today, but pt needs to understand cannot refuse regularly 12/31- had extra large BM last night- willing to do enema and sorbitol  again today to empty out. 1/1- Pt and fiance now understand why need to do nightly- eventually they want bowel program in AM- 1/2- Didn't have dig stim written for- will add to notes- no results last night- abd firm- will give sorbitol - will order Fleet's enema daily prn for severe constipation.    1/3- LBM medium yesterday with bowel program  10. Neurogenic bladder: Foley was replaced on acute-->will d/c and start bladder program. (patient would like to resume) --Monitor voiding with PVR checks             --use coude cath and monitor for recurrent hematuria             --continue flomax \  12/29 cath volumes generally in range until this  morning--continue current schedule  12/31- Cath volumes looking good- cathing q4 hours  1/1- per fiance' Draining slow/caths- will speak to nursing- on max dose of Flomax  to try and get pt to void.   1/2- Pt's cathing is doing well- went 1 time 5 hours 45 minutes last night- but  fiance concerned they are waiting for her to do- will need help with this.   1/3- cath volumes going well  11. 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

## 2023-06-28 NOTE — Progress Notes (Signed)
 Physical Therapy Session Note  Patient Details  Name: Leon Rodriguez MRN: [id number] Date of Birth: 1966/01/09  Today's Date: 06/28/2023 PT Individual Time: 8694-8584 PT Individual Time Calculation (min): 70 min   Short Term Goals: Week 2:  PT Short Term Goal 1 (Week 2): pt will perform SBT with mod a consistently PT Short Term Goal 2 (Week 2): Pt will tolerate standing with assist or standing frame x 3 min PT Short Term Goal 3 (Week 2): Pt will verbalize pressure relief frequency and duration  Skilled Therapeutic Interventions/Progress Updates: Pt presented in bed with SO present agreeable to therapy. Pt c/o unrated pain more significant with spasms. Per pt spasms decreased from am. Pt requesting transfer to TIS. Completed supine to sit with modA primarily for BLE management. Pt continues to require cues for reaching forward as well as leaning anteriorly. Pt required total A for Slide board set up, bed raised to make level surface with TIS. With max multimodal cues pt was able to complete Slide board transfer with minA! Pt transported to main gym and completed transfer in same manner as prior. Pt continues to require reinforcement for anterior leaning and placing hands next to self vs behind. At [facility] pt worked on dynamic sitting balance activities including use of rebounder and 1kG weighted ball. Pt required minA and use of mirror for visual feedback to attain and maintain midline as pt had tendency to lean laterally both L and R. Pt was able to complete 2 x 15 with CGA primarily with intermittent minA for balance correction. Pt also participated in basketball toss with pt attempting to throw with BUE however noted using contrlateral extremity for support. Pt also participated in reaching activity  grabbing and placing red and green clothespins on basketball net. Pt demonstrated most difficulty with this task due to increased effort leaning anterior. Pt then completed Slide board transfer back to TIS,  total A for Slide board set up and light modA to complete transfer to TIS. Pt then transported to day room and set up at [employer]. Pt participated in AAROM  2 x 10 cycles with pt requiring assist for LLE activation but was able to demonstrate some glute activation when RLE was removed from plate (pt was able to initiate push down). Pt transported back to room at end of session and requested to remain in TIS. Pt left in TIS with SO present and current needs met.      Therapy Documentation Precautions:  Precautions Precautions: Fall, Cervical Precaution Comments: hx of many falls, severe B LE hypertonia with muscle spasms, verbally went through cervical precautions throughout session Required Braces or Orthoses: Cervical Brace Cervical Brace: Hard collar, Other (comment) (OOB) Restrictions Weight Bearing Restrictions Per Provider Order: No Other Position/Activity Restrictions: cervical precautions General:   Vital Signs: Therapy Vitals Temp: 98.6 F (37 C) Temp Source: Oral Pulse Rate: 99 Resp: 19 BP: 110/79 Patient Position (if appropriate): Sitting Oxygen Therapy SpO2: 97 % O2 Device: Room Air   Therapy/Group: Individual Therapy  Leon Rodriguez 06/28/2023, 4:11 PM

## 2023-06-28 NOTE — Progress Notes (Signed)
 Physical Therapy Session Note  Patient Details  Name: LAMOUNT BANKSON MRN: [id number] Date of Birth: [birth year]  Today's Date: 06/28/2023 PT Individual Time: 1100-1200 PT Individual Time Calculation (min): 60 min   Short Term Goals: Week 2:  PT Short Term Goal 1 (Week 2): pt will perform SBT with mod a consistently PT Short Term Goal 2 (Week 2): Pt will tolerate standing with assist or standing frame x 3 min PT Short Term Goal 3 (Week 2): Pt will verbalize pressure relief frequency and duration  Skilled Therapeutic Interventions/Progress Updates:    pt received in bed and agreeable to therapy. Pain largely controlled this session, premedicated. Rest and positioning provided as needed.   Supine>sit with mod a d/t increased spasms this am. slideboard transfer  x 4 with mod-max a +2 with max cueing for anterior weight shifting. When leaning forward, pt is able to produce more lift vs sliding across board.  Session focused on leaning and reaching outside BOS for core strength and dynamic balance. Pt able to lean onto elbows laterally and posterior with CGA. Assist to maintain BLE positioning against tone, +2 standing by for safety.  Pt returned to room and found to have incontinent BM. Pt's wife assisted with hygiene, tot a. Pt remained in bed and was left with all needs in reach and alarm active.   Therapy Documentation Precautions:  Precautions Precautions: Fall, Cervical Precaution Comments: hx of many falls, severe B LE hypertonia with muscle spasms, verbally went through cervical precautions throughout session Required Braces or Orthoses: Cervical Brace Cervical Brace: Hard collar, Other (comment) (OOB) Restrictions Weight Bearing Restrictions Per Provider Order: No Other Position/Activity Restrictions: cervical precautions General:      Therapy/Group: Individual Therapy  [name] 06/28/2023, 4:32 PM

## 2023-06-28 NOTE — Progress Notes (Signed)
 Occupational Therapy Session Note  Patient Details  Name: Leon Rodriguez MRN: [id number] Date of Birth: 10/15/1965  Today's Date: 06/28/2023 OT Individual Time: 0900-1013 OT Individual Time Calculation (min): 73 min    Short Term Goals: Week 2:  OT Short Term Goal 1 (Week 2): Pt will be able to tolerate sitting EOB with min A and no back support to wash UB with min A. OT Short Term Goal 1 - Progress (Week 2): Met OT Short Term Goal 2 (Week 2): Pt will be able to don a shirt with min A sitting EOB with no back support. OT Short Term Goal 2 - Progress (Week 2): Progressing toward goal OT Short Term Goal 3 (Week 2): Pt will be able wash LE from bed level with mod A. OT Short Term Goal 3 - Progress (Week 2): Progressing toward goal OT Short Term Goal 4 (Week 2): pt will be able to rise to stand in a standard stedy lift with max A of 2. OT Short Term Goal 4 - Progress (Week 2): Progressing toward goal  Skilled Therapeutic Interventions/Progress Updates:    Pt in w/c upon arrival with SO present. Pt and SO stated pt already dressed with clean clothes. OT intervention with focus on BUE strengthening, w/c tolerance in upright position, discharge planning, and activity tolerance to increase independence with BADLs. Transition to ortho gym. Initial focus sitting in upright position at [employer] 2x5 mins random level 3 with rest breaks. BUE therex for activity tolerance and sitting balance in upright position. 5# bar chest presses and 5# barbell bicep curls 3x12 with rest breaks in tilted position. 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 Pt reports MD has not given him any definitive answers (may get better or may get worse.) Pt is able to conduct some business over phone or computer but will still need the ability to have face to face meetings in client's homes. Will continue to discuss. Pt  engaged in ball taps with 5# bar 3x12 and chest presses with 2kg ball 3x12. Pt demo constant repositioning in upright position and when not engaged will doze. Pt returned to TIS position and returned to room. All needs within reach. SO present.   Therapy Documentation Precautions:  Precautions Precautions: Fall, Cervical Precaution Comments: hx of many falls, severe B LE hypertonia with muscle spasms, verbally went through cervical precautions throughout session Required Braces or Orthoses: Cervical Brace Cervical Brace: Hard collar, Other (comment) (OOB) Restrictions Weight Bearing Restrictions Per Provider Order: No Other Position/Activity Restrictions: cervical precautions    Pain:  Pt with periodic abdominal and BLE spasms with pt grimacing; repositioning and soft tissue massage/stertching  Therapy/Group: Individual Therapy  Maritza Debby Mare 06/28/2023, 10:31 AM

## 2023-06-28 NOTE — Progress Notes (Signed)
 Patient ID: Leon Rodriguez, male   DOB: Feb 04, 1966, 58 y.o.   MRN: [id number]  SW submitted referral to [facility] for Assistance with Disability.   [name], MSW, LCSW Office: [phone] Cell: [phone] Fax: [phone]

## 2023-06-29 DIAGNOSIS — G959 Disease of spinal cord, unspecified: Secondary | ICD-10-CM | POA: Diagnosis not present

## 2023-06-29 MED ORDER — QUETIAPINE FUMARATE 50 MG PO TABS
75.0000 mg | ORAL_TABLET | Freq: Every day | ORAL | Status: DC
  Administered 2023-06-29 – 2023-07-03 (×5): 75 mg via ORAL
  Filled 2023-06-29 (×5): qty 1

## 2023-06-29 NOTE — Progress Notes (Signed)
 PROGRESS NOTE   Subjective/Complaints:  No events overnight.  No complaints this a.m. Continues with significant spasticity bilateral lower extremities.  Discussed transitioning to Keppra  while decreasing Valium  versus awaiting primary team determination on this; patient wants his medications further adjusted by Dr. Lovvorn.  Wife is endorsing increased confusion overnight, with hallucinations, pulling at cervical collar.  Vitals stable Last BM 1/2; requiring ISC's for voiding with  volumes about 300-600  ROS:  + spasms - ongoing +neurogenic bladder - ongoing +confusion/sundowning- ongoing Pt denies SOB, abd pain, CP, N/V/C/D, and vision changes   Negative except for HPI  Objective:   No results found. Recent Labs    06/27/23 0557  WBC 7.4  HGB 11.2*  HCT 32.1*  PLT 427*    Recent Labs    06/27/23 0557  NA 139  K 3.8  CL 102  CO2 26  GLUCOSE 107*  BUN 11  CREATININE 0.70  CALCIUM 9.6     Intake/Output Summary (Last 24 hours) at 06/29/2023 0913 Last data filed at 06/29/2023 0545 Gross per 24 hour  Intake 240 ml  Output 1750 ml  Net -1510 ml        Physical Exam: Vital Signs Blood pressure 104/73, pulse 81, temperature (!) 97.5 F (36.4 C), resp. rate 18, height 5' 9 (1.753 m), weight 92.5 kg, SpO2 100%.        General: awake, alert, calm- NAD.  Lying in bed. HENT: conjugate gaze; oropharynx dry.  Cervical collar intact. CV: regular rate and rhythm; no JVD Pulmonary: CTA B/L; no W/R/R- good air movement GI: soft, NT, ND, (+)BS Psychiatric: Mildly flat, appropriate mood.  Ext: no clubbing, cyanosis, or edema Skin- healing blister on top of L foot-stable -does have area of significant bruising/DTI on right  Neurological: Awake, alert, oriented x 3.  Follows all simple commands. Tone really bad this AM- severe extensor tone- cannot break tone on RLE- so looks 4 but is extensor tone- and  LLE- MAS of 3 1/4: Seems somewhat improved on exam today, MAS 2-3 bilateral knees, MAS 2-3 bilateral plantar flexor, MAS 2 bilateral adductors  Strength: Moving bilateral upper extremities antigravity against resistance; bilateral lower extremities not quite antigravity  Prior:  Comments: Ue's 5-/5 throughout on exam B/L RLE- HF 2-/5; KE 2-/5; DF 4-/5; and PF 4-/5 LLE- HF 2-/5; KE 2-/5; DF 2/5; PF 2/5- spasticity still present Sensory level at T4 and below-      Assessment/Plan: 1. Functional deficits which require 3+ hours per day of interdisciplinary therapy in a comprehensive inpatient rehab setting. Physiatrist is providing close team supervision and 24 hour management of active medical problems listed below. Physiatrist and rehab team continue to assess barriers to discharge/monitor patient progress toward functional and medical goals  Care Tool:  Bathing    Body parts bathed by patient: Face   Body parts bathed by helper: Right arm, Left arm, Chest, Abdomen, Front perineal area, Buttocks, Right upper leg, Left upper leg, Right lower leg, Left lower leg (wife assisted pt with bath prior to OT evaluation)     Bathing assist Assist Level: Total Assistance - Patient < 25%     Upper Body Dressing/Undressing  Upper body dressing   What is the patient wearing?: Pull over shirt    Upper body assist Assist Level: Minimal Assistance - Patient > 75%    Lower Body Dressing/Undressing Lower body dressing      What is the patient wearing?: Pants, Incontinence brief     Lower body assist Assist for lower body dressing: Total Assistance - Patient < 25%     Toileting Toileting    Toileting assist Assist for toileting: 2 Helpers     Transfers Chair/bed transfer  Transfers assist     Chair/bed transfer assist level: 2 Helpers     Locomotion Ambulation   Ambulation assist   Ambulation activity did not occur: Safety/medical concerns (limited by extreme tone)           Walk 10 feet activity   Assist  Walk 10 feet activity did not occur: Safety/medical concerns        Walk 50 feet activity   Assist Walk 50 feet with 2 turns activity did not occur: Safety/medical concerns         Walk 150 feet activity   Assist Walk 150 feet activity did not occur: Safety/medical concerns         Walk 10 feet on uneven surface  activity   Assist Walk 10 feet on uneven surfaces activity did not occur: Safety/medical concerns         Wheelchair     Assist Is the patient using a wheelchair?: Yes Type of Wheelchair: Manual Wheelchair activity did not occur: Safety/medical concerns  Wheelchair assist level: Dependent - Patient 0% Max wheelchair distance: 150    Wheelchair 50 feet with 2 turns activity    Assist    Wheelchair 50 feet with 2 turns activity did not occur: Safety/medical concerns   Assist Level: Dependent - Patient 0%   Wheelchair 150 feet activity     Assist  Wheelchair 150 feet activity did not occur: Safety/medical concerns   Assist Level: Dependent - Patient 0%   Blood pressure 104/73, pulse 81, temperature (!) 97.5 F (36.4 C), resp. rate 18, height 5' 9 (1.753 m), weight 92.5 kg, SpO2 100%.  Medical Problem List and Plan: 1. Functional deficits secondary to Cervical myelopathy s/p ACDF- back after sepsis             -patient may  shower             -ELOS/Goals: 3-4 weeks- min A hopefully- likely mod to max A Grounds pass ordered  D/c 07/17/23 -have counseled pt/wife about why CT myelo wasn't performed. Discussed spasticity.  D/w pt and fiance that [facility] doesn't think he could get ITB pump due to lack of flow of CSF at distal end of Spinal cord- where he needs it most/due to more spasticity in legs than arms Con't CIR PT and OT Neuropsych to see today  2.  Antithrombotics: -DVT/anticoagulation:  Pharmaceutical: Lovenox              -antiplatelet therapy: N/A 3. Pain Management/spasticity:  MS  contin 15 mg bid, Oxycodone  prn. Lidoderm  patch for local measures. Tylenol  650 mg qid.             --bilateral hip pain with attempts at rolling.              --on Baclofen  20 mg qid, Valium  5 mg qid with Zanaflex  4 mg qid prn for spasticity.  -12/27 Started Duloxetine  20 mg at bedtime 12/25- titrate up every 3-4 days as  tolerated  -bumped baclofen  to 25mg  qid, consider scheduling tizanidine   -added gabapentin  100mg  tid for injury level/chest wall pain/spasticity 12/29- pushing antispasmodics given the severity of his spasticity. Continue to titrate neuro-sedation permitting -increased baclofen  to 30mg  qid 12/28 -continue valium , consider scheduled tizanidine  also -increased duloxetine  (now in am) to 30mg  -pt refuses to take gabapentin  as he doesn't want more potentially sedating medication--I stopped it yesterday 12/30- will add Dantrolene  50 mg at bedtime- explained it's gradual onset- will increase q5 days 12/31- will stop Dantrolene - so sleepy this AM- calling Dr Carollee to see what he thinks about placing ITB pump sooner than later? 1/1- educated fiance and pt that cannot do ITB pump due to lack of flow of CSF- p[er [facility]- reason couldn't get LP for CT myelogram Will wait for now on Keppra , but if sedation gets better, can try 1/2- sinc ehaving confusion, will stop Zanaflex - in rare cases, can cause hallucinations (I've sene in other patients) and will give more baclofen  prn 10 mg TID prn 1/3- tone if anything looking worse- don't know what else can do right now except Keppra , but will need to reduce Valium , or something else to try the Keppra -  1/4: Keppra  not started per primary team; discussed with patient, wish to wait until primary team is back on Monday to make major medication adjustments  4. Mood/Behavior/Sleep: LCSW to follow for evaluation and support.              -antipsychotic agents: N/A  -12/27--trazodone  and melatonin helpful 12/28 doing better with earlier scheduling of  trazodone  and melatonin 1/2- having some confusion at night- not sure if due to Zanaflex , which I sotpped, but will give Seroquel  25 mg at bedtime to help as well -d/c trazodone  1/3- will increase Seorquel to 50 mg at bedtime- took clothes off last night- got confused, but calmer, per fiance'.  1/4: Increase seroquel  to 75 mg nightly, and retimed to 8 PM  5. Neuropsych/cognition: This patient is? capable of making decisions on his own behalf. 6. Skin/Wound Care: Routine pressure relief measures.   -has developed left heel sore d/t tone, struggles to fit in [facility]  -heel dressed, ordered prevalon boots so that he's better able to tolerate wear-- -12/29 prevalon boots in place this am--tolerating -1/4: right heel DTI on exam; inquiring with nursing  7. Hyponatremia: continue to monitor Na weekly  12/30- Na 139 8. Klebsiella aerogenes bacteremia/sepsis /UTI: levaquin   -abx completed per ID recs--7 days 9. Neurogenic bowel: Continue Senna S 2 tabs BID. Miralax  daily-->has been incontinent of bowel and question due to antibiotics and/or laxatives --Suppository daily after supper-->has refused for past 3 nights.  --would prefer enema three times a week as at home. Have suggested using mini enema -12/29 had large bm in early morning 12/28  -refusing docusate enema some nights including last night 12/30- now full of stool- since refusing enema- and will give Sorbitol  and enema today, but pt needs to understand cannot refuse regularly 12/31- had extra large BM last night- willing to do enema and sorbitol  again today to empty out. 1/1- Pt and fiance now understand why need to do nightly- eventually they want bowel program in AM- 1/2- Didn't have dig stim written for- will add to notes- no results last night- abd firm- will give sorbitol - will order Fleet's enema daily prn for severe constipation.    1/3- LBM medium yesterday with bowel program  1/4: no bm with bowel program yesterday   10.  Neurogenic bladder:  Foley was replaced on acute-->will d/c and start bladder program. (patient would like to resume) --Monitor voiding with PVR checks             --use coude cath and monitor for recurrent hematuria             --continue flomax \  12/29 cath volumes generally in range until this morning--continue current schedule  12/31- Cath volumes looking good- cathing q4 hours  1/1- per fiance' Draining slow/caths- will speak to nursing- on max dose of Flomax  to try and get pt to void.   1/2- Pt's cathing is doing well- went 1 time 5 hours 45 minutes last night- but fiance concerned they are waiting for her to do- will need help with this.   1/3- cath volumes going well --continue cathing  11. Neuropathy/at level SCI pain: Was on Cymbalta  PTA-resumed at 20 mg at bedtime, continue--increased to 30mg  daily effective 12/28  12/30- doesn't want Cymbalta  for pain, but willing to take for mood   LOS: 11 days A FACE TO FACE EVALUATION WAS PERFORMED  Leon Rodriguez Likes 06/29/2023, 9:13 AM

## 2023-06-30 DIAGNOSIS — F4321 Adjustment disorder with depressed mood: Secondary | ICD-10-CM

## 2023-06-30 DIAGNOSIS — G959 Disease of spinal cord, unspecified: Secondary | ICD-10-CM | POA: Diagnosis not present

## 2023-06-30 NOTE — Progress Notes (Signed)
 Physical Therapy Session Note  Patient Details  Name: Leon Rodriguez MRN: [id number] Date of Birth: [birth year]  Today's Date: 06/30/2023 PT Individual Time: 1116-1201 PT Individual Time Calculation (min): 45 min   Short Term Goals: Week 2:  PT Short Term Goal 1 (Week 2): pt will perform SBT with mod a consistently PT Short Term Goal 2 (Week 2): Pt will tolerate standing with assist or standing frame x 3 min PT Short Term Goal 3 (Week 2): Pt will verbalize pressure relief frequency and duration  Skilled Therapeutic Interventions/Progress Updates:      Pt seated in TIS WC upon arrival. Pt reports 10/10 pain R LE 2/2 muscle spasms, pt reports constant ache in L elbow, premedicated, therpaist provided heat pack to R LE and performed PROM (knee flexion and extension) to R LE for pain/spasm management and activity tolerance.   Pt performed WC push up x4 to donn pants with total A, verbal cues provided for technique and use of B LE.   Donned shoes with total A, initially trialed regular tennis shoes per pt request however unable 2/2 swelling, however able to get on alternative pair (previously used during sessions).   Pt attempted sit to stand x4 in // bars with 10# ankle weight  (versus standing frame 2/2 time deficits), pt unable to stand fully erect 2/2 increased tone, with therapist positioning leg between pts legs to reduce hip adduction tone.  Pt unable to recall pressure relief frequency and duration. Education provided to perform pressure relief in [facility] every 15 minutes for 2 minutes, and every 2 hours in bed. Pt and wife verbalized understanding. Education provided for [facility] push up as well as changing tilt of WC.   Pt seated upright in TIS WC at end of session with all needs within reach and wife in room.   Therapy Documentation Precautions:  Precautions Precautions: Fall, Cervical Precaution Comments: hx of many falls, severe B LE hypertonia with muscle spasms, verbally went through  cervical precautions throughout session Required Braces or Orthoses: Cervical Brace Cervical Brace: Hard collar, Other (comment) (OOB) Restrictions Weight Bearing Restrictions Per Provider Order: No Other Position/Activity Restrictions: cervical precautions  Therapy/Group: Individual Therapy  [facility] [name], [place], DPT  06/30/2023, 7:58 AM

## 2023-06-30 NOTE — Progress Notes (Signed)
 IP Rehab Bowel Program Documentation   Bowel Program Start time [phone]  Dig Stim Indicated? Yes  Dig Stim Prior to Suppository or mini Enema X 1   Output from dig stim: Small  Ordered intervention: Suppository No , mini enema Yes ,   Repeat dig stim after Suppository or Mini enema  X 1,  Output? Minimal   Bowel Program Complete? Yes   Patient Tolerated? Yes

## 2023-06-30 NOTE — Progress Notes (Signed)
 PROGRESS NOTE   Subjective/Complaints:  Overnight, patient endorses vivid dream involving a friend that was screaming for help and he was unable to find.  He notes both in the dream and out of the dream, he had an episode of severe full body spasms.  He additionally endorses seeing colors yellow and green when his eyes are closed.  He denies any gross hallucinations, delusions.  Family at bedside, agree with patient's assessment.  Patient endorses that his spasticity seems to be getting worse with current treatments.  He states that, yesterday, he was thinking that he would rather be dead.  He states that this thought was transient, that this morning he is hopeful and wanting to live, no active SI or HI, no plan.  Vitals stable Small bowel movement with bowel program yesterday.  Continues In-N-Out caths with volumes approximately 300-700.  ROS:  + spasms - ongoing +neurogenic bladder - ongoing +confusion/sundowning- ongoing Pt denies SOB, abd pain, CP, N/V/C/D, and vision changes   Negative except for HPI  Objective:   No results found. No results for input(s): WBC, HGB, HCT, PLT in the last 72 hours.   No results for input(s): NA, K, CL, CO2, GLUCOSE, BUN, CREATININE, CALCIUM in the last 72 hours.    Intake/Output Summary (Last 24 hours) at 06/30/2023 2104 Last data filed at 06/30/2023 1600 Gross per 24 hour  Intake 480 ml  Output 1725 ml  Net -1245 ml        Physical Exam: Vital Signs Blood pressure 119/76, pulse (!) 105, temperature 98.2 F (36.8 C), temperature source Oral, resp. rate 14, height 5' 9 (1.753 m), weight 92 kg, SpO2 100%.   General: awake, alert, calm- NAD.  Sitting upright in wheelchair. HENT: conjugate gaze; oropharynx dry.  Cervical collar intact.  No TTP through neck. CV: regular rate and rhythm; no JVD Pulmonary: CTA B/L; no W/R/R- good air movement GI: soft, NT, ND,  (+)BS Psychiatric: Mildly flat, appropriate mood.  Ext: no clubbing, cyanosis, or edema Skin- healing blister on top of L foot-stable -Area of discoloration darkened skin on right heel, does not appear to be DTI  Neurological: Awake, alert, oriented x 3.  Follows all simple commands.  1-5: Left lower extremity MAS 2 knee extensors, MAS 1 plantar flexors, MAS 2 adductors Right lower extremity MAS 3-4 knee extensors, MAS 3 plantarflexors, MAS 3 adductors  Strength: Moving bilateral upper extremities antigravity against resistance; bilateral lower extremities not quite antigravity in hip flexion Sensation: Altered below T4  Prior:  Comments: Ue's 5-/5 throughout on exam B/L RLE- HF 2-/5; KE 2-/5; DF 4-/5; and PF 4-/5 LLE- HF 2-/5; KE 2-/5; DF 2/5; PF 2/5- spasticity still present Sensory level at T4 and below-      Assessment/Plan: 1. Functional deficits which require 3+ hours per day of interdisciplinary therapy in a comprehensive inpatient rehab setting. Physiatrist is providing close team supervision and 24 hour management of active medical problems listed below. Physiatrist and rehab team continue to assess barriers to discharge/monitor patient progress toward functional and medical goals  Care Tool:  Bathing    Body parts bathed by patient: Face   Body parts bathed by helper:  Right arm, Left arm, Chest, Abdomen, Front perineal area, Buttocks, Right upper leg, Left upper leg, Right lower leg, Left lower leg (wife assisted pt with bath prior to OT evaluation)     Bathing assist Assist Level: Total Assistance - Patient < 25%     Upper Body Dressing/Undressing Upper body dressing   What is the patient wearing?: Pull over shirt    Upper body assist Assist Level: Minimal Assistance - Patient > 75%    Lower Body Dressing/Undressing Lower body dressing      What is the patient wearing?: Pants, Incontinence brief     Lower body assist Assist for lower body dressing:  Total Assistance - Patient < 25%     Toileting Toileting    Toileting assist Assist for toileting: 2 Helpers     Transfers Chair/bed transfer  Transfers assist     Chair/bed transfer assist level: 2 Helpers     Locomotion Ambulation   Ambulation assist   Ambulation activity did not occur: Safety/medical concerns (limited by extreme tone)          Walk 10 feet activity   Assist  Walk 10 feet activity did not occur: Safety/medical concerns        Walk 50 feet activity   Assist Walk 50 feet with 2 turns activity did not occur: Safety/medical concerns         Walk 150 feet activity   Assist Walk 150 feet activity did not occur: Safety/medical concerns         Walk 10 feet on uneven surface  activity   Assist Walk 10 feet on uneven surfaces activity did not occur: Safety/medical concerns         Wheelchair     Assist Is the patient using a wheelchair?: Yes Type of Wheelchair: Manual Wheelchair activity did not occur: Safety/medical concerns  Wheelchair assist level: Dependent - Patient 0% Max wheelchair distance: 150    Wheelchair 50 feet with 2 turns activity    Assist    Wheelchair 50 feet with 2 turns activity did not occur: Safety/medical concerns   Assist Level: Dependent - Patient 0%   Wheelchair 150 feet activity     Assist  Wheelchair 150 feet activity did not occur: Safety/medical concerns   Assist Level: Dependent - Patient 0%   Blood pressure 119/76, pulse (!) 105, temperature 98.2 F (36.8 C), temperature source Oral, resp. rate 14, height 5' 9 (1.753 m), weight 92 kg, SpO2 100%.  Medical Problem List and Plan: 1. Functional deficits secondary to Cervical myelopathy s/p ACDF- back after sepsis             -patient may  shower             -ELOS/Goals: 3-4 weeks- min A hopefully- likely mod to max A Grounds pass ordered  D/c 07/17/23 -have counseled pt/wife about why CT myelo wasn't performed. Discussed  spasticity.  D/w pt and fiance that [facility] doesn't think he could get ITB pump due to lack of flow of CSF at distal end of Spinal cord- where he needs it most/due to more spasticity in legs than arms Con't CIR PT and OT Neuropsych to see today  2.  Antithrombotics: -DVT/anticoagulation:  Pharmaceutical: Lovenox              -antiplatelet therapy: N/A 3. Pain Management/spasticity:  MS contin  15 mg bid, Oxycodone  prn. Lidoderm  patch for local measures. Tylenol  650 mg qid.             --  bilateral hip pain with attempts at rolling.              --on Baclofen  20 mg qid, Valium  5 mg qid with Zanaflex  4 mg qid prn for spasticity.  -12/27 Started Duloxetine  20 mg at bedtime 12/25- titrate up every 3-4 days as tolerated  -bumped baclofen  to 25mg  qid, consider scheduling tizanidine   -added gabapentin  100mg  tid for injury level/chest wall pain/spasticity 12/29- pushing antispasmodics given the severity of his spasticity. Continue to titrate neuro-sedation permitting -increased baclofen  to 30mg  qid 12/28 -continue valium , consider scheduled tizanidine  also -increased duloxetine  (now in am) to 30mg  -pt refuses to take gabapentin  as he doesn't want more potentially sedating medication--I stopped it yesterday 12/30- will add Dantrolene  50 mg at bedtime- explained it's gradual onset- will increase q5 days 12/31- will stop Dantrolene - so sleepy this AM- calling Dr Carollee to see what he thinks about placing ITB pump sooner than later? 1/1- educated fiance and pt that cannot do ITB pump due to lack of flow of CSF- p[er [facility]- reason couldn't get LP for CT myelogram Will wait for now on Keppra , but if sedation gets better, can try 1/2- sinc ehaving confusion, will stop Zanaflex - in rare cases, can cause hallucinations (I've sene in other patients) and will give more baclofen  prn 10 mg TID prn 1/3- tone if anything looking worse- don't know what else can do right now except Keppra , but will need to reduce Valium , or  something else to try the Keppra  1/4:  discussed with patient, wish to wait until primary team is back on Monday to make major medication adjustments/add Keppra - 1/5: tone remains severe, again patient wishes major medication adjustments per primary team, but could likely retry Zanaflex  given confusion has not improved  4. Mood/Behavior/Sleep: LCSW to follow for evaluation and support.              -antipsychotic agents: N/A  -12/27--trazodone  and melatonin helpful 12/28 doing better with earlier scheduling of trazodone  and melatonin 1/2- having some confusion at night- not sure if due to Zanaflex , which I sotpped, but will give Seroquel  25 mg at bedtime to help as well -d/c trazodone  1/3- will increase Seorquel to 50 mg at bedtime- took clothes off last night- got confused, but calmer, per fiance'.  1/4: Increase seroquel  to 75 mg nightly, and retimed to 8 PM 1-5: DC melatonin due to vivid dreams.  No active hallucinations last night, so continue current dose of Seroquel ..  Patient endorsed passive SI yesterday, resolved today, he is agreeable to immediately notifying physician and staff if this recurs--psychiatry not consulted due to patient being no active risk of self-harm and with recent start of duloxetine   5. Neuropsych/cognition: This patient is? capable of making decisions on his own behalf. 6. Skin/Wound Care: Routine pressure relief measures.   -has developed left heel sore d/t tone, struggles to fit in [facility]  -heel dressed, ordered prevalon boots so that he's better able to tolerate wear-- -12/29 prevalon boots in place this am--tolerating -1/4: right heel ?DTI on exam--more consistent with skin color variation.  Other wounds appear improving/stable.  7. Hyponatremia: continue to monitor Na weekly  12/30- Na 139  8. Klebsiella aerogenes bacteremia/sepsis /UTI: levaquin   -abx completed per ID recs--7 days  9. Neurogenic bowel: Continue Senna S 2 tabs BID. Miralax  daily-->has been  incontinent of bowel and question due to antibiotics and/or laxatives --Suppository daily after supper-->has refused for past 3 nights.  --would prefer enema three times a week as  at home. Have suggested using mini enema -12/29 had large bm in early morning 12/28  -refusing docusate enema some nights including last night 12/30- now full of stool- since refusing enema- and will give Sorbitol  and enema today, but pt needs to understand cannot refuse regularly 12/31- had extra large BM last night- willing to do enema and sorbitol  again today to empty out. 1/1- Pt and fiance now understand why need to do nightly- eventually they want bowel program in AM- 1/2- Didn't have dig stim written for- will add to notes- no results last night- abd firm- will give sorbitol - will order Fleet's enema daily prn for severe constipation.    1/3- LBM medium yesterday with bowel program  1/4: BM small with bowel program   10. Neurogenic bladder: Foley was replaced on acute-->will d/c and start bladder program. (patient would like to resume) --Monitor voiding with PVR checks             --use coude cath and monitor for recurrent hematuria             --continue flomax \  12/29 cath volumes generally in range until this morning--continue current schedule  12/31- Cath volumes looking good- cathing q4 hours  1/1- per fiance' Draining slow/caths- will speak to nursing- on max dose of Flomax  to try and get pt to void.   1/2- Pt's cathing is doing well- went 1 time 5 hours 45 minutes last night- but fiance concerned they are waiting for her to do- will need help with this.   1/3-5 cath volumes going well --continue cathing  11. 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

## 2023-07-01 DIAGNOSIS — G959 Disease of spinal cord, unspecified: Secondary | ICD-10-CM | POA: Diagnosis not present

## 2023-07-01 LAB — CBC
HCT: 33.5 % — ABNORMAL LOW (ref 39.0–52.0)
Hemoglobin: 11.5 g/dL — ABNORMAL LOW (ref 13.0–17.0)
MCH: 33.5 pg (ref 26.0–34.0)
MCHC: 34.3 g/dL (ref 30.0–36.0)
MCV: 97.7 fL (ref 80.0–100.0)
Platelets: 338 10*3/uL (ref 150–400)
RBC: 3.43 MIL/uL — ABNORMAL LOW (ref 4.22–5.81)
RDW: 13.2 % (ref 11.5–15.5)
WBC: 6.7 10*3/uL (ref 4.0–10.5)
nRBC: 0 % (ref 0.0–0.2)

## 2023-07-01 LAB — BASIC METABOLIC PANEL
Anion gap: 10 (ref 5–15)
BUN: 8 mg/dL (ref 6–20)
CO2: 26 mmol/L (ref 22–32)
Calcium: 9.7 mg/dL (ref 8.9–10.3)
Chloride: 102 mmol/L (ref 98–111)
Creatinine, Ser: 0.66 mg/dL (ref 0.61–1.24)
GFR, Estimated: >60 mL/min (ref >60)
Glucose, Bld: 93 mg/dL (ref 70–99)
Potassium: 4 mmol/L (ref 3.5–5.1)
Sodium: 138 mmol/L (ref 135–145)

## 2023-07-01 MED ORDER — MORPHINE SULFATE ER 15 MG PO TBCR
15.0000 mg | EXTENDED_RELEASE_TABLET | Freq: Every day | ORAL | Status: DC
Start: 2023-07-02 — End: 2023-07-12
  Administered 2023-07-02 – 2023-07-11 (×10): 15 mg via ORAL
  Filled 2023-07-01 (×11): qty 1

## 2023-07-01 MED ORDER — SIMETHICONE 80 MG PO CHEW
160.0000 mg | CHEWABLE_TABLET | Freq: Four times a day (QID) | ORAL | Status: DC
  Administered 2023-07-01 – 2023-07-12 (×42): 160 mg via ORAL
  Filled 2023-07-01 (×42): qty 2

## 2023-07-01 MED ORDER — LEVETIRACETAM 250 MG PO TABS
250.0000 mg | ORAL_TABLET | Freq: Two times a day (BID) | ORAL | Status: DC
  Administered 2023-07-01 – 2023-07-05 (×9): 250 mg via ORAL
  Filled 2023-07-01 (×9): qty 1

## 2023-07-01 MED ORDER — DULOXETINE HCL 60 MG PO CPEP
60.0000 mg | ORAL_CAPSULE | Freq: Every day | ORAL | Status: DC
  Administered 2023-07-01 – 2023-07-12 (×12): 60 mg via ORAL
  Filled 2023-07-01 (×12): qty 1

## 2023-07-01 NOTE — Progress Notes (Signed)
 Occupational Therapy Session Note  Patient Details  Name: Leon Rodriguez MRN: [id number] Date of Birth: 09-Aug-1965  Today's Date: 07/01/2023 OT Individual Time: 0900-1000 OT Individual Time Calculation (min): 60 min    Short Term Goals: Week 2:  OT Short Term Goal 1 (Week 2): Pt will be able to tolerate sitting EOB with min A and no back support to wash UB with min A. OT Short Term Goal 1 - Progress (Week 2): Met OT Short Term Goal 2 (Week 2): Pt will be able to don a shirt with min A sitting EOB with no back support. OT Short Term Goal 2 - Progress (Week 2): Progressing toward goal OT Short Term Goal 3 (Week 2): Pt will be able wash LE from bed level with mod A. OT Short Term Goal 3 - Progress (Week 2): Progressing toward goal OT Short Term Goal 4 (Week 2): pt will be able to rise to stand in a standard stedy lift with max A of 2. OT Short Term Goal 4 - Progress (Week 2): Progressing toward goal  Skilled Therapeutic Interventions/Progress Updates:    Pt in bed upon arrival with SO present. Pt reports his spasms and pain were terrible throughout night. Pt with ongiong BLE spasms at bed level but after rest requested to transfer to w/c. BLE stretching and gentle massage provided with limited relief. Dependent for donning shorts prior to sitting EOB. Supine>sit EOB with mod A. Sitting balance EOB with CGA. TB transfer to w/c with max A with +2 for safety. Pt able to assist with repositioning in w/c. Pt reports pain level about same when sitting in w/c. Pt remained in w/c with all needs within reach. SO present. LPN notfied of pt status.  Therapy Documentation Precautions:  Precautions Precautions: Fall, Cervical Precaution Comments: hx of many falls, severe B LE hypertonia with muscle spasms, verbally went through cervical precautions throughout session Required Braces or Orthoses: Cervical Brace Cervical Brace: Hard collar, Other (comment) (OOB) Restrictions Weight Bearing  Restrictions Per Provider Order: No Other Position/Activity Restrictions: cervical precautions   Pain:  Pt with increased pain/spasms throghout session (unrated) but pt grimacing indicating worst pain. LPN notified but not scheduled for meds until 1000. Repositioned with limited relief noted.  Therapy/Group: Individual Therapy  Maritza Debby Mare 07/01/2023, 11:40 AM

## 2023-07-01 NOTE — Progress Notes (Signed)
 Pt complaining of pain from spasms. According to spouse, pt loss consciousness; however upon entering the room, this nurse observed the patient was awake and in pain. Blood pressure was taken 122/87, Pulse 90, SPO2 95%. Pt was then given Baclofen  30mg  and Valium  5mg . Pt was also returned to the bed and cath'd. Patient unavailable for cath earlier due to being off the unit with family member and therapy sessions. Pt reports relief with medications. Pam PA notified by Bevely D.P.T. Care ongoing.    Rhilee Currin  VEAR Miyamoto, LPN

## 2023-07-01 NOTE — Progress Notes (Signed)
 PROGRESS NOTE   Subjective/Complaints:  Pt reports dream he had yesterday where friend screaming still bothering him- felt so real.  Per fiance' spasms kept him up last night, but getting rid of night medicine stopped him last night from getting undressed/taking bed apart.  Feeling taut band around wait- feels like gas too much gas, but describes as at level SCI pain.  Elbows aching and spasms getting worse.  We discussed how need to decrease MS Contin   so less sedated, so can add keppra  for nerve pain and spasticity.  Pt willing, but scared about side effects- isn't taking Oxy very often, so explained won't change Oxy so can still take as needed.    Said also having abd cramps so bad, screaming- Gas?  Also feels disconnected from feet to body. And feels like feet crossed  Still very depressed- cannot live like this.     ROS:  + spasms - ongoing- bothersome +neurogenic bladder - ongoing +confusion/sundowning- a little better   Pt denies SOB, abd pain, CP, N/V/C/D, and vision changes    Negative except for HPI  Objective:   No results found. Recent Labs    07/01/23 0525  WBC 6.7  HGB 11.5*  HCT 33.5*  PLT 338     Recent Labs    07/01/23 0525  NA 138  K 4.0  CL 102  CO2 26  GLUCOSE 93  BUN 8  CREATININE 0.66  CALCIUM 9.7      Intake/Output Summary (Last 24 hours) at 07/01/2023 9177 Last data filed at 07/01/2023 0700 Gross per 24 hour  Intake 717 ml  Output 1625 ml  Net -908 ml        Physical Exam: Vital Signs Blood pressure 111/72, pulse 89, temperature 97.9 F (36.6 C), temperature source Oral, resp. rate 18, height 5' 9 (1.753 m), weight 92 kg, SpO2 99%.    General: awake, alert, appropriate, sitting up a little in bed; fiance' at bedside; NAD HENT: conjugate gaze; oropharynx moist CV: regular rate and rhythm; no JVD Pulmonary: CTA B/L; no W/R/R- good air movement GI: soft, NT,  ND, (+)BS- pt c/o tightness- but not firm; not distended- normoactive BS Psychiatric: appropriate- but very depressed affect Neurological: Ox3 MAS worse in LE's- significant extensor tone- hard to relax to check tone.  Ext: no clubbing, cyanosis, or edema Skin- healing blister on top of L foot-stable -Area of discoloration darkened skin on right heel, does not appear to be DTI  Neurological: Awake, alert, oriented x 3.  Follows all simple commands.  1-5: Left lower extremity MAS 2 knee extensors, MAS 1 plantar flexors, MAS 2 adductors Right lower extremity MAS 3-4 knee extensors, MAS 3 plantarflexors, MAS 3 adductors  Strength: Moving bilateral upper extremities antigravity against resistance; bilateral lower extremities not quite antigravity in hip flexion Sensation: Altered below T4  Prior:  Comments: Ue's 5-/5 throughout on exam B/L RLE- HF 2-/5; KE 2-/5; DF 4-/5; and PF 4-/5 LLE- HF 2-/5; KE 2-/5; DF 2/5; PF 2/5- spasticity still present Sensory level at T4 and below-      Assessment/Plan: 1. Functional deficits which require 3+ hours per day of interdisciplinary therapy in a  comprehensive inpatient rehab setting. Physiatrist is providing close team supervision and 24 hour management of active medical problems listed below. Physiatrist and rehab team continue to assess barriers to discharge/monitor patient progress toward functional and medical goals  Care Tool:  Bathing    Body parts bathed by patient: Face   Body parts bathed by helper: Right arm, Left arm, Chest, Abdomen, Front perineal area, Buttocks, Right upper leg, Left upper leg, Right lower leg, Left lower leg (wife assisted pt with bath prior to OT evaluation)     Bathing assist Assist Level: Total Assistance - Patient < 25%     Upper Body Dressing/Undressing Upper body dressing   What is the patient wearing?: Pull over shirt    Upper body assist Assist Level: Minimal Assistance - Patient > 75%    Lower  Body Dressing/Undressing Lower body dressing      What is the patient wearing?: Pants, Incontinence brief     Lower body assist Assist for lower body dressing: Total Assistance - Patient < 25%     Toileting Toileting    Toileting assist Assist for toileting: 2 Helpers     Transfers Chair/bed transfer  Transfers assist     Chair/bed transfer assist level: 2 Helpers     Locomotion Ambulation   Ambulation assist   Ambulation activity did not occur: Safety/medical concerns (limited by extreme tone)          Walk 10 feet activity   Assist  Walk 10 feet activity did not occur: Safety/medical concerns        Walk 50 feet activity   Assist Walk 50 feet with 2 turns activity did not occur: Safety/medical concerns         Walk 150 feet activity   Assist Walk 150 feet activity did not occur: Safety/medical concerns         Walk 10 feet on uneven surface  activity   Assist Walk 10 feet on uneven surfaces activity did not occur: Safety/medical concerns         Wheelchair     Assist Is the patient using a wheelchair?: Yes Type of Wheelchair: Manual Wheelchair activity did not occur: Safety/medical concerns  Wheelchair assist level: Dependent - Patient 0% Max wheelchair distance: 150    Wheelchair 50 feet with 2 turns activity    Assist    Wheelchair 50 feet with 2 turns activity did not occur: Safety/medical concerns   Assist Level: Dependent - Patient 0%   Wheelchair 150 feet activity     Assist  Wheelchair 150 feet activity did not occur: Safety/medical concerns   Assist Level: Dependent - Patient 0%   Blood pressure 111/72, pulse 89, temperature 97.9 F (36.6 C), temperature source Oral, resp. rate 18, height 5' 9 (1.753 m), weight 92 kg, SpO2 99%.  Medical Problem List and Plan: 1. Functional deficits secondary to Cervical myelopathy s/p ACDF- back after sepsis             -patient may  shower              -ELOS/Goals: 3-4 weeks- min A hopefully- likely mod to max A Grounds pass ordered  D/c 07/17/23 -have counseled pt/wife about why CT myelo wasn't performed. Discussed spasticity.  D/w pt and fiance that [facility] doesn't think he could get ITB pump due to lack of flow of CSF at distal end of Spinal cord- where he needs it most/due to more spasticity in legs than arms Con't CIR PT and  OT Reinforced he is NOT a candidate for ITB pump 2.  Antithrombotics: -DVT/anticoagulation:  Pharmaceutical: Lovenox              -antiplatelet therapy: N/A 3. Pain Management/spasticity:  MS contin  15 mg bid, Oxycodone  prn. Lidoderm  patch for local measures. Tylenol  650 mg qid.             --bilateral hip pain with attempts at rolling.              --on Baclofen  20 mg qid, Valium  5 mg qid with Zanaflex  4 mg qid prn for spasticity.  -12/27 Started Duloxetine  20 mg at bedtime 12/25- titrate up every 3-4 days as tolerated  -bumped baclofen  to 25mg  qid, consider scheduling tizanidine   -added gabapentin  100mg  tid for injury level/chest wall pain/spasticity 12/29- pushing antispasmodics given the severity of his spasticity. Continue to titrate neuro-sedation permitting -increased baclofen  to 30mg  qid 12/28 -continue valium , consider scheduled tizanidine  also -increased duloxetine  (now in am) to 30mg  -pt refuses to take gabapentin  as he doesn't want more potentially sedating medication--I stopped it yesterday 12/30- will add Dantrolene  50 mg at bedtime- explained it's gradual onset- will increase q5 days 12/31- will stop Dantrolene - so sleepy this AM- calling Dr Carollee to see what he thinks about placing ITB pump sooner than later? 1/1- educated fiance and pt that cannot do ITB pump due to lack of flow of CSF- p[er [facility]- reason couldn't get LP for CT myelogram Will wait for now on Keppra , but if sedation gets better, can try 1/2- sinc ehaving confusion, will stop Zanaflex - in rare cases, can cause hallucinations (I've sene in  other patients) and will give more baclofen  prn 10 mg TID prn 1/3- tone if anything looking worse- don't know what else can do right now except Keppra , but will need to reduce Valium , or something else to try the Keppra  1/4:  discussed with patient, wish to wait until primary team is back on Monday to make major medication adjustments/add Keppra - 1/5: tone remains severe, again patient wishes major medication adjustments per primary team, but could likely retry Zanaflex  given confusion has not improved 1/6- will add keppra  250 mg BID and reduce MS Contin  to 1x/day in evening. To reduce sedation during day.  4. Mood/Behavior/Sleep: LCSW to follow for evaluation and support.              -antipsychotic agents: N/A  -12/27--trazodone  and melatonin helpful 12/28 doing better with earlier scheduling of trazodone  and melatonin 1/2- having some confusion at night- not sure if due to Zanaflex , which I sotpped, but will give Seroquel  25 mg at bedtime to help as well -d/c trazodone  1/3- will increase Seorquel to 50 mg at bedtime- took clothes off last night- got confused, but calmer, per fiance'.  1/4: Increase seroquel  to 75 mg nightly, and retimed to 8 PM 1-5: DC melatonin due to vivid dreams.  No active hallucinations last night, so continue current dose of Seroquel ..  Patient endorsed passive SI yesterday, resolved today, he is agreeable to immediately notifying physician and staff if this recurs--psychiatry not consulted due to patient being no active risk of self-harm and with recent start of duloxetine  1/6- will increase Duloxetine  to 60 mg at bedtime- since sedated during day, to reduce daytime sedation.  5. Neuropsych/cognition: This patient is? capable of making decisions on his own behalf. 6. Skin/Wound Care: Routine pressure relief measures.   -has developed left heel sore d/t tone, struggles to fit in PRAFO  -heel dressed, ordered  prevalon boots so that he's better able to tolerate wear-- -12/29  prevalon boots in place this am--tolerating -1/4: right heel ?DTI on exam--more consistent with skin color variation.  Other wounds appear improving/stable.  7. Hyponatremia: continue to monitor Na weekly  12/30- Na 139  8. Klebsiella aerogenes bacteremia/sepsis /UTI: levaquin   -abx completed per ID recs--7 days  9. Neurogenic bowel: Continue Senna S 2 tabs BID. Miralax  daily-->has been incontinent of bowel and question due to antibiotics and/or laxatives --Suppository daily after supper-->has refused for past 3 nights.  --would prefer enema three times a week as at home. Have suggested using mini enema -12/29 had large bm in early morning 12/28  -refusing docusate enema some nights including last night 12/30- now full of stool- since refusing enema- and will give Sorbitol  and enema today, but pt needs to understand cannot refuse regularly 12/31- had extra large BM last night- willing to do enema and sorbitol  again today to empty out. 1/1- Pt and fiance now understand why need to do nightly- eventually they want bowel program in AM- 1/2- Didn't have dig stim written for- will add to notes- no results last night- abd firm- will give sorbitol - will order Fleet's enema daily prn for severe constipation.    1/3- LBM medium yesterday with bowel program  1/4: BM small with bowel program 1/6- having BMs nightly with bowel program  10. Neurogenic bladder: Foley was replaced on acute-->will d/c and start bladder program. (patient would like to resume) --Monitor voiding with PVR checks             --use coude cath and monitor for recurrent hematuria             --continue flomax \  12/29 cath volumes generally in range until this morning--continue current schedule  12/31- Cath volumes looking good- cathing q4 hours  1/1- per fiance' Draining slow/caths- will speak to nursing- on max dose of Flomax  to try and get pt to void.   1/2- Pt's cathing is doing well- went 1 time 5 hours 45 minutes last  night- but fiance concerned they are waiting for her to do- will need help with this.   1/3-5 cath volumes going well --continue cathing  1/6- will change caths to q4 hours scheduled to reduce spasticity 11. Neuropathy/at level SCI pain: Was on Cymbalta  PTA-resumed at 20 mg at bedtime, continue--increased to 30mg  daily effective 12/28  12/30- doesn't want Cymbalta  for pain, but willing to take for mood  1-5: See #4 above  1/6- will increase Duloxetine  to 60 mg at bedtime for pain/mood.  12. Abd pain/gas/at level SCI pain  1/6- will add Gasx 160 mg QID and also Keppra  250 mg BID    I spent a total of 54   minutes on total care today- >50% coordination of care- due to  In room for 35 minutes- dicsusing issues as detailed above- also added Keppra  and increased Duloxetine - also decreased MS Contin - and added Gasx- max dose-     LOS: 13 days A FACE TO FACE EVALUATION WAS PERFORMED  Gabriana Wilmott 07/01/2023, 8:22 AM

## 2023-07-01 NOTE — Progress Notes (Signed)
 Physical Therapy Session Note  Patient Details  Name: Leon Rodriguez MRN: [id number] Date of Birth: Oct 18, 1965  Today's Date: 07/01/2023 PT Individual Time: 1415-1530 PT Individual Time Calculation (min): 75 min   Short Term Goals: Week 2:  PT Short Term Goal 1 (Week 2): pt will perform SBT with mod a consistently PT Short Term Goal 2 (Week 2): Pt will tolerate standing with assist or standing frame x 3 min PT Short Term Goal 3 (Week 2): Pt will verbalize pressure relief frequency and duration  Skilled Therapeutic Interventions/Progress Updates:    Pt recd in bed noted to be confused and not recognizing this therapist despite previously recognizing and recalling name. By end of session, pt with increased arousal and was able to recall therapist name/face. Pt with unrated pain related to spasms throughout session, premedicated. Rest and positioning provided as needed.   Supine>sit with max a for BLE and trunk management. Increased response time, 5-10 sec with low arousal. Pt was able to maintain sitting balance with intermittent cues and up to min a.   Pt found to be incontinent of bowel, returned to supine tot A. Pt's wife lead hygiene and brief change with therapist providing education on body mechanics and safety, as well as encouraging the pt to perform more parts of ADLs to increase independence and reduce care giver burden. Discussed raising bed height to reduce back pain position for best body mechanics. Pt and wife also performed upper body bathing and dressing in this manner. Assisted pt with putting on spray Deoderant, pt with inadequate strength to press stopper but able to press with assist.   Pt requested to get in TIS chair at end of session. This time supine>sit with heavy mod a and less assist for trunk management d/t increased arousal. Pt was able to perform with   Pt requested to return to chair, did so with max-mod a slideboard transfer with +2 standing by for safety. Pt  remained in chair with his wife present.    Therapy Documentation Precautions:  Precautions Precautions: Fall, Cervical Precaution Comments: hx of many falls, severe B LE hypertonia with muscle spasms, verbally went through cervical precautions throughout session Required Braces or Orthoses: Cervical Brace Cervical Brace: Hard collar, Other (comment) (OOB) Restrictions Weight Bearing Restrictions Per Provider Order: No Other Position/Activity Restrictions: cervical precautions General: PT Amount of Missed Time (min): 45 Minutes PT Missed Treatment Reason: Nursing care;Pain     Therapy/Group: Individual Therapy  [name] 07/01/2023, 4:01 PM

## 2023-07-01 NOTE — Progress Notes (Signed)
 Physical Therapy Session Note  Patient Details  Name: JERREMY MAIONE MRN: [id number] Date of Birth: 04/19/1966  Today's Date: 07/01/2023 PT Individual Time: 8954-8884 PT Individual Time Calculation (min): 30 min   Short Term Goals: Week 2:  PT Short Term Goal 1 (Week 2): pt will perform SBT with mod a consistently PT Short Term Goal 2 (Week 2): Pt will tolerate standing with assist or standing frame x 3 min PT Short Term Goal 3 (Week 2): Pt will verbalize pressure relief frequency and duration  Skilled Therapeutic Interventions/Progress Updates:      Therapy Documentation Precautions:  Precautions Precautions: Fall, Cervical Precaution Comments: hx of many falls, severe B LE hypertonia with muscle spasms, verbally went through cervical precautions throughout session Required Braces or Orthoses: Cervical Brace Cervical Brace: Hard collar, Other (comment) (OOB) Restrictions Weight Bearing Restrictions Per Provider Order: No Other Position/Activity Restrictions: cervical precautions General: PT Amount of Missed Time (min): 45 Minutes PT Missed Treatment Reason: Nursing care;Pain  Pt received seated in TIS with nurse and tech present for vitals assessment as pt S/O reports patient recently passed out. Patient yelling due to increase pain and spasms, deferred slide board and opted for dependent maxi move transfer x 3 with additional lateral support to prevent limbs from mobilizing outside sling. Pt returned to bed and nurse present at bedside for I&O cath, PT updated PA of situation. PA, PT and nurse engaged in discussion with pt and significant other regarding therapy schedules and cathing times. PT notified scheduler and primary therapist. Pt left in care of PA.     Therapy/Group: Individual Therapy  Bevely Fonder Bevely Fonder PT, DPT  07/01/2023, 12:51 PM

## 2023-07-02 DIAGNOSIS — G959 Disease of spinal cord, unspecified: Secondary | ICD-10-CM | POA: Diagnosis not present

## 2023-07-02 NOTE — Plan of Care (Signed)
 Pt's plan of care adjusted to 15/7 after speaking with care team and discussed with MD in team conference as pt currently unable to tolerate current therapy schedule with OT, PT, and SLP.

## 2023-07-02 NOTE — Progress Notes (Signed)
 PROGRESS NOTE   Subjective/Complaints:  Pt felt a little better this AM  had period of time overnight that could move LLE without pain/spasms.  Was encouraged.  Still having full body spasms this AM- but better night per fiance'- less confused and not undressing self anymore.  Had good BM with bowel program- not documented in note.  But said was too sleepy for PT yesterday- fell asleep or passed out on them.  But came right back- BP was checked afterwards and was OK, but took awhile to do it sounds like- sounds vasovagal episode.   Spasms extremely painful this AM. Feels like having a BM this AM  Also was more confused this AM per staff- didn't recognize [facility] as his PT- started Keppra  yesterday- not sure if related?  Caths going OK- fiance doing most of time  Per OT, doing transfers better in spite of spasticity.    ROS:  + spasms - ongoing- bothersome +neurogenic bladder - ongoing +confusion/sundowning- a little better  I spent a total of    minutes on total care today- >50% coordination of care- due to    Negative except for HPI  Objective:   No results found. Recent Labs    07/01/23 0525  WBC 6.7  HGB 11.5*  HCT 33.5*  PLT 338     Recent Labs    07/01/23 0525  NA 138  K 4.0  CL 102  CO2 26  GLUCOSE 93  BUN 8  CREATININE 0.66  CALCIUM 9.7      Intake/Output Summary (Last 24 hours) at 07/02/2023 1449 Last data filed at 07/02/2023 1340 Gross per 24 hour  Intake 110 ml  Output 2500 ml  Net -2390 ml        Physical Exam: Vital Signs Blood pressure 118/79, pulse 100, temperature 98.1 F (36.7 C), temperature source Oral, resp. rate 18, height 5' 9 (1.753 m), weight 92 kg, SpO2 100%.    General: awake, alert, appropriate, supine in bed- fiance' at bedside; NAD HENT: conjugate gaze; oropharynx moist CV: regular to mildly tachycardic rate and regular rhythm; no JVD Pulmonary: CTA B/L; no  W/R/R- good air movement GI: soft, NT, ND, (+)BS- normoactive Psychiatric: appropriate- flat affect- significant Neurological: somewhat confused- but oriented with help Multiple full body spasms obviously painful this Am when I was in room- ~ 5x.  Ext: no clubbing, cyanosis, or edema Skin- healing blister on top of L foot-stable -Area of discoloration darkened skin on right heel, does not appear to be DTI  Neurological: Awake, alert, oriented x 3.  Follows all simple commands.  1-5: Left lower extremity MAS 2 knee extensors, MAS 1 plantar flexors, MAS 2 adductors Right lower extremity MAS 3-4 knee extensors, MAS 3 plantarflexors, MAS 3 adductors  Strength: Moving bilateral upper extremities antigravity against resistance; bilateral lower extremities not quite antigravity in hip flexion Sensation: Altered below T4  Prior:  Comments: Ue's 5-/5 throughout on exam B/L RLE- HF 2-/5; KE 2-/5; DF 4-/5; and PF 4-/5 LLE- HF 2-/5; KE 2-/5; DF 2/5; PF 2/5- spasticity still present Sensory level at T4 and below-      Assessment/Plan: 1. Functional deficits which require 3+  hours per day of interdisciplinary therapy in a comprehensive inpatient rehab setting. Physiatrist is providing close team supervision and 24 hour management of active medical problems listed below. Physiatrist and rehab team continue to assess barriers to discharge/monitor patient progress toward functional and medical goals  Care Tool:  Bathing    Body parts bathed by patient: Face   Body parts bathed by helper: Right arm, Left arm, Chest, Abdomen, Front perineal area, Buttocks, Right upper leg, Left upper leg, Right lower leg, Left lower leg (wife assisted pt with bath prior to OT evaluation)     Bathing assist Assist Level: Total Assistance - Patient < 25%     Upper Body Dressing/Undressing Upper body dressing   What is the patient wearing?: Pull over shirt    Upper body assist Assist Level: Minimal Assistance  - Patient > 75%    Lower Body Dressing/Undressing Lower body dressing      What is the patient wearing?: Pants, Incontinence brief     Lower body assist Assist for lower body dressing: Total Assistance - Patient < 25%     Toileting Toileting    Toileting assist Assist for toileting: 2 Helpers     Transfers Chair/bed transfer  Transfers assist     Chair/bed transfer assist level: 2 Helpers     Locomotion Ambulation   Ambulation assist   Ambulation activity did not occur: Safety/medical concerns (limited by extreme tone)          Walk 10 feet activity   Assist  Walk 10 feet activity did not occur: Safety/medical concerns        Walk 50 feet activity   Assist Walk 50 feet with 2 turns activity did not occur: Safety/medical concerns         Walk 150 feet activity   Assist Walk 150 feet activity did not occur: Safety/medical concerns         Walk 10 feet on uneven surface  activity   Assist Walk 10 feet on uneven surfaces activity did not occur: Safety/medical concerns         Wheelchair     Assist Is the patient using a wheelchair?: Yes Type of Wheelchair: Manual Wheelchair activity did not occur: Safety/medical concerns  Wheelchair assist level: Dependent - Patient 0% Max wheelchair distance: 150    Wheelchair 50 feet with 2 turns activity    Assist    Wheelchair 50 feet with 2 turns activity did not occur: Safety/medical concerns   Assist Level: Dependent - Patient 0%   Wheelchair 150 feet activity     Assist  Wheelchair 150 feet activity did not occur: Safety/medical concerns   Assist Level: Dependent - Patient 0%   Blood pressure 118/79, pulse 100, temperature 98.1 F (36.7 C), temperature source Oral, resp. rate 18, height 5' 9 (1.753 m), weight 92 kg, SpO2 100%.  Medical Problem List and Plan: 1. Functional deficits secondary to Cervical myelopathy s/p ACDF- back after sepsis             -patient  may  shower             -ELOS/Goals: 3-4 weeks- min A hopefully- likely mod to max A Grounds pass ordered  D/c 07/17/23 -have counseled pt/wife about why CT myelo wasn't performed. Discussed spasticity.  D/w pt and fiance that [facility] doesn't think he could get ITB pump due to lack of flow of CSF at distal end of Spinal cord- where he needs it most/due to more spasticity  in legs than arms Con't CIR PT and OT- will check with other SCI docs about pt's spasticity for any ideas-  Team conference today to f/u on progress Reinforced he is NOT a candidate for ITB pump 2.  Antithrombotics: -DVT/anticoagulation:  Pharmaceutical: Lovenox              -antiplatelet therapy: N/A 3. Pain Management/spasticity:  MS contin  15 mg bid, Oxycodone  prn. Lidoderm  patch for local measures. Tylenol  650 mg qid.             --bilateral hip pain with attempts at rolling.              --on Baclofen  20 mg qid, Valium  5 mg qid with Zanaflex  4 mg qid prn for spasticity.  -12/27 Started Duloxetine  20 mg at bedtime 12/25- titrate up every 3-4 days as tolerated  -bumped baclofen  to 25mg  qid, consider scheduling tizanidine   -added gabapentin  100mg  tid for injury level/chest wall pain/spasticity 12/29- pushing antispasmodics given the severity of his spasticity. Continue to titrate neuro-sedation permitting -increased baclofen  to 30mg  qid 12/28 -continue valium , consider scheduled tizanidine  also -increased duloxetine  (now in am) to 30mg  -pt refuses to take gabapentin  as he doesn't want more potentially sedating medication--I stopped it yesterday 12/30- will add Dantrolene  50 mg at bedtime- explained it's gradual onset- will increase q5 days 12/31- will stop Dantrolene - so sleepy this AM- calling Dr Carollee to see what he thinks about placing ITB pump sooner than later? 1/1- educated fiance and pt that cannot do ITB pump due to lack of flow of CSF- p[er [facility]- reason couldn't get LP for CT myelogram Will wait for now on Keppra , but  if sedation gets better, can try 1/2- sinc ehaving confusion, will stop Zanaflex - in rare cases, can cause hallucinations (I've sene in other patients) and will give more baclofen  prn 10 mg TID prn 1/3- tone if anything looking worse- don't know what else can do right now except Keppra , but will need to reduce Valium , or something else to try the Keppra  1/4:  discussed with patient, wish to wait until primary team is back on Monday to make major medication adjustments/add Keppra - 1/5: tone remains severe, again patient wishes major medication adjustments per primary team, but could likely retry Zanaflex  given confusion has not improved 1/6- will add keppra  250 mg BID and reduce MS Contin  to 1x/day in evening. To reduce sedation during day.  1/7- pain more in AM yesterday, but did better overnight- let's see if Keppra  helps or hurts?will d/w SCI docs to see what opther options there might be 4. Mood/Behavior/Sleep: LCSW to follow for evaluation and support.              -antipsychotic agents: N/A  -12/27--trazodone  and melatonin helpful 12/28 doing better with earlier scheduling of trazodone  and melatonin 1/2- having some confusion at night- not sure if due to Zanaflex , which I sotpped, but will give Seroquel  25 mg at bedtime to help as well -d/c trazodone  1/3- will increase Seorquel to 50 mg at bedtime- took clothes off last night- got confused, but calmer, per fiance'.  1/4: Increase seroquel  to 75 mg nightly, and retimed to 8 PM 1-5: DC melatonin due to vivid dreams.  No active hallucinations last night, so continue current dose of Seroquel ..  Patient endorsed passive SI yesterday, resolved today, he is agreeable to immediately notifying physician and staff if this recurs--psychiatry not consulted due to patient being no active risk of self-harm and with recent start of duloxetine   1/6- will increase Duloxetine  to 60 mg at bedtime- since sedated during day, to reduce daytime sedation.  5.  Neuropsych/cognition: This patient is? capable of making decisions on his own behalf. 6. Skin/Wound Care: Routine pressure relief measures.   -has developed left heel sore d/t tone, struggles to fit in [facility]  -heel dressed, ordered prevalon boots so that he's better able to tolerate wear-- -12/29 prevalon boots in place this am--tolerating -1/4: right heel ?DTI on exam--more consistent with skin color variation.  Other wounds appear improving/stable.  7. Hyponatremia: continue to monitor Na weekly  12/30- Na 139  8. Klebsiella aerogenes bacteremia/sepsis /UTI: levaquin   -abx completed per ID recs--7 days  9. Neurogenic bowel: Continue Senna S 2 tabs BID. Miralax  daily-->has been incontinent of bowel and question due to antibiotics and/or laxatives --Suppository daily after supper-->has refused for past 3 nights.  --would prefer enema three times a week as at home. Have suggested using mini enema 1/7- LBM last night with Enemeze 10. Neurogenic bladder: Foley was replaced on acute-->will d/c and start bladder program. (patient would like to resume) --Monitor voiding with PVR checks             --use coude cath and monitor for recurrent hematuria             --continue flomax \  12/29 cath volumes generally in range until this morning--continue current schedule  12/31- Cath volumes looking good- cathing q4 hours  1/1- per fiance' Draining slow/caths- will speak to nursing- on max dose of Flomax  to try and get pt to void.   1/2- Pt's cathing is doing well- went 1 time 5 hours 45 minutes last night- but fiance concerned they are waiting for her to do- will need help with this.   1/3-5 cath volumes going well --continue cathing  1/6- will change caths to q4 hours scheduled to reduce spasticity  1/7- fiance doing most of caths 11. Neuropathy/at level SCI pain: Was on Cymbalta  PTA-resumed at 20 mg at bedtime, continue--increased to 30mg  daily effective 12/28  12/30- doesn't want Cymbalta  for  pain, but willing to take for mood  1-5: See #4 above  1/6- will increase Duloxetine  to 60 mg at bedtime for pain/mood.  12. Abd pain/gas/at level SCI pain  1/6- will add Gasx 160 mg QID and also Keppra  250 mg BID   1/7- No major changes today- will monitor closely    I spent a total of  45  minutes on total care today- >50% coordination of care- due to  Team conference; d/w other physician about spasticity- might look into phenol injections to help- also monitoring more confusion? Or just 1 day?- will make 15/7 per therapies. D/w nursing as well    LOS: 14 days A FACE TO FACE EVALUATION WAS PERFORMED  Jan Olano 07/02/2023, 2:49 PM

## 2023-07-02 NOTE — Progress Notes (Signed)
 Patient refused bowel program due to having company. Patient was educated and understands the importance of bowel program.   Leon Rodriguez

## 2023-07-02 NOTE — Progress Notes (Signed)
 Occupational Therapy Session Note  Patient Details  Name: Leon Rodriguez MRN: [id number] Date of Birth: [birth year]  Today's Date: 07/02/2023 OT Individual Time: 1345-1453 OT Individual Time Calculation (min): 68 min    Skilled Therapeutic Interventions/Progress Updates:  Skilled OT intervention completed with focus on functional transfers, anterior weight shifting, postural stability, family education, pain management. Pt received upright in w/c eating lunch, agreeable to session. Unrated pain reported in Lt hip, BLE and intermittently Rt elbow; nurse notified of pain med request. OT offered rest breaks, repositioning, and heat for pain reduction.  Pt declined self-care needs. Aspen collar already on. Transported dependently in w/c <> gym. Retrieved shorter slideboard for efficiency with transfers. BLE flexor/extensor tone present therefore applied 10 lb weights to BLE for management and positioning assist during transfers. +2 needed to place board under Rt hip, then with therapists leg in between pt's thighs to maintain neutral alignment, pt completed heavy mod A transfer on board > Rt with 2nd person guarding at trunk and hips. Max cues needed for sequencing and anterior weight shifting as pt trying to push hips vs off load bottom for increased efficiency.  Pt able to sit statically with close supervision with BUE on mat, however without UE support needed CGA/min A statically and dynamically. Discussed mechanics of anterior weight shift to off load bottom via both hands flat on mat and practiced with OT in front of pt blocking legs and 2nd person behind pt for safety. Completed x10 lift offs, with only about 10% buttock clearance, but needed cues for anterior weight shifting. Had pt anterior weight shift with 2 lb med ball in both hands; pt mostly challenged by postural balance as he felt like a fall is near.   Education provided to pt's girlfriend who was present who had series of questions  about transfer level and level of assist needed by his current DC date. Advised that slideboard is likely, however to be determined pending pt's progress as girlfriend reports it will only be her and she recognizes staff using 2 people all the time. We discussed OT technique of managing tone for safety, but also other interventions.  Discussed slideboard transfer with girlfriend, then dependent for board placement under Lt hip, then max A of 1 behind pt, and min A for 2nd person in front of pt for positioning of BLE due to increased flexor tone and legs curling underneath him.  Pt completed the following intervals while seated at the SCIFIT to promote global/BUE endurance needed for independence with BADLs and functional transfers: -2 min, level 1, forwards Pt verbalized fatigue and inability to complete rest of task.  Back in room, pt remained seated in TIS w/c with girlfriend present, heat applied to Lt hip, with all needs in reach at end of session.   Therapy Documentation Precautions:  Precautions Precautions: Fall, Cervical Precaution Comments: hx of many falls, severe B LE hypertonia with muscle spasms, verbally went through cervical precautions throughout session Required Braces or Orthoses: Cervical Brace Cervical Brace: Hard collar, Other (comment) (OOB) Restrictions Weight Bearing Restrictions Per Provider Order: No Other Position/Activity Restrictions: cervical precautions    Therapy/Group: Individual Therapy  Lorrayne FORBES Fritter, MS, OTR/L  07/02/2023, 3:14 PM

## 2023-07-02 NOTE — Progress Notes (Signed)
 Physical Therapy Session Note  Patient Details  Name: CHRISTPHOR GROFT MRN: [id number] Date of Birth: June 01, 1966  Today's Date: 07/02/2023 PT Individual Time: 1134-1205 PT Individual Time Calculation (min): 31 min   Short Term Goals: Week 2:  PT Short Term Goal 1 (Week 2): pt will perform SBT with mod a consistently PT Short Term Goal 2 (Week 2): Pt will tolerate standing with assist or standing frame x 3 min PT Short Term Goal 3 (Week 2): Pt will verbalize pressure relief frequency and duration  Skilled Therapeutic Interventions/Progress Updates:    pt received in TIS  and agreeable to therapy. Pt w/c c/o pain this session, and much more alert vs on 1/6. Pt states he had intense, full body spasm over night which gave him a revelation. He states he now knows his spasms are my friend not my enemy. He also went on to describe a vivid dream which he associates with this revelation. Pt states he now has more motion in his L foot. On assessment, did note less tone with passive motion and pt with trace plantar flexion in that foot. Also note increased control of RLE, with pt able to actively extend knee to near full ROM, improved with tapping facilitation at quad. Pt returned to room and remained in TIS, was left with all needs in reach.   Therapy Documentation Precautions:  Precautions Precautions: Fall, Cervical Precaution Comments: hx of many falls, severe B LE hypertonia with muscle spasms, verbally went through cervical precautions throughout session Required Braces or Orthoses: Cervical Brace Cervical Brace: Hard collar, Other (comment) (OOB) Restrictions Weight Bearing Restrictions Per Provider Order: No Other Position/Activity Restrictions: cervical precautions General:        Therapy/Group: Individual Therapy  [name] 07/02/2023, 12:09 PM

## 2023-07-02 NOTE — Progress Notes (Signed)
 Occupational Therapy Session Note  Patient Details  Name: Leon Rodriguez MRN: [id number] Date of Birth: 02-03-1966  Today's Date: 07/02/2023 OT Individual Time: 1030-1100 OT Individual Time Calculation (min): 30 min    Skilled Therapeutic Interventions/Progress Updates:    1:1 Pt received the hallway talking; wife present. Pt taken to the dayroom focus on functional transfers and static to dynamic sitting balance. Pt asking for how to position his left foot better by wedging it with a cone - discussed this option and deferred due to increased risk of a pressure injury.  Discussed wearing shoes for better traction and better supportive positioning. Pt's shoes donned total A later in the session.   With 4 inch step place under pt's feet pt transferred with min A with +2 with max to maintain position feet positioning and mod cues for maintaining forward weight shift. Pt able to sit EOB with hands in lap with contact guard to supervision. Transitioned to leaning forward to touch ankles and returning to sitting at midline and then laterally reaching at and outside of BOS to targets with contact guard.  Then worked to come down in sidelying and returning to sitting; starting with two pillows to come down on to and then transitioning to only one pillow. Simulating getting into position for slide board to be place in prep for transfers.  Then transferred back into TIS w/c. Pt initially needed max A +2 to get onto the board to get started but then was able to transfer with min A to get into the chair ( sliding ) but with max A to maintain feet in contact with floor and maintain forward weight shift with tactile cues.   Shoes were donned and LEs were in proper antatomic position in leg rests.  Rehab tech and wife returned to the room and left set up in prep for lunch.   Therapy Documentation Precautions:  Precautions Precautions: Fall, Cervical Precaution Comments: hx of many falls, severe B LE hypertonia  with muscle spasms, verbally went through cervical precautions throughout session Required Braces or Orthoses: Cervical Brace Cervical Brace: Hard collar, Other (comment) (OOB) Restrictions Weight Bearing Restrictions Per Provider Order: No Other Position/Activity Restrictions: cervical precautions  Pain: Pain Assessment Pain Scale: 0-10 Pain Score: 4  in hips - able to continue to work with rest breaks as needed   Therapy/Group: Individual Therapy  Claudene Nest [facility] 07/02/2023, 12:13 PM

## 2023-07-02 NOTE — Progress Notes (Addendum)
 Patient ID: Leon Rodriguez, male   DOB: 10-31-1965, 58 y.o.   MRN: [id number]  SW met with pt and pt wife in room to provide updates from team conference, d/c date remains 1/23, and in-depth discussion about pt requiring support at home and DME. Wife understands once DME recs have been made, SW will provide updates. Understands there is a discussion about a specialty w/c and possibly needed at discharge. In depth discussion about PCS and CAP/DA. SW will submit CAP/DA referral. SW explained PCS referral faxed to pt insurance prior to his return to [facility]. No HHA preference.   SW left message for [facility] CAP/DA to discuss submitting referral.   SW sent HHPT/OT/SLP/aide referral to [facility] and waiting on follow-up. *SW submitted referral.   Graeme Jude, MSW, LCSW Office: [phone] Cell: [phone] Fax: [phone]

## 2023-07-02 NOTE — Patient Care Conference (Signed)
 Inpatient RehabilitationTeam Conference and Plan of Care Update Date: 07/02/2023   Time: 11:29 AM    Patient Name: Leon Rodriguez      Medical Record Number: [id number]  Date of Birth: 07/27/1965 Sex: Male         Room/Bed: 4W04C/4W04C-01 Payor Info: Payor: [place] MEDICAID PREPAID HEALTH PLAN / Plan: [place] MEDICAID HEALTHY BLUE / Product Type: *No Product type* /    Admit Date/Time:  06/18/2023  3:34 PM  Primary Diagnosis:  Cervical myelopathy [facility])  Hospital Problems: Principal Problem:   Cervical myelopathy (HCC) Active Problems:   Adjustment disorder with depressed mood    Expected Discharge Date: Expected Discharge Date: 07/17/23  Team Members Present: Physician leading conference: Dr. Megan Lovorn Social Worker Present: Graeme Jude, LCSWA Nurse Present: Barnie Ronde, RN;Edan Juday [place], RN PT Present: Schuyler Batter, PT OT Present: Delon Sharps, OT PPS Coordinator present : Eleanor Colon, SLP     Current Status/Progress Goal Weekly Team Focus  Bowel/Bladder   Patient is being catheterized Q4.  He is on a bowel program.   Continue with both programs as they will more than likely be a permanent part of his continued care.   Ensure cath happen Q4.  Continue bowel program.    Swallow/Nutrition/ Hydration               ADL's   BADLs at bed level with max A/tot A; ongoing BLE etensor tone (not improving); TB transfers with max A +2 for safety-minimal improvements this past week   mod A overall; bathing-min A   BADLs, tranfsers, tone/pain mgmt    Mobility   mod-max supine>sit, SBT with mod-max, +2 for safety. Still limited by tone   mod A  transfers, care giver education, WB for tone management    Communication                Safety/Cognition/ Behavioral Observations               Pain   Patient is atuned to his pain and pain scale, what meds work for him and what doesn't.   Continue to have pain that is well managed.    Promote the use of multi-modals for additional comfort.  Heat, ice, massage, etc.    Skin   Patient skin is currently good.   Maintain current skin integrity.  Keep patient moving.  He does turn himself in bed. Promote diet high in protein.      Discharge Planning:  D/c to home with s/o [name]. She is the only caregiver. Reports her dtr can help PRN some evenings. Disability referral sent to [facility]. PCS referral sent from previous admission to insurance. SW will confirm there are no barriers to discharge.   Team Discussion: Cervical myelopathy. C-collar. Severe spasticity/spasms.  Extensor tone is an issue. Not a candidate for baclofen  pump. Uncontrolled pain managed with scheduled and PRN medications. Abdominal cramps better. Wife trained on bowel/bladder program. Getting confused at night.  Working on transfers, will it be safe?  Will he need hoyer?  Patient on target to meet rehab goals: yes, progressing toward goals with discharge date of 07/17/23  *See Care Plan and progress notes for long and short-term goals.   Revisions to Treatment Plan:  Wean MS contin  to HS.  Keppra  added. I/O caths changed to every 4 hours. Added Seroquel . Monitor labs and VS.  Teaching Needs: Medications, safety, self care, transfer training, skin care, bowel/bladder education, etc.   Current Barriers  to Discharge: Decreased caregiver support, Neurogenic bowel and bladder, and Weight  Possible Resolutions to Barriers: Family education Will need 2 people for transfers Independent with bowel and bladder program Diet modifications for healthy weight Order recommended DME     Medical Summary Current Status: severe spasticity-spassms, extensor tone, etc- severe pain; confused at night- and sometimes during day in last 24 hours- severe uncotnrolled pain due to SCI/nerve pain, etc  Barriers to Discharge: Behavior/Mood;Neurogenic Bowel & Bladder;Incontinence;Self-care education;Spasticity;Other  (comments);Uncontrolled Pain  Barriers to Discharge Comments: uncotnrolled spasticity and pain and confusion- full body spasms-taking clothes off at night, etc Possible Resolutions to [employer] Focus: started seroquel - will d/w other physicatrists- added keppra  for spasticity- cannot do ITB pump due to lack of CSF flow- d/c 1/22   Continued Need for Acute Rehabilitation Level of Care: The patient requires daily medical management by a physician with specialized training in physical medicine and rehabilitation for the following reasons: Direction of a multidisciplinary physical rehabilitation program to maximize functional independence : Yes Medical management of patient stability for increased activity during participation in an intensive rehabilitation regime.: Yes Analysis of laboratory values and/or radiology reports with any subsequent need for medication adjustment and/or medical intervention. : Yes   I attest that I was present, lead the team conference, and concur with the assessment and plan of the team.   Darice LITTIE Boring 07/02/2023, 8:50 PM

## 2023-07-02 NOTE — Progress Notes (Signed)
 Physical Therapy Session Note  Patient Details  Name: Leon Rodriguez MRN: [id number] Date of Birth: [birth year]  Today's Date: 07/02/2023 PT Individual Time: 0930-1026 PT Individual Time Calculation (min): 56 min   Short Term Goals: Week 1:  PT Short Term Goal 1 (Week 1): pt will perform Supine<>sit with mod a of 1 consistently PT Short Term Goal 1 - Progress (Week 1): Progressing toward goal PT Short Term Goal 2 (Week 1): Pt will perform sit<>stands using LRAD with mod A of  2 PT Short Term Goal 2 - Progress (Week 1): Not met PT Short Term Goal 3 (Week 1): Pt will perform bed<>chair transfers using LRAD with mod A of 2 and +2 for safety as needed Week 2:  PT Short Term Goal 1 (Week 2): pt will perform SBT with mod a consistently PT Short Term Goal 2 (Week 2): Pt will tolerate standing with assist or standing frame x 3 min PT Short Term Goal 3 (Week 2): Pt will verbalize pressure relief frequency and duration  Skilled Therapeutic Interventions/Progress Updates:   Received pt semi-reclined in bed with significant other at bedside. Pt agreeable to PT treatment and reported pain in BLEs and low back (unrated) but reported having a revelation last night and spasms/tone in BLE has improved. Pt upset that nursing staff would not provide him with physical therapy last night - explained differences in roles but pt insisted on having night time PT - explained that this is not an option. Session with emphasis on functional mobility/transfers and global strengthening and endurance.   Pt extremely particular throughout session, fixated on grip socks being positioned the right way and therapist constantly adjusting and tightening them to make them look like a foot. Donned pants in supine with max A and rolled L/R with mod/max A using bedrails and dependent to pull pants over hips. Pt transferred semi-reclined<>sitting R EOB with HOB elevated and use of bedrails with mod A for BLE management and trunk  control. Initially tone in BLE causing legs to adduct/scissor - increased time spent stretching BLE into hip abduction, ankle ER, and knee extension (as pt allowed) and therapist placed feet in between pts feet to keep BLEs abducted during transfer. Pt transferred bed<>TIS WC via slideboard with mod A + 2 (due to availability) and slightly uphill - paused throughout to adjust BLEs and maintain contact with floor along the way. Then, more than reasonable time spent adjusting legs/feet on legrests per pt preference. Pt particular about therapist wrapping R legrest with coband to match the L, despite not needing coband at all. Pt requested to groom at sink. Washed face and applied face oils with set up assist.   Transported to/from room in TIS WC dependently with multiple stops to adjust LLE due to spasms. Pt performed bicep curls with 3lb dumbbell x15 bilaterally and horizontal chest press with 4lb dumbbell 2x10 with emphasis on UE strength. Returned to room and concluded session with pt sitting in TIS WC left in care of significant other.   Therapy Documentation Precautions:  Precautions Precautions: Fall, Cervical Precaution Comments: hx of many falls, severe B LE hypertonia with muscle spasms, verbally went through cervical precautions throughout session Required Braces or Orthoses: Cervical Brace Cervical Brace: Hard collar, Other (comment) (OOB) Restrictions Weight Bearing Restrictions Per Provider Order: No Other Position/Activity Restrictions: cervical precautions  Therapy/Group: Individual Therapy Therisa HERO Zaunegger Therisa Stains PT, DPT 07/02/2023, 6:40 AM

## 2023-07-03 DIAGNOSIS — G959 Disease of spinal cord, unspecified: Secondary | ICD-10-CM | POA: Diagnosis not present

## 2023-07-03 MED ORDER — HYDROCODONE-ACETAMINOPHEN 7.5-325 MG PO TABS
1.0000 | ORAL_TABLET | ORAL | Status: DC | PRN
  Administered 2023-07-03 – 2023-07-10 (×6): 1 via ORAL
  Filled 2023-07-03 (×8): qty 1

## 2023-07-03 NOTE — Progress Notes (Signed)
 Occupational Therapy Session Note  Patient Details  Name: MARTAVIUS LUSTY MRN: [id number] Date of Birth: September 10, 1965  Today's Date: 07/03/2023 OT Individual Time: 1115-1200 OT Individual Time Calculation (min): 45 min    Short Term Goals: Week 2:  OT Short Term Goal 1 (Week 2): Pt will be able to tolerate sitting EOB with min A and no back support to wash UB with min A. OT Short Term Goal 1 - Progress (Week 2): Met OT Short Term Goal 2 (Week 2): Pt will be able to don a shirt with min A sitting EOB with no back support. OT Short Term Goal 2 - Progress (Week 2): Progressing toward goal OT Short Term Goal 3 (Week 2): Pt will be able wash LE from bed level with mod A. OT Short Term Goal 3 - Progress (Week 2): Progressing toward goal OT Short Term Goal 4 (Week 2): pt will be able to rise to stand in a standard stedy lift with max A of 2. OT Short Term Goal 4 - Progress (Week 2): Progressing toward goal  Skilled Therapeutic Interventions/Progress Updates:    Pt resting in TIS w/c upon arrival. OT intervention with focus on BUE strengthening and discharge planning. Ball taps with 5# bar-3x10. Discussed recommendations for seating options and transfer methods at home. Discussed use of manual hoyer for transfers. Demonstrated use of hoyer. Recommended use of TIS w/c for comfort and ease of use with hoyer. Pt verbalized understanding of recommendations. Pt returned to room and remained in w/c with SO present.   Therapy Documentation Precautions:  Precautions Precautions: Fall, Cervical Precaution Comments: hx of many falls, severe B LE hypertonia with muscle spasms, verbally went through cervical precautions throughout session Required Braces or Orthoses: Cervical Brace Cervical Brace: Hard collar, Other (comment) (OOB) Restrictions Weight Bearing Restrictions Per Provider Order: No Other Position/Activity Restrictions: cervical precautions   Pain: Pain Assessment Pain Scale: 0-10 Pain  Score: 4  Pain intensity increased with periodic spasms   Therapy/Group: Individual Therapy  Maritza Debby Mare 07/03/2023, 12:17 PM

## 2023-07-03 NOTE — Progress Notes (Addendum)
 Patient ID: Leon Rodriguez, male   DOB: 1966/06/08, 58 y.o.   MRN: [id number]  SW sent HHPT/OT/SLP/aide referral to [facility] to follow-up about referral.  *referral declined.   SW sent referral to [facility].  *referral accepted.  SW sent referral to [facility], [facility], [facility], [facility], and [facility] and waiting on follow-up.  Declined HHAs [facility]- not in network  Graeme Jude, MSW, LCSW Office: [phone] Cell: [phone] Fax: [phone]

## 2023-07-03 NOTE — Progress Notes (Signed)
 Occupational Therapy Session Note  Patient Details  Name: Leon Rodriguez MRN: [id number] Date of Birth: 29-Apr-1966  Today's Date: 07/03/2023 OT Individual Time: 8575-8493 OT Individual Time Calculation (min): 42 min    Short Term Goals: Week 3:  OT Short Term Goal 1 (Week 3): Pt will be able to don a shirt with min A sitting EOB with no back support. OT Short Term Goal 2 (Week 3): Pt will be able wash LE from bed level with mod A. OT Short Term Goal 3 (Week 3): pt will be able to rise to stand in a standard stedy lift with max A of 2. OT Short Term Goal 4 (Week 3): Pt will perform TB transfers with mod A+1 on a level surface  Skilled Therapeutic Interventions/Progress Updates:  Pt greeted seated in TIS, pt handed off directly from PTA,  pt agreeable to OT intervention.      Functional mobility/transfers: pt completed SB transfer back to bed to pts R side with MODA +2. Total A needed to place board, pt was able to accurately set up body mechanics of transfer. Pt completed sit>sidelying with MAX A +2. Pt required total A +2 to reposition in bed. Pt was able to roll R<>L for brief change with MOD A.   Therapeutic activity: transitioned into standing frame to allow pt to be in weightbearing position to decrease spasms and provide NMR to BLEs. Pt able to complete one stand in standing frame but then LLE transitioned into flexor tone, terminated task for safety.   ADLs:  LB dressing: donned/doffed pants from bed level with total A  Toileting: small BM noted in brief, total A +2 for all aspects of toileting tasks.    General: of note, pts spasms seemed to be worse than normal with pt having spasms anytime this therapist would touch BLEs, increased time needed during all transitions.   Ended session with pt supine in bed with all needs within reach and bed alarm activated.                   Therapy Documentation Precautions:  Precautions Precautions: Fall, Cervical Precaution Comments: hx  of many falls, severe B LE hypertonia with muscle spasms, verbally went through cervical precautions throughout session Required Braces or Orthoses: Cervical Brace Cervical Brace: Hard collar, Other (comment) (OOB) Restrictions Weight Bearing Restrictions Per Provider Order: No Other Position/Activity Restrictions: cervical precautions  Pain: no pain reported however pt appeared to be experiencing general pain during episodes of spasms.    Therapy/Group: Individual Therapy  Ronal Mallie Needy 07/03/2023, 3:11 PM

## 2023-07-03 NOTE — Progress Notes (Signed)
 Patient refused bowel program again. Patient states he would like to take some time off from the bowel program. Patient educated and understands.   Natale Thoma [place]

## 2023-07-03 NOTE — Progress Notes (Signed)
 Physical Therapy Session Note  Patient Details  Name: Leon Rodriguez MRN: [id number] Date of Birth: 1965/08/18  Today's Date: 07/03/2023 PT Individual Time: 1000-1045 and 1345-1425 PT Individual Time Calculation (min): 45 min and 40 min  Short Term Goals: Week 2:  PT Short Term Goal 1 (Week 2): pt will perform SBT with mod a consistently PT Short Term Goal 2 (Week 2): Pt will tolerate standing with assist or standing frame x 3 min PT Short Term Goal 3 (Week 2): Pt will verbalize pressure relief frequency and duration  Skilled Therapeutic Interventions/Progress Updates: Pt presented sitting EOB with SO present donning socks and shoes. Pt agreeable to therapy. Pt states pain well controlled at start of session but hip pain increased during session. Pain exacerbated in R hip particularly with R lateral leans. Pt set up with Slide board and completed transfer to TIS with heavy modA. During transfer pt's SO indicated pt may have had BM but unsure. Pt agreeable to try to stand in [place]  to check. Discussed this will also facilitate weight bearing through BLE and contribute to tone management. Stedy obtained and on first attempt pt performed stand with max to total A x 2 however pt was unable to come to complete stand. Pt noted to have +BM. Pt agreeable to try an additional stand and use Stedy to transfer to bed. Pt completed second stand with maxA x 2 however pt was able to come to more upright position. Pt transferred to bed and completed last standing attempt with pt noted to have strong forward flexed posture to complete transfer back to bed. Pt required heavy modA for sit to supine transfer with PTA assisting primarily with BLE. Pt then completed several rolls L/R with min to modA to allow for peri-care and clothing management. Once completed pt used B rails to pull self into long sit then PTA assisted BLE to EOB. Pt was able to scoot anteriorly to EOB with minA and cues to lean anteriorly. PTA then set  up Slide board and pt completed Slide board transfer to L with modA. With verbal cues pt was able to use UE to reposition hips for improved alignment. Pt left in TIS at end of session with SO present call bell within reach and needs met.   Tx2: Pt presented in bed with SO and RT present agreeable to therapy. Pt agreeable to try using Stedy from EOB for attempting stands as well as transfer to TIS. Pt completed supine to sit with modA using bed rails to pull self up to long sit then having therapist assist with BLE management to EOB. Pt was able to scoot to EOB with increased time and CGA. Pt then set up in [place] with BLE secured with gait belt to [place]. Bed was elevated and pt completed Sit to stand from significant elevated bed x 4. First attempt required near total A and pt was unable to complete stand. On subsequent trials PTA used sheet for increased facilitation at hips with improved success. Pt noted to be able to improve standing (standing more erect with each stand from paddles). On last stand from [facility] to transfer to TIS pt was able to stand near full erect posture without use of sheet and nearing modA x 2. In TIS pt was able to scoot posteriorly while feet were still secured in [place] with near supervision (cues to lean anteriorly). Pt's SO then asking about use of Stedy at d/c which PTA indicated would not recommend at this  time, also discussed beginning of family education as gets closer to d/c and will discuss safest method of transfer for pt. Pt left in TIS at end of session with SO present and needs met.      Therapy Documentation Precautions:  Precautions Precautions: Fall, Cervical Precaution Comments: hx of many falls, severe B LE hypertonia with muscle spasms, verbally went through cervical precautions throughout session Required Braces or Orthoses: Cervical Brace Cervical Brace: Hard collar, Other (comment) (OOB) Restrictions Weight Bearing Restrictions Per Provider Order: No Other  Position/Activity Restrictions: cervical precautions General:   Vital Signs:      Therapy/Group: Individual Therapy  Leon Rodriguez 07/03/2023, 4:56 PM

## 2023-07-03 NOTE — Progress Notes (Signed)
 PROGRESS NOTE   Subjective/Complaints:  Per fiance' Oxy was cause of confusion- as soon as received Oxy, got really confused and took clothes off again. During day this time.  Will switch for Norco. .    ROS:  + spasms - ongoing- bothersome +confusion/sundowning-due to Oxy it appears  Pt denies SOB, abd pain, CP, N/V/C/D, and vision changes  Negative except for HPI  Objective:   No results found. Recent Labs    07/01/23 0525  WBC 6.7  HGB 11.5*  HCT 33.5*  PLT 338     Recent Labs    07/01/23 0525  NA 138  K 4.0  CL 102  CO2 26  GLUCOSE 93  BUN 8  CREATININE 0.66  CALCIUM 9.7      Intake/Output Summary (Last 24 hours) at 07/03/2023 1939 Last data filed at 07/03/2023 1705 Gross per 24 hour  Intake 360 ml  Output 1800 ml  Net -1440 ml        Physical Exam: Vital Signs Blood pressure 114/62, pulse 66, temperature 97.6 F (36.4 C), resp. rate 18, height 5' 9 (1.753 m), weight 92 kg, SpO2 100%.    General: awake, alert, appropriate, sitting EOB, with B/L UE control; NAD HENT: conjugate gaze; oropharynx moist CV: regular rate; no JVD Pulmonary: CTA B/L; no W/R/R- good air movement GI: soft, NT, ND, (+)BS Psychiatric: appropriate- flat Neurological: alert- propping self EOB with B/L UEs  Very few full body spasms obviously painful this Am when I was in room- ~ 5x.  Ext: no clubbing, cyanosis, or edema Skin- healing blister on top of L foot-stable -Area of discoloration darkened skin on right heel, does not appear to be DTI  Neurological: Awake, alert, oriented x 3.  Follows all simple commands.  1-5: Left lower extremity MAS 2 knee extensors, MAS 1 plantar flexors, MAS 2 adductors Right lower extremity MAS 3-4 knee extensors, MAS 3 plantarflexors, MAS 3 adductors  Strength: Moving bilateral upper extremities antigravity against resistance; bilateral lower extremities not quite antigravity in  hip flexion Sensation: Altered below T4  Prior:  Comments: Ue's 5-/5 throughout on exam B/L RLE- HF 2-/5; KE 2-/5; DF 4-/5; and PF 4-/5 LLE- HF 2-/5; KE 2-/5; DF 2/5; PF 2/5- spasticity still present Sensory level at T4 and below-      Assessment/Plan: 1. Functional deficits which require 3+ hours per day of interdisciplinary therapy in a comprehensive inpatient rehab setting. Physiatrist is providing close team supervision and 24 hour management of active medical problems listed below. Physiatrist and rehab team continue to assess barriers to discharge/monitor patient progress toward functional and medical goals  Care Tool:  Bathing    Body parts bathed by patient: Face   Body parts bathed by helper: Right arm, Left arm, Chest, Abdomen, Front perineal area, Buttocks, Right upper leg, Left upper leg, Right lower leg, Left lower leg (wife assisted pt with bath prior to OT evaluation)     Bathing assist Assist Level: Total Assistance - Patient < 25%     Upper Body Dressing/Undressing Upper body dressing   What is the patient wearing?: Pull over shirt    Upper body assist Assist Level: Minimal  Assistance - Patient > 75%    Lower Body Dressing/Undressing Lower body dressing      What is the patient wearing?: Pants, Incontinence brief     Lower body assist Assist for lower body dressing: Total Assistance - Patient < 25%     Toileting Toileting    Toileting assist Assist for toileting: 2 Helpers     Transfers Chair/bed transfer  Transfers assist     Chair/bed transfer assist level: 2 Helpers     Locomotion Ambulation   Ambulation assist   Ambulation activity did not occur: Safety/medical concerns (limited by extreme tone)          Walk 10 feet activity   Assist  Walk 10 feet activity did not occur: Safety/medical concerns        Walk 50 feet activity   Assist Walk 50 feet with 2 turns activity did not occur: Safety/medical concerns          Walk 150 feet activity   Assist Walk 150 feet activity did not occur: Safety/medical concerns         Walk 10 feet on uneven surface  activity   Assist Walk 10 feet on uneven surfaces activity did not occur: Safety/medical concerns         Wheelchair     Assist Is the patient using a wheelchair?: Yes Type of Wheelchair: Manual Wheelchair activity did not occur: Safety/medical concerns  Wheelchair assist level: Dependent - Patient 0% Max wheelchair distance: 150    Wheelchair 50 feet with 2 turns activity    Assist    Wheelchair 50 feet with 2 turns activity did not occur: Safety/medical concerns   Assist Level: Dependent - Patient 0%   Wheelchair 150 feet activity     Assist  Wheelchair 150 feet activity did not occur: Safety/medical concerns   Assist Level: Dependent - Patient 0%   Blood pressure 114/62, pulse 66, temperature 97.6 F (36.4 C), resp. rate 18, height 5' 9 (1.753 m), weight 92 kg, SpO2 100%.  Medical Problem List and Plan: 1. Functional deficits secondary to Cervical myelopathy s/p ACDF- back after sepsis             -patient may  shower             -ELOS/Goals: 3-4 weeks- min A hopefully- likely mod to max A Grounds pass ordered  D/c 07/17/23 -have counseled pt/wife about why CT myelo wasn't performed. Discussed spasticity.  D/w pt and fiance that [facility] doesn't think he could get ITB pump due to lack of flow of CSF at distal end of Spinal cord- where he needs it most/due to more spasticity in legs than arms Con't CIR PT and OT  Other physicians I spoke to suggest restrying Dantrolene - they don't usually see if cause a lot of sedation. Since so helpful in many cases, will d/w pt/fiance' again.  Reinforced he is NOT a candidate for ITB pump 2.  Antithrombotics: -DVT/anticoagulation:  Pharmaceutical: Lovenox              -antiplatelet therapy: N/A 3. Pain Management/spasticity:  MS contin  15 mg bid, Oxycodone  prn. Lidoderm  patch  for local measures. Tylenol  650 mg qid.             --bilateral hip pain with attempts at rolling.              --on Baclofen  20 mg qid, Valium  5 mg qid with Zanaflex  4 mg qid prn for spasticity.  -12/27 Started Duloxetine   20 mg at bedtime 12/25- titrate up every 3-4 days as tolerated  -bumped baclofen  to 25mg  qid, consider scheduling tizanidine   -added gabapentin  100mg  tid for injury level/chest wall pain/spasticity 12/29- pushing antispasmodics given the severity of his spasticity. Continue to titrate neuro-sedation permitting -increased baclofen  to 30mg  qid 12/28 -continue valium , consider scheduled tizanidine  also -increased duloxetine  (now in am) to 30mg  -pt refuses to take gabapentin  as he doesn't want more potentially sedating medication--I stopped it yesterday 12/30- will add Dantrolene  50 mg at bedtime- explained it's gradual onset- will increase q5 days 12/31- will stop Dantrolene - so sleepy this AM- calling Dr Carollee to see what he thinks about placing ITB pump sooner than later? 1/1- educated fiance and pt that cannot do ITB pump due to lack of flow of CSF- p[er [facility]- reason couldn't get LP for CT myelogram Will wait for now on Keppra , but if sedation gets better, can try 1/2- sinc ehaving confusion, will stop Zanaflex - in rare cases, can cause hallucinations (I've sene in other patients) and will give more baclofen  prn 10 mg TID prn 1/3- tone if anything looking worse- don't know what else can do right now except Keppra , but will need to reduce Valium , or something else to try the Keppra  1/4:  discussed with patient, wish to wait until primary team is back on Monday to make major medication adjustments/add Keppra - 1/5: tone remains severe, again patient wishes major medication adjustments per primary team, but could likely retry Zanaflex  given confusion has not improved 1/6- will add keppra  250 mg BID and reduce MS Contin  to 1x/day in evening. To reduce sedation during day.  1/7- pain  more in AM yesterday, but did better overnight- let's see if Keppra  helps or hurts?will d/w SCI docs to see what opther options there might be 1/8- Oxy was what made pt confused yesterday per wife- wil sop and change to Norco- less chance of confusion 4. Mood/Behavior/Sleep: LCSW to follow for evaluation and support.              -antipsychotic agents: N/A  -12/27--trazodone  and melatonin helpful 12/28 doing better with earlier scheduling of trazodone  and melatonin 1/2- having some confusion at night- not sure if due to Zanaflex , which I sotpped, but will give Seroquel  25 mg at bedtime to help as well -d/c trazodone  1/3- will increase Seorquel to 50 mg at bedtime- took clothes off last night- got confused, but calmer, per fiance'.  1/4: Increase seroquel  to 75 mg nightly, and retimed to 8 PM 1-5: DC melatonin due to vivid dreams.  No active hallucinations last night, so continue current dose of Seroquel ..  Patient endorsed passive SI yesterday, resolved today, he is agreeable to immediately notifying physician and staff if this recurs--psychiatry not consulted due to patient being no active risk of self-harm and with recent start of duloxetine  1/6- will increase Duloxetine  to 60 mg at bedtime- since sedated during day, to reduce daytime sedation.  5. Neuropsych/cognition: This patient is? capable of making decisions on his own behalf. 6. Skin/Wound Care: Routine pressure relief measures.   -has developed left heel sore d/t tone, struggles to fit in [facility]  -heel dressed, ordered prevalon boots so that he's better able to tolerate wear-- -12/29 prevalon boots in place this am--tolerating -1/4: right heel ?DTI on exam--more consistent with skin color variation.  Other wounds appear improving/stable.  7. Hyponatremia: continue to monitor Na weekly  12/30- Na 139  8. Klebsiella aerogenes bacteremia/sepsis /UTI: levaquin   -abx completed per ID  recs--7 days  9. Neurogenic bowel: Continue Senna S 2  tabs BID. Miralax  daily-->has been incontinent of bowel and question due to antibiotics and/or laxatives --Suppository daily after supper-->has refused for past 3 nights.  --would prefer enema three times a week as at home. Have suggested using mini enema 1/7- LBM last night with Enemeze 1/8- refused bowel program tonight- wants a break 10. Neurogenic bladder: Foley was replaced on acute-->will d/c and start bladder program. (patient would like to resume) --Monitor voiding with PVR checks             --use coude cath and monitor for recurrent hematuria             --continue flomax \  12/29 cath volumes generally in range until this morning--continue current schedule  12/31- Cath volumes looking good- cathing q4 hours  1/1- per fiance' Draining slow/caths- will speak to nursing- on max dose of Flomax  to try and get pt to void.   1/2- Pt's cathing is doing well- went 1 time 5 hours 45 minutes last night- but fiance concerned they are waiting for her to do- will need help with this.   1/3-5 cath volumes going well --continue cathing  1/6- will change caths to q4 hours scheduled to reduce spasticity  1/7- fiance doing most of caths 11. Neuropathy/at level SCI pain: Was on Cymbalta  PTA-resumed at 20 mg at bedtime, continue--increased to 30mg  daily effective 12/28  12/30- doesn't want Cymbalta  for pain, but willing to take for mood  1-5: See #4 above  1/6- will increase Duloxetine  to 60 mg at bedtime for pain/mood.  12. Abd pain/gas/at level SCI pain  1/6- will add Gasx 160 mg QID and also Keppra  250 mg BID   1/7- No major changes today- will monitor closely   I spent a total of 35    minutes on total care today- >50% coordination of care- due to  D/w other SCI physiatrists, went over pain meds- changed it up and d/w nursing esp about bowel program this AM  LOS: 15 days A FACE TO FACE EVALUATION WAS PERFORMED  Tylie Golonka 07/03/2023, 7:39 PM

## 2023-07-04 DIAGNOSIS — G959 Disease of spinal cord, unspecified: Secondary | ICD-10-CM | POA: Diagnosis not present

## 2023-07-04 LAB — CBC
HCT: 33.9 % — ABNORMAL LOW (ref 39.0–52.0)
Hemoglobin: 11.7 g/dL — ABNORMAL LOW (ref 13.0–17.0)
MCH: 33.4 pg (ref 26.0–34.0)
MCHC: 34.5 g/dL (ref 30.0–36.0)
MCV: 96.9 fL (ref 80.0–100.0)
Platelets: 293 10*3/uL (ref 150–400)
RBC: 3.5 MIL/uL — ABNORMAL LOW (ref 4.22–5.81)
RDW: 13 % (ref 11.5–15.5)
WBC: 6 10*3/uL (ref 4.0–10.5)
nRBC: 0 % (ref 0.0–0.2)

## 2023-07-04 LAB — BASIC METABOLIC PANEL
Anion gap: 8 (ref 5–15)
BUN: 11 mg/dL (ref 6–20)
CO2: 28 mmol/L (ref 22–32)
Calcium: 9.3 mg/dL (ref 8.9–10.3)
Chloride: 103 mmol/L (ref 98–111)
Creatinine, Ser: 0.73 mg/dL (ref 0.61–1.24)
GFR, Estimated: >60 mL/min (ref >60)
Glucose, Bld: 102 mg/dL — ABNORMAL HIGH (ref 70–99)
Potassium: 4 mmol/L (ref 3.5–5.1)
Sodium: 139 mmol/L (ref 135–145)

## 2023-07-04 MED ORDER — QUETIAPINE FUMARATE 50 MG PO TABS: 75.0000 mg | ORAL_TABLET | Freq: Every evening | ORAL | Status: DC | PRN

## 2023-07-04 NOTE — Progress Notes (Signed)
 Occupational Therapy Session Note  Patient Details  Name: Leon Rodriguez MRN: [id number] Date of Birth: Nov 03, 1965  Today's Date: 07/04/2023 OT Individual Time: 0930-1030 OT Individual Time Calculation (min): 60 min    Short Term Goals: Week 4:  OT Short Term Goal 1 (Week 4): Pt will perform TB transfers with mod A+1 on a level surface OT Short Term Goal 2 (Week 4): Pt will perform LB dressing with max A at bed level OT Short Term Goal 3 (Week 4): Pt will complete UB dressing with min A seated EOB or in w/c  Skilled Therapeutic Interventions/Progress Updates:    Pt in bed upon arrival with LLE in flexed position with hip flexed. Pt reports he had a terrible night. Pt with ongoing spasms during session. Pt able to initiate rolling R/L in bed but required max A to complete task. Dependent for donning pants. Later in session, pt continued to doze off and required mod verbal cues to arouse. Pt unable to attend to task of follow commands and participate in therapy. Gentle stretching and PROM provided throughout session for spasm control/relief. Pt remained in bed with all needs within reach. Bed alarm activated. Pt missed 15 mins skilled OT serices.   Therapy Documentation Precautions:  Precautions Precautions: Fall, Cervical Precaution Comments: hx of many falls, severe B LE hypertonia with muscle spasms, verbally went through cervical precautions throughout session Required Braces or Orthoses: Cervical Brace Cervical Brace: Hard collar, Other (comment) (OOB) Restrictions Weight Bearing Restrictions Per Provider Order: No Other Position/Activity Restrictions: cervical precautions General: General OT Amount of Missed Time: 15 Minutes Pain:  Pt with consistent trunk and BLE spasms during session; gentle stretching ADL: ADL Eating: Set up Grooming: Setup Upper Body Bathing: Moderate assistance Where Assessed-Upper Body Bathing: Bed level Where Assessed-Lower Body Bathing: Bed  level Upper Body Dressing: Minimal assistance Where Assessed-Upper Body Dressing: Edge of bed Lower Body Dressing: Dependent Where Assessed-Lower Body Dressing: Bed level Toileting: Dependent Toilet Transfer Method: Not assessed (will need sara stedy lift) Vision   Perception    Praxis   Balance   Exercises:   Other Treatments:     Therapy/Group: Individual Therapy  Maritza Debby Mare 07/04/2023, 10:46 AM

## 2023-07-04 NOTE — Progress Notes (Signed)
 Pt again refused bowel program. Nurse attempted to educate patient, but pt appeared to be unreceptive. This nurse explained the consequences of refusing the bowel program. Pt verbalizes understanding.    Rito Ehrlich, LPN

## 2023-07-04 NOTE — Progress Notes (Signed)
 Occupational Therapy Weekly Progress Note  Patient Details  Name: Leon Rodriguez MRN: [id number] Date of Birth: [birth year]  Beginning of progress report period: June 27, 2023 End of progress report period: July 04, 2023  Patient has met 2 of 3 short term goals.  Pt is making slow progress with BADLs and functional transfers and continues to be hindered by increased tone with sporadic LB spasms. Rolling in bed using bed rails with mod A to facilitate LB dressing and toileting tasks at bed level. Supine>sit EOB with mod A. Sitting balance EOB with min A except when experiencing spasms. Pt has been able to stand in [place] with max A+2 but typically use TB for transfers with mod A +1 on level surface. Currenlty dependent for LB dressng and mod A for UB dressing tasks. SO has been present during therapy session. Education ongoing. Initiated conversation regarding use of manual hoyer lift at home.   Patient continues to demonstrate the following deficits: muscle weakness and muscle joint tightness, decreased cardiorespiratoy endurance, impaired timing and sequencing, abnormal tone, unbalanced muscle activation, and decreased coordination, decreased attention, decreased awareness, decreased problem solving, decreased safety awareness, decreased memory, and delayed processing, and decreased sitting balance, decreased standing balance, decreased postural control, decreased balance strategies, and difficulty maintaining precautions and therefore will continue to benefit from skilled OT intervention to enhance overall performance with ADL and Reduce care partner burden.  Patient progressing toward long term goals..  Continue plan of care.  OT Short Term Goals Week 3:  OT Short Term Goal 1 (Week 3): Pt will be able to don a shirt with min A sitting EOB with no back support. OT Short Term Goal 2 (Week 3): Pt will be able wash LE from bed level with mod A. OT Short Term Goal 2 - Progress (Week 3): Met OT  Short Term Goal 3 (Week 3): pt will be able to rise to stand in a standard stedy lift with max A of 2. OT Short Term Goal 3 - Progress (Week 3): Met OT Short Term Goal 4 (Week 3): Pt will perform TB transfers with mod A+1 on a level surface OT Short Term Goal 4 - Progress (Week 3): Progressing toward goal Week 4:  OT Short Term Goal 1 (Week 4): Pt will perform TB transfers with mod A+1 on a level surface OT Short Term Goal 2 (Week 4): Pt will perform LB dressing with max A at bed level OT Short Term Goal 3 (Week 4): Pt will complete UB dressing with min A seated EOB or in w/c    Maritza Debby Mare 07/04/2023, 6:52 AM

## 2023-07-04 NOTE — Progress Notes (Signed)
 Physical Therapy Weekly Progress Note  Patient Details  Name: Leon Rodriguez MRN: [id number] Date of Birth: 06/28/1965  Beginning of progress report period: June 27, 2023 End of progress report period: July 04, 2023    Patient has met 1 of 3 short term goals.  Pt is making slow but steady progress with functional transfers. He is limited by frequent drowsiness and low arousal and extreme spasticity/tone. He is able to perform slideboard transfers with mod-max but requires +2 present for safety. He tolerates the standing frame well which provides moderate spasticity management, but has not been safe to attempt gait training at this time.   Patient continues to demonstrate the following deficits muscle weakness and muscle joint tightness, decreased cardiorespiratoy endurance, impaired timing and sequencing, abnormal tone, ataxia, and decreased coordination, and decreased sitting balance, decreased standing balance, decreased postural control, decreased balance strategies, and difficulty maintaining precautions and therefore will continue to benefit from skilled PT intervention to increase functional independence with mobility.  Patient progressing toward long term goals..  Continue plan of care.  PT Short Term Goals Week 3:  PT Short Term Goal 1 (Week 3): Pt will verbalize pressure relief with cueing PT Short Term Goal 2 (Week 3): Pt will perform SBT with assist of 1 PT Short Term Goal 3 (Week 3): Pt will initiate care direction with transfers.  Skilled Therapeutic Interventions/Progress Updates:  Ambulation/gait training;Community reintegration;DME/adaptive equipment instruction;Neuromuscular re-education;Stair training;Psychosocial support;UE/LE Strength taining/ROM;Wheelchair propulsion/positioning;Balance/vestibular training;Discharge planning;Functional electrical stimulation;Pain management;Skin care/wound management;Therapeutic Activities;UE/LE Coordination activities;Cognitive  remediation/compensation;Disease management/prevention;Splinting/orthotics;Functional mobility training;Patient/family education;Therapeutic Exercise;Visual/perceptual remediation/compensation   Therapy Documentation Precautions:  Precautions Precautions: Fall, Cervical Precaution Comments: hx of many falls, severe B LE hypertonia with muscle spasms, verbally went through cervical precautions throughout session Required Braces or Orthoses: Cervical Brace Cervical Brace: Hard collar, Other (comment) (OOB) Restrictions Weight Bearing Restrictions Per Provider Order: No Other Position/Activity Restrictions: cervical precautions General:      Therapy/Group: Individual Therapy  Leon Rodriguez 07/04/2023, 5:11 PM

## 2023-07-04 NOTE — Progress Notes (Signed)
 PROGRESS NOTE   Subjective/Complaints:  Pt alone in room this AM- sleepy- not eating breakfast.   Said R foot working more for him this AM.    ROS:  + spasms - ongoing- bothersome, very limiting +confusion/sundowning-due to Oxy it appears   Pt denies SOB, abd pain, CP, N/V/C/D, and vision changes   Negative except for HPI  Objective:   No results found. Recent Labs    07/04/23 0642  WBC 6.0  HGB 11.7*  HCT 33.9*  PLT 293     Recent Labs    07/04/23 0642  NA 139  K 4.0  CL 103  CO2 28  GLUCOSE 102*  BUN 11  CREATININE 0.73  CALCIUM 9.3      Intake/Output Summary (Last 24 hours) at 07/04/2023 1220 Last data filed at 07/04/2023 0857 Gross per 24 hour  Intake 350 ml  Output 1750 ml  Net -1400 ml        Physical Exam: Vital Signs Blood pressure 113/75, pulse 96, temperature 97.6 F (36.4 C), resp. rate 19, height 5' 9 (1.753 m), weight 92 kg, SpO2 100%.     General: sleepy- answered questions and offered info about RLE; sitting up slightly in bed; no fiance' at bedside; NAD HENT: conjugate gaze; oropharynx moist CV: regular rate and rhythm; no JVD Pulmonary: CTA B/L; no W/R/R- good air movement GI: soft, NT; a little distended vs protuberant; mildly hypoactive Psychiatric: more sleepy Neurological: very sleepy, much more than normal this AM- it appeared hadn't really woekn up yet.   Ext: no clubbing, cyanosis, or edema Skin- healing blister on top of L foot-stable -Area of discoloration darkened skin on right heel, does not appear to be DTI  Neurological: Awake, alert, oriented x 3.  Follows all simple commands.  1-5: Left lower extremity MAS 2 knee extensors, MAS 1 plantar flexors, MAS 2 adductors Right lower extremity MAS 3-4 knee extensors, MAS 3 plantarflexors, MAS 3 adductors  Strength: Moving bilateral upper extremities antigravity against resistance; bilateral lower extremities  not quite antigravity in hip flexion Sensation: Altered below T4  Prior:  Comments: Ue's 5-/5 throughout on exam B/L RLE- HF 2-/5; KE 2-/5; DF 4-/5; and PF 4-/5 LLE- HF 2-/5; KE 2-/5; DF 2/5; PF 2/5- spasticity still present Sensory level at T4 and below-      Assessment/Plan: 1. Functional deficits which require 3+ hours per day of interdisciplinary therapy in a comprehensive inpatient rehab setting. Physiatrist is providing close team supervision and 24 hour management of active medical problems listed below. Physiatrist and rehab team continue to assess barriers to discharge/monitor patient progress toward functional and medical goals  Care Tool:  Bathing    Body parts bathed by patient: Face   Body parts bathed by helper: Right arm, Left arm, Chest, Abdomen, Front perineal area, Buttocks, Right upper leg, Left upper leg, Right lower leg, Left lower leg (wife assisted pt with bath prior to OT evaluation)     Bathing assist Assist Level: Total Assistance - Patient < 25%     Upper Body Dressing/Undressing Upper body dressing   What is the patient wearing?: Pull over shirt    Upper body assist  Assist Level: Minimal Assistance - Patient > 75%    Lower Body Dressing/Undressing Lower body dressing      What is the patient wearing?: Pants, Incontinence brief     Lower body assist Assist for lower body dressing: Total Assistance - Patient < 25%     Toileting Toileting    Toileting assist Assist for toileting: 2 Helpers     Transfers Chair/bed transfer  Transfers assist     Chair/bed transfer assist level: 2 Helpers     Locomotion Ambulation   Ambulation assist   Ambulation activity did not occur: Safety/medical concerns (limited by extreme tone)          Walk 10 feet activity   Assist  Walk 10 feet activity did not occur: Safety/medical concerns        Walk 50 feet activity   Assist Walk 50 feet with 2 turns activity did not occur:  Safety/medical concerns         Walk 150 feet activity   Assist Walk 150 feet activity did not occur: Safety/medical concerns         Walk 10 feet on uneven surface  activity   Assist Walk 10 feet on uneven surfaces activity did not occur: Safety/medical concerns         Wheelchair     Assist Is the patient using a wheelchair?: Yes Type of Wheelchair: Manual Wheelchair activity did not occur: Safety/medical concerns  Wheelchair assist level: Dependent - Patient 0% Max wheelchair distance: 150    Wheelchair 50 feet with 2 turns activity    Assist    Wheelchair 50 feet with 2 turns activity did not occur: Safety/medical concerns   Assist Level: Dependent - Patient 0%   Wheelchair 150 feet activity     Assist  Wheelchair 150 feet activity did not occur: Safety/medical concerns   Assist Level: Dependent - Patient 0%   Blood pressure 113/75, pulse 96, temperature 97.6 F (36.4 C), resp. rate 19, height 5' 9 (1.753 m), weight 92 kg, SpO2 100%.  Medical Problem List and Plan: 1. Functional deficits secondary to Cervical myelopathy s/p ACDF- back after sepsis             -patient may  shower             -ELOS/Goals: 3-4 weeks- min A hopefully- likely mod to max A Grounds pass ordered  D/c 07/17/23 -have counseled pt/wife about why CT myelo wasn't performed. Discussed spasticity.  D/w pt and fiance that [facility] doesn't think he could get ITB pump due to lack of flow of CSF at distal end of Spinal cord- where he needs it most/due to more spasticity in legs than arms Con't CIR PT and OT Other physicians I spoke to suggest restrying Dantrolene - they don't usually see if cause a lot of sedation. Since so helpful in many cases, will d/w pt/fiance' again.  Reinforced he is NOT a candidate for ITB pump Discussed his case with [facility]- had an idea about SDR- usually used in CP patients, but thought might help his spasticity 2.  Antithrombotics: -DVT/anticoagulation:   Pharmaceutical: Lovenox              -antiplatelet therapy: N/A 3. Pain Management/spasticity:  MS contin  15 mg bid, Oxycodone  prn. Lidoderm  patch for local measures. Tylenol  650 mg qid.             --bilateral hip pain with attempts at rolling.              --  on Baclofen  20 mg qid, Valium  5 mg qid with Zanaflex  4 mg qid prn for spasticity.  -12/27 Started Duloxetine  20 mg at bedtime 12/25- titrate up every 3-4 days as tolerated  -bumped baclofen  to 25mg  qid, consider scheduling tizanidine   -added gabapentin  100mg  tid for injury level/chest wall pain/spasticity 12/29- pushing antispasmodics given the severity of his spasticity. Continue to titrate neuro-sedation permitting -increased baclofen  to 30mg  qid 12/28 -continue valium , consider scheduled tizanidine  also -increased duloxetine  (now in am) to 30mg  -pt refuses to take gabapentin  as he doesn't want more potentially sedating medication--I stopped it yesterday 12/30- will add Dantrolene  50 mg at bedtime- explained it's gradual onset- will increase q5 days 12/31- will stop Dantrolene - so sleepy this AM- calling Dr Carollee to see what he thinks about placing ITB pump sooner than later? 1/1- educated fiance and pt that cannot do ITB pump due to lack of flow of CSF- p[er [facility]- reason couldn't get LP for CT myelogram Will wait for now on Keppra , but if sedation gets better, can try 1/2- sinc ehaving confusion, will stop Zanaflex - in rare cases, can cause hallucinations (I've sene in other patients) and will give more baclofen  prn 10 mg TID prn 1/3- tone if anything looking worse- don't know what else can do right now except Keppra , but will need to reduce Valium , or something else to try the Keppra  1/4:  discussed with patient, wish to wait until primary team is back on Monday to make major medication adjustments/add Keppra - 1/5: tone remains severe, again patient wishes major medication adjustments per primary team, but could likely retry Zanaflex   given confusion has not improved 1/6- will add keppra  250 mg BID and reduce MS Contin  to 1x/day in evening. To reduce sedation during day.  1/7- pain more in AM yesterday, but did better overnight- let's see if Keppra  helps or hurts?will d/w SCI docs to see what opther options there might be 1/8- Oxy was what made pt confused yesterday per wife- wil sop and change to Norco- less chance of confusion 1/9- very sleepy this AM- will d/w nursing why more today? 4. Mood/Behavior/Sleep: LCSW to follow for evaluation and support.              -antipsychotic agents: N/A  -12/27--trazodone  and melatonin helpful 12/28 doing better with earlier scheduling of trazodone  and melatonin 1/2- having some confusion at night- not sure if due to Zanaflex , which I sotpped, but will give Seroquel  25 mg at bedtime to help as well -d/c trazodone  1/3- will increase Seorquel to 50 mg at bedtime- took clothes off last night- got confused, but calmer, per fiance'.  1/4: Increase seroquel  to 75 mg nightly, and retimed to 8 PM 1-5: DC melatonin due to vivid dreams.  No active hallucinations last night, so continue current dose of Seroquel ..  Patient endorsed passive SI yesterday, resolved today, he is agreeable to immediately notifying physician and staff if this recurs--psychiatry not consulted due to patient being no active risk of self-harm and with recent start of duloxetine  1/6- will increase Duloxetine  to 60 mg at bedtime- since sedated during day, to reduce daytime sedation 1/9- will make seroquel  75 mg at bedtime PRN and see if that helps am sedation- I'm also wondering if increase in Duloxetine  making him sleepier- will check on him again today.  5. Neuropsych/cognition: This patient is? capable of making decisions on his own behalf. 6. Skin/Wound Care: Routine pressure relief measures.   -has developed left heel sore d/t tone, struggles to  fit in [facility]  -heel dressed, ordered prevalon boots so that he's better able to  tolerate wear-- -12/29 prevalon boots in place this am--tolerating -1/4: right heel ?DTI on exam--more consistent with skin color variation.  Other wounds appear improving/stable.  7. Hyponatremia: continue to monitor Na weekly  12/30- Na 139  8. Klebsiella aerogenes bacteremia/sepsis /UTI: levaquin   -abx completed per ID recs--7 days  9. Neurogenic bowel: Continue Senna S 2 tabs BID. Miralax  daily-->has been incontinent of bowel and question due to antibiotics and/or laxatives --Suppository daily after supper-->has refused for past 3 nights.  --would prefer enema three times a week as at home. Have suggested using mini enema 1/7- LBM last night with Enemeze 1/8- refused bowel program tonight- wants a break 1/9- Refused bowel program again last night- concerned his constipation will get him more spastic 10. Neurogenic bladder: Foley was replaced on acute-->will d/c and start bladder program. (patient would like to resume) --Monitor voiding with PVR checks             --use coude cath and monitor for recurrent hematuria             --continue flomax \  12/29 cath volumes generally in range until this morning--continue current schedule  12/31- Cath volumes looking good- cathing q4 hours  1/1- per fiance' Draining slow/caths- will speak to nursing- on max dose of Flomax  to try and get pt to void.   1/2- Pt's cathing is doing well- went 1 time 5 hours 45 minutes last night- but fiance concerned they are waiting for her to do- will need help with this.   1/3-5 cath volumes going well --continue cathing  1/6- will change caths to q4 hours scheduled to reduce spasticity  1/7- fiance doing most of caths 11. Neuropathy/at level SCI pain: Was on Cymbalta  PTA-resumed at 20 mg at bedtime, continue--increased to 30mg  daily effective 12/28  12/30- doesn't want Cymbalta  for pain, but willing to take for mood  1-5: See #4 above  1/6- will increase Duloxetine  to 60 mg at bedtime for pain/mood.  12. Abd  pain/gas/at level SCI pain  1/6- will add Gasx 160 mg QID and also Keppra  250 mg BID   1/7- No major changes today- will monitor closely   I spent a total of 36   minutes on total care today- >50% coordination of care- due to  Checked on pt 2x- d/w nursing about bowel program- and made changes- also contacted Dr Dawley about SDR?    LOS: 16 days A FACE TO FACE EVALUATION WAS PERFORMED  Leon Rodriguez 07/04/2023, 12:20 PM

## 2023-07-04 NOTE — Progress Notes (Signed)
 Physical Therapy Session Note  Patient Details  Name: Leon CREEDEN MRN: [id number] Date of Birth: 1966-04-01  Today's Date: 07/04/2023 PT Individual Time: 1405-1440 PT Individual Time Calculation (min): 35 min   Short Term Goals: Week 2:  PT Short Term Goal 1 (Week 2): pt will perform SBT with mod a consistently PT Short Term Goal 2 (Week 2): Pt will tolerate standing with assist or standing frame x 3 min PT Short Term Goal 3 (Week 2): Pt will verbalize pressure relief frequency and duration  Skilled Therapeutic Interventions/Progress Updates: Pt presented in TIS with brother present agreeable to therapy. Pt very lethargic throughout session. Pt transported to day room for increased stimuli and attempted use of Cybex Kinetron for reciprocal activity and NMR via forced use. Pt was able to perform small range with RLE however initially required total A for LLE. After several cycles PTA noted slight activation in LLE and pt was able to initiate push and move in VERY small range. While performing this activity primary therapist arrived with [facility] w/c. Pt agreeable to trial [place] as anticipated staying in TIS after session. PTA left of obtain w/c cushion and upon arrival pt stating feels urge to use bathroom. Focus then changed to return to room and transfer to bed. Pt required total A for Slide board set up and with significantly increased time due to spasms pt completed Slide board transfer with maxA x 1 with +2 present for safety. Pt was able to complete sit to supine with heavy modA. Pt required +2 assist for boost to [facility]. Pt repositioned to comfort and left in bed with bed alarm on, call bell within reach and needs met.      Therapy Documentation Precautions:  Precautions Precautions: Fall, Cervical Precaution Comments: hx of many falls, severe B LE hypertonia with muscle spasms, verbally went through cervical precautions throughout session Required Braces or Orthoses: Cervical  Brace Cervical Brace: Hard collar, Other (comment) (OOB) Restrictions Weight Bearing Restrictions Per Provider Order: No Other Position/Activity Restrictions: cervical precautions General:   Vital Signs: Therapy Vitals Temp: 97.8 F (36.6 C) Pulse Rate: 84 Resp: 18 BP: 99/83 Patient Position (if appropriate): Lying Oxygen Therapy SpO2: 100 % O2 Device: Room Air   Therapy/Group: Individual Therapy  Veola Cafaro 07/04/2023, 3:47 PM

## 2023-07-04 NOTE — Progress Notes (Addendum)
 IP Rehab Bowel Program Documentation   Bowel Program Start time 2000  Dig Stim Indicated? Yes  Dig Stim Prior to Suppository or mini Enema X 2   Output from dig stim: Moderate  Ordered intervention: Suppository no, mini enema yes,   Repeat dig stim after Suppository or Mini enema  X 1,  Output? Large   Bowel Program Complete? Yes  handoff given no  Patient Tolerated? Yes

## 2023-07-04 NOTE — Progress Notes (Signed)
 Physical Therapy Session Note  Patient Details  Name: Leon Rodriguez MRN: [id number] Date of Birth: [birth year]  Today's Date: 07/04/2023 PT Individual Time: 1115-1200 PT Individual Time Calculation (min): 45 min   Short Term Goals: Week 2:  PT Short Term Goal 1 (Week 2): pt will perform SBT with mod a consistently PT Short Term Goal 2 (Week 2): Pt will tolerate standing with assist or standing frame x 3 min PT Short Term Goal 3 (Week 2): Pt will verbalize pressure relief frequency and duration  Skilled Therapeutic Interventions/Progress Updates:    Pt recd sitting EOB with both bed rails up and no staff or family present in the room. Addressed safety concerns, pt states he was In agony lying in bed and needed to sit up. Pt reports pain controlled once sitting up, no further intervention required. Pt required increased cueing for safety and transfer set up. Pt performed slideboard transfer with mod-max and increased cueing to slow down and lean forward for safe transfer. By end of session, pt notably more alert and responsive than start of session. Pt transported to therapy gym for time management and energy conservation. Session focused on NMR using clothespins of all colors/resistance, reaching in various directions. Pt cued to sit forward in chair and maintain upright without backrest, which required UUE support with occasional ability to maintain without UE support for brief bouts. Pt performed lateral, anterior, and reaching toward feet/floor in this manner. Pt also participated in beach ball volleys with 3lb dowel for endurance, arousal, and UE strength x ~3 minutes. Pt returned to room and remained in TIS, was left with all needs in reach and alarm active.     Therapy Documentation Precautions:  Precautions Precautions: Fall, Cervical Precaution Comments: hx of many falls, severe B LE hypertonia with muscle spasms, verbally went through cervical precautions throughout session Required  Braces or Orthoses: Cervical Brace Cervical Brace: Hard collar, Other (comment) (OOB) Restrictions Weight Bearing Restrictions Per Provider Order: No Other Position/Activity Restrictions: cervical precautions General:       Therapy/Group: Individual Therapy  [name] 07/04/2023, 12:38 PM

## 2023-07-05 ENCOUNTER — Inpatient Hospital Stay (HOSPITAL_COMMUNITY): Payer: Medicaid Other

## 2023-07-05 DIAGNOSIS — G959 Disease of spinal cord, unspecified: Secondary | ICD-10-CM | POA: Diagnosis not present

## 2023-07-05 MED ORDER — DANTROLENE SODIUM 25 MG PO CAPS
25.0000 mg | ORAL_CAPSULE | Freq: Every day | ORAL | Status: DC
  Administered 2023-07-05 – 2023-07-07 (×3): 25 mg via ORAL
  Filled 2023-07-05 (×4): qty 1

## 2023-07-05 MED ORDER — LORAZEPAM 0.5 MG PO TABS
1.0000 mg | ORAL_TABLET | ORAL | Status: AC
  Administered 2023-07-05: 1 mg via ORAL
  Filled 2023-07-05: qty 2

## 2023-07-05 MED ORDER — QUETIAPINE FUMARATE 50 MG PO TABS
75.0000 mg | ORAL_TABLET | Freq: Every day | ORAL | Status: DC
  Administered 2023-07-05 – 2023-07-11 (×7): 75 mg via ORAL
  Filled 2023-07-05 (×6): qty 1

## 2023-07-05 NOTE — Progress Notes (Signed)
 MRI called stating patient was uncomfortable and in pain while on MRI stretcher. Primary nurse took pain med and anxiety meds to MRI suite. Per primary nurse patient refused the medications and wants to return to unit.  Pam PA made aware.    Geralynn LELON Earl, RN

## 2023-07-05 NOTE — Progress Notes (Signed)
 Occupational Therapy Session Note  Patient Details  Name: [name] MRN: [id number] Date of Birth: [birth year]  Today's Date: 07/05/2023 OT Individual Time: 0930-1030 OT Individual Time Calculation (min): 60 min    Short Term Goals: Week 4:  OT Short Term Goal 1 (Week 4): Pt will perform TB transfers with mod A+1 on a level surface OT Short Term Goal 2 (Week 4): Pt will perform LB dressing with max A at bed level OT Short Term Goal 3 (Week 4): Pt will complete UB dressing with min A seated EOB or in w/c  Skilled Therapeutic Interventions/Progress Updates:    Pt resting in bed upon arrival with SO present. See below for pain. Initial focus on BLE stretchiing/MFR to pain mgmt and positioning. Pt dependent for donning pants at bed level. Rolliing R/L in bed with mod A using bed rails. Max verbal cues for task initiation. Supine>sit EOB with max A this morning. Sitting balance with min A in preparation for TB transfer to [facility] w/c. TB transfer with mod A and OTA managing BLE position during transfers. Pt able to assist with repositioning in w/c. Pt able to propel w/c out room and return to room. SO voiced concern that they have small cars with questions about transport. Informed pt and SO that an ATP would be complete a w/c evaluatoin in conjunction with therapy to assist in acquiring the appropriate option. Pt remained in [place] w/c with SO present.   Therapy Documentation Precautions:  Precautions Precautions: Fall, Cervical Precaution Comments: hx of many falls, severe B LE hypertonia with muscle spasms, verbally went through cervical precautions throughout session Required Braces or Orthoses: Cervical Brace Cervical Brace: Hard collar, Other (comment) (OOB) Restrictions Weight Bearing Restrictions Per Provider Order: No Other Position/Activity Restrictions: cervical precautions    Pain:  Pt reports pain is bad and states that diuring the night he was so stiff he couldn't  move; stretching and repositioning with minimal relief   Therapy/Group: Individual Therapy  Maritza Debby Mare 07/05/2023, 12:08 PM

## 2023-07-05 NOTE — Progress Notes (Signed)
 PROGRESS NOTE   Subjective/Complaints:  Will restart Seroquel  scheduled at bedtime since pt got confused and took off his clothes again last night.   Also d/w fiance and pt about thoracic MRI and selective dorsal rhizotomy.  Will restart Dantrolene  25 mg at bedtime- and d/c Keppra  since not working.   Pt attempted to get MRI- in too much pain/spasms-   ROS:  + spasms - ongoing- bothersome, very limiting +confusion/sundowning-due to Oxy it appears- did it again last night-    Pt denies SOB, abd pain, CP, N/V/C/D, and vision changes  Negative except for HPI  Objective:   No results found. Recent Labs    07/04/23 0642  WBC 6.0  HGB 11.7*  HCT 33.9*  PLT 293     Recent Labs    07/04/23 0642  NA 139  K 4.0  CL 103  CO2 28  GLUCOSE 102*  BUN 11  CREATININE 0.73  CALCIUM 9.3      Intake/Output Summary (Last 24 hours) at 07/05/2023 1432 Last data filed at 07/05/2023 1300 Gross per 24 hour  Intake 240 ml  Output 1900 ml  Net -1660 ml        Physical Exam: Vital Signs Blood pressure 123/86, pulse 91, temperature 97.6 F (36.4 C), resp. rate 19, height 5' 9 (1.753 m), weight 89.5 kg, SpO2 100%.      General: awake, alert, appropriate, fiance' at bedside; pt sleepy, but more awake than last 2 days; NAD HENT: conjugate gaze; oropharynx moist CV: regular rate and rhythm; no JVD Pulmonary: CTA B/L; no W/R/R- good air movement GI: soft, NT, ND, (+)BS Psychiatric: flat- less sleepy Neurological: Ox3 Severe spasms still seen B/L  Ext: no clubbing, cyanosis, or edema Skin- healing blister on top of L foot-stable -Area of discoloration darkened skin on right heel, does not appear to be DTI  Neurological: Awake, alert, oriented x 3.  Follows all simple commands.  1-5: Left lower extremity MAS 2 knee extensors, MAS 1 plantar flexors, MAS 2 adductors Right lower extremity MAS 3-4 knee extensors, MAS 3  plantarflexors, MAS 3 adductors  Strength: Moving bilateral upper extremities antigravity against resistance; bilateral lower extremities not quite antigravity in hip flexion Sensation: Altered below T4  Prior:  Comments: Ue's 5-/5 throughout on exam B/L RLE- HF 2-/5; KE 2-/5; DF 4-/5; and PF 4-/5 LLE- HF 2-/5; KE 2-/5; DF 2/5; PF 2/5- spasticity still present Sensory level at T4 and below-      Assessment/Plan: 1. Functional deficits which require 3+ hours per day of interdisciplinary therapy in a comprehensive inpatient rehab setting. Physiatrist is providing close team supervision and 24 hour management of active medical problems listed below. Physiatrist and rehab team continue to assess barriers to discharge/monitor patient progress toward functional and medical goals  Care Tool:  Bathing    Body parts bathed by patient: Face   Body parts bathed by helper: Right arm, Left arm, Chest, Abdomen, Front perineal area, Buttocks, Right upper leg, Left upper leg, Right lower leg, Left lower leg (wife assisted pt with bath prior to OT evaluation)     Bathing assist Assist Level: Total Assistance - Patient < 25%  Upper Body Dressing/Undressing Upper body dressing   What is the patient wearing?: Pull over shirt    Upper body assist Assist Level: Minimal Assistance - Patient > 75%    Lower Body Dressing/Undressing Lower body dressing      What is the patient wearing?: Pants, Incontinence brief     Lower body assist Assist for lower body dressing: Total Assistance - Patient < 25%     Toileting Toileting    Toileting assist Assist for toileting: 2 Helpers     Transfers Chair/bed transfer  Transfers assist     Chair/bed transfer assist level: 2 Helpers     Locomotion Ambulation   Ambulation assist   Ambulation activity did not occur: Safety/medical concerns (limited by extreme tone)          Walk 10 feet activity   Assist  Walk 10 feet activity  did not occur: Safety/medical concerns        Walk 50 feet activity   Assist Walk 50 feet with 2 turns activity did not occur: Safety/medical concerns         Walk 150 feet activity   Assist Walk 150 feet activity did not occur: Safety/medical concerns         Walk 10 feet on uneven surface  activity   Assist Walk 10 feet on uneven surfaces activity did not occur: Safety/medical concerns         Wheelchair     Assist Is the patient using a wheelchair?: Yes Type of Wheelchair: Manual Wheelchair activity did not occur: Safety/medical concerns  Wheelchair assist level: Dependent - Patient 0% Max wheelchair distance: 150    Wheelchair 50 feet with 2 turns activity    Assist    Wheelchair 50 feet with 2 turns activity did not occur: Safety/medical concerns   Assist Level: Dependent - Patient 0%   Wheelchair 150 feet activity     Assist  Wheelchair 150 feet activity did not occur: Safety/medical concerns   Assist Level: Dependent - Patient 0%   Blood pressure 123/86, pulse 91, temperature 97.6 F (36.4 C), resp. rate 19, height 5' 9 (1.753 m), weight 89.5 kg, SpO2 100%.  Medical Problem List and Plan: 1. Functional deficits secondary to Cervical myelopathy s/p ACDF- back after sepsis             -patient may  shower             -ELOS/Goals: 3-4 weeks- min A hopefully- likely mod to max A Grounds pass ordered  D/c 07/17/23 -have counseled pt/wife about why CT myelo wasn't performed. Discussed spasticity.  D/w pt and fiance that [facility] doesn't think he could get ITB pump due to lack of flow of CSF at distal end of Spinal cord- where he needs it most/due to more spasticity in legs than arms Con't CIR PT and OT Other physicians I spoke to suggest restrying Dantrolene - they don't usually see if cause a lot of sedation. Since so helpful in many cases, will d/w pt/fiance' again.  Reinforced he is NOT a candidate for ITB pump D/W Dr Carollee and Dr  Vona at [facility] about possible SDR- as well as getting thoracic MRI to rul out another cause of spasticity being so bad.  2.  Antithrombotics: -DVT/anticoagulation:  Pharmaceutical: Lovenox              -antiplatelet therapy: N/A 3. Pain Management/spasticity:  MS contin  15 mg bid, Oxycodone  prn. Lidoderm  patch for local measures. Tylenol  650 mg qid.             --  bilateral hip pain with attempts at rolling.              --on Baclofen  20 mg qid, Valium  5 mg qid with Zanaflex  4 mg qid prn for spasticity.  -12/27 Started Duloxetine  20 mg at bedtime 12/25- titrate up every 3-4 days as tolerated  -bumped baclofen  to 25mg  qid, consider scheduling tizanidine   -added gabapentin  100mg  tid for injury level/chest wall pain/spasticity 12/29- pushing antispasmodics given the severity of his spasticity. Continue to titrate neuro-sedation permitting -increased baclofen  to 30mg  qid 12/28 -continue valium , consider scheduled tizanidine  also -increased duloxetine  (now in am) to 30mg  -pt refuses to take gabapentin  as he doesn't want more potentially sedating medication--I stopped it yesterday 12/30- will add Dantrolene  50 mg at bedtime- explained it's gradual onset- will increase q5 days 12/31- will stop Dantrolene - so sleepy this AM- calling Dr Carollee to see what he thinks about placing ITB pump sooner than later? 1/1- educated fiance and pt that cannot do ITB pump due to lack of flow of CSF- p[er [facility]- reason couldn't get LP for CT myelogram Will wait for now on Keppra , but if sedation gets better, can try 1/2- sinc ehaving confusion, will stop Zanaflex - in rare cases, can cause hallucinations (I've sene in other patients) and will give more baclofen  prn 10 mg TID prn 1/3- tone if anything looking worse- don't know what else can do right now except Keppra , but will need to reduce Valium , or something else to try the Keppra  1/4:  discussed with patient, wish to wait until primary team is back on Monday to make  major medication adjustments/add Keppra - 1/5: tone remains severe, again patient wishes major medication adjustments per primary team, but could likely retry Zanaflex  given confusion has not improved 1/6- will add keppra  250 mg BID and reduce MS Contin  to 1x/day in evening. To reduce sedation during day.  1/7- pain more in AM yesterday, but did better overnight- let's see if Keppra  helps or hurts?will d/w SCI docs to see what opther options there might be 1/8- Oxy was what made pt confused yesterday per wife- wil sop and change to Norco- less chance of confusion 1/9- very sleepy this AM- will d/w nursing why more today? 1/1- delay in getting pain meds today and Vlaium- d/w PA 4. Mood/Behavior/Sleep: LCSW to follow for evaluation and support.              -antipsychotic agents: N/A  -12/27--trazodone  and melatonin helpful 12/28 doing better with earlier scheduling of trazodone  and melatonin 1/2- having some confusion at night- not sure if due to Zanaflex , which I sotpped, but will give Seroquel  25 mg at bedtime to help as well -d/c trazodone  1/3- will increase Seorquel to 50 mg at bedtime- took clothes off last night- got confused, but calmer, per fiance'.  1/4: Increase seroquel  to 75 mg nightly, and retimed to 8 PM 1-5: DC melatonin due to vivid dreams.  No active hallucinations last night, so continue current dose of Seroquel ..  Patient endorsed passive SI yesterday, resolved today, he is agreeable to immediately notifying physician and staff if this recurs--psychiatry not consulted due to patient being no active risk of self-harm and with recent start of duloxetine  1/6- will increase Duloxetine  to 60 mg at bedtime- since sedated during day, to reduce daytime sedation 1/9- will make seroquel  75 mg at bedtime PRN and see if that helps am sedation- I'm also wondering if increase in Duloxetine  making him sleepier- will check on him again today.  5. Neuropsych/cognition: This patient is? capable of  making decisions on his own behalf. 6. Skin/Wound Care: Routine pressure relief measures.   -has developed left heel sore d/t tone, struggles to fit in [facility]  -heel dressed, ordered prevalon boots so that he's better able to tolerate wear-- -12/29 prevalon boots in place this am--tolerating -1/4: right heel ?DTI on exam--more consistent with skin color variation.  Other wounds appear improving/stable.  7. Hyponatremia: continue to monitor Na weekly  12/30- Na 139  8. Klebsiella aerogenes bacteremia/sepsis /UTI: levaquin   -abx completed per ID recs--7 days  9. Neurogenic bowel: Continue Senna S 2 tabs BID. Miralax  daily-->has been incontinent of bowel and question due to antibiotics and/or laxatives --Suppository daily after supper-->has refused for past 3 nights.  --would prefer enema three times a week as at home. Have suggested using mini enema 1/7- LBM last night with Enemeze 1/8- refused bowel program tonight- wants a break 1/9- Refused bowel program again last night- concerned his constipation will get him more spastic 1/11- fiance did bowel program last night 10. Neurogenic bladder: Foley was replaced on acute-->will d/c and start bladder program. (patient would like to resume) --Monitor voiding with PVR checks             --use coude cath and monitor for recurrent hematuria             --continue flomax \  12/29 cath volumes generally in range until this morning--continue current schedule  12/31- Cath volumes looking good- cathing q4 hours  1/1- per fiance' Draining slow/caths- will speak to nursing- on max dose of Flomax  to try and get pt to void.   1/2- Pt's cathing is doing well- went 1 time 5 hours 45 minutes last night- but fiance concerned they are waiting for her to do- will need help with this.   1/3-5 cath volumes going well --continue cathing  1/6- will change caths to q4 hours scheduled to reduce spasticity  1/7- fiance doing most of caths 11. Neuropathy/at level SCI  pain: Was on Cymbalta  PTA-resumed at 20 mg at bedtime, continue--increased to 30mg  daily effective 12/28  12/30- doesn't want Cymbalta  for pain, but willing to take for mood  1-5: See #4 above  1/6- will increase Duloxetine  to 60 mg at bedtime for pain/mood.  12. Abd pain/gas/at level SCI pain  1/6- will add Gasx 160 mg QID and also Keppra  250 mg BID   1/7- No major changes today- will monitor closely  1/10- will stop Keppra -no improvement   I spent a total of 56   minutes on total care today- >50% coordination of care- due to  D/w Dr Murray about MRI- d/w PA about trying to get MRI under sedation-    LOS: 17 days A FACE TO FACE EVALUATION WAS PERFORMED  Haidee Stogsdill 07/05/2023, 2:32 PM

## 2023-07-05 NOTE — Progress Notes (Signed)
 Patient ID: Leon Rodriguez, male   DOB: 31-Mar-1966, 58 y.o.   MRN: [id number]  SW sent demo sheet to [employer].   [name], MSW, LCSW Office: [phone] Cell: [phone] Fax: [phone]

## 2023-07-05 NOTE — Progress Notes (Signed)
 Occupational Therapy Session Note  Patient Details  Name: Leon Rodriguez MRN: [id number] Date of Birth: Mar 21, 1966  Today's Date: 07/05/2023 OT Individual Time: 1345-1400 OT Individual Time Calculation (min): 15 min  and Today's Date: 07/05/2023 OT Missed Time: 30 Minutes Missed Time Reason: Other (comment) (eating lunch)   Short Term Goals: Week 4:  OT Short Term Goal 1 (Week 4): Pt will perform TB transfers with mod A+1 on a level surface OT Short Term Goal 2 (Week 4): Pt will perform LB dressing with max A at bed level OT Short Term Goal 3 (Week 4): Pt will complete UB dressing with min A seated EOB or in w/c  Skilled Therapeutic Interventions/Progress Updates:    Pt resting in bed with eyes closed upon arrival. Easily aroused. Pt's lunch on bedside table placed in position for pt to eat. Pt leaning to Lt in bed and required max A for repositioning in bed. Pt started eating lunch when aroused. Pt missed 30 mins skilled OT services. Will attempt to make up as schedule allows.  Therapy Documentation Precautions:  Precautions Precautions: Fall, Cervical Precaution Comments: hx of many falls, severe B LE hypertonia with muscle spasms, verbally went through cervical precautions throughout session Required Braces or Orthoses: Cervical Brace Cervical Brace: Hard collar, Other (comment) (OOB) Restrictions Weight Bearing Restrictions Per Provider Order: No Other Position/Activity Restrictions: cervical precautions General: General OT Amount of Missed Time: 30 Minutes Vital Signs:   Pain: This is the worst when questioned about pain; meds admin prior to therapy and repositioned  Therapy/Group: Individual Therapy  Maritza Debby Mare 07/05/2023, 2:09 PM

## 2023-07-05 NOTE — Progress Notes (Signed)
 Physical Therapy Session Note  Patient Details  Name: Leon Rodriguez MRN: [id number] Date of Birth: 03/11/1966  Today's Date: 07/05/2023 PT Individual Time: 1045-1130 PT Individual Time Calculation (min): 45 min   Short Term Goals: Week 3:  PT Short Term Goal 1 (Week 3): Pt will verbalize pressure relief with cueing PT Short Term Goal 2 (Week 3): Pt will perform SBT with assist of 1 PT Short Term Goal 3 (Week 3): Pt will initiate care direction with transfers.  Skilled Therapeutic Interventions/Progress Updates: Pt presented in TIS with SO present agreeable to therapy. Pt c/o unrated pain due to spasms. Breaks and repositioning provided as needed during session. Pt noted to be in [place] w/c with pt c/o was too small I hate this chair. Discussed with pt that this is only temporary and that this is a trial until w/c eval and will be fitted through more appropriate chair. Educated that pt desired to have the ability to propel himself to give himself more independence, pt reluctantly verbalized understanding. Pt transported to day room and completed w/c propulsion across room to standing frame as pt indicated desire to stand. PTA donned socks and shoes to protect feet and pt was able to scoot anteriorly towards EOC with verbal cues and CGA. Towel roll was placed between pt's feet and knees and gait belt was placed around foot support of standing frame and feet to minimize feet movement with spasms. Pt was able to complete x 2 stands at 3 min each but required increased time between bouts due to fatigue and spasms. Nsg notified that pt needs to be returned to bed due to upcoming MRI. Once completed pt transported back to room and was able to scoot anteriorly in same manner as prior. Pt required total A for Slide board set up. Pt completed Slide board transfer maxA x 1 with +2 present for safety. Pt required MAX multimodal cues for leaning anteriorly for improved efficacy of transfer. Pt verbalized  understanding of why he needs to lean forward but has difficulty executing and limited carryover. Pt was able to complete sit to supine with modA however. Pt positioned to comfort and left in bed at end of session with bed alarm on, call bell within reach and current needs met.      Therapy Documentation Precautions:  Precautions Precautions: Fall, Cervical Precaution Comments: hx of many falls, severe B LE hypertonia with muscle spasms, verbally went through cervical precautions throughout session Required Braces or Orthoses: Cervical Brace Cervical Brace: Hard collar, Other (comment) (OOB) Restrictions Weight Bearing Restrictions Per Provider Order: No Other Position/Activity Restrictions: cervical precautions General:   Vital Signs: Therapy Vitals Temp: (!) 97.5 F (36.4 C) Pulse Rate: 89 Resp: 18 BP: 124/74 Patient Position (if appropriate): Lying Oxygen Therapy SpO2: 100 % O2 Device: Room Air Pain: Pain Assessment Pain Scale: 0-10 Pain Score: 10-Worst pain ever Mobility:   Locomotion :    Trunk/Postural Assessment :    Balance:   Exercises:   Other Treatments:      Therapy/Group: Individual Therapy  Tiffany Calmes 07/05/2023, 4:21 PM

## 2023-07-06 ENCOUNTER — Inpatient Hospital Stay (HOSPITAL_COMMUNITY): Payer: Medicaid Other

## 2023-07-06 DIAGNOSIS — N319 Neuromuscular dysfunction of bladder, unspecified: Secondary | ICD-10-CM | POA: Diagnosis not present

## 2023-07-06 DIAGNOSIS — F4321 Adjustment disorder with depressed mood: Secondary | ICD-10-CM | POA: Diagnosis not present

## 2023-07-06 DIAGNOSIS — K592 Neurogenic bowel, not elsewhere classified: Secondary | ICD-10-CM | POA: Diagnosis not present

## 2023-07-06 DIAGNOSIS — G959 Disease of spinal cord, unspecified: Secondary | ICD-10-CM | POA: Diagnosis not present

## 2023-07-06 NOTE — Progress Notes (Signed)
 PROGRESS NOTE   Subjective/Complaints: No acute events overnight.  Patient with multiple complaints regarding his overall stay at the hospital.  Reports he has had a good experience working with Dr. Cornelio and Holley Schmitz.  Fianc in the room. Thoracic MRI was completed today, motion degraded.  Patient reports this was post to be under anesthesia due to poor tolerance of prior MRIs.  Patient is unhappy with his bed, feels like it is uncomfortable. Reports having very large bowel movements  ROS:  + spasms - ongoing- bothersome, very limiting +confusion/sundowning-due to Oxy    Pt denies fever , SOB, abd pain, CP, N/V/C/D, and vision changes  Negative except for HPI  Objective:   MR THORACIC SPINE WO CONTRAST Result Date: 07/06/2023 CLINICAL DATA:  Spinal cord injury.  Severe spasms EXAM: MRI THORACIC SPINE WITHOUT CONTRAST TECHNIQUE: Multiplanar, multisequence MR imaging of the thoracic spine was performed. No intravenous contrast was administered. COMPARISON:  MRI 06/11/2023 FINDINGS: Technical Note: Despite efforts by the technologist and patient, motion artifact is present on today's exam and could not be eliminated. This reduces exam sensitivity and specificity. Alignment:  Unchanged alignment.  No traumatic listhesis. Vertebrae: Susceptibility artifact from ACDF hardware is present at the C6-T1 levels. Thoracic vertebral body heights are maintained. No acute fracture. No evidence of discitis. No suspicious marrow replacing bone lesions. Cord: Suspected increased signal within the lower cervical cord as seen on the previous study, evaluation of which is significantly degraded by motion artifact. No convincing sites of thoracic cord signal abnormality within the limitations of this motion degraded examination. Paraspinal and other soft tissues: Negative. Disc levels: Severe canal stenosis at the C7-T1 level. Additional sites of mild to  moderate canal stenosis including at the T1-T2, T2-T3, T4-T5, T6-T7 levels is unchanged from prior. Similar degree of bilateral foraminal stenosis, notably involving the T1-T2 and T11-T12 levels. IMPRESSION: 1. No convincing sites of thoracic cord signal abnormality within the limitations of this motion degraded examination. Suspected increased signal within the lower cervical cord as seen on the previous study, evaluation of which is significantly degraded by motion artifact. 2. No new or acute osseous abnormality of the thoracic spine. 3. Severe canal stenosis at the C7-T1 level. Additional sites of mild to moderate canal stenosis including at the T1-T2, T2-T3, T4-T5, T6-T7 levels is unchanged from prior. Electronically Signed   By: Mabel Converse D.O.   On: 07/06/2023 13:00   Recent Labs    07/04/23 0642  WBC 6.0  HGB 11.7*  HCT 33.9*  PLT 293     Recent Labs    07/04/23 0642  NA 139  K 4.0  CL 103  CO2 28  GLUCOSE 102*  BUN 11  CREATININE 0.73  CALCIUM 9.3      Intake/Output Summary (Last 24 hours) at 07/06/2023 1601 Last data filed at 07/06/2023 1319 Gross per 24 hour  Intake 577 ml  Output 2000 ml  Net -1423 ml        Physical Exam: Vital Signs Blood pressure 106/74, pulse 86, temperature 98.7 F (37.1 C), resp. rate 17, height 5' 9 (1.753 m), weight 89.5 kg, SpO2 100%.      General: awake,  alert, appropriate, fiance' at bedside; NAD HENT: conjugate gaze; oropharynx moist CV: regular rate and rhythm; no JVD Pulmonary: CTA B/L; no W/R/R- good air movement, on RA GI: soft, NT, ND, (+)BS Psychiatric: a  little agitated  Neurological: Ox3 Severe spasms still seen B/L  Ext: no clubbing, cyanosis, or edema Skin- healing blister on top of L foot-stable -Area of discoloration darkened skin on right heel, does not appear to be DTI  Neurological: Awake, alert, oriented x 3.  Follows all simple commands.  1-5: Left lower extremity MAS 2 knee extensors, MAS 1  plantar flexors, MAS 2 adductors Right lower extremity MAS 3-4 knee extensors, MAS 3 plantarflexors, MAS 3 adductors  Strength: Moving bilateral upper extremities antigravity against resistance; bilateral lower extremities not quite antigravity in hip flexion Sensation: Altered below T4  Prior:  Comments: Ue's 5-/5 throughout on exam B/L RLE- HF 2-/5; KE 2-/5; DF 4-/5; and PF 4-/5 LLE- HF 2-/5; KE 2-/5; DF 2/5; PF 2/5- spasticity still present Sensory level at T4 and below-      Assessment/Plan: 1. Functional deficits which require 3+ hours per day of interdisciplinary therapy in a comprehensive inpatient rehab setting. Physiatrist is providing close team supervision and 24 hour management of active medical problems listed below. Physiatrist and rehab team continue to assess barriers to discharge/monitor patient progress toward functional and medical goals  Care Tool:  Bathing    Body parts bathed by patient: Face   Body parts bathed by helper: Right arm, Left arm, Chest, Abdomen, Front perineal area, Buttocks, Right upper leg, Left upper leg, Right lower leg, Left lower leg (wife assisted pt with bath prior to OT evaluation)     Bathing assist Assist Level: Total Assistance - Patient < 25%     Upper Body Dressing/Undressing Upper body dressing   What is the patient wearing?: Pull over shirt    Upper body assist Assist Level: Minimal Assistance - Patient > 75%    Lower Body Dressing/Undressing Lower body dressing      What is the patient wearing?: Pants, Incontinence brief     Lower body assist Assist for lower body dressing: Total Assistance - Patient < 25%     Toileting Toileting    Toileting assist Assist for toileting: 2 Helpers     Transfers Chair/bed transfer  Transfers assist     Chair/bed transfer assist level: 2 Helpers     Locomotion Ambulation   Ambulation assist   Ambulation activity did not occur: Safety/medical concerns (limited by  extreme tone)          Walk 10 feet activity   Assist  Walk 10 feet activity did not occur: Safety/medical concerns        Walk 50 feet activity   Assist Walk 50 feet with 2 turns activity did not occur: Safety/medical concerns         Walk 150 feet activity   Assist Walk 150 feet activity did not occur: Safety/medical concerns         Walk 10 feet on uneven surface  activity   Assist Walk 10 feet on uneven surfaces activity did not occur: Safety/medical concerns         Wheelchair     Assist Is the patient using a wheelchair?: Yes Type of Wheelchair: Manual Wheelchair activity did not occur: Safety/medical concerns  Wheelchair assist level: Dependent - Patient 0% Max wheelchair distance: 150    Wheelchair 50 feet with 2 turns activity    Assist  Wheelchair 50 feet with 2 turns activity did not occur: Safety/medical concerns   Assist Level: Dependent - Patient 0%   Wheelchair 150 feet activity     Assist  Wheelchair 150 feet activity did not occur: Safety/medical concerns   Assist Level: Dependent - Patient 0%   Blood pressure 106/74, pulse 86, temperature 98.7 F (37.1 C), resp. rate 17, height 5' 9 (1.753 m), weight 89.5 kg, SpO2 100%.  Medical Problem List and Plan: 1. Functional deficits secondary to Cervical myelopathy s/p ACDF- back after sepsis             -patient may  shower             -ELOS/Goals: 3-4 weeks- min A hopefully- likely mod to max A Grounds pass ordered  D/c 07/17/23 -have counseled pt/wife about why CT myelo wasn't performed. Discussed spasticity.  D/w pt and fiance that [facility] doesn't think he could get ITB pump due to lack of flow of CSF at distal end of Spinal cord- where he needs it most/due to more spasticity in legs than arms Con't CIR PT and OT Other physicians I spoke to suggest restrying Dantrolene - they don't usually see if cause a lot of sedation. Since so helpful in many cases, will d/w  pt/fiance' again.  Reinforced he is NOT a candidate for ITB pump D/W Dr Carollee and Dr Vona at [facility] about possible SDR- as well as getting thoracic MRI to rul out another cause of spasticity being so bad.  1/11 T spine MRI completed today, does not appear to have been done under anesthesia some motion artifacts, continued canal stenosis C7-T1 noted severe and other sites of mild to moderate stenosis noted in thoracic cord however overall unchanged from prior 2.  Antithrombotics: -DVT/anticoagulation:  Pharmaceutical: Lovenox              -antiplatelet therapy: N/A 3. Pain Management/spasticity:  MS contin  15 mg bid, Oxycodone  prn. Lidoderm  patch for local measures. Tylenol  650 mg qid.             --bilateral hip pain with attempts at rolling.              --on Baclofen  20 mg qid, Valium  5 mg qid with Zanaflex  4 mg qid prn for spasticity.  -12/27 Started Duloxetine  20 mg at bedtime 12/25- titrate up every 3-4 days as tolerated  -bumped baclofen  to 25mg  qid, consider scheduling tizanidine   -added gabapentin  100mg  tid for injury level/chest wall pain/spasticity 12/29- pushing antispasmodics given the severity of his spasticity. Continue to titrate neuro-sedation permitting -increased baclofen  to 30mg  qid 12/28 -continue valium , consider scheduled tizanidine  also -increased duloxetine  (now in am) to 30mg  -pt refuses to take gabapentin  as he doesn't want more potentially sedating medication--I stopped it yesterday 12/30- will add Dantrolene  50 mg at bedtime- explained it's gradual onset- will increase q5 days 12/31- will stop Dantrolene - so sleepy this AM- calling Dr Carollee to see what he thinks about placing ITB pump sooner than later? 1/1- educated fiance and pt that cannot do ITB pump due to lack of flow of CSF- p[er [facility]- reason couldn't get LP for CT myelogram Will wait for now on Keppra , but if sedation gets better, can try 1/2- sinc ehaving confusion, will stop Zanaflex - in rare cases, can  cause hallucinations (I've sene in other patients) and will give more baclofen  prn 10 mg TID prn 1/3- tone if anything looking worse- don't know what else can do right now except Keppra , but will  need to reduce Valium , or something else to try the Keppra  1/4:  discussed with patient, wish to wait until primary team is back on Monday to make major medication adjustments/add Keppra - 1/5: tone remains severe, again patient wishes major medication adjustments per primary team, but could likely retry Zanaflex  given confusion has not improved 1/6- will add keppra  250 mg BID and reduce MS Contin  to 1x/day in evening. To reduce sedation during day.  1/7- pain more in AM yesterday, but did better overnight- let's see if Keppra  helps or hurts?will d/w SCI docs to see what opther options there might be 1/8- Oxy was what made pt confused yesterday per wife- wil sop and change to Norco- less chance of confusion 1/9- very sleepy this AM- will d/w nursing why more today? 1/10- delay in getting pain meds today and Vlaium- d/w PA 1/11 continues to have poor body wide pain, sounds like possibly dantrolene  may be helping however difficult to assess as patient is more agitated today.  Does not appear this is making him sedated thus far.  Will see if we can get him different mattress 4. Mood/Behavior/Sleep: LCSW to follow for evaluation and support.              -antipsychotic agents: N/A  -12/27--trazodone  and melatonin helpful 12/28 doing better with earlier scheduling of trazodone  and melatonin 1/2- having some confusion at night- not sure if due to Zanaflex , which I sotpped, but will give Seroquel  25 mg at bedtime to help as well -d/c trazodone  1/3- will increase Seorquel to 50 mg at bedtime- took clothes off last night- got confused, but calmer, per fiance'.  1/4: Increase seroquel  to 75 mg nightly, and retimed to 8 PM 1-5: DC melatonin due to vivid dreams.  No active hallucinations last night, so continue current  dose of Seroquel ..  Patient endorsed passive SI yesterday, resolved today, he is agreeable to immediately notifying physician and staff if this recurs--psychiatry not consulted due to patient being no active risk of self-harm and with recent start of duloxetine  1/6- will increase Duloxetine  to 60 mg at bedtime- since sedated during day, to reduce daytime sedation 1/9- will make seroquel  75 mg at bedtime PRN and see if that helps am sedation- I'm also wondering if increase in Duloxetine  making him sleepier- will check on him again today.  1/11 appears awake and alert, continue current regimen for now 5. Neuropsych/cognition: This patient is? capable of making decisions on his own behalf. 6. Skin/Wound Care: Routine pressure relief measures.   -has developed left heel sore d/t tone, struggles to fit in [facility]  -heel dressed, ordered prevalon boots so that he's better able to tolerate wear-- -12/29 prevalon boots in place this am--tolerating -1/4: right heel ?DTI on exam--more consistent with skin color variation.  Other wounds appear improving/stable.  7. Hyponatremia: continue to monitor Na weekly  12/30- Na 139  8. Klebsiella aerogenes bacteremia/sepsis /UTI: levaquin   -abx completed per ID recs--7 days  9. Neurogenic bowel: Continue Senna S 2 tabs BID. Miralax  daily-->has been incontinent of bowel and question due to antibiotics and/or laxatives --Suppository daily after supper-->has refused for past 3 nights.  --would prefer enema three times a week as at home. Have suggested using mini enema 1/7- LBM last night with Enemeze 1/8- refused bowel program tonight- wants a break 1/9- Refused bowel program again last night- concerned his constipation will get him more spastic 1/11- fiance did bowel program last night 1/11 continue to encourage regular bowel program, LBM  1/9 10. Neurogenic bladder: Foley was replaced on acute-->will d/c and start bladder program. (patient would like to  resume) --Monitor voiding with PVR checks             --use coude cath and monitor for recurrent hematuria             --continue flomax \  12/29 cath volumes generally in range until this morning--continue current schedule  12/31- Cath volumes looking good- cathing q4 hours  1/1- per fiance' Draining slow/caths- will speak to nursing- on max dose of Flomax  to try and get pt to void.   1/2- Pt's cathing is doing well- went 1 time 5 hours 45 minutes last night- but fiance concerned they are waiting for her to do- will need help with this.   1/3-5 cath volumes going well --continue cathing  1/6- will change caths to q4 hours scheduled to reduce spasticity  1/7- fiance doing most of caths  1/11 cath volumes appear overall appropriate 11. Neuropathy/at level SCI pain: Was on Cymbalta  PTA-resumed at 20 mg at bedtime, continue--increased to 30mg  daily effective 12/28  12/30- doesn't want Cymbalta  for pain, but willing to take for mood  1-5: See #4 above  1/6- will increase Duloxetine  to 60 mg at bedtime for pain/mood.  12. Abd pain/gas/at level SCI pain  1/6- will add Gasx 160 mg QID and also Keppra  250 mg BID   1/7- No major changes today- will monitor closely  1/10- will stop Keppra -no improvement     LOS: 18 days A FACE TO FACE EVALUATION WAS PERFORMED  Murray Collier 07/06/2023, 4:01 PM

## 2023-07-06 NOTE — Progress Notes (Signed)
 Occupational Therapy Session Note  Patient Details  Name: Leon Rodriguez MRN: [id number] Date of Birth: 1966/05/01  Today's Date: 07/06/2023 OT Individual Time: 8491-8449 OT Individual Time Calculation (min): 42 min    Short Term Goals: Week 1:    Week 2:  OT Short Term Goal 1 (Week 2): Pt will be able to tolerate sitting EOB with min A and no back support to wash UB with min A. OT Short Term Goal 1 - Progress (Week 2): Met OT Short Term Goal 2 (Week 2): Pt will be able to don a shirt with min A sitting EOB with no back support. OT Short Term Goal 2 - Progress (Week 2): Progressing toward goal OT Short Term Goal 3 (Week 2): Pt will be able wash LE from bed level with mod A. OT Short Term Goal 3 - Progress (Week 2): Progressing toward goal OT Short Term Goal 4 (Week 2): pt will be able to rise to stand in a standard stedy lift with max A of 2. OT Short Term Goal 4 - Progress (Week 2): Progressing toward goal Week 3:  OT Short Term Goal 1 (Week 3): Pt will be able to don a shirt with min A sitting EOB with no back support. OT Short Term Goal 2 (Week 3): Pt will be able wash LE from bed level with mod A. OT Short Term Goal 2 - Progress (Week 3): Met OT Short Term Goal 3 (Week 3): pt will be able to rise to stand in a standard stedy lift with max A of 2. OT Short Term Goal 3 - Progress (Week 3): Met OT Short Term Goal 4 (Week 3): Pt will perform TB transfers with mod A+1 on a level surface OT Short Term Goal 4 - Progress (Week 3): Progressing toward goal Week 4:  OT Short Term Goal 1 (Week 4): Pt will perform TB transfers with mod A+1 on a level surface OT Short Term Goal 2 (Week 4): Pt will perform LB dressing with max A at bed level OT Short Term Goal 3 (Week 4): Pt will complete UB dressing with min A seated EOB or in w/c  Skilled Therapeutic Interventions/Progress Updates:    Pt received in bed with wife present.  Pt upset that he was not completely knocked out for the MRI scan he  was just taken to.  Pt alert.  To distract pt, had him focus on what the plan was for this session. Pt did want to transfer to the tilt in space w/c.  Rehab tech present for +2 A during session as needed.   Pt came to EOB with CGA. RLE spasming and legs crossing. Cued pt to use a deep diaphramatic inhale and then exhalation to relax his muscles as therapist guided his legs into position.  Pt cued to continue this breath work through out the session to keep his body more relaxed.   Board placed under L thigh.  Pt initially started trying to transfer by pushing upward trying to lift self with arms.  Demonstrated to pt how to gently lean forward and use the head/hip orientation to allow him to make gradual shifts on the board. He was able to do this from bed to chair with only CGA!   Pt able to use hands on armrests to continue to shift hips back into chair.   Pt taken to day room gym to work at standing frame.  Pt positioned with belt around ankles to support feet to  frame and pillow between knees.  Had pt use breath work and close his eyes to imaging his legs were actively pushing him to tall stand as the standing frame lift was moving. Once up in frame,  his L leg moved into slight knee flexion but did not push against belt aggressively.  Pt initially hunching trunk. Cued pt to lift sternum and squeeze shoulder blades together.  Pt tolerated standing frame for 10 minutes. While standing,  had pt work on alternating overhead arm reaches to encourage back elongation.  Tried to do some cross friction massage to L hamstring while pt was standing but pt did not feel that was helping.   On return to sit, with cues for breathing technique pt able to be lowered to wc with no significant increase in spasms.  Pt then adjusted hips back further.    Pt returned to room and opted to stay in w/c. Chair tilted back to slight recline. Wife in room with pt.   Therapy Documentation Precautions:  Precautions Precautions:  Fall, Cervical Precaution Comments: hx of many falls, severe B LE hypertonia with muscle spasms, verbally went through cervical precautions throughout session Required Braces or Orthoses: Cervical Brace Cervical Brace: Hard collar, Other (comment) (OOB) Restrictions Weight Bearing Restrictions Per Provider Order: Yes Other Position/Activity Restrictions: cervical precautions   Pain:  No c/o pain during OT session     Therapy/Group: Individual Therapy  Travarus Trudo 07/06/2023, 5:36 PM

## 2023-07-06 NOTE — Progress Notes (Signed)
 Patient and family member refused bowel program and enema at scheduled time. Stated they want it done later in the evening.

## 2023-07-06 NOTE — Progress Notes (Signed)
 Physical Therapy Session Note  Patient Details  Name: Leon Rodriguez MRN: [id number] Date of Birth: Sep 17, 1965  Today's Date: 07/06/2023 PT Individual Time: 9041-8957 + 8877-8846 PT Individual Time Calculation (min): 44 min + 31 min  Short Term Goals: Week 3:  PT Short Term Goal 1 (Week 3): Pt will verbalize pressure relief with cueing PT Short Term Goal 2 (Week 3): Pt will perform SBT with assist of 1 PT Short Term Goal 3 (Week 3): Pt will initiate care direction with transfers.  Skilled Therapeutic Interventions/Progress Updates:    SESSION 1: Pt presents in room in bed, agreeable to PT. Pt denies pain at this time, endorsing fatigue. Session focused on transfer training, bed mobility, and WC mobility training.  Pt requires max assist for donning pants in supine secondary to tone in BLEs. Pt completes rolling bilaterally with supervision for donning pants in supine over hips. Pt then completes bed mobiltiy with mod assist, pt self directing care. Pt then verbalizing steps of transfer from bed to self propel TIS, requires max assist x1 for positioning slideboard, set up of WC, and gluteal clearance for transfer over slideboard, requires 2nd person for maintaining position of slideboard.  Pt self propels from room to day room ~100' with min assist initially for navigating obstacles in room and turning into hallway, pt then progress to supervision with intermittent min assist for obstacle negotiation.  Pt self propels around 5 cones with supervision cues for navigating obstacles x2 trials, increased proximity to obstacle on L side. Pt then completes same activity with bowling pins as obstacle with goal to not knock over bowling pins with pt demonstrating improved obstacle negotiation with less cues.  Pt self propels WC 160' with supervision and cues for increasing efficiency with propulsion arc as pt demonstrating small pushes does not let go of wheel, improves to larger propulsion arc with  cues but requires increased cueing for obstacle negotaition.  Pt then begins to demonstrate significant outburst of pain and BLE muscle spasm with pt reporting due to WC size. Pt transported back to room and completes slideboard transfer max assist x2 with muscle spasm, completes sit to supine with max assist x2 for trunk and BLE mgmt. Pt repositions self towards [facility] with assist x2.  Pt remains supine with all needs within reach, call light in place and pt wife at bedside at end of session.   SESSION 2: Pt presents in room in bed, agreeable to PT however pt with notable increase in fatigue, decreased verbalizations throughout session. Vitals assessed with pt in sitting to be WNL, BP 120/71. Pt reporting pain with muscle spasms. Session focused on transfer training and bed mobility.  Pt completes bed mobility with mod assist for BLE mgmt off bed. Pt completes slideboard transfer bed to [facility] for improved comfort and upright tolerance with max assist x1 and 2nd person holding slideboard in place. Pt requires max assist for positioning once in WC. Pt transported to day room dependently.  Pt positioned in standing frame to complete standing practice. Pt positioned with 8# weights on BLEs, gait belt around foot positioning board and behind ankles to decrease BLE repositioning due to spasms, and towel roll placed between heels and knees. Set up with increased time to provide increased success and upright tolerance however, as pt begins to stand, MRI transport arrives for stat MRI. Pt positioned back in WC and transported dependently back to room. Pt requires max assist x2 for slideboard transfer due to muscle spasms and  decreased ability to assist. Pt requires max assist x2 for sit to supine. Pt remains supine in bed, handoff to RN and MRI transport. Pt missing 14 minutes of 45 min session due to MRI.   Therapy Documentation Precautions:  Precautions Precautions: Fall, Cervical Precaution Comments: hx  of many falls, severe B LE hypertonia with muscle spasms, verbally went through cervical precautions throughout session Required Braces or Orthoses: Cervical Brace Cervical Brace: Hard collar, Other (comment) (OOB) Restrictions Weight Bearing Restrictions Per Provider Order: Yes Other Position/Activity Restrictions: cervical precautions General: PT Amount of Missed Time (min): 14 Minutes PT Missed Treatment Reason: CT/MRI    Therapy/Group: Individual Therapy  Reche Ohara PT, DPT 07/06/2023, 12:01 PM

## 2023-07-07 DIAGNOSIS — N319 Neuromuscular dysfunction of bladder, unspecified: Secondary | ICD-10-CM | POA: Diagnosis not present

## 2023-07-07 DIAGNOSIS — G959 Disease of spinal cord, unspecified: Secondary | ICD-10-CM | POA: Diagnosis not present

## 2023-07-07 DIAGNOSIS — K592 Neurogenic bowel, not elsewhere classified: Secondary | ICD-10-CM | POA: Diagnosis not present

## 2023-07-07 DIAGNOSIS — R252 Cramp and spasm: Secondary | ICD-10-CM | POA: Diagnosis not present

## 2023-07-07 NOTE — Progress Notes (Signed)
 IP Rehab Bowel Program Documentation   Bowel Program Start time [phone]  Dig Stim Indicated? Yes  Dig Stim Prior to Suppository or mini Enema X 1   Output from dig stim: Minimal  Ordered intervention: Suppository Yes , mini enema Yes ,   Repeat dig stim after Suppository or Mini enema  X 0,  Output? Minimal   Bowel Program Complete? Yes , handoff given   Patient Tolerated? Yes      Assisted by fiance.

## 2023-07-07 NOTE — Plan of Care (Signed)
  Problem: SCI BLADDER ELIMINATION Goal: RH STG MANAGE BLADDER WITH ASSISTANCE Description: STG Manage Bladder With toileting Assistance Outcome: Progressing   Problem: RH SKIN INTEGRITY Goal: RH STG SKIN FREE OF INFECTION/BREAKDOWN Description: Manage w min assist Outcome: Progressing   Problem: RH SAFETY Goal: RH STG ADHERE TO SAFETY PRECAUTIONS W/ASSISTANCE/DEVICE Description: STG Adhere to Safety Precautions With cues  Assistance/Device. Outcome: Progressing

## 2023-07-07 NOTE — Progress Notes (Signed)
 PROGRESS NOTE   Subjective/Complaints: No acute events noted overnight.  Sitting in wheelchair, sleeping when I first came in.  Reports he had results with bowel program yesterday.  Patient indicates dantrolene  is helping his muscle spasms.  ROS:  + spasms - ongoing- bothersome, very limiting-patient indicates he thinks dantrolene  is helping +confusion/sundowning-due to Oxy    Pt denies fever , SOB, abd pain, CP, N/V/C/D, and vision changes  Negative except for HPI  Objective:   MR THORACIC SPINE WO CONTRAST Result Date: 07/06/2023 CLINICAL DATA:  Spinal cord injury.  Severe spasms EXAM: MRI THORACIC SPINE WITHOUT CONTRAST TECHNIQUE: Multiplanar, multisequence MR imaging of the thoracic spine was performed. No intravenous contrast was administered. COMPARISON:  MRI 06/11/2023 FINDINGS: Technical Note: Despite efforts by the technologist and patient, motion artifact is present on today's exam and could not be eliminated. This reduces exam sensitivity and specificity. Alignment:  Unchanged alignment.  No traumatic listhesis. Vertebrae: Susceptibility artifact from ACDF hardware is present at the C6-T1 levels. Thoracic vertebral body heights are maintained. No acute fracture. No evidence of discitis. No suspicious marrow replacing bone lesions. Cord: Suspected increased signal within the lower cervical cord as seen on the previous study, evaluation of which is significantly degraded by motion artifact. No convincing sites of thoracic cord signal abnormality within the limitations of this motion degraded examination. Paraspinal and other soft tissues: Negative. Disc levels: Severe canal stenosis at the C7-T1 level. Additional sites of mild to moderate canal stenosis including at the T1-T2, T2-T3, T4-T5, T6-T7 levels is unchanged from prior. Similar degree of bilateral foraminal stenosis, notably involving the T1-T2 and T11-T12 levels. IMPRESSION:  1. No convincing sites of thoracic cord signal abnormality within the limitations of this motion degraded examination. Suspected increased signal within the lower cervical cord as seen on the previous study, evaluation of which is significantly degraded by motion artifact. 2. No new or acute osseous abnormality of the thoracic spine. 3. Severe canal stenosis at the C7-T1 level. Additional sites of mild to moderate canal stenosis including at the T1-T2, T2-T3, T4-T5, T6-T7 levels is unchanged from prior. Electronically Signed   By: Mabel Converse D.O.   On: 07/06/2023 13:00   No results for input(s): WBC, HGB, HCT, PLT in the last 72 hours.    No results for input(s): NA, K, CL, CO2, GLUCOSE, BUN, CREATININE, CALCIUM in the last 72 hours.     Intake/Output Summary (Last 24 hours) at 07/07/2023 1358 Last data filed at 07/07/2023 1336 Gross per 24 hour  Intake --  Output 2000 ml  Net -2000 ml        Physical Exam: Vital Signs Blood pressure 106/74, pulse 86, temperature 98.7 F (37.1 C), resp. rate 17, height 5' 9 (1.753 m), weight 90 kg, SpO2 100%.      General: awake, alert, appropriate, fiance' at bedside; NAD, sitting in wheelchair HENT: conjugate gaze; oropharynx moist CV: regular rate and rhythm; no JVD Pulmonary: CTA B/L; no W/R/R- good air movement, on RA GI: soft, NT, ND, (+)BS Psychiatric: Cooperative, appropriate Neurological: Sleeping initially but wakes to voice Ext: no clubbing, cyanosis, or edema Skin- healing blister on top of L foot-stable -Area of  discoloration darkened skin on right heel, does not appear to be DTI  Neurological: Awake, alert, oriented x 3.  Follows all simple commands.  1-5: Left lower extremity MAS 2 knee extensors, MAS 1 plantar flexors, MAS 2 adductors Right lower extremity MAS 3-4 knee extensors, MAS 3 plantarflexors, MAS 3 adductors  Strength: Moving bilateral upper extremities antigravity against resistance;  bilateral lower extremities not quite antigravity in hip flexion Sensation: Altered below T4  Prior:  Comments: Ue's 5-/5 throughout on exam B/L RLE- HF 2-/5; KE 2-/5; DF 4-/5; and PF 4-/5 LLE- HF 2-/5; KE 2-/5; DF 2/5; PF 2/5- spasticity still present Sensory level at T4 and below-      Assessment/Plan: 1. Functional deficits which require 3+ hours per day of interdisciplinary therapy in a comprehensive inpatient rehab setting. Physiatrist is providing close team supervision and 24 hour management of active medical problems listed below. Physiatrist and rehab team continue to assess barriers to discharge/monitor patient progress toward functional and medical goals  Care Tool:  Bathing    Body parts bathed by patient: Face   Body parts bathed by helper: Right arm, Left arm, Chest, Abdomen, Front perineal area, Buttocks, Right upper leg, Left upper leg, Right lower leg, Left lower leg (wife assisted pt with bath prior to OT evaluation)     Bathing assist Assist Level: Total Assistance - Patient < 25%     Upper Body Dressing/Undressing Upper body dressing   What is the patient wearing?: Pull over shirt    Upper body assist Assist Level: Minimal Assistance - Patient > 75%    Lower Body Dressing/Undressing Lower body dressing      What is the patient wearing?: Pants, Incontinence brief     Lower body assist Assist for lower body dressing: Total Assistance - Patient < 25%     Toileting Toileting    Toileting assist Assist for toileting: 2 Helpers     Transfers Chair/bed transfer  Transfers assist     Chair/bed transfer assist level: 2 Helpers     Locomotion Ambulation   Ambulation assist   Ambulation activity did not occur: Safety/medical concerns (limited by extreme tone)          Walk 10 feet activity   Assist  Walk 10 feet activity did not occur: Safety/medical concerns        Walk 50 feet activity   Assist Walk 50 feet with 2 turns  activity did not occur: Safety/medical concerns         Walk 150 feet activity   Assist Walk 150 feet activity did not occur: Safety/medical concerns         Walk 10 feet on uneven surface  activity   Assist Walk 10 feet on uneven surfaces activity did not occur: Safety/medical concerns         Wheelchair     Assist Is the patient using a wheelchair?: Yes Type of Wheelchair: Manual Wheelchair activity did not occur: Safety/medical concerns  Wheelchair assist level: Dependent - Patient 0% Max wheelchair distance: 150    Wheelchair 50 feet with 2 turns activity    Assist    Wheelchair 50 feet with 2 turns activity did not occur: Safety/medical concerns   Assist Level: Dependent - Patient 0%   Wheelchair 150 feet activity     Assist  Wheelchair 150 feet activity did not occur: Safety/medical concerns   Assist Level: Dependent - Patient 0%   Blood pressure 106/74, pulse 86, temperature 98.7 F (37.1 C),  resp. rate 17, height 5' 9 (1.753 m), weight 90 kg, SpO2 100%.  Medical Problem List and Plan: 1. Functional deficits secondary to Cervical myelopathy s/p ACDF- back after sepsis             -patient may  shower             -ELOS/Goals: 3-4 weeks- min A hopefully- likely mod to max A Grounds pass ordered  D/c 07/17/23 -have counseled pt/wife about why CT myelo wasn't performed. Discussed spasticity.  D/w pt and fiance that [facility] doesn't think he could get ITB pump due to lack of flow of CSF at distal end of Spinal cord- where he needs it most/due to more spasticity in legs than arms Con't CIR PT and OT Other physicians I spoke to suggest restrying Dantrolene - they don't usually see if cause a lot of sedation. Since so helpful in many cases, will d/w pt/fiance' again.  Reinforced he is NOT a candidate for ITB pump D/W Dr Carollee and Dr Vona at [facility] about possible SDR- as well as getting thoracic MRI to rul out another cause of spasticity being so  bad.  1/11 T spine MRI completed today, does not appear to have been done under anesthesia some motion artifacts, continued canal stenosis C7-T1 noted severe and other sites of mild to moderate stenosis noted in thoracic cord however overall unchanged from prior Discussed results of T-spine MRI with patient 2.  Antithrombotics: -DVT/anticoagulation:  Pharmaceutical: Lovenox              -antiplatelet therapy: N/A 3. Pain Management/spasticity:  MS contin  15 mg bid, Oxycodone  prn. Lidoderm  patch for local measures. Tylenol  650 mg qid.             --bilateral hip pain with attempts at rolling.              --on Baclofen  20 mg qid, Valium  5 mg qid with Zanaflex  4 mg qid prn for spasticity.  -12/27 Started Duloxetine  20 mg at bedtime 12/25- titrate up every 3-4 days as tolerated  -bumped baclofen  to 25mg  qid, consider scheduling tizanidine   -added gabapentin  100mg  tid for injury level/chest wall pain/spasticity 12/29- pushing antispasmodics given the severity of his spasticity. Continue to titrate neuro-sedation permitting -increased baclofen  to 30mg  qid 12/28 -continue valium , consider scheduled tizanidine  also -increased duloxetine  (now in am) to 30mg  -pt refuses to take gabapentin  as he doesn't want more potentially sedating medication--I stopped it yesterday 12/30- will add Dantrolene  50 mg at bedtime- explained it's gradual onset- will increase q5 days 12/31- will stop Dantrolene - so sleepy this AM- calling Dr Carollee to see what he thinks about placing ITB pump sooner than later? 1/1- educated fiance and pt that cannot do ITB pump due to lack of flow of CSF- p[er [facility]- reason couldn't get LP for CT myelogram Will wait for now on Keppra , but if sedation gets better, can try 1/2- sinc ehaving confusion, will stop Zanaflex - in rare cases, can cause hallucinations (I've sene in other patients) and will give more baclofen  prn 10 mg TID prn 1/3- tone if anything looking worse- don't know what else can  do right now except Keppra , but will need to reduce Valium , or something else to try the Keppra  1/4:  discussed with patient, wish to wait until primary team is back on Monday to make major medication adjustments/add Keppra - 1/5: tone remains severe, again patient wishes major medication adjustments per primary team, but could likely retry Zanaflex  given confusion has not  improved 1/6- will add keppra  250 mg BID and reduce MS Contin  to 1x/day in evening. To reduce sedation during day.  1/7- pain more in AM yesterday, but did better overnight- let's see if Keppra  helps or hurts?will d/w SCI docs to see what opther options there might be 1/8- Oxy was what made pt confused yesterday per wife- wil sop and change to Norco- less chance of confusion 1/9- very sleepy this AM- will d/w nursing why more today? 1/10- delay in getting pain meds today and Vlaium- d/w PA 1/11 continues to have poor body wide pain, sounds like possibly dantrolene  may be helping however difficult to assess as patient is more agitated today.  Does not appear this is making him sedated thus far.  Will see if we can get him different mattress 1/12 patient indicates Dantrium  is helping, continue current regimen 4. Mood/Behavior/Sleep: LCSW to follow for evaluation and support.              -antipsychotic agents: N/A  -12/27--trazodone  and melatonin helpful 12/28 doing better with earlier scheduling of trazodone  and melatonin 1/2- having some confusion at night- not sure if due to Zanaflex , which I sotpped, but will give Seroquel  25 mg at bedtime to help as well -d/c trazodone  1/3- will increase Seorquel to 50 mg at bedtime- took clothes off last night- got confused, but calmer, per fiance'.  1/4: Increase seroquel  to 75 mg nightly, and retimed to 8 PM 1-5: DC melatonin due to vivid dreams.  No active hallucinations last night, so continue current dose of Seroquel ..  Patient endorsed passive SI yesterday, resolved today, he is agreeable  to immediately notifying physician and staff if this recurs--psychiatry not consulted due to patient being no active risk of self-harm and with recent start of duloxetine  1/6- will increase Duloxetine  to 60 mg at bedtime- since sedated during day, to reduce daytime sedation 1/9- will make seroquel  75 mg at bedtime PRN and see if that helps am sedation- I'm also wondering if increase in Duloxetine  making him sleepier- will check on him again today.  1/11 appears awake and alert, continue current regimen for now 5. Neuropsych/cognition: This patient is? capable of making decisions on his own behalf. 6. Skin/Wound Care: Routine pressure relief measures.   -has developed left heel sore d/t tone, struggles to fit in [facility]  -heel dressed, ordered prevalon boots so that he's better able to tolerate wear-- -12/29 prevalon boots in place this am--tolerating -1/4: right heel ?DTI on exam--more consistent with skin color variation.  Other wounds appear improving/stable.  7. Hyponatremia: continue to monitor Na weekly  12/30- Na 139  8. Klebsiella aerogenes bacteremia/sepsis /UTI: levaquin   -abx completed per ID recs--7 days  9. Neurogenic bowel: Continue Senna S 2 tabs BID. Miralax  daily-->has been incontinent of bowel and question due to antibiotics and/or laxatives --Suppository daily after supper-->has refused for past 3 nights.  --would prefer enema three times a week as at home. Have suggested using mini enema 1/7- LBM last night with Enemeze 1/8- refused bowel program tonight- wants a break 1/9- Refused bowel program again last night- concerned his constipation will get him more spastic 1/11- fiance did bowel program last night 1/11 continue to encourage regular bowel program, LBM 1/9 1/12 LBM with bowel program yesterday 10. Neurogenic bladder: Foley was replaced on acute-->will d/c and start bladder program. (patient would like to resume) --Monitor voiding with PVR checks             --use  coude  cath and monitor for recurrent hematuria             --continue flomax \  12/29 cath volumes generally in range until this morning--continue current schedule  12/31- Cath volumes looking good- cathing q4 hours  1/1- per fiance' Draining slow/caths- will speak to nursing- on max dose of Flomax  to try and get pt to void.   1/2- Pt's cathing is doing well- went 1 time 5 hours 45 minutes last night- but fiance concerned they are waiting for her to do- will need help with this.   1/3-5 cath volumes going well --continue cathing  1/6- will change caths to q4 hours scheduled to reduce spasticity  1/7- fiance doing most of caths  1/11 cath volumes appear overall appropriate  1/12 IC volume 650 and 600 this morning, may have waited a little too long between ICU overnight 11. Neuropathy/at level SCI pain: Was on Cymbalta  PTA-resumed at 20 mg at bedtime, continue--increased to 30mg  daily effective 12/28  12/30- doesn't want Cymbalta  for pain, but willing to take for mood  1-5: See #4 above  1/6- will increase Duloxetine  to 60 mg at bedtime for pain/mood.  12. Abd pain/gas/at level SCI pain  1/6- will add Gasx 160 mg QID and also Keppra  250 mg BID   1/7- No major changes today- will monitor closely  1/10- will stop Keppra -no improvement     LOS: 19 days A FACE TO FACE EVALUATION WAS PERFORMED  Murray Collier 07/07/2023, 1:58 PM

## 2023-07-07 NOTE — Progress Notes (Signed)
 Physical Therapy Session Note  Patient Details  Name: Leon Rodriguez MRN: [id number] Date of Birth: 1966-03-25  Today's Date: 07/07/2023 PT Individual Time: 8552-8477 PT Individual Time Calculation (min): 35 min   Short Term Goals: Week 3:  PT Short Term Goal 1 (Week 3): Pt will verbalize pressure relief with cueing PT Short Term Goal 2 (Week 3): Pt will perform SBT with assist of 1 PT Short Term Goal 3 (Week 3): Pt will initiate care direction with transfers.  Skilled Therapeutic Interventions/Progress Updates: P presents sitting in TIS reclined back and reluctantly agreeable to therapy, c/o pain/soreness B hips and shoulders.  Pt wheeled to standing frame but unable to perform 2/2 wideness of TIS.  Pt then wheeled to main gym into // bars.  Pt attempted sit to stand in bars w/ max A + 2, blocking of LES w/ Airex cushion, pillow between knees.  Pt performed LAQ w/ AAROM.  Pt returned to room and required total A for placement of SB.  Pt required max A for SB transfer, blocking knees, +2 for standby and completion of turn.  Pt attempted to scoot back into bed, but unable.  Pt required max A for LES sit to sidelying, log roll technique.  Pt remained semi-reclined in bed w/ S.O present, all needs in reach.  Missed time of 10' 2/2 pain/fatigue.     Therapy Documentation Precautions:  Precautions Precautions: Fall, Cervical Precaution Comments: hx of many falls, severe B LE hypertonia with muscle spasms, verbally went through cervical precautions throughout session Required Braces or Orthoses: Cervical Brace Cervical Brace: Hard collar, Other (comment) (OOB) Restrictions Weight Bearing Restrictions Per Provider Order: Yes Other Position/Activity Restrictions: cervical precautions General: PT Amount of Missed Time (min): 10 Minutes PT Missed Treatment Reason: Patient fatigue;Pain Vital Signs:   Pain: 10/10     Therapy/Group: Individual Therapy  Natanael Saladin P Hilary Milks 07/07/2023, 3:52 PM

## 2023-07-07 NOTE — Progress Notes (Signed)
 Occupational Therapy Session Note  Patient Details  Name: Leon Rodriguez MRN: [id number] Date of Birth: 11/14/1965  Today's Date: 07/07/2023 OT Individual Time: 9054-8984 OT Individual Time Calculation (min): 30 min  and Today's Date: 07/07/2023 OT Missed Time: 15 Minutes Missed Time Reason: Other (comment) (SO performing I/O)   Short Term Goals: Week 4:  OT Short Term Goal 1 (Week 4): Pt will perform TB transfers with mod A+1 on a level surface OT Short Term Goal 2 (Week 4): Pt will perform LB dressing with max A at bed level OT Short Term Goal 3 (Week 4): Pt will complete UB dressing with min A seated EOB or in w/c  Skilled Therapeutic Interventions/Progress Updates:    Pt missed 15 mins at beginning of session 2/2 SO competing I/O for pt. OT intervention with focus on bed mobility to facilitate donning pants and shoes at bed level, and TB tranfser to TIS w/c. Pt required change of incontinence brief before donning pants. Rolling R/L in bed with min A for BLE mgmt. Pt dependent for donning pants and socks/shoes. Multiple sets of rolling R/L required to complete tasks. Supine>sit EOB with max A for BLE mgmt and mod A for sidelying>sit EOB. Sitting balance EOB with min A. TB transfer to TIS w/c with max A+2 2/2 increase in BLE and trunk spasms. Pt remained in w/c with all needs within reach. SO present.   Therapy Documentation Precautions:  Precautions Precautions: Fall, Cervical Precaution Comments: hx of many falls, severe B LE hypertonia with muscle spasms, verbally went through cervical precautions throughout session Required Braces or Orthoses: Cervical Brace Cervical Brace: Hard collar, Other (comment) (OOB) Restrictions Weight Bearing Restrictions Per Provider Order: Yes Other Position/Activity Restrictions: cervical precautions General: General OT Amount of Missed Time: 15 Minutes Pain: Pt with increased BLE and trunk spasms during this session requiring rest period to  employ relaxation strategies.    Therapy/Group: Individual Therapy  Maritza Debby Mare 07/07/2023, 10:20 AM

## 2023-07-07 NOTE — Progress Notes (Signed)
 Occupational Therapy Session Note  Patient Details  Name: Leon Rodriguez MRN: [id number] Date of Birth: 1965-12-09  Today's Date: 07/07/2023 OT Individual Time: 1345-1430 OT Individual Time Calculation (min): 45 min    Short Term Goals: Week 4:  OT Short Term Goal 1 (Week 4): Pt will perform TB transfers with mod A+1 on a level surface OT Short Term Goal 2 (Week 4): Pt will perform LB dressing with max A at bed level OT Short Term Goal 3 (Week 4): Pt will complete UB dressing with min A seated EOB or in w/c  Skilled Therapeutic Interventions/Progress Updates:    Pt sitting EOB upon arrival with SO present. TB transfer to w/c with max A+2 this afternoon. Spasms more intense and frequent this afternoon. Transition to day room for sitting balance and lateral lean block practice. Pt with rounded shoulders and slumped posture when sitting unsupported 2/2 decreased core strength/function. Pt practiced forward leaning with BUE resting on bil thighs and pushing back. Pt practiced lateral leans and pushing up. Educated pt on importance of slow and controlled movements vs quick transitional movements. Pt with periodic spasms throughout session followed by rest breaks. Pt returned to room and remained seated in TIS with SO present.   Therapy Documentation Precautions:  Precautions Precautions: Fall, Cervical Precaution Comments: hx of many falls, severe B LE hypertonia with muscle spasms, verbally went through cervical precautions throughout session Required Braces or Orthoses: Cervical Brace Cervical Brace: Hard collar, Other (comment) (OOB) Restrictions Weight Bearing Restrictions Per Provider Order: Yes Other Position/Activity Restrictions: cervical precautions   Pain:  Ongoing trunk and BLE spasms (unrated); meds admin prior to therapy and relaxation strategies  Therapy/Group: Individual Therapy  Maritza Debby Mare 07/07/2023, 2:39 PM

## 2023-07-07 NOTE — Progress Notes (Addendum)
 Pt wife preformed I/O cath with a output of urine. Pt wife also preformed digstim/bowel program. She preformed multiple rounds of dig stim every 20 minutes x3 as well as enema with small amount of stool return.   Rayburn Ma

## 2023-07-08 DIAGNOSIS — R252 Cramp and spasm: Secondary | ICD-10-CM | POA: Diagnosis not present

## 2023-07-08 DIAGNOSIS — N319 Neuromuscular dysfunction of bladder, unspecified: Secondary | ICD-10-CM | POA: Diagnosis not present

## 2023-07-08 DIAGNOSIS — G959 Disease of spinal cord, unspecified: Secondary | ICD-10-CM | POA: Diagnosis not present

## 2023-07-08 DIAGNOSIS — K592 Neurogenic bowel, not elsewhere classified: Secondary | ICD-10-CM | POA: Diagnosis not present

## 2023-07-08 LAB — BASIC METABOLIC PANEL
Anion gap: 5 (ref 5–15)
BUN: 9 mg/dL (ref 6–20)
CO2: 29 mmol/L (ref 22–32)
Calcium: 9.5 mg/dL (ref 8.9–10.3)
Chloride: 105 mmol/L (ref 98–111)
Creatinine, Ser: 0.79 mg/dL (ref 0.61–1.24)
GFR, Estimated: >60 mL/min (ref >60)
Glucose, Bld: 97 mg/dL (ref 70–99)
Potassium: 3.9 mmol/L (ref 3.5–5.1)
Sodium: 139 mmol/L (ref 135–145)

## 2023-07-08 LAB — CBC
HCT: 31.1 % — ABNORMAL LOW (ref 39.0–52.0)
Hemoglobin: 10.9 g/dL — ABNORMAL LOW (ref 13.0–17.0)
MCH: 33.7 pg (ref 26.0–34.0)
MCHC: 35 g/dL (ref 30.0–36.0)
MCV: 96.3 fL (ref 80.0–100.0)
Platelets: 235 10*3/uL (ref 150–400)
RBC: 3.23 MIL/uL — ABNORMAL LOW (ref 4.22–5.81)
RDW: 12.8 % (ref 11.5–15.5)
WBC: 5.6 10*3/uL (ref 4.0–10.5)
nRBC: 0 % (ref 0.0–0.2)

## 2023-07-08 MED ORDER — DANTROLENE SODIUM 25 MG PO CAPS
25.0000 mg | ORAL_CAPSULE | Freq: Two times a day (BID) | ORAL | Status: DC
  Administered 2023-07-08 – 2023-07-12 (×9): 25 mg via ORAL
  Filled 2023-07-08 (×10): qty 1

## 2023-07-08 NOTE — Progress Notes (Signed)
 PROGRESS NOTE   Subjective/Complaints: Pt sitting in bed, wife cleaning him up after bm. Dantrium  helping spasms. Is sleepy from meds but willing to tolerate. He and wife asked about MRI results which was done on Saturday!  ROS: Patient denies fever, rash, sore throat, blurred vision, dizziness, nausea, vomiting, diarrhea, cough, shortness of breath or chest pain, headache, or mood change.   Objective:   MR THORACIC SPINE WO CONTRAST Result Date: 07/06/2023 CLINICAL DATA:  Spinal cord injury.  Severe spasms EXAM: MRI THORACIC SPINE WITHOUT CONTRAST TECHNIQUE: Multiplanar, multisequence MR imaging of the thoracic spine was performed. No intravenous contrast was administered. COMPARISON:  MRI 06/11/2023 FINDINGS: Technical Note: Despite efforts by the technologist and patient, motion artifact is present on today's exam and could not be eliminated. This reduces exam sensitivity and specificity. Alignment:  Unchanged alignment.  No traumatic listhesis. Vertebrae: Susceptibility artifact from ACDF hardware is present at the C6-T1 levels. Thoracic vertebral body heights are maintained. No acute fracture. No evidence of discitis. No suspicious marrow replacing bone lesions. Cord: Suspected increased signal within the lower cervical cord as seen on the previous study, evaluation of which is significantly degraded by motion artifact. No convincing sites of thoracic cord signal abnormality within the limitations of this motion degraded examination. Paraspinal and other soft tissues: Negative. Disc levels: Severe canal stenosis at the C7-T1 level. Additional sites of mild to moderate canal stenosis including at the T1-T2, T2-T3, T4-T5, T6-T7 levels is unchanged from prior. Similar degree of bilateral foraminal stenosis, notably involving the T1-T2 and T11-T12 levels. IMPRESSION: 1. No convincing sites of thoracic cord signal abnormality within the limitations  of this motion degraded examination. Suspected increased signal within the lower cervical cord as seen on the previous study, evaluation of which is significantly degraded by motion artifact. 2. No new or acute osseous abnormality of the thoracic spine. 3. Severe canal stenosis at the C7-T1 level. Additional sites of mild to moderate canal stenosis including at the T1-T2, T2-T3, T4-T5, T6-T7 levels is unchanged from prior. Electronically Signed   By: Mabel Converse D.O.   On: 07/06/2023 13:00   Recent Labs    07/08/23 0527  WBC 5.6  HGB 10.9*  HCT 31.1*  PLT 235      Recent Labs    07/08/23 0527  NA 139  K 3.9  CL 105  CO2 29  GLUCOSE 97  BUN 9  CREATININE 0.79  CALCIUM 9.5       Intake/Output Summary (Last 24 hours) at 07/08/2023 1212 Last data filed at 07/08/2023 0908 Gross per 24 hour  Intake 480 ml  Output 2050 ml  Net -1570 ml        Physical Exam: Vital Signs Blood pressure 102/77, pulse 85, temperature 98.2 F (36.8 C), temperature source Oral, resp. rate 18, height 5' 9 (1.753 m), weight 93.9 kg, SpO2 100%.      Constitutional: No distress . Vital signs reviewed. HEENT: NCAT, EOMI, oral membranes moist Neck: supple Cardiovascular: RRR without murmur. No JVD    Respiratory/Chest: CTA Bilaterally without wheezes or rales. Normal effort    GI/Abdomen: BS +, non-tender, non-distended Ext: no clubbing, cyanosis, or edema Psych: pleasant,  flat but cooperative  Skin- healing blister on top of L foot-stable -Area of discoloration darkened skin on right heel, does not appear to be DTI  Neurological:  Fairly awake and alert, oriented x 3.  Follows all simple commands.  1-5: Left lower extremity MAS 2 knee extensors, MAS 1 plantar flexors, MAS 2 adductors Right lower extremity MAS 3-4 knee extensors, MAS 3 plantarflexors, MAS 3 adductors. Fairly consistent with prior exams 1/13 Strength: Moving bilateral upper extremities antigravity against resistance;  bilateral lower extremities not quite antigravity in hip flexion Sensation: Altered below T4  Prior:  Comments: Ue's 5-/5 throughout on exam B/L RLE- HF 2-/5; KE 2-/5; DF 4-/5; and PF 4-/5 LLE- HF 2-/5; KE 2-/5; DF 2/5; PF 2/5- spasticity still present Sensory level at T4 and below-      Assessment/Plan: 1. Functional deficits which require 3+ hours per day of interdisciplinary therapy in a comprehensive inpatient rehab setting. Physiatrist is providing close team supervision and 24 hour management of active medical problems listed below. Physiatrist and rehab team continue to assess barriers to discharge/monitor patient progress toward functional and medical goals  Care Tool:  Bathing    Body parts bathed by patient: Face   Body parts bathed by helper: Right arm, Left arm, Chest, Abdomen, Front perineal area, Buttocks, Right upper leg, Left upper leg, Right lower leg, Left lower leg (wife assisted pt with bath prior to OT evaluation)     Bathing assist Assist Level: Total Assistance - Patient < 25%     Upper Body Dressing/Undressing Upper body dressing   What is the patient wearing?: Pull over shirt    Upper body assist Assist Level: Minimal Assistance - Patient > 75%    Lower Body Dressing/Undressing Lower body dressing      What is the patient wearing?: Pants, Incontinence brief     Lower body assist Assist for lower body dressing: Total Assistance - Patient < 25%     Toileting Toileting    Toileting assist Assist for toileting: 2 Helpers     Transfers Chair/bed transfer  Transfers assist     Chair/bed transfer assist level: 2 Helpers     Locomotion Ambulation   Ambulation assist   Ambulation activity did not occur: Safety/medical concerns (limited by extreme tone)          Walk 10 feet activity   Assist  Walk 10 feet activity did not occur: Safety/medical concerns        Walk 50 feet activity   Assist Walk 50 feet with 2 turns  activity did not occur: Safety/medical concerns         Walk 150 feet activity   Assist Walk 150 feet activity did not occur: Safety/medical concerns         Walk 10 feet on uneven surface  activity   Assist Walk 10 feet on uneven surfaces activity did not occur: Safety/medical concerns         Wheelchair     Assist Is the patient using a wheelchair?: Yes Type of Wheelchair: Manual Wheelchair activity did not occur: Safety/medical concerns  Wheelchair assist level: Dependent - Patient 0% Max wheelchair distance: 150    Wheelchair 50 feet with 2 turns activity    Assist    Wheelchair 50 feet with 2 turns activity did not occur: Safety/medical concerns   Assist Level: Dependent - Patient 0%   Wheelchair 150 feet activity     Assist  Wheelchair 150 feet activity did not occur:  Safety/medical concerns   Assist Level: Dependent - Patient 0%   Blood pressure 102/77, pulse 85, temperature 98.2 F (36.8 C), temperature source Oral, resp. rate 18, height 5' 9 (1.753 m), weight 93.9 kg, SpO2 100%.  Medical Problem List and Plan: 1. Functional deficits secondary to Cervical myelopathy s/p ACDF- back after sepsis             -patient may  shower             -ELOS/Goals: 3-4 weeks- min A hopefully- likely mod to max A Grounds pass ordered  D/c 07/17/23 -have counseled pt/wife about why CT myelo wasn't performed. Discussed spasticity.  D/w pt and fiance that [facility] doesn't think he could get ITB pump due to lack of flow of CSF at distal end of Spinal cord- where he needs it most/due to more spasticity in legs than arms Con't CIR PT and OT Other physicians I spoke to suggest restrying Dantrolene - they don't usually see if cause a lot of sedation. Since so helpful in many cases, will d/w pt/fiance' again.  Reinforced he is NOT a candidate for ITB pump D/W Dr Carollee and Dr Vona at [facility] about possible SDR- as well as getting thoracic MRI to rul out another  cause of spasticity being so bad.  1/11 T spine MRI completed today, does not appear to have been done under anesthesia some motion artifacts, continued canal stenosis C7-T1 noted severe and other sites of mild to moderate stenosis noted in thoracic cord however overall unchanged from prior Discussed results of T-spine MRI with patient 1/13 reviewed results AGAIN with pt/wife. The acted as if nobody had talked to them about it before.  2.  Antithrombotics: -DVT/anticoagulation:  Pharmaceutical: Lovenox              -antiplatelet therapy: N/A 3. Pain Management/spasticity:  MS contin  15 mg bid, Oxycodone  prn. Lidoderm  patch for local measures. Tylenol  650 mg qid.             --bilateral hip pain with attempts at rolling.              --on Baclofen  20 mg qid, Valium  5 mg qid with Zanaflex  4 mg qid prn for spasticity.  -12/27 Started Duloxetine  20 mg at bedtime 12/25- titrate up every 3-4 days as tolerated  -bumped baclofen  to 25mg  qid, consider scheduling tizanidine   -added gabapentin  100mg  tid for injury level/chest wall pain/spasticity 12/29- pushing antispasmodics given the severity of his spasticity. Continue to titrate neuro-sedation permitting -increased baclofen  to 30mg  qid 12/28 -continue valium , consider scheduled tizanidine  also -increased duloxetine  (now in am) to 30mg  -pt refuses to take gabapentin  as he doesn't want more potentially sedating medication--I stopped it yesterday 12/30- will add Dantrolene  50 mg at bedtime- explained it's gradual onset- will increase q5 days 12/31- will stop Dantrolene - so sleepy this AM- calling Dr Carollee to see what he thinks about placing ITB pump sooner than later? 1/1- educated fiance and pt that cannot do ITB pump due to lack of flow of CSF- p[er [facility]- reason couldn't get LP for CT myelogram Will wait for now on Keppra , but if sedation gets better, can try 1/2- sinc ehaving confusion, will stop Zanaflex - in rare cases, can cause hallucinations (I've  sene in other patients) and will give more baclofen  prn 10 mg TID prn 1/3- tone if anything looking worse- don't know what else can do right now except Keppra , but will need to reduce Valium , or something else to try the  Keppra  1/4:  discussed with patient, wish to wait until primary team is back on Monday to make major medication adjustments/add Keppra - 1/5: tone remains severe, again patient wishes major medication adjustments per primary team, but could likely retry Zanaflex  given confusion has not improved 1/6- will add keppra  250 mg BID and reduce MS Contin  to 1x/day in evening. To reduce sedation during day.  1/7- pain more in AM yesterday, but did better overnight- let's see if Keppra  helps or hurts?will d/w SCI docs to see what opther options there might be 1/8- Oxy was what made pt confused yesterday per wife- wil sop and change to Norco- less chance of confusion 1/9- very sleepy this AM- will d/w nursing why more today? 1/10- delay in getting pain meds today and Vlaium- d/w PA 1/11 continues to have poor body wide pain, sounds like possibly dantrolene  may be helping however difficult to assess as patient is more agitated today.  Does not appear this is making him sedated thus far.  Will see if we can get him different mattress 1/13 pt feels dantrium  is helping. Will try day time dose as well.  4. Mood/Behavior/Sleep: LCSW to follow for evaluation and support.              -antipsychotic agents: N/A  -12/27--trazodone  and melatonin helpful 12/28 doing better with earlier scheduling of trazodone  and melatonin 1/2- having some confusion at night- not sure if due to Zanaflex , which I sotpped, but will give Seroquel  25 mg at bedtime to help as well -d/c trazodone  1/3- will increase Seorquel to 50 mg at bedtime- took clothes off last night- got confused, but calmer, per fiance'.  1/4: Increase seroquel  to 75 mg nightly, and retimed to 8 PM 1-5: DC melatonin due to vivid dreams.  No active  hallucinations last night, so continue current dose of Seroquel ..  Patient endorsed passive SI yesterday, resolved today, he is agreeable to immediately notifying physician and staff if this recurs--psychiatry not consulted due to patient being no active risk of self-harm and with recent start of duloxetine  1/6- will increase Duloxetine  to 60 mg at bedtime- since sedated during day, to reduce daytime sedation 1/9- will make seroquel  75 mg at bedtime PRN and see if that helps am sedation- I'm also wondering if increase in Duloxetine  making him sleepier- will check on him again today.  1/13 fairly alert. Observe with addition of day time dantrium  5. Neuropsych/cognition: This patient is? capable of making decisions on his own behalf. 6. Skin/Wound Care: Routine pressure relief measures.   -has developed left heel sore d/t tone, struggles to fit in [facility]  -heel dressed, ordered prevalon boots so that he's better able to tolerate wear-- -12/29 prevalon boots in place this am--tolerating -1/4: right heel ?DTI on exam--more consistent with skin color variation.  Other wounds appear improving/stable.  7. Hyponatremia: continue to monitor Na weekly  12/30- Na 139  8. Klebsiella aerogenes bacteremia/sepsis /UTI: levaquin   -abx completed per ID recs--7 days  9. Neurogenic bowel: Continue Senna S 2 tabs BID. Miralax  daily-->has been incontinent of bowel and question due to antibiotics and/or laxatives --Suppository daily after supper-->has refused for past 3 nights.  --would prefer enema three times a week as at home. Have suggested using mini enema 1/7- LBM last night with Enemeze 1/8- refused bowel program tonight- wants a break 1/9- Refused bowel program again last night- concerned his constipation will get him more spastic 1/11- fiance did bowel program last night 1/11 continue to encourage  regular bowel program, LBM 1/9 1/13 LBM with bowel program 1/11--no results last night--no changes today 10.  Neurogenic bladder: Foley was replaced on acute-->will d/c and start bladder program. (patient would like to resume) --Monitor voiding with PVR checks             --use coude cath and monitor for recurrent hematuria             --continue flomax \  12/29 cath volumes generally in range until this morning--continue current schedule  12/31- Cath volumes looking good- cathing q4 hours  1/1- per fiance' Draining slow/caths- will speak to nursing- on max dose of Flomax  to try and get pt to void.   1/2- Pt's cathing is doing well- went 1 time 5 hours 45 minutes last night- but fiance concerned they are waiting for her to do- will need help with this.   1/3-5 cath volumes going well --continue cathing  1/6- will change caths to q4 hours scheduled to reduce spasticity  1/7- fiance doing most of caths  1/11 cath volumes appear overall appropriate  1/12 IC volume 650 and 600 this morning, may have waited a little too long between ICU overnight 11. Neuropathy/at level SCI pain: Was on Cymbalta  PTA-resumed at 20 mg at bedtime, continue--increased to 30mg  daily effective 12/28  12/30- doesn't want Cymbalta  for pain, but willing to take for mood  1-5: See #4 above  1/6- will increase Duloxetine  to 60 mg at bedtime for pain/mood.  12. Abd pain/gas/at level SCI pain  1/6- will add Gasx 160 mg QID and also Keppra  250 mg BID   1/7- No major changes today- will monitor closely  1/10- will stop Keppra -no improvement     LOS: 20 days A FACE TO FACE EVALUATION WAS PERFORMED  Arthea ONEIDA Gunther 07/08/2023, 12:12 PM

## 2023-07-08 NOTE — Progress Notes (Signed)
 Bowel program to be done by wife per pt request. Wife states she will do at a later time.   Rito Ehrlich, LPN

## 2023-07-08 NOTE — Progress Notes (Addendum)
 Patient ID: Leon Rodriguez, male   DOB: Jan 31, 1966, 58 y.o.   MRN: [id number]  SW faxed PCS referral again to [facility] [employer] [facility] Case Management Dept [phone].  [phone] received fax from Diane Harrington [phone]) reporting that an attempt to make contact with pt was unsuccessful. SW left message providing contact information for s/o and requested her follow-up with her.   [id number] spoke with pt s/o Leon Rodriguez to discuss above and gave her contact information. She will follow-up.   Graeme Jude, MSW, LCSW Office: [phone] Cell: [phone] Fax: [phone]

## 2023-07-08 NOTE — Progress Notes (Signed)
 Physical Therapy Session Note  Patient Details  Name: Leon Rodriguez MRN: [id number] Date of Birth: 1966-06-23  Today's Date: 07/08/2023 PT Missed Time: 60 Minutes Missed Time Reason: Patient fatigue;Pain;Patient unwilling to participate  Short Term Goals: Week 3:  PT Short Term Goal 1 (Week 3): Pt will verbalize pressure relief with cueing PT Short Term Goal 2 (Week 3): Pt will perform SBT with assist of 1 PT Short Term Goal 3 (Week 3): Pt will initiate care direction with transfers.  Skilled Therapeutic Interventions/Progress Updates:      Pt supine in bed upon arrival.Pt agreeable to therapy. Pt reports 10/10 B LE pain 2/2 muscle spasms, pt reports it has been the past few days have been rough with MRI, and attempting to stand this AM. Pt reports he just got comfortable and is refusing therapy today. Pt offered to donn heat and perform bed level therex pt refusing. Pt missed 60 min 2/2 refusal.   Therapy Documentation Precautions:  Precautions Precautions: Fall, Cervical Precaution Comments: hx of many falls, severe B LE hypertonia with muscle spasms, verbally went through cervical precautions throughout session Required Braces or Orthoses: Cervical Brace Cervical Brace: Hard collar, Other (comment) (OOB) Restrictions Weight Bearing Restrictions Per Provider Order: Yes Other Position/Activity Restrictions: cervical precautions General:   Vital Signs: Therapy Vitals Temp: 98.2 F (36.8 C) Temp Source: Oral Pulse Rate: 85 Resp: 18 BP: 102/77 Oxygen Therapy SpO2: 100 % O2 Device: Room Air Pain:   Mobility:   Locomotion :    Trunk/Postural Assessment :    Balance:   Exercises:   Other Treatments:      Therapy/Group: Individual Therapy  [name] 07/08/2023, 7:47 AM

## 2023-07-08 NOTE — Progress Notes (Signed)
 Occupational Therapy Session Note  Patient Details  Name: Leon Rodriguez MRN: [id number] Date of Birth: April 04, 1966  Today's Date: 07/08/2023 OT Individual Time: 0930-1040 OT Individual Time Calculation (min): 70 min    Short Term Goals: Week 4:  OT Short Term Goal 1 (Week 4): Pt will perform TB transfers with mod A+1 on a level surface OT Short Term Goal 2 (Week 4): Pt will perform LB dressing with max A at bed level OT Short Term Goal 3 (Week 4): Pt will complete UB dressing with min A seated EOB or in w/c  Skilled Therapeutic Interventions/Progress Updates:    OT intervention with focus on practicing sit<>stand in [place]. Pt's SO assisted as part of education. Sit<>stand with max A+2 x 3 but unable to complete 4th attempt. Discussed and demonstrated manual hoyer lift. Recommended use be with two persons. SO states it will only be her providing care for pt. SO requested use of Stedy. Will require +2 if pt's status does not improve. SB transfer back to bed with +2. Pt reamined in bed with all needs within reach. SO present.   Therapy Documentation Precautions:  Precautions Precautions: Fall, Cervical Precaution Comments: hx of many falls, severe B LE hypertonia with muscle spasms, verbally went through cervical precautions throughout session Required Braces or Orthoses: Cervical Brace Cervical Brace: Hard collar, Other (comment) (OOB) Restrictions Weight Bearing Restrictions Per Provider Order: Yes Other Position/Activity Restrictions: cervical precautions   Pain:  Pt with ongoing spasms and associated pain; meds admin prior to therapy  Therapy/Group: Individual Therapy  Maritza Debby Mare 07/08/2023, 10:44 AM

## 2023-07-09 DIAGNOSIS — G959 Disease of spinal cord, unspecified: Secondary | ICD-10-CM | POA: Diagnosis not present

## 2023-07-09 MED ORDER — SORBITOL 70 % SOLN
30.0000 mL | Freq: Once | Status: AC
  Administered 2023-07-09: 30 mL via ORAL
  Filled 2023-07-09: qty 30

## 2023-07-09 MED ORDER — POLYETHYLENE GLYCOL 3350 17 G PO PACK
17.0000 g | PACK | Freq: Every day | ORAL | Status: DC
  Administered 2023-07-09 – 2023-07-10 (×2): 17 g via ORAL
  Filled 2023-07-09 (×4): qty 1

## 2023-07-09 NOTE — Progress Notes (Signed)
 Patient ID: Leon Rodriguez, male   DOB: Aug 10, 1965, 58 y.o.   MRN: [id number]  SW sent demo sheet to [facility] medical.  [name], MSW, LCSW Office: [phone] Cell: [phone] Fax: [phone]

## 2023-07-09 NOTE — Progress Notes (Signed)
 Physical Therapy Session Note  Patient Details  Name: Leon Rodriguez MRN: [id number] Date of Birth: 04-01-1966  Today's Date: 07/09/2023 PT Individual Time: 1135-1205, 1330-1430 PT Individual Time Calculation (min): 30 min, 60 min   Short Term Goals: Week 3:  PT Short Term Goal 1 (Week 3): Pt will verbalize pressure relief with cueing PT Short Term Goal 2 (Week 3): Pt will perform SBT with assist of 1 PT Short Term Goal 3 (Week 3): Pt will initiate care direction with transfers.  Skilled Therapeutic Interventions/Progress Updates:    Session 1: pt received in bed and agreeable to therapy. Pt noted to display s/s of pain with movement and spasms, unrated. premedicated. Rest and positioning provided as needed. Bed mobiltiy with max a for BLE management d/t tone. Sitting balance with CGA-supervision. Therapist assisted with opening BLE into appropriate position for transfer, tot a against tone. slideboard transfer with mod a x 2. Pt remained in TIS at end of session. Discussed option for TIS w/c, pt states liberty chair was too tight, but he is agreeable to trying larger one. This therapist consulted with [employer] about getting a larger loaner chair to trial. Pt was left with needs in reach and his wife present.   Session 2: Pt seated in w/c on arrival and agreeable to therapy. Pt with unrated pain from spasms throughout session, premedicated. Rest and positioning provided as needed. Reports shoulder pain at times, addressed with manual therapy and therEx.   Session focused on using standing frame for tone management. Pt able to tolerate 3 x 3-4 minutes with assist to maintain LE alignment throughout. While standing, pt participated in gentle scapular motion for pain management and meaningful conversation. Therapist put in chaplain referral at pt request for support and to discuss coping with his situation, as he states he has often felt hopeless in his situation.   Pt returned to room and to bed  with mod x 2 slideboard transfer and supervision sit>supine. Pt was left with all needs in reach and alarm active.   Therapy Documentation Precautions:  Precautions Precautions: Fall, Cervical Precaution Comments: hx of many falls, severe B LE hypertonia with muscle spasms, verbally went through cervical precautions throughout session Required Braces or Orthoses: Cervical Brace Cervical Brace: Hard collar, Other (comment) (OOB) Restrictions Weight Bearing Restrictions Per Provider Order: Yes Other Position/Activity Restrictions: cervical precautions General:       Therapy/Group: Individual Therapy  Xayden Linsey C Dailin Sosnowski 07/09/2023, 12:18 PM

## 2023-07-09 NOTE — Patient Care Conference (Signed)
 Inpatient RehabilitationTeam Conference and Plan of Care Update Date: 07/09/2023   Time: 11:10 AM    Patient Name: Leon Rodriguez      Medical Record Number: [id number]  Date of Birth: May 11, 1966 Sex: Male         Room/Bed: 5T95R/5T95R-98 Payor Info: Payor: [place] MEDICAID PREPAID HEALTH PLAN / Plan: [place] MEDICAID HEALTHY BLUE / Product Type: *No Product type* /    Admit Date/Time:  06/18/2023  3:34 PM  Primary Diagnosis:  Cervical myelopathy [facility])  Hospital Problems: Principal Problem:   Cervical myelopathy (HCC) Active Problems:   Adjustment disorder with depressed mood    Expected Discharge Date: Expected Discharge Date: 07/17/23  Team Members Present: Physician leading conference: Dr. Duwaine Barrs Social Worker Present: Graeme Jude, LCSWA Nurse Present: Barnie Ronde, RN;Byrl Latin [place], RN PT Present: Schuyler Batter, PT OT Present: Delon Sharps, OT;Charlena Cha, COTA PPS Coordinator present : Burnard Mealing, OT     Current Status/Progress Goal Weekly Team Focus  Bowel/Bladder   Pt is currently q4 i/o cath and on a bowel program. LBM 07/08/23   Will regain continence   Provide education to pt/family on i/o cath and bowel program.    Swallow/Nutrition/ Hydration               ADL's   bathing at bed level-min/mod A; LB dressing-dependent; UB dressing-min/mod A; TB transfers with mod-max A +2 for safety; spasms/flexor/extensor tone inhibiting function and advancement towards goals; education started with SO   mod A overall; bathing-min A   BADLs, education, pain mgmt, transfers    Mobility   limited by tone, family education ongoing for hoyer, mod-max +2 for all mobility   mod A  transfers, family ed, tone management    Communication                Safety/Cognition/ Behavioral Observations               Pain   Denies pain at this time   Will be free from pain   Assess pt for pain qshift/prn and administer pain medication per  orders.    Skin   Skin is currently intact   Will maintain skin intergrity with no breakdown  Assess skin qshift/prn and provide education to prevent skin breakdown      Discharge Planning:  D/c to home with s/o [name]. She is the only caregiver. Reports her dtr can help PRN some evenings. Disability referral sent to [facility]. PCS referral sent from previous admission to insurance- family needs to follow-up to schedule assessment. [facility] for HHPT/OT/aide. Will confirm DME needs. SW will confirm there are no barriers to discharge.   Team Discussion: Cervical myelopathy. Severe spasticity. Working on pain management. Question if surgery is an option. Confusion. Addressing constipation. Insomnia due to pain/spasms. Girlfriend staying at [facility] because he refuses to cath or do bowel program if she doesn't stay.  Takes enemeez every other night. Working with girlfriend with hoyer lift but requires two people and she is only one to assist with care at home. Continue with ongoing education, transfers, pain and tone management.  Patient on target to meet rehab goals: no, adjusting goals to Mod A +2  with discharge date of 07/17/23  *See Care Plan and progress notes for long and short-term goals.   Revisions to Treatment Plan:  Dr Carollee to see patient.  Started Dantrolene . Monitor labs and  Teaching Needs: Medications, safety, self care, transfers, toileting, skin care, etc.  Current Barriers to Discharge: Neurogenic bowel and bladder, Lack of/limited family support, Weight, and Weight bearing restrictions  Possible Resolutions to Barriers: Family education Obtain additional caregivers to provide 2:1 ratio Adhere to appropriate diet/lifestyle modifications to improve weight and overall health Order recommended DME     Medical Summary Current Status: [name]' is staying at night- doing bowel porgram- small balls- severe spasticity- not sleeping well  Barriers to Discharge:  Behavior/Mood;Hypotension;Inadequate Nutritional Intake;Incontinence;Medical stability;Neurogenic Bowel & Bladder;Self-care education;Spasticity;Uncontrolled Pain;Weight bearing restrictions  Barriers to Discharge Comments: spasticity is biggest limiter- but also confusion, neurogenic bowel and bladder, pain; think he would benefit from C7-T1  post fusion Possible Resolutions to [employer] Focus: spasticity SO BAD- think C7-T1 fusion and decompression is needed- spoke with Dr Carollee- - will see if surgeon can do surgery?- d/c 1/22- but not dsafe to go home   Continued Need for Acute Rehabilitation Level of Care: The patient requires daily medical management by a physician with specialized training in physical medicine and rehabilitation for the following reasons: Direction of a multidisciplinary physical rehabilitation program to maximize functional independence : Yes Medical management of patient stability for increased activity during participation in an intensive rehabilitation regime.: Yes Analysis of laboratory values and/or radiology reports with any subsequent need for medication adjustment and/or medical intervention. : Yes   I attest that I was present, lead the team conference, and concur with the assessment and plan of the team.   Darice LITTIE Boring 07/09/2023, 7:51 PM

## 2023-07-09 NOTE — Progress Notes (Signed)
 Occupational Therapy Session Note  Patient Details  Name: Leon Rodriguez MRN: [id number] Date of Birth: 03/22/1966  Today's Date: 07/09/2023 OT Individual Time: 1000-1055 OT Individual Time Calculation (min): 55 min    Short Term Goals: Week 4:  OT Short Term Goal 1 (Week 4): Pt will perform TB transfers with mod A+1 on a level surface OT Short Term Goal 2 (Week 4): Pt will perform LB dressing with max A at bed level OT Short Term Goal 3 (Week 4): Pt will complete UB dressing with min A seated EOB or in w/c  Skilled Therapeutic Interventions/Progress Updates:    Pt resting in w/c upon arrival with SO present. Pt reports he feels like the w/c is closing in on him and tightness across mid thoracic area. BLE spasms periodic and relieved with gently stretches/massage. Pt concerned about moving forward given his current situation. Pt reports his SO is only support he has and she has no support either. SO aware that recommendation for any transitional movements/transfers should be with minimum +2 assistance. Emotional support and therapeutic listening strategies employed. BLE stretching/massage for spasms. Pt returned to room and completed SB transfer with mod A+2 for safety. Pt remained in bed with SO present. SO expressed frustration and concern about pt's current status and what the future holds. Therapeutic listening provided.   Therapy Documentation Precautions:  Precautions Precautions: Fall, Cervical Precaution Comments: hx of many falls, severe B LE hypertonia with muscle spasms, verbally went through cervical precautions throughout session Required Braces or Orthoses: Cervical Brace Cervical Brace: Hard collar, Other (comment) (OOB) Restrictions Weight Bearing Restrictions Per Provider Order: Yes Other Position/Activity Restrictions: cervical precautions   Pain: Pt with ongoing periodic BLE and trunk spasms associated with grimaces; stretching and rest as  appropriate  Therapy/Group: Individual Therapy  Maritza Debby Mare 07/09/2023, 11:44 AM

## 2023-07-09 NOTE — Progress Notes (Signed)
 Per patient wife, patient had a small bm with bowel program.   Dominica Severin   RN

## 2023-07-09 NOTE — Progress Notes (Signed)
 IP Rehab Bowel Program Documentation   Bowel Program Start time 1630  Dig Stim Indicated? Yes  Dig Stim Prior to Suppository or mini Enema X 3   Output from dig stim: Moderate  Ordered intervention: Suppository Yes , mini enema No ,   Repeat dig stim after Suppository or Mini enema  X 3,  Output? Minimal   Bowel Program Complete? Yes , handoff given yes  Patient Tolerated? Yes   Leon Garland   RN

## 2023-07-09 NOTE — Progress Notes (Signed)
 PROGRESS NOTE   Subjective/Complaints:  Pt reports just not doing as well as he was before surgery-  Still has band around middle- that's tight- but less gas like 2 small BM's last 2 nights per fiance'.  6 little rocks  Coughing because thinks something feels stuck- from eggs.   Decreased confusion- and still poor sleep.  Sleepy during day but not quite as bad.     ROS:  Pt denies SOB, abd pain, CP, N/V/C/D, and vision changes  Objective:   No results found.  Recent Labs    07/08/23 0527  WBC 5.6  HGB 10.9*  HCT 31.1*  PLT 235      Recent Labs    07/08/23 0527  NA 139  K 3.9  CL 105  CO2 29  GLUCOSE 97  BUN 9  CREATININE 0.79  CALCIUM 9.5       Intake/Output Summary (Last 24 hours) at Rodriguez 0858 Last data filed at Rodriguez 0500 Gross per 24 hour  Intake 120 ml  Output 1200 ml  Net -1080 ml        Physical Exam: Vital Signs Blood pressure 92/61, pulse 88, temperature 97.8 F (36.6 C), resp. rate 18, height 5' 9 (1.753 m), weight 93.9 kg, SpO2 93%.      General: awake, alert, appropriate, sitting up in bed; with fiance' feeding him; NAD HENT: conjugate gaze; oropharynx moist CV: regular rate and rhythm; no JVD Pulmonary: CTA B/L; no W/R/R- good air movement GI: soft, NT, not distended- but hypoactive Psychiatric: appropriate Neurological: more alert this Rodriguez- still not talkative- fewer spasms this Rodriguez  Skin- healing blister on top of L foot-stable -Area of discoloration darkened skin on right heel, does not appear to be DTI  Neurological:  Fairly awake and alert, oriented x 3.  Follows all simple commands.  1-5: Left lower extremity MAS 2 knee extensors, MAS 1 plantar flexors, MAS 2 adductors Right lower extremity MAS 3-4 knee extensors, MAS 3 plantarflexors, MAS 3 adductors. Fairly consistent with prior exams 1/13 Strength: Moving bilateral upper extremities antigravity  against resistance; bilateral lower extremities not quite antigravity in hip flexion Sensation: Altered below T4  Prior:  Comments: Ue's 5-/5 throughout on exam B/L RLE- HF 2-/5; KE 2-/5; DF 4-/5; and PF 4-/5 LLE- HF 2-/5; KE 2-/5; DF 2/5; PF 2/5- spasticity still present Sensory level at T4 and below-      Assessment/Plan: 1. Functional deficits which require 3+ hours per day of interdisciplinary therapy in a comprehensive inpatient rehab setting. Physiatrist is providing close team supervision and 24 hour management of active medical problems listed below. Physiatrist and rehab team continue to assess barriers to discharge/monitor patient progress toward functional and medical goals  Care Tool:  Bathing    Body parts bathed by patient: Face   Body parts bathed by helper: Right arm, Left arm, Chest, Abdomen, Front perineal area, Buttocks, Right upper leg, Left upper leg, Right lower leg, Left lower leg (wife assisted pt with bath prior to OT evaluation)     Bathing assist Assist Level: Total Assistance - Patient < 25%     Upper Body Dressing/Undressing Upper body dressing   What  is the patient wearing?: Pull over shirt    Upper body assist Assist Level: Minimal Assistance - Patient > 75%    Lower Body Dressing/Undressing Lower body dressing      What is the patient wearing?: Pants, Incontinence brief     Lower body assist Assist for lower body dressing: Total Assistance - Patient < 25%     Toileting Toileting    Toileting assist Assist for toileting: 2 Helpers     Transfers Chair/bed transfer  Transfers assist     Chair/bed transfer assist level: 2 Helpers     Locomotion Ambulation   Ambulation assist   Ambulation activity did not occur: Safety/medical concerns (limited by extreme tone)          Walk 10 feet activity   Assist  Walk 10 feet activity did not occur: Safety/medical concerns        Walk 50 feet activity   Assist Walk 50  feet with 2 turns activity did not occur: Safety/medical concerns         Walk 150 feet activity   Assist Walk 150 feet activity did not occur: Safety/medical concerns         Walk 10 feet on uneven surface  activity   Assist Walk 10 feet on uneven surfaces activity did not occur: Safety/medical concerns         Wheelchair     Assist Is the patient using a wheelchair?: Yes Type of Wheelchair: Manual Wheelchair activity did not occur: Safety/medical concerns  Wheelchair assist level: Dependent - Patient 0% Max wheelchair distance: 150    Wheelchair 50 feet with 2 turns activity    Assist    Wheelchair 50 feet with 2 turns activity did not occur: Safety/medical concerns   Assist Level: Dependent - Patient 0%   Wheelchair 150 feet activity     Assist  Wheelchair 150 feet activity did not occur: Safety/medical concerns   Assist Level: Dependent - Patient 0%   Blood pressure 92/61, pulse 88, temperature 97.8 F (36.6 C), resp. rate 18, height 5' 9 (1.753 m), weight 93.9 kg, SpO2 93%.  Medical Problem List and Plan: 1. Functional deficits secondary to Cervical myelopathy s/p ACDF- back after sepsis             -patient may  shower             -ELOS/Goals: 3-4 weeks- min A hopefully- likely mod to max A Grounds pass ordered  D/c 07/17/23 Con't CIR PT and OT Reinforced he is NOT a candidate for ITB pump D/W Dr Carollee and Dr Vona at [facility] about possible SDR- as well as getting thoracic MRI to rul out another cause of spasticity being so bad.  1/14- Called Dr Dawley about T spine- also waiting to hear from [facility] if SDR would be appropriate.  2.  Antithrombotics: -DVT/anticoagulation:  Pharmaceutical: Lovenox              -antiplatelet therapy: N/A 3. Pain Management/spasticity:  MS contin  15 mg bid, Oxycodone  prn. Lidoderm  patch for local measures. Tylenol  650 mg qid.             --bilateral hip pain with attempts at rolling.              --on  Baclofen  20 mg qid, Valium  5 mg qid with Zanaflex  4 mg qid prn for spasticity.  -12/27 Started Duloxetine  20 mg at bedtime 12/25- titrate up every 3-4 days as tolerated  -bumped baclofen  to 25mg   qid, consider scheduling tizanidine   -added gabapentin  100mg  tid for injury level/chest wall pain/spasticity 12/29- pushing antispasmodics given the severity of his spasticity. Continue to titrate neuro-sedation permitting -increased baclofen  to 30mg  qid 12/28 -continue valium , consider scheduled tizanidine  also -increased duloxetine  (now in Rodriguez) to 30mg  -pt refuses to take gabapentin  as he doesn't want more potentially sedating medication--I stopped it yesterday 12/30- will add Dantrolene  50 mg at bedtime- explained it's gradual onset- will increase q5 days 12/31- will stop Dantrolene - so sleepy this Rodriguez- calling Dr Carollee to see what he thinks about placing ITB pump sooner than later? 1/1- educated fiance and pt that cannot do ITB pump due to lack of flow of CSF- p[er NSU- reason couldn't get LP for CT myelogram Will wait for now on Keppra , but if sedation gets better, can try 1/2- sinc ehaving confusion, will stop Zanaflex - in rare cases, can cause hallucinations (I've sene in other patients) and will give more baclofen  prn 10 mg TID prn 1/3- tone if anything looking worse- don't know what else can do right now except Keppra , but will need to reduce Valium , or something else to try the Keppra  1/4:  discussed with patient, wish to wait until primary team is back on Monday to make major medication adjustments/add Keppra - 1/5: tone remains severe, again patient wishes major medication adjustments per primary team, but could likely retry Zanaflex  given confusion has not improved 1/6- will add keppra  250 mg BID and reduce MS Contin  to 1x/day in evening. To reduce sedation during day.  1/7- pain more in Rodriguez yesterday, but did better overnight- let's see if Keppra  helps or hurts?will d/w SCI docs to see what  opther options there might be 1/8- Oxy was what made pt confused yesterday per wife- wil sop and change to Norco- less chance of confusion 1/9- very sleepy this Rodriguez- will d/w nursing why more today? 1/10- delay in getting pain meds today and Vlaium- d/w PA 1/11 continues to have poor body wide pain, sounds like possibly dantrolene  may be helping however difficult to assess as patient is more agitated today.  Does not appear this is making him sedated thus far.  Will see if we can get him different mattress 1/13 pt feels dantrium  is helping. Will try day time dose as well.  1/14- no more sedated this Rodriguez 4. Mood/Behavior/Sleep: LCSW to follow for evaluation and support.              -antipsychotic agents: N/A  -12/27--trazodone  and melatonin helpful 12/28 doing better with earlier scheduling of trazodone  and melatonin 1/2- having some confusion at night- not sure if due to Zanaflex , which I sotpped, but will give Seroquel  25 mg at bedtime to help as well -d/c trazodone  1/6- will increase Duloxetine  to 60 mg at bedtime- since sedated during day, to reduce daytime sedation 1/9- will make seroquel  75 mg at bedtime PRN and see if that helps Rodriguez sedation- I'm also wondering if increase in Duloxetine  making him sleepier- will check on him again today.  1/13 fairly alert. Observe with addition of day time dantrium  1/14- no increased sedation- still doesn't sleep well at night, but decreased confusion 5. Neuropsych/cognition: This patient is? capable of making decisions on his own behalf. 6. Skin/Wound Care: Routine pressure relief measures.   -has developed left heel sore d/t tone, struggles to fit in [facility]  -heel dressed, ordered prevalon boots so that he's better able to tolerate wear-- -12/29 prevalon boots in place this Rodriguez--tolerating -1/4: right heel ?  DTI on exam--more consistent with skin color variation.  Other wounds appear improving/stable.  7. Hyponatremia: continue to monitor Na  weekly  12/30- Na 139  8. Klebsiella aerogenes bacteremia/sepsis /UTI: levaquin   -abx completed per ID recs--7 days  9. Neurogenic bowel: Continue Senna S 2 tabs BID. Miralax  daily-->has been incontinent of bowel and question due to antibiotics and/or laxatives --Suppository daily after supper-->has refused for past 3 nights.  --would prefer enema three times a week as at home. Have suggested using mini enema 1/7- LBM last night with Enemeze 1/8- refused bowel program tonight- wants a break 1/9- Refused bowel program again last night- concerned his constipation will get him more spastic 1/11- fiance did bowel program last night 1/11 continue to encourage regular bowel program, LBM 1/9 1/13 LBM with bowel program  1/14- LBM last night, but small last 2 nights- hard balls- will add miralax  as well as give Sorbitol  to improve results.  10. Neurogenic bladder: Foley was replaced on acute-->will d/c and start bladder program. (patient would like to resume)   1/6- will change caths to q4 hours scheduled to reduce spasticity  1/7- fiance doing most of caths  1/11 cath volumes appear overall appropriate  1/12 IC volume 650 and 600 this morning, may have waited a little too long between ICU overnight  1/14- caths lower volumes than normal- will ask wife to push fluids 11. Neuropathy/at level SCI pain: Was on Cymbalta  PTA-resumed at 20 mg at bedtime, continue--increased to 30mg  daily effective 12/28  12/30- doesn't want Cymbalta  for pain, but willing to take for mood  1-5: See #4 above  1/6- will increase Duloxetine  to 60 mg at bedtime for pain/mood.  12. Abd pain/gas/at level SCI pain  1/6- will add Gasx 160 mg QID and also Keppra  250 mg BID   1/7- No major changes today- will monitor closely  1/10- will stop Keppra -no improvement   I spent a total of 44    minutes on total care today- >50% coordination of care- due to  D/w wife and pt at length- also changed bowel meds- d/w nursing- also  reached out to Dr Dawley about T-spine MRI-  Also team conference to f/u on care    LOS: 21 days A FACE TO FACE EVALUATION WAS PERFORMED  Leon Rodriguez, Leon Rodriguez

## 2023-07-09 NOTE — Progress Notes (Signed)
 Patient seen and examined.  Continues to complain of significant lower extremity spasms and difficulty standing.   He is awake alert oriented, speech is fluent and appropriate Cervical collar in place Bilateral upper extremities are 4+/5 throughout Proximal bilateral lower extremities are 2/5 Distal right lower extremity, 3/5 Distal left lower extremity 2/5 Anterior cervical incision well-healed   Assessment and plan:  C6/T1 cervical spondylotic myelopathy  -MRI again attempted to be obtained.  It is significantly motion degraded but there does appear to be moderate to severe residual stenosis primarily at C7-T1 without any changes and cord signal abnormality.  There is moderate spondylosis and stenosis at T1-2, T2-3, T4-5 and T6-7 that is unchanged. -I again had an extensive discussion with the patient, his sister and his wife at bedside.  We discussed posterior cervical laminectomy C6-T1 with lateral mass/pedicle screw instrumentation and fusion given his continued residual stenosis.  He has not had any significant improvement as of recently including after completion of treatment of his urinary tract infection -I think treating the residual stenosis C6/T1 would be primary before performing a selective dorsal rhizotomy's.   Patient and family are going to discuss amongst themselves, I will continue to follow along.

## 2023-07-10 ENCOUNTER — Other Ambulatory Visit: Payer: Self-pay | Admitting: Neurological Surgery

## 2023-07-10 DIAGNOSIS — G959 Disease of spinal cord, unspecified: Secondary | ICD-10-CM | POA: Diagnosis not present

## 2023-07-10 MED ORDER — DOCUSATE SODIUM 100 MG PO CAPS
100.0000 mg | ORAL_CAPSULE | Freq: Every day | ORAL | Status: DC
  Administered 2023-07-10 – 2023-07-12 (×3): 100 mg via ORAL
  Filled 2023-07-10 (×3): qty 1

## 2023-07-10 MED ORDER — SORBITOL 70 % SOLN
60.0000 mL | Freq: Once | Status: AC
  Administered 2023-07-10: 60 mL via ORAL
  Filled 2023-07-10: qty 60

## 2023-07-10 NOTE — Progress Notes (Signed)
 Physical Therapy Session Note  Patient Details  Name: Leon Rodriguez MRN: [id number] Date of Birth: 11-10-1965  Today's Date: 07/10/2023 PT Individual Time: 1400-1432 PT Individual Time Calculation (min): 32 min   Short Term Goals: Week 3:  PT Short Term Goal 1 (Week 3): Pt will verbalize pressure relief with cueing PT Short Term Goal 2 (Week 3): Pt will perform SBT with assist of 1 PT Short Term Goal 3 (Week 3): Pt will initiate care direction with transfers.  Skilled Therapeutic Interventions/Progress Updates:      Therapy Documentation Precautions:  Precautions Precautions: Fall, Cervical Precaution Comments: hx of many falls, severe B LE hypertonia with muscle spasms, verbally went through cervical precautions throughout session Required Braces or Orthoses: Cervical Brace Cervical Brace: Hard collar, Other (comment) (OOB) Restrictions Weight Bearing Restrictions Per Provider Order: Yes Other Position/Activity Restrictions: cervical precautions  Pt received semi-reclined in bed, agreeable to PT session with emphasis on posterior chain activation with various sit<>stands from bedside. Pt mod A with supine to sit and max A with sit to lying. Pt performed ~3 sit<>stand and able to achieve partial upright position with max A x 2 with assist anterior and posterior with knee block from therapist. Pt returned to bed for personal care by nurse as pt pending cath. Pt left with all needs in reach and nurse present. Pt provided rest/repositioning for R UE pain in session.     Therapy/Group: Individual Therapy  Gladystine Lamprey Gladystine Lamprey PT, DPT  07/10/2023, 3:55 PM

## 2023-07-10 NOTE — Progress Notes (Signed)
 Physical Therapy Session Note  Patient Details  Name: Leon Rodriguez MRN: [id number] Date of Birth: Sep 08, 1965  Today's Date: 07/10/2023 PT Individual Time: 1130-1210 PT Individual Time Calculation (min): 40 min   Short Term Goals: Week 3:  PT Short Term Goal 1 (Week 3): Pt will verbalize pressure relief with cueing PT Short Term Goal 2 (Week 3): Pt will perform SBT with assist of 1 PT Short Term Goal 3 (Week 3): Pt will initiate care direction with transfers.  Skilled Therapeutic Interventions/Progress Updates: Pt presented in TIS agreeable to therapy. Pt states unrated pain with no spasms at start of session. Pt given option to work on transfers or trial standing frame. Pt requesting to work on transfers. Pt transported to day room for time management. Pt set up to high/low mat and noted increase in spasms. PTA donned 11lb weights (7lb & 4lb combined), and set up Slide board total A. Pt then performed block practice transfers with emphasis on anterior lean as well as head hips relationship. Pt was able to complete downhill transfer with as little as CGA (requiring max multimodal cues for anterior lean). Level transfers with minA and up hill transfers with modA. Although PTA performing max multimodal cues for leaning forward pt began to demonstrate some carryover and improvement in technique. Pt then transported back to room and set up in same manner for Slide board transfer back to bed to allow for I/O cath. Pt was able to complete level transfer back to bed with minA!. Pt required modA for sit to supine for BLE management. Pt repositioned to comfort and left in bed at end of session with call bell within reach, current needs met, and SO present.      Therapy Documentation Precautions:  Precautions Precautions: Fall, Cervical Precaution Comments: hx of many falls, severe B LE hypertonia with muscle spasms, verbally went through cervical precautions throughout session Required Braces or  Orthoses: Cervical Brace Cervical Brace: Hard collar, Other (comment) (OOB) Restrictions Weight Bearing Restrictions Per Provider Order: Yes Other Position/Activity Restrictions: cervical precautions General:   Vital Signs: Therapy Vitals Temp: 97.6 F (36.4 C) Pulse Rate: 78 Resp: 18 BP: 112/79 Patient Position (if appropriate): Lying Oxygen Therapy SpO2: 100 % O2 Device: Room Air    Therapy/Group: Individual Therapy  Lessly Stigler 07/10/2023, 4:41 PM

## 2023-07-10 NOTE — Progress Notes (Signed)
 I discussed with the patient and his wife today about posterior cervical decompression C6/T1 with instrumentation and fusion.  They would like to proceed with surgical scheduling.  I will get with operating room tentatively planned on Friday afternoon.  We went over risks benefits and expected outcomes.

## 2023-07-10 NOTE — Progress Notes (Signed)
   07/10/23 0950  Spiritual Encounters  Type of Visit Initial  Care provided to: Patient  Referral source Patient request  Reason for visit Routine spiritual support  OnCall Visit No  Spiritual Framework  Presenting Themes Values and beliefs;Goals in life/care;Meaning/purpose/sources of inspiration;Significant life change;Impactful experiences and emotions;Courage hope and growth  Values/beliefs Patient is a believer in [place], Patient believes in prayer and believes everything has a purpose.  Community/Connection Significant other  Patient Stress Factors Health changes;Major life changes  Family Stress Factors Not reviewed  Interventions  Spiritual Care Interventions Made Established relationship of care and support;Compassionate presence;Reflective listening;Normalization of emotions;Explored values/beliefs/practices/strengths;Meaning making;Prayer;Encouragement  Intervention Outcomes  Outcomes Connection to spiritual care;Awareness around self/spiritual resourses;Connection to values and goals of care;Awareness of health;Awareness of support  Spiritual Care Plan  Spiritual Care Issues Still Outstanding No further spiritual care needs at this time (see row info)  Follow up plan  chaplain team remains available plese place consult or page as needed   Chaplain responded to spiritual consult. Chaplain was shadowed by Chaplain Intern Alphonzo Ask. Patient has found hope in the possibility of a better outcome post second surgery. Patient has faith in God and God's plans. Patient has found "new depths of patience." Patient's significant other does not have a particular belief system. The patient's significant other stepped out while chaplain was visiting. Chaplain provided prayer per patient's request. No further needs at this time. Chaplain team remains available as needed/requested.   Jairo Mayer, Chaplain Resident [phone]

## 2023-07-10 NOTE — Progress Notes (Signed)
 Occupational Therapy Session Note  Patient Details  Name: Leon Rodriguez MRN: [id number] Date of Birth: [birth year]  Today's Date: 07/10/2023 OT Individual Time: 1191-4782 OT Individual Time Calculation (min): 60 min  and Today's Date: 07/10/2023 OT Missed Time: 15 Minutes Missed Time Reason: Other (comment) (surgery consult)   Short Term Goals: Week 4:  OT Short Term Goal 1 (Week 4): Pt will perform TB transfers with mod A+1 on a level surface OT Short Term Goal 2 (Week 4): Pt will perform LB dressing with max A at bed level OT Short Term Goal 3 (Week 4): Pt will complete UB dressing with min A seated EOB or in w/c  Skilled Therapeutic Interventions/Progress Updates:    Pt missed 15 mins skilled at beginning of session 2/2 consult. OT intervention with focus on bed mobility, sitting balance, TB transfer, BLE stretching/activities, upcoming surgery. Pt will undergo additional surgery to address ongoing BLE spasms. Supine>sit EOB with min A. Sitting balance with CGA. Pt able to laterally scoot to Lt to be closer to w/c before TB placement and tranfser to w/c. TB transfer with min A and no spasms noted. Discussed upcoming suregery. Pt reports he understands this is best option and is at peace with decision. Pt able to kick small therapy ball with RLE but unable to activate knee extension with LLE. Knee extension activities with 2# cuff. Pt returned to room and sat with SO awaiting EVS to finish cleaning.   Therapy Documentation Precautions:  Precautions Precautions: Fall, Cervical Precaution Comments: hx of many falls, severe B LE hypertonia with muscle spasms, verbally went through cervical precautions throughout session Required Braces or Orthoses: Cervical Brace Cervical Brace: Hard collar, Other (comment) (OOB) Restrictions Weight Bearing Restrictions Per Provider Order: Yes Other Position/Activity Restrictions: cervical precautions General: General OT Amount of Missed Time: 15  Minutes    Pain: Pt reports ongoing spasms; rest, stretching as appropriate   Therapy/Group: Individual Therapy  [name] 07/10/2023, 11:23 AM

## 2023-07-10 NOTE — Progress Notes (Addendum)
 Patient ID: Leon Rodriguez, male   DOB: Nov 04, 1965, 58 y.o.   MRN: [id number]  SW informed pt scheduled for surgery on Friday to acute, and plans to return within 2-3 days per attending.   SW met with pt s/o [name] and she confirms above.   SW updated [facility] on above changes.   *SW received return message from Diane Harrington [phone]) with insurance about [facility] referral stating case will be closed and for SW or patient to follow-up once there is a new discharge date.   Norval Been, MSW, LCSW Office: [phone] Cell: [phone] Fax: [phone]

## 2023-07-10 NOTE — Discharge Summary (Signed)
Physician Discharge Summary  Patient ID: Leon Rodriguez MRN: [id number] DOB/AGE: 1966-01-26 58 y.o.  Admit date: 06/18/2023 Discharge date: 07/12/2023  Discharge Diagnoses:  Principal Problem:   Cervical myelopathy (HCC) Active Problems:   Obesity (BMI 30-39.9)   Neurogenic bladder   Adjustment disorder with depressed mood   Spasticity   Discharged Condition: stable  Significant Diagnostic Studies: MR THORACIC SPINE WO CONTRAST Result Date: 07/06/2023 CLINICAL DATA:  Spinal cord injury.  Severe spasms EXAM: MRI THORACIC SPINE WITHOUT CONTRAST TECHNIQUE: Multiplanar, multisequence MR imaging of the thoracic spine was performed. No intravenous contrast was administered. COMPARISON:  MRI 06/11/2023 FINDINGS: Technical Note: Despite efforts by the technologist and patient, motion artifact is present on today's exam and could not be eliminated. This reduces exam sensitivity and specificity. Alignment:  Unchanged alignment.  No traumatic listhesis. Vertebrae: Susceptibility artifact from ACDF hardware is present at the C6-T1 levels. Thoracic vertebral body heights are maintained. No acute fracture. No evidence of discitis. No suspicious marrow replacing bone lesions. Cord: Suspected increased signal within the lower cervical cord as seen on the previous study, evaluation of which is significantly degraded by motion artifact. No convincing sites of thoracic cord signal abnormality within the limitations of this motion degraded examination. Paraspinal and other soft tissues: Negative. Disc levels: Severe canal stenosis at the C7-T1 level. Additional sites of mild to moderate canal stenosis including at the T1-T2, T2-T3, T4-T5, T6-T7 levels is unchanged from prior. Similar degree of bilateral foraminal stenosis, notably involving the T1-T2 and T11-T12 levels. IMPRESSION: 1. No convincing sites of thoracic cord signal abnormality within the limitations of this motion degraded examination. Suspected  increased signal within the lower cervical cord as seen on the previous study, evaluation of which is significantly degraded by motion artifact. 2. No new or acute osseous abnormality of the thoracic spine. 3. Severe canal stenosis at the C7-T1 level. Additional sites of mild to moderate canal stenosis including at the T1-T2, T2-T3, T4-T5, T6-T7 levels is unchanged from prior. Electronically Signed   By: Duanne Guess D.O.   On: 07/06/2023 13:00     Labs:  Basic Metabolic Panel: Recent Labs  Lab 07/08/23 0527 07/11/23 0605  NA 139 140  K 3.9 3.6  CL 105 103  CO2 29 26  GLUCOSE 97 105*  BUN 9 7  CREATININE 0.79 0.95  CALCIUM 9.5 9.4    CBC: Recent Labs  Lab 07/08/23 0527 07/11/23 0605  WBC 5.6 5.8  HGB 10.9* 11.0*  HCT 31.1* 31.7*  MCV 96.3 96.4  PLT 235 218    CBG: No results for input(s): "GLUCAP" in the last 168 hours.  Brief HPI:   Leon Rodriguez is a 58 y.o. male with history of progressive BLE weakness with numbness and difficulty walking with multiple falls with workup revealing cervical myelopathy with C6/T1 severe cervical stenosis with cord compression.  He underwent ACDF C6/T1 by Dr. Jake Samples on 08/05/2022 and postop continued to have issues with significant weakness and lower extremity spasms.  Therapy evaluated patient and CIR was recommended due to functional decline.  Hospital course significant neurogenic bowel and bladder, issues with severe spasticity and hematuria due to traumatic I/O caths.  He did develop fever with sinus tach due to sepsis on 12/18 and was found to have Klebsiella erogenous bacteremia secondary to UTI/cystitis.  He was started on IV antibiotics and transferred to acute hospital 06/13/23 for management.  He completed 7-day course of antibiotic regimen with improvement in blood pressure and was showing  updated resume therapy.  He was requiring close to max assist with left for transfers.  CIR was recommended to resume intensive rehab program  due to ongoing deficits.   Hospital Course: Leon Rodriguez was admitted to rehab 06/18/2023 for inpatient therapies to consist of PT and OT at least three hours five days a week. Past admission physiatrist, therapy team and rehab RN have worked together to provide customized collaborative inpatient rehab. During patient's stay in rehab weekly team conferences were held to monitor patient's progress, set goals and discuss barriers to discharge. At admission, patient required total assist for lower body ADLs and with mobility.  Levaquin was completed on 12/26 for 7-day course antibiotic regimen per ID.  His  blood pressures were monitored on TID basis and was relatively stable.  He was started on bowel program and has been educated on importance of regular schedule to help avoid constipation, draining bowel and prevent dysreflexia.  In-N-Out caths ongoing every 4 hours to keep bladder volumes at 350 to 500 cc cc and prevent dysreflexia.   He has continued to have significant spasticity of lower extremities despite Valium and baclofen on 4 times daily basis.  He has had side effects trial of tizanidine and dantrolene.  Keppra was added with decrease in MS Contin to once a day to reduce sedation during the day.  Oxy was used on as needed basis of however family felt that this was causing confusion therefore this was changed to Norco.  He continued to report diffuse pain and dantrolene was resumed at lower dose and patient appears to be tolerating this therefore this was titrated up to twice daily without side effects.  Mattress was also changed out to help with pain management.  His sleep-wake disruption has been managed with use of melatonin as well as Seroquel as needed to help with agitation confusion at nights.  MRI of cervical spine was repeated for follow-up on 01/11 due to severity of spasticity and revealed severe canal stenosis at C7-T1 level and moderate spondylosis and stenosis T1-T7 that was  unchanged.  Dr. Jake Samples was consulted for input and recommended posterior cervical laminectomy at C6/T1 given his continued residual stenosis prior to performing selective dorsal rhizotomy.  Please note that patient has poor results with bowel program last night with only a few hard balls and recommend sorbitol in am followed by SSE a few hours later. Also continue I/O cath to keep volumes < 350 cc to prevent dysreflexia. Follow up labs reveal that hyponatremia has resolved and renal status to be WNL. CBC shows ABLA to be stable. Patient was agreeable to surgery and was discharged to acute floor on 07/12/23   Medications at discharge: Tylenol 650 mg po QID Baclofen 30 mg po qid Baclofen 10 mg po tid prn Dantrium 25 mg po bid Valium 5 mg po qid Colace 100 mg po every day Mini enema daily at 1800 mg  Cymbalta 60 mg po daily Lovenox 40 mg SQ daily Mucinex 600 mg po bid Norco 7/5 mg po every 4 hours prn severe pain Lidocaine patch to back on at 8 am and off at 8 pm 13. MS Contin 15 mg po daily with supper 14. Miralax daily with breakfast 15. Seroquel 75  mg po daily at bedtime 16. Senna S 2 tabs with breakfast. 17. Simethicone 160 mg po qid 18. Flomax 0.8 mg daily with supper 19. Sorbitol 60 cc po daily prn moderate constipation.   Diet: Regular  Special Instructions: Recommend  sorbitol 60 cc in am followed by SSE 6-8 hour later to help with constipation. Monitor for disreflexia. In and out cath every 4 hours to keep bladder volumes < 350 cc  Disposition: Acute hospital.     Follow-up Information     Kathleen Lime, MD Follow up.   Specialty: Internal Medicine Why: Call in 1-2 days for post hospital follow up Contact information: [address] [place] [address] [phone]         Genice Rouge, MD Follow up.   Specialty: Physical Medicine and Rehabilitation Why: office will call you with follow up appointment Contact information: [address] [place] [address] [phone]         Dawley, Kendell Bane C, DO Follow up.   Why: Call in 1-2 days for post hospital follow up Contact information: [address] [place] [address] [place] [address] [phone]                 Signed: Jacquelynn Cree 07/12/2023, 5:44 PM

## 2023-07-10 NOTE — Progress Notes (Signed)
 PROGRESS NOTE   Subjective/Complaints:  Pt had moderate BM last night with program.  Type 1 BM.  Still really hard- digging it out somewhat.  Dr Dawley and I spoke with pt about surgery- will leave rehab and come back hopefully on pod #2.  Based on insurance requirements.  Surgery planned tentatively for Friday PM.   NO change in spasticity- more awake this AM   ROS:   Pt denies SOB, abd pain, CP, N/V/C/D, and vision changes   Objective:   No results found.  Recent Labs    07/08/23 0527  WBC 5.6  HGB 10.9*  HCT 31.1*  PLT 235      Recent Labs    07/08/23 0527  NA 139  K 3.9  CL 105  CO2 29  GLUCOSE 97  BUN 9  CREATININE 0.79  CALCIUM 9.5       Intake/Output Summary (Last 24 hours) at 07/10/2023 0941 Last data filed at 07/10/2023 0900 Gross per 24 hour  Intake --  Output 2355 ml  Net -2355 ml        Physical Exam: Vital Signs Blood pressure 108/77, pulse 86, temperature 97.8 F (36.6 C), resp. rate 16, height 5\' 9"  (1.753 m), weight 95.4 kg, SpO2 100%.       General: awake, alert, appropriate, sitting up eating breakfast- fiance' at bedside; Dr Dawley also in room; NAD HENT: conjugate gaze; oropharynx moist CV: regular rate and rhythm; no JVD Pulmonary: CTA B/L; no W/R/R- good air movement GI: soft, NT, ND, hypoactive-  Psychiatric: slightly more interactive Neurological: Ox3  Skin- healing blister on top of L foot-stable -Area of discoloration darkened skin on right heel, does not appear to be DTI  Neurological:  Fairly awake and alert, oriented x 3.  Follows all simple commands.  1-5: Left lower extremity MAS 2 knee extensors, MAS 1 plantar flexors, MAS 2 adductors Right lower extremity MAS 3-4 knee extensors, MAS 3 plantarflexors, MAS 3 adductors. Fairly consistent with prior exams 1/13 Strength: Moving bilateral upper extremities antigravity against resistance; bilateral  lower extremities not quite antigravity in hip flexion Sensation: Altered below T4  Prior:  Comments: Ue's 5-/5 throughout on exam B/L RLE- HF 2-/5; KE 2-/5; DF 4-/5; and PF 4-/5 LLE- HF 2-/5; KE 2-/5; DF 2/5; PF 2/5- spasticity still present Sensory level at T4 and below-      Assessment/Plan: 1. Functional deficits which require 3+ hours per day of interdisciplinary therapy in a comprehensive inpatient rehab setting. Physiatrist is providing close team supervision and 24 hour management of active medical problems listed below. Physiatrist and rehab team continue to assess barriers to discharge/monitor patient progress toward functional and medical goals  Care Tool:  Bathing    Body parts bathed by patient: Face   Body parts bathed by helper: Right arm, Left arm, Chest, Abdomen, Front perineal area, Buttocks, Right upper leg, Left upper leg, Right lower leg, Left lower leg (wife assisted pt with bath prior to OT evaluation)     Bathing assist Assist Level: Total Assistance - Patient < 25%     Upper Body Dressing/Undressing Upper body dressing   What is the patient wearing?: Pull  over shirt    Upper body assist Assist Level: Minimal Assistance - Patient > 75%    Lower Body Dressing/Undressing Lower body dressing      What is the patient wearing?: Pants, Incontinence brief     Lower body assist Assist for lower body dressing: Total Assistance - Patient < 25%     Toileting Toileting    Toileting assist Assist for toileting: 2 Helpers     Transfers Chair/bed transfer  Transfers assist     Chair/bed transfer assist level: 2 Helpers     Locomotion Ambulation   Ambulation assist   Ambulation activity did not occur: Safety/medical concerns (limited by extreme tone)          Walk 10 feet activity   Assist  Walk 10 feet activity did not occur: Safety/medical concerns        Walk 50 feet activity   Assist Walk 50 feet with 2 turns activity did  not occur: Safety/medical concerns         Walk 150 feet activity   Assist Walk 150 feet activity did not occur: Safety/medical concerns         Walk 10 feet on uneven surface  activity   Assist Walk 10 feet on uneven surfaces activity did not occur: Safety/medical concerns         Wheelchair     Assist Is the patient using a wheelchair?: Yes Type of Wheelchair: Manual Wheelchair activity did not occur: Safety/medical concerns  Wheelchair assist level: Dependent - Patient 0% Max wheelchair distance: 150    Wheelchair 50 feet with 2 turns activity    Assist    Wheelchair 50 feet with 2 turns activity did not occur: Safety/medical concerns   Assist Level: Dependent - Patient 0%   Wheelchair 150 feet activity     Assist  Wheelchair 150 feet activity did not occur: Safety/medical concerns   Assist Level: Dependent - Patient 0%   Blood pressure 108/77, pulse 86, temperature 97.8 F (36.6 C), resp. rate 16, height 5\' 9"  (1.753 m), weight 95.4 kg, SpO2 100%.  Medical Problem List and Plan: 1. Functional deficits secondary to Cervical myelopathy s/p ACDF- back after sepsis             -patient may  shower             -ELOS/Goals: 3-4 weeks- min A hopefully- likely mod to max A Grounds pass ordered  D/c 07/17/23 Con't CIR PT and OT Going to OR and off unit for Posterior fusion of C7-T1 by Dr Dawley-  2.  Antithrombotics: -DVT/anticoagulation:  Pharmaceutical: Lovenox              -antiplatelet therapy: N/A 3. Pain Management/spasticity:  MS contin  15 mg bid, Oxycodone  prn. Lidoderm  patch for local measures. Tylenol  650 mg qid.             --bilateral hip pain with attempts at rolling.              --on Baclofen  20 mg qid, Valium  5 mg qid with Zanaflex  4 mg qid prn for spasticity.  -12/27 Started Duloxetine  20 mg at bedtime 12/25- titrate up every 3-4 days as tolerated  -bumped baclofen  to 25mg  qid, consider scheduling tizanidine   -added gabapentin   100mg  tid for injury level/chest wall pain/spasticity 12/29- pushing antispasmodics given the severity of his spasticity. Continue to titrate neuro-sedation permitting -increased baclofen  to 30mg  qid 12/28 -continue valium , consider scheduled tizanidine  also -increased duloxetine  (now in am) to  30mg  -pt refuses to take gabapentin  as he doesn't want more potentially sedating medication--I stopped it yesterday 12/30- will add Dantrolene  50 mg at bedtime- explained it's gradual onset- will increase q5 days 12/31- will stop Dantrolene - so sleepy this AM- calling Dr Julane Ny to see what he thinks about placing ITB pump sooner than later? 1/1- educated fiance and pt that cannot do ITB pump due to lack of flow of CSF- p[er NSU- reason couldn't get LP for CT myelogram Will wait for now on Keppra , but if sedation gets better, can try 1/2- sinc ehaving confusion, will stop Zanaflex - in rare cases, can cause hallucinations (I've sene in other patients) and will give more baclofen  prn 10 mg TID prn 1/3- tone if anything looking worse- don't know what else can do right now except Keppra , but will need to reduce Valium , or something else to try the Keppra  1/4:  discussed with patient, wish to wait until primary team is back on Monday to make major medication adjustments/add Keppra - 1/5: tone remains severe, again patient wishes major medication adjustments per primary team, but could likely retry Zanaflex  given confusion has not improved 1/6- will add keppra  250 mg BID and reduce MS Contin  to 1x/day in evening. To reduce sedation during day.  1/7- pain more in AM yesterday, but did better overnight- let's see if Keppra  helps or hurts?will d/w SCI docs to see what opther options there might be 1/8- Oxy was what made pt confused yesterday per wife- wil sop and change to Norco- less chance of confusion 1/9- very sleepy this AM- will d/w nursing why more today? 1/10- delay in getting pain meds today and Vlaium- d/w  PA 1/11 continues to have poor body wide pain, sounds like possibly dantrolene  may be helping however difficult to assess as patient is more agitated today.  Does not appear this is making him sedated thus far.  Will see if we can get him different mattress 1/13 pt feels dantrium  is helping. Will try day time dose as well.  1/14- no more sedated this AM 4. Mood/Behavior/Sleep: LCSW to follow for evaluation and support.              -antipsychotic agents: N/A  -12/27--trazodone  and melatonin helpful 12/28 doing better with earlier scheduling of trazodone  and melatonin 1/2- having some confusion at night- not sure if due to Zanaflex , which I sotpped, but will give Seroquel  25 mg at bedtime to help as well -d/c trazodone  1/6- will increase Duloxetine  to 60 mg at bedtime- since sedated during day, to reduce daytime sedation 1/9- will make seroquel  75 mg at bedtime PRN and see if that helps am sedation- I'm also wondering if increase in Duloxetine  making him sleepier- will check on him again today.  1/13 fairly alert. Observe with addition of day time dantrium  1/14- no increased sedation- still doesn't sleep well at night, but decreased confusion 5. Neuropsych/cognition: This patient is? capable of making decisions on his own behalf. 6. Skin/Wound Care: Routine pressure relief measures.   -has developed left heel sore d/t tone, struggles to fit in [facility]  -heel dressed, ordered prevalon boots so that he's better able to tolerate wear-- -12/29 prevalon boots in place this am--tolerating -1/4: right heel ?DTI on exam--more consistent with skin color variation.  Other wounds appear improving/stable.  7. Hyponatremia: continue to monitor Na weekly  12/30- Na 139  8. Klebsiella aerogenes bacteremia/sepsis /UTI: levaquin   -abx completed per ID recs--7 days  9. Neurogenic bowel: Continue Senna S  2 tabs BID. Miralax  daily-->has been incontinent of bowel and question due to antibiotics and/or  laxatives --Suppository daily after supper-->has refused for past 3 nights.  --would prefer enema three times a week as at home. Have suggested using mini enema 1/7- LBM last night with Enemeze 1/8- refused bowel program tonight- wants a break 1/9- Refused bowel program again last night- concerned his constipation will get him more spastic 1/11- fiance did bowel program last night 1/11 continue to encourage regular bowel program, LBM 1/9 1/13 LBM with bowel program  1/14- LBM last night, but small last 2 nights- hard balls- will add miralax  as well as give Sorbitol  to improve results.  1/15- added Colace 100 mg daily and giving another 60cc Sorbitol  to get cleaned out before surgery 10. Neurogenic bladder: Foley was replaced on acute-->will d/c and start bladder program. (patient would like to resume)   1/6- will change caths to q4 hours scheduled to reduce spasticity  1/7- fiance doing most of caths  1/11 cath volumes appear overall appropriate  1/12 IC volume 650 and 600 this morning, may have waited a little too long between ICU overnight  1/14- caths lower volumes than normal- will ask wife to push fluids 11. Neuropathy/at level SCI pain: Was on Cymbalta  PTA-resumed at 20 mg at bedtime, continue--increased to 30mg  daily effective 12/28  12/30- doesn't want Cymbalta  for pain, but willing to take for mood  1-5: See #4 above  1/6- will increase Duloxetine  to 60 mg at bedtime for pain/mood.  12. Abd pain/gas/at level SCI pain  1/6- will add Gasx 160 mg QID and also Keppra  250 mg BID   1/7- No major changes today- will monitor closely  1/10- will stop Keppra -no improvement   I spent a total of  35  minutes on total care today- >50% coordination of care- due to  D/w pt and fiance and Dr Dawley about surgery- answered questions and d/w nursing about bowels  LOS: 22 days A FACE TO FACE EVALUATION WAS PERFORMED  Leon Rodriguez 07/10/2023, 9:41 AM

## 2023-07-11 DIAGNOSIS — G959 Disease of spinal cord, unspecified: Secondary | ICD-10-CM | POA: Diagnosis not present

## 2023-07-11 LAB — BASIC METABOLIC PANEL
Anion gap: 11 (ref 5–15)
BUN: 7 mg/dL (ref 6–20)
CO2: 26 mmol/L (ref 22–32)
Calcium: 9.4 mg/dL (ref 8.9–10.3)
Chloride: 103 mmol/L (ref 98–111)
Creatinine, Ser: 0.95 mg/dL (ref 0.61–1.24)
GFR, Estimated: >60 mL/min (ref >60)
Glucose, Bld: 105 mg/dL — ABNORMAL HIGH (ref 70–99)
Potassium: 3.6 mmol/L (ref 3.5–5.1)
Sodium: 140 mmol/L (ref 135–145)

## 2023-07-11 LAB — CBC
HCT: 31.7 % — ABNORMAL LOW (ref 39.0–52.0)
Hemoglobin: 11 g/dL — ABNORMAL LOW (ref 13.0–17.0)
MCH: 33.4 pg (ref 26.0–34.0)
MCHC: 34.7 g/dL (ref 30.0–36.0)
MCV: 96.4 fL (ref 80.0–100.0)
Platelets: 218 10*3/uL (ref 150–400)
RBC: 3.29 MIL/uL — ABNORMAL LOW (ref 4.22–5.81)
RDW: 12.8 % (ref 11.5–15.5)
WBC: 5.8 10*3/uL (ref 4.0–10.5)
nRBC: 0 % (ref 0.0–0.2)

## 2023-07-11 LAB — SURGICAL PCR SCREEN
MRSA, PCR: NEGATIVE
Staphylococcus aureus: NEGATIVE

## 2023-07-11 NOTE — Plan of Care (Signed)
  Problem: SCI BOWEL ELIMINATION Goal: RH STG MANAGE BOWEL WITH ASSISTANCE Description: STG Manage Bowel with toileting Assistance. Outcome: Progressing   Problem: SCI BLADDER ELIMINATION Goal: RH STG MANAGE BLADDER WITH ASSISTANCE Description: STG Manage Bladder With toileting Assistance Outcome: Progressing   Problem: RH SKIN INTEGRITY Goal: RH STG SKIN FREE OF INFECTION/BREAKDOWN Description: Manage w min assist Outcome: Progressing   Problem: RH PAIN MANAGEMENT Goal: RH STG PAIN MANAGED AT OR BELOW PT'S PAIN GOAL Description: < 4 with prns Outcome: Progressing

## 2023-07-11 NOTE — Progress Notes (Signed)
IP Rehab Bowel Program Documentation   Bowel Program Start time 1800  Dig Stim Indicated? Yes  Dig Stim Prior to Suppository or mini Enema X 3   Output from dig stim: Moderate  Ordered intervention: Suppository Yes , mini enema No ,   Repeat dig stim after Suppository or Mini enema  X 3,  Output? Moderate   Bowel Program Complete? Yes , handoff given yes  Patient Tolerated? Yes   Dominica Severin  RN

## 2023-07-11 NOTE — Progress Notes (Signed)
Occupational Therapy Discharge Summary  Patient Details  Name: Leon Rodriguez MRN: [id number] Date of Birth: August 27, 1965  Date of Discharge from OT service:July 11, 2023  Patient has met 0 of 8 long term goals   Pt progess was minimal during this admission. Pt requires mod A for bathing and min A for UB dressing. LB dressing and toileting dependent. TB transfers with mod A/max A+2 for safety. So present during therapy sessions and particiapted in therapy. Pt discharging to Acute for surgical procedure.Patient to discharge at overall  level.  Patient's care partner  to provide the necessary  assistance at discharge.    Reasons goals not met: discharge to Acute for surgical procedure prior to original discharge date  Recommendation:  To continue with OT services after sx  Equipment: No equipment provided  Reasons for discharge: change in medical status  Patient/family agrees with progress made and goals achieved: Yes  OT Discharge ADL ADL Eating: Set up Where Assessed-Eating: Wheelchair Grooming: Setup Where Assessed-Grooming: Wheelchair Upper Body Bathing: Moderate assistance Where Assessed-Upper Body Bathing: Bed level Lower Body Bathing: Maximal assistance Where Assessed-Lower Body Bathing: Bed level Upper Body Dressing: Minimal assistance Where Assessed-Upper Body Dressing: Edge of bed Lower Body Dressing: Dependent Where Assessed-Lower Body Dressing: Bed level Toileting: Dependent Toilet Transfer: Maximal assistance Toilet Transfer Method: Scientist, research (life sciences): Drop arm bedside commode Vision Baseline Vision/History: 0 No visual deficits Patient Visual Report: No change from baseline Vision Assessment?: No apparent visual deficits Perception  Perception: Within Functional Limits Praxis Praxis: WFL Cognition Cognition Overall Cognitive Status: Within Functional Limits for tasks assessed Arousal/Alertness: Awake/alert Orientation Level:  Person;Place;Situation Person: Oriented Place: Oriented Situation: Oriented Memory: Appears intact Attention: Focused;Sustained Focused Attention: Appears intact Sustained Attention: Appears intact Awareness: Appears intact Problem Solving: Impaired Safety/Judgment: Appears intact Brief Interview for Mental Status (BIMS) Repetition of Three Words (First Attempt): 3 Temporal Orientation: Year: Correct Temporal Orientation: Month: Accurate within 5 days Temporal Orientation: Day: Correct Recall: "Sock": Yes, no cue required Recall: "Blue": Yes, no cue required Recall: "Bed": Yes, no cue required BIMS Summary Score: 15 Sensation Sensation Light Touch: Impaired Detail Peripheral sensation comments: diminished sensation in B LEs, can feel deep touch Light Touch Impaired Details: Impaired RLE;Impaired LLE Hot/Cold: Appears Intact Proprioception: Impaired Detail Proprioception Impaired Details: Impaired LLE;Impaired RLE Stereognosis: Not tested Coordination Gross Motor Movements are Fluid and Coordinated: Yes Fine Motor Movements are Fluid and Coordinated: Yes Motor  Motor Motor: Abnormal tone;Paraplegia;Other (comment);Abnormal postural alignment and control Motor - Skilled Clinical Observations: severe B LE hypertonia with muscle spasms, B LE weakness, and sensory deficits Mobility     Trunk/Postural Assessment  Cervical Assessment Cervical Assessment: Exceptions to [facility] Cervical AROM Overall Cervical AROM: Due to precautions Thoracic Assessment Thoracic Assessment: Exceptions to [facility] Lumbar Assessment Lumbar Assessment: Exceptions to [facility] (limited by tone) Postural Control Postural Control: Deficits on evaluation Trunk Control: impaired and stays in a posterior pelvic tilt position due to B LE extensor tone  Balance Static Sitting Balance Static Sitting - Balance Support: Feet supported;Bilateral upper extremity supported Static Sitting - Level of Assistance: 5: Stand by  assistance Dynamic Sitting Balance Dynamic Sitting - Balance Support: Feet supported;During functional activity Dynamic Sitting - Level of Assistance: 2: Max assist Extremity/Trunk Assessment RUE Assessment RUE Assessment: Within Functional Limits LUE Assessment LUE Assessment: Within Functional Limits   Rich Brave 07/11/2023, 3:25 PM

## 2023-07-11 NOTE — Progress Notes (Signed)
PROGRESS NOTE   Subjective/Complaints:  Leon Rodriguez had 2 moderate stools with bowel program.  Still confused- taking off his clothes and brief per fiance'- just did this AM- also still sleepy from meds.  Leon Rodriguez has no specific changes/issues- fiance reports that surgery is at 2pm tomorrow.      ROS:   Leon Rodriguez denies SOB, abd pain, CP, N/V/C/D, and vision changes  Objective:   No results found.  Recent Labs    07/11/23 0605  WBC 5.8  HGB 11.0*  HCT 31.7*  PLT 218      Recent Labs    07/11/23 0605  NA 140  K 3.6  CL 103  CO2 26  GLUCOSE 105*  BUN 7  CREATININE 0.95  CALCIUM 9.4       Intake/Output Summary (Last 24 hours) at 07/11/2023 0814 Last data filed at 07/11/2023 0326 Gross per 24 hour  Intake 118 ml  Output 2075 ml  Net -1957 ml        Physical Exam: Vital Signs Blood pressure 103/81, pulse 89, temperature 98 F (36.7 C), resp. rate 18, height 5\' 9"  (1.753 m), weight 89.9 kg, SpO2 100%.        General: awake, alert, calm- fiance' feeding Leon Rodriguez; , NAD HENT: conjugate gaze; oropharynx moist CV: regular rate and rhythm; no JVD Pulmonary: CTA B/L; no W/R/R- good air movement GI: soft, NT, ND, (+)BS Psychiatric: somewhat sleepy- flat Neurological: more sleepy this Am, but talked socially  Skin- healing blister on top of L foot-stable -Area of discoloration darkened skin on right heel, does not appear to be DTI  Neurological:  Fairly awake and alert, oriented x 3.  Follows all simple commands.  1-5: Left lower extremity MAS 2 knee extensors, MAS 1 plantar flexors, MAS 2 adductors Right lower extremity MAS 3-4 knee extensors, MAS 3 plantarflexors, MAS 3 adductors. Fairly consistent with prior exams 1/13 Strength: Moving bilateral upper extremities antigravity against resistance; bilateral lower extremities not quite antigravity in hip flexion Sensation: Altered below T4  Prior:  Comments: Ue's  5-/5 throughout on exam B/L RLE- HF 2-/5; KE 2-/5; DF 4-/5; and PF 4-/5 LLE- HF 2-/5; KE 2-/5; DF 2/5; PF 2/5- spasticity still present Sensory level at T4 and below-      Assessment/Plan: 1. Functional deficits which require 3+ hours per day of interdisciplinary therapy in a comprehensive inpatient rehab setting. Physiatrist is providing close team supervision and 24 hour management of active medical problems listed below. Physiatrist and rehab team continue to assess barriers to discharge/monitor patient progress toward functional and medical goals  Care Tool:  Bathing    Body parts bathed by patient: Face   Body parts bathed by helper: Right arm, Left arm, Chest, Abdomen, Front perineal area, Buttocks, Right upper leg, Left upper leg, Right lower leg, Left lower leg (wife assisted Leon Rodriguez with bath prior to OT evaluation)     Bathing assist Assist Level: Total Assistance - Patient < 25%     Upper Body Dressing/Undressing Upper body dressing   What is the patient wearing?: Pull over shirt    Upper body assist Assist Level: Minimal Assistance - Patient > 75%  Lower Body Dressing/Undressing Lower body dressing      What is the patient wearing?: Pants, Incontinence brief     Lower body assist Assist for lower body dressing: Total Assistance - Patient < 25%     Toileting Toileting    Toileting assist Assist for toileting: 2 Helpers     Transfers Chair/bed transfer  Transfers assist     Chair/bed transfer assist level: 2 Helpers     Locomotion Ambulation   Ambulation assist   Ambulation activity did not occur: Safety/medical concerns (limited by extreme tone)          Walk 10 feet activity   Assist  Walk 10 feet activity did not occur: Safety/medical concerns        Walk 50 feet activity   Assist Walk 50 feet with 2 turns activity did not occur: Safety/medical concerns         Walk 150 feet activity   Assist Walk 150 feet activity did  not occur: Safety/medical concerns         Walk 10 feet on uneven surface  activity   Assist Walk 10 feet on uneven surfaces activity did not occur: Safety/medical concerns         Wheelchair     Assist Is the patient using a wheelchair?: Yes Type of Wheelchair: Manual Wheelchair activity did not occur: Safety/medical concerns  Wheelchair assist level: Dependent - Patient 0% Max wheelchair distance: 150    Wheelchair 50 feet with 2 turns activity    Assist    Wheelchair 50 feet with 2 turns activity did not occur: Safety/medical concerns   Assist Level: Dependent - Patient 0%   Wheelchair 150 feet activity     Assist  Wheelchair 150 feet activity did not occur: Safety/medical concerns   Assist Level: Dependent - Patient 0%   Blood pressure 103/81, pulse 89, temperature 98 F (36.7 C), resp. rate 18, height 5\' 9"  (1.753 m), weight 89.9 kg, SpO2 100%.  Medical Problem List and Plan: 1. Functional deficits secondary to Cervical myelopathy s/p ACDF- back after sepsis             -patient may  shower             -ELOS/Goals: 3-4 weeks- min A hopefully- likely mod to max A Grounds pass ordered  D/c 07/17/23 Con't CIR Leon Rodriguez and OT- going to OR tomorrow Going to OR and off unit for Posterior fusion of C7-T1 by Dr Dawley- Surgery at 2pm Friday 1/17  2.  Antithrombotics: -DVT/anticoagulation:  Pharmaceutical: Lovenox             -antiplatelet therapy: N/A 3. Pain Management/spasticity:  MS contin 15 mg bid, Oxycodone prn. Lidoderm patch for local measures. Tylenol 650 mg qid.             --bilateral hip pain with attempts at rolling.              --on Baclofen 20 mg qid, Valium 5 mg qid with Zanaflex 4 mg qid prn for spasticity.  -12/27 Started Duloxetine 20 mg at bedtime 12/25- titrate up every 3-4 days as tolerated  -bumped baclofen to 25mg  qid, consider scheduling tizanidine  -added gabapentin 100mg  tid for injury level/chest wall pain/spasticity 12/29-  pushing antispasmodics given the severity of his spasticity. Continue to titrate neuro-sedation permitting -increased baclofen to 30mg  qid 12/28 -continue valium, consider scheduled tizanidine also -increased duloxetine (now in am) to 30mg  -Leon Rodriguez refuses to take gabapentin as he doesn't want  more potentially sedating medication--I stopped it yesterday 12/30- will add Dantrolene 50 mg at bedtime- explained it's gradual onset- will increase q5 days 12/31- will stop Dantrolene- so sleepy this AM- calling Dr Jake Samples to see what he thinks about placing ITB pump sooner than later? 1/1- educated fiance and Leon Rodriguez that cannot do ITB pump due to lack of flow of CSF- p[er NSU- reason couldn't get LP for CT myelogram Will wait for now on Keppra, but if sedation gets better, can try 1/2- sinc ehaving confusion, will stop Zanaflex- in rare cases, can cause hallucinations (I've sene in other patients) and will give more baclofen prn 10 mg TID prn 1/3- tone if anything looking worse- don't know what else can do right now except Keppra, but will need to reduce Valium, or something else to try the Keppra 1/4:  discussed with patient, wish to wait until primary team is back on Monday to make major medication adjustments/add Keppra- 1/5: tone remains severe, again patient wishes major medication adjustments per primary team, but could likely retry Zanaflex given confusion has not improved 1/6- will add keppra 250 mg BID and reduce MS Contin to 1x/day in evening. To reduce sedation during day.  1/7- pain more in AM yesterday, but did better overnight- let's see if Keppra helps or hurts?will d/w SCI docs to see what opther options there might be 1/8- Oxy was what made Leon Rodriguez confused yesterday per wife- wil sop and change to Norco- less chance of confusion 1/9- very sleepy this AM- will d/w nursing why more today? 1/10- delay in getting pain meds today and Vlaium- d/w PA 1/11 continues to have poor body wide pain, sounds like  possibly dantrolene may be helping however difficult to assess as patient is more agitated today.  Does not appear this is making him sedated thus far.  Will see if we can get him different mattress 1/13 Leon Rodriguez feels dantrium is helping. Will try day time dose as well.  1/16- a little more sleepy-  4. Mood/Behavior/Sleep: LCSW to follow for evaluation and support.              -antipsychotic agents: N/A  -12/27--trazodone and melatonin helpful 12/28 doing better with earlier scheduling of trazodone and melatonin 1/2- having some confusion at night- not sure if due to Zanaflex, which I sotpped, but will give Seroquel 25 mg at bedtime to help as well -d/c trazodone 1/6- will increase Duloxetine to 60 mg at bedtime- since sedated during day, to reduce daytime sedation 1/9- will make seroquel 75 mg at bedtime PRN and see if that helps am sedation- I'm also wondering if increase in Duloxetine making him sleepier- will check on him again today.  1/13 fairly alert. Observe with addition of day time dantrium 1/14- no increased sedation- still doesn't sleep well at night, but decreased confusion 5. Neuropsych/cognition: This patient is? capable of making decisions on his own behalf. 6. Skin/Wound Care: Routine pressure relief measures.   -has developed left heel sore d/t tone, struggles to fit in [facility]  -heel dressed, ordered prevalon boots so that he's better able to tolerate wear-- -12/29 prevalon boots in place this am--tolerating -1/4: right heel ?DTI on exam--more consistent with skin color variation.  Other wounds appear improving/stable.  7. Hyponatremia: continue to monitor Na weekly  12/30- Na 139  8. Klebsiella aerogenes bacteremia/sepsis /UTI: levaquin  -abx completed per ID recs--7 days  9. Neurogenic bowel: Continue Senna S 2 tabs BID. Miralax daily-->has been incontinent of bowel and  question due to antibiotics and/or laxatives --Suppository daily after supper-->has refused for past 3  nights.  --would prefer enema three times a week as at home. Have suggested using mini enema 1/7- LBM last night with Enemeze 1/8- refused bowel program tonight- wants a break 1/9- Refused bowel program again last night- concerned his constipation will get him more spastic 1/11- fiance did bowel program last night 1/11 continue to encourage regular bowel program, LBM 1/9 1/13 LBM with bowel program  1/14- LBM last night, but small last 2 nights- hard balls- will add miralax as well as give Sorbitol to improve results.  1/15- added Colace 100 mg daily and giving another 60cc Sorbitol to get cleaned out before surgery 10. Neurogenic bladder: Foley was replaced on acute-->will d/c and start bladder program. (patient would like to resume)   1/6- will change caths to q4 hours scheduled to reduce spasticity  1/7- fiance doing most of caths  1/11 cath volumes appear overall appropriate  1/12 IC volume 650 and 600 this morning, may have waited a little too long between ICU overnight  1/14- caths lower volumes than normal- will ask wife to push fluids  1/16- cath volumes doing better 11. Neuropathy/at level SCI pain: Was on Cymbalta PTA-resumed at 20 mg at bedtime, continue--increased to 30mg  daily effective 12/28  12/30- doesn't want Cymbalta for pain, but willing to take for mood  1-5: See #4 above  1/6- will increase Duloxetine to 60 mg at bedtime for pain/mood.  12. Abd pain/gas/at level SCI pain  1/6- will add Gasx 160 mg QID and also Keppra 250 mg BID   1/7- No major changes today- will monitor closely  1/10- will stop Keppra-no improvement     LOS: 23 days A FACE TO FACE EVALUATION WAS PERFORMED  Leon Rodriguez 07/11/2023, 8:14 AM

## 2023-07-11 NOTE — Progress Notes (Signed)
IP Rehab Bowel Program Documentation   Bowel Program Start time 1800  Dig Stim Indicated? Yes  Dig Stim Prior to Suppository or mini Enema X 2   Output from dig stim: None  Ordered intervention: Suppository Yes , mini enema No ,   Repeat dig stim after Suppository or Mini enema  X 3,  Output? Moderate   Bowel Program Complete? Yes , handoff given yes  Patient Tolerated? Yes   Dominica Severin RN

## 2023-07-11 NOTE — Progress Notes (Signed)
Occupational Therapy Session Note  Patient Details  Name: Leon Rodriguez MRN: [id number] Date of Birth: April 16, 1966  Today's Date: 07/11/2023 OT Individual Time: 4098-1191 OT Individual Time Calculation (min): 60 min    Short Term Goals: Week 4:  OT Short Term Goal 1 (Week 4): Pt will perform TB transfers with mod A+1 on a level surface OT Short Term Goal 2 (Week 4): Pt will perform LB dressing with max A at bed level OT Short Term Goal 3 (Week 4): Pt will complete UB dressing with min A seated EOB or in w/c  Skilled Therapeutic Interventions/Progress Updates:    PT missed 15 mins skilled OT services at beginning of session for nursing care (I/O.) OT intervention with focus on bed mobiity, TB transfers, BUE therex, BLE PROM, and activity tolerance. Supine>sit EOB with mod A. TB transfer to w/c with mod A. BUE therex in w/c with 4# barbells reaching out and up. Biceps curls with 5# bar bell 3x10. Ball taps with 3# bar-3x10. BLE spasms increased and pt requested to return to bed. Max A for TB tranfser to bed with pt exhibiting RLE etxtensor tone and LLE flexor tone. Pt remained in bed with all needs within reach. SO present.  Therapy Documentation Precautions:  Precautions Precautions: Fall, Cervical Precaution Comments: hx of many falls, severe B LE hypertonia with muscle spasms, verbally went through cervical precautions throughout session Required Braces or Orthoses: Cervical Brace Cervical Brace: Hard collar, Other (comment) (OOB) Restrictions Weight Bearing Restrictions Per Provider Order: Yes Other Position/Activity Restrictions: cervical precautions General: General OT Amount of Missed Time: 15 Minutes   Pain:  Ongoing BLE and trunk spasms; gentle stretching with temporary relief  Therapy/Group: Individual Therapy  Rich Brave 07/11/2023, 10:48 AM

## 2023-07-11 NOTE — Progress Notes (Signed)
Physical Therapy Discharge Summary  Patient Details  Name: Leon Rodriguez MRN: [id number] Date of Birth: Jun 20, 1966  Date of Discharge from PT service:July 11, 2023  Today's Date: 07/11/2023 PT Individual Time: 4782-9562 PT Individual Time Calculation (min): 59 min    Patient has met 0 of 6 long term goals due to return to acute for surgery.  Patient to discharge at a wheelchair level Total Assist.   Patient's care partner is independent to provide the necessary physical assistance at discharge.  Reasons goals not met: D/c prior to ELOS  Recommendation:  Patient will benefit from ongoing skilled PT services in  acute care  to continue to advance safe functional mobility, address ongoing impairments in sitting balance, tone management, and minimize fall risk.  Equipment: No equipment provided  Reasons for discharge: change in medical status  Patient/family agrees with progress made and goals achieved: Yes  PT Discharge Precautions/Restrictions Precautions Precautions: Fall;Cervical Precaution Booklet Issued: Yes (comment) Precaution Comments: hx of many falls, severe B LE hypertonia with muscle spasms, verbally went through cervical precautions throughout session Required Braces or Orthoses: Cervical Brace Cervical Brace: Hard collar;Other (comment) Restrictions Weight Bearing Restrictions Per Provider Order: No Other Position/Activity Restrictions: cervical precautions Vital Signs Therapy Vitals Temp: 97.8 F (36.6 C) Temp Source: Oral Pulse Rate: 92 BP: 136/77 Patient Position (if appropriate): Sitting Oxygen Therapy SpO2: 100 % O2 Device: Room Air Pain   Pain Interference Pain Interference Pain Effect on Sleep: 4. Almost constantly Pain Interference with Therapy Activities: 4. Almost constantly Pain Interference with Day-to-Day Activities: 1. Rarely or not at all Vision/Perception  Vision - History Ability to See in Adequate Light: 0  Adequate Perception Perception: Within Functional Limits Praxis Praxis: WFL  Cognition Overall Cognitive Status: Within Functional Limits for tasks assessed Arousal/Alertness: Awake/alert Orientation Level: Oriented X4 Attention: Focused;Sustained Focused Attention: Appears intact Sustained Attention: Appears intact Memory: Appears intact Awareness: Appears intact Problem Solving: Impaired Safety/Judgment: Appears intact Sensation Sensation Light Touch: Impaired Detail Peripheral sensation comments: diminished sensation in B LEs, can feel deep touch Light Touch Impaired Details: Impaired RLE;Impaired LLE Hot/Cold: Appears Intact Proprioception: Impaired Detail Proprioception Impaired Details: Impaired LLE;Impaired RLE Stereognosis: Not tested Coordination Gross Motor Movements are Fluid and Coordinated: No Fine Motor Movements are Fluid and Coordinated: No Coordination and Movement Description: impaired due to B LE muscle spasms, weakness, and sensory deficits Motor  Motor Motor: Abnormal tone;Paraplegia;Other (comment);Abnormal postural alignment and control Motor - Skilled Clinical Observations: severe B LE hypertonia with muscle spasms, B LE weakness, and sensory deficits  Mobility Bed Mobility Bed Mobility: Not assessed Supine to Sit: Maximal Assistance - Patient - Patient 25-49% Sit to Supine: Total Assistance - Patient < 25% Transfers Transfers: Lateral/Scoot Transfers (SB transfer) Lateral/Scoot Transfers: Moderate Assistance - Patient 50-74%;Other/comment Lateral Transfer Comment: verbal and tactile cues for forward lean, head/hips relationship, technique, sequencing, and posture Locomotion  Gait Ambulation: No Gait Gait: No Stairs / Additional Locomotion Stairs: No Pick up small object from the floor (from standing position) activity did not occur: Safety/medical concerns Wheelchair Mobility Wheelchair Mobility: No  Trunk/Postural Assessment  Cervical  Assessment Cervical Assessment: Exceptions to [facility] Cervical AROM Overall Cervical AROM: Due to precautions Cervical Strength Overall Cervical Strength: Due to precautions Thoracic Assessment Thoracic Assessment: Exceptions to [name] (due to precautions) Lumbar Assessment Lumbar Assessment: Exceptions to [facility] (due to precautions) Postural Control Postural Control: Deficits on evaluation Trunk Control: impaired and stays in a posterior pelvic tilt position due to B LE extensor tone  Balance  Balance Balance Assessed: Yes Static Sitting Balance Static Sitting - Balance Support: Feet supported;Bilateral upper extremity supported Static Sitting - Level of Assistance: 5: Stand by assistance Dynamic Sitting Balance Dynamic Sitting - Balance Support: Feet supported;During functional activity Dynamic Sitting - Level of Assistance: 3: Mod assist Sitting balance - Comments: poor sitting balance seated EOB, difficulty placing BLEs in comfortable position to allow for proper sitting balance, v/c required for weight shift and trunk lean for righting responses Static Standing Balance Static Standing - Level of Assistance: Not tested (comment) Dynamic Standing Balance Dynamic Standing - Level of Assistance: Not tested (comment) Extremity Assessment  RUE Assessment RUE Assessment: Within Functional Limits LUE Assessment LUE Assessment: Within Functional Limits RLE Assessment RLE Assessment: Exceptions to [facility] Passive Range of Motion (PROM) Comments: pt limited in all LE ROM d/t tone General Strength Comments: unable to accurately assess d/t tone RLE Tone RLE Tone: Severe;Other (comment) RLE Tone Comments: has muscle spasms and increased tone in B LE causing his legs to scissor and adduct LLE Assessment LLE Assessment: Exceptions to [facility] Passive Range of Motion (PROM) Comments: all LE ROM limited d/t tone General Strength Comments: unable to accurately assess d/t tone LLE Tone LLE Tone:  Severe;Other (comment) LLE Tone Comments: spasms in LEs with adductor bias causing his legs to cross  Skilled Therapeutic Interventions/Progress Updates Pt received supine in bed with HOB elevated and is agreeable to therapy session focusing on LE tone management and UE strength. Discussed likely rehab course after surgery and current surgical precautions. Pt reports unrated pain around his midsection, likening it to a python constricting around him. No intervention required.   Pt donned cervical collar before moving from supine<>sit with maxA for LE management and modA for trunk control in seated. Pt boosted to EOB with modA x2 and v/c for head/hips relationship. Seated at EOB, pt performed x2 standing trials with maxA x2 for knee and hip extension, trunk control, and preventing LE flexor tone. Pt maintained semi-upright position for ~5s each time and reported significant pain in shoulder. Standing trials were d/c d/t shoulder pain and significantly increased tone in BLE compared to previous sessions.   Pt performed slide board transfer to w/c with modA, going slightly downhill, with v/c for hand placement and trunk lean position. Pt dependently transferred to therapy gym. Pt attempted seated chest press with tidal tank and 3# weight bar but discontinued both d/t back and R burning forearm pain. Pt practiced trunk leans with squigz on mirror with CGA. Pt dependently transferred back to room in w/c before performing another slide board transfer with modA to seated at EOB. Pt moved from seated<>supine with maxA for LE management and trunk positioning. Pt left supine in bed with alarm on and all needs in reach.     Collins Scotland 07/11/2023, 4:39 PM

## 2023-07-11 NOTE — Plan of Care (Signed)
Neurological changes returning to surgery unable to complete CIR program/ meet goals.

## 2023-07-12 ENCOUNTER — Encounter (HOSPITAL_COMMUNITY)
Admission: AD | Disposition: A | Discharge status: Still In Hospital | Payer: Self-pay | Source: Intra-hospital | Attending: Physical Medicine and Rehabilitation

## 2023-07-12 ENCOUNTER — Inpatient Hospital Stay (HOSPITAL_COMMUNITY)
Admission: AD | Admit: 2023-07-12 | Discharge: 2023-07-17 | DRG: 473 | Disposition: A | Discharge status: Rehabilitation Facility | Payer: Medicaid Other | Source: Intra-hospital | Attending: Neurological Surgery | Admitting: Neurological Surgery

## 2023-07-12 ENCOUNTER — Inpatient Hospital Stay (HOSPITAL_COMMUNITY): Payer: Medicaid Other

## 2023-07-12 ENCOUNTER — Encounter (HOSPITAL_COMMUNITY): Payer: Self-pay | Admitting: Neurological Surgery

## 2023-07-12 ENCOUNTER — Other Ambulatory Visit: Payer: Self-pay

## 2023-07-12 ENCOUNTER — Encounter (HOSPITAL_COMMUNITY)
Admission: AD | Disposition: A | Discharge status: Rehabilitation Facility | Payer: Self-pay | Source: Intra-hospital | Attending: Neurological Surgery

## 2023-07-12 DIAGNOSIS — Z79899 Other long term (current) drug therapy: Secondary | ICD-10-CM

## 2023-07-12 DIAGNOSIS — M4712 Other spondylosis with myelopathy, cervical region: Principal | ICD-10-CM | POA: Diagnosis present

## 2023-07-12 DIAGNOSIS — R252 Cramp and spasm: Secondary | ICD-10-CM | POA: Insufficient documentation

## 2023-07-12 DIAGNOSIS — G959 Disease of spinal cord, unspecified: Secondary | ICD-10-CM

## 2023-07-12 DIAGNOSIS — M4803 Spinal stenosis, cervicothoracic region: Secondary | ICD-10-CM | POA: Diagnosis present

## 2023-07-12 DIAGNOSIS — F17210 Nicotine dependence, cigarettes, uncomplicated: Secondary | ICD-10-CM | POA: Diagnosis present

## 2023-07-12 DIAGNOSIS — K592 Neurogenic bowel, not elsewhere classified: Secondary | ICD-10-CM | POA: Diagnosis not present

## 2023-07-12 DIAGNOSIS — Z79891 Long term (current) use of opiate analgesic: Secondary | ICD-10-CM | POA: Diagnosis not present

## 2023-07-12 DIAGNOSIS — N319 Neuromuscular dysfunction of bladder, unspecified: Secondary | ICD-10-CM | POA: Diagnosis not present

## 2023-07-12 DIAGNOSIS — Z4789 Encounter for other orthopedic aftercare: Secondary | ICD-10-CM | POA: Diagnosis not present

## 2023-07-12 DIAGNOSIS — G8220 Paraplegia, unspecified: Secondary | ICD-10-CM | POA: Diagnosis not present

## 2023-07-12 DIAGNOSIS — Z87440 Personal history of urinary (tract) infections: Secondary | ICD-10-CM | POA: Diagnosis not present

## 2023-07-12 DIAGNOSIS — M4802 Spinal stenosis, cervical region: Secondary | ICD-10-CM | POA: Diagnosis present

## 2023-07-12 DIAGNOSIS — Z8619 Personal history of other infectious and parasitic diseases: Secondary | ICD-10-CM | POA: Diagnosis not present

## 2023-07-12 DIAGNOSIS — M4713 Other spondylosis with myelopathy, cervicothoracic region: Secondary | ICD-10-CM | POA: Diagnosis present

## 2023-07-12 DIAGNOSIS — M1990 Unspecified osteoarthritis, unspecified site: Secondary | ICD-10-CM | POA: Diagnosis present

## 2023-07-12 HISTORY — PX: POSTERIOR CERVICAL FUSION/FORAMINOTOMY: SHX5038

## 2023-07-12 LAB — TYPE AND SCREEN
ABO/RH(D): O POS
Antibody Screen: NEGATIVE

## 2023-07-12 SURGERY — POSTERIOR CERVICAL FUSION/FORAMINOTOMY LEVEL 2
Anesthesia: General

## 2023-07-12 MED ORDER — SODIUM CHLORIDE 0.9 % IV SOLN: INTRAVENOUS | Status: DC | PRN

## 2023-07-12 MED ORDER — CEFAZOLIN SODIUM-DEXTROSE 2-4 GM/100ML-% IV SOLN
2.0000 g | Freq: Four times a day (QID) | INTRAVENOUS | Status: AC
  Administered 2023-07-13 (×2): 2 g via INTRAVENOUS
  Filled 2023-07-12 (×2): qty 100

## 2023-07-12 MED ORDER — THROMBIN 20000 UNITS EX SOLR
CUTANEOUS | Status: DC | PRN
  Administered 2023-07-12: 20 mL via TOPICAL

## 2023-07-12 MED ORDER — DEXAMETHASONE SODIUM PHOSPHATE 10 MG/ML IJ SOLN
INTRAMUSCULAR | Status: DC | PRN
  Administered 2023-07-12: 10 mg via INTRAVENOUS

## 2023-07-12 MED ORDER — OXYCODONE HCL 5 MG PO TABS
5.0000 mg | ORAL_TABLET | ORAL | Status: DC | PRN
  Administered 2023-07-13 – 2023-07-16 (×9): 5 mg via ORAL
  Filled 2023-07-12 (×12): qty 1

## 2023-07-12 MED ORDER — ALBUMIN HUMAN 5 % IV SOLN: INTRAVENOUS | Status: DC | PRN

## 2023-07-12 MED ORDER — ONDANSETRON HCL 4 MG/2ML IJ SOLN
INTRAMUSCULAR | Status: AC
  Filled 2023-07-12: qty 4

## 2023-07-12 MED ORDER — KETAMINE HCL 50 MG/5ML IJ SOSY
PREFILLED_SYRINGE | INTRAMUSCULAR | Status: AC
  Filled 2023-07-12: qty 5

## 2023-07-12 MED ORDER — FENTANYL CITRATE (PF) 250 MCG/5ML IJ SOLN
INTRAMUSCULAR | Status: AC
  Filled 2023-07-12: qty 5

## 2023-07-12 MED ORDER — BUPIVACAINE-EPINEPHRINE (PF) 0.5% -1:200000 IJ SOLN
INTRAMUSCULAR | Status: AC
  Filled 2023-07-12: qty 30

## 2023-07-12 MED ORDER — ROCURONIUM BROMIDE 10 MG/ML (PF) SYRINGE
PREFILLED_SYRINGE | INTRAVENOUS | Status: DC | PRN
  Administered 2023-07-12: 70 mg via INTRAVENOUS
  Administered 2023-07-12: 20 mg via INTRAVENOUS

## 2023-07-12 MED ORDER — MIDAZOLAM HCL 2 MG/2ML IJ SOLN
INTRAMUSCULAR | Status: DC | PRN
  Administered 2023-07-12: 2 mg via INTRAVENOUS

## 2023-07-12 MED ORDER — THROMBIN 5000 UNITS EX SOLR
CUTANEOUS | Status: AC
  Filled 2023-07-12: qty 5000

## 2023-07-12 MED ORDER — ACETAMINOPHEN 10 MG/ML IV SOLN
INTRAVENOUS | Status: DC | PRN
  Administered 2023-07-12: 1000 mg via INTRAVENOUS

## 2023-07-12 MED ORDER — LIDOCAINE-EPINEPHRINE 1 %-1:100000 IJ SOLN
INTRAMUSCULAR | Status: DC | PRN
  Administered 2023-07-12: 5 mL

## 2023-07-12 MED ORDER — BUPIVACAINE LIPOSOME 1.3 % IJ SUSP
INTRAMUSCULAR | Status: DC | PRN
  Administered 2023-07-12: 20 mL

## 2023-07-12 MED ORDER — ONDANSETRON HCL 4 MG/2ML IJ SOLN
4.0000 mg | Freq: Four times a day (QID) | INTRAMUSCULAR | Status: DC | PRN
Start: 2023-07-12 — End: 2023-07-17

## 2023-07-12 MED ORDER — SUGAMMADEX SODIUM 200 MG/2ML IV SOLN
INTRAVENOUS | Status: DC | PRN
  Administered 2023-07-12: 200 mg via INTRAVENOUS

## 2023-07-12 MED ORDER — LACTATED RINGERS IV SOLN: INTRAVENOUS | Status: DC

## 2023-07-12 MED ORDER — CHLORHEXIDINE GLUCONATE CLOTH 2 % EX PADS: 6.0000 | MEDICATED_PAD | Freq: Once | CUTANEOUS | Status: DC

## 2023-07-12 MED ORDER — OXYCODONE HCL 5 MG/5ML PO SOLN: 5.0000 mg | Freq: Once | ORAL | Status: DC | PRN

## 2023-07-12 MED ORDER — LIDOCAINE 2% (20 MG/ML) 5 ML SYRINGE
INTRAMUSCULAR | Status: DC | PRN
  Administered 2023-07-12: 100 mg via INTRAVENOUS

## 2023-07-12 MED ORDER — KETAMINE HCL 10 MG/ML IJ SOLN
INTRAMUSCULAR | Status: DC | PRN
  Administered 2023-07-12: 50 mg via INTRAVENOUS

## 2023-07-12 MED ORDER — PHENYLEPHRINE HCL-NACL 20-0.9 MG/250ML-% IV SOLN
INTRAVENOUS | Status: DC | PRN
  Administered 2023-07-12: 50 ug/min via INTRAVENOUS

## 2023-07-12 MED ORDER — OXYCODONE HCL 5 MG PO TABS: 5.0000 mg | ORAL_TABLET | Freq: Once | ORAL | Status: DC | PRN

## 2023-07-12 MED ORDER — CEFAZOLIN SODIUM-DEXTROSE 2-4 GM/100ML-% IV SOLN
2.0000 g | INTRAVENOUS | Status: DC
  Administered 2023-07-12: 2 g via INTRAVENOUS
  Filled 2023-07-12: qty 100

## 2023-07-12 MED ORDER — EPHEDRINE 5 MG/ML INJ
INTRAVENOUS | Status: AC
  Filled 2023-07-12: qty 5

## 2023-07-12 MED ORDER — PHENYLEPHRINE 80 MCG/ML (10ML) SYRINGE FOR IV PUSH (FOR BLOOD PRESSURE SUPPORT)
PREFILLED_SYRINGE | INTRAVENOUS | Status: AC
  Filled 2023-07-12: qty 30

## 2023-07-12 MED ORDER — ACETAMINOPHEN 325 MG PO TABS
ORAL_TABLET | ORAL | Status: AC
  Filled 2023-07-12: qty 2

## 2023-07-12 MED ORDER — MORPHINE SULFATE ER 15 MG PO TBCR
15.0000 mg | EXTENDED_RELEASE_TABLET | Freq: Every day | ORAL | Status: DC
  Administered 2023-07-12 – 2023-07-16 (×5): 15 mg via ORAL
  Filled 2023-07-12 (×5): qty 1

## 2023-07-12 MED ORDER — BACITRACIN ZINC 500 UNIT/GM EX OINT
TOPICAL_OINTMENT | CUTANEOUS | Status: AC
  Filled 2023-07-12: qty 28.35

## 2023-07-12 MED ORDER — PROPOFOL 10 MG/ML IV BOLUS
INTRAVENOUS | Status: AC
  Filled 2023-07-12: qty 20

## 2023-07-12 MED ORDER — KETOROLAC TROMETHAMINE 15 MG/ML IJ SOLN
15.0000 mg | Freq: Four times a day (QID) | INTRAMUSCULAR | Status: AC
  Administered 2023-07-12 – 2023-07-13 (×4): 15 mg via INTRAVENOUS
  Filled 2023-07-12 (×4): qty 1

## 2023-07-12 MED ORDER — ORAL CARE MOUTH RINSE: 15.0000 mL | Freq: Once | OROMUCOSAL | Status: DC

## 2023-07-12 MED ORDER — CHLORHEXIDINE GLUCONATE 0.12 % MT SOLN
15.0000 mL | Freq: Once | OROMUCOSAL | Status: AC
  Administered 2023-07-12: 15 mL via OROMUCOSAL

## 2023-07-12 MED ORDER — BACITRACIN 500 UNIT/GM EX OINT
TOPICAL_OINTMENT | CUTANEOUS | Status: DC | PRN
  Administered 2023-07-12: 1 via TOPICAL

## 2023-07-12 MED ORDER — CHLORHEXIDINE GLUCONATE 0.12 % MT SOLN
OROMUCOSAL | Status: AC
  Filled 2023-07-12: qty 15

## 2023-07-12 MED ORDER — BUPIVACAINE-EPINEPHRINE (PF) 0.5% -1:200000 IJ SOLN
INTRAMUSCULAR | Status: DC | PRN
  Administered 2023-07-12: 5 mL

## 2023-07-12 MED ORDER — LIDOCAINE-EPINEPHRINE 1 %-1:100000 IJ SOLN
INTRAMUSCULAR | Status: AC
  Filled 2023-07-12: qty 1

## 2023-07-12 MED ORDER — FENTANYL CITRATE (PF) 100 MCG/2ML IJ SOLN
INTRAMUSCULAR | Status: AC
  Filled 2023-07-12: qty 2

## 2023-07-12 MED ORDER — OXYCODONE HCL 5 MG PO TABS
ORAL_TABLET | ORAL | Status: AC
  Administered 2023-07-12: 5 mg
  Filled 2023-07-12: qty 1

## 2023-07-12 MED ORDER — FENTANYL CITRATE (PF) 100 MCG/2ML IJ SOLN
25.0000 ug | INTRAMUSCULAR | Status: DC | PRN
  Administered 2023-07-12: 50 ug via INTRAVENOUS

## 2023-07-12 MED ORDER — ORAL CARE MOUTH RINSE: 15.0000 mL | Freq: Once | OROMUCOSAL | Status: AC

## 2023-07-12 MED ORDER — MIDAZOLAM HCL 2 MG/2ML IJ SOLN
INTRAMUSCULAR | Status: AC
  Filled 2023-07-12: qty 2

## 2023-07-12 MED ORDER — FENTANYL CITRATE (PF) 250 MCG/5ML IJ SOLN
INTRAMUSCULAR | Status: DC | PRN
  Administered 2023-07-12: 50 ug via INTRAVENOUS
  Administered 2023-07-12: 100 ug via INTRAVENOUS

## 2023-07-12 MED ORDER — ACETAMINOPHEN 650 MG RE SUPP: 650.0000 mg | RECTAL | Status: DC | PRN

## 2023-07-12 MED ORDER — THROMBIN 5000 UNITS EX SOLR
OROMUCOSAL | Status: DC | PRN
  Administered 2023-07-12: 5 mL via TOPICAL

## 2023-07-12 MED ORDER — CHLORHEXIDINE GLUCONATE CLOTH 2 % EX PADS
6.0000 | MEDICATED_PAD | Freq: Once | CUTANEOUS | Status: AC
  Administered 2023-07-12: 6 via TOPICAL

## 2023-07-12 MED ORDER — LIDOCAINE 2% (20 MG/ML) 5 ML SYRINGE
INTRAMUSCULAR | Status: AC
  Filled 2023-07-12: qty 5

## 2023-07-12 MED ORDER — PROPOFOL 10 MG/ML IV BOLUS
INTRAVENOUS | Status: DC | PRN
  Administered 2023-07-12: 30 mg via INTRAVENOUS
  Administered 2023-07-12: 100 mg via INTRAVENOUS
  Administered 2023-07-12: 20 mg via INTRAVENOUS
  Administered 2023-07-12: 100 mg via INTRAVENOUS

## 2023-07-12 MED ORDER — PHENYLEPHRINE HCL (PRESSORS) 10 MG/ML IV SOLN
INTRAVENOUS | Status: AC
  Filled 2023-07-12: qty 1

## 2023-07-12 MED ORDER — ONDANSETRON HCL 4 MG/2ML IJ SOLN
INTRAMUSCULAR | Status: DC | PRN
  Administered 2023-07-12: 4 mg via INTRAVENOUS

## 2023-07-12 MED ORDER — DROPERIDOL 2.5 MG/ML IJ SOLN: 0.6250 mg | Freq: Once | INTRAMUSCULAR | Status: DC | PRN

## 2023-07-12 MED ORDER — THROMBIN 20000 UNITS EX SOLR
CUTANEOUS | Status: AC
  Filled 2023-07-12: qty 20000

## 2023-07-12 MED ORDER — PHENYLEPHRINE 80 MCG/ML (10ML) SYRINGE FOR IV PUSH (FOR BLOOD PRESSURE SUPPORT)
PREFILLED_SYRINGE | INTRAVENOUS | Status: DC | PRN
  Administered 2023-07-12: 240 ug via INTRAVENOUS
  Administered 2023-07-12 (×2): 160 ug via INTRAVENOUS
  Administered 2023-07-12: 80 ug via INTRAVENOUS
  Administered 2023-07-12: 160 ug via INTRAVENOUS

## 2023-07-12 MED ORDER — ONDANSETRON HCL 4 MG PO TABS
4.0000 mg | ORAL_TABLET | Freq: Four times a day (QID) | ORAL | Status: DC | PRN
Start: 2023-07-12 — End: 2023-07-17

## 2023-07-12 MED ORDER — BUPIVACAINE LIPOSOME 1.3 % IJ SUSP
INTRAMUSCULAR | Status: AC
  Filled 2023-07-12: qty 20

## 2023-07-12 MED ORDER — DEXAMETHASONE SODIUM PHOSPHATE 10 MG/ML IJ SOLN
INTRAMUSCULAR | Status: AC
  Filled 2023-07-12: qty 2

## 2023-07-12 MED ORDER — ACETAMINOPHEN 325 MG PO TABS
650.0000 mg | ORAL_TABLET | ORAL | Status: DC | PRN
  Administered 2023-07-17: 650 mg via ORAL
  Filled 2023-07-12 (×2): qty 2

## 2023-07-12 MED ORDER — ACETAMINOPHEN 10 MG/ML IV SOLN
INTRAVENOUS | Status: AC
  Filled 2023-07-12: qty 100

## 2023-07-12 MED ORDER — 0.9 % SODIUM CHLORIDE (POUR BTL) OPTIME
TOPICAL | Status: DC | PRN
  Administered 2023-07-12: 1000 mL

## 2023-07-12 MED ORDER — CHLORHEXIDINE GLUCONATE 0.12 % MT SOLN: 15.0000 mL | Freq: Once | OROMUCOSAL | Status: DC

## 2023-07-12 SURGICAL SUPPLY — 71 items
BAG COUNTER SPONGE SURGICOUNT (BAG) ×1 IMPLANT
BIT DRILL NEURO 2X3.1 SFT TUCH (MISCELLANEOUS) ×1 IMPLANT
BLADE BONE MILL MEDIUM (MISCELLANEOUS) IMPLANT
BNDG GAUZE DERMACEA FLUFF 4 (GAUZE/BANDAGES/DRESSINGS) IMPLANT
BUR MATCHSTICK NEURO 3.0 LAGG (BURR) IMPLANT
BUR SABER NEURO 2.5 (BURR) IMPLANT
CNTNR URN SCR LID CUP LEK RST (MISCELLANEOUS) ×1 IMPLANT
COVER BACK TABLE 60X90IN (DRAPES) ×1 IMPLANT
COVER MAYO STAND STRL (DRAPES) ×1 IMPLANT
DERMABOND ADVANCED .7 DNX12 (GAUZE/BANDAGES/DRESSINGS) ×1 IMPLANT
DRAIN JACKSON RD 7FR 3/32 (WOUND CARE) IMPLANT
DRAPE C-ARM 42X72 X-RAY (DRAPES) ×1 IMPLANT
DRAPE LAPAROTOMY 100X72X124 (DRAPES) ×1 IMPLANT
DRAPE MICROSCOPE SLANT 54X150 (MISCELLANEOUS) ×1 IMPLANT
DRAPE SURG 17X23 STRL (DRAPES) ×1 IMPLANT
DRILL NEURO 2X3.1 SOFT TOUCH (MISCELLANEOUS) ×1 IMPLANT
DRSG OPSITE 4X5.5 SM (GAUZE/BANDAGES/DRESSINGS) IMPLANT
DRSG OPSITE POSTOP 4X6 (GAUZE/BANDAGES/DRESSINGS) ×1 IMPLANT
DRSG OPSITE POSTOP 4X8 (GAUZE/BANDAGES/DRESSINGS) IMPLANT
DURAPREP 26ML APPLICATOR (WOUND CARE) ×1 IMPLANT
ELECT BLADE INSULATED 4IN (ELECTROSURGICAL) IMPLANT
ELECT BLADE INSULATED 6.5IN (ELECTROSURGICAL) IMPLANT
ELECT COATED BLADE 2.86 ST (ELECTRODE) ×1 IMPLANT
ELECT REM PT RETURN 9FT ADLT (ELECTROSURGICAL) ×1 IMPLANT
ELECTRODE BLADE INSULATED 4IN (ELECTROSURGICAL) IMPLANT
ELECTRODE BLDE INSULATED 6.5IN (ELECTROSURGICAL) IMPLANT
ELECTRODE REM PT RTRN 9FT ADLT (ELECTROSURGICAL) ×1 IMPLANT
EVACUATOR 1/8 PVC DRAIN (DRAIN) IMPLANT
GAUZE 4X4 16PLY ~~LOC~~+RFID DBL (SPONGE) IMPLANT
GAUZE SPONGE 4X4 12PLY STRL (GAUZE/BANDAGES/DRESSINGS) IMPLANT
GLOVE BIO SURGEON STRL SZ7 (GLOVE) ×1 IMPLANT
GLOVE BIOGEL PI IND STRL 7.5 (GLOVE) ×1 IMPLANT
GLOVE BIOGEL PI IND STRL 8 (GLOVE) ×1 IMPLANT
GLOVE ECLIPSE 8.0 STRL XLNG CF (GLOVE) ×1 IMPLANT
GOWN STRL REUS W/ TWL LRG LVL3 (GOWN DISPOSABLE) IMPLANT
GOWN STRL REUS W/ TWL XL LVL3 (GOWN DISPOSABLE) ×2 IMPLANT
GOWN STRL REUS W/TWL 2XL LVL3 (GOWN DISPOSABLE) IMPLANT
HEMOSTAT POWDER KIT SURGIFOAM (HEMOSTASIS) ×1 IMPLANT
KIT BASIN OR (CUSTOM PROCEDURE TRAY) ×1 IMPLANT
KIT TURNOVER KIT B (KITS) ×1 IMPLANT
MARKER SKIN DUAL TIP RULER LAB (MISCELLANEOUS) ×1 IMPLANT
NDL BLUNT 18X1 FOR OR ONLY (NEEDLE) IMPLANT
NDL HYPO 21X1.5 SAFETY (NEEDLE) IMPLANT
NDL HYPO 25X1 1.5 SAFETY (NEEDLE) ×1 IMPLANT
NDL SPNL 18GX3.5 QUINCKE PK (NEEDLE) ×2 IMPLANT
NEEDLE BLUNT 18X1 FOR OR ONLY (NEEDLE) IMPLANT
NEEDLE HYPO 21X1.5 SAFETY (NEEDLE) ×1 IMPLANT
NEEDLE HYPO 25X1 1.5 SAFETY (NEEDLE) ×1 IMPLANT
NEEDLE SPNL 18GX3.5 QUINCKE PK (NEEDLE) ×2 IMPLANT
NS IRRIG 1000ML POUR BTL (IV SOLUTION) ×1 IMPLANT
PACK LAMINECTOMY NEURO (CUSTOM PROCEDURE TRAY) ×1 IMPLANT
PAD ARMBOARD 7.5X6 YLW CONV (MISCELLANEOUS) ×3 IMPLANT
PATTIES SURGICAL .5 X1 (DISPOSABLE) IMPLANT
ROD PRE CUT 3.5X40MM SPINAL (Rod) IMPLANT
ROD PRE-CUT 3.5X30 (Rod) IMPLANT
SCREW MA PED IFIX 4.5X18 (Screw) IMPLANT
SCREW MULTI AXIAL 3.5X14MM (Screw) IMPLANT
SET SCREW INFINITY IFIX THOR (Screw) IMPLANT
SPIKE FLUID TRANSFER (MISCELLANEOUS) ×1 IMPLANT
SPONGE SURGIFOAM ABS GEL 100 (HEMOSTASIS) ×1 IMPLANT
SPONGE T-LAP 4X18 ~~LOC~~+RFID (SPONGE) IMPLANT
STAPLER VISISTAT 35W (STAPLE) IMPLANT
SUT VIC AB 1 CT1 18XBRD ANBCTR (SUTURE) ×1 IMPLANT
SUT VIC AB 2-0 CP2 18 (SUTURE) ×1 IMPLANT
SUT VIC AB 3-0 SH 8-18 (SUTURE) ×1 IMPLANT
SYR 20ML LL LF (SYRINGE) IMPLANT
SYR 3ML LL SCALE MARK (SYRINGE) ×1 IMPLANT
TOWEL GREEN STERILE (TOWEL DISPOSABLE) ×1 IMPLANT
TOWEL GREEN STERILE FF (TOWEL DISPOSABLE) ×1 IMPLANT
TRAY FOLEY MTR SLVR 16FR STAT (SET/KITS/TRAYS/PACK) ×1 IMPLANT
WATER STERILE IRR 1000ML POUR (IV SOLUTION) ×1 IMPLANT

## 2023-07-12 NOTE — Anesthesia Procedure Notes (Signed)
Procedure Name: Intubation Date/Time: 07/12/2023 3:44 PM  Performed by: Little Ishikawa, CRNAPre-anesthesia Checklist: Patient identified, Emergency Drugs available, Suction available, Timeout performed and Patient being monitored Patient Re-evaluated:Patient Re-evaluated prior to induction Oxygen Delivery Method: Circle system utilized Preoxygenation: Pre-oxygenation with 100% oxygen Induction Type: IV induction Ventilation: Mask ventilation without difficulty and Oral airway inserted - appropriate to patient size Laryngoscope Size: Glidescope and 4 Grade View: Grade I Tube type: Oral Tube size: 7.5 mm Number of attempts: 1 Airway Equipment and Method: Stylet, Rigid stylet and Video-laryngoscopy Placement Confirmation: ETT inserted through vocal cords under direct vision, positive ETCO2, CO2 detector and breath sounds checked- equal and bilateral Secured at: 23 cm Tube secured with: Tape Dental Injury: Teeth and Oropharynx as per pre-operative assessment

## 2023-07-12 NOTE — Op Note (Signed)
Providing Compassionate, Quality Care - Together  Date of service: 07/12/2023  PREOP DIAGNOSIS:  C6-T1 cervical stenosis with myelopathy  POSTOP DIAGNOSIS: Same  PROCEDURE: Posterior cervical lateral mass/pedicle screw instrumentation, posterior lateral arthrodesis C6-7, C7-T1 Bilateral C6, C7, T1 laminectomies for decompression of neural elements Lateral mass fixation, bilateral C6, C7: Medtronic 3.5 x 14 mm bilaterally at C6 and C7 Bilateral T1 pedicle screw instrumentation 4.5 x 18 mm bilaterally, Medtronic Intraoperative use of microscope for microdissection  SURGEON: Dr. Kendell Bane C. Hopie Pellegrin, DO  ASSISTANT: [name], PA  ANESTHESIA: General Endotracheal  EBL: 100 cc  SPECIMENS: None  DRAINS: Medium Hemovac, subfascial  COMPLICATIONS: None  CONDITION: Hemodynamically stable  HISTORY: Leon Rodriguez is a 58 y.o. male that presented with severe myelopathy with severe stenosis at C6/T1.  He underwent anterior cervical discectomy and fusion and initially tolerated this well.  He began having worsening spasticity and lower extremity weakness.  We attempted multiple CT myelogram's however this was unable to be obtained, MRI although significantly motion degraded revealed residual stenosis at C7-T1 therefore offered him posterior cervical instrumentation and fusion, laminectomies at C6-T1.  We discussed all risks, benefits and expected outcomes as well as alternatives to treatment.  Informed consent was obtained and witnessed.  PROCEDURE IN DETAIL: The patient was brought to the operating room. After induction of general anesthesia, he was placed in a head holder and the patient was positioned on the operative table in the prone position. All pressure points were meticulously padded. Skin incision was then marked out and prepped and draped in the usual sterile fashion. Physician driven timeout was performed.  Local anesthetic was injected into the planned incision.  Using 10  blade, incision was made sharply over the C6/T1 spinous processes.  Using sharp dissection, this was carried down to the dorsal fascia.  Using Bovie electrocautery, bilateral subperiosteal dissection was performed to expose the bilateral C6 lamina C6-7 facets, C7 lamina, C7-T1 facets, and T1 lamina.  Lateral fluoroscopy confirmed the appropriate level.  Microscope was sterilely draped and brought into the field for the remainder of the procedure for microdissection.  Using high-speed drill, bilateral troughs were created at the lamina facet junction at C6, C7 bilaterally.  The ligamentous attachments were removed with Kerrison rongeurs and the bilateral C6 and C7 laminas were elevated and removed and kept for autograft for later use.  Using the high-speed drill, further laminectomy was performed bilaterally at T1 down to the epidural space.  Using Kerrison rongeurs the lateral recesses were decompressed, the medial C7 and T1 facets were removed with Kerrison rongeurs.  Epidural space was explored, the remaining ligamentum flavum along the lateral recess at C6-7 was also removed with Kerrison rongeurs.  Using ball-tipped probe, the lateral recesses appeared decompressed and I could identify the lateral thecal sac.  Epidural hemostasis was achieved with Surgifoam.  The C6-7 and C7-T1 joints were decorticated with a high-speed drill.  Using high-speed drill I then created pilot holes in the C6 and C7 lateral masses in the superior lateral trajectory.  Using a 3.0 mm tap, the lateral masses were tapped to a depth of 14 mm.  I then used a 3.5 mm tap for the C7 lateral masses to a depth of 14 mm.  Using ball-tipped probe, there were bony borders in all directions.  The above lateral mass screws were placed with appropriate bony purchase, there did appear to be early cracking of the C7 lateral mass on the right however upon pulling on the  screw, did feel as though it had appropriate purchase.  Using high-speed drill,  the T1 pedicles were then accessed.  Using a pedicle finder, the pedicles were accessed to a depth of 20 mm.  Using ball-tipped probe there were bony borders in all directions and a bony floor.  Using a 3.5 mm tap, they were tapped to a depth of 18 mm.  Using ball-tipped probe there were bony borders in all directions.  The above listed pedicle screws were placed with appropriate bony purchase.  We then measured, placed and contoured appropriate size rods bilaterally.  Setscrews were placed and final tightened to the manufacturer's recommendation.  The thecal sac was covered and Gelfoam.  The wound was copiously irrigated and noted to be excellently hemostatic.  Deep retractors were taken out of the wound.  Autograft was placed along the lateral gutters from C6-T1 bilaterally.  A medium Hemovac was tunneled laterally and placed in the subfascial space.  The wound was then closed in layers after placement of long-acting local anesthetic into the soft tissues.  Muscle and fascia was closed with 1 Vicryl suture, dermis was closed with 2-0 and 3-0 Vicryl suture.  Skin was closed with skin glue.  Sterile dressing was applied.  At the end of the case all sponge, needle, and instrument counts were correct. The patient was then transferred to the stretcher, taken out of the head holder and extubated, and taken to the post-anesthesia care unit in stable hemodynamic condition.

## 2023-07-12 NOTE — Progress Notes (Signed)
Inpatient Rehabilitation Discharge Medication Review by a Pharmacist  A complete drug regimen review was completed for this patient to identify any potential clinically significant medication issues.  High Risk Drug Classes Is patient taking? Indication by Medication  Antipsychotic Yes Seroquel - sleep/mood, compazine - nausea/vomiting  Anticoagulant No   Antibiotic No   Opioid Yes morphine for pain   Antiplatelet No   Hypoglycemics/insulin No   Vasoactive Medication No   Chemotherapy No   Other Yes Baclofen, diazepam for muscle spasms Dantrolene - spacticity  Duloxetine for nerve pain Lidocaine patch for MSK pain  Lidocaine jelly for penile pain Tamsulosin for neurogenic bladder Trazodone for sleep Mucinex - mucus/cough Senokot, colace, fleet, sorbitol - constipation Simethicone - flatus      Type of Medication Issue Identified Description of Issue Recommendation(s)  Drug Interaction(s) (clinically significant)     Duplicate Therapy     Allergy     No Medication Administration End Date     Incorrect Dose     Additional Drug Therapy Needed     Significant med changes from prior encounter (inform family/care partners about these prior to discharge).    Other       Clinically significant medication issues were identified that warrant physician communication and completion of prescribed/recommended actions by midnight of the next day:  No  Pharmacist comments: discharging to inpatient  Thank you for allowing pharmacy to be a part of this patient's care.   Signe Colt, PharmD 07/12/2023 12:38 PM  **Pharmacist phone directory can be found on [email or web address] listed under [facility]**

## 2023-07-12 NOTE — Progress Notes (Signed)
Pt on NPO effective Midnight 07/12/23 except sips of water with medication per provider's order. Pre op checklist pending.   Dominica Severin  RN

## 2023-07-12 NOTE — Transfer of Care (Signed)
Immediate Anesthesia Transfer of Care Note  Patient: Leon Rodriguez  Procedure(s) Performed: POSTERIOR CERVICAL LAMINECTOMY, INSTRUMENTATION AND FUSION C6-T1 POSTERIOR CERVICAL LAMINECTOMY, INSTRUMENTATION AND FUSION CERVICAL SIX-CERVICAL SEVEN, CERVICAL SEVEN-THORACIC ONE  Patient Location: PACU  Anesthesia Type:General  Level of Consciousness: alert   Airway & Oxygen Therapy: Patient Spontanous Breathing and Patient connected to face mask oxygen  Post-op Assessment: Report given to RN and Post -op Vital signs reviewed and stable  Post vital signs: Reviewed and stable  Last Vitals:  Vitals Value Taken Time  BP 97/67 07/12/23 1845  Temp    Pulse 100 07/12/23 1855  Resp 21 07/12/23 1855  SpO2 97 % 07/12/23 1855  Vitals shown include unfiled device data.  Last Pain:  Vitals:   07/12/23 1413  PainSc: 4          Complications: No notable events documented.

## 2023-07-12 NOTE — Anesthesia Postprocedure Evaluation (Signed)
Anesthesia Post Note  Patient: Leon Rodriguez  Procedure(s) Performed: POSTERIOR CERVICAL LAMINECTOMY, INSTRUMENTATION AND FUSION C6-T1 POSTERIOR CERVICAL LAMINECTOMY, INSTRUMENTATION AND FUSION CERVICAL SIX-CERVICAL SEVEN, CERVICAL SEVEN-THORACIC ONE     Patient location during evaluation: PACU Anesthesia Type: General Level of consciousness: awake and alert Pain management: pain level controlled Vital Signs Assessment: post-procedure vital signs reviewed and stable Respiratory status: spontaneous breathing, nonlabored ventilation, respiratory function stable and patient connected to nasal cannula oxygen Cardiovascular status: blood pressure returned to baseline and stable Postop Assessment: no apparent nausea or vomiting Anesthetic complications: no   No notable events documented.  Last Vitals:  Vitals:   07/12/23 2200 07/12/23 2218  BP: 123/84 (!) 135/92  Pulse: (!) 103 (!) 104  Resp: (!) 28 19  Temp:  36.4 C  SpO2: 100% 99%    Last Pain:  Vitals:   07/12/23 2218  TempSrc: Oral  PainSc:                  Nelle Don Karim Aiello

## 2023-07-12 NOTE — H&P (Signed)
Providing Compassionate, Quality Care - Together  NEUROSURGERY HISTORY & PHYSICAL   Leon Rodriguez is an 58 y.o. male.   Chief Complaint: Cervical myelopathy HPI: This is a 58 year old male, with complaints of worsening lower extremity weakness and spasms, he was found to have severe stenosis C6/T1 from spondylosis.  He underwent ACDF C6/T1 06/05/2023.  He tolerated the surgery well, and began having worsening spasms and weakness in his lower extremities.  He was found have a urinary tract infection, was treated for this, we attempted multiple times to get CT myelogram to evaluate for residual stenosis however lumbar access could not be obtained.  Multiple MRIs were obtained however due to his spasms the image was degraded.  He has continued to have worsening spasms and lower extremity weakness and therefore is returning from rehab today for posterior cervical decompression C6-T1 with instrumentation and fusion.  Past Medical History:  Diagnosis Date   Arthritis    Hip pain    Sepsis (HCC) 06/13/2023    Past Surgical History:  Procedure Laterality Date   ANTERIOR CERVICAL DECOMP/DISCECTOMY FUSION N/A 06/05/2023   Procedure: ANTERIOR CERVICAL DISCECTOMY AND FUSION CERVICAL SIX-SEVEN/CERVICAL SEVEN-THORACIC ONE;  Surgeon: Bethann Goo, DO;  Location: [facility] OR;  Service: Neurosurgery;  Laterality: N/A;  3C   APPENDECTOMY     CIRCUMCISION     HIATAL HERNIA REPAIR     HIP SURGERY     at 89 and 58 yo    No family history on file. Social History:  reports that he has been smoking cigarettes and cigars. He has never used smokeless tobacco. He reports that he does not currently use alcohol. He reports current drug use. Drug: Marijuana.  Allergies: No Known Allergies  Medications Prior to Admission  Medication Sig Dispense Refill   acetaminophen (TYLENOL) 325 MG tablet Take 2 tablets (650 mg total) by mouth every 6 (six) hours. 240 tablet 0   alum & mag hydroxide-simeth  (MAALOX/MYLANTA) 200-200-20 MG/5ML suspension Take 30 mLs by mouth every 4 (four) hours as needed for indigestion. 355 mL 0   baclofen (LIORESAL) 20 MG tablet Take 1 tablet (20 mg total) by mouth every 6 (six) hours. 30 each 0   bisacodyl (DULCOLAX) 10 MG suppository Place 1 suppository (10 mg total) rectally daily as needed for moderate constipation. 12 suppository 0   diazepam (VALIUM) 5 MG tablet Take 1 tablet (5 mg total) by mouth every 6 (six) hours. 30 tablet 0   famotidine (ZANTAC 360) 10 MG tablet Take 10 mg by mouth daily as needed for heartburn or indigestion.     guaiFENesin (MUCINEX) 600 MG 12 hr tablet Take 1 tablet (600 mg total) by mouth 2 (two) times daily. 60 tablet 1   guaiFENesin-dextromethorphan (ROBITUSSIN DM) 100-10 MG/5ML syrup Take 10 mLs by mouth every 6 (six) hours as needed for cough. 118 mL 0   lidocaine (LIDODERM) 5 % Place 1 patch onto the skin daily. Remove & Discard patch within 12 hours or as directed by MD 30 patch 0   lidocaine (XYLOCAINE) 2 % jelly Place 1 Application into the urethra every 6 (six) hours as needed (for cathing). 10 mL 1   menthol-cetylpyridinium (CEPACOL) 3 MG lozenge Take 1 lozenge (3 mg total) by mouth as needed for sore throat. 100 tablet 12   morphine (MS CONTIN) 15 MG 12 hr tablet Take 1 tablet (15 mg total) by mouth 2 (two) times daily. 60 tablet 0   ondansetron (ZOFRAN) 4 MG  tablet Take 1 tablet (4 mg total) by mouth every 6 (six) hours as needed for nausea or vomiting. 20 tablet 0   oxyCODONE (OXY IR/ROXICODONE) 5 MG immediate release tablet Take 1 tablet (5 mg total) by mouth every 4 (four) hours as needed for breakthrough pain. 30 tablet 0   polyethylene glycol (MIRALAX / GLYCOLAX) 17 g packet Take 17 g by mouth daily. 14 each 0   senna-docusate (SENOKOT-S) 8.6-50 MG tablet Take 2 tablets by mouth at bedtime. 60 tablet 0   tamsulosin (FLOMAX) 0.4 MG CAPS capsule Take 1 capsule (0.4 mg total) by mouth daily after supper. 30 capsule 0    tiZANidine (ZANAFLEX) 4 MG tablet Take 1 tablet (4 mg total) by mouth every 6 (six) hours as needed for muscle spasms. 30 tablet 0    Results for orders placed or performed during the hospital encounter of 06/18/23 (from the past 48 hours)  Basic metabolic panel     Status: Abnormal   Collection Time: 07/11/23  6:05 AM  Result Value Ref Range   Sodium 140 135 - 145 mmol/L   Potassium 3.6 3.5 - 5.1 mmol/L   Chloride 103 98 - 111 mmol/L   CO2 26 22 - 32 mmol/L   Glucose, Bld 105 (H) 70 - 99 mg/dL    Comment: Glucose reference range applies only to samples taken after fasting for at least 8 hours.   BUN 7 6 - 20 mg/dL   Creatinine, Ser 4.69 0.61 - 1.24 mg/dL   Calcium 9.4 8.9 - 62.9 mg/dL   GFR, Estimated >52 >84 mL/min    Comment: (NOTE) Calculated using the CKD-EPI Creatinine Equation (2021)    Anion gap 11 5 - 15    Comment: Performed at [facility], [address] [place], [place] [address]  CBC     Status: Abnormal   Collection Time: 07/11/23  6:05 AM  Result Value Ref Range   WBC 5.8 4.0 - 10.5 K/uL   RBC 3.29 (L) 4.22 - 5.81 MIL/uL   Hemoglobin 11.0 (L) 13.0 - 17.0 g/dL   HCT 01.0 (L) 27.2 - 53.6 %   MCV 96.4 80.0 - 100.0 fL   MCH 33.4 26.0 - 34.0 pg   MCHC 34.7 30.0 - 36.0 g/dL   RDW 64.4 03.4 - 74.2 %   Platelets 218 150 - 400 K/uL   nRBC 0.0 0.0 - 0.2 %    Comment: Performed at [facility], [address], [place], [place] [address]  Surgical pcr screen     Status: None   Collection Time: 07/11/23  6:27 PM   Specimen: Nasal Mucosa; Nasal Swab  Result Value Ref Range   MRSA, PCR NEGATIVE NEGATIVE   Staphylococcus aureus NEGATIVE NEGATIVE    Comment: (NOTE) The Xpert SA Assay (FDA approved for NASAL specimens in patients 32 years of age and older), is one component of a comprehensive surveillance program. It is not intended to diagnose infection nor to guide or monitor treatment. Performed at [facility], [address] [place],  [place] [address]    No results found.  ROS All per positives and negatives are listed HPI above   There were no vitals taken for this visit. Physical Exam  awake alert oriented, speech is fluent and appropriate Bilateral upper extremities are 4+/5 throughout Proximal bilateral lower extremities are 2/5 Distal right lower extremity, 3/5 Distal left lower extremity 2/5 Positive bilateral lower extremity clonus  Assessment/Plan 58 year old  male with  C6/T1 cervical spondylotic myelopathy  -OR today for posterior cervical laminectomies, instrumentation and fusion C6/T1.  We discussed all risks, benefits and expected outcomes as well as alternatives to treatment.  Informed consent was obtained and witnessed.  -An extensive discussion with the patient and his family, we discussed all risks, benefits and expected outcomes, unsure if this will help his spasm significantly because again his MRI was difficult to visualize any continued remaining stenosis at C6-7 and C7-T1.  There was no obvious hardware failure on CT scan.  They verbalized understanding.  Plan will be for him to ultimately go back to rehab hopefully in the next few days.  Thank you for allowing me to participate in this patient's care.  Please do not hesitate to call with questions or concerns.   Monia Pouch, DO Neurosurgeon [facility] [phone]

## 2023-07-12 NOTE — Progress Notes (Signed)
Inpatient Rehabilitation Care Coordinator Discharge Note   Patient Details  Name: JAIVER DENI MRN: [id number] Date of Birth: [birth year]   Discharge location: Pt d/c to acute for surgery  Length of Stay: 23 days  Discharge activity level: Max Asst  Home/community participation: limited  Patient response QM:VHQION Literacy - How often do you need to have someone help you when you read instructions, pamphlets, or other written material from your doctor or pharmacy?: Never  Patient response GE:XBMWUX Isolation - How often do you feel lonely or isolated from those around you?: Patient unable to respond  Services provided included: MD, RD, PT, OT, RN, CM, TR, Neuropsych, SW, Pharmacy  Financial Services:  Field seismologist Utilized: Media planner [place] Medicaid [employer]  Choices offered to/list presented to: patient and pt ws/o  Follow-up services arranged:  Home Health Home Health Agency: [facility] for HHPT/OT/aide         Patient response to transportation need: Is the patient able to respond to transportation needs?: Yes In the past 12 months, has lack of transportation kept you from medical appointments or from getting medications?: No In the past 12 months, has lack of transportation kept you from meetings, work, or from getting things needed for daily living?: No   Patient/Family verbalized understanding of follow-up arrangements:  Yes  Individual responsible for coordination of the follow-up plan: contact pt s/o [name]  Confirmed correct DME delivered: Gretchen Short 07/12/2023    Comments (or additional information):  Summary of Stay    Date/Time Discharge Planning CSW  07/08/23 1507 D/c to home with s/o [name]. She is the only caregiver. Reports her dtr can help PRN some evenings. Disability referral sent to [facility]. PCS referral sent from previous admission to insurance- family needs to follow-up to schedule assessment. [facility] for HHPT/OT/aide. Will confirm DME needs. SW will confirm there are no barriers to discharge. AAC  07/01/23 1056 D/c to home with s/o [name]. She is the only caregiver. Reports her dtr can help PRN some evenings. Disability referral sent to [facility]. PCS referral sent from previous admission to insurance. SW will confirm there are no barriers to discharge. AAC  06/24/23 1145 D/c to home with s/o [name]. She is the only caregiver. Reports her dtr can help PRN some evenings.Will confirm amount of support s/o can provide as initially she was returning to work on 07/10/23. SW will confirm there are no barriers to discharge. AAC       Maor Meckel A Lula Olszewski

## 2023-07-12 NOTE — Progress Notes (Addendum)
PROGRESS NOTE   Subjective/Complaints:  Pt only had 7 small hard balls on bowel program last night, however they don't want to get him cleaned out before surgery- want to wait til later/after surgery.  Will need sorbitol and soap suds enema after surgery.   Lots of spasms this AM.   Ready for surgery.    ROS:   Pt denies SOB, abd pain, CP, N/V/C/D, and vision changes  Objective:   No results found.  Recent Labs    07/11/23 0605  WBC 5.8  HGB 11.0*  HCT 31.7*  PLT 218      Recent Labs    07/11/23 0605  NA 140  K 3.6  CL 103  CO2 26  GLUCOSE 105*  BUN 7  CREATININE 0.95  CALCIUM 9.4       Intake/Output Summary (Last 24 hours) at 07/12/2023 0811 Last data filed at 07/12/2023 0724 Gross per 24 hour  Intake 598 ml  Output 1700 ml  Net -1102 ml        Physical Exam: Vital Signs Blood pressure 110/75, pulse (!) 106, temperature 98.1 F (36.7 C), resp. rate 18, height 5\' 9"  (1.753 m), weight 89.9 kg, SpO2 100%.         General: sleepy, but woke to speak with me; fiance' at bedside; NAD HENT: conjugate gaze; oropharynx moist CV: regular rate- borderline tachycardic; regular rhythm; no JVD Pulmonary: CTA B/L; no W/R/R- good air movement GI: soft, NT, ND, (+)BS Psychiatric: appropriate Neurological: alert, still a little confused   Skin- healing blister on top of L foot-stable -Area of discoloration darkened skin on right heel, does not appear to be DTI  Neurological:  Fairly awake and alert, oriented x 3.  Follows all simple commands.  1-5: Left lower extremity MAS 2 knee extensors, MAS 1 plantar flexors, MAS 2 adductors Right lower extremity MAS 3-4 knee extensors, MAS 3 plantarflexors, MAS 3 adductors. Fairly consistent with prior exams 1/13 Strength: Moving bilateral upper extremities antigravity against resistance; bilateral lower extremities not quite antigravity in hip  flexion Sensation: Altered below T4  Prior:  Comments: Ue's 5-/5 throughout on exam B/L RLE- HF 2-/5; KE 2-/5; DF 4-/5; and PF 4-/5 LLE- HF 2-/5; KE 2-/5; DF 2/5; PF 2/5- spasticity still present Sensory level at T4 and below-      Assessment/Plan: 1. Functional deficits which require 3+ hours per day of interdisciplinary therapy in a comprehensive inpatient rehab setting. Physiatrist is providing close team supervision and 24 hour management of active medical problems listed below. Physiatrist and rehab team continue to assess barriers to discharge/monitor patient progress toward functional and medical goals  Care Tool:  Bathing    Body parts bathed by patient: Right arm, Left arm, Chest, Abdomen, Front perineal area, Face   Body parts bathed by helper: Buttocks, Right upper leg, Right lower leg, Left lower leg     Bathing assist Assist Level: Moderate Assistance - Patient 50 - 74%     Upper Body Dressing/Undressing Upper body dressing   What is the patient wearing?: Pull over shirt    Upper body assist Assist Level: Minimal Assistance - Patient > 75%  Lower Body Dressing/Undressing Lower body dressing      What is the patient wearing?: Pants, Incontinence brief     Lower body assist Assist for lower body dressing: Dependent - Patient 0%     Toileting Toileting    Toileting assist Assist for toileting: 2 Helpers     Transfers Chair/bed transfer  Transfers assist     Chair/bed transfer assist level: 2 Helpers     Locomotion Ambulation   Ambulation assist   Ambulation activity did not occur: Safety/medical concerns          Walk 10 feet activity   Assist  Walk 10 feet activity did not occur: Safety/medical concerns        Walk 50 feet activity   Assist Walk 50 feet with 2 turns activity did not occur: Safety/medical concerns         Walk 150 feet activity   Assist Walk 150 feet activity did not occur: Safety/medical  concerns         Walk 10 feet on uneven surface  activity   Assist Walk 10 feet on uneven surfaces activity did not occur: Safety/medical concerns         Wheelchair     Assist Is the patient using a wheelchair?: Yes Type of Wheelchair: Manual Wheelchair activity did not occur: Safety/medical concerns  Wheelchair assist level: Dependent - Patient 0% Max wheelchair distance: 150    Wheelchair 50 feet with 2 turns activity    Assist    Wheelchair 50 feet with 2 turns activity did not occur: Safety/medical concerns   Assist Level: Dependent - Patient 0%   Wheelchair 150 feet activity     Assist  Wheelchair 150 feet activity did not occur: Safety/medical concerns   Assist Level: Dependent - Patient 0%   Blood pressure 110/75, pulse (!) 106, temperature 98.1 F (36.7 C), resp. rate 18, height 5\' 9"  (1.753 m), weight 89.9 kg, SpO2 100%.  Medical Problem List and Plan: 1. Functional deficits secondary to Cervical myelopathy s/p ACDF- back after sepsis             -patient may  shower             -ELOS/Goals: 3-4 weeks- min A hopefully- likely mod to max A Grounds pass ordered  D/c 07/17/23 Con't CIR PT and OT- going to OR tomorrow Going to OR and off unit for Posterior fusion of C7-T1 by Dr Jake Samples- Surgery at 2pm Friday 1/17 Surgery today- will be d/c'd to OR around 2pm  2.  Antithrombotics: -DVT/anticoagulation:  Pharmaceutical: Lovenox             -antiplatelet therapy: N/A 3. Pain Management/spasticity:  MS contin 15 mg bid, Oxycodone prn. Lidoderm patch for local measures. Tylenol 650 mg qid.             --bilateral hip pain with attempts at rolling.              --on Baclofen 20 mg qid, Valium 5 mg qid with Zanaflex 4 mg qid prn for spasticity.  -12/27 Started Duloxetine 20 mg at bedtime 12/25- titrate up every 3-4 days as tolerated  -bumped baclofen to 25mg  qid, consider scheduling tizanidine  -added gabapentin 100mg  tid for injury level/chest  wall pain/spasticity 12/29- pushing antispasmodics given the severity of his spasticity. Continue to titrate neuro-sedation permitting -increased baclofen to 30mg  qid 12/28 -continue valium, consider scheduled tizanidine also -increased duloxetine (now in am) to 30mg  -pt refuses to take gabapentin  as he doesn't want more potentially sedating medication--I stopped it yesterday 12/30- will add Dantrolene 50 mg at bedtime- explained it's gradual onset- will increase q5 days 12/31- will stop Dantrolene- so sleepy this AM- calling Dr Jake Samples to see what he thinks about placing ITB pump sooner than later? 1/1- educated fiance and pt that cannot do ITB pump due to lack of flow of CSF- p[er NSU- reason couldn't get LP for CT myelogram Will wait for now on Keppra, but if sedation gets better, can try 1/2- sinc ehaving confusion, will stop Zanaflex- in rare cases, can cause hallucinations (I've sene in other patients) and will give more baclofen prn 10 mg TID prn 1/3- tone if anything looking worse- don't know what else can do right now except Keppra, but will need to reduce Valium, or something else to try the Keppra 1/4:  discussed with patient, wish to wait until primary team is back on Monday to make major medication adjustments/add Keppra- 1/5: tone remains severe, again patient wishes major medication adjustments per primary team, but could likely retry Zanaflex given confusion has not improved 1/6- will add keppra 250 mg BID and reduce MS Contin to 1x/day in evening. To reduce sedation during day.  1/7- pain more in AM yesterday, but did better overnight- let's see if Keppra helps or hurts?will d/w SCI docs to see what opther options there might be 1/8- Oxy was what made pt confused yesterday per wife- wil sop and change to Norco- less chance of confusion 1/9- very sleepy this AM- will d/w nursing why more today? 1/10- delay in getting pain meds today and Vlaium- d/w PA 1/11 continues to have poor  body wide pain, sounds like possibly dantrolene may be helping however difficult to assess as patient is more agitated today.  Does not appear this is making him sedated thus far.  Will see if we can get him different mattress 1/13 pt feels dantrium is helping. Will try day time dose as well.  1/16- a little more sleepy-  1/17- will see after surgery and titrate spasticity meds as needed 4. Mood/Behavior/Sleep: LCSW to follow for evaluation and support.              -antipsychotic agents: N/A  -12/27--trazodone and melatonin helpful 12/28 doing better with earlier scheduling of trazodone and melatonin 1/2- having some confusion at night- not sure if due to Zanaflex, which I sotpped, but will give Seroquel 25 mg at bedtime to help as well -d/c trazodone 1/6- will increase Duloxetine to 60 mg at bedtime- since sedated during day, to reduce daytime sedation 1/9- will make seroquel 75 mg at bedtime PRN and see if that helps am sedation- I'm also wondering if increase in Duloxetine making him sleepier- will check on him again today.  1/13 fairly alert. Observe with addition of day time dantrium 1/14- no increased sedation- still doesn't sleep well at night, but decreased confusion 5. Neuropsych/cognition: This patient is? capable of making decisions on his own behalf. 6. Skin/Wound Care: Routine pressure relief measures.   -has developed left heel sore d/t tone, struggles to fit in [facility]  -heel dressed, ordered prevalon boots so that he's better able to tolerate wear-- -12/29 prevalon boots in place this am--tolerating -1/4: right heel ?DTI on exam--more consistent with skin color variation.  Other wounds appear improving/stable.  7. Hyponatremia: continue to monitor Na weekly  12/30- Na 139  8. Klebsiella aerogenes bacteremia/sepsis /UTI: levaquin  -abx completed per ID recs--7 days  9.  Neurogenic bowel: Continue Senna S 2 tabs BID. Miralax daily-->has been incontinent of bowel and question due  to antibiotics and/or laxatives --Suppository daily after supper-->has refused for past 3 nights.  --would prefer enema three times a week as at home. Have suggested using mini enema 1/7- LBM last night with Enemeze 1/8- refused bowel program tonight- wants a break 1/9- Refused bowel program again last night- concerned his constipation will get him more spastic 1/11- fiance did bowel program last night 1/11 continue to encourage regular bowel program, LBM 1/9 1/13 LBM with bowel program  1/14- LBM last night, but small last 2 nights- hard balls- will add miralax as well as give Sorbitol to improve results.  1/15- added Colace 100 mg daily and giving another 60cc Sorbitol to get cleaned out before surgery 10. Neurogenic bladder: Foley was replaced on acute-->will d/c and start bladder program. (patient would like to resume)   1/6- will change caths to q4 hours scheduled to reduce spasticity  1/7- fiance doing most of caths  1/11 cath volumes appear overall appropriate  1/12 IC volume 650 and 600 this morning, may have waited a little too long between ICU overnight  1/14- caths lower volumes than normal- will ask wife to push fluids  1/16- cath volumes doing better 11. Neuropathy/at level SCI pain: Was on Cymbalta PTA-resumed at 20 mg at bedtime, continue--increased to 30mg  daily effective 12/28  12/30- doesn't want Cymbalta for pain, but willing to take for mood  1-5: See #4 above  1/6- will increase Duloxetine to 60 mg at bedtime for pain/mood.  12. Abd pain/gas/at level SCI pain  1/6- will add Gasx 160 mg QID and also Keppra 250 mg BID   1/7- No major changes today- will monitor closely  1/10- will stop Keppra-no improvement  The patient is medically ready for discharge to  acute hospital       LOS: 24 days A FACE TO FACE EVALUATION WAS PERFORMED  Leon Rodriguez 07/12/2023, 8:11 AM

## 2023-07-12 NOTE — Anesthesia Preprocedure Evaluation (Addendum)
Anesthesia Evaluation  Patient identified by MRN, date of birth, ID band Patient awake    Reviewed: Allergy & Precautions, NPO status , Patient's Chart, lab work & pertinent test results  Airway Mallampati: II  TM Distance: >3 FB Neck ROM: Limited    Dental  (+) Dental Advisory Given   Pulmonary Current Smoker and Patient abstained from smoking.   breath sounds clear to auscultation       Cardiovascular negative cardio ROS  Rhythm:Regular Rate:Normal     Neuro/Psych Cervical myelopathy    GI/Hepatic negative GI ROS, Neg liver ROS,,,  Endo/Other  negative endocrine ROS    Renal/GU negative Renal ROS     Musculoskeletal  (+) Arthritis ,    Abdominal   Peds  Hematology negative hematology ROS (+)   Anesthesia Other Findings   Reproductive/Obstetrics                              Anesthesia Physical Anesthesia Plan  ASA: 2  Anesthesia Plan: General   Post-op Pain Management:    Induction: Intravenous  PONV Risk Score and Plan: 1 and Dexamethasone, Ondansetron, Treatment may vary due to age or medical condition and Midazolam  Airway Management Planned: Oral ETT and Video Laryngoscope Planned  Additional Equipment: None  Intra-op Plan:   Post-operative Plan: Extubation in OR  Informed Consent: I have reviewed the patients History and Physical, chart, labs and discussed the procedure including the risks, benefits and alternatives for the proposed anesthesia with the patient or authorized representative who has indicated his/her understanding and acceptance.     Dental advisory given  Plan Discussed with: CRNA  Anesthesia Plan Comments:         Anesthesia Quick Evaluation

## 2023-07-13 MED ORDER — BISACODYL 10 MG RE SUPP
10.0000 mg | Freq: Every day | RECTAL | Status: DC | PRN
  Administered 2023-07-13: 10 mg via RECTAL
  Filled 2023-07-13: qty 1

## 2023-07-13 MED ORDER — SENNOSIDES-DOCUSATE SODIUM 8.6-50 MG PO TABS
2.0000 | ORAL_TABLET | Freq: Every day | ORAL | Status: DC
  Administered 2023-07-13 – 2023-07-16 (×4): 2 via ORAL
  Filled 2023-07-13 (×4): qty 2

## 2023-07-13 MED ORDER — POLYETHYLENE GLYCOL 3350 17 G PO PACK
17.0000 g | PACK | Freq: Every day | ORAL | Status: DC
  Administered 2023-07-13 – 2023-07-17 (×5): 17 g via ORAL
  Filled 2023-07-13 (×5): qty 1

## 2023-07-13 MED ORDER — MENTHOL 3 MG MT LOZG: 1.0000 | LOZENGE | OROMUCOSAL | Status: DC | PRN

## 2023-07-13 MED ORDER — ACETAMINOPHEN 325 MG PO TABS
650.0000 mg | ORAL_TABLET | Freq: Four times a day (QID) | ORAL | Status: DC
  Administered 2023-07-13 – 2023-07-17 (×19): 650 mg via ORAL
  Filled 2023-07-13 (×20): qty 2

## 2023-07-13 MED ORDER — TIZANIDINE HCL 4 MG PO TABS
4.0000 mg | ORAL_TABLET | Freq: Four times a day (QID) | ORAL | Status: DC | PRN
  Administered 2023-07-13 – 2023-07-17 (×10): 4 mg via ORAL
  Filled 2023-07-13 (×11): qty 1

## 2023-07-13 MED ORDER — GUAIFENESIN ER 600 MG PO TB12
600.0000 mg | ORAL_TABLET | Freq: Two times a day (BID) | ORAL | Status: DC
  Administered 2023-07-13 – 2023-07-17 (×9): 600 mg via ORAL
  Filled 2023-07-13 (×9): qty 1

## 2023-07-13 MED ORDER — LIDOCAINE 5 % EX PTCH
1.0000 | MEDICATED_PATCH | CUTANEOUS | Status: DC
  Administered 2023-07-13 – 2023-07-15 (×3): 1 via TRANSDERMAL
  Filled 2023-07-13 (×5): qty 1

## 2023-07-13 MED ORDER — DIAZEPAM 5 MG PO TABS
5.0000 mg | ORAL_TABLET | Freq: Four times a day (QID) | ORAL | Status: DC
  Administered 2023-07-13 – 2023-07-17 (×19): 5 mg via ORAL
  Filled 2023-07-13 (×19): qty 1

## 2023-07-13 MED ORDER — TAMSULOSIN HCL 0.4 MG PO CAPS
0.4000 mg | ORAL_CAPSULE | Freq: Every day | ORAL | Status: DC
  Administered 2023-07-13 – 2023-07-15 (×3): 0.4 mg via ORAL
  Filled 2023-07-13 (×5): qty 1

## 2023-07-13 MED ORDER — SODIUM CHLORIDE 0.9% FLUSH: 3.0000 mL | INTRAVENOUS | Status: DC | PRN

## 2023-07-13 MED ORDER — GUAIFENESIN-DM 100-10 MG/5ML PO SYRP: 10.0000 mL | ORAL_SOLUTION | Freq: Four times a day (QID) | ORAL | Status: DC | PRN

## 2023-07-13 MED ORDER — ALUM & MAG HYDROXIDE-SIMETH 200-200-20 MG/5ML PO SUSP
30.0000 mL | ORAL | Status: DC | PRN
  Administered 2023-07-13: 30 mL via ORAL
  Filled 2023-07-13: qty 30

## 2023-07-13 MED ORDER — LIDOCAINE HCL URETHRAL/MUCOSAL 2 % EX GEL: 1.0000 | Freq: Four times a day (QID) | CUTANEOUS | Status: DC | PRN

## 2023-07-13 MED ORDER — BACLOFEN 10 MG PO TABS
20.0000 mg | ORAL_TABLET | Freq: Four times a day (QID) | ORAL | Status: DC
  Administered 2023-07-13 – 2023-07-17 (×19): 20 mg via ORAL
  Filled 2023-07-13 (×19): qty 2

## 2023-07-13 MED ORDER — FAMOTIDINE 20 MG PO TABS
10.0000 mg | ORAL_TABLET | Freq: Every day | ORAL | Status: DC | PRN
  Administered 2023-07-13: 10 mg via ORAL
  Filled 2023-07-13: qty 1

## 2023-07-13 MED ORDER — SODIUM CHLORIDE 0.9% FLUSH
3.0000 mL | Freq: Two times a day (BID) | INTRAVENOUS | Status: DC
  Administered 2023-07-13 – 2023-07-17 (×9): 3 mL via INTRAVENOUS

## 2023-07-13 NOTE — Progress Notes (Signed)
OT Cancellation Note  Patient Details Name: Leon Rodriguez MRN: [id number] DOB: [birth year]   Cancelled Treatment:    Reason Eval/Treat Not Completed: Patient declined, no reason specified. Per RN, pt not up to it today. OT will follow up tomorrow  Galen Manila 07/13/2023, 11:55 AM

## 2023-07-13 NOTE — Progress Notes (Signed)
In & Out urinary catheterization procedure performed at bedside without incident. of urine collected.

## 2023-07-13 NOTE — Progress Notes (Signed)
NEUROSURGERY PROGRESS NOTE  Doing well. Complains of appropriate neck soreness. Has noticed some increase in movement in his legs. Incision cdi. Continue therapies.   Temp:  [97.4 F (36.3 C)-98.8 F (37.1 C)] 98.8 F (37.1 C) (01/18 0735) Pulse Rate:  [93-114] 100 (01/18 0735) Resp:  [12-28] 15 (01/18 0735) BP: (86-135)/(54-92) 96/66 (01/18 0735) SpO2:  [96 %-100 %] 100 % (01/18 0735)  Sherryl Manges, NP 07/13/2023 10:13 AM

## 2023-07-13 NOTE — Plan of Care (Signed)

## 2023-07-13 NOTE — Progress Notes (Signed)
PT Cancellation Note  Patient Details Name: Leon Rodriguez MRN: [id number] DOB: 1966-05-10   Cancelled Treatment:    Reason Eval/Treat Not Completed: Patient declined, no reason specified.  Pt declined PT/OT and other interventions.  Will hold today and attempt eval 1/19. 07/13/2023  Jacinto Halim., PT Acute Rehabilitation Services [phone]  (office)   Eliseo Gum Azora Bonzo 07/13/2023, 4:20 PM

## 2023-07-14 NOTE — Progress Notes (Signed)
No change in exam today.  He is moving his right leg a little better than his left but seems to be moving his legs about light yesterday.  Some neck soreness which is expected.  No arm symptoms or hand symptoms.  PT OT rehab

## 2023-07-14 NOTE — Evaluation (Signed)
Physical Therapy Evaluation Patient Details Name: Leon Rodriguez MRN: [id number] DOB: 1965/11/19 Today's Date: 07/14/2023  History of Present Illness  58 y.o. male presenting from CIR 1/17 for posterior cervical decompression C6-T1 with instrumentation and fusion due to worsening BLE weakness and spasms. Pt with recent ACDF C6-T1 06/05/2023. PMH includes OA, hiatal hernia repair.   Clinical Impression  Pt in bed upon arrival of PT, agreeable to evaluation at this time. Prior to admission the pt was requiring maxA for bed mobility and working on standing trials and balance at [facility]. He and his wife remain optimistic for continued improvement in mobility and are hopeful for return to AIR when medically stable. The pt was able to complete log roll and bed mobility with maxA of 1-2 this session, he presents with increased tone in LE and limited ability to use functionally due to extensor tone and pain from muscle spasms. The pt needed modA to maintain sitting balance due to R and posterior lean. He was able to reach midline but unable to maintain despite multimodal cues and physical assistance. He was able to complete lateral scooting along EOB, but required modA of 2 using bed pad to achieve movement. Will continue to benefit from skilled PT acutely and after d/c, recommend return to inpatient rehab >3hours/day to facilitate return to maximal strength, ROM, balance, coordination, and transfers with decreased assistance.      If plan is discharge home, recommend the following: Two people to help with walking and/or transfers;Two people to help with bathing/dressing/bathroom;Assistance with cooking/housework;Assistance with feeding;Direct supervision/assist for medications management;Direct supervision/assist for financial management;Assist for transportation;Help with stairs or ramp for entrance   Can travel by private vehicle        Equipment Recommendations Other (comment) (defer to post acute)   Recommendations for Other Services  Rehab consult    Functional Status Assessment Patient has had a recent decline in their functional status and demonstrates the ability to make significant improvements in function in a reasonable and predictable amount of time.     Precautions / Restrictions Precautions Precautions: Fall;Cervical Precaution Booklet Issued: Yes (comment) Precaution Comments: hx of many falls, severe B LE hypertonia with muscle spasms, verbally went through cervical precautions throughout session Required Braces or Orthoses: Cervical Brace Cervical Brace: Hard collar;Other (comment) Restrictions Weight Bearing Restrictions Per Provider Order: No Other Position/Activity Restrictions: cervical precautions      Mobility  Bed Mobility Overal bed mobility: Needs Assistance Bed Mobility: Rolling, Sidelying to Sit, Sit to Sidelying Rolling: Mod assist, Used rails Sidelying to sit: Max assist, HOB elevated, Used rails, +2 for physical assistance     Sit to sidelying: Max assist, +2 for physical assistance General bed mobility comments: maxA to complete transition to sitting EOB, then modA to maintain sitting balance. leaning to R and posterior. unable to flex R knee to place on ground, unable to coordinate placement of LE or seated balance    Transfers Overall transfer level: Needs assistance Equipment used: None Transfers: Bed to chair/wheelchair/BSC            Lateral/Scoot Transfers: Mod assist, +2 physical assistance General transfer comment: lateral scoot transfer along EOB with modA of 2 to bed pad. pt with good ability to assist with UE    Ambulation/Gait               General Gait Details: unable at this time.    Balance Overall balance assessment: Needs assistance, History of Falls Sitting-balance support: Single extremity supported, Bilateral  upper extremity supported, No upper extremity supported, Feet supported Sitting balance-Leahy  Scale: Poor Sitting balance - Comments: modA to maintain sitting balance, pt with R lateral lean and posterior lean, unable to maintain position once assisted to midline Postural control: Posterior lean, Right lateral lean                                   Pertinent Vitals/Pain Pain Assessment Pain Assessment: Faces Faces Pain Scale: Hurts whole lot Pain Location: muscle spasm in back, BLE Pain Descriptors / Indicators: Spasm Pain Intervention(s): Limited activity within patient's tolerance, Monitored during session, Repositioned    Home Living Family/patient expects to be discharged to:: Private residence Living Arrangements: Spouse/significant other Available Help at Discharge: Family;Available 24 hours/day Type of Home: House Home Access: Stairs to enter Entrance Stairs-Rails: None Entrance Stairs-Number of Steps: 1 Alternate Level Stairs-Number of Steps: flight Home Layout: Two level;Bed/bath upstairs;1/2 bath on main level Home Equipment: Shower seat;Hand held Programmer, systems (2 wheels);Cane - single point;Wheelchair - manual;Crutches;Toilet riser Additional Comments: reports started using w/c in June 2024 - he uses the wheelchair downstairs for more mobility, goes downstairs 1x/day to smoke a cigar (but hasn't been downstairs in ~11month PTA), uses RW upstairs for very short distance gait to/from bathroom for BM otherwise uses urinal for bladder -- S.O. 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 Pt had been getting in and out of tub but has had a fall there. Per CIR notes just prior to this admission, pt completed ADLs with Min to Total assist.      Extremity/Trunk Assessment   Upper Extremity Assessment Upper Extremity Assessment: Defer to OT evaluation    Lower Extremity Assessment Lower Extremity Assessment: RLE deficits/detail;LLE deficits/detail RLE Deficits / Details: significant extensor tone, able to demo minor movement at toes, ankle, very small ROM knee flexion. unable to flex knee even once sitting EOB, reports sensation intact RLE Sensation: WNL RLE Coordination: decreased fine motor;decreased gross motor LLE Deficits / Details: pt reports sensation intact, able to demo small movements at toes, ankle, partial ROM at knee and hip but limited by spasms. incrased adductor tone LLE: Unable to fully assess due to pain LLE Sensation: WNL LLE Coordination: decreased fine motor;decreased gross motor    Cervical / Trunk Assessment Cervical / Trunk Assessment: Neck Surgery  Communication   Communication Communication: No apparent difficulties Cueing Techniques: Verbal cues;Gestural cues;Tactile cues;Visual cues  Cognition Arousal: Alert Behavior During Therapy: Flat affect                                   General Comments: pt answering questions with increased time, not formally assessed        General Comments General comments (skin integrity, edema, etc.): BP soft but stable    Exercises     Assessment/Plan    PT Assessment Patient needs continued PT services  PT Problem List Decreased strength;Decreased balance;Decreased activity tolerance;Decreased coordination;Decreased range of motion;Pain;Cardiopulmonary status  limiting activity;Decreased safety awareness;Decreased knowledge of use of DME;Decreased knowledge of precautions;Decreased mobility       PT Treatment Interventions DME instruction;Therapeutic activities;Gait training;Therapeutic exercise;Patient/family education;Balance training;Stair training;Functional mobility training;Neuromuscular re-education    PT Goals (Current goals  can be found in the Care Plan section)  Acute Rehab PT Goals Patient Stated Goal: Return to rehab PT Goal Formulation: With patient/family Time For Goal Achievement: 07/28/23 Potential to Achieve Goals: Fair    Frequency Min 1X/week     Co-evaluation PT/OT/SLP Co-Evaluation/Treatment: Yes Reason for Co-Treatment: Complexity of the patient's impairments (multi-system involvement);For patient/therapist safety;To address functional/ADL transfers PT goals addressed during session: Mobility/safety with mobility;Balance;Strengthening/ROM         AM-PAC PT "6 Clicks" Mobility  Outcome Measure Help needed turning from your back to your side while in a flat bed without using bedrails?: Total Help needed moving from lying on your back to sitting on the side of a flat bed without using bedrails?: Total Help needed moving to and from a bed to a chair (including a wheelchair)?: Total Help needed standing up from a chair using your arms (e.g., wheelchair or bedside chair)?: Total Help needed to walk in hospital room?: Total Help needed climbing 3-5 steps with a railing? : Total 6 Click Score: 6    End of Session Equipment Utilized During Treatment: Cervical collar Activity Tolerance: Patient limited by fatigue Patient left: in bed;with call bell/phone within reach;with bed alarm set;with family/visitor present (bed in chair position) Nurse Communication: Mobility status;Need for lift equipment PT Visit Diagnosis: Other abnormalities of gait and mobility (R26.89);Muscle weakness (generalized) (M62.81)    Time: 1610-9604 PT Time Calculation (min) (ACUTE ONLY): 34 min   Charges:   PT Evaluation $PT Eval Moderate Complexity: 1 Mod   PT General Charges $$ ACUTE PT VISIT: 1 Visit         Vickki Muff, PT, DPT   Acute Rehabilitation Department Office [phone] Secure Chat Communication Preferred  Ronnie Derby 07/14/2023, 3:33 PM

## 2023-07-14 NOTE — Evaluation (Signed)
Occupational Therapy Evaluation Patient Details Name: Leon Rodriguez MRN: [id number] DOB: 1965-11-14 Today's Date: 07/14/2023   History of Present Illness 58 y.o. male presenting from CIR 1/17 for posterior cervical decompression C6-T1 with instrumentation and fusion due to worsening BLE weakness and spasms. Pt with recent ACDF C6-T1 06/05/2023. PMH includes OA, hiatal hernia repair.   Clinical Impression   Pt recently admitted from CIR for above surgery. Pt supine on arrival with no brace donned; per chart, maintain aspen collar so donned at beginning of session. Prior to this admission requiring max A for bed mobility and mod A for lateral transfers. Pt and wife hopeful to return to CIR once stable. Pt presenting with continued BLE tone and spasms this session and with tingling on dorsum of R hand. Pt needing mod A +2 for bed mobility and lateral scoots this session. Able to maintain sitting balance while holding onto OT hands at EOB, but otherwise mod A for static sitting. Due to pt continuing to require significant assist and in December was Ambulatory and able to perform self care OOB, recommending intensive multidisciplinary rehabilitation >3 hours/day to optimize safety and independence in ADL.        If plan is discharge home, recommend the following: Two people to help with walking and/or transfers;Two people to help with bathing/dressing/bathroom;Assistance with cooking/housework;Assist for transportation;Help with stairs or ramp for entrance    Functional Status Assessment  Patient has had a recent decline in their functional status and demonstrates the ability to make significant improvements in function in a reasonable and predictable amount of time.  Equipment Recommendations  Other (comment) (defer)    Recommendations for Other Services Rehab consult     Precautions / Restrictions Precautions Precautions: Fall;Cervical Precaution Booklet Issued: Yes (comment) Precaution  Comments: hx of many falls, severe B LE hypertonia with muscle spasms, verbally went through cervical precautions throughout session Required Braces or Orthoses: Cervical Brace Cervical Brace: Hard collar;Other (comment) (per chart, "maintain aspen collar" was most recent order, however, pt without Aspen donned in session with wife report MD saying pt does not have to wear anymore; asked wife to go to car and retrieve and donned for session as neuro team not available) Restrictions Weight Bearing Restrictions Per Provider Order: No Other Position/Activity Restrictions: cervical precautions      Mobility Bed Mobility Overal bed mobility: Needs Assistance Bed Mobility: Rolling, Sidelying to Sit, Sit to Sidelying Rolling: Mod assist, Used rails Sidelying to sit: Max assist, HOB elevated, Used rails, +2 for physical assistance     Sit to sidelying: Max assist, +2 for physical assistance General bed mobility comments: maxA to complete transition to sitting EOB, then modA to maintain sitting balance. leaning to R and posterior. unable to flex R knee to place on ground, unable to coordinate placement of LE or seated balance    Transfers Overall transfer level: Needs assistance Equipment used: None Transfers: Bed to chair/wheelchair/BSC            Lateral/Scoot Transfers: Mod assist, +2 physical assistance General transfer comment: lateral scoot transfer along EOB with modA of 2 to bed pad. pt with good ability to assist with UE, needing assist to reposition BLE between scoots      Balance Overall balance assessment: Needs assistance, History of Falls Sitting-balance support: Single extremity supported, Bilateral upper extremity supported, No upper extremity supported, Feet supported Sitting balance-Leahy Scale: Poor Sitting balance - Comments: modA to maintain sitting balance, pt with R lateral lean and posterior lean,  unable to maintain position once assisted to midline Postural  control: Posterior lean, Right lateral lean                                 ADL either performed or assessed with clinical judgement   ADL Overall ADL's : Needs assistance/impaired Eating/Feeding: Modified independent;Bed level   Grooming: Bed level;Set up   Upper Body Bathing: Moderate assistance;Sitting   Lower Body Bathing: Total assistance;+2 for physical assistance;+2 for safety/equipment;Bed level   Upper Body Dressing : Moderate assistance;Sitting   Lower Body Dressing: Total assistance;Bed level                       Vision Baseline Vision/History: 0 No visual deficits Ability to See in Adequate Light: 0 Adequate Patient Visual Report: No change from baseline Vision Assessment?: No apparent visual deficits     Perception Perception: Within Functional Limits       Praxis Praxis: WFL       Pertinent Vitals/Pain Pain Assessment Pain Assessment: Faces Faces Pain Scale: Hurts whole lot Pain Location: muscle spasm in back, BLE Pain Descriptors / Indicators: Spasm Pain Intervention(s): Limited activity within patient's tolerance, Monitored during session     Extremity/Trunk Assessment Upper Extremity Assessment Upper Extremity Assessment: Right hand dominant;RUE deficits/detail RUE Deficits / Details: pt reports tingling on dorsum of hand. ROM WFL. Did not formally assess strength secondary to cervical precautions but able to use BUE during lateral scooting. RUE with slightly decreased coordination noted as compared to L   Lower Extremity Assessment Lower Extremity Assessment: Defer to PT evaluation RLE Deficits / Details: significant extensor tone, able to demo minor movement at toes, ankle, very small ROM knee flexion. unable to flex knee even once sitting EOB, reports sensation intact RLE Sensation: WNL RLE Coordination: decreased fine motor;decreased gross motor LLE Deficits / Details: pt reports sensation intact, able to demo small  movements at toes, ankle, partial ROM at knee and hip but limited by spasms. incrased adductor tone LLE: Unable to fully assess due to pain LLE Sensation: WNL LLE Coordination: decreased fine motor;decreased gross motor   Cervical / Trunk Assessment Cervical / Trunk Assessment: Neck Surgery (x2)   Communication Communication Communication: No apparent difficulties Cueing Techniques: Verbal cues;Gestural cues   Cognition Arousal: Alert Behavior During Therapy: Flat affect Overall Cognitive Status: Impaired/Different from baseline Area of Impairment: Following commands, Problem solving                       Following Commands: Follows one step commands with increased time     Problem Solving: Slow processing General Comments: pt answering questions with increased time, not formally assessed. Pt with some mix ups when asked to perform ROM or strength testing on LE, needing increased time and multimodal cues to [facility] as well as during mobility     General Comments  BP soft but stable with pt denying dizziness/light headedness.    Exercises     Shoulder Instructions      Home Living Family/patient expects to be discharged to:: Private residence Living Arrangements: Spouse/significant other Available Help at Discharge: Family;Available 24 hours/day Type of Home: House Home Access: Stairs to enter [employer] of Steps: 1 Entrance Stairs-Rails: None Home Layout: Two level;Bed/bath upstairs;1/2 bath on main level Alternate Level Stairs-Number of Steps: flight   Bathroom Shower/Tub: Industrial/product designer Accessibility:  Yes   Home Equipment: Shower seat;Hand held Programmer, systems (2 wheels);Cane - single point;Wheelchair - manual;Crutches;Toilet riser   Additional Comments: reports started using w/c in June 2024 - he uses the wheelchair downstairs for more mobility, goes downstairs 1x/day to smoke a cigar (but  hasn't been downstairs in ~10month PTA), uses RW upstairs for very short distance gait to/from bathroom for BM otherwise uses urinal for bladder -- S.O. 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 Pt had been getting in and out of tub but has had a fall there. Per CIR notes just prior to this admission, pt completed ADLs with Min to Total assist.        OT Problem List: Decreased strength;Impaired balance (sitting and/or standing);Pain;Impaired tone;Decreased activity tolerance;Decreased coordination;Decreased knowledge of use of DME or AE      OT Treatment/Interventions: Self-care/ADL training;Therapeutic exercise;DME and/or AE instruction;Therapeutic activities;Patient/family education;Balance training    OT Goals(Current goals can be found in the care plan section) Acute Rehab OT Goals Patient Stated Goal: get better OT Goal Formulation: With patient/family Time For Goal Achievement: 07/28/23 Potential to Achieve Goals: Fair  OT Frequency: Min 1X/week    Co-evaluation PT/OT/SLP Co-Evaluation/Treatment: Yes Reason for Co-Treatment: Complexity of the patient's impairments (multi-system involvement);For patient/therapist safety;To address functional/ADL transfers PT goals addressed during session: Mobility/safety with mobility;Balance;Strengthening/ROM        AM-PAC OT "6 Clicks" Daily Activity     Outcome Measure Help from another person eating meals?: None Help from another person taking care of personal grooming?: A Little Help from another person toileting, which includes using toliet, bedpan, or urinal?: Total Help from another person bathing (including  washing, rinsing, drying)?: A Lot Help from another person to put on and taking off regular upper body clothing?: A Lot Help from another person to put on and taking off regular lower body clothing?: Total 6 Click Score: 13   End of Session Equipment Utilized During Treatment: Cervical collar Nurse Communication: Mobility status  Activity Tolerance: Patient tolerated treatment well Patient left: in bed;with call bell/phone within reach;with bed alarm set;with family/visitor present  OT Visit Diagnosis: Other abnormalities of gait and mobility (R26.89);History of falling (Z91.81);Muscle weakness (generalized) (M62.81);Repeated falls (R29.6);Pain Pain - part of body:  (BLE)                Time: 1225-1300 OT Time Calculation (min): 35 min Charges:  OT General Charges $OT Visit: 1 Visit OT Evaluation $OT Eval Moderate Complexity: 1 Mod  Tyler Deis, OTR/L [facility] Acute Rehabilitation Office: [phone]   Myrla Halsted 07/14/2023, 5:07 PM

## 2023-07-14 NOTE — Plan of Care (Signed)
  Problem: Education: Goal: Ability to verbalize activity precautions or restrictions will improve Outcome: Progressing Goal: Knowledge of the prescribed therapeutic regimen will improve Outcome: Progressing Goal: Understanding of discharge needs will improve Outcome: Progressing   Problem: Activity: Goal: Ability to avoid complications of mobility impairment will improve Outcome: Progressing Goal: Ability to tolerate increased activity will improve Outcome: Progressing Goal: Will remain free from falls Outcome: Progressing   Problem: Bowel/Gastric: Goal: Gastrointestinal status for postoperative course will improve Outcome: Progressing   Problem: Clinical Measurements: Goal: Ability to maintain clinical measurements within normal limits will improve Outcome: Progressing Goal: Postoperative complications will be avoided or minimized Outcome: Progressing Goal: Diagnostic test results will improve Outcome: Progressing

## 2023-07-15 ENCOUNTER — Inpatient Hospital Stay (HOSPITAL_COMMUNITY): Payer: Medicaid Other

## 2023-07-15 ENCOUNTER — Encounter (HOSPITAL_COMMUNITY): Payer: Self-pay | Admitting: Neurological Surgery

## 2023-07-15 LAB — URINALYSIS, W/ REFLEX TO CULTURE (INFECTION SUSPECTED)
Bacteria, UA: NONE SEEN
Bilirubin Urine: NEGATIVE
Glucose, UA: NEGATIVE mg/dL
Hgb urine dipstick: NEGATIVE
Ketones, ur: NEGATIVE mg/dL
Leukocytes,Ua: NEGATIVE
Nitrite: NEGATIVE
Protein, ur: NEGATIVE mg/dL
Specific Gravity, Urine: 1.009 (ref 1.005–1.030)
pH: 7 (ref 5.0–8.0)

## 2023-07-15 MED ORDER — HEPARIN SODIUM (PORCINE) 5000 UNIT/ML IJ SOLN
5000.0000 [IU] | Freq: Three times a day (TID) | INTRAMUSCULAR | Status: DC
Start: 2023-07-15 — End: 2023-07-17
  Administered 2023-07-15 – 2023-07-17 (×7): 5000 [IU] via SUBCUTANEOUS
  Filled 2023-07-15 (×7): qty 1

## 2023-07-15 NOTE — Progress Notes (Signed)
    Providing Compassionate, Quality Care - Together   NEUROSURGERY PROGRESS NOTE     S: No issues overnight. Pt reports mild improvement in LE movement   O: EXAM:  BP 100/61 (BP Location: Right Arm)   Pulse (!) 104   Temp 100.3 F (37.9 C) (Axillary)   Resp 17   SpO2 97%     Awake, alert, oriented  Speech fluent, appropriate  SILTx4 Dressing c/d/I  LEs 3/5 proximally, 2/5 distally UEs strength grossly intact   ASSESSMENT:  58 y.o. with  -Cervical myelopathy s/p ACDF C6-T1, and now s/p PCDF C6-T1.     PLAN: -Cont supportive care -Therapies as tolerated.  -DVT ppx start today, SQH -Call w/ questions/concerns.   [name], [facility]

## 2023-07-15 NOTE — PMR Pre-admission (Signed)
PMR Admission Coordinator Pre-Admission Assessment  Patient: Leon Rodriguez is an 58 y.o., male MRN: [id number] DOB: Mar 01, 1966 Height:   Weight:    Insurance Information HMO:     PPO:      PCP:      IPA:      80/20:      OTHER: Group [id number] PRIMARY: Trey Sailors [facility] Medicaid      Policy#: [id number]      Subscriber: self CM Name: Levan Hurst      Phone#: [phone]  Fax#: [phone] Pre-Cert#:[id number]     Employer: Currently on LOA Benefits:  Phone #: [phone]     Name: Verified 06/06/23 Eff. Date: 03/26/23     Deduct: $0      Out of Pocket Max: $0      Life Max: n/a  CIR: 100%      SNF: 100% Outpatient:       Co-Pay: $4 copay/visit Home Health: 100%      Co-Pay: none DME: 100%     Co-Pay: none Providers: in network  SECONDARY:       Policy#:      Phone#:   Artist:       Phone#:   The Data processing manager" for patients in Inpatient Rehabilitation Facilities with attached "Privacy Act Statement-Health Care Records" was provided and verbally reviewed with: N/A  Emergency Contact Information Contact Information     Name Relation Home Work Mobile   [name],[name] Significant other   [phone]      Other Contacts   None on File     Current Medical History  Patient Admitting Diagnosis: Cervical Myelopathy History of Present Illness: A 58 y.o. male with a history of worsening bilateral lower extremity weakness and sensory loss progressing over several months.  He had progressed to the point where patient was requiring a wheelchair for mobility.  Patient developed spasticity as well.  MRI of the cervical spine revealed a C6-T1 severe cervical stenosis with cord compression and myelopathy.  Patient presented on 06/05/2023 for C6-T1 ACDF by Dr. Jake Samples.  Patient with significant lower extremity spasms this morning.  Valium was added and baclofen was increased.  He remains in a cervical collar. Pt discharged to CIR  on 06/07/23 and  transfered back to acute care on 06/13/23 due to tachycardia, fever, and suspected sepsis. Found to have bacteremia and acute cystitis. Pt With Klebsiella aerogenes, presumably from cystitis.  He completed a 7-day course of antibiotics on 06/19/2023. Pt. Admitted to CIR 06/17/23-07/12/23, but transferred off for posterior cervical decompression C6-T1 with instrumentation and fusion due to worsening BLE weakness and spasms. He was seen by PT/OT post operatively and they recommended return to CIR to assist return to PLOF.     Patient's medical record from [facility]  has been reviewed by the rehabilitation admission coordinator and physician.  Past Medical History  Past Medical History:  Diagnosis Date   Arthritis    Hip pain    Sepsis (HCC) 06/13/2023    Has the patient had major surgery during 100 days prior to admission? Yes  Family History   family history is not on file.  Current Medications  Current Facility-Administered Medications:    acetaminophen (TYLENOL) tablet 650 mg, 650 mg, Oral, Q4H PRN **OR** acetaminophen (TYLENOL) suppository 650 mg, 650 mg, Rectal, Q4H PRN, [name] [place], PA-C   acetaminophen (TYLENOL) tablet 650 mg, 650 mg, Oral, Q6H, [name] [place], PA-C, 650 mg at 07/15/23 1255  alum & mag hydroxide-simeth (MAALOX/MYLANTA) 200-200-20 MG/5ML suspension 30 mL, 30 mL, Oral, Q4H PRN, [name] [place], PA-C, 30 mL at 07/13/23 0546   baclofen (LIORESAL) tablet 20 mg, 20 mg, Oral, Q6H, [name] [place], PA-C, 20 mg at 07/15/23 1255   bisacodyl (DULCOLAX) suppository 10 mg, 10 mg, Rectal, Daily PRN, [name] [place], PA-C, 10 mg at 07/13/23 0546   diazepam (VALIUM) tablet 5 mg, 5 mg, Oral, Q6H, [name] [place], PA-C, 5 mg at 07/15/23 1255   famotidine (PEPCID) tablet 10 mg, 10 mg, Oral, Daily PRN, [name] [place], PA-C, 10 mg at 07/13/23 0800   guaiFENesin (MUCINEX) 12 hr tablet 600 mg, 600 mg, Oral, BID,  [name] [place], PA-C, 600 mg at 07/15/23 6644   guaiFENesin-dextromethorphan (ROBITUSSIN DM) 100-10 MG/5ML syrup 10 mL, 10 mL, Oral, Q6H PRN, [name] [place], PA-C   heparin injection 5,000 Units, 5,000 Units, Subcutaneous, Q8H, [name] [place], PA-C, 5,000 Units at 07/15/23 1256   lidocaine (LIDODERM) 5 % 1 patch, 1 patch, Transdermal, Q24H, [name] [place], PA-C, 1 patch at 07/15/23 0026   lidocaine (XYLOCAINE) 2 % jelly 1 Application, 1 Application, Urethral, Q6H PRN, Clovis Riley, PA-C   menthol-cetylpyridinium (CEPACOL) lozenge 3 mg, 1 lozenge, Oral, PRN, [name] [place], PA-C   morphine (MS CONTIN) 12 hr tablet 15 mg, 15 mg, Oral, Q supper, [name] [place], PA-C, 15 mg at 07/14/23 1708   ondansetron (ZOFRAN) tablet 4 mg, 4 mg, Oral, Q6H PRN **OR** ondansetron (ZOFRAN) injection 4 mg, 4 mg, Intravenous, Q6H PRN, [name] [place], PA-C   oxyCODONE (Oxy IR/ROXICODONE) immediate release tablet 5 mg, 5 mg, Oral, Q4H PRN, [name] [place], PA-C, 5 mg at 07/15/23 0347   polyethylene glycol (MIRALAX / GLYCOLAX) packet 17 g, 17 g, Oral, Daily, [name] [place], PA-C, 17 g at 07/15/23 4259   senna-docusate (Senokot-S) tablet 2 tablet, 2 tablet, Oral, QHS, [name] [place], PA-C, 2 tablet at 07/14/23 2127   sodium chloride flush (NS) 0.9 % injection 3 mL, 3 mL, Intravenous, Q12H, [name] [place], PA-C, 3 mL at 07/15/23 0947   sodium chloride flush (NS) 0.9 % injection 3 mL, 3 mL, Intravenous, PRN, [name] [place], PA-C   tamsulosin [facility]) capsule 0.4 mg, 0.4 mg, Oral, QPC supper, [name] [place], PA-C, 0.4 mg at 07/14/23 1707   tiZANidine (ZANAFLEX) tablet 4 mg, 4 mg, Oral, Q6H PRN, Clovis Riley, PA-C, 4 mg at 07/14/23 2131  Patients Current Diet:  Diet Order             Diet regular Room service appropriate? Yes; Fluid consistency: Thin  Diet effective now                    Precautions / Restrictions Precautions Precautions: Fall, Cervical Precaution Booklet Issued: Yes (comment) Precaution Comments: hx of many falls, severe B LE hypertonia with muscle spasms, verbally went through cervical precautions throughout session Cervical Brace: Hard collar, Other (comment) (per chart, "maintain aspen collar" was most recent order, however, pt without [place]en donned in session with wife report MD saying pt does not have to wear anymore; asked wife to go to car and retrieve and donned for session as neuro team not available) Restrictions Weight Bearing Restrictions Per Provider Order: No Other Position/Activity Restrictions: cervical precautions   Has the patient had 2 or more falls or a fall with injury in the past year? Yes  Prior Activity Level Household: pt. went out for  appointments  Prior Functional Level Self Care: Did the patient need help bathing, dressing, using the toilet or eating? Needed some help  Indoor Mobility: Did the patient need assistance with walking from room to room (with or without device)? Needed some help  Stairs: Did the patient need assistance with internal or external stairs (with or without device)? Needed some help  Functional Cognition: Did the patient need help planning regular tasks such as shopping or remembering to take medications? Independent  Patient Information Are you of Hispanic, Latino/a,or Spanish origin?: A. No, not of Hispanic, Latino/a, or Spanish origin What is your race?: B. Black or African American Do you need or want an interpreter to communicate with a doctor or health care staff?: 0. No  Patient's Response To:  Health Literacy and Transportation Is the patient able to respond to health literacy and transportation needs?: Yes Health Literacy - How often do you need to have someone help you when you read instructions, pamphlets, or other written material from your doctor or pharmacy?: Never In the past 12  months, has lack of transportation kept you from medical appointments or from getting medications?: No In the past 12 months, has lack of transportation kept you from meetings, work, or from getting things needed for daily living?: No  Journalist, newspaper / Equipment Home Equipment: Information systems manager, Higher education careers adviser held shower head, Agricultural consultant (2 wheels), [employer] - single point, Wheelchair - manual, Crutches, Toilet riser  Prior Device Use: Indicate devices/aids used by the patient prior to current illness, exacerbation or injury? Manual wheelchair  Current Functional Level Cognition  Overall Cognitive Status: Impaired/Different from baseline Orientation Level: Oriented X4 Following Commands: Follows one step commands with increased time General Comments: pt answering questions with increased time, not formally assessed. Pt with some mix ups when asked to perform ROM or strength testing on LE, needing increased time and multimodal cues to [facility] as well as during mobility    Extremity Assessment (includes Sensation/Coordination)  Upper Extremity Assessment: Right hand dominant, RUE deficits/detail RUE Deficits / Details: pt reports tingling on dorsum of hand. ROM WFL. Did not formally assess strength secondary to cervical precautions but able to use BUE during lateral scooting. RUE with slightly decreased coordination noted as compared to L  Lower Extremity Assessment: Defer to PT evaluation RLE Deficits / Details: significant extensor tone, able to demo minor movement at toes, ankle, very small ROM knee flexion. unable to flex knee even once sitting EOB, reports sensation intact RLE Sensation: WNL RLE Coordination: decreased fine motor, decreased gross motor LLE Deficits / Details: pt reports sensation intact, able to demo small movements at toes, ankle, partial ROM at knee and hip but limited by spasms. incrased adductor tone LLE: Unable to fully assess due to pain LLE Sensation: WNL LLE  Coordination: decreased fine motor, decreased gross motor    ADLs  Overall ADL's : Needs assistance/impaired Eating/Feeding: Modified independent, Bed level Grooming: Bed level, Set up Upper Body Bathing: Moderate assistance, Sitting Lower Body Bathing: Total assistance, +2 for physical assistance, +2 for safety/equipment, Bed level Upper Body Dressing : Moderate assistance, Sitting Lower Body Dressing: Total assistance, Bed level    Mobility  Overal bed mobility: Needs Assistance Bed Mobility: Rolling, Sidelying to Sit, Sit to Sidelying Rolling: Mod assist, Used rails Sidelying to sit: Max assist, HOB elevated, Used rails, +2 for physical assistance Sit to sidelying: Max assist, +2 for physical assistance General bed mobility comments: maxA to complete transition to sitting EOB, then modA  to maintain sitting balance. leaning to R and posterior. unable to flex R knee to place on ground, unable to coordinate placement of LE or seated balance    Transfers  Overall transfer level: Needs assistance Equipment used: None Transfers: Bed to chair/wheelchair/BSC Bed to/from chair/wheelchair/BSC transfer type:: Lateral/scoot transfer  Lateral/Scoot Transfers: Mod assist, +2 physical assistance General transfer comment: lateral scoot transfer along EOB with modA of 2 to bed pad. pt with good ability to assist with UE, needing assist to reposition BLE between scoots    Ambulation / Gait / Stairs / Wheelchair Mobility  Ambulation/Gait General Gait Details: unable at this time.    Posture / Balance Dynamic Sitting Balance Sitting balance - Comments: modA to maintain sitting balance, pt with R lateral lean and posterior lean, unable to maintain position once assisted to midline Balance Overall balance assessment: Needs assistance, History of Falls Sitting-balance support: Single extremity supported, Bilateral upper extremity supported, No upper extremity supported, Feet supported Sitting  balance-Leahy Scale: Poor Sitting balance - Comments: modA to maintain sitting balance, pt with R lateral lean and posterior lean, unable to maintain position once assisted to midline Postural control: Posterior lean, Right lateral lean    Special needs/care consideration Skin   and Special service needs     Previous Home Environment (from acute therapy documentation) Living Arrangements: Spouse/significant other  Lives With: Spouse Available Help at Discharge: Family, Available 24 hours/day Type of Home: House Home Layout: Two level, Bed/bath upstairs Alternate Level Stairs-Number of Steps: flight Home Access: Stairs to enter Entrance Stairs-Rails: None Entrance Stairs-Number of Steps: 1 Bathroom Shower/Tub: Engineer, manufacturing systems: Standard Bathroom Accessibility: Yes Home Care Services: No Additional Comments: reports started using w/c in June 2024 - he uses the wheelchair downstairs for more mobility, goes downstairs 1x/day to smoke a cigar (but hasn't been downstairs in ~3month PTA), uses RW upstairs for very short distance gait to/from bathroom for BM otherwise uses urinal for bladder -- S.O. 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 position  Discharge Living Setting Plans for Discharge Living Setting: Patient's home Type of Home at Discharge: House Discharge Home Layout: Two level, Bed/bath upstairs Alternate Level Stairs-Rails: Right Alternate Level Stairs-Number of Steps: flight Discharge Home Access: Stairs to enter Entrance Stairs-Rails: None Entrance Stairs-Number of Steps: 1 Discharge Bathroom Shower/Tub: Tub/shower unit Discharge Bathroom Toilet: Standard Discharge Bathroom Accessibility: Yes How Accessible: Accessible via walker Does the patient have any problems obtaining your medications?: No  Social/Family/Support Systems Patient Roles: Spouse Contact Information:  [name] Anticipated Caregiver's Contact Information: see contacts Ability/Limitations of Caregiver: Wife to take FMLA through the end of February Caregiver Availability: 24/7 Discharge Plan Discussed with Primary Caregiver: Yes Is Caregiver In Agreement with Plan?: Yes Does Caregiver/Family have Issues with Lodging/Transportation while Pt is in Rehab?: Yes  Goals Patient/Family Goal for Rehab: Survision to min A Expected length of stay: 14-16 days Pt/Family Agrees to Admission and willing to participate: Yes Program Orientation Provided & Reviewed with Pt/Caregiver Including Roles  & Responsibilities: Yes  Decrease burden of Care through IP rehab admission: not anticipated  Possible need for SNF placement upon discharge: not anticipated  Patient Condition: I have reviewed medical records from [facility], spoken with CM, and patient and spouse. I met with patient at the bedside for inpatient rehabilitation assessment.  Patient will benefit from ongoing PT and OT, can actively participate in  3 hours of therapy a day 5 days of the week, and can make measurable gains during the admission.  Patient will also benefit from the coordinated team approach during an Inpatient Acute Rehabilitation admission.  The patient will receive intensive therapy as well as Rehabilitation physician, nursing, social worker, and care management interventions.  Due to safety, skin/wound care, disease management, medication administration, pain management, and patient education the patient requires 24 hour a day rehabilitation nursing.  The patient is currently mod-max A with mobility and basic ADLs.  Discharge setting and therapy post discharge at home with home health is anticipated.  Patient has agreed to participate in the Acute Inpatient Rehabilitation Program and will admit today.  Preadmission Screen Completed By:  Jeronimo Greaves, 07/15/2023 2:08  PM ______________________________________________________________________   Discussed status with Dr. Natale Lay on 07/17/23 at 900 and received approval for admission today.  Admission Coordinator:  Jeronimo Greaves, CCC-SLP, time 941/Date 07/17/23   Assessment/Plan: Diagnosis: cervical spondylitic myelopathy  Does the need for close, 24 hr/day Medical supervision in concert with the patient's rehab needs make it unreasonable for this patient to be served in a less intensive setting? Yes Co-Morbidities requiring supervision/potential complications: hyponatremia, UTI, neurogenic bowel, neurogenic bladder, neuropathic pain Due to bladder management, bowel management, safety, skin/wound care, disease management, medication administration, pain management, and patient education, does the patient require 24 hr/day rehab nursing? Yes Does the patient require coordinated care of a physician, rehab nurse, PT, OT, and SLP to address physical and functional deficits in the context of the above medical diagnosis(es)? Yes Addressing deficits in the following areas: balance, endurance, locomotion, strength, transferring, bowel/bladder control, bathing, dressing, feeding, grooming, toileting, and psychosocial support Can the patient actively participate in an intensive therapy program of at least 3 hrs of therapy 5 days a week? Yes The potential for patient to make measurable gains while on inpatient rehab is excellent Anticipated functional outcomes upon discharge from inpatient rehab: supervision and min assist PT, modified independent and supervision OT, n/a SLP Estimated rehab length of stay to reach the above functional goals is: 14-16 Anticipated discharge destination: Home 10. Overall Rehab/Functional Prognosis: good   MD Signature: Fanny Dance

## 2023-07-15 NOTE — Progress Notes (Signed)
Inpatient Rehab Admissions Coordinator:    Pt. Wishes to return to CIR. Wife is planning to stay home on FMLA through the end of February. I will send case to insurance and pursue for admit.   Megan Salon, MS, CCC-SLP Rehab Admissions Coordinator  [phone] (celll) [phone] (office)

## 2023-07-15 NOTE — Progress Notes (Addendum)
Physical Therapy Treatment Patient Details Name: Leon Rodriguez MRN: [id number] DOB: 1965-07-08 Today's Date: 07/15/2023   History of Present Illness 58 y.o. male presenting from CIR 1/17 for posterior cervical decompression C6-T1 with instrumentation and fusion due to worsening BLE weakness and spasms. Pt with recent ACDF C6-T1 06/05/2023. PMH includes OA, hiatal hernia repair.    PT Comments  Pt in significant pain upon PT arrival to room, having LE spasms and states he wants to sit EOB to alleviate this. Pt requiring max PT assist for bed mobility and moving to/from EOB, once EOB pt with L and posterior bias requiring truncal assist to correct. Pt sat EOB 5-10 minutes, PT performing LE stretches to pt comfort and pt working on sitting balance. Pt requesting return to supine given LE pain, RN notified of pt pain.    tachycardic 130s-140s, pt endorsing "chest tightness" especially with pain exacerbations. RN notified    If plan is discharge home, recommend the following: Two people to help with walking and/or transfers;Two people to help with bathing/dressing/bathroom;Assistance with cooking/housework;Assistance with feeding;Direct supervision/assist for medications management;Direct supervision/assist for financial management;Assist for transportation;Help with stairs or ramp for entrance   Can travel by private vehicle        Equipment Recommendations  Other (comment) (defer)    Recommendations for Other Services       Precautions / Restrictions Precautions Precautions: Fall;Cervical Precaution Booklet Issued: Yes (comment) Precaution Comments: hx of many falls, severe B LE hypertonia with muscle spasms Required Braces or Orthoses: Cervical Brace Cervical Brace: Hard collar;Other (comment) ("maintain aspen collar" is the order. However, pt and wife state neurosurgery d/c need for collar at this time and was not donned upon PT arrival. PT placed aspen collar on pt in sitting and  removed with return to supine as pt was found upon arrival) Restrictions Weight Bearing Restrictions Per Provider Order: No     Mobility  Bed Mobility Overal bed mobility: Needs Assistance Bed Mobility: Rolling, Sidelying to Sit, Sit to Sidelying Rolling: Mod assist Sidelying to sit: Max assist     Sit to sidelying: Max assist General bed mobility comments: assist for trunk and LE management, righting posture to upright as EOB as pt leaning L and posteriorly    Transfers                   General transfer comment: nt - pt in too much pain    Ambulation/Gait                   Stairs             Wheelchair Mobility     Tilt Bed    Modified Rankin (Stroke Patients Only)       Balance Overall balance assessment: Needs assistance, History of Falls Sitting-balance support: Single extremity supported, Bilateral upper extremity supported, No upper extremity supported, Feet supported Sitting balance-Leahy Scale: Poor Sitting balance - Comments: assist for correcting L lateral and posterior bias, sitting EOB x5 minutes limited by LE spasms R>L and pain Postural control: Posterior lean, Right lateral lean                                  Cognition Arousal: Alert Behavior During Therapy: Flat affect Overall Cognitive Status: Impaired/Different from baseline Area of Impairment: Following commands, Problem solving  Following Commands: Follows one step commands with increased time     Problem Solving: Slow processing General Comments: internally distracted by pain        Exercises Other Exercises Other Exercises: hamstring stretch in sitting, slow and controlled with pause at end range, x5 bilat    General Comments General comments (skin integrity, edema, etc.): tachycardic 130s-140s, pt endorsing "chest tightness" especially with pain exacerbations. RN notified      Pertinent Vitals/Pain Pain  Assessment Pain Assessment: Faces Faces Pain Scale: Hurts whole lot Pain Location: LE spasms Pain Descriptors / Indicators: Spasm, Grimacing, Guarding, Moaning Pain Intervention(s): Limited activity within patient's tolerance, Monitored during session, Repositioned, Patient requesting pain meds-RN notified    Home Living                          Prior Function            PT Goals (current goals can now be found in the care plan section) Acute Rehab PT Goals Patient Stated Goal: Return to rehab PT Goal Formulation: With patient/family Time For Goal Achievement: 07/28/23 Potential to Achieve Goals: Fair Progress towards PT goals: Progressing toward goals    Frequency    Min 1X/week      PT Plan      Co-evaluation              AM-PAC PT "6 Clicks" Mobility   Outcome Measure  Help needed turning from your back to your side while in a flat bed without using bedrails?: Total Help needed moving from lying on your back to sitting on the side of a flat bed without using bedrails?: Total Help needed moving to and from a bed to a chair (including a wheelchair)?: Total Help needed standing up from a chair using your arms (e.g., wheelchair or bedside chair)?: Total Help needed to walk in hospital room?: Total Help needed climbing 3-5 steps with a railing? : Total 6 Click Score: 6    End of Session Equipment Utilized During Treatment: Cervical collar Activity Tolerance: Patient limited by pain Patient left: in bed;with call bell/phone within reach;with bed alarm set;with family/visitor present Nurse Communication: Mobility status PT Visit Diagnosis: Other abnormalities of gait and mobility (R26.89);Muscle weakness (generalized) (M62.81)     Time: 0454-0981 PT Time Calculation (min) (ACUTE ONLY): 19 min  Charges:    $Therapeutic Activity: 8-22 mins PT General Charges $$ ACUTE PT VISIT: 1 Visit                     Marye Round, PT DPT Acute Rehabilitation  Services Secure Chat Preferred  Office [phone]    Truddie Coco 07/15/2023, 4:38 PM

## 2023-07-15 NOTE — Plan of Care (Signed)
  Problem: Education: Goal: Ability to verbalize activity precautions or restrictions will improve Outcome: Progressing Goal: Knowledge of the prescribed therapeutic regimen will improve Outcome: Progressing Goal: Understanding of discharge needs will improve Outcome: Progressing   Problem: Activity: Goal: Ability to avoid complications of mobility impairment will improve Outcome: Progressing Goal: Ability to tolerate increased activity will improve Outcome: Progressing Goal: Will remain free from falls Outcome: Progressing   Problem: Bowel/Gastric: Goal: Gastrointestinal status for postoperative course will improve Outcome: Progressing

## 2023-07-15 NOTE — Plan of Care (Signed)

## 2023-07-16 MED ORDER — CHLORHEXIDINE GLUCONATE CLOTH 2 % EX PADS
6.0000 | MEDICATED_PAD | Freq: Every day | CUTANEOUS | Status: DC
  Administered 2023-07-16 – 2023-07-17 (×2): 6 via TOPICAL

## 2023-07-16 NOTE — Plan of Care (Signed)

## 2023-07-16 NOTE — Progress Notes (Signed)
Inpatient Rehab Admissions Coordinator:   CIR following. Insurance remains pending. I will follow for potential admit pending insurance auth.   Megan Salon, MS, CCC-SLP Rehab Admissions Coordinator  [phone] (celll) [phone] (office)

## 2023-07-16 NOTE — Addendum Note (Signed)
Addendum  created 07/16/23 0806 by Shelton Silvas, MD   Attestation recorded in Intraprocedure, Intraprocedure Attestations filed

## 2023-07-16 NOTE — Progress Notes (Signed)
   Providing Compassionate, Quality Care - Together  NEUROSURGERY PROGRESS NOTE   S: No issues overnight.  Feels as though his movement in his legs is improving  O: EXAM:  BP 105/68 (BP Location: Left Arm)   Pulse (!) 105   Temp 98.9 F (37.2 C) (Oral)   Resp 13   SpO2 98%   Awake, alert, oriented x 3 PERRL Speech fluent, appropriate  CNs grossly intact  5/5 BUE 2/5 proximal BLE, 4 -/5 distal Dressing in place Hemovac in place  ASSESSMENT:  58 y.o. male with   C6/T1 cervical spondylitic myelopathy  -Status post posterior cervical decompression and fusion, C6-T1  PLAN: -Pending rehab -DVT prophylaxis -Remove Hemovac   Thank you for allowing me to participate in this patient's care.  Please do not hesitate to call with questions or concerns.   Monia Pouch, DO Neurosurgeon [facility] [phone]

## 2023-07-16 NOTE — Progress Notes (Signed)
Physical Therapy Treatment Patient Details Name: Leon Rodriguez MRN: [id number] DOB: [birth year] Today's Date: 07/16/2023   History of Present Illness 58 y.o. male presenting from CIR 1/17 for posterior cervical decompression C6-T1 with instrumentation and fusion due to worsening BLE weakness and spasms. Pt with recent ACDF C6-T1 06/05/2023. PMH includes OA, hiatal hernia repair.    PT Comments  Pt sleeping upon arrival to room, per pt's significant other pt had a BM recently and also received medication that made him tired. Pt agreeable to PT session, wants to focus on LE AAROM given LE spasms L>R. Pt tolerated well, pt reaching point where lacking approximately 15 deg knee extension and 10 degrees hip flexion on L side. Pt repositioned for pressure relief with towel rolls and pillows. Pt and family ready for d/c back to rehab.       If plan is discharge home, recommend the following: Two people to help with walking and/or transfers;Two people to help with bathing/dressing/bathroom;Assistance with cooking/housework;Assistance with feeding;Direct supervision/assist for medications management;Direct supervision/assist for financial management;Assist for transportation;Help with stairs or ramp for entrance   Can travel by private vehicle        Equipment Recommendations  Other (comment) (defer)    Recommendations for Other Services       Precautions / Restrictions Precautions Precautions: Fall;Cervical Precaution Booklet Issued: Yes (comment) Precaution Comments: hx of many falls, severe B LE hypertonia with muscle spasms L>R Required Braces or Orthoses: Cervical Brace Cervical Brace: Hard collar;Other (comment) Restrictions Weight Bearing Restrictions Per Provider Order: No Other Position/Activity Restrictions: per [name] PA, Pt can wear cervical brace PRN as on 1/20     Mobility  Bed Mobility Overal bed mobility: Needs Assistance             General bed mobility  comments: pt declines EOB, requests LE exercise and ROM for spasms    Transfers                        Ambulation/Gait                   Stairs             Wheelchair Mobility     Tilt Bed    Modified Rankin (Stroke Patients Only)       Balance Overall balance assessment: Needs assistance, History of Falls Sitting-balance support: Single extremity supported, Bilateral upper extremity supported, No upper extremity supported, Feet supported Sitting balance-Leahy Scale: Poor   Postural control: Posterior lean, Right lateral lean                                  Cognition Arousal: Lethargic Behavior During Therapy: Flat affect Overall Cognitive Status: Impaired/Different from baseline Area of Impairment: Following commands, Problem solving                       Following Commands: Follows one step commands with increased time     Problem Solving: Slow processing General Comments: pt drowsy this date, just had BM x2 and received anxiety medication per pt's significant other        Exercises Other Exercises Other Exercises: LE ROM: hip and knee extension PROM (trial of AAROM by having pt perform quad set to inhibit hamstrings), requiring slow extension against max resistance given tone espeically on L; hip abduction PROM bilat; AAROM DF/PF  General Comments General comments (skin integrity, edema, etc.): HR 120s throughout      Pertinent Vitals/Pain Pain Assessment Pain Assessment: Faces Faces Pain Scale: Hurts even more Pain Location: LE spasms Pain Descriptors / Indicators: Spasm, Grimacing, Guarding, Moaning Pain Intervention(s): Limited activity within patient's tolerance, Monitored during session, Repositioned    Home Living                          Prior Function            PT Goals (current goals can now be found in the care plan section) Acute Rehab PT Goals Patient Stated Goal: Return to  rehab PT Goal Formulation: With patient/family Time For Goal Achievement: 07/28/23 Potential to Achieve Goals: Fair Progress towards PT goals: Progressing toward goals    Frequency    Min 1X/week      PT Plan      Co-evaluation              AM-PAC PT "6 Clicks" Mobility   Outcome Measure  Help needed turning from your back to your side while in a flat bed without using bedrails?: Total Help needed moving from lying on your back to sitting on the side of a flat bed without using bedrails?: Total Help needed moving to and from a bed to a chair (including a wheelchair)?: Total Help needed standing up from a chair using your arms (e.g., wheelchair or bedside chair)?: Total Help needed to walk in hospital room?: Total Help needed climbing 3-5 steps with a railing? : Total 6 Click Score: 6    End of Session   Activity Tolerance: Patient limited by pain Patient left: in bed;with call bell/phone within reach;with bed alarm set;with family/visitor present Nurse Communication: Mobility status PT Visit Diagnosis: Other abnormalities of gait and mobility (R26.89);Muscle weakness (generalized) (M62.81)     Time: 8295-6213 PT Time Calculation (min) (ACUTE ONLY): 17 min  Charges:    $Therapeutic Exercise: 8-22 mins PT General Charges $$ ACUTE PT VISIT: 1 Visit                     Marye Round, PT DPT Acute Rehabilitation Services Secure Chat Preferred  Office [phone]    Regis Wiland Sheliah Plane 07/16/2023, 3:16 PM

## 2023-07-17 ENCOUNTER — Other Ambulatory Visit: Payer: Self-pay

## 2023-07-17 ENCOUNTER — Encounter (HOSPITAL_COMMUNITY): Payer: Self-pay | Admitting: Physical Medicine and Rehabilitation

## 2023-07-17 ENCOUNTER — Inpatient Hospital Stay (HOSPITAL_COMMUNITY)
Admission: AD | Admit: 2023-07-17 | Discharge: 2023-08-01 | DRG: 559 | Disposition: A | Discharge status: Home Health | Payer: Medicaid Other | Source: Intra-hospital | Attending: Physical Medicine and Rehabilitation | Admitting: Physical Medicine and Rehabilitation

## 2023-07-17 DIAGNOSIS — G959 Disease of spinal cord, unspecified: Principal | ICD-10-CM | POA: Diagnosis present

## 2023-07-17 DIAGNOSIS — G8254 Quadriplegia, C5-C7 incomplete: Secondary | ICD-10-CM | POA: Diagnosis present

## 2023-07-17 DIAGNOSIS — Z7901 Long term (current) use of anticoagulants: Secondary | ICD-10-CM | POA: Diagnosis not present

## 2023-07-17 DIAGNOSIS — F17290 Nicotine dependence, other tobacco product, uncomplicated: Secondary | ICD-10-CM | POA: Diagnosis present

## 2023-07-17 DIAGNOSIS — N390 Urinary tract infection, site not specified: Secondary | ICD-10-CM | POA: Diagnosis present

## 2023-07-17 DIAGNOSIS — K592 Neurogenic bowel, not elsewhere classified: Secondary | ICD-10-CM | POA: Diagnosis present

## 2023-07-17 DIAGNOSIS — R252 Cramp and spasm: Secondary | ICD-10-CM | POA: Diagnosis present

## 2023-07-17 DIAGNOSIS — Z981 Arthrodesis status: Secondary | ICD-10-CM | POA: Diagnosis not present

## 2023-07-17 DIAGNOSIS — F17210 Nicotine dependence, cigarettes, uncomplicated: Secondary | ICD-10-CM | POA: Diagnosis present

## 2023-07-17 DIAGNOSIS — I951 Orthostatic hypotension: Secondary | ICD-10-CM | POA: Diagnosis present

## 2023-07-17 DIAGNOSIS — K5900 Constipation, unspecified: Secondary | ICD-10-CM | POA: Diagnosis present

## 2023-07-17 DIAGNOSIS — G8220 Paraplegia, unspecified: Principal | ICD-10-CM | POA: Diagnosis present

## 2023-07-17 DIAGNOSIS — N319 Neuromuscular dysfunction of bladder, unspecified: Secondary | ICD-10-CM | POA: Diagnosis present

## 2023-07-17 DIAGNOSIS — B961 Klebsiella pneumoniae [K. pneumoniae] as the cause of diseases classified elsewhere: Secondary | ICD-10-CM | POA: Diagnosis present

## 2023-07-17 DIAGNOSIS — G4700 Insomnia, unspecified: Secondary | ICD-10-CM

## 2023-07-17 DIAGNOSIS — Z79899 Other long term (current) drug therapy: Secondary | ICD-10-CM

## 2023-07-17 DIAGNOSIS — Z4789 Encounter for other orthopedic aftercare: Secondary | ICD-10-CM | POA: Diagnosis present

## 2023-07-17 DIAGNOSIS — R7881 Bacteremia: Secondary | ICD-10-CM | POA: Diagnosis not present

## 2023-07-17 DIAGNOSIS — R338 Other retention of urine: Secondary | ICD-10-CM | POA: Diagnosis present

## 2023-07-17 HISTORY — DX: Calculus of kidney: N20.0

## 2023-07-17 MED ORDER — SIMETHICONE 80 MG PO CHEW
80.0000 mg | CHEWABLE_TABLET | Freq: Four times a day (QID) | ORAL | Status: DC
  Administered 2023-07-17 – 2023-08-01 (×55): 80 mg via ORAL
  Filled 2023-07-17 (×57): qty 1

## 2023-07-17 MED ORDER — QUETIAPINE FUMARATE 25 MG PO TABS
25.0000 mg | ORAL_TABLET | Freq: Every day | ORAL | Status: DC
  Administered 2023-07-17 – 2023-07-18 (×2): 25 mg via ORAL
  Administered 2023-07-19: 50 mg via ORAL
  Administered 2023-07-20: 25 mg via ORAL
  Administered 2023-07-21: 50 mg via ORAL
  Administered 2023-07-22 – 2023-07-24 (×3): 25 mg via ORAL
  Filled 2023-07-17 (×4): qty 1
  Filled 2023-07-17 (×2): qty 2
  Filled 2023-07-17 (×2): qty 1

## 2023-07-17 MED ORDER — POLYETHYLENE GLYCOL 3350 17 G PO PACK: 17.0000 g | PACK | Freq: Every day | ORAL | Status: DC | PRN

## 2023-07-17 MED ORDER — SORBITOL 70 % SOLN
60.0000 mL | Freq: Once | Status: AC
  Administered 2023-07-18: 60 mL via ORAL
  Filled 2023-07-17: qty 60

## 2023-07-17 MED ORDER — MORPHINE SULFATE ER 15 MG PO TBCR
15.0000 mg | EXTENDED_RELEASE_TABLET | Freq: Every day | ORAL | Status: DC
  Administered 2023-07-17 – 2023-07-30 (×14): 15 mg via ORAL
  Filled 2023-07-17 (×14): qty 1

## 2023-07-17 MED ORDER — SENNOSIDES-DOCUSATE SODIUM 8.6-50 MG PO TABS
2.0000 | ORAL_TABLET | Freq: Every day | ORAL | Status: DC
  Administered 2023-07-18 – 2023-07-20 (×3): 2 via ORAL
  Filled 2023-07-17 (×3): qty 2

## 2023-07-17 MED ORDER — SODIUM CHLORIDE 0.9% FLUSH
3.0000 mL | Freq: Two times a day (BID) | INTRAVENOUS | Status: DC
  Administered 2023-07-17: 3 mL via INTRAVENOUS

## 2023-07-17 MED ORDER — ENOXAPARIN SODIUM 40 MG/0.4ML IJ SOSY
40.0000 mg | PREFILLED_SYRINGE | INTRAMUSCULAR | Status: DC
  Administered 2023-07-17 – 2023-07-31 (×15): 40 mg via SUBCUTANEOUS
  Filled 2023-07-17 (×15): qty 0.4

## 2023-07-17 MED ORDER — DULOXETINE HCL 30 MG PO CPEP
30.0000 mg | ORAL_CAPSULE | Freq: Every day | ORAL | Status: DC
  Administered 2023-07-18 – 2023-08-01 (×15): 30 mg via ORAL
  Filled 2023-07-17 (×15): qty 1

## 2023-07-17 MED ORDER — TAMSULOSIN HCL 0.4 MG PO CAPS
0.4000 mg | ORAL_CAPSULE | Freq: Every day | ORAL | Status: DC
Start: 2023-07-17 — End: 2023-08-01
  Administered 2023-07-17 – 2023-07-31 (×15): 0.4 mg via ORAL
  Filled 2023-07-17 (×15): qty 1

## 2023-07-17 MED ORDER — PROSOURCE PLUS PO LIQD
30.0000 mL | Freq: Two times a day (BID) | ORAL | Status: DC
  Administered 2023-07-18 – 2023-08-01 (×25): 30 mL via ORAL
  Filled 2023-07-17 (×24): qty 30

## 2023-07-17 MED ORDER — LIDOCAINE HCL URETHRAL/MUCOSAL 2 % EX GEL
CUTANEOUS | Status: DC | PRN
  Filled 2023-07-17 (×2): qty 6

## 2023-07-17 MED ORDER — BACLOFEN 10 MG PO TABS
20.0000 mg | ORAL_TABLET | Freq: Four times a day (QID) | ORAL | Status: DC
  Administered 2023-07-17 – 2023-07-19 (×7): 20 mg via ORAL
  Filled 2023-07-17 (×7): qty 2

## 2023-07-17 MED ORDER — DOCUSATE SODIUM 283 MG RE ENEM
1.0000 | ENEMA | Freq: Every day | RECTAL | Status: DC
  Administered 2023-07-17 – 2023-07-28 (×11): 283 mg via RECTAL
  Filled 2023-07-17 (×12): qty 1

## 2023-07-17 MED ORDER — FLEET ENEMA RE ENEM: 1.0000 | ENEMA | Freq: Once | RECTAL | Status: DC | PRN

## 2023-07-17 MED ORDER — VITAMIN C 500 MG PO TABS
1000.0000 mg | ORAL_TABLET | Freq: Every day | ORAL | Status: DC
  Administered 2023-07-18 – 2023-08-01 (×15): 1000 mg via ORAL
  Filled 2023-07-17 (×15): qty 2

## 2023-07-17 MED ORDER — BISACODYL 10 MG RE SUPP
10.0000 mg | Freq: Every day | RECTAL | Status: DC
  Administered 2023-07-17: 10 mg via RECTAL
  Filled 2023-07-17: qty 1

## 2023-07-17 MED ORDER — LIDOCAINE HCL URETHRAL/MUCOSAL 2 % EX GEL: 1.0000 | Freq: Four times a day (QID) | CUTANEOUS | Status: DC | PRN

## 2023-07-17 MED ORDER — GUAIFENESIN ER 600 MG PO TB12
600.0000 mg | ORAL_TABLET | Freq: Two times a day (BID) | ORAL | Status: DC
  Administered 2023-07-17 – 2023-08-01 (×30): 600 mg via ORAL
  Filled 2023-07-17 (×30): qty 1

## 2023-07-17 MED ORDER — PROMETHAZINE HCL 12.5 MG RE SUPP
12.5000 mg | Freq: Four times a day (QID) | RECTAL | Status: DC | PRN
  Filled 2023-07-17: qty 1

## 2023-07-17 MED ORDER — ACETAMINOPHEN 325 MG PO TABS: 325.0000 mg | ORAL_TABLET | ORAL | Status: DC | PRN

## 2023-07-17 MED ORDER — OXYCODONE HCL 5 MG PO TABS
5.0000 mg | ORAL_TABLET | ORAL | Status: DC | PRN
  Administered 2023-07-18 – 2023-08-01 (×9): 5 mg via ORAL
  Filled 2023-07-17 (×9): qty 1

## 2023-07-17 MED ORDER — SODIUM CHLORIDE 0.9% FLUSH: 3.0000 mL | INTRAVENOUS | Status: DC | PRN

## 2023-07-17 MED ORDER — MENTHOL 3 MG MT LOZG
1.0000 | LOZENGE | OROMUCOSAL | Status: DC | PRN
  Filled 2023-07-17: qty 9

## 2023-07-17 MED ORDER — VITAMIN C 500 MG PO TABS
1000.0000 mg | ORAL_TABLET | Freq: Every day | ORAL | Status: DC
  Administered 2023-07-17: 1000 mg via ORAL
  Filled 2023-07-17: qty 2

## 2023-07-17 MED ORDER — POLYETHYLENE GLYCOL 3350 17 G PO PACK
17.0000 g | PACK | Freq: Every day | ORAL | Status: DC
  Administered 2023-07-18 – 2023-08-01 (×14): 17 g via ORAL
  Filled 2023-07-17 (×15): qty 1

## 2023-07-17 MED ORDER — TIZANIDINE HCL 4 MG PO TABS
4.0000 mg | ORAL_TABLET | Freq: Four times a day (QID) | ORAL | Status: DC | PRN
  Administered 2023-07-18 – 2023-07-27 (×12): 4 mg via ORAL
  Filled 2023-07-17 (×12): qty 1

## 2023-07-17 MED ORDER — SORBITOL 70 % SOLN
30.0000 mL | Freq: Every day | Status: DC | PRN
  Administered 2023-08-01: 30 mL via ORAL
  Filled 2023-07-17: qty 30

## 2023-07-17 MED ORDER — CHLORHEXIDINE GLUCONATE CLOTH 2 % EX PADS: 6.0000 | MEDICATED_PAD | Freq: Every day | CUTANEOUS | Status: DC

## 2023-07-17 MED ORDER — PROMETHAZINE HCL 25 MG/ML IJ SOLN
12.5000 mg | Freq: Four times a day (QID) | INTRAMUSCULAR | Status: DC | PRN
  Filled 2023-07-17: qty 1

## 2023-07-17 MED ORDER — HYDROCERIN EX CREA
TOPICAL_CREAM | Freq: Two times a day (BID) | CUTANEOUS | Status: DC
  Administered 2023-07-25: 1 via TOPICAL
  Filled 2023-07-17 (×2): qty 113

## 2023-07-17 MED ORDER — ACETAMINOPHEN 325 MG PO TABS
650.0000 mg | ORAL_TABLET | Freq: Three times a day (TID) | ORAL | Status: DC
  Administered 2023-07-17 – 2023-08-01 (×58): 650 mg via ORAL
  Filled 2023-07-17 (×59): qty 2

## 2023-07-17 MED ORDER — PROMETHAZINE HCL 12.5 MG PO TABS
12.5000 mg | ORAL_TABLET | Freq: Four times a day (QID) | ORAL | Status: DC | PRN
  Filled 2023-07-17: qty 1

## 2023-07-17 MED ORDER — GUAIFENESIN-DM 100-10 MG/5ML PO SYRP: 10.0000 mL | ORAL_SOLUTION | Freq: Four times a day (QID) | ORAL | Status: DC | PRN

## 2023-07-17 MED ORDER — FAMOTIDINE 20 MG PO TABS: 10.0000 mg | ORAL_TABLET | Freq: Every day | ORAL | Status: DC | PRN

## 2023-07-17 MED ORDER — LIDOCAINE 5 % EX PTCH
1.0000 | MEDICATED_PATCH | CUTANEOUS | Status: DC
  Administered 2023-07-18 – 2023-08-01 (×14): 1 via TRANSDERMAL
  Filled 2023-07-17 (×17): qty 1

## 2023-07-17 MED ORDER — DIAZEPAM 5 MG PO TABS
5.0000 mg | ORAL_TABLET | Freq: Four times a day (QID) | ORAL | Status: DC
  Administered 2023-07-17 – 2023-08-01 (×59): 5 mg via ORAL
  Filled 2023-07-17 (×59): qty 1

## 2023-07-17 NOTE — Progress Notes (Signed)
Inpatient Rehab Admissions Coordinator:   I have a CIR bed for this pt. Today. RN may call report to [phone]  Pt. In agreement to admit to CIR for estimated 16-18 days with the goal of discharging home with assist of his wife.   Megan Salon, MS, CCC-SLP Rehab Admissions Coordinator  [phone] (celll) [phone] (office)

## 2023-07-17 NOTE — Plan of Care (Signed)
  Problem: Consults Goal: RH SPINAL CORD INJURY PATIENT EDUCATION Description:  See Patient Education module for education specifics.  07/17/2023 1903 by Elgie Congo, LPN Outcome: Progressing 07/17/2023 1903 by Elgie Congo, LPN Outcome: Progressing   Problem: SCI BOWEL ELIMINATION Goal: RH STG MANAGE BOWEL WITH ASSISTANCE Description: STG Manage Bowel with medication with min Assistance. 07/17/2023 1903 by Elgie Congo, LPN Outcome: Progressing 07/17/2023 1903 by Elgie Congo, LPN Outcome: Progressing Goal: RH STG SCI MANAGE BOWEL WITH MEDICATION WITH ASSISTANCE Description: STG SCI Manage bowel with medication with assistance. 07/17/2023 1903 by Elgie Congo, LPN Outcome: Progressing 07/17/2023 1903 by Elgie Congo, LPN Outcome: Progressing   Problem: SCI BLADDER ELIMINATION Goal: RH STG MANAGE BLADDER WITH ASSISTANCE Description: STG Manage Bladder medications with minimal Assistance 07/17/2023 1903 by Elgie Congo, LPN Outcome: Progressing 07/17/2023 1903 by Elgie Congo, LPN Outcome: Progressing Goal: RH STG MANAGE BLADDER WITH MEDICATION WITH ASSISTANCE Description: STG Manage Bladder With Medication With Assistance. 07/17/2023 1903 by Elgie Congo, LPN Outcome: Progressing 07/17/2023 1903 by Elgie Congo, LPN Outcome: Progressing   Problem: RH SAFETY Goal: RH STG ADHERE TO SAFETY PRECAUTIONS W/ASSISTANCE/DEVICE Description: STG Adhere to Safety Precautions With min Assistance/Device. 07/17/2023 1903 by Elgie Congo, LPN Outcome: Progressing 07/17/2023 1903 by Elgie Congo, LPN Outcome: Progressing   Problem: RH PAIN MANAGEMENT Goal: RH STG PAIN MANAGED AT OR BELOW PT'S PAIN GOAL Description: <4 with prns 07/17/2023 1903 by Elgie Congo, LPN Outcome: Progressing 07/17/2023 1903 by Elgie Congo, LPN Outcome: Progressing   Problem: RH KNOWLEDGE DEFICIT SCI Goal: RH STG INCREASE KNOWLEDGE OF SELF CARE AFTER SCI Outcome:  Progressing

## 2023-07-17 NOTE — Plan of Care (Signed)
  Problem: Education: Goal: Ability to verbalize activity precautions or restrictions will improve Outcome: Progressing Goal: Knowledge of the prescribed therapeutic regimen will improve Outcome: Progressing Goal: Understanding of discharge needs will improve Outcome: Progressing   Problem: Activity: Goal: Ability to avoid complications of mobility impairment will improve Outcome: Progressing Goal: Ability to tolerate increased activity will improve Outcome: Progressing Goal: Will remain free from falls Outcome: Progressing   Problem: Bowel/Gastric: Goal: Gastrointestinal status for postoperative course will improve Outcome: Progressing

## 2023-07-17 NOTE — Plan of Care (Signed)
  Problem: Consults Goal: RH SPINAL CORD INJURY PATIENT EDUCATION Description:  See Patient Education module for education specifics.  Outcome: Progressing   Problem: SCI BOWEL ELIMINATION Goal: RH STG MANAGE BOWEL WITH ASSISTANCE Description: STG Manage Bowel with medication with min Assistance. Outcome: Progressing Goal: RH STG SCI MANAGE BOWEL WITH MEDICATION WITH ASSISTANCE Description: STG SCI Manage bowel with medication with assistance. Outcome: Progressing   Problem: SCI BLADDER ELIMINATION Goal: RH STG MANAGE BLADDER WITH ASSISTANCE Description: STG Manage Bladder medications with minimal Assistance Outcome: Progressing Goal: RH STG MANAGE BLADDER WITH MEDICATION WITH ASSISTANCE Description: STG Manage Bladder With Medication With Assistance. Outcome: Progressing   Problem: RH SAFETY Goal: RH STG ADHERE TO SAFETY PRECAUTIONS W/ASSISTANCE/DEVICE Description: STG Adhere to Safety Precautions With min Assistance/Device. Outcome: Progressing   Problem: RH PAIN MANAGEMENT Goal: RH STG PAIN MANAGED AT OR BELOW PT'S PAIN GOAL Description: <4 with prns Outcome: Progressing

## 2023-07-17 NOTE — Plan of Care (Signed)
  Problem: Consults Goal: RH SPINAL CORD INJURY PATIENT EDUCATION Description:  See Patient Education module for education specifics.  07/17/2023 1903 by Elgie Congo, LPN Outcome: Progressing 07/17/2023 1903 by Elgie Congo, LPN Outcome: Progressing 07/17/2023 1903 by Elgie Congo, LPN Outcome: Progressing   Problem: SCI BOWEL ELIMINATION Goal: RH STG MANAGE BOWEL WITH ASSISTANCE Description: STG Manage Bowel with medication with min Assistance. 07/17/2023 1903 by Elgie Congo, LPN Outcome: Progressing 07/17/2023 1903 by Elgie Congo, LPN Outcome: Progressing 07/17/2023 1903 by Elgie Congo, LPN Outcome: Progressing Goal: RH STG SCI MANAGE BOWEL WITH MEDICATION WITH ASSISTANCE Description: STG SCI Manage bowel with medication with assistance. 07/17/2023 1903 by Elgie Congo, LPN Outcome: Progressing 07/17/2023 1903 by Elgie Congo, LPN Outcome: Progressing 07/17/2023 1903 by Elgie Congo, LPN Outcome: Progressing   Problem: SCI BLADDER ELIMINATION Goal: RH STG MANAGE BLADDER WITH ASSISTANCE Description: STG Manage Bladder medications with minimal Assistance 07/17/2023 1903 by Elgie Congo, LPN Outcome: Progressing 07/17/2023 1903 by Elgie Congo, LPN Outcome: Progressing 07/17/2023 1903 by Elgie Congo, LPN Outcome: Progressing Goal: RH STG MANAGE BLADDER WITH MEDICATION WITH ASSISTANCE Description: STG Manage Bladder With Medication With Assistance. 07/17/2023 1903 by Elgie Congo, LPN Outcome: Progressing 07/17/2023 1903 by Elgie Congo, LPN Outcome: Progressing 07/17/2023 1903 by Elgie Congo, LPN Outcome: Progressing   Problem: RH SAFETY Goal: RH STG ADHERE TO SAFETY PRECAUTIONS W/ASSISTANCE/DEVICE Description: STG Adhere to Safety Precautions With min Assistance/Device. 07/17/2023 1903 by Elgie Congo, LPN Outcome: Progressing 07/17/2023 1903 by Elgie Congo, LPN Outcome: Progressing 07/17/2023 1903 by Elgie Congo,  LPN Outcome: Progressing   Problem: RH PAIN MANAGEMENT Goal: RH STG PAIN MANAGED AT OR BELOW PT'S PAIN GOAL Description: <4 with prns 07/17/2023 1903 by Elgie Congo, LPN Outcome: Progressing 07/17/2023 1903 by Elgie Congo, LPN Outcome: Progressing 07/17/2023 1903 by Elgie Congo, LPN Outcome: Progressing   Problem: RH KNOWLEDGE DEFICIT SCI Goal: RH STG INCREASE KNOWLEDGE OF SELF CARE AFTER SCI 07/17/2023 1903 by Elgie Congo, LPN Outcome: Progressing 07/17/2023 1903 by Elgie Congo, LPN Outcome: Progressing

## 2023-07-17 NOTE — TOC Transition Note (Signed)
Transition of Care (TOC) - Discharge Note Donn Pierini RN,BSN Transitions of Care Unit 4NP (Non Trauma)- RN Case Manager See Treatment Team for direct Phone #   Patient Details  Name: Leon Rodriguez MRN: [id number] Date of Birth: 11-28-1965  Transition of Care [facility]) CM/SW Contact:  Darrold Span, RN Phone Number: 07/17/2023, 11:19 AM   Clinical Narrative:    CM notified by CIR liaison that pt has insurance auth for re-admit to CIR and bed available today.  Pt has been cleared by attending to return to [facility] INPT rehab.   Pt will transition back to CIR later today, No further TOC needs noted.    Final next level of care: IP Rehab Facility Barriers to Discharge: No Barriers Identified   Patient Goals and CMS Choice Patient states their goals for this hospitalization and ongoing recovery are:: return to INPT rehab CMS Medicare.gov Compare Post Acute Care list provided to:: Patient Choice offered to / list presented to : Patient      Discharge Placement               [facility] INPT rehab        Discharge Plan and Services Additional resources added to the After Visit Summary for     Discharge Planning Services: CM Consult Post Acute Care Choice: IP Rehab          DME Arranged: N/A DME Agency: NA         HH Agency: NA        Social Drivers of Health (SDOH) Interventions SDOH Screenings   Food Insecurity: Unknown (07/14/2023)  Housing: Unknown (07/14/2023)  Transportation Needs: No Transportation Needs (07/14/2023)  Utilities: Patient Declined (07/14/2023)  Depression (PHQ2-9): High Risk (04/05/2023)  Tobacco Use: High Risk (07/12/2023)     Readmission Risk Interventions    07/17/2023   11:19 AM 06/07/2023   12:29 PM  Readmission Risk Prevention Plan  Medication Screening  Complete  Transportation Screening Complete Complete  HRI or Home Care Consult Complete   Social Work Consult for Recovery Care Planning/Counseling Complete   Palliative Care  Screening Not Applicable   Medication Review Oceanographer) Complete

## 2023-07-17 NOTE — Progress Notes (Signed)
Inpatient Rehabilitation Admission Medication Review by a Pharmacist  A complete drug regimen review was completed for this patient to identify any potential clinically significant medication issues.  High Risk Drug Classes Is patient taking? Indication by Medication  Antipsychotic No   Anticoagulant Yes Enoxaparin - VTE prophylaxis  Antibiotic No   Opioid Yes MS Contin w/ supper and PRN Oxycodone - pain  Antiplatelet No   Hypoglycemics/insulin No   Vasoactive Medication Yes Tamsulosin - neurogenic bladder  Chemotherapy No   Other Yes Baclofen, diazepam - muscle relaxers Guaifenesin - mucolytic Lidocaine patch - topical pain relief Vitamin C - supplement Miralax, senna-docusate, Bisacodyl PR - laxatives  PRNs: Acetaminophen - mild pain Famotidine - heartburn, indigestion Guaifenesin/dextromethorphan - cough Lidocaine jelly - pain relief with I&O caths Cepacol lozenges - sore throat Miralax, sorbitol, Fleets enema - constipation Promethazine - nausea Tizanidine - muscle spasms     Type of Medication Issue Identified Description of Issue Recommendation(s)  Drug Interaction(s) (clinically significant)     Duplicate Therapy     Allergy     No Medication Administration End Date     Incorrect Dose     Additional Drug Therapy Needed     Significant med changes from prior encounter (inform family/care partners about these prior to discharge). Medications from inpatient admission continued. Acetaminophen changed from scheduled to PRN.  Was taking tamsulosin 0.8 mg after supper prior to transfer 07/12/23 >  changed to 0.4 mg during inpatient admit.  Prior to inpatient transfer 07/12/23, he was also taking Dantrolene 25 mg BID, Duloxetine 60 mg daily, Quetiapine 75 mg hs, and Simethicone 160 mg QID.  Not on while inpatient.  New Vitamin C on CIR. Communicate changes with patient/family prior to discharge.   Monitor for any need to adjust dosage and/or add back any of prior  medications.      Other        Clinically significant medication issues were identified that warrant physician communication and completion of prescribed/recommended actions by midnight of the next day:  No  Name of provider notified for urgent issues identified:   Provider Method of Notification:   Pharmacist comments:  - Transferred from CIR to inpatient on 07/12/23 for cervical disc surgery - Was on Acetaminophen 650 mg q6hr scheduled prior to transfer back to CIR > now PRN. - SQ heparin for VTE prophylaxis begun 07/15/23 per Neurosurgery > changing to Enoxaparin 40 mg SQ Q24hrs on CIR.  Time spent performing this drug regimen review (minutes):  [address]   Nicolette Bang [place], [place] 07/17/2023 5:03 PM

## 2023-07-17 NOTE — Progress Notes (Signed)
Occupational Therapy Treatment Patient Details Name: Leon Rodriguez MRN: [id number] DOB: 02/15/1966 Today's Date: 07/17/2023   History of present illness 58 y.o. male presenting from CIR 1/17 for posterior cervical decompression C6-T1 with instrumentation and fusion due to worsening BLE weakness and spasms. Pt with recent ACDF C6-T1 06/05/2023. PMH includes OA, hiatal hernia repair.   OT comments  Patient seen with PT to address bed mobility and sitting balance. Patient participated in BLE ROM and stretching prior to getting to EOB due to patient stated he legs need to be warmed up. Patient able to get to EOB with mod/max assist of 2 and required min to mod assist for sitting balance on EOB with bouts of CGA. Patient assisted back to supine and provided setup for breakfast. Patient will benefit from intensive inpatient follow up therapy, >3 hours/day for continued OT services to address bathing, dressing, and functional transfers.       If plan is discharge home, recommend the following:  Two people to help with walking and/or transfers;Two people to help with bathing/dressing/bathroom;Assistance with cooking/housework;Assist for transportation;Help with stairs or ramp for entrance   Equipment Recommendations  Other (comment) (defer)    Recommendations for Other Services Rehab consult    Precautions / Restrictions Precautions Precautions: Fall;Cervical Precaution Comments: hx of many falls, severe B LE hypertonia with muscle spasms L>R Required Braces or Orthoses: Cervical Brace Cervical Brace: Hard collar Restrictions Weight Bearing Restrictions Per Provider Order: No Other Position/Activity Restrictions: per [name] PA, Pt can wear cervical brace PRN as on 1/20       Mobility Bed Mobility Overal bed mobility: Needs Assistance Bed Mobility: Rolling, Sidelying to Sit, Sit to Sidelying Rolling: Mod assist Sidelying to sit: +2 for physical assistance, Mod assist, Max  assist   Sit to supine: Max assist, +2 for physical assistance   General bed mobility comments: cues for sequencing, assist with BLE and trunk    Transfers                         Balance Overall balance assessment: Needs assistance, History of Falls Sitting-balance support: Single extremity supported, Bilateral upper extremity supported, No upper extremity supported, Feet supported Sitting balance-Leahy Scale: Poor Sitting balance - Comments: Pt sat EOB x 5 minutes, mod to min guard assist. Postural control: Posterior lean, Left lateral lean                                 ADL either performed or assessed with clinical judgement   ADL Overall ADL's : Needs assistance/impaired     Grooming: Wash/dry face;Supervision/safety;Sitting Grooming Details (indicate cue type and reason): performed seated on EOB with assistance for sitting balance             Lower Body Dressing: Total assistance;Bed level Lower Body Dressing Details (indicate cue type and reason): for donning/doffing socks                    Extremity/Trunk Assessment              Vision       Perception     Praxis      Cognition Arousal: Alert Behavior During Therapy: Flat affect Overall Cognitive Status: Impaired/Different from baseline Area of Impairment: Following commands, Problem solving  Following Commands: Follows one step commands with increased time     Problem Solving: Slow processing          Exercises      Shoulder Instructions       General Comments HR in 120's during therapy    Pertinent Vitals/ Pain       Pain Assessment Pain Assessment: Faces Faces Pain Scale: Hurts even more Pain Location: LE spasms Pain Descriptors / Indicators: Spasm, Grimacing, Guarding, Moaning Pain Intervention(s): Monitored during session  Home Living                                          Prior  Functioning/Environment              Frequency  Min 1X/week        Progress Toward Goals  OT Goals(current goals can now be found in the care plan section)  Progress towards OT goals: Progressing toward goals  Acute Rehab OT Goals Patient Stated Goal: get better OT Goal Formulation: With patient/family Time For Goal Achievement: 07/28/23 Potential to Achieve Goals: Fair ADL Goals Pt Will Perform Grooming: sitting;with contact guard assist Pt Will Perform Upper Body Bathing: sitting;with min assist Pt Will Perform Lower Body Bathing: with min assist;with adaptive equipment;sit to/from stand Pt Will Perform Upper Body Dressing: sitting;with contact guard assist Pt Will Perform Lower Body Dressing: sit to/from stand;with min assist Pt Will Transfer to Toilet: with mod assist;with transfer board Pt Will Perform Toileting - Clothing Manipulation and hygiene: with contact guard assist;sit to/from stand Additional ADL Goal #1: Pt will perform bed mobility within cervical precautions with mod A in prep for ADL  Plan      Co-evaluation    PT/OT/SLP Co-Evaluation/Treatment: Yes Reason for Co-Treatment: Complexity of the patient's impairments (multi-system involvement);For patient/therapist safety;To address functional/ADL transfers PT goals addressed during session: Mobility/safety with mobility;Balance;Strengthening/ROM OT goals addressed during session: ADL's and self-care      AM-PAC OT "6 Clicks" Daily Activity     Outcome Measure   Help from another person eating meals?: None Help from another person taking care of personal grooming?: A Little Help from another person toileting, which includes using toliet, bedpan, or urinal?: Total Help from another person bathing (including washing, rinsing, drying)?: A Lot Help from another person to put on and taking off regular upper body clothing?: A Lot Help from another person to put on and taking off regular lower body  clothing?: Total 6 Click Score: 13    End of Session    OT Visit Diagnosis: Other abnormalities of gait and mobility (R26.89);History of falling (Z91.81);Muscle weakness (generalized) (M62.81);Repeated falls (R29.6);Pain Pain - Right/Left:  (BLE) Pain - part of body: Leg   Activity Tolerance Patient tolerated treatment well   Patient Left in bed;with call bell/phone within reach;with bed alarm set;with family/visitor present   Nurse Communication Mobility status        Time: 1610-9604 OT Time Calculation (min): 25 min  Charges: OT General Charges $OT Visit: 1 Visit OT Treatments $Self Care/Home Management : 8-22 mins  Alfonse Flavors, OTA Acute Rehabilitation Services  Office [phone]   Dewain Penning 07/17/2023, 11:22 AM

## 2023-07-17 NOTE — Progress Notes (Addendum)
PMR Admission Coordinator Pre-Admission Assessment   Patient: Leon Rodriguez is an 58 y.o., male MRN: [id number] DOB: 04/25/1966 Height:   Weight:     Insurance Information HMO:     PPO:      PCP:      IPA:      80/20:      OTHER: Group [id number] PRIMARY: Leon Rodriguez [facility] Medicaid      Policy#: [id number]      Subscriber: self CM Name: Leon Rodriguez      Phone#: [phone]  Fax#: [phone] Pre-Cert#:[id number] Approved 1/21 for 1/21-1/27     Employer: Currently on LOA Benefits:  Phone #: [phone]     Name: Verified 06/06/23 Eff. Date: 03/26/23     Deduct: $0      Out of Pocket Max: $0      Life Max: n/a  CIR: 100%      SNF: 100% Outpatient:       Co-Pay: $4 copay/visit Home Health: 100%      Co-Pay: none DME: 100%     Co-Pay: none Providers: in network  SECONDARY:       Policy#:      Phone#:    Artist:       Phone#:    The Data processing manager" for patients in Inpatient Rehabilitation Facilities with attached "Privacy Act Statement-Health Care Records" was provided and verbally reviewed with: N/A   Emergency Contact Information Contact Information       Name Relation Home Work Mobile    Leon Rodriguez Significant other     [phone]         Other Contacts   None on File        Current Medical History  Patient Admitting Diagnosis: Cervical Myelopathy History of Present Illness: A 58 y.o. male with a history of worsening bilateral lower extremity weakness and sensory loss progressing over several months.  He had progressed to the point where patient was requiring a wheelchair for mobility.  Patient developed spasticity as well.  MRI of the cervical spine revealed a C6-T1 severe cervical stenosis with cord compression and myelopathy.  Patient presented on 06/05/2023 for C6-T1 ACDF by Dr. Jake Samples.  Patient with significant lower extremity spasms this morning.  Valium was added and baclofen was increased.  He remains in a cervical collar. Pt  discharged to CIR  on 06/07/23 and transfered back to acute care on 06/13/23 due to tachycardia, fever, and suspected sepsis. Found to have bacteremia and acute cystitis. Pt With Klebsiella aerogenes, presumably from cystitis.  He completed a 7-day course of antibiotics on 06/19/2023. Pt. Admitted to CIR 06/17/23-07/12/23, but transferred off for posterior cervical decompression C6-T1 with instrumentation and fusion due to worsening BLE weakness and spasms. He was seen by PT/OT post operatively and they recommended return to CIR to assist return to PLOF.    Patient's medical record from [facility]  has been reviewed by the rehabilitation admission coordinator and physician.   Past Medical History      Past Medical History:  Diagnosis Date   Arthritis     Hip pain     Sepsis (HCC) 06/13/2023          Has the patient had major surgery during 100 days prior to admission? Yes   Family History   family history is not on file.   Current Medications  Current Medications    Current Facility-Administered Medications:    acetaminophen (TYLENOL) tablet 650 mg, 650 mg,  Oral, Q4H PRN **OR** acetaminophen (TYLENOL) suppository 650 mg, 650 mg, Rectal, Q4H PRN, [name] [place], PA-C   acetaminophen (TYLENOL) tablet 650 mg, 650 mg, Oral, Q6H, [name] [place], PA-C, 650 mg at 07/15/23 1255   alum & mag hydroxide-simeth (MAALOX/MYLANTA) 200-200-20 MG/5ML suspension 30 mL, 30 mL, Oral, Q4H PRN, [name] [place], PA-C, 30 mL at 07/13/23 0546   baclofen (LIORESAL) tablet 20 mg, 20 mg, Oral, Q6H, [name] [place], PA-C, 20 mg at 07/15/23 1255   bisacodyl (DULCOLAX) suppository 10 mg, 10 mg, Rectal, Daily PRN, [name] [place], PA-C, 10 mg at 07/13/23 0546   diazepam (VALIUM) tablet 5 mg, 5 mg, Oral, Q6H, [name] [place], PA-C, 5 mg at 07/15/23 1255   famotidine (PEPCID) tablet 10 mg, 10 mg, Oral, Daily PRN, [name] [place], PA-C, 10 mg at  07/13/23 0800   guaiFENesin (MUCINEX) 12 hr tablet 600 mg, 600 mg, Oral, BID, [name] [place], PA-C, 600 mg at 07/15/23 0981   guaiFENesin-dextromethorphan (ROBITUSSIN DM) 100-10 MG/5ML syrup 10 mL, 10 mL, Oral, Q6H PRN, [name] [place], PA-C   heparin injection 5,000 Units, 5,000 Units, Subcutaneous, Q8H, [name] [place], PA-C, 5,000 Units at 07/15/23 1256   lidocaine (LIDODERM) 5 % 1 patch, 1 patch, Transdermal, Q24H, [name] [place], PA-C, 1 patch at 07/15/23 0026   lidocaine (XYLOCAINE) 2 % jelly 1 Application, 1 Application, Urethral, Q6H PRN, Clovis Riley, PA-C   menthol-cetylpyridinium (CEPACOL) lozenge 3 mg, 1 lozenge, Oral, PRN, [name] [place], PA-C   morphine (MS CONTIN) 12 hr tablet 15 mg, 15 mg, Oral, Q supper, [name] [place], PA-C, 15 mg at 07/14/23 1708   ondansetron (ZOFRAN) tablet 4 mg, 4 mg, Oral, Q6H PRN **OR** ondansetron (ZOFRAN) injection 4 mg, 4 mg, Intravenous, Q6H PRN, [name] [place], PA-C   oxyCODONE (Oxy IR/ROXICODONE) immediate release tablet 5 mg, 5 mg, Oral, Q4H PRN, [name] [place], PA-C, 5 mg at 07/15/23 1914   polyethylene glycol (MIRALAX / GLYCOLAX) packet 17 g, 17 g, Oral, Daily, [name] [place], PA-C, 17 g at 07/15/23 7829   senna-docusate (Senokot-S) tablet 2 tablet, 2 tablet, Oral, QHS, [name] [place], PA-C, 2 tablet at 07/14/23 2127   sodium chloride flush (NS) 0.9 % injection 3 mL, 3 mL, Intravenous, Q12H, [name] [place], PA-C, 3 mL at 07/15/23 0947   sodium chloride flush (NS) 0.9 % injection 3 mL, 3 mL, Intravenous, PRN, [name] [place], PA-C   tamsulosin [facility]) capsule 0.4 mg, 0.4 mg, Oral, QPC supper, [name] [place], PA-C, 0.4 mg at 07/14/23 1707   tiZANidine (ZANAFLEX) tablet 4 mg, 4 mg, Oral, Q6H PRN, Clovis Riley, PA-C, 4 mg at 07/14/23 2131     Patients Current Diet:  Diet Order                  Diet regular Room service  appropriate? Yes; Fluid consistency: Thin  Diet effective now                         Precautions / Restrictions Precautions Precautions: Fall, Cervical Precaution Booklet Issued: Yes (comment) Precaution Comments: hx of many falls, severe B LE hypertonia with muscle spasms, verbally went through cervical precautions throughout session Cervical Brace: Hard collar, Other (comment) (per chart, "maintain aspen collar" was most recent order, however, pt without Aspen donned in session with wife report MD saying pt does not have to wear anymore; asked wife to go to  car and retrieve and donned for session as neuro team not available) Restrictions Weight Bearing Restrictions Per Provider Order: No Other Position/Activity Restrictions: cervical precautions    Has the patient had 2 or more falls or a fall with injury in the past year? Yes   Prior Activity Level Household: pt. went out for appointments   Prior Functional Level Self Care: Did the patient need help bathing, dressing, using the toilet or eating? Needed some help   Indoor Mobility: Did the patient need assistance with walking from room to room (with or without device)? Needed some help   Stairs: Did the patient need assistance with internal or external stairs (with or without device)? Needed some help   Functional Cognition: Did the patient need help planning regular tasks such as shopping or remembering to take medications? Independent   Patient Information Are you of Hispanic, Latino/a,or Spanish origin?: A. No, not of Hispanic, Latino/a, or Spanish origin What is your race?: B. Black or African American Do you need or want an interpreter to communicate with a doctor or health care staff?: 0. No   Patient's Response To:  Health Literacy and Transportation Is the patient able to respond to health literacy and transportation needs?: Yes Health Literacy - How often do you need to have someone help you when you read  instructions, pamphlets, or other written material from your doctor or pharmacy?: Never In the past 12 months, has lack of transportation kept you from medical appointments or from getting medications?: No In the past 12 months, has lack of transportation kept you from meetings, work, or from getting things needed for daily living?: No   Journalist, newspaper / Equipment Home Equipment: Information systems manager, Higher education careers adviser held shower head, Agricultural consultant (2 wheels), [employer] - single point, Wheelchair - manual, Crutches, Toilet riser   Prior Device Use: Indicate devices/aids used by the patient prior to current illness, exacerbation or injury? Manual wheelchair   Current Functional Level Cognition   Overall Cognitive Status: Impaired/Different from baseline Orientation Level: Oriented X4 Following Commands: Follows one step commands with increased time General Comments: pt answering questions with increased time, not formally assessed. Pt with some mix ups when asked to perform ROM or strength testing on LE, needing increased time and multimodal cues to [facility] as well as during mobility    Extremity Assessment (includes Sensation/Coordination)   Upper Extremity Assessment: Right hand dominant, RUE deficits/detail RUE Deficits / Details: pt reports tingling on dorsum of hand. ROM WFL. Did not formally assess strength secondary to cervical precautions but able to use BUE during lateral scooting. RUE with slightly decreased coordination noted as compared to L  Lower Extremity Assessment: Defer to PT evaluation RLE Deficits / Details: significant extensor tone, able to demo minor movement at toes, ankle, very small ROM knee flexion. unable to flex knee even once sitting EOB, reports sensation intact RLE Sensation: WNL RLE Coordination: decreased fine motor, decreased gross motor LLE Deficits / Details: pt reports sensation intact, able to demo small movements at toes, ankle, partial ROM at knee and hip but  limited by spasms. incrased adductor tone LLE: Unable to fully assess due to pain LLE Sensation: WNL LLE Coordination: decreased fine motor, decreased gross motor     ADLs   Overall ADL's : Needs assistance/impaired Eating/Feeding: Modified independent, Bed level Grooming: Bed level, Set up Upper Body Bathing: Moderate assistance, Sitting Lower Body Bathing: Total assistance, +2 for physical assistance, +2 for safety/equipment, Bed level Upper Body Dressing :  Moderate assistance, Sitting Lower Body Dressing: Total assistance, Bed level     Mobility   Overal bed mobility: Needs Assistance Bed Mobility: Rolling, Sidelying to Sit, Sit to Sidelying Rolling: Mod assist, Used rails Sidelying to sit: Max assist, HOB elevated, Used rails, +2 for physical assistance Sit to sidelying: Max assist, +2 for physical assistance General bed mobility comments: maxA to complete transition to sitting EOB, then modA to maintain sitting balance. leaning to R and posterior. unable to flex R knee to place on ground, unable to coordinate placement of LE or seated balance     Transfers   Overall transfer level: Needs assistance Equipment used: None Transfers: Bed to chair/wheelchair/BSC Bed to/from chair/wheelchair/BSC transfer type:: Lateral/scoot transfer  Lateral/Scoot Transfers: Mod assist, +2 physical assistance General transfer comment: lateral scoot transfer along EOB with modA of 2 to bed pad. pt with good ability to assist with UE, needing assist to reposition BLE between scoots     Ambulation / Gait / Stairs / Wheelchair Mobility   Ambulation/Gait General Gait Details: unable at this time.     Posture / Balance Dynamic Sitting Balance Sitting balance - Comments: modA to maintain sitting balance, pt with R lateral lean and posterior lean, unable to maintain position once assisted to midline Balance Overall balance assessment: Needs assistance, History of Falls Sitting-balance support: Single  extremity supported, Bilateral upper extremity supported, No upper extremity supported, Feet supported Sitting balance-Leahy Scale: Poor Sitting balance - Comments: modA to maintain sitting balance, pt with R lateral lean and posterior lean, unable to maintain position once assisted to midline Postural control: Posterior lean, Right lateral lean     Special needs/care consideration Skin   and Special service needs      Previous Home Environment (from acute therapy documentation) Living Arrangements: Spouse/significant other  Lives With: Spouse Available Help at Discharge: Family, Available 24 hours/day Type of Home: House Home Layout: Two level, Bed/bath upstairs Alternate Level Stairs-Number of Steps: flight Home Access: Stairs to enter Entrance Stairs-Rails: None Entrance Stairs-Number of Steps: 1 Bathroom Shower/Tub: Engineer, manufacturing systems: Standard Bathroom Accessibility: Yes Home Care Services: No Additional Comments: reports started using w/c in June 2024 - he uses the wheelchair downstairs for more mobility, goes downstairs 1x/day to smoke a cigar (but hasn't been downstairs in ~16month PTA), uses RW upstairs for very short distance gait to/from bathroom for BM otherwise uses urinal for bladder -- S.O. 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 position   Discharge Living Setting Plans for Discharge Living Setting: Patient's home Type of Home at Discharge: House Discharge Home Layout: Two level, Bed/bath upstairs Alternate Level Stairs-Rails: Right Alternate Level Stairs-Number of Steps: flight Discharge Home Access: Stairs to enter Entrance Stairs-Rails: None Entrance Stairs-Number of Steps: 1 Discharge Bathroom Shower/Tub: Tub/shower unit Discharge Bathroom Toilet: Standard Discharge Bathroom Accessibility: Yes How Accessible: Accessible via walker Does the patient have any  problems obtaining your medications?: No   Social/Family/Support Systems Patient Roles: Spouse Contact Information: [name] Anticipated Caregiver's Contact Information: see contacts Ability/Limitations of Caregiver: Wife to take FMLA through the end of February Caregiver Availability: 24/7 Discharge Plan Discussed with Primary Caregiver: Yes Is Caregiver In Agreement with Plan?: Yes Does Caregiver/Family have Issues with Lodging/Transportation while Pt is in Rehab?: Yes   Goals Patient/Family Goal for Rehab: Survision to min A Expected length of stay: 14-16 days Pt/Family Agrees to Admission and willing  to participate: Yes Program Orientation Provided & Reviewed with Pt/Caregiver Including Roles  & Responsibilities: Yes   Decrease burden of Care through IP rehab admission: not anticipated   Possible need for SNF placement upon discharge: not anticipated   Patient Condition: I have reviewed medical records from [facility], spoken with CM, and patient and spouse. I met with patient at the bedside for inpatient rehabilitation assessment.  Patient will benefit from ongoing PT and OT, can actively participate in 3 hours of therapy a day 5 days of the week, and can make measurable gains during the admission.  Patient will also benefit from the coordinated team approach during an Inpatient Acute Rehabilitation admission.  The patient will receive intensive therapy as well as Rehabilitation physician, nursing, social worker, and care management interventions.  Due to safety, skin/wound care, disease management, medication administration, pain management, and patient education the patient requires 24 hour a day rehabilitation nursing.  The patient is currently mod-max A with mobility and basic ADLs.  Discharge setting and therapy post discharge at home with home health is anticipated.  Patient has agreed to participate in the Acute Inpatient Rehabilitation Program and will admit  today.   Preadmission Screen Completed By:  Jeronimo Greaves, 07/15/2023 2:08 PM ______________________________________________________________________   Discussed status with Dr. Natale Lay on 07/17/23 at 900 and received approval for admission today.   Admission Coordinator:  Jeronimo Greaves, CCC-SLP, time 941/Date 07/17/23    Assessment/Plan: Diagnosis: cervical spondylitic myelopathy  Does the need for close, 24 hr/day Medical supervision in concert with the patient's rehab needs make it unreasonable for this patient to be served in a less intensive setting? Yes Co-Morbidities requiring supervision/potential complications: hyponatremia, UTI, neurogenic bowel, neurogenic bladder, neuropathic pain Due to bladder management, bowel management, safety, skin/wound care, disease management, medication administration, pain management, and patient education, does the patient require 24 hr/day rehab nursing? Yes Does the patient require coordinated care of a physician, rehab nurse, PT, OT, and SLP to address physical and functional deficits in the context of the above medical diagnosis(es)? Yes Addressing deficits in the following areas: balance, endurance, locomotion, strength, transferring, bowel/bladder control, bathing, dressing, feeding, grooming, toileting, and psychosocial support Can the patient actively participate in an intensive therapy program of at least 3 hrs of therapy 5 days a week? Yes The potential for patient to make measurable gains while on inpatient rehab is excellent Anticipated functional outcomes upon discharge from inpatient rehab: supervision and min assist PT, modified independent and supervision OT, n/a SLP Estimated rehab length of stay to reach the above functional goals is: 14-16 Anticipated discharge destination: Home 10. Overall Rehab/Functional Prognosis: good     MD Signature: Fanny Dance

## 2023-07-17 NOTE — Progress Notes (Signed)
Foley catheter removed per MD order. Patient tolerated well.    Tilden Dome, LPN

## 2023-07-17 NOTE — Discharge Summary (Signed)
Physician Discharge Summary  Patient ID: Leon Rodriguez MRN: [id number] DOB/AGE: 1966/01/05 58 y.o.  Admit date: 07/12/2023 Discharge date: 07/17/2023  Admission Diagnoses:  C6-T1 cervical spondylotic myelopathy  Discharge Diagnoses:  Same Principal Problem:   Cervical myelopathy [facility])   Discharged Condition: Stable  Hospital Course:  Leon Rodriguez is a 58 y.o. male who presented with cervical myelopathy due to stenosis at C6/T1.  He originally underwent an anterior decompression and fusion and then ultimately went to rehab.  During rehab stay he continued to have worsening spasms and lower extremity weakness.  Imaging revealed residual stenosis at C7-T1 therefore we discussed posterior cervical decompression and fusion C6-T1.  He underwent this and tolerated this well, postoperatively he noticed improvement in his lower extremity strength.  His wound was clean dry and intact, he was tolerating normal diet.  Physical therapy recommended return to inpatient rehab.  Treatments: Surgery -posterior cervical decompression, fusion C6/T1  Discharge Exam: Blood pressure 103/69, pulse 93, temperature 97.9 F (36.6 C), temperature source Oral, resp. rate 10, SpO2 100%. Awake, alert, oriented x 3 Speech fluent, appropriate CN grossly intact 5/5 BUE 2-3/5 BLE proximal, 3/5 distal Wound c/d/i  Disposition:  Discharge disposition: 70-Another Health Care Institution Not Defined       Discharge Instructions     Incentive spirometry RT   Complete by: As directed    Leave dressing on - Keep it clean, dry, and intact until clinic visit   Complete by: As directed       Allergies as of 07/17/2023   No Known Allergies      Medication List     ASK your doctor about these medications    acetaminophen 325 MG tablet Commonly known as: TYLENOL Take 2 tablets (650 mg total) by mouth every 6 (six) hours.   alum & mag hydroxide-simeth 200-200-20 MG/5ML suspension Commonly known  as: MAALOX/MYLANTA Take 30 mLs by mouth every 4 (four) hours as needed for indigestion.   baclofen 20 MG tablet Commonly known as: LIORESAL Take 1 tablet (20 mg total) by mouth every 6 (six) hours.   diazepam 5 MG tablet Commonly known as: VALIUM Take 1 tablet (5 mg total) by mouth every 6 (six) hours.   guaiFENesin 600 MG 12 hr tablet Commonly known as: MUCINEX Take 1 tablet (600 mg total) by mouth 2 (two) times daily.   lidocaine 2 % jelly Commonly known as: XYLOCAINE Place 1 Application into the urethra every 6 (six) hours as needed (for cathing).   lidocaine 5 % Commonly known as: LIDODERM Place 1 patch onto the skin daily. Remove & Discard patch within 12 hours or as directed by MD   morphine 15 MG 12 hr tablet Commonly known as: MS CONTIN Take 1 tablet (15 mg total) by mouth 2 (two) times daily.   oxyCODONE 5 MG immediate release tablet Commonly known as: Oxy IR/ROXICODONE Take 1 tablet (5 mg total) by mouth every 4 (four) hours as needed for breakthrough pain.   polyethylene glycol 17 g packet Commonly known as: MIRALAX / GLYCOLAX Take 17 g by mouth daily.   senna-docusate 8.6-50 MG tablet Commonly known as: Senokot-S Take 2 tablets by mouth at bedtime.   tamsulosin 0.4 MG Caps capsule Commonly known as: FLOMAX Take 1 capsule (0.4 mg total) by mouth daily after supper.   tiZANidine 4 MG tablet Commonly known as: ZANAFLEX Take 1 tablet (4 mg total) by mouth every 6 (six) hours as needed for muscle spasms.   Zantac  360 10 MG tablet Generic drug: famotidine Take 10 mg by mouth daily as needed for heartburn or indigestion.               Discharge Care Instructions  (From admission, onward)           Start     Ordered   07/17/23 0000  Leave dressing on - Keep it clean, dry, and intact until clinic visit        07/17/23 1023             Signed: Alan Mulder Emma Schupp 07/17/2023, 10:48 AM

## 2023-07-17 NOTE — Plan of Care (Signed)
  Problem: Consults Goal: RH SPINAL CORD INJURY PATIENT EDUCATION Description:  See Patient Education module for education specifics.  07/17/2023 1903 by Elgie Congo, LPN Outcome: Progressing 07/17/2023 1903 by Elgie Congo, LPN Outcome: Progressing 07/17/2023 1903 by Elgie Congo, LPN Outcome: Progressing 07/17/2023 1903 by Elgie Congo, LPN Outcome: Progressing   Problem: SCI BOWEL ELIMINATION Goal: RH STG MANAGE BOWEL WITH ASSISTANCE Description: STG Manage Bowel with medication with min Assistance. 07/17/2023 1903 by Elgie Congo, LPN Outcome: Progressing 07/17/2023 1903 by Elgie Congo, LPN Outcome: Progressing 07/17/2023 1903 by Elgie Congo, LPN Outcome: Progressing 07/17/2023 1903 by Elgie Congo, LPN Outcome: Progressing Goal: RH STG SCI MANAGE BOWEL WITH MEDICATION WITH ASSISTANCE Description: STG SCI Manage bowel with medication with assistance. 07/17/2023 1903 by Elgie Congo, LPN Outcome: Progressing 07/17/2023 1903 by Elgie Congo, LPN Outcome: Progressing 07/17/2023 1903 by Elgie Congo, LPN Outcome: Progressing 07/17/2023 1903 by Elgie Congo, LPN Outcome: Progressing   Problem: SCI BLADDER ELIMINATION Goal: RH STG MANAGE BLADDER WITH ASSISTANCE Description: STG Manage Bladder medications with minimal Assistance 07/17/2023 1903 by Elgie Congo, LPN Outcome: Progressing 07/17/2023 1903 by Elgie Congo, LPN Outcome: Progressing 07/17/2023 1903 by Elgie Congo, LPN Outcome: Progressing 07/17/2023 1903 by Elgie Congo, LPN Outcome: Progressing Goal: RH STG MANAGE BLADDER WITH MEDICATION WITH ASSISTANCE Description: STG Manage Bladder With Medication With Assistance. 07/17/2023 1903 by Elgie Congo, LPN Outcome: Progressing 07/17/2023 1903 by Elgie Congo, LPN Outcome: Progressing 07/17/2023 1903 by Elgie Congo, LPN Outcome: Progressing 07/17/2023 1903 by Elgie Congo, LPN Outcome: Progressing   Problem: RH  SAFETY Goal: RH STG ADHERE TO SAFETY PRECAUTIONS W/ASSISTANCE/DEVICE Description: STG Adhere to Safety Precautions With min Assistance/Device. 07/17/2023 1903 by Elgie Congo, LPN Outcome: Progressing 07/17/2023 1903 by Elgie Congo, LPN Outcome: Progressing 07/17/2023 1903 by Elgie Congo, LPN Outcome: Progressing 07/17/2023 1903 by Elgie Congo, LPN Outcome: Progressing   Problem: RH PAIN MANAGEMENT Goal: RH STG PAIN MANAGED AT OR BELOW PT'S PAIN GOAL Description: <4 with prns 07/17/2023 1903 by Elgie Congo, LPN Outcome: Progressing 07/17/2023 1903 by Elgie Congo, LPN Outcome: Progressing 07/17/2023 1903 by Elgie Congo, LPN Outcome: Progressing 07/17/2023 1903 by Elgie Congo, LPN Outcome: Progressing   Problem: RH KNOWLEDGE DEFICIT SCI Goal: RH STG INCREASE KNOWLEDGE OF SELF CARE AFTER SCI 07/17/2023 1903 by Elgie Congo, LPN Outcome: Progressing 07/17/2023 1903 by Elgie Congo, LPN Outcome: Progressing 07/17/2023 1903 by Elgie Congo, LPN Outcome: Progressing

## 2023-07-17 NOTE — H&P (Signed)
Physical Medicine and Rehabilitation Admission H&P    CC: Functional deficits due to cervical myelopathy with spacticity   HPI: Leon Rodriguez is a 58 year old male with history of hip pain with spasms progressing to BLE weakness and 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 He was originally admitted on 06/05/23 for ACDF C6-T1 by Dr. Jake Samples and had worsening of LE weakness with neurogenic bowel and bladder. He was admitted to CIR on 12/13 for intensive rehab program which was limited by ongoing issues with pain and spasticity. He developed sepsis due to Kleb aerogenous bacteremia secondary to UTI and required transfer to acute from 12/18-12/24 for management. He was readmitted to CIR on 06/18/23 and continued to have severe spasticity with pain limiting activity and attempts at rehab. He had side effects to multiple medications as well as issues with confusion with due to higher doses of narcotics.   Repeat MRI C spine showed severe canal stenosis at C7-T1 and Dr. Jake Samples recommended posterior cervical laminectomy C6  to T1 in hopes of improving spasticity.   He underwent bilateral C6, C7 and T1 laminectomies with posterior cervical decompression w/fusion C6/C7 and C7/T1 on 07/12/23. Post op PT and OT consulted to resume therapy. Foley has been in place due to neurogenic bladder.  Foley removed this evening at CIR. Pt reports he feels much better overall since his surgery. Patient is currently requiring mod to max +2 assist for bed mobility and to sit at EOB for 5 minutes. CIR consulted to resume intensive rehab program due to ongoing deficits.     Review of Systems  Constitutional:  Negative for fever and malaise/fatigue.  HENT:  Negative for congestion.   Eyes:  Negative for blurred vision.  Respiratory:  Negative for shortness of breath.   Cardiovascular:  Negative for chest pain.  Gastrointestinal:  Positive for constipation. Negative for nausea and vomiting.  Genitourinary:        Loss of  bladder control  Musculoskeletal:  Positive for joint pain.  Skin:  Negative for rash.  Neurological:  Positive for sensory change and focal weakness.     Past Medical History:  Diagnosis Date   Arthritis    Hip pain    Sepsis (HCC) 06/13/2023    Past Surgical History:  Procedure Laterality Date   ANTERIOR CERVICAL DECOMP/DISCECTOMY FUSION N/A 06/05/2023   Procedure: ANTERIOR CERVICAL DISCECTOMY AND FUSION CERVICAL SIX-SEVEN/CERVICAL SEVEN-THORACIC ONE;  Surgeon: Bethann Goo, DO;  Location: [facility] OR;  Service: Neurosurgery;  Laterality: N/A;  3C   APPENDECTOMY     CIRCUMCISION     HIATAL HERNIA REPAIR     HIP SURGERY     at 25 and 58 yo   POSTERIOR CERVICAL FUSION/FORAMINOTOMY N/A 07/12/2023   Procedure: POSTERIOR CERVICAL LAMINECTOMY, INSTRUMENTATION AND FUSION CERVICAL SIX-CERVICAL SEVEN, CERVICAL SEVEN-THORACIC ONE;  Surgeon: Dawley, Alan Mulder, DO;  Location: [facility] OR;  Service: Neurosurgery;  Laterality: N/A;    History reviewed. No pertinent family history.   Social History:  reports that he has been smoking cigarettes and cigars. He has never used smokeless tobacco. He reports that he does not currently use alcohol. He reports current drug use. Drug: Marijuana.   Allergies: No Known Allergies   Medications Prior to Admission  Medication Sig Dispense Refill   acetaminophen (TYLENOL) 325 MG tablet Take 2 tablets (650 mg total) by mouth every 6 (six) hours. 240 tablet 0   alum & mag hydroxide-simeth (MAALOX/MYLANTA) 200-200-20 MG/5ML suspension Take 30 mLs by  mouth every 4 (four) hours as needed for indigestion. 355 mL 0   baclofen (LIORESAL) 20 MG tablet Take 1 tablet (20 mg total) by mouth every 6 (six) hours. 30 each 0   diazepam (VALIUM) 5 MG tablet Take 1 tablet (5 mg total) by mouth every 6 (six) hours. 30 tablet 0   famotidine (ZANTAC 360) 10 MG tablet Take 10 mg by mouth daily as needed for heartburn or indigestion.     guaiFENesin (MUCINEX) 600 MG 12 hr tablet Take 1  tablet (600 mg total) by mouth 2 (two) times daily. 60 tablet 1   lidocaine (LIDODERM) 5 % Place 1 patch onto the skin daily. Remove & Discard patch within 12 hours or as directed by MD 30 patch 0   lidocaine (XYLOCAINE) 2 % jelly Place 1 Application into the urethra every 6 (six) hours as needed (for cathing). 10 mL 1   morphine (MS CONTIN) 15 MG 12 hr tablet Take 1 tablet (15 mg total) by mouth 2 (two) times daily. 60 tablet 0   oxyCODONE (OXY IR/ROXICODONE) 5 MG immediate release tablet Take 1 tablet (5 mg total) by mouth every 4 (four) hours as needed for breakthrough pain. 30 tablet 0   polyethylene glycol (MIRALAX / GLYCOLAX) 17 g packet Take 17 g by mouth daily. 14 each 0   senna-docusate (SENOKOT-S) 8.6-50 MG tablet Take 2 tablets by mouth at bedtime. 60 tablet 0   tamsulosin (FLOMAX) 0.4 MG CAPS capsule Take 1 capsule (0.4 mg total) by mouth daily after supper. 30 capsule 0   tiZANidine (ZANAFLEX) 4 MG tablet Take 1 tablet (4 mg total) by mouth every 6 (six) hours as needed for muscle spasms. 30 tablet 0        Home: Home Living Family/patient expects to be discharged to:: Private residence Living Arrangements: Spouse/significant other Available Help at Discharge: Family, Available 24 hours/day Type of Home: House Home Access: Stairs to enter Secretary/administrator of Steps: 1 Entrance Stairs-Rails: None Home Layout: Two level, Bed/bath upstairs Alternate Level Stairs-Number of Steps: flight Bathroom Shower/Tub: Engineer, manufacturing systems: Standard Bathroom Accessibility: Yes Home Equipment: Shower seat, Hand held shower head, Agricultural consultant (2 wheels), [employer] - single point, Wheelchair - manual, Crutches, Toilet riser Additional Comments: reports started using w/c in June 2024 - he uses the wheelchair downstairs for more mobility, goes downstairs 1x/day to smoke a cigar (but hasn't been downstairs in ~56month PTA), uses RW upstairs for very short distance gait to/from  bathroom for BM otherwise uses urinal for bladder -- S.O. 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 Pt had been getting in and out of tub but has had a fall there. Per CIR notes just prior to this admission, pt completed ADLs with Min to Total assist.   Functional Status:  Mobility: Bed Mobility Overal bed mobility: Needs Assistance Bed Mobility: Rolling, Sidelying to Sit, Sit to Sidelying Rolling: Mod assist Sidelying to sit: +2 for physical assistance, Mod assist, Max assist Sit to supine: Max assist, +2 for physical assistance Sit to sidelying: Max assist General bed mobility comments: cues for sequencing, assist with BLE and trunk  Transfers Overall transfer level: Needs assistance Equipment used: None Transfers: Bed to chair/wheelchair/BSC Bed to/from chair/wheelchair/BSC transfer type:: Lateral/scoot transfer  Lateral/Scoot Transfers: Mod assist, +2 physical assistance General transfer comment: nt - pt in too much pain Ambulation/Gait General Gait Details: unable at this time.   ADL: ADL Overall ADL's : Needs assistance/impaired Eating/Feeding: Modified independent, Bed level Grooming: Bed level, Set up Upper Body Bathing: Moderate assistance, Sitting Lower Body Bathing: Total assistance, +2 for physical assistance, +2 for safety/equipment, Bed level Upper Body Dressing : Moderate assistance, Sitting Lower Body Dressing: Total assistance, Bed level   Cognition: Cognition Overall Cognitive Status: Impaired/Different from baseline Orientation Level: Oriented X4 Cognition Arousal: Alert Behavior During Therapy: Flat affect Overall Cognitive  Status: Impaired/Different from baseline Area of Impairment: Following commands, Problem solving Following Commands: Follows one step commands with increased time Problem Solving: Slow processing General Comments: pt drowsy this date, just had BM x2 and received anxiety medication per pt's significant other      Cognition: Cognition Orientation Level: Oriented X4     Blood pressure 103/61, pulse 96, temperature 98 F (36.7 C), temperature source Oral, resp. rate 18, height 5\' 9"  (1.753 m), weight 89.9 kg, SpO2 100%. Physical Exam  General: No apparent distress HEENT: Head is normocephalic, atraumatic, sclera anicteric, oral mucosa moist,  wearing hard Collar Neck: Supple without JVD or lymphadenopathy Heart: Reg rate and rhythm. No murmurs rubs or gallops Chest: CTA bilaterally without wheezes, rales, or rhonchi; no distress Abdomen: Soft, non-tender, non-distended, bowel sounds positive. Extremities: No clubbing, cyanosis, or edema. Pulses are 2+ Psych: Pt's affect is appropriate. Pt is cooperative. Pleasant  Skin: Blister noted on L foot Neuro:     Mental Status: AAOx3, memory intact, fund of knowledge appropriate   CRANIAL NERVES: 2-12 grossly intact     MOTOR: RUE: 5/5 Deltoid, 5/5 Biceps, 5/5 Triceps,5/5 Grip LUE: 5/5 Deltoid, 5/5 Biceps, 5/5 Triceps, 5/5 Grip RLE: HF 2+/5, KE 3/5, ADF 4/5, APF 4/5 LLE: HF 2/5, KE 2/5, ADF 2/5, APF 2/5- difficult to test due to increased tone   Severe hypertonia b/l LE left greater than right   SENSORY: Altered sensation below T4 level    Coordination: no ataxia or dysmetria noted in UE  No results found for this or any previous visit (from the past 48 hours).  No results found.     Blood pressure 103/61, pulse 96, temperature 98 F (36.7 C), temperature source Oral, resp. rate 18, height 5\' 9"  (1.753 m), weight 89.9 kg, SpO2 100%.  Medical Problem List and Plan: 1. Functional deficits secondary to Cervical myelopathy  s/p ACDF and PCDF C6-T1 1/17 by Dr. Jake Samples  -patient may shower, please cover incision  -ELOS/Goals: 14-16, Sup to min A  -Admit to CIR  2.  Antithrombotics: -DVT/anticoagulation:  Pharmaceutical: Lovenox  -antiplatelet therapy: N/A 3. Pain Management:  Tylenol 650 mg qid. Continue MS contin  15mg  w/supper and oxycodone 5mg  prn 4. Mood/Behavior/Sleep: LCSW to follow for evaluation and support.   --was on Seroquel for sleep- restart   -Restart cymbalta at 30mg  daily  -antipsychotic agents: N/A 5. Neuropsych/cognition: This patient is intermittently  capable of making decisions on his own behalf. 6. Skin/Wound Care: Routine pressure relief measures.   --surgical dressing to stay in place with office follow up per d/c summary.  7. Fluids/Electrolytes/Nutrition: Monitor I/O. Check CMET in am  -Continue vitamin C, prosource 8. Spasticity: Continue Valium 5 mg qid, baclofen 20 mg qid, Zanaflex 4 mg every 6 hours prn.  9. Neurogenic bladder: Cath every 4 hours to keep bladder volumes < 350 cc. Continue flomax 0.4mg   --monitor PVR with bladder scan-->foley removed today. Check UA? 10. Neurogenic bowel: Was not on bowel program. Last BM documented on 01/18 --Will change senna to am, continue miralax in am.  --Schedule mini enema at nights.  -Sorbitol scheduled tomorrow      Jacquelynn Cree, PA-C 07/17/2023  I have personally performed a face to face diagnostic evaluation of this patient and formulated the key components of the plan.  Additionally, I have personally reviewed laboratory data, imaging studies, as well as relevant notes and concur with the physician assistant's documentation above.  The patient's status has not changed from the original H&P.  Any changes in documentation from the acute care chart have been noted above.  Fanny Dance, MD, [place] [facility]

## 2023-07-17 NOTE — Progress Notes (Signed)
Physical Therapy Treatment Patient Details Name: Leon Rodriguez MRN: [id number] DOB: 1965-12-22 Today's Date: 07/17/2023   History of Present Illness 58 y.o. male presenting from CIR 1/17 for posterior cervical decompression C6-T1 with instrumentation and fusion due to worsening BLE weakness and spasms. Pt with recent ACDF C6-T1 06/05/2023. PMH includes OA, hiatal hernia repair.    PT Comments  Pt seen in session with OT to progress OOB mobility. Gentle LB segmental rolling and LE ROM/stretching prior to mobility. Mod assist rolling, +2 mod/max assist sidelying to sit, and +2 max assist sit to sidelying. Pt sat EOB x 5 minutes, performing UE activities with OT. Required mod to min guard assist to maintain static sitting balance. Pt returned to supine in bed at end of session.     If plan is discharge home, recommend the following: Two people to help with walking and/or transfers;Two people to help with bathing/dressing/bathroom;Assistance with cooking/housework;Assistance with feeding;Direct supervision/assist for medications management;Direct supervision/assist for financial management;Assist for transportation;Help with stairs or ramp for entrance   Can travel by private vehicle        Equipment Recommendations  Other (comment) (defer)    Recommendations for Other Services       Precautions / Restrictions Precautions Precautions: Fall;Cervical Precaution Comments: hx of many falls, severe B LE hypertonia with muscle spasms L>R Required Braces or Orthoses: Cervical Brace Cervical Brace: Hard collar Restrictions Weight Bearing Restrictions Per Provider Order: No Other Position/Activity Restrictions: per [name] PA, Pt can wear cervical brace PRN as on 1/20     Mobility  Bed Mobility Overal bed mobility: Needs Assistance Bed Mobility: Rolling, Sidelying to Sit, Sit to Sidelying Rolling: Mod assist Sidelying to sit: +2 for physical assistance, Mod assist, Max assist    Sit to supine: Max assist, +2 for physical assistance   General bed mobility comments: cues for sequencing, assist with BLE and trunk    Transfers                        Ambulation/Gait                   Stairs             Wheelchair Mobility     Tilt Bed    Modified Rankin (Stroke Patients Only)       Balance Overall balance assessment: Needs assistance, History of Falls Sitting-balance support: Single extremity supported, Bilateral upper extremity supported, No upper extremity supported, Feet supported Sitting balance-Leahy Scale: Poor Sitting balance - Comments: Pt sat EOB x 5 minutes, mod to min guard assist. Postural control: Posterior lean, Left lateral lean                                  Cognition Arousal: Alert Behavior During Therapy: Flat affect Overall Cognitive Status: Impaired/Different from baseline Area of Impairment: Following commands, Problem solving                       Following Commands: Follows one step commands with increased time     Problem Solving: Slow processing          Exercises Other Exercises Other Exercises: lower body segmental rolling, LE gentle ROM/stretching. Both in preparation for transition to EOB    General Comments General comments (skin integrity, edema, etc.): HR into 120s with activity      Pertinent Vitals/Pain  Pain Assessment Pain Assessment: Faces Faces Pain Scale: Hurts even more Pain Location: LE spasms Pain Descriptors / Indicators: Spasm, Grimacing, Guarding, Moaning Pain Intervention(s): Monitored during session, Repositioned, Limited activity within patient's tolerance, Relaxation    Home Living                          Prior Function            PT Goals (current goals can now be found in the care plan section) Acute Rehab PT Goals Patient Stated Goal: Return to rehab Progress towards PT goals: Progressing toward goals     Frequency    Min 1X/week      PT Plan      Co-evaluation PT/OT/SLP Co-Evaluation/Treatment: Yes Reason for Co-Treatment: Complexity of the patient's impairments (multi-system involvement);For patient/therapist safety;To address functional/ADL transfers PT goals addressed during session: Mobility/safety with mobility;Balance;Strengthening/ROM        AM-PAC PT "6 Clicks" Mobility   Outcome Measure  Help needed turning from your back to your side while in a flat bed without using bedrails?: A Lot Help needed moving from lying on your back to sitting on the side of a flat bed without using bedrails?: Total Help needed moving to and from a bed to a chair (including a wheelchair)?: Total Help needed standing up from a chair using your arms (e.g., wheelchair or bedside chair)?: Total Help needed to walk in hospital room?: Total Help needed climbing 3-5 steps with a railing? : Total 6 Click Score: 7    End of Session   Activity Tolerance: Patient tolerated treatment well Patient left: in bed;with call bell/phone within reach;with family/visitor present Nurse Communication: Mobility status PT Visit Diagnosis: Other abnormalities of gait and mobility (R26.89);Muscle weakness (generalized) (M62.81)     Time: 1610-9604 PT Time Calculation (min) (ACUTE ONLY): 25 min  Charges:    $Therapeutic Activity: 8-22 mins PT General Charges $$ ACUTE PT VISIT: 1 Visit                     Ferd Glassing., PT  Office # [phone]    Ilda Foil 07/17/2023, 8:56 AM

## 2023-07-18 ENCOUNTER — Ambulatory Visit (HOSPITAL_COMMUNITY): Payer: Medicaid Other | Attending: Physical Medicine and Rehabilitation

## 2023-07-18 DIAGNOSIS — G8220 Paraplegia, unspecified: Secondary | ICD-10-CM | POA: Insufficient documentation

## 2023-07-18 LAB — COMPREHENSIVE METABOLIC PANEL
ALT: 31 U/L (ref 0–44)
AST: 29 U/L (ref 15–41)
Albumin: 2.9 g/dL — ABNORMAL LOW (ref 3.5–5.0)
Alkaline Phosphatase: 100 U/L (ref 38–126)
Anion gap: 9 (ref 5–15)
BUN: 7 mg/dL (ref 6–20)
CO2: 27 mmol/L (ref 22–32)
Calcium: 9.3 mg/dL (ref 8.9–10.3)
Chloride: 100 mmol/L (ref 98–111)
Creatinine, Ser: 0.67 mg/dL (ref 0.61–1.24)
GFR, Estimated: >60 mL/min (ref >60)
Glucose, Bld: 95 mg/dL (ref 70–99)
Potassium: 3.6 mmol/L (ref 3.5–5.1)
Sodium: 136 mmol/L (ref 135–145)
Total Bilirubin: 0.4 mg/dL (ref 0.0–1.2)
Total Protein: 6.3 g/dL — ABNORMAL LOW (ref 6.5–8.1)

## 2023-07-18 LAB — CBC WITH DIFFERENTIAL/PLATELET
Abs Immature Granulocytes: 0.02 10*3/uL (ref 0.00–0.07)
Basophils Absolute: 0.1 10*3/uL (ref 0.0–0.1)
Basophils Relative: 1 %
Eosinophils Absolute: 0.2 10*3/uL (ref 0.0–0.5)
Eosinophils Relative: 2 %
HCT: 33.1 % — ABNORMAL LOW (ref 39.0–52.0)
Hemoglobin: 11.4 g/dL — ABNORMAL LOW (ref 13.0–17.0)
Immature Granulocytes: 0 %
Lymphocytes Relative: 31 %
Lymphs Abs: 2.3 10*3/uL (ref 0.7–4.0)
MCH: 32.9 pg (ref 26.0–34.0)
MCHC: 34.4 g/dL (ref 30.0–36.0)
MCV: 95.4 fL (ref 80.0–100.0)
Monocytes Absolute: 0.7 10*3/uL (ref 0.1–1.0)
Monocytes Relative: 10 %
Neutro Abs: 4.3 10*3/uL (ref 1.7–7.7)
Neutrophils Relative %: 56 %
Platelets: 351 10*3/uL (ref 150–400)
RBC: 3.47 MIL/uL — ABNORMAL LOW (ref 4.22–5.81)
RDW: 12.2 % (ref 11.5–15.5)
WBC: 7.6 10*3/uL (ref 4.0–10.5)
nRBC: 0 % (ref 0.0–0.2)

## 2023-07-18 NOTE — Progress Notes (Signed)
Physical Therapy Session Note  Patient Details  Name: Leon Rodriguez MRN: [id number] Date of Birth: 05-04-1966  Today's Date: 07/18/2023 PT Individual Time: 2130-8657 PT Individual Time Calculation (min): 40 min   Short Term Goals: Week 1:  PT Short Term Goal 1 (Week 1): Pt will require max A with rolling in bilateral directions PT Short Term Goal 2 (Week 1): Pt will direct caregiver with dependent transfer set-up PT Short Term Goal 3 (Week 1): Pt will require min A with power wheelchair mobility 50 ft  Skilled Therapeutic Interventions/Progress Updates: Pt presented in bed with SO and friend present agreeable to therapy. Per discussion with evaluating therapist PWC was being adjusted and SO was inquiring if avail to trial. PTA obtained PWC which had been adjusted to pt's height (specifically arm rests). Pt completed supine to sit with maxA and use of bed features. Pt noted to have significant BLE ms spasms L>R although pt was able to assist with truncal support. PTA set up Slide board total A and pt completed transfer to [name] maxA x 1 with +2 present for safety. Pt instructed in PWC features including tilt/recliner/leg rests. Pt then initiated navigation performed with supervision to main gym. Pt noted to have minimal clearance when in small spaces as had x 2 near misses at doorframe and when turning corner. PTA adjusted head rest to pt's comfort and pt navigated back to room in same manner as prior. In room PTA assisted in navigated  Pt and SO instructed in charging PWC as well as keeping PWC off when pt not mobilizing.Pt left in PWC with SO and friend and brother present with current needs met.      Therapy Documentation Precautions:  Precautions Precautions: Fall, Cervical Precaution Comments: severe muscle spasms, hard collar for comfort Required Braces or Orthoses: Cervical Brace Cervical Brace: Hard collar Restrictions Weight Bearing Restrictions Per Provider Order: No Other  Position/Activity Restrictions: per [name] PA, Pt can wear cervical brace PRN as on 1/20 General:   Vital Signs: Therapy Vitals Temp: 97.8 F (36.6 C) Resp: 18 BP: 111/75 Patient Position (if appropriate): Sitting Oxygen Therapy SpO2: 98 % O2 Device: Room Air Pain: Pain Assessment Pain Scale: 0-10 Pain Score: 10-Worst pain ever Pain Type: Surgical pain;Acute pain Pain Location:  (throughout body from spasms) Pain Descriptors / Indicators: Aching;Constant Pain Onset: On-going Pain Intervention(s): Medication (See eMAR);Repositioned;Ambulation/increased activity;Distraction;Therapeutic touch;RN made aware;Relaxation;Emotional support     Therapy/Group: Individual Therapy  [name] 07/18/2023, 4:26 PM

## 2023-07-18 NOTE — Evaluation (Signed)
Physical Therapy Assessment and Plan  Patient Details  Name: Leon Rodriguez MRN: [id number] Date of Birth: [birth year]  PT Diagnosis: Ataxia, Coordination disorder, Muscle spasms, Muscle weakness, and Paraplegia Rehab Potential: Poor ELOS: 2 weeks   Today's Date: 07/18/2023 PT Individual Time: 4098-1191 PT Individual Time Calculation (min): 40 min    Hospital Problem: Active Problems:   Cervical myelopathy [facility])   Past Medical History:  Past Medical History:  Diagnosis Date   Arthritis    Hip pain    Sepsis (HCC) 06/13/2023   Past Surgical History:  Past Surgical History:  Procedure Laterality Date   ANTERIOR CERVICAL DECOMP/DISCECTOMY FUSION N/A 06/05/2023   Procedure: ANTERIOR CERVICAL DISCECTOMY AND FUSION CERVICAL SIX-SEVEN/CERVICAL SEVEN-THORACIC ONE;  Surgeon: Bethann Goo, DO;  Location: [facility] OR;  Service: Neurosurgery;  Laterality: N/A;  3C   APPENDECTOMY     CIRCUMCISION     HIATAL HERNIA REPAIR     HIP SURGERY     at 76 and 58 yo   POSTERIOR CERVICAL FUSION/FORAMINOTOMY N/A 07/12/2023   Procedure: POSTERIOR CERVICAL LAMINECTOMY, INSTRUMENTATION AND FUSION CERVICAL SIX-CERVICAL SEVEN, CERVICAL SEVEN-THORACIC ONE;  Surgeon: Dawley, Alan Mulder, DO;  Location: [facility] OR;  Service: Neurosurgery;  Laterality: N/A;    Assessment & Plan Clinical Impression: Patient is a 58 y.o. year old male with history of hip pain with spasms progressing to BLE weakness and 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 He was originally admitted on 06/05/23 for ACDF C6-T1 by Dr. Jake Samples and had worsening of LE weakness with neurogenic bowel and bladder. He was admitted to CIR on 12/13 for intensive rehab program which was limited by ongoing issues with pain and spasticity. He developed sepsis due to Kleb aerogenous bacteremia secondary to UTI and required transfer to acute from 12/18-12/24 for management. He was readmitted to CIR on 06/18/23 and continued to have severe spasticity with pain limiting activity  and attempts at rehab. He had side effects to multiple medications as well as issues with confusion with due to higher doses of narcotics.    Repeat MRI C spine showed severe canal stenosis at C7-T1 and Dr. Jake Samples recommended posterior cervical laminectomy C6  to T1 in hopes of improving spasticity.   He underwent bilateral C6, C7 and T1 laminectomies with posterior cervical decompression w/fusion C6/C7 and C7/T1 on 07/12/23. Post op PT and OT consulted to resume therapy. Foley has been in place due to neurogenic bladder.  Foley removed this evening at CIR. Pt reports he feels much better overall since his surgery. Patient is currently requiring mod to max +2 assist for bed mobility and to sit at EOB for 5 minutes. CIR consulted to resume intensive rehab program due to ongoing deficits.   Patient transferred to CIR on 07/17/2023 .   Patient currently requires total with mobility secondary to muscle weakness, muscle joint tightness, and muscle paralysis, abnormal tone, unbalanced muscle activation, ataxia, and decreased coordination, and decreased sitting balance, decreased postural control, and decreased balance strategies.  Prior to hospitalization, patient was min with mobility and lived with Significant other in a House home.  Home access is 2 (more of a threshhold)Stairs to enter.  Patient will benefit from skilled PT intervention to maximize safe functional mobility, minimize fall risk, and decrease caregiver burden for planned discharge home with 24 hour assist.  Anticipate patient will benefit from follow up [facility] at discharge.  PT - End of Session Activity Tolerance: Tolerates 10 - 20 min activity with multiple rests Endurance Deficit: Yes  Endurance Deficit Description: decreased PT Assessment Rehab Potential (ACUTE/IP ONLY): Poor PT Barriers to Discharge: Home environment access/layout;Inaccessible home environment;Decreased caregiver support;Lack of/limited family support;Incontinence;Neurogenic  Bowel & Bladder;Wound Care;Other (comments) PT Barriers to Discharge Comments: Bilateral LE spasticty PT Patient demonstrates impairments in the following area(s): Balance;Endurance;Pain;Sensory;Motor;Nutrition;Edema;Safety PT Transfers Functional Problem(s): Bed Mobility;Bed to Chair PT Locomotion Functional Problem(s): Wheelchair Mobility PT Plan PT Intensity: Minimum of 1-2 x/day ,45 to 90 minutes PT Frequency: 5 out of 7 days PT Duration Estimated Length of Stay: 2 weeks PT Treatment/Interventions: Ambulation/gait training;Community reintegration;DME/adaptive equipment instruction;Neuromuscular re-education;Stair training;Psychosocial support;UE/LE Strength taining/ROM;Wheelchair propulsion/positioning;Balance/vestibular training;Discharge planning;Functional electrical stimulation;Pain management;Skin care/wound management;Therapeutic Activities;UE/LE Coordination activities;Cognitive remediation/compensation;Disease management/prevention;Splinting/orthotics;Functional mobility training;Patient/family education;Therapeutic Exercise;Visual/perceptual remediation/compensation PT Transfers Anticipated Outcome(s): dependent PT Locomotion Anticipated Outcome(s): CGA PT Recommendation Recommendations for Other Services: Neuropsych consult;Therapeutic Recreation consult Therapeutic Recreation Interventions: Stress management Follow Up Recommendations: Home health PT;24 hour supervision/assistance Patient destination: Home Equipment Recommended: Wheelchair (measurements);Other (comment) Equipment Details: hoyer, specialty w/c   PT Evaluation Precautions/Restrictions Precautions Precautions: Fall;Cervical Precaution Comments: severe muscle spasms, hard collar for comfort Required Braces or Orthoses: Cervical Brace Cervical Brace: Hard collar Restrictions Weight Bearing Restrictions Per Provider Order: No Other Position/Activity Restrictions: per [name] PA, Pt can wear cervical brace  PRN as on 1/20 General   Vital SignsTherapy Vitals Temp: 97.8 F (36.6 C) Resp: 18 BP: 111/75 Patient Position (if appropriate): Sitting Oxygen Therapy SpO2: 98 % O2 Device: Room Air Pain Pain Assessment Pain Scale: 0-10 Pain Score: 10-Worst pain ever Pain Type: Surgical pain;Acute pain Pain Location:  (throughout body from spasms) Pain Descriptors / Indicators: Aching;Constant Pain Onset: On-going Pain Intervention(s): Medication (See eMAR);Repositioned;Ambulation/increased activity;Distraction;Therapeutic touch;RN made aware;Relaxation;Emotional support Pain Interference Pain Interference Pain Effect on Sleep: 3. Frequently Pain Interference with Therapy Activities: 0. Does not apply - I have not received rehabilitationtherapy in the past 5 days Pain Interference with Day-to-Day Activities: 8. Unable to answer Home Living/Prior Functioning Home Living Available Help at Discharge: Family;Available 24 hours/day Type of Home: House Home Access: Stairs to enter [employer] of Steps: 2 (more of a threshhold) Entrance Stairs-Rails: None Home Layout: Two level;Bed/bath upstairs;1/2 bath on main level Alternate Level Stairs-Number of Steps: flight Alternate Level Stairs-Rails: None Bathroom Shower/Tub: Tub/shower unit Bathroom Toilet: Standard Bathroom Accessibility: Yes Additional Comments: reports started using w/c in June 2024 - he uses the wheelchair downstairs for more mobility, goes downstairs 1x/day to smoke a cigar (but hasn't been downstairs in ~2month PTA), uses RW upstairs for very short distance gait to/from bathroom for BM otherwise uses urinal for bladder -- S.O. 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 Planning on putting extension for another bathroom handicap accessible  Lives With: Significant other Prior Function Level of Independence: Requires assistive  device for independence;Needs assistance with ADLs;Needs assistance with gait  Able to Take Stairs?: No Driving: No Vocation: Full time employment Vocation Requirements: Advertising account planner Vision/Perception  Vision - History Ability to See in Adequate Light: 0 Adequate Perception Perception: Within Functional Limits Praxis Praxis: WFL  Cognition Overall Cognitive Status: Impaired/Different from baseline Arousal/Alertness: Awake/alert Orientation Level: Oriented X4 Focused Attention: Appears intact Sustained Attention: Appears intact Memory: Appears intact Awareness: Appears intact Problem Solving: Impaired Problem Solving Impairment: Functional basic Safety/Judgment: Appears intact Sensation Sensation Light Touch: Impaired Detail Peripheral sensation comments: Rt arm and hand aching, reports no numbness/tingling Light Touch Impaired Details: Impaired RLE;Impaired LLE Hot/Cold: Appears Intact Proprioception:  Impaired Detail Proprioception Impaired Details: Impaired LLE;Impaired RLE Coordination Gross Motor Movements are Fluid and Coordinated: No Fine Motor Movements are Fluid and Coordinated: No Coordination and Movement Description: impaired due to B LE muscle spasticity, weakness, and sensory deficits Heel Shin Test: UTA Motor  Motor Motor: Abnormal tone;Paraplegia;Other (comment);Abnormal postural alignment and control Motor - Skilled Clinical Observations: severe B LE hypertonia with muscle spasms, B LE weakness, and sensory deficits   Trunk/Postural Assessment  Cervical Assessment Cervical Assessment: Exceptions to [facility] (cervical collar) Cervical AROM Overall Cervical AROM: Due to precautions Cervical Strength Overall Cervical Strength: Due to precautions Thoracic Assessment Thoracic Assessment: Exceptions to [facility] (rounded shoulders) Lumbar Assessment Lumbar Assessment: Exceptions to [facility] (posterior pelvic tilt) Postural Control Postural Control: Deficits on  evaluation Trunk Control: impaired and stays in a posterior pelvic tilt position due to B LE extensor tone  Balance Balance Balance Assessed: Yes Static Sitting Balance Static Sitting - Balance Support: Feet supported;Bilateral upper extremity supported Static Sitting - Level of Assistance: 3: Mod assist Dynamic Sitting Balance Dynamic Sitting - Balance Support: Feet supported;During functional activity Dynamic Sitting - Level of Assistance: 2: Max assist Dynamic Sitting - Balance Activities: Reaching across midline;Reaching for objects Sitting balance - Comments: Pt reaching in various planes Min-Mod A to retrieve items while seated EOB Extremity Assessment  RUE Assessment RUE Assessment: Within Functional Limits Active Range of Motion (AROM) Comments: WFL General Strength Comments: overall 4/5 LUE Assessment LUE Assessment: Within Functional Limits Active Range of Motion (AROM) Comments: WFL General Strength Comments: overall 4/5 RLE Assessment RLE Assessment: Not tested General Strength Comments: unable to accurately assess d/t tone LLE Assessment LLE Assessment: Not tested General Strength Comments: unable to accurately assess d/t tone LLE Tone LLE Tone Comments: spasms in LEs with adductor bias causing his legs to cross  Care Tool Care Tool Bed Mobility Roll left and right activity   Roll left and right assist level: Total Assistance - Patient < 25%    Sit to lying activity   Sit to lying assist level: Total Assistance - Patient < 25%    Lying to sitting on side of bed activity   Lying to sitting on side of bed assist level: the ability to move from lying on the back to sitting on the side of the bed with no back support.: Total Assistance - Patient < 25%     Care Tool Transfers Sit to stand transfer Sit to stand activity did not occur: Safety/medical concerns (spasticity; weakness)      Chair/bed transfer Chair/bed transfer activity did not occur: Safety/medical  concerns Chair/bed transfer assist level: 2 Web designer transfer activity did not occur: Safety/medical concerns (spasticity; weakness)        Care Tool Locomotion Ambulation Ambulation activity did not occur: Safety/medical concerns (spasticity; weakness)        Walk 10 feet activity Walk 10 feet activity did not occur: Safety/medical concerns       Walk 50 feet with 2 turns activity Walk 50 feet with 2 turns activity did not occur: Safety/medical concerns      Walk 150 feet activity Walk 150 feet activity did not occur: Safety/medical concerns      Walk 10 feet on uneven surfaces activity Walk 10 feet on uneven surfaces activity did not occur: Safety/medical concerns      Stairs Stair activity did not occur: Safety/medical concerns (spasticity; weakness)        Walk up/down 1 step activity Walk up/down  1 step or curb (drop down) activity did not occur: Safety/medical concerns      Walk up/down 4 steps activity Walk up/down 4 steps activity did not occur: Safety/medical concerns      Walk up/down 12 steps activity Walk up/down 12 steps activity did not occur: Safety/medical concerns      Pick up small objects from floor Pick up small object from the floor (from standing position) activity did not occur: Safety/medical concerns (spasticity; weakness)      Wheelchair Is the patient using a wheelchair?: Yes Type of Wheelchair: Power Wheelchair activity did not occur: Safety/medical concerns (w/c malfunction)      Wheel 50 feet with 2 turns activity Wheelchair 50 feet with 2 turns activity did not occur: Safety/medical concerns    Wheel 150 feet activity Wheelchair 150 feet activity did not occur: Safety/medical concerns      Refer to Care Plan for Long Term Goals  SHORT TERM GOAL WEEK 1 PT Short Term Goal 1 (Week 1): Pt will require max A with rolling in bilateral directions PT Short Term Goal 2 (Week 1): Pt will direct caregiver with dependent  transfer set-up PT Short Term Goal 3 (Week 1): Pt will require min A with power wheelchair mobility 50 ft  Recommendations for other services: Neuropsych and Therapeutic Recreation  Stress management  Skilled Therapeutic Intervention Mobility Bed Mobility Bed Mobility: Sit to Supine;Rolling Right;Rolling Left Rolling Right: Total Assistance - Patient < 25% Rolling Left: Total Assistance - Patient < 25% Supine to Sit: Maximal Assistance - Patient - Patient 25-49% Sit to Supine: Total Assistance - Patient < 25% Transfers Transfers: Lateral/Scoot Transfers Sit to Stand: Maximal Assistance - Patient 25-49%;2 Helpers Stand Pivot Transfer Details: Tactile cues for initiation;Tactile cues for posture;Visual cues for safe use of DME/AE;Verbal cues for sequencing;Verbal cues for technique;Verbal cues for safe use of DME/AE Lateral/Scoot Transfers: 2 Helpers Lateral Transfer Comment: verbal and tactile cues for forward lean, head/hips relationship, technique, sequencing, and posture Transfer (Assistive device): Other (Comment) (transfer board) Locomotion  Gait Ambulation: No Gait Gait: No Stairs / Additional Locomotion Stairs: No Wheelchair Mobility Wheelchair Mobility: No  Pt agreeable to PT session and S/O present throughout. PT assessed pain interference, sensation, and spasticity. Session largely limited 2/2 B LE spasms Lt> Rt LE. Provided gentle movement, oscillations and 5 # ankle weights for spasticity and pain management. Pt received in TIS and reports uncomfortable and would prefer w/c that facilitates independence. PT discussed with primary OTA and PTA and provided pt with power wheelchair with hybrid Roho cushion. Pt total A with lateral transfer with use of board from TIS>mat>PWC. PT attempted to customize height of arm rests for posture, unable to successfully perform therefore pt returned to room and total A with lateral transfer and sit to lying. Pt left semi-reclined in bed with  all needs in reach and S/O present. PT plans to continue modifications to PWC.   Discharge Criteria: Patient will be discharged from PT if patient refuses treatment 3 consecutive times without medical reason, if treatment goals not met, if there is a change in medical status, if patient makes no progress towards goals or if patient is discharged from hospital.  The above assessment, treatment plan, treatment alternatives and goals were discussed and mutually agreed upon: by patient and by family  Priscille Kluver PT, DPT  07/18/2023, 4:28 PM

## 2023-07-18 NOTE — Progress Notes (Signed)
Inpatient Rehabilitation Care Coordinator Assessment and Plan Patient Details  Name: Leon Rodriguez MRN: [id number] Date of Birth: [birth year]  Today's Date: 07/18/2023  Hospital Problems: Principal Problem:   Paraplegia [facility])  Past Medical History:  Past Medical History:  Diagnosis Date   Arthritis    Hip pain    Sepsis (HCC) 06/13/2023   Past Surgical History:  Past Surgical History:  Procedure Laterality Date   ANTERIOR CERVICAL DECOMP/DISCECTOMY FUSION N/A 06/05/2023   Procedure: ANTERIOR CERVICAL DISCECTOMY AND FUSION CERVICAL SIX-SEVEN/CERVICAL SEVEN-THORACIC ONE;  Surgeon: Bethann Goo, DO;  Location: [facility] OR;  Service: Neurosurgery;  Laterality: N/A;  3C   APPENDECTOMY     CIRCUMCISION     HIATAL HERNIA REPAIR     HIP SURGERY     at 81 and 58 yo   POSTERIOR CERVICAL FUSION/FORAMINOTOMY N/A 07/12/2023   Procedure: POSTERIOR CERVICAL LAMINECTOMY, INSTRUMENTATION AND FUSION CERVICAL SIX-CERVICAL SEVEN, CERVICAL SEVEN-THORACIC ONE;  Surgeon: Dawley, Alan Mulder, DO;  Location: [facility] OR;  Service: Neurosurgery;  Laterality: N/A;   Social History:  reports that he has been smoking cigarettes and cigars. He has never used smokeless tobacco. He reports that he does not currently use alcohol. He reports current drug use. Drug: Marijuana.  Family / Support Systems Marital Status: Married How Long?: 3 years Patient Roles: Spouse Spouse/Significant Other: [name] Children: No children together; Pt thas 1 dtr- Chanelle and lives in [place], [place] Other Supports: none reported Anticipated Caregiver: s/o [name] Ability/Limitations of Caregiver: Wife was initially going to return to work middle Jan 2025. SW will confirm. Unsure on amount of support at home since neither of them are working at this time. Caregiver Availability: 24/7 Family Dynamics: Pt lives with his wife.  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 Pt is not a Cytogeneticist. 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) Return to Work Plans: TBD Marine scientist Issues: Denies Guardian/Conservator: N/A   Abuse/Neglect Abuse/Neglect Assessment Can Be Completed: Yes Physical Abuse: Denies Verbal Abuse: Denies Sexual Abuse: Denies Exploitation of patient/patient's resources: Denies Self-Neglect: Denies  Patient response to: Social Isolation - How often do you feel lonely or isolated from those around you?: Never  Emotional Status Pt's affect, behavior and adjustment status: Pt adjusting as well as to be expected Recent Psychosocial Issues: Adjusting to being here in the hospital Psychiatric History: Denies Substance Abuse History: Pt smokes cigars atleast two per day; a couple of beers daily; marijuana use once a day.  Patient / Family Perceptions, Expectations & Goals Pt/Family understanding of illness & functional limitations: Pt and family have a general understanding of care needs Premorbid pt/family roles/activities: Assistance with ADLs/IADLs Anticipated changes in roles/activities/participation: Assistance with ADLs/IADLs Pt/family expectations/goals: Pt goal is to work on being as independent as possible  Building surveyor: None Premorbid Home Care/DME Agencies: Other (Comment) (PCS referral needs to have follow-up with [facility] when discharge date in place. CAP/DA referral submitted. [facility] for HHPT/OT/aide.) Transportation available at discharge: s/o [name] Is the patient able to respond to transportation needs?: Yes In the past 12 months, has lack of transportation kept you from medical appointments or from  getting medications?: No In the past 12  months, has lack of transportation kept you from meetings, work, or from getting things needed for daily living?: No Resource referrals recommended: Neuropsychology  Discharge Planning Living Arrangements: Spouse/significant other Support Systems: Spouse/significant other Type of Residence: Private residence Insurance Resources: Media planner (specify) ([place] Healthy [employer]) Surveyor, quantity Resources: Family Support Financial Screen Referred: No Living Expenses: Psychologist, sport and exercise Management: Patient, Spouse Does the patient have any problems obtaining your medications?: No Home Management: Pt s/o will manage home care needs Patient/Family Preliminary Plans: No changes Care Coordinator Barriers to Discharge: Decreased caregiver support, Lack of/limited family support, Insurance for SNF coverage Care Coordinator Anticipated Follow Up Needs: HH/OP  Clinical Impression SW familiar with pt from previous admissions. SW will coordinate care as recommended.   Avon Molock A Lorma Heater 07/18/2023, 12:21 PM

## 2023-07-18 NOTE — Evaluation (Signed)
Occupational Therapy Assessment and Plan  Patient Details  Name: Leon Rodriguez MRN: [id number] Date of Birth: 1966/05/20  OT Diagnosis: abnormal posture, acute pain, lumbago (low back pain), and muscle weakness (generalized) Rehab Potential: Rehab Potential (ACUTE ONLY): Fair ELOS: 28-30 days   Today's Date: 07/18/2023 OT Individual Time: 7846-9629 OT Individual Time Calculation (min): 75 min     Hospital Problem: Principal Problem:   Paraplegia [facility])   Past Medical History:  Past Medical History:  Diagnosis Date   Arthritis    Hip pain    Sepsis (HCC) 06/13/2023   Past Surgical History:  Past Surgical History:  Procedure Laterality Date   ANTERIOR CERVICAL DECOMP/DISCECTOMY FUSION N/A 06/05/2023   Procedure: ANTERIOR CERVICAL DISCECTOMY AND FUSION CERVICAL SIX-SEVEN/CERVICAL SEVEN-THORACIC ONE;  Surgeon: Bethann Goo, DO;  Location: [facility] OR;  Service: Neurosurgery;  Laterality: N/A;  3C   APPENDECTOMY     CIRCUMCISION     HIATAL HERNIA REPAIR     HIP SURGERY     at 44 and 58 yo   POSTERIOR CERVICAL FUSION/FORAMINOTOMY N/A 07/12/2023   Procedure: POSTERIOR CERVICAL LAMINECTOMY, INSTRUMENTATION AND FUSION CERVICAL SIX-CERVICAL SEVEN, CERVICAL SEVEN-THORACIC ONE;  Surgeon: Dawley, Alan Mulder, DO;  Location: [facility] OR;  Service: Neurosurgery;  Laterality: N/A;    Assessment & Plan Clinical Impression:  Leon Rodriguez is a 58 year old male with history of hip pain with spasms progressing to BLE weakness and 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 He was originally admitted on 06/05/23 for ACDF C6-T1 by Dr. Jake Samples and had worsening of LE weakness with neurogenic bowel and bladder. He was admitted to CIR on 12/13 for intensive rehab program which was limited by ongoing issues with pain and spasticity. He developed sepsis due to Kleb aerogenous bacteremia secondary to UTI and required transfer to acute from 12/18-12/24 for management. He was readmitted to CIR on 06/18/23 and continued to  have severe spasticity with pain limiting activity and attempts at rehab. He had side effects to multiple medications as well as issues with confusion with due to higher doses of narcotics.    Repeat MRI C spine showed severe canal stenosis at C7-T1 and Dr. Jake Samples recommended posterior cervical laminectomy C6  to T1 in hopes of improving spasticity.   He underwent bilateral C6, C7 and T1 laminectomies with posterior cervical decompression w/fusion C6/C7 and C7/T1 on 07/12/23. Post op PT and OT consulted to resume therapy. Foley has been in place due to neurogenic bladder.  Foley removed this evening at CIR. Pt reports he feels much better overall since his surgery. Patient is currently requiring mod to max +2 assist for bed mobility and to sit at EOB for 5 minutes. CIR consulted to resume intensive rehab program due to ongoing deficits.  .  Patient transferred to CIR on 07/17/2023 .    Patient currently requires total with basic self-care skills secondary to muscle weakness, muscle joint tightness, and muscle paralysis, decreased cardiorespiratoy endurance, impaired timing and sequencing, abnormal tone, unbalanced muscle activation, decreased coordination, and decreased motor planning, and decreased sitting balance, decreased standing balance, decreased postural control, and decreased balance strategies.  Prior to hospitalization, patient could complete ADLs with independent .  Patient will benefit from skilled intervention to decrease level of assist with basic self-care skills and increase independence with basic self-care skills prior to discharge home with care partner.  Anticipate patient will require 24 hour supervision and follow up home health.  OT - End of Session Activity Tolerance: Tolerates  10 - 20 min activity with multiple rests Endurance Deficit: Yes OT Assessment Rehab Potential (ACUTE ONLY): Fair OT Barriers to Discharge: Home environment access/layout;Other (comments);Neurogenic Bowel &  Bladder;Inaccessible home environment OT Patient demonstrates impairments in the following area(s): Balance;Safety;Sensory;Skin Integrity;Endurance;Motor;Pain OT Basic ADL's Functional Problem(s): Eating;Grooming;Bathing;Dressing;Toileting OT Transfers Functional Problem(s): Toilet;Tub/Shower OT Additional Impairment(s): None OT Plan OT Intensity: Minimum of 1-2 x/day, 45 to 90 minutes OT Frequency: 5 out of 7 days OT Duration/Estimated Length of Stay: 28-30 days OT Treatment/Interventions: DME/adaptive equipment instruction;Therapeutic Activities;UE/LE Strength taining/ROM;Therapeutic Exercise;Self Care/advanced ADL retraining;Balance/vestibular training;UE/LE Coordination activities;Patient/family education;Psychosocial support;Functional mobility training;Discharge planning;Neuromuscular re-education;Pain management;Cognitive remediation/compensation;Disease mangement/prevention;Community reintegration;Splinting/orthotics;Wheelchair propulsion/positioning;Functional electrical stimulation;Skin care/wound managment OT Self Feeding Anticipated Outcome(s): set-up OT Basic Self-Care Anticipated Outcome(s): set up UB self care, Mod A LB self care/toileting, Min A bathing OT Toileting Anticipated Outcome(s): Mod A OT Bathroom Transfers Anticipated Outcome(s): Mod A OT Recommendation Recommendations for Other Services: Neuropsych consult;Therapeutic Recreation consult Therapeutic Recreation Interventions: Pet therapy;Stress management Patient destination: Home Follow Up Recommendations: Home health OT Equipment Recommended: To be determined  OT Evaluation Precautions/Restrictions  Precautions Precautions: Fall;Cervical Precaution Comments: severe muscle spasms, hard collar for comfort Required Braces or Orthoses: Cervical Brace Cervical Brace: Hard collar Restrictions Weight Bearing Restrictions Per Provider Order: No Other Position/Activity Restrictions: per [name] PA, Pt can wear  cervical brace PRN as on 1/20 General Chart Reviewed: Yes Family/Caregiver Present: Yes (wife) Pain Pain Assessment Pain Scale: 0-10 Pain Score: 10-Worst pain ever Pain Type: Surgical pain;Acute pain Pain Location:  (throughout body from spasms) Pain Descriptors / Indicators: Aching;Constant Pain Onset: On-going Pain Intervention(s): Medication (See eMAR);Repositioned;Ambulation/increased activity;Distraction;Therapeutic touch;RN made aware;Relaxation;Emotional support Home Living/Prior Functioning Home Living Family/patient expects to be discharged to:: Private residence Living Arrangements: Spouse/significant other Available Help at Discharge: Family, Available 24 hours/day Type of Home: House Home Access: Stairs to enter [employer] of Steps: 2 (more of a threshhold) Entrance Stairs-Rails: None Home Layout: Two level, Bed/bath upstairs, 1/2 bath on main level Alternate Level Stairs-Number of Steps: flight Alternate Level Stairs-Rails: None Bathroom Shower/Tub: Tub/shower unit Bathroom Toilet: Standard Bathroom Accessibility: Yes Additional Comments: reports started using w/c in June 2024 - he uses the wheelchair downstairs for more mobility, goes downstairs 1x/day to smoke a cigar (but hasn't been downstairs in ~22month PTA), uses RW upstairs for very short distance gait to/from bathroom for BM otherwise uses urinal for bladder -- S.O. 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 Planning on putting extension for another bathroom handicap accessible  Lives With: Spouse IADL History Homemaking Responsibilities: No Current License: Yes Occupation: Full time employment Prior Function Level of Independence: Requires assistive device for independence, Needs assistance with ADLs, Needs assistance with gait (from previous stay)  Able to Take Stairs?: No Driving: No Vocation: Full  time employment Vocation Requirements: Naval architect Baseline Vision/History: 0 No visual deficits Ability to See in Adequate Light: 0 Adequate Patient Visual Report: No change from baseline Vision Assessment?: No apparent visual deficits Perception  Perception: Within Functional Limits Praxis Praxis: WFL Cognition Cognition Overall Cognitive Status: Impaired/Different from baseline Arousal/Alertness: Awake/alert Orientation Level: Person;Place;Situation Person: Oriented Place: Oriented Situation: Oriented Memory: Appears intact Focused Attention: Appears intact Sustained Attention: Appears intact Awareness: Appears intact Problem Solving: Impaired Problem Solving Impairment: Functional basic Safety/Judgment: Appears intact Brief Interview for Mental Status (BIMS) Repetition of Three Words (First Attempt): 3 Temporal Orientation: Year: Correct Temporal Orientation: Month: Accurate  within 5 days Temporal Orientation: Day: Correct Recall: "Sock": Yes, no cue required Recall: "Blue": Yes, no cue required Recall: "Bed": Yes, no cue required BIMS Summary Score: 15 Sensation Sensation Light Touch: Impaired Detail Peripheral sensation comments: Rt arm and hand aching, reports no numbness/tingling Light Touch Impaired Details: Impaired RLE;Impaired LLE Hot/Cold: Appears Intact Proprioception: Impaired Detail Proprioception Impaired Details: Impaired LLE;Impaired RLE Coordination Gross Motor Movements are Fluid and Coordinated: No Fine Motor Movements are Fluid and Coordinated: No Coordination and Movement Description: impaired due to B LE muscle spasms, weakness, and sensory deficits Motor  Motor Motor: Abnormal tone;Paraplegia;Other (comment);Abnormal postural alignment and control Motor - Skilled Clinical Observations: severe B LE hypertonia with muscle spasms, B LE weakness, and sensory deficits  Trunk/Postural Assessment  Cervical Assessment Cervical  Assessment: Exceptions to [facility] (cervical collar) Thoracic Assessment Thoracic Assessment: Exceptions to [facility] (rounded shoulders) Lumbar Assessment Lumbar Assessment: Exceptions to [facility] (posterior pelvic tilt) Postural Control Postural Control: Deficits on evaluation Trunk Control: impaired and stays in a posterior pelvic tilt position due to B LE extensor tone  Balance Balance Balance Assessed: Yes Static Sitting Balance Static Sitting - Balance Support: Feet supported;Bilateral upper extremity supported Static Sitting - Level of Assistance: 4: Min assist;5: Stand by assistance (intermittent Min A for posterior lean) Dynamic Sitting Balance Dynamic Sitting - Balance Support: Feet supported;During functional activity Dynamic Sitting - Level of Assistance: 3: Mod assist Dynamic Sitting - Balance Activities: Reaching across midline;Reaching for objects Sitting balance - Comments: Pt reaching in various planes Min-Mod A to retrieve items while seated EOB Extremity/Trunk Assessment RUE Assessment RUE Assessment: Within Functional Limits Active Range of Motion (AROM) Comments: WFL General Strength Comments: overall 4/5 LUE Assessment LUE Assessment: Within Functional Limits Active Range of Motion (AROM) Comments: WFL General Strength Comments: overall 4/5  Care Tool Care Tool Self Care Eating   Eating Assist Level: Set up assist    Oral Care    Oral Care Assist Level: Minimal Assistance - Patient > 75%    Bathing   Body parts bathed by patient: Right arm;Left arm;Chest;Abdomen;Front perineal area;Face Body parts bathed by helper: Buttocks;Right upper leg;Right lower leg;Left lower leg;Left upper leg   Assist Level: Maximal Assistance - Patient 24 - 49%    Upper Body Dressing(including orthotics)   What is the patient wearing?: Pull over shirt   Assist Level: Moderate Assistance - Patient 50 - 74%    Lower Body Dressing (excluding footwear)   What is the patient wearing?:  Pants;Incontinence brief Assist for lower body dressing: Dependent - Patient 0%    Putting on/Taking off footwear   What is the patient wearing?: Socks;Shoes Assist for footwear: Dependent - Patient 0%       Care Tool Toileting Toileting activity   Assist for toileting: 2 Helpers     Care Tool Bed Mobility Roll left and right activity   Roll left and right assist level: Maximal Assistance - Patient 25 - 49%    Sit to lying activity   Sit to lying assist level: Maximal Assistance - Patient 25 - 49%    Lying to sitting on side of bed activity   Lying to sitting on side of bed assist level: the ability to move from lying on the back to sitting on the side of the bed with no back support.: Total Assistance - Patient < 25%     Care Tool Transfers Sit to stand transfer Sit to stand activity did not occur: Safety/medical concerns  Chair/bed transfer Chair/bed transfer activity did not occur: Safety/medical concerns Chair/bed transfer assist level: 2 Helpers (lateral)     Doctor, general practice transfer activity did not occur: Safety/medical concerns       Care Tool Cognition  Expression of Ideas and Wants Expression of Ideas and Wants: 4. Without difficulty (complex and basic) - expresses complex messages without difficulty and with speech that is clear and easy to understand  Understanding Verbal and Non-Verbal Content Understanding Verbal and Non-Verbal Content: 4. Understands (complex and basic) - clear comprehension without cues or repetitions   Memory/Recall Ability Memory/Recall Ability : Current season;Location of own room;Staff names and faces;That he or she is in a hospital/hospital unit   Refer to Care Plan for Long Term Goals  SHORT TERM GOAL WEEK 1 OT Short Term Goal 1 (Week 1): Pt will complete bed mobility Mod A in preparation for LB ADLs OT Short Term Goal 2 (Week 1): Pt will complete LB dressing Max A OT Short Term Goal 3 (Week 1): Pt will complete UB  dressing SBA  Recommendations for other services: Neuropsych and Therapeutic Recreation  Pet therapy and Stress management   Skilled Therapeutic Intervention ADL ADL Eating: Set up Where Assessed-Eating: Bed level Grooming: Minimal assistance Where Assessed-Grooming: Bed level Upper Body Bathing: Moderate assistance Where Assessed-Upper Body Bathing: Bed level Lower Body Bathing: Dependent Where Assessed-Lower Body Bathing: Bed level Upper Body Dressing: Moderate assistance Where Assessed-Upper Body Dressing: Edge of bed Lower Body Dressing: Dependent Where Assessed-Lower Body Dressing: Bed level Toileting: Dependent Toilet Transfer: Not assessed (bed level d/t severe tone) Tub/Shower Transfer: Not assessed Film/video editor: Unable to assess (safety concerns) Film/video editor Method: Unable to assess Mobility  Bed Mobility Bed Mobility: Rolling Left;Rolling Right;Supine to Sit Rolling Right: Maximal Assistance - Patient 25-49% Rolling Left: Maximal Assistance - Patient 25-49% Supine to Sit: Maximal Assistance - Patient - Patient 25-49% Sit to Supine: Total Assistance - Patient < 25% Transfers Sit to Stand: Maximal Assistance - Patient 25-49%;2 Helpers   1:1 evaluation and treatment session initiated this date. OT roles, goals and purpose discussed with pt as well as therapy schedule. ADL completed this date with levels of assist listed above. Pt in severe pain for eval this AM with severe full body spasms. Pt wearing cervical collar for comfort during mobility. Pt completing ADLs bed level. Pt with bed>TIS W/C with lateral board. Pt would benefit from skilled OT in IPR setting in order to maximize independence with ADLs upon D/C.    Discharge Criteria: Patient will be discharged from OT if patient refuses treatment 3 consecutive times without medical reason, if treatment goals not met, if there is a change in medical status, if patient makes no progress towards  goals or if patient is discharged from hospital.  The above assessment, treatment plan, treatment alternatives and goals were discussed and mutually agreed upon: by patient  Velia Meyer, OTD, OTR/L 07/18/2023, 1:00 PM

## 2023-07-18 NOTE — Plan of Care (Signed)
  Problem: RH Balance Goal: LTG: Patient will maintain dynamic sitting balance (OT) Description: LTG:  Patient will maintain dynamic sitting balance with assistance during activities of daily living (OT) Flowsheets (Taken 07/18/2023 1308) LTG: Pt will maintain dynamic sitting balance during ADLs with: Supervision/Verbal cueing   Problem: RH Grooming Goal: LTG Patient will perform grooming w/assist,cues/equip (OT) Description: LTG: Patient will perform grooming with assist, with/without cues using equipment (OT) Flowsheets (Taken 07/18/2023 1308) LTG: Pt will perform grooming with assistance level of: Set up assist    Problem: RH Bathing Goal: LTG Patient will bathe all body parts with assist levels (OT) Description: LTG: Patient will bathe all body parts with assist levels (OT) Flowsheets (Taken 07/18/2023 1308) LTG: Pt will perform bathing with assistance level/cueing: Minimal Assistance - Patient > 75%   Problem: RH Dressing Goal: LTG Patient will perform upper body dressing (OT) Description: LTG Patient will perform upper body dressing with assist, with/without cues (OT). Flowsheets (Taken 07/18/2023 1308) LTG: Pt will perform upper body dressing with assistance level of: Supervision/Verbal cueing Goal: LTG Patient will perform lower body dressing w/assist (OT) Description: LTG: Patient will perform lower body dressing with assist, with/without cues in positioning using equipment (OT) Flowsheets (Taken 07/18/2023 1308) LTG: Pt will perform lower body dressing with assistance level of: Moderate Assistance - Patient 50 - 74%   Problem: RH Toileting Goal: LTG Patient will perform toileting task (3/3 steps) with assistance level (OT) Description: LTG: Patient will perform toileting task (3/3 steps) with assistance level (OT)  Flowsheets (Taken 07/18/2023 1308) LTG: Pt will perform toileting task (3/3 steps) with assistance level: Moderate Assistance - Patient 50 - 74%   Problem: RH Toilet  Transfers Goal: LTG Patient will perform toilet transfers w/assist (OT) Description: LTG: Patient will perform toilet transfers with assist, with/without cues using equipment (OT) Flowsheets (Taken 07/18/2023 1308) LTG: Pt will perform toilet transfers with assistance level of: Moderate Assistance - Patient 50 - 74%   Problem: RH Tub/Shower Transfers Goal: LTG Patient will perform tub/shower transfers w/assist (OT) Description: LTG: Patient will perform tub/shower transfers with assist, with/without cues using equipment (OT) Flowsheets (Taken 07/18/2023 1308) LTG: Pt will perform tub/shower stall transfers with assistance level of: Moderate Assistance - Patient 50 - 74%

## 2023-07-18 NOTE — Plan of Care (Signed)
  Problem: Consults Goal: RH SPINAL CORD INJURY PATIENT EDUCATION Description:  See Patient Education module for education specifics.  Outcome: Progressing   Problem: SCI BOWEL ELIMINATION Goal: RH STG MANAGE BOWEL WITH ASSISTANCE Description: STG Manage Bowel with medication with min Assistance. Outcome: Progressing Goal: RH STG SCI MANAGE BOWEL WITH MEDICATION WITH ASSISTANCE Description: STG SCI Manage bowel with medication with assistance. Outcome: Progressing   Problem: SCI BLADDER ELIMINATION Goal: RH STG MANAGE BLADDER WITH ASSISTANCE Description: STG Manage Bladder medications with minimal Assistance Outcome: Progressing Goal: RH STG MANAGE BLADDER WITH MEDICATION WITH ASSISTANCE Description: STG Manage Bladder With Medication With Assistance. Outcome: Progressing   Problem: RH SAFETY Goal: RH STG ADHERE TO SAFETY PRECAUTIONS W/ASSISTANCE/DEVICE Description: STG Adhere to Safety Precautions With min Assistance/Device. Outcome: Progressing   Problem: RH PAIN MANAGEMENT Goal: RH STG PAIN MANAGED AT OR BELOW PT'S PAIN GOAL Description: <4 with prns Outcome: Progressing   Problem: RH KNOWLEDGE DEFICIT SCI Goal: RH STG INCREASE KNOWLEDGE OF SELF CARE AFTER SCI Outcome: Progressing

## 2023-07-18 NOTE — Progress Notes (Signed)
Bowel program completed by wife, no assistance needed. Reported large BM.

## 2023-07-18 NOTE — Progress Notes (Signed)
BLE venous duplex has been completed.    Results can be found under chart review under CV PROC. 07/18/2023 12:09 PM Christan Ciccarelli RVT, RDMS

## 2023-07-18 NOTE — Progress Notes (Signed)
PROGRESS NOTE   Subjective/Complaints:   Pt reports movement of LE's improved, however feels like spasticity is still just as bad as before posterior fusion.   Only has to wear cervical collar if wants to- prn.   Having bad spasms again this AM.   Still plans on bowel program with mini enema at night and cathing ~ q4 hours-   Wants IV out.   ROS:  Pt denies SOB, abd pain, CP, N/V/C/D, and vision changes  Negative otherwise, except HPI  Objective:   No results found. Recent Labs    07/18/23 0518  WBC 7.6  HGB 11.4*  HCT 33.1*  PLT 351   Recent Labs    07/18/23 0518  NA 136  K 3.6  CL 100  CO2 27  GLUCOSE 95  BUN 7  CREATININE 0.67  CALCIUM 9.3    Intake/Output Summary (Last 24 hours) at 07/18/2023 1012 Last data filed at 07/18/2023 2956 Gross per 24 hour  Intake --  Output 2350 ml  Net -2350 ml        Physical Exam: Vital Signs Blood pressure 123/82, pulse (!) 110, temperature 98.2 F (36.8 C), temperature source Oral, resp. rate 19, height 5\' 9"  (1.753 m), weight 89.9 kg, SpO2 100%.   General: awake, alert, appropriate, fiance' at bedside;  NAD HENT: conjugate gaze; oropharynx moist CV: regular rhythm, tachycardic rate; no JVD Pulmonary: CTA B/L; no W/R/R- good air movement GI: soft, NT, ND, (+)BS Psychiatric: appropriate- is more interactive than usual, but still extremely flat Neurological: Ox3 Full body spasms seen x 2 in the time I'm room.     MOTOR: RUE: 5/5 Deltoid, 5/5 Biceps, 5/5 Triceps,5/5 Grip LUE: 5/5 Deltoid, 5/5 Biceps, 5/5 Triceps, 5/5 Grip RLE: HF 2+/5, KE 3/5, ADF 4/5, APF 4/5 LLE: HF 2/5, KE 2/5, ADF 2/5, APF 2/5- difficult to test due to increased tone   Severe hypertonia b/l LE left greater than right   SENSORY: Altered sensation below T4 level     Assessment/Plan: 1. Functional deficits which require 3+ hours per day of interdisciplinary therapy in a  comprehensive inpatient rehab setting. Physiatrist is providing close team supervision and 24 hour management of active medical problems listed below. Physiatrist and rehab team continue to assess barriers to discharge/monitor patient progress toward functional and medical goals  Care Tool:  Bathing              Bathing assist       Upper Body Dressing/Undressing Upper body dressing        Upper body assist      Lower Body Dressing/Undressing Lower body dressing            Lower body assist       Toileting Toileting    Toileting assist       Transfers Chair/bed transfer  Transfers assist           Locomotion Ambulation   Ambulation assist              Walk 10 feet activity   Assist           Walk 50 feet activity   Assist  Walk 150 feet activity   Assist           Walk 10 feet on uneven surface  activity   Assist           Wheelchair     Assist               Wheelchair 50 feet with 2 turns activity    Assist            Wheelchair 150 feet activity     Assist          Blood pressure 123/82, pulse (!) 110, temperature 98.2 F (36.8 C), temperature source Oral, resp. rate 19, height 5\' 9"  (1.753 m), weight 89.9 kg, SpO2 100%.  Medical Problem List and Plan: 1. Functional deficits secondary to Cervical myelopathy s/p ACDF and PCDF C6-T1 1/17 by Dr. Jake Samples             -patient may shower, please cover incision             -ELOS/Goals: 14-16, Sup to min A             -Admit to CIR   First day of evaluations- con't CIR PT and OT 2.  Antithrombotics: -DVT/anticoagulation:  Pharmaceutical: Lovenox             -antiplatelet therapy: N/A 3. Pain Management:  Tylenol 650 mg qid. Continue MS contin  15mg  w/supper and oxycodone 5mg  prn  1/23- pain mainly from spasms, but also now has new neck pain from posterior fusion- con't regimen 4. Mood/Behavior/Sleep: LCSW to follow for  evaluation and support.              --was on Seroquel for sleep- restart              -Restart cymbalta at 30mg  daily             -antipsychotic agents: Seroquel for confusion- on 25-50 mg at bedtime- was on 75 mg at bedtime bfore, will see how does.  5. Neuropsych/cognition: This patient is intermittently  capable of making decisions on his own behalf. 6. Skin/Wound Care: Routine pressure relief measures.              --surgical dressing to stay in place with office follow up per d/c summary.  7. Fluids/Electrolytes/Nutrition: Monitor I/O. Check CMET in am             -Continue vitamin C, prosource 8. Spasticity: Continue Valium 5 mg qid, baclofen 20 mg qid, Zanaflex 4 mg every 6 hours prn.    1/23- spasms still a major issue- will monitor for other options. Baclofen has been decreased from prior dose of 30 mg q6 hours- will see if need to increase again.  9. Neurogenic bladder: Cath every 4 hours to keep bladder volumes < 350 cc. Continue flomax 0.4mg              --1/23-con't cathing, no matter cath volumes 5x/day 10. Neurogenic bowel: Was not on bowel program. Last BM documented on 01/18 --Will change senna to am, continue miralax in am.  --Schedule mini enema at nights.  -Sorbitol scheduled tomorrow    1/23- hasn't had good BM's on acute, so will do sorbitol today.    I spent a total of  36   minutes on total care today- >50% coordination of care- due to  D/w OT as well as patient and fiance' to determine changes since last here- also reviewed labs, vitals and orders  to make changes.       LOS: 1 days A FACE TO FACE EVALUATION WAS PERFORMED  Leon Rodriguez 07/18/2023, 10:12 AM

## 2023-07-18 NOTE — Progress Notes (Signed)
Inpatient Rehabilitation  Patient information reviewed and entered into eRehab system by Demarrio Menges Treg Diemer, OTR/L, Rehab Quality Coordinator.   Information including medical coding, functional ability and quality indicators will be reviewed and updated through discharge.   

## 2023-07-19 MED ORDER — BACLOFEN 10 MG PO TABS
30.0000 mg | ORAL_TABLET | Freq: Four times a day (QID) | ORAL | Status: DC
  Administered 2023-07-19 – 2023-08-01 (×53): 30 mg via ORAL
  Filled 2023-07-19 (×53): qty 3

## 2023-07-19 NOTE — Plan of Care (Signed)
  Problem: SCI BOWEL ELIMINATION Goal: RH STG SCI MANAGE BOWEL WITH MEDICATION WITH ASSISTANCE Description: STG SCI Manage bowel with medication with assistance. Outcome: Progressing Note: Able to perform enema without nurse assistance, progressed   Problem: SCI BLADDER ELIMINATION Goal: RH STG MANAGE BLADDER WITH MEDICATION WITH ASSISTANCE Description: STG Manage Bladder With Medication With Assistance. Outcome: Progressing   Problem: RH SAFETY Goal: RH STG ADHERE TO SAFETY PRECAUTIONS W/ASSISTANCE/DEVICE Description: STG Adhere to Safety Precautions With min Assistance/Device. Outcome: Progressing   Problem: RH KNOWLEDGE DEFICIT SCI Goal: RH STG INCREASE KNOWLEDGE OF SELF CARE AFTER SCI Outcome: Progressing Note: Both family and pt fully progressing in their knowledge of the self care needed after SCI

## 2023-07-19 NOTE — Progress Notes (Signed)
PROGRESS NOTE   Subjective/Complaints:   Pt reports needs his pain meds this AM. After review of chart, saw Baclofen had been decreased for spasticity- to 20 mg q6 hours- d/w pt/fiance- will increase baclofen to 30 mg q6 hours- so can compare if spasticity better after surgery- right now cannot since dose lower.   Said last night slept well, except once woken up by spasms- painful.  Slept like baby according to fiance;   Had large BM with bowel program Cathing going well  ROS:  Pt denies SOB, abd pain, CP, N/V/C/D, and vision changes   Negative otherwise, except HPI  Objective:   VAS [place] LOWER EXTREMITY VENOUS (DVT) Result Date: 07/18/2023  Lower Venous DVT Study Patient Name:  Leon Rodriguez  Date of Exam:   07/18/2023 Medical Rec #: [id number]         Accession #:    [id number] Date of Birth: 10-22-1965         Patient Gender: M Patient Age:   58 years Exam Location:  [facility] Procedure:      VAS [place] LOWER EXTREMITY VENOUS (DVT) Referring Phys: PAMELA LOVE --------------------------------------------------------------------------------  Indications: Paraplegia.  Risk Factors: Immobility. Limitations: Study limited due to continuous muscle spasms, poor positioning, poor ultrasound/tissue interface. Comparison Study: Previous exam on 06/07/2023 was negative for DVT Performing Technologist: Ernestene Mention RVT, RDMS  Examination Guidelines: A complete evaluation includes B-mode imaging, spectral Doppler, color Doppler, and power Doppler as needed of all accessible portions of each vessel. Bilateral testing is considered an integral part of a complete examination. Limited examinations for reoccurring indications may be performed as noted. The reflux portion of the exam is performed with the patient in reverse Trendelenburg.  +--------+---------------+---------+-----------+----------+--------------------+ RIGHT    CompressibilityPhasicitySpontaneityPropertiesThrombus Aging       +--------+---------------+---------+-----------+----------+--------------------+ CFV     Full           Yes      Yes                                       +--------+---------------+---------+-----------+----------+--------------------+ SFJ     Full                                                              +--------+---------------+---------+-----------+----------+--------------------+ FV Prox Full           Yes      Yes                                       +--------+---------------+---------+-----------+----------+--------------------+ FV Mid  Full           Yes      Yes                                       +--------+---------------+---------+-----------+----------+--------------------+  FV      Full           Yes      Yes                                       Distal                                                                    +--------+---------------+---------+-----------+----------+--------------------+ PFV                    Yes      Yes                  patent by                                                                 color/doppler        +--------+---------------+---------+-----------+----------+--------------------+ POP     Full           Yes      Yes                                       +--------+---------------+---------+-----------+----------+--------------------+ PTV     Full                                                              +--------+---------------+---------+-----------+----------+--------------------+ PERO    Full                                                              +--------+---------------+---------+-----------+----------+--------------------+   +---------+---------------+---------+-----------+----------+-------------------+ LEFT     CompressibilityPhasicitySpontaneityPropertiesThrombus Aging       +---------+---------------+---------+-----------+----------+-------------------+ CFV      Full           Yes      Yes                                      +---------+---------------+---------+-----------+----------+-------------------+ SFJ      Full                                                             +---------+---------------+---------+-----------+----------+-------------------+ FV Prox  Full  Yes      Yes                                      +---------+---------------+---------+-----------+----------+-------------------+ FV Mid   Full           Yes      Yes                  limited compression +---------+---------------+---------+-----------+----------+-------------------+ FV DistalFull           Yes      Yes                  limited compression +---------+---------------+---------+-----------+----------+-------------------+ PFV      Full                                                             +---------+---------------+---------+-----------+----------+-------------------+ POP      Full           Yes      Yes                                      +---------+---------------+---------+-----------+----------+-------------------+ PTV      Full                                                             +---------+---------------+---------+-----------+----------+-------------------+ PERO     Full                                                             +---------+---------------+---------+-----------+----------+-------------------+     Summary: BILATERAL: - No evidence of deep vein thrombosis seen in the lower extremities, bilaterally. -No evidence of popliteal cyst, bilaterally.   *See table(s) above for measurements and observations. Electronically signed by Sherald Hess MD on 07/18/2023 at 1:24:05 PM.    Final    Recent Labs    07/18/23 0518  WBC 7.6  HGB 11.4*  HCT 33.1*  PLT 351   Recent Labs    07/18/23 0518  NA  136  K 3.6  CL 100  CO2 27  GLUCOSE 95  BUN 7  CREATININE 0.67  CALCIUM 9.3    Intake/Output Summary (Last 24 hours) at 07/19/2023 0807 Last data filed at 07/18/2023 2330 Gross per 24 hour  Intake 480 ml  Output 1050 ml  Net -570 ml        Physical Exam: Vital Signs Blood pressure 94/67, pulse 95, temperature 98.7 F (37.1 C), temperature source Oral, resp. rate 18, height 5\' 9"  (1.753 m), weight 89.9 kg, SpO2 100%.    General: awake, alert, appropriate, sitting up in bed; fiance' at bedside as well as nurse and nursing coordinator; NAD HENT: conjugate gaze; oropharynx moist CV: regular rate and rhythm; no JVD  Pulmonary: CTA B/L; no W/R/R- good air movement GI: soft, NT, ND, (+)BS- normoactive Psychiatric: appropriate- more interactive, less sleepy Neurological: Ox3 Spasms seen, however not quite as bad this AM- few beats clonus B/L    MOTOR: RUE: 5/5 Deltoid, 5/5 Biceps, 5/5 Triceps,5/5 Grip LUE: 5/5 Deltoid, 5/5 Biceps, 5/5 Triceps, 5/5 Grip RLE: HF 2+/5, KE 3/5, ADF 4/5, APF 4/5 LLE: HF 2/5, KE 2/5, ADF 2/5, APF 2/5- difficult to test due to increased tone   Severe hypertonia b/l LE left greater than right   SENSORY: Altered sensation below T4 level     Assessment/Plan: 1. Functional deficits which require 3+ hours per day of interdisciplinary therapy in a comprehensive inpatient rehab setting. Physiatrist is providing close team supervision and 24 hour management of active medical problems listed below. Physiatrist and rehab team continue to assess barriers to discharge/monitor patient progress toward functional and medical goals  Care Tool:  Bathing    Body parts bathed by patient: Right arm, Left arm, Chest, Abdomen, Front perineal area, Face   Body parts bathed by helper: Buttocks, Right upper leg, Right lower leg, Left lower leg, Left upper leg     Bathing assist Assist Level: Maximal Assistance - Patient 24 - 49%     Upper Body  Dressing/Undressing Upper body dressing   What is the patient wearing?: Pull over shirt    Upper body assist Assist Level: Moderate Assistance - Patient 50 - 74%    Lower Body Dressing/Undressing Lower body dressing      What is the patient wearing?: Pants, Incontinence brief     Lower body assist Assist for lower body dressing: Dependent - Patient 0%     Toileting Toileting    Toileting assist Assist for toileting: 2 Helpers     Transfers Chair/bed transfer  Transfers assist  Chair/bed transfer activity did not occur: Safety/medical concerns  Chair/bed transfer assist level: 2 Helpers     Locomotion Ambulation   Ambulation assist   Ambulation activity did not occur: Safety/medical concerns (spasticity; weakness)          Walk 10 feet activity   Assist  Walk 10 feet activity did not occur: Safety/medical concerns        Walk 50 feet activity   Assist Walk 50 feet with 2 turns activity did not occur: Safety/medical concerns         Walk 150 feet activity   Assist Walk 150 feet activity did not occur: Safety/medical concerns         Walk 10 feet on uneven surface  activity   Assist Walk 10 feet on uneven surfaces activity did not occur: Safety/medical concerns         Wheelchair     Assist Is the patient using a wheelchair?: Yes Type of Wheelchair: Power Wheelchair activity did not occur: Safety/medical concerns (w/c malfunction)         Wheelchair 50 feet with 2 turns activity    Assist    Wheelchair 50 feet with 2 turns activity did not occur: Safety/medical concerns       Wheelchair 150 feet activity     Assist  Wheelchair 150 feet activity did not occur: Safety/medical concerns       Blood pressure 94/67, pulse 95, temperature 98.7 F (37.1 C), temperature source Oral, resp. rate 18, height 5\' 9"  (1.753 m), weight 89.9 kg, SpO2 100%.  Medical Problem List and Plan: 1. Functional deficits  secondary to Cervical myelopathy s/p ACDF and PCDF  C6-T1 1/17 by Dr. Jake Samples             -patient may shower, please cover incision             -ELOS/Goals: 14-16, Sup to min A             -Admit to CIR   Con't CIR PT and OT 2.  Antithrombotics: -DVT/anticoagulation:  Pharmaceutical: Lovenox             -antiplatelet therapy: N/A 3. Pain Management:  Tylenol 650 mg qid. Continue MS contin  15mg  w/supper and oxycodone 5mg  prn  1/23- pain mainly from spasms, but also now has new neck pain from posterior fusion- con't regimen 4. Mood/Behavior/Sleep: LCSW to follow for evaluation and support.              --was on Seroquel for sleep- restart              -Restart cymbalta at 30mg  daily             -antipsychotic agents: Seroquel for confusion- on 25-50 mg at bedtime- was on 75 mg at bedtime bfore, will see how does.  5. Neuropsych/cognition: This patient is intermittently  capable of making decisions on his own behalf. 6. Skin/Wound Care: Routine pressure relief measures.              --surgical dressing to stay in place with office follow up per d/c summary.  7. Fluids/Electrolytes/Nutrition: Monitor I/O. Check CMET in am             -Continue vitamin C, prosource 8. Spasticity: Continue Valium 5 mg qid, baclofen 20 mg qid, Zanaflex 4 mg every 6 hours prn.    1/23- spasms still a major issue- will monitor for other options. Baclofen has been decreased from prior dose of 30 mg q6 hours- will see if need to increase again. 1/24- d/w pt/fiance- will increase Baclofen to 30 mg q6 hours.   9. Neurogenic bladder: Cath every 4 hours to keep bladder volumes < 350 cc. Continue flomax 0.4mg              --1/23-con't cathing, no matter cath volumes 5x/day 10. Neurogenic bowel: Was not on bowel program. Last BM documented on 01/18 --Will change senna to am, continue miralax in am.  --Schedule mini enema at nights.  -Sorbitol scheduled tomorrow    1/23- hasn't had good BM's on acute, so will do sorbitol  today.   1/24- had good BM last night with bowel program   I spent a total of 35   minutes on total care today- >50% coordination of care- due to  D/w pt, nursing and nursing coordinator as well as fiance' about spasticity- we decided to increase baclofen back to prior dose and compare/monitor   LOS: 2 days A FACE TO FACE EVALUATION WAS PERFORMED  Leon Rodriguez 07/19/2023, 8:07 AM

## 2023-07-19 NOTE — Progress Notes (Signed)
Patient ID: Leon Rodriguez, male   DOB: [birth year], 58 y.o.   MRN: [id number]  SW submitted CAP/DA referral with [facility].   [name], MSW, LCSW Office: [phone] Cell: [phone] Fax: [phone]

## 2023-07-19 NOTE — Progress Notes (Signed)
Occupational Therapy Session Note  Patient Details  Name: Leon Rodriguez MRN: [id number] Date of Birth: 1965/09/22  Today's Date: 07/19/2023 OT Individual Time: 0930-1040 OT Individual Time Calculation (min): 70 min    Short Term Goals: Week 1:  OT Short Term Goal 1 (Week 1): Pt will complete bed mobility Mod A in preparation for LB ADLs OT Short Term Goal 2 (Week 1): Pt will complete LB dressing Max A OT Short Term Goal 3 (Week 1): Pt will complete UB dressing SBA  Skilled Therapeutic Interventions/Progress Updates:    OT intervention with focus on w/c positioning, PWC mobility, and discharge planning. Rolled towels placed in w/c to assist with correct positioning of BLE in PWC and assist with spasms relief. Discussed home setup and PWC accessibility. Pt states he has 30" doors. Pt preformed pressure relief appropriately during therapy session. PWC mobility in simulated community envirionment. Min verbal cues for awareness in crowded envirionment. Pt returned to room and remained in PWC. SO present.   Therapy Documentation Precautions:  Precautions Precautions: Fall, Cervical Precaution Comments: severe muscle spasms, hard collar for comfort Required Braces or Orthoses: Cervical Brace Cervical Brace: Hard collar Restrictions Weight Bearing Restrictions Per Provider Order: No Other Position/Activity Restrictions: per [name] PA, Pt can wear cervical brace PRN as on 1/20   Pain: Pt with ongoing BLE and trunk spasms rated at 9/10; meds admin prior to therapy and stretching/gentle massage   Therapy/Group: Individual Therapy  Rich Brave 07/19/2023, 10:42 AM

## 2023-07-19 NOTE — Progress Notes (Signed)
Physical Therapy Session Note  Patient Details  Name: Leon Rodriguez MRN: [id number] Date of Birth: [birth year]  Today's Date: 07/19/2023 PT Individual Time: 6578-4696 PT Individual Time Calculation (min): 70 min   Short Term Goals: Week 1:  PT Short Term Goal 1 (Week 1): Pt will require max A with rolling in bilateral directions PT Short Term Goal 2 (Week 1): Pt will direct caregiver with dependent transfer set-up PT Short Term Goal 3 (Week 1): Pt will require min A with power wheelchair mobility 50 ft  Skilled Therapeutic Interventions/Progress Updates: Ptbut  presented in bed with SO present agreeable to therapy. Pt states no pain at start of session intermittent neck pain and pain with spasms during session. Pt agreeable to OOB mobility. Completed supine to sit with modA and use of bed features. Pt required most assist for BLE management and was able to perform upper body without assist. PTA donned shoes total A and set up Slide board total A. Pt required maxA for Slide board transfer with increased support due to spasms. In [facility] PTA set up bolsters at feet and knees. Pt navigated to day room with supervision and completed Slide board transfer to mat in same manner as prior. On mat pt participated in seated balance activities including several rounds of horseshoes incorporating moderate reaches outsides BOS. Pt was able to use UE to provide support while demonstrating safety with moderate reaches. Pt also participated in seated UE therex including chest press with 4lb dowel to fatigue and rows/scap retraction with 4lb dowel and use of red theraband to fatigue. Pt then indicated possible BM (at minimum flatulence) and pt requesting to return to room. Pt set up total A and completed Slide board transfer to [name] with maxA x 1. Pt navigated back to room with supervision and was able to successfully align with bed. Pt transferred back to bed with maxA and completed sit to supine with maxA. Pt left in bed  at end of session with bed alarm on, call bell within reach and nsg staff notified of pt's disposition.       Therapy Documentation Precautions:  Precautions Precautions: Fall, Cervical Precaution Comments: severe muscle spasms, hard collar for comfort Required Braces or Orthoses: Cervical Brace Cervical Brace: Hard collar Restrictions Weight Bearing Restrictions Per Provider Order: No Other Position/Activity Restrictions: per [name] PA, Pt can wear cervical brace PRN as on 1/20 General:   Vital Signs: Therapy Vitals Temp: 98 F (36.7 C) Temp Source: Oral Resp: 18 BP: 120/79 Patient Position (if appropriate): Lying Oxygen Therapy SpO2: 99 % O2 Device: Room Air Pain: Pain Assessment Pain Scale: 0-10 Pain Score: 8  Mobility:   Locomotion :    Trunk/Postural Assessment :    Balance:   Exercises:   Other Treatments:      Therapy/Group: Individual Therapy  Margurette Brener 07/19/2023, 4:00 PM

## 2023-07-19 NOTE — Progress Notes (Signed)
Physical Therapy Session Note  Patient Details  Name: Leon Rodriguez MRN: [id number] Date of Birth: [birth year]  Today's Date: 07/19/2023 PT Individual Time: 8413-2440 PT Individual Time Calculation (min): 39 min   Short Term Goals: Week 1:  PT Short Term Goal 1 (Week 1): Pt will require max A with rolling in bilateral directions PT Short Term Goal 2 (Week 1): Pt will direct caregiver with dependent transfer set-up PT Short Term Goal 3 (Week 1): Pt will require min A with power wheelchair mobility 50 ft  Skilled Therapeutic Interventions/Progress Updates:    Pt recd in bed, reports unrated back/neck pain. Nsg present for meds pass and provided pain medication. Note continued spasms, L>R. Pt requesting to change brief (dry, just requesting clean one). Education focused on care direction and independence with dressing. Pt able to manage front of brief/fasteners with cueing. Assist for placing brief while rolling min a (LE management and VC). Assist to thread pants and pull to knees, then pt able to pull over hips with min a and cueing. Bed mobility with light min a to clear flexed LLE off EOB and SBA for balance. slideboard transfer with mod a to PWC. Pt demoes improving ability to clear board and was able to place/remove with cueing and min a. Discussed pressure relief timing and frequency (2 min every 15 min) and demoed in PWC. Pt able to return demo. Pt remained seated in PWC with his SO present and needs in reach.   Therapy Documentation Precautions:  Precautions Precautions: Fall, Cervical Precaution Comments: severe muscle spasms, hard collar for comfort Required Braces or Orthoses: Cervical Brace Cervical Brace: Hard collar Restrictions Weight Bearing Restrictions Per Provider Order: No Other Position/Activity Restrictions: per [name] PA, Pt can wear cervical brace PRN as on 1/20 General:        Therapy/Group: Individual Therapy  Juluis Rainier 07/19/2023, 10:06 AM

## 2023-07-19 NOTE — Progress Notes (Signed)
Pt due to have in/out cath at 5:15pm  pt refused, wants to wait for spouse to return to help administer in/out and bowel program,  Requested again at 6:15 pm to attempt in/out cath and bowel program, pt still refused, understands the need for the bladder and bowel program to be on schedule,

## 2023-07-19 NOTE — Progress Notes (Signed)
Met patient and wife to discuss about rehab schedule, educational materials and plan of care. Discussed patient about his pain .Per patient sleep is affected due to pain. MD in room and adjusted his medications. Discussed about his cath schedule and bowel program. Wife is very much involved. Incision clean, dry and intact. MD checked it as well.Continue to to follow along to discharge to address educational needs.

## 2023-07-19 NOTE — Care Management (Signed)
Inpatient Rehabilitation Center Individual Statement of Services  Patient Name:  Leon Rodriguez  Date:  07/19/2023  Welcome to the Inpatient Rehabilitation Center.  Our goal is to provide you with an individualized program based on your diagnosis and situation, designed to meet your specific needs.  With this comprehensive rehabilitation program, you will be expected to participate in at least 3 hours of rehabilitation therapies Monday-Friday, with modified therapy programming on the weekends.  Your rehabilitation program will include the following services:  Physical Therapy (PT), Occupational Therapy (OT), 24 hour per day rehabilitation nursing, Therapeutic Recreaction (TR), Psychology, Neuropsychology, Care Coordinator, Rehabilitation Medicine, Nutrition Services, Pharmacy Services, and Other  Weekly team conferences will be held on Tuesdays to discuss your progress.  Your Inpatient Rehabilitation Care Coordinator will talk with you frequently to get your input and to update you on team discussions.  Team conferences with you and your family in attendance may also be held.  Expected length of stay: 2 weeks    Overall anticipated outcome: Moderate Assistance  Depending on your progress and recovery, your program may change. Your Inpatient Rehabilitation Care Coordinator will coordinate services and will keep you informed of any changes. Your Inpatient Rehabilitation Care Coordinator's name and contact numbers are listed  below.  The following services may also be recommended but are not provided by the Inpatient Rehabilitation Center:  Driving Evaluations Home Health Rehabiltiation Services Outpatient Rehabilitation Services Vocational Rehabilitation   Arrangements will be made to provide these services after discharge if needed.  Arrangements include referral to agencies that provide these services.  Your insurance has been verified to be:  [place] [employer]  Your primary  doctor is:  Jessica Priest  Pertinent information will be shared with your doctor and your insurance company.  Inpatient Rehabilitation Care Coordinator:  Susie Cassette [phone] or [phone]  Information discussed with and copy given to patient by: Gretchen Short, 07/19/2023, 10:54 AM

## 2023-07-20 MED ORDER — SORBITOL 70 % SOLN
30.0000 mL | Freq: Once | Status: AC
  Administered 2023-07-20: 30 mL via ORAL
  Filled 2023-07-20: qty 30

## 2023-07-20 MED ORDER — SENNOSIDES-DOCUSATE SODIUM 8.6-50 MG PO TABS
3.0000 | ORAL_TABLET | Freq: Every day | ORAL | Status: DC
  Administered 2023-07-21 – 2023-08-01 (×12): 3 via ORAL
  Filled 2023-07-20 (×12): qty 3

## 2023-07-20 NOTE — Progress Notes (Signed)
PROGRESS NOTE   Subjective/Complaints:   Pt sleepy again this AM- slept well last night per fiance'.  Drowsy with PT as well.  More movement, but so sleepy today.  Appetite so-so- not back to prior.  No bowel results last night.   ROS:  Limited by sleepiness Negative otherwise, except HPI  Objective:   No results found.  Recent Labs    07/18/23 0518  WBC 7.6  HGB 11.4*  HCT 33.1*  PLT 351   Recent Labs    07/18/23 0518  NA 136  K 3.6  CL 100  CO2 27  GLUCOSE 95  BUN 7  CREATININE 0.67  CALCIUM 9.3    Intake/Output Summary (Last 24 hours) at 07/20/2023 1515 Last data filed at 07/20/2023 0900 Gross per 24 hour  Intake 240 ml  Output 1150 ml  Net -910 ml        Physical Exam: Vital Signs Blood pressure 101/70, pulse 98, temperature 97.8 F (36.6 C), resp. rate 16, height 5\' 9"  (1.753 m), weight 89.9 kg, SpO2 96%.     General: awake, briefly, but mainly asleep- fiance at bedside; NAD HENT: conjugate gaze; oropharynx moist CV: regular rate and rhythm; no JVD Pulmonary: CTA B/L; no W/R/R- good air movement GI: soft, NT, ND, (+)BS- hypoactive Psychiatric: sleepy Neurological: asleep- didn't want to wake up- but did briefly MOTOR: RUE: 5/5 Deltoid, 5/5 Biceps, 5/5 Triceps,5/5 Grip LUE: 5/5 Deltoid, 5/5 Biceps, 5/5 Triceps, 5/5 Grip RLE: HF 2+/5, KE 3/5, ADF 4/5, APF 4/5 LLE: HF 2/5, KE 2/5, ADF 2/5, APF 2/5- difficult to test due to increased tone   Severe hypertonia b/l LE left greater than right   SENSORY: Altered sensation below T4 level     Assessment/Plan: 1. Functional deficits which require 3+ hours per day of interdisciplinary therapy in a comprehensive inpatient rehab setting. Physiatrist is providing close team supervision and 24 hour management of active medical problems listed below. Physiatrist and rehab team continue to assess barriers to discharge/monitor patient progress  toward functional and medical goals  Care Tool:  Bathing    Body parts bathed by patient: Right arm, Left arm, Chest, Abdomen, Front perineal area, Face   Body parts bathed by helper: Buttocks, Right upper leg, Right lower leg, Left lower leg, Left upper leg     Bathing assist Assist Level: Maximal Assistance - Patient 24 - 49%     Upper Body Dressing/Undressing Upper body dressing   What is the patient wearing?: Pull over shirt    Upper body assist Assist Level: Moderate Assistance - Patient 50 - 74%    Lower Body Dressing/Undressing Lower body dressing      What is the patient wearing?: Pants, Incontinence brief     Lower body assist Assist for lower body dressing: Dependent - Patient 0%     Toileting Toileting    Toileting assist Assist for toileting: 2 Helpers     Transfers Chair/bed transfer  Transfers assist  Chair/bed transfer activity did not occur: Safety/medical concerns  Chair/bed transfer assist level: 2 Helpers     Locomotion Ambulation   Ambulation assist   Ambulation activity did not occur: Safety/medical concerns (spasticity;  weakness)          Walk 10 feet activity   Assist  Walk 10 feet activity did not occur: Safety/medical concerns        Walk 50 feet activity   Assist Walk 50 feet with 2 turns activity did not occur: Safety/medical concerns         Walk 150 feet activity   Assist Walk 150 feet activity did not occur: Safety/medical concerns         Walk 10 feet on uneven surface  activity   Assist Walk 10 feet on uneven surfaces activity did not occur: Safety/medical concerns         Wheelchair     Assist Is the patient using a wheelchair?: Yes Type of Wheelchair: Power Wheelchair activity did not occur: Safety/medical concerns (w/c malfunction)         Wheelchair 50 feet with 2 turns activity    Assist    Wheelchair 50 feet with 2 turns activity did not occur: Safety/medical  concerns       Wheelchair 150 feet activity     Assist  Wheelchair 150 feet activity did not occur: Safety/medical concerns       Blood pressure 101/70, pulse 98, temperature 97.8 F (36.6 C), resp. rate 16, height 5\' 9"  (1.753 m), weight 89.9 kg, SpO2 96%.  Medical Problem List and Plan: 1. Functional deficits secondary to Cervical myelopathy s/p ACDF and PCDF C6-T1 1/17 by Dr. Jake Samples             -patient may shower, please cover incision             -ELOS/Goals: 14-16, Sup to min A             -Admit to CIR   Con't CIR PT and OT  Using hard collar as needed  Limited by spasticity and lethargy from meds.  2.  Antithrombotics: -DVT/anticoagulation:  Pharmaceutical: Lovenox             -antiplatelet therapy: N/A 3. Pain Management:  Tylenol 650 mg qid. Continue MS contin  15mg  w/supper and oxycodone 5mg  prn  1/23- pain mainly from spasms, but also now has new neck pain from posterior fusion- con't regimen 4. Mood/Behavior/Sleep: LCSW to follow for evaluation and support.              --was on Seroquel for sleep- restart              -Restart cymbalta at 30mg  daily             -antipsychotic agents: Seroquel for confusion- on 25-50 mg at bedtime- was on 75 mg at bedtime bfore, will see how does.   1/25- more sleepy this AM- slept well- but could be meds again- will see if can reduce Valium long term 5. Neuropsych/cognition: This patient is intermittently  capable of making decisions on his own behalf. 6. Skin/Wound Care: Routine pressure relief measures.              --surgical dressing to stay in place with office follow up per d/c summary.  7. Fluids/Electrolytes/Nutrition: Monitor I/O. Check CMET in am             -Continue vitamin C, prosource 8. Spasticity: Continue Valium 5 mg qid, baclofen 20 mg qid, Zanaflex 4 mg every 6 hours prn.    1/23- spasms still a major issue- will monitor for other options. Baclofen has been decreased from prior dose of 30 mg  q6 hours- will  see if need to increase again. 1/24- d/w pt/fiance- will increase Baclofen to 30 mg q6 hours. 1/25- will add dantrolene back tomorrow-if tolerates and spasticity improves, then maybe can reduce Valium.    9. Neurogenic bladder: Cath every 4 hours to keep bladder volumes < 350 cc. Continue flomax 0.4mg              --1/23-1/25con't cathing, no matter cath volumes 5x/day 10. Neurogenic bowel: Was not on bowel program. Last BM documented on 01/18 --Will change senna to am, continue miralax in am.  --Schedule mini enema at nights.  -Sorbitol scheduled tomorrow    1/23- hasn't had good BM's on acute, so will do sorbitol today.   1/24- had good BM last night with bowel program  1/25- didn't have any results last night- will give sorbitol to get results tonight and increase Senna to 3 tabs daily- already on Miralax daily.     I spent a total of 37   minutes on total care today- >50% coordination of care- due to  D/w nursing and fiance' about sedation as well as lack of BM- also did IPOC-  LOS: 3 days A FACE TO FACE EVALUATION WAS PERFORMED  Illene Sweeting 07/20/2023, 3:15 PM

## 2023-07-20 NOTE — Progress Notes (Signed)
Physical Therapy Session Note  Patient Details  Name: Leon Rodriguez MRN: [id number] Date of Birth: 08/15/1965  Today's Date: 07/20/2023 PT Individual Time: 0805-0920, 1300-1400 PT Individual Time Calculation (min): 75 min, 60 min   Short Term Goals: Week 1:  PT Short Term Goal 1 (Week 1): Pt will require max A with rolling in bilateral directions PT Short Term Goal 2 (Week 1): Pt will direct caregiver with dependent transfer set-up PT Short Term Goal 3 (Week 1): Pt will require min A with power wheelchair mobility 50 ft  Skilled Therapeutic Interventions/Progress Updates:    Session 1: pt received in bed and agreeable to therapy. Pt reports unrated neck pain, relieved with collar and positioning. Attempted bed mobility to L side of bed, with pt able to do more BLE management, but still requiring some assist for positioning. slideboard transfer to [facility] with mod a overall and +2 standing by. Pt continues to be limited by tone in BLE.   Pt navigated PWC with supervision and VC through halls and gyms, needing occasional cueing for objects on his L side. During session, discussed need for ramp at home, pt states they have plan in place and just need to "pull the trigger", therapist recommended they start that process. Briefly discussed transportation of PWC, including public transportation.  Pt then cued to walk therapist through transfer, teaching all steps. Needed minimal cueing for set up ( remove seat belt) and where to place hands for assist, but was able to teach back transfer and identify major safety items such as board placement. Performed x 2 with same assist level as above.   Pt then instructed in pressure relief, with increased cueing needed as pt suddenly became drowsy and difficulty maintaining wakefulness. Pt navigated back to room, intermittently colliding with obstacles d/t drowsiness. Pt remained in chair in tilted position with plan to rest, needs in reach. Sudden drowsiness  communicated to nursing.   Session 2: pt received in bed and agreeable to therapy. Pt reports unrated neck pain, relieved with collar and positioning. Pt set up with leg loops and was able to navigate BLE management with more success, still requiring min a to complete bed mobility. slideboard transfer with min a and +2 standing by for safety. Pt navigated PWC to [facility] entrance for [facility] navigation in community environment, pt's SO and sister present throughout. Pt navigated with minimal cueing, for navigation into elevator, when reversing, and in tight spaces. Introduced pt to Tour manager function for accessing bar height tables in community environments, tall cabinets, etc. Pt also navigated through gift shop, including picking out and item and making purchase. Discussed that in the future he will be able to get closer to counter by turning laterally when he is able to twist. Pt remained in atrium with his family members, therapist signed pt out on return to unit.   Therapy Documentation Precautions:  Precautions Precautions: Fall, Cervical Precaution Comments: severe muscle spasms, hard collar for comfort Required Braces or Orthoses: Cervical Brace Cervical Brace: Hard collar Restrictions Weight Bearing Restrictions Per Provider Order: No Other Position/Activity Restrictions: per [name] PA, Pt can wear cervical brace PRN as on 1/20 General:      Therapy/Group: Individual Therapy  Juluis Rainier 07/20/2023, 1:00 PM

## 2023-07-20 NOTE — Progress Notes (Signed)
Per wife, patient did not have any output from bowel program.

## 2023-07-20 NOTE — Progress Notes (Signed)
Occupational Therapy Session Note  Patient Details  Name: Leon Rodriguez MRN: [id number] Date of Birth: 1965/11/06  Today's Date: 07/20/2023 OT Individual Time: 9518-8416 OT Individual Time Calculation (min): 55 min    Short Term Goals: Week 1:  OT Short Term Goal 1 (Week 1): Pt will complete bed mobility Mod A in preparation for LB ADLs OT Short Term Goal 2 (Week 1): Pt will complete LB dressing Max A OT Short Term Goal 3 (Week 1): Pt will complete UB dressing SBA  Skilled Therapeutic Interventions/Progress Updates:    Patient at w/c LOF at the time of arrival with family present. The pt reported a pain response of 5 on a 0-10 scale associated with his hips.  The pt was catheterized at 9:55 AM by family at the being of the session. The pt  agreed to completing skilled OT to improve his functional outcome. The pt reported a decrease in muscle spasm secondary to bathing prior to bed time which resulted in him sleeping  through the night. The pt went on to complete UB exercise using 4lb dumb bell for bicep curls 3 sets of 15 with rest breaks as needed, the pt required 1 restbreak. The pt was instructed in LB  dress and was informed that the task would be easier for him to complete at bed LOF.  LB dressing was simulating at w/c LOF using theraband and the reacher, the pt was very tired so we worked on his bed transfers. The pt was able to transfer to bed LOF using the sliding board with ModA x1.  The pt was encouraged to incorporate relaxation breathing to assist with decreasing his tone and improve his tolerance for PROM.  At the end of the session, the call light and bedside table were place within reach and all additional needs were addressed.   Therapy Documentation Precautions:  Precautions Precautions: Fall, Cervical Precaution Comments: severe muscle spasms, hard collar for comfort Required Braces or Orthoses: Cervical Brace Cervical Brace: Hard collar Restrictions Weight Bearing  Restrictions Per Provider Order: No Other Position/Activity Restrictions: per [name] PA, Pt can wear cervical brace PRN as on 1/20 Therapy/Group: Individual Therapy  Lavona Mound 07/20/2023, 12:39 PM

## 2023-07-20 NOTE — IPOC Note (Signed)
Overall Plan of Care [facility]) Patient Details Name: Leon Rodriguez MRN: [id number] DOB: 1966-02-21  Admitting Diagnosis: Cervical myelopathy [facility])  Hospital Problems: Principal Problem:   Cervical myelopathy (HCC)     Functional Problem List: Nursing Bladder, Bowel, Endurance, Medication Management, Pain, Safety  PT Balance, Endurance, Pain, Sensory, Motor, Nutrition, Edema, Safety  OT Balance, Safety, Sensory, Skin Integrity, Endurance, Motor, Pain  SLP    TR         Basic ADL's: OT Eating, Grooming, Bathing, Dressing, Toileting     Advanced  ADL's: OT       Transfers: PT Bed Mobility, Bed to Chair  OT Toilet, Tub/Shower     Locomotion: PT Wheelchair Mobility     Additional Impairments: OT None  SLP        TR      Anticipated Outcomes Item Anticipated Outcome  Self Feeding set-up  Swallowing      Basic self-care  set up UB self care, Mod A LB self care/toileting, Min A bathing  Toileting  Mod A   Bathroom Transfers Mod A  Bowel/Bladder  manage bowel with medications; manage bladder with medications  Transfers  dependent  Locomotion  CGA  Communication     Cognition     Pain  <4 with prns  Safety/Judgment  manage  w cues   Therapy Plan: PT Intensity: Minimum of 1-2 x/day ,45 to 90 minutes PT Frequency: 5 out of 7 days PT Duration Estimated Length of Stay: 2 weeks OT Intensity: Minimum of 1-2 x/day, 45 to 90 minutes OT Frequency: 5 out of 7 days OT Duration/Estimated Length of Stay: 28-30 days     Team Interventions: Nursing Interventions Disease Management/Prevention, Discharge Planning, Pain Management, Bladder Management, Bowel Management, Medication Management  PT interventions Ambulation/gait training, Community reintegration, DME/adaptive equipment instruction, Neuromuscular re-education, Stair training, Psychosocial support, UE/LE Strength taining/ROM, Wheelchair propulsion/positioning, Warden/ranger, Discharge planning,  Functional electrical stimulation, Pain management, Skin care/wound management, Therapeutic Activities, UE/LE Coordination activities, Cognitive remediation/compensation, Disease management/prevention, Splinting/orthotics, Functional mobility training, Patient/family education, Therapeutic Exercise, Visual/perceptual remediation/compensation  OT Interventions DME/adaptive equipment instruction, Therapeutic Activities, UE/LE Strength taining/ROM, Therapeutic Exercise, Self Care/advanced ADL retraining, Warden/ranger, UE/LE Coordination activities, Patient/family education, Psychosocial support, Functional mobility training, Discharge planning, Neuromuscular re-education, Pain management, Cognitive remediation/compensation, Disease mangement/prevention, Community reintegration, Splinting/orthotics, Wheelchair propulsion/positioning, Functional electrical stimulation, Skin care/wound managment  SLP Interventions    TR Interventions    SW/CM Interventions Discharge Planning, Psychosocial Support, Patient/Family Education   Barriers to Discharge MD  Medical stability, Home enviroment access/loayout, Incontinence, Neurogenic bowel and bladder, Wound care, Weight bearing restrictions, and spasticity and sedation  Nursing Decreased caregiver support, Home environment access/layout 1  Entrance Stairs-Rails: Two level, Bed/bath upstairs  Alternate Level Stairs  PT Home environment access/layout, Inaccessible home environment, Decreased caregiver support, Lack of/limited family support, Incontinence, Neurogenic Bowel & Bladder, Wound Care, Other (comments) Bilateral LE spasticty  OT Home environment access/layout, Other (comments), Neurogenic Bowel & Bladder, Inaccessible home environment    SLP      SW Decreased caregiver support, Lack of/limited family support, Community education officer for SNF coverage     Team Discharge Planning: Destination: PT-Home ,OT- Home , SLP-  Projected Follow-up: PT-Home health  PT, 24 hour supervision/assistance, OT-  Home health OT, SLP-  Projected Equipment Needs: PT-Wheelchair (measurements), Other (comment), OT- To be determined, SLP-  Equipment Details: PT-hoyer, specialty w/c, OT-  Patient/family involved in discharge planning: PT- Patient, Family member/caregiver,  OT-Patient, Family member/caregiver, SLP-   MD ELOS:  3.5 to 4 weeks Medical Rehab Prognosis:  Fair Assessment: The patient has been admitted for CIR therapies with the diagnosis of incomplete quadriplegia with severe spasticity- nontraumatic. The team will be addressing functional mobility, strength, stamina, balance, safety, adaptive techniques and equipment, self-care, bowel and bladder mgt, patient and caregiver education, . Goals have been set at [facility] to mod A. Anticipated discharge destination is home with fiance'.        See Team Conference Notes for weekly updates to the plan of care

## 2023-07-21 MED ORDER — DANTROLENE SODIUM 25 MG PO CAPS
25.0000 mg | ORAL_CAPSULE | Freq: Every day | ORAL | Status: DC
  Administered 2023-07-21 – 2023-07-23 (×3): 25 mg via ORAL
  Filled 2023-07-21 (×3): qty 1

## 2023-07-21 MED ORDER — BACLOFEN 10 MG PO TABS
10.0000 mg | ORAL_TABLET | Freq: Four times a day (QID) | ORAL | Status: DC | PRN
  Administered 2023-07-22 – 2023-07-29 (×4): 10 mg via ORAL
  Filled 2023-07-21 (×4): qty 1

## 2023-07-21 MED ORDER — DANTROLENE SODIUM 25 MG PO CAPS: 50.0000 mg | ORAL_CAPSULE | Freq: Two times a day (BID) | ORAL | Status: DC

## 2023-07-21 MED ORDER — DANTROLENE SODIUM 25 MG PO CAPS: 25.0000 mg | ORAL_CAPSULE | Freq: Two times a day (BID) | ORAL | Status: DC

## 2023-07-21 MED ORDER — DANTROLENE SODIUM 25 MG PO CAPS: 50.0000 mg | ORAL_CAPSULE | Freq: Three times a day (TID) | ORAL | Status: DC

## 2023-07-21 MED ORDER — DANTROLENE SODIUM 25 MG PO CAPS: 25.0000 mg | ORAL_CAPSULE | Freq: Three times a day (TID) | ORAL | Status: DC

## 2023-07-21 MED ORDER — SORBITOL 70 % SOLN
60.0000 mL | Freq: Once | Status: AC
  Administered 2023-07-21: 60 mL via ORAL
  Filled 2023-07-21: qty 60

## 2023-07-21 NOTE — Plan of Care (Signed)
  Problem: Consults Goal: RH SPINAL CORD INJURY PATIENT EDUCATION Description:  See Patient Education module for education specifics.  Outcome: Progressing   Problem: SCI BOWEL ELIMINATION Goal: RH STG MANAGE BOWEL WITH ASSISTANCE Description: STG Manage Bowel with medication with min Assistance. Outcome: Progressing Goal: RH STG SCI MANAGE BOWEL WITH MEDICATION WITH ASSISTANCE Description: STG SCI Manage bowel with medication with assistance. Outcome: Progressing   Problem: SCI BOWEL ELIMINATION Goal: RH STG SCI MANAGE BOWEL WITH MEDICATION WITH ASSISTANCE Description: STG SCI Manage bowel with medication with assistance. Outcome: Progressing   Problem: SCI BLADDER ELIMINATION Goal: RH STG MANAGE BLADDER WITH ASSISTANCE Description: STG Manage Bladder medications with minimal Assistance Outcome: Progressing   Problem: RH PAIN MANAGEMENT Goal: RH STG PAIN MANAGED AT OR BELOW PT'S PAIN GOAL Description: <4 with prns Outcome: Progressing   Problem: RH KNOWLEDGE DEFICIT SCI Goal: RH STG INCREASE KNOWLEDGE OF SELF CARE AFTER SCI Outcome: Progressing

## 2023-07-21 NOTE — Progress Notes (Signed)
PROGRESS NOTE   Subjective/Complaints:   No bowel movement with program last night again- Spasms really bad all night- and didn't have anything else to take- took Zanaflex x1 ~ 7pm last night- but still having full bosy spasms.   We discussed that constipation or impending infection can cause spasticity to be worse.   Really painful/spasms.  Wet bed last night- and cathed afterwards for 650cc- fiance' excited this means he's peeing- I explained that our goal is control of void- likely overflow- base don volumes.      ROS:   Pt denies SOB, abd pain, CP, N/V/ (+)C/D, and vision changes  Negative otherwise, except HPI  Objective:   No results found.  No results for input(s): "WBC", "HGB", "HCT", "PLT" in the last 72 hours.  No results for input(s): "NA", "K", "CL", "CO2", "GLUCOSE", "BUN", "CREATININE", "CALCIUM" in the last 72 hours.   Intake/Output Summary (Last 24 hours) at 07/21/2023 0912 Last data filed at 07/21/2023 0500 Gross per 24 hour  Intake --  Output 1450 ml  Net -1450 ml        Physical Exam: Vital Signs Blood pressure 111/73, pulse 90, temperature (!) 97.3 F (36.3 C), resp. rate 19, height 5\' 9"  (1.753 m), weight 89.9 kg, SpO2 99%.      General: awake, alert, appropriate, -sitting up in bed; fiance' at bedside; NAD HENT: conjugate gaze; oropharynx moist CV: regular rate and rhythm; no JVD Pulmonary: CTA B/L; no W/R/R- good air movement GI: a little firm; slightly distended; hypoactive BS Psychiatric: appropriate- very flat Neurological: a little foggy, but answering questions-  Having full body spasms pretty regularly  MOTOR: RUE: 5/5 Deltoid, 5/5 Biceps, 5/5 Triceps,5/5 Grip LUE: 5/5 Deltoid, 5/5 Biceps, 5/5 Triceps, 5/5 Grip RLE: HF 2+/5, KE 3/5, ADF 4/5, APF 4/5 LLE: HF 2/5, KE 2/5, ADF 2/5, APF 2/5- difficult to test due to increased tone   Severe hypertonia b/l LE left greater  than right   SENSORY: Altered sensation below T4 level     Assessment/Plan: 1. Functional deficits which require 3+ hours per day of interdisciplinary therapy in a comprehensive inpatient rehab setting. Physiatrist is providing close team supervision and 24 hour management of active medical problems listed below. Physiatrist and rehab team continue to assess barriers to discharge/monitor patient progress toward functional and medical goals  Care Tool:  Bathing    Body parts bathed by patient: Right arm, Left arm, Chest, Abdomen, Front perineal area, Face   Body parts bathed by helper: Buttocks, Right upper leg, Right lower leg, Left lower leg, Left upper leg     Bathing assist Assist Level: Maximal Assistance - Patient 24 - 49%     Upper Body Dressing/Undressing Upper body dressing   What is the patient wearing?: Pull over shirt    Upper body assist Assist Level: Moderate Assistance - Patient 50 - 74%    Lower Body Dressing/Undressing Lower body dressing      What is the patient wearing?: Pants, Incontinence brief     Lower body assist Assist for lower body dressing: Dependent - Patient 0%     Toileting Toileting    Toileting assist Assist for toileting:  2 Helpers     Transfers Chair/bed transfer  Transfers assist  Chair/bed transfer activity did not occur: Safety/medical concerns  Chair/bed transfer assist level: 2 Helpers     Locomotion Ambulation   Ambulation assist   Ambulation activity did not occur: Safety/medical concerns (spasticity; weakness)          Walk 10 feet activity   Assist  Walk 10 feet activity did not occur: Safety/medical concerns        Walk 50 feet activity   Assist Walk 50 feet with 2 turns activity did not occur: Safety/medical concerns         Walk 150 feet activity   Assist Walk 150 feet activity did not occur: Safety/medical concerns         Walk 10 feet on uneven surface  activity   Assist  Walk 10 feet on uneven surfaces activity did not occur: Safety/medical concerns         Wheelchair     Assist Is the patient using a wheelchair?: Yes Type of Wheelchair: Power Wheelchair activity did not occur: Safety/medical concerns (w/c malfunction)         Wheelchair 50 feet with 2 turns activity    Assist    Wheelchair 50 feet with 2 turns activity did not occur: Safety/medical concerns       Wheelchair 150 feet activity     Assist  Wheelchair 150 feet activity did not occur: Safety/medical concerns       Blood pressure 111/73, pulse 90, temperature (!) 97.3 F (36.3 C), resp. rate 19, height 5\' 9"  (1.753 m), weight 89.9 kg, SpO2 99%.  Medical Problem List and Plan: 1. Functional deficits secondary to Cervical myelopathy s/p ACDF and PCDF C6-T1 1/17 by Dr. Jake Samples             -patient may shower, please cover incision             -ELOS/Goals: 14-16, Sup to min A             -Admit to CIR   Con't CIR PT and OT- having more spasms this AM  Using hard collar as needed  Limited by spasticity and lethargy from meds.  2.  Antithrombotics: -DVT/anticoagulation:  Pharmaceutical: Lovenox             -antiplatelet therapy: N/A 3. Pain Management:  Tylenol 650 mg qid. Continue MS contin  15mg  w/supper and oxycodone 5mg  prn  1/23- pain mainly from spasms, but also now has new neck pain from posterior fusion- con't regimen  1/26- main spasms from spasticity- neck pain minor per pt 4. Mood/Behavior/Sleep: LCSW to follow for evaluation and support.              --was on Seroquel for sleep- restart              -Restart cymbalta at 30mg  daily             -antipsychotic agents: Seroquel for confusion- on 25-50 mg at bedtime- was on 75 mg at bedtime bfore, will see how does.   1/25- more sleepy this AM- slept well- but could be meds again- will see if can reduce Valium long term 5. Neuropsych/cognition: This patient is intermittently  capable of making decisions on  his own behalf. 6. Skin/Wound Care: Routine pressure relief measures.              --surgical dressing to stay in place with office follow up per d/c summary.  7. Fluids/Electrolytes/Nutrition: Monitor I/O. Check CMET in am             -Continue vitamin C, prosource 8. Spasticity: Continue Valium 5 mg qid, baclofen 20 mg qid, Zanaflex 4 mg every 6 hours prn.    1/23- spasms still a major issue- will monitor for other options. Baclofen has been decreased from prior dose of 30 mg q6 hours- will see if need to increase again. 1/24- d/w pt/fiance- will increase Baclofen to 30 mg q6 hours. 1/25- will add dantrolene back tomorrow-if tolerates and spasticity improves, then maybe can reduce Valium.   1/26- spasticity worse today, likely from constipation, but keep an eye out for  infection - will add back Dantrolene 25 mg at bedtime and titrate up q5 days -will also add Baclofen 10 mg QID prn for severe spasms not controlled with other regimen 9. Neurogenic bladder: Cath every 4 hours to keep bladder volumes < 350 cc. Continue flomax 0.4mg              --1/23-1/25con't cathing, no matter cath volumes 5x/day 10. Neurogenic bowel: Was not on bowel program. Last BM documented on 01/18 --Will change senna to am, continue miralax in am.  --Schedule mini enema at nights.  -Sorbitol scheduled tomorrow    1/23- hasn't had good BM's on acute, so will do sorbitol today.   1/24- had good BM last night with bowel program  1/25- didn't have any results last night- will give sorbitol to get results tonight and increase Senna to 3 tabs daily- already on Miralax daily. 1/26- NO BM with bowel program again last night- will give Sorbitol 60cc and do mini enema tonight if no BM today. Constipation is likely why spasticity is worse.     I spent a total of 39   minutes on total care today- >50% coordination of care- due to  D/w nursing as well as fiance' about bladder, bowel and changes for spasticity   LOS: 4  days A FACE TO FACE EVALUATION WAS PERFORMED  Leon Rodriguez 07/21/2023, 9:12 AM

## 2023-07-22 ENCOUNTER — Encounter (HOSPITAL_COMMUNITY): Payer: Self-pay | Admitting: Physical Medicine and Rehabilitation

## 2023-07-22 ENCOUNTER — Inpatient Hospital Stay (HOSPITAL_COMMUNITY): Payer: Medicaid Other

## 2023-07-22 DIAGNOSIS — R7881 Bacteremia: Secondary | ICD-10-CM

## 2023-07-22 DIAGNOSIS — G959 Disease of spinal cord, unspecified: Secondary | ICD-10-CM

## 2023-07-22 LAB — BASIC METABOLIC PANEL
Anion gap: 13 (ref 5–15)
BUN: 9 mg/dL (ref 6–20)
CO2: 23 mmol/L (ref 22–32)
Calcium: 9.4 mg/dL (ref 8.9–10.3)
Chloride: 97 mmol/L — ABNORMAL LOW (ref 98–111)
Creatinine, Ser: 0.65 mg/dL (ref 0.61–1.24)
GFR, Estimated: >60 mL/min (ref >60)
Glucose, Bld: 109 mg/dL — ABNORMAL HIGH (ref 70–99)
Potassium: 4.1 mmol/L (ref 3.5–5.1)
Sodium: 133 mmol/L — ABNORMAL LOW (ref 135–145)

## 2023-07-22 LAB — URINALYSIS, W/ REFLEX TO CULTURE (INFECTION SUSPECTED)
Bilirubin Urine: NEGATIVE
Glucose, UA: NEGATIVE mg/dL
Hgb urine dipstick: NEGATIVE
Ketones, ur: NEGATIVE mg/dL
Nitrite: NEGATIVE
Protein, ur: NEGATIVE mg/dL
Specific Gravity, Urine: 1.023 (ref 1.005–1.030)
WBC, UA: >50 WBC/hpf (ref 0–5)
pH: 5 (ref 5.0–8.0)

## 2023-07-22 LAB — CBC
HCT: 33 % — ABNORMAL LOW (ref 39.0–52.0)
Hemoglobin: 11.3 g/dL — ABNORMAL LOW (ref 13.0–17.0)
MCH: 32.8 pg (ref 26.0–34.0)
MCHC: 34.2 g/dL (ref 30.0–36.0)
MCV: 95.9 fL (ref 80.0–100.0)
Platelets: 449 10*3/uL — ABNORMAL HIGH (ref 150–400)
RBC: 3.44 MIL/uL — ABNORMAL LOW (ref 4.22–5.81)
RDW: 12.5 % (ref 11.5–15.5)
WBC: 11.2 10*3/uL — ABNORMAL HIGH (ref 4.0–10.5)
nRBC: 0 % (ref 0.0–0.2)

## 2023-07-22 LAB — LACTIC ACID, PLASMA
Lactic Acid, Venous: 2.2 mmol/L (ref 0.5–1.9)
Lactic Acid, Venous: 1.7 mmol/L (ref 0.5–1.9)

## 2023-07-22 LAB — PROCALCITONIN: Procalcitonin: <0.1 ng/mL

## 2023-07-22 MED ORDER — ACETAMINOPHEN 325 MG PO TABS
650.0000 mg | ORAL_TABLET | ORAL | Status: DC | PRN
  Administered 2023-07-22 (×2): 650 mg via ORAL

## 2023-07-22 MED ORDER — PIPERACILLIN-TAZOBACTAM 3.375 G IVPB
3.3750 g | Freq: Three times a day (TID) | INTRAVENOUS | Status: DC
  Administered 2023-07-22 – 2023-07-24 (×6): 3.375 g via INTRAVENOUS
  Filled 2023-07-22 (×8): qty 50

## 2023-07-22 MED ORDER — VANCOMYCIN HCL 1500 MG/300ML IV SOLN
1500.0000 mg | Freq: Two times a day (BID) | INTRAVENOUS | Status: DC
  Administered 2023-07-22 – 2023-07-23 (×4): 1500 mg via INTRAVENOUS
  Filled 2023-07-22 (×7): qty 300

## 2023-07-22 MED ORDER — SODIUM CHLORIDE 0.9 % IV SOLN: INTRAVENOUS | Status: AC

## 2023-07-22 NOTE — Progress Notes (Signed)
RT instructed patient and family member on the use of a flutter valve and incentive spirometer. The patient was able to demonstrate back good technique on both devices.

## 2023-07-22 NOTE — Progress Notes (Signed)
Physical Therapy Session Note  Patient Details  Name: Leon Rodriguez MRN: [id number] Date of Birth: 06/18/1966  Today's Date: 07/22/2023 PT Missed Time: 60 Minutes Missed Time Reason: Patient ill (Comment) (fever, critical lab values, arrythmia per nurse)  Short Term Goals: Week 1:  PT Short Term Goal 1 (Week 1): Pt will require max A with rolling in bilateral directions PT Short Term Goal 2 (Week 1): Pt will direct caregiver with dependent transfer set-up PT Short Term Goal 3 (Week 1): Pt will require min A with power wheelchair mobility 50 ft  Skilled Therapeutic Interventions/Progress Updates:      Therapy Documentation Precautions:  Precautions Precautions: Fall, Cervical Precaution Comments: severe muscle spasms, hard collar for comfort Required Braces or Orthoses: Cervical Brace Cervical Brace: Hard collar Restrictions Weight Bearing Restrictions Per Provider Order: No Other Position/Activity Restrictions: per [name] PA, Pt can wear cervical brace PRN as on 1/20 General: PT Amount of Missed Time (min): 60 Minutes PT Missed Treatment Reason: Patient ill (Comment) (fever, critical lab values, arrythmia per nurse)  Pt missed 60 minutes of skilled PT 2/2 illness (fever, arrhthymias, and critical lab values. Plan to make up missed minutes as able.     Therapy/Group: Individual Therapy  Truitt Leep Truitt Leep PT, DPT  07/22/2023, 12:34 PM

## 2023-07-22 NOTE — Progress Notes (Signed)
Occupational Therapy Session Note  Patient Details  Name: Leon Rodriguez MRN: [id number] Date of Birth: 11-02-1965  Today's Date: 07/22/2023 OT Individual Time: 1330-1400 OT Individual Time Calculation (min): 30 min    Short Term Goals: Week 1:  OT Short Term Goal 1 (Week 1): Pt will complete bed mobility Mod A in preparation for LB ADLs OT Short Term Goal 2 (Week 1): Pt will complete LB dressing Max A OT Short Term Goal 3 (Week 1): Pt will complete UB dressing SBA  Skilled Therapeutic Interventions/Progress Updates:    OT intervention with focus on bed mobility, sitting balance, TB transfers, and PWC mobility. Supine>sit EOB with max A this afternoon. Sitting balance EOB with min A. TB transfer with increased assistance this afternoon 2/2 Lt shoulder pain (see below); Pt assisted with repositioning in PWC. Pt remained in w/c with seat belt attached. SO present and all needs within reach.   Therapy Documentation Precautions:  Precautions Precautions: Fall, Cervical Precaution Comments: severe muscle spasms, hard collar for comfort Required Braces or Orthoses: Cervical Brace Cervical Brace: Hard collar Restrictions Weight Bearing Restrictions Per Provider Order: No Other Position/Activity Restrictions: per [name] PA, Pt can wear cervical brace PRN as on 1/20 Pain:  Ongoing BLE spasms with accompanying grimaces; gentle stretching/massage Pt also c/o Lt shoulder discomfort from overuse; rest and repositioning   Therapy/Group: Individual Therapy  Rich Brave 07/22/2023, 2:56 PM

## 2023-07-22 NOTE — Plan of Care (Signed)
Pt's plan of care adjusted to 15/7 after speaking with care team and discussed with MD in team conference as pt currently unable to tolerate current therapy schedule with OT, PT, and SLP.

## 2023-07-22 NOTE — Progress Notes (Signed)
Discussed patient with Dr. Jake Samples. He felt that UTI likely cause (had same issues after first surgery) and no need for imaging at this time. Has had worsening of spacticity likely due to UTI/infection. Started on broad spectrum antibiotics pending cultures.

## 2023-07-22 NOTE — Discharge Instructions (Addendum)
Inpatient Rehab Discharge Instructions  Leon Rodriguez Discharge date and time:    Activities/Precautions/ Functional Status: Activity: activity as tolerated Diet: regular diet Wound Care: keep wound clean and dry   Functional status:  ___ No restrictions     ___ Walk up steps independently ___ 24/7 supervision/assistance   ___ Walk up steps with assistance ___ Intermittent supervision/assistance  ___ Bathe/dress independently ___ Walk with walker     ___ Bathe/dress with assistance ___ Walk Independently    ___ Shower independently ___ Walk with assistance    ___ Shower with assistance _X__ No alcohol     ___ Return to work/school ________   Special Instructions:    COMMUNITY REFERRALS UPON DISCHARGE:    Home Health:   PT      OT      SNA                       Agency:  [facility]    Phone:  [phone] *Please expect follow-up within 2-3 business days for discharge to schedule your home visit. If you have not received follow-up, be sure to contact the site directly.*    Medical Equipment/Items Ordered: hoyer lift, hospital bed, drop arm bedside commode, and tub transfer bench                                                 Agency/Supplier: [employer] [phone]   Medical Equipment/Items Ordered: specialty wheelchair                                                 Agency/Supplier: [facility] [phone]  GENERAL COMMUNITY RESOURCES FOR PATIENT/FAMILY: 1) A personal care services (PCS) referral was submitted on your behalf to your insurance to LTSS Department [phone]. Be sure to call [name] at #[phone] to schedule an assessment.  2) A referral was made to [facility] for CAP/DA program. This packet can take 7-21 business days to arrive. Once this arrives, please take to [facility] for them to complete #[phone].  3) If transportation services are needs, be sure to contact [facility] at [phone] to  schedule medical appointments. Be sure to contact within 3-5 business days to schedule.     My questions have been answered and I understand these instructions. I will adhere to these goals and the provided educational materials after my discharge from the hospital.  Patient/Caregiver Signature _______________________________ Date __________  Clinician Signature _______________________________________ Date __________  Please bring this form and your medication list with you to all your follow-up doctor's appointments.

## 2023-07-22 NOTE — Progress Notes (Signed)
Pharmacy Antibiotic Note  OCTAVE MONTROSE is a 58 y.o. male admitted on 07/17/2023 with  wound infection .  Pharmacy has been consulted for zosyn / vanco dosing.  Plan: Vancomycin 1500 mg IV every 12 hours.  eAUC 527, scr 0.8, Vd 0.72, IBW Zosyn 3.375g IV q8h (4 hour infusion).  Height: 5\' 9"  (175.3 cm) Weight: 89.9 kg (198 lb 1.6 oz) IBW/kg (Calculated) : 70.7  Temp (24hrs), Avg:100.3 F (37.9 C), Min:97.8 F (36.6 C), Max:102.8 F (39.3 C)  Recent Labs  Lab 07/18/23 0518 07/22/23 0636 07/22/23 0721  WBC 7.6 11.2*  --   CREATININE 0.67 0.65  --   LATICACIDVEN  --   --  2.2*    Estimated Creatinine Clearance: 111.6 mL/min (by C-G formula based on SCr of 0.65 mg/dL).    No Known Allergies  Antimicrobials this admission: Vanc 1/27 >>   Zosyn 1/27 >>   Microbiology results: 1/27 UCx: ip    Thank you for allowing pharmacy to be a part of this patient's care.  Greta Doom BS, PharmD, BCPS Clinical Pharmacist 07/22/2023 9:11 AM  Contact: [phone] after 3 PM  "Be curious, not judgmental..." -Debbora Dus

## 2023-07-22 NOTE — Progress Notes (Signed)
Physical Therapy Session Note  Patient Details  Name: Leon Rodriguez MRN: [id number] Date of Birth: 1966-01-24  Today's Date: 07/22/2023 PT Individual Time: 1445-1530 PT Individual Time Calculation (min): 45 min   Short Term Goals: Week 1:  PT Short Term Goal 1 (Week 1): Pt will require max A with rolling in bilateral directions PT Short Term Goal 2 (Week 1): Pt will direct caregiver with dependent transfer set-up PT Short Term Goal 3 (Week 1): Pt will require min A with power wheelchair mobility 50 ft  Skilled Therapeutic Interventions/Progress Updates:    Pt received in PWC, agreeable to therapy. Pt reports feeling much better vs earlier in the day. Pt reports pain in his shoulders and neck, no intervention required at this time, but discussed adding support from brace or headrest for pain management.Pt navigated PWC through room and halls/gym with supervision. Pt with IV running at this time, so therapist managed IV pole throughout session.  Pt provided with passive ROM and stretching at chair level with focus on hamstrings and abduction. Note fewer spasms with stretching vs previous days, and pain at manageable level. Pt then participated in active ROM for RLE, LAQ, dorsiflexion, and hip flexion x 10 each to improve LE strength and neuromuscular control for transfers. Pt required active assist and tapping facilitation for LLE to perform same motions. Pt returned to room and remained in PWC, was left with all needs in reach and alarm active.   Therapy Documentation Precautions:  Precautions Precautions: Fall, Cervical Precaution Comments: severe muscle spasms, hard collar for comfort Required Braces or Orthoses: Cervical Brace Cervical Brace: Hard collar Restrictions Weight Bearing Restrictions Per Provider Order: No Other Position/Activity Restrictions: per [name] PA, Pt can wear cervical brace PRN as on 1/20 General: PT Amount of Missed Time (min): 60 Minutes PT Missed  Treatment Reason: Patient ill (Comment) (fever, critical lab values, arrythmia per nurse)     Therapy/Group: Individual Therapy  Juluis Rainier 07/22/2023, 3:41 PM

## 2023-07-22 NOTE — Progress Notes (Signed)
PROGRESS NOTE   Subjective/Complaints:  Pt with temp of 1025 this am.  Has had some chills. Denies sob, cough, odor in urine. Spasms have been bad this week but seem to be a little better this morning however.         ROS:   Pt denies SOB, abd pain, CP, N/V/ (+)C/D, and vision changes  Negative otherwise, except HPI  Objective:   DG Chest 1 View Result Date: 07/22/2023 CLINICAL DATA:  58 year old male with history of fever. EXAM: CHEST  1 VIEW COMPARISON:  Chest x-ray 07/15/2023. FINDINGS: Lung volumes are low. No consolidative airspace disease. No pleural effusions. No pneumothorax. No pulmonary nodule or mass noted. Pulmonary vasculature and the cardiomediastinal silhouette are within normal limits. Orthopedic fixation hardware in the lower cervical spine incidentally noted. IMPRESSION: 1. Low lung volumes without radiographic evidence of acute cardiopulmonary disease. Electronically Signed   By: Trudie Reed M.D.   On: 07/22/2023 07:22    Recent Labs    07/22/23 0636  WBC 11.2*  HGB 11.3*  HCT 33.0*  PLT 449*    Recent Labs    07/22/23 0636  NA 133*  K 4.1  CL 97*  CO2 23  GLUCOSE 109*  BUN 9  CREATININE 0.65  CALCIUM 9.4     Intake/Output Summary (Last 24 hours) at 07/22/2023 0912 Last data filed at 07/22/2023 0405 Gross per 24 hour  Intake 240 ml  Output 300 ml  Net -60 ml     Pressure Injury 07/22/23 Heel Left Stage 2 -  Partial thickness loss of dermis presenting as a shallow open injury with a red, pink wound bed without slough. blister that has ruptured with drainage (Active)  07/22/23 0730  Location: Heel  Location Orientation: Left  Staging: Stage 2 -  Partial thickness loss of dermis presenting as a shallow open injury with a red, pink wound bed without slough.  Wound Description (Comments): blister that has ruptured with drainage  Present on Admission: Yes    Physical Exam: Vital  Signs Blood pressure 106/71, pulse (!) 116, temperature (!) 100.8 F (38.2 C), temperature source Oral, resp. rate 19, height 5\' 9"  (1.753 m), weight 89.9 kg, SpO2 100%.      Constitutional: No distress . Vital signs reviewed. HEENT: NCAT, EOMI, oral membranes moist Neck: supple Cardiovascular: RRR without murmur. No JVD    Respiratory/Chest: CTA Bilaterally without wheezes or rales. Normal effort    GI/Abdomen: BS +, non-tender, non-distended Ext: no clubbing, cyanosis, or edema Psych: pleasant and cooperative  Neurological: very alert. Mentation appropriate. Speech clear. MOTOR: RUE: 5/5 Deltoid, 5/5 Biceps, 5/5 Triceps,5/5 Grip LUE: 5/5 Deltoid, 5/5 Biceps, 5/5 Triceps, 5/5 Grip RLE: HF 2+/5, KE 3/5, ADF 4/5, APF 4/5 LLE: HF 2/5, KE 2/5, ADF 2/5, APF 2/5- difficult to test due to increased tone   Severe hypertonia b/l LE left greater than right, 3-4/4   SENSORY: Altered sensation below T4 level --unchanged    Assessment/Plan: 1. Functional deficits which require 3+ hours per day of interdisciplinary therapy in a comprehensive inpatient rehab setting. Physiatrist is providing close team supervision and 24 hour management of active medical problems listed below. Physiatrist and  rehab team continue to assess barriers to discharge/monitor patient progress toward functional and medical goals  Care Tool:  Bathing    Body parts bathed by patient: Right arm, Left arm, Chest, Abdomen, Front perineal area, Face   Body parts bathed by helper: Buttocks, Right upper leg, Right lower leg, Left lower leg, Left upper leg     Bathing assist Assist Level: Maximal Assistance - Patient 24 - 49%     Upper Body Dressing/Undressing Upper body dressing   What is the patient wearing?: Pull over shirt    Upper body assist Assist Level: Moderate Assistance - Patient 50 - 74%    Lower Body Dressing/Undressing Lower body dressing      What is the patient wearing?: Pants, Incontinence  brief     Lower body assist Assist for lower body dressing: Dependent - Patient 0%     Toileting Toileting    Toileting assist Assist for toileting: 2 Helpers     Transfers Chair/bed transfer  Transfers assist  Chair/bed transfer activity did not occur: Safety/medical concerns  Chair/bed transfer assist level: 2 Helpers     Locomotion Ambulation   Ambulation assist   Ambulation activity did not occur: Safety/medical concerns (spasticity; weakness)          Walk 10 feet activity   Assist  Walk 10 feet activity did not occur: Safety/medical concerns        Walk 50 feet activity   Assist Walk 50 feet with 2 turns activity did not occur: Safety/medical concerns         Walk 150 feet activity   Assist Walk 150 feet activity did not occur: Safety/medical concerns         Walk 10 feet on uneven surface  activity   Assist Walk 10 feet on uneven surfaces activity did not occur: Safety/medical concerns         Wheelchair     Assist Is the patient using a wheelchair?: Yes Type of Wheelchair: Power Wheelchair activity did not occur: Safety/medical concerns (w/c malfunction)         Wheelchair 50 feet with 2 turns activity    Assist    Wheelchair 50 feet with 2 turns activity did not occur: Safety/medical concerns       Wheelchair 150 feet activity     Assist  Wheelchair 150 feet activity did not occur: Safety/medical concerns       Blood pressure 106/71, pulse (!) 116, temperature (!) 100.8 F (38.2 C), temperature source Oral, resp. rate 19, height 5\' 9"  (1.753 m), weight 89.9 kg, SpO2 100%.  Medical Problem List and Plan: 1. Functional deficits secondary to Cervical myelopathy s/p ACDF and PCDF C6-T1 1/17 by Dr. Jake Samples             -patient may shower, please cover incision             -ELOS/Goals: 14-16, Sup to min A             -Admit to CIR   -Continue CIR therapies including PT, OT   Using hard collar as  needed  Still a difficult balance between spasticity and tolerance of meds  2.  Antithrombotics: -DVT/anticoagulation:  Pharmaceutical: Lovenox             -antiplatelet therapy: N/A 3. Pain Management:  Tylenol 650 mg qid. Continue MS contin  15mg  w/supper and oxycodone 5mg  prn  1/23- pain mainly from spasms, but also now has new neck pain from posterior  fusion- con't regimen  1/26- main spasms from spasticity- neck pain minor per pt 4. Mood/Behavior/Sleep: LCSW to follow for evaluation and support.              --was on Seroquel for sleep- restart              -Restart cymbalta at 30mg  daily             -antipsychotic agents: Seroquel for confusion- on 25-50 mg at bedtime- was on 75 mg at bedtime bfore, will see how does.   1/25- more sleepy this AM- slept well- but could be meds again- will see if can reduce Valium long term 5. Neuropsych/cognition: This patient is intermittently  capable of making decisions on his own behalf. 6. Skin/Wound Care: Routine pressure relief measures.              --surgical dressing to stay in place with office follow up per d/c summary.  7. Fluids/Electrolytes/Nutrition: Monitor I/O. Check CMET in am             -Continue vitamin C, prosource 8. Spasticity: Continue Valium 5 mg qid, baclofen 20 mg qid, Zanaflex 4 mg every 6 hours prn.    1/23- spasms still a major issue- will monitor for other options. Baclofen has been decreased from prior dose of 30 mg q6 hours- will see if need to increase again. 1/24- d/w pt/fiance- will increase Baclofen to 30 mg q6 hours. 1/25- will add dantrolene back tomorrow-if tolerates and spasticity improves, then maybe can reduce Valium.   1/27 spasticity on going -see ID discussion below - k Dantrolene 25 mg at bedtime and titrate up q5 days per Dr. Elbert Ewings -  Baclofen 10 mg QID prn has been added for severe spasms not controlled with other regimen 9. Neurogenic bladder: Cath every 4 hours to keep bladder volumes < 350 cc. Continue  flomax 0.4mg              --1/23-1/25con't cathing, no matter cath volumes 5x/day 10. Neurogenic bowel: Was not on bowel program. Last BM documented on 01/18 --Will change senna to am, continue miralax in am.  --Schedule mini enema at nights.  -Sorbitol scheduled tomorrow    1/23- hasn't had good BM's on acute, so will do sorbitol today.   1/24- had good BM last night with bowel program  1/25- didn't have any results last night- will give sorbitol to get results tonight and increase Senna to 3 tabs daily- already on Miralax daily. 1/26- NO BM with bowel program again last night- will give Sorbitol 60cc and do mini enema tonight if no BM today. Constipation is likely why spasticity is worse.    1/27 had large bm yesterday morning 11. Fever:  -WBC's up to 11k, Tmx 102.5  -Lactate 2.2  -cxr clear, ua, ucx pending. No obvious associated sx other than spasticity perhaps  -begin empiric vanc and zosyn to cover surgical site/bacteremia  -1/23 dopplers clean  -reach out to NS regarding potential follow up imaging of neck  At least 50 total minutes were spent in examination of patient, assessment of pertinent data,  formulation of a treatment plan, and in discussion with patient, family, and/or treating providers.     LOS: 5 days A FACE TO FACE EVALUATION WAS PERFORMED  Ranelle Oyster 07/22/2023, 9:12 AM

## 2023-07-22 NOTE — Plan of Care (Signed)
Patient wife called out with patient having the shakes around 610am. Tech got patients temperature, 102.1F. No PRN tylenol available. Dan Angiulli notified and PRN tylenol for fever added. Proceded to get a set of vitals, set off [place] for HR and Temp. Dan Angiulli notified, further orders added. Patient resting comfortably in bed.   Problem: Consults Goal: RH SPINAL CORD INJURY PATIENT EDUCATION Description:  See Patient Education module for education specifics.  Outcome: Progressing   Problem: SCI BOWEL ELIMINATION Goal: RH STG MANAGE BOWEL WITH ASSISTANCE Description: STG Manage Bowel with medication with min Assistance. Outcome: Progressing Goal: RH STG SCI MANAGE BOWEL WITH MEDICATION WITH ASSISTANCE Description: STG SCI Manage bowel with medication with assistance. Outcome: Progressing   Problem: SCI BLADDER ELIMINATION Goal: RH STG MANAGE BLADDER WITH ASSISTANCE Description: STG Manage Bladder medications with minimal Assistance Outcome: Progressing Goal: RH STG MANAGE BLADDER WITH MEDICATION WITH ASSISTANCE Description: STG Manage Bladder With Medication With Assistance. Outcome: Progressing   Problem: RH SAFETY Goal: RH STG ADHERE TO SAFETY PRECAUTIONS W/ASSISTANCE/DEVICE Description: STG Adhere to Safety Precautions With min Assistance/Device. Outcome: Progressing   Problem: RH PAIN MANAGEMENT Goal: RH STG PAIN MANAGED AT OR BELOW PT'S PAIN GOAL Description: <4 with prns Outcome: Progressing   Problem: RH KNOWLEDGE DEFICIT SCI Goal: RH STG INCREASE KNOWLEDGE OF SELF CARE AFTER SCI Outcome: Progressing

## 2023-07-22 NOTE — Progress Notes (Signed)
Occupational Therapy Session Note  Patient Details  Name: Leon Rodriguez MRN: [id number] Date of Birth: 07-Nov-1965  Today's Date: 07/22/2023 OT Individual Time: 0930-1000 OT Individual Time Calculation (min): 30 min  and Today's Date: 07/22/2023 OT Missed Time: 45 Minutes Missed Time Reason: Nursing care   Short Term Goals: Week 1:  OT Short Term Goal 1 (Week 1): Pt will complete bed mobility Mod A in preparation for LB ADLs OT Short Term Goal 2 (Week 1): Pt will complete LB dressing Max A OT Short Term Goal 3 (Week 1): Pt will complete UB dressing SBA  Skilled Therapeutic Interventions/Progress Updates:    Pt with critical lab values but cleared for therapy. Pt requested to get into PWC. Pt dependent for donning pants at bed level. Supine>sit EOB with mod A. Sitting balance with min A. TB tranfser to w/c with mod A and +2 for safety. Pt assisted with repositoiining in w/c. Pt remained in w/c for nursing care. RN present. SO present.   Therapy Documentation Precautions:  Precautions Precautions: Fall, Cervical Precaution Comments: severe muscle spasms, hard collar for comfort Required Braces or Orthoses: Cervical Brace Cervical Brace: Hard collar Restrictions Weight Bearing Restrictions Per Provider Order: No Other Position/Activity Restrictions: per [name] PA, Pt can wear cervical brace PRN as on 1/20 General: General OT Amount of Missed Time: 45 Minutes Pain:  Ongoing BLE spasms with accompanying pain; relaxation techniques and gentle stretching   Therapy/Group: Individual Therapy  Rich Brave 07/22/2023, 10:18 AM

## 2023-07-23 NOTE — Evaluation (Signed)
Speech Language Pathology Assessment and Plan  Patient Details  Name: Leon Rodriguez MRN: [id number] Date of Birth: 10-07-1965  SLP Diagnosis:    Rehab Potential:  ELOS:     Today's Date: 07/23/2023 SLP Individual Time: 8413-2440 SLP Individual Time Calculation (min): 54 min   Hospital Problem: Principal Problem:   Cervical myelopathy (HCC)  Past Medical History:  Past Medical History:  Diagnosis Date   Arthritis    Hip pain    Renal calculi    left   Sepsis (HCC) 06/13/2023   Past Surgical History:  Past Surgical History:  Procedure Laterality Date   ANTERIOR CERVICAL DECOMP/DISCECTOMY FUSION N/A 06/05/2023   Procedure: ANTERIOR CERVICAL DISCECTOMY AND FUSION CERVICAL SIX-SEVEN/CERVICAL SEVEN-THORACIC ONE;  Surgeon: Bethann Goo, DO;  Location: [facility] OR;  Service: Neurosurgery;  Laterality: N/A;  3C   APPENDECTOMY     CIRCUMCISION     HIATAL HERNIA REPAIR     HIP SURGERY     at 33 and 58 yo   POSTERIOR CERVICAL FUSION/FORAMINOTOMY N/A 07/12/2023   Procedure: POSTERIOR CERVICAL LAMINECTOMY, INSTRUMENTATION AND FUSION CERVICAL SIX-CERVICAL SEVEN, CERVICAL SEVEN-THORACIC ONE;  Surgeon: Dawley, Alan Mulder, DO;  Location: [facility] OR;  Service: Neurosurgery;  Laterality: N/A;    Assessment / Plan / Recommendation Clinical Impression  Leon Rodriguez is a 58 year old male with history of hip pain with spasms progressing to BLE weakness and 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 He was originally admitted on 06/05/23 for ACDF C6-T1 by Dr. Jake Samples and had worsening of LE weakness with neurogenic bowel and bladder. He was admitted to CIR on 12/13 for intensive rehab program which was limited by ongoing issues with pain and spasticity. He developed sepsis due to Kleb aerogenous bacteremia secondary to UTI and required transfer to acute from 12/18-12/24 for management. He was readmitted to CIR on 06/18/23 and continued to have severe spasticity with pain limiting activity and attempts at rehab. He  had side effects to multiple medications as well as issues with confusion with due to higher doses of narcotics. Repeat MRI C spine showed severe canal stenosis at C7-T1 and Dr. Jake Samples recommended posterior cervical laminectomy C6  to T1 in hopes of improving spasticity.   He underwent bilateral C6, C7 and T1 laminectomies with posterior cervical decompression w/fusion C6/C7 and C7/T1 on 07/12/23. Post op PT and OT consulted to resume therapy. Foley has been in place due to neurogenic bladder.  Foley removed this evening at CIR. Pt reports he feels much better overall since his surgery. Patient is currently requiring mod to max +2 assist for bed mobility and to sit at EOB for 5 minutes. CIR consulted to resume intensive rehab program due to ongoing deficits. Pt admitted to CIR on 07/17/23.  Cognitive Linguistic: COGNISTAT administered with pt demonstrating WNL on all subtest. Informally pt able to verbalize his recent medical hx, safety precautions, and PLOF. He was able to interpret therapy schedule and verbalize how and for what to use call button for. Pt's friend at bedside and reports pt appears to be functioning at his cognitive baseline. No further skilled SLP intervention warranted at this time.   Skilled Therapeutic Interventions          Informal assessment measures, and COGNISTAT administered. Please see full report for additional details.     SLP Assessment  Patient does not need any further Speech Lanaguage Pathology Services    Recommendations  Supervision: Patient able to self feed Compensations: Minimize environmental distractions Oral  Care Recommendations: Oral care BID Patient destination: Home Follow up Recommendations: None Equipment Recommended: None recommended by SLP    SLP Frequency     SLP Duration  SLP Intensity  SLP Treatment/Interventions            Pain Pain Assessment Pain Scale: 0-10 Pain Score: 0-No pain  Prior Functioning Cognitive/Linguistic Baseline:  Within functional limits Type of Home: House  Lives With: Significant other Available Help at Discharge: Family;Available 24 hours/day Vocation: Full time employment  SLP Evaluation Cognition Overall Cognitive Status: Within Functional Limits for tasks assessed Arousal/Alertness: Awake/alert Orientation Level: Oriented X4 Year: 2025 Month: January Day of Week: Correct Attention: Focused;Sustained Focused Attention: Appears intact Sustained Attention: Appears intact Memory: Appears intact Awareness: Appears intact Problem Solving: Appears intact Problem Solving Impairment: Functional basic Safety/Judgment: Appears intact  Comprehension Auditory Comprehension Overall Auditory Comprehension: Appears within functional limits for tasks assessed Expression Expression Primary Mode of Expression: Verbal Verbal Expression Overall Verbal Expression: Appears within functional limits for tasks assessed Oral Motor Oral Motor/Sensory Function Overall Oral Motor/Sensory Function: Within functional limits Motor Speech Overall Motor Speech: Appears within functional limits for tasks assessed  Care Tool Care Tool Cognition Ability to hear (with hearing aid or hearing appliances if normally used Ability to hear (with hearing aid or hearing appliances if normally used): 0. Adequate - no difficulty in normal conservation, social interaction, listening to TV   Expression of Ideas and Wants Expression of Ideas and Wants: 4. Without difficulty (complex and basic) - expresses complex messages without difficulty and with speech that is clear and easy to understand   Understanding Verbal and Non-Verbal Content Understanding Verbal and Non-Verbal Content: 4. Understands (complex and basic) - clear comprehension without cues or repetitions  Memory/Recall Ability Memory/Recall Ability : Current season;Location of own room;Staff names and faces;That he or she is in a hospital/hospital unit   Short Term  Goals: Week 1:    Refer to Care Plan for Long Term Goals  Recommendations for other services: Neuropsych  Discharge Criteria: Patient will be discharged from SLP if patient refuses treatment 3 consecutive times without medical reason, if treatment goals not met, if there is a change in medical status, if patient makes no progress towards goals or if patient is discharged from hospital.  The above assessment, treatment plan, treatment alternatives and goals were discussed and mutually agreed upon: by patient  Renaee Munda 07/23/2023, 4:55 PM

## 2023-07-23 NOTE — Progress Notes (Signed)
Patient ID: Leon Rodriguez, male   DOB: 07/17/1965, 58 y.o.   MRN: [id number]  1414- SW spoke with pt s/o [name] to provide updates from team conference, and d/c date 2/6. She expressed being overwhelmed about his care needs, and will eventually have to return to work. SW reminded her to make contact with pt insurance about PCS services to schedule home visit for assessment. SW will order DME. Fam edu scheduled for Monday (2/3) 9am-12pm.   1426-SW spoke with West Carbo RN CM with insurance  [phone]) to inform on pt discharge date.    SW updated [facility] on updates on pt d/c date.   SW ordered DME with [employer] via parachute.   [name], MSW, LCSW Office: [phone] Cell: [phone] Fax: [phone]

## 2023-07-23 NOTE — Progress Notes (Signed)
Wife assisted with the bowel program. Patient had medium output.

## 2023-07-23 NOTE — Plan of Care (Signed)
Problem: Consults Goal: RH SPINAL CORD INJURY PATIENT EDUCATION Description:  See Patient Education module for education specifics.  Outcome: Progressing   Problem: SCI BOWEL ELIMINATION Goal: RH STG MANAGE BOWEL WITH ASSISTANCE Description: STG Manage Bowel with medication with min Assistance. Outcome: Progressing Goal: RH STG SCI MANAGE BOWEL WITH MEDICATION WITH ASSISTANCE Description: STG SCI Manage bowel with medication with assistance. Outcome: Progressing   Problem: SCI BLADDER ELIMINATION Goal: RH STG MANAGE BLADDER WITH ASSISTANCE Description: STG Manage Bladder medications with minimal Assistance Outcome: Progressing Goal: RH STG MANAGE BLADDER WITH MEDICATION WITH ASSISTANCE Description: STG Manage Bladder With Medication With Assistance. Outcome: Progressing   Problem: RH SAFETY Goal: RH STG ADHERE TO SAFETY PRECAUTIONS W/ASSISTANCE/DEVICE Description: STG Adhere to Safety Precautions With min Assistance/Device. Outcome: Progressing   Problem: RH PAIN MANAGEMENT Goal: RH STG PAIN MANAGED AT OR BELOW PT'S PAIN GOAL Description: <4 with prns Outcome: Progressing

## 2023-07-23 NOTE — Progress Notes (Signed)
Physical Therapy Session Note  Patient Details  Name: Leon Rodriguez MRN: [id number] Date of Birth: [birth year]  Today's Date: 07/23/2023 PT Individual Time: 1140-1205 PT Individual Time Calculation (min): 25 min   Short Term Goals: Week 1:  PT Short Term Goal 1 (Week 1): Pt will require max A with rolling in bilateral directions PT Short Term Goal 2 (Week 1): Pt will direct caregiver with dependent transfer set-up PT Short Term Goal 3 (Week 1): Pt will require min A with power wheelchair mobility 50 ft  Skilled Therapeutic Interventions/Progress Updates:    pt received in PWC and agreeable to therapy. New new c/o pain. Pt navigated PWC with supervision and VC, Pt with IV running at this time, so therapist managed IV pole throughout session.  Pt's brother present who will now be assisting with pt care at home. Provided education on slideboard transfer with +2 assist. Therapist emphasized needing 2 people for safety when performing transfer. Pt performed with mod a and +2 standing by, then with pt's brother with therapist providing min a as +2. Discussed safety, biomechanics, and sequencing of transfer. Pt returned to room and remained in PWC with his brother present.   Therapy Documentation Precautions:  Precautions Precautions: Fall, Cervical Precaution Comments: severe muscle spasms, hard collar for comfort Required Braces or Orthoses: Cervical Brace Cervical Brace: Hard collar Restrictions Weight Bearing Restrictions Per Provider Order: No Other Position/Activity Restrictions: per [name] PA, Pt can wear cervical brace PRN as on 1/20 General:       Therapy/Group: Individual Therapy  Juluis Rainier 07/23/2023, 12:32 PM

## 2023-07-23 NOTE — Progress Notes (Signed)
Occupational Therapy Session Note  Patient Details  Name: Leon Rodriguez MRN: [id number] Date of Birth: 1965-07-18  Today's Date: 07/23/2023 OT Individual Time: 1000-1055 OT Individual Time Calculation (min): 55 min    Short Term Goals: Week 1:  OT Short Term Goal 1 (Week 1): Pt will complete bed mobility Mod A in preparation for LB ADLs OT Short Term Goal 2 (Week 1): Pt will complete LB dressing Max A OT Short Term Goal 3 (Week 1): Pt will complete UB dressing SBA  Skilled Therapeutic Interventions/Progress Updates:    OT intervention with focus on bed mobility, BLE PROM/stretching, sitting balance, TB transfers, PWC mobility, and BUE PROM/therex. Pt reports he has had BUE "shoulder separations" in the past. Pt reports discomfort with use and heavy reliance on BUE for transfers. Supine>sit EOB with mod A. Sitting balance with CGA. TB transfer this morning with max A 2/2 increase BLE spasticity/spasms. PWC mobility with supervision. BUE PROM and AROM for joint mobility and strengthening. Pt returned to room and remained in PWC with NT present.  Therapy Documentation Precautions:  Precautions Precautions: Fall, Cervical Precaution Comments: severe muscle spasms, hard collar for comfort Required Braces or Orthoses: Cervical Brace Cervical Brace: Hard collar Restrictions Weight Bearing Restrictions Per Provider Order: No Other Position/Activity Restrictions: per [name] PA, Pt can wear cervical brace PRN as on 1/20 Pain: Pt with ongoing BLE spasms with accompanying grimaces; repositionied and meds admin prior to therapy Therapy/Group: Individual Therapy  Rich Brave 07/23/2023, 11:43 AM

## 2023-07-23 NOTE — Progress Notes (Signed)
PROGRESS NOTE   Subjective/Complaints:  Pt reports feeling somewhat better with Zosyn.   But spasms a lot because hasn't had AM meds yet.  Has sore on rectum per fiance'.  Also sore on L ankle- not wearing Prevalons boots- doesn't like them.   Said didn't get cathed for 6 hours yesterday- will look into it.   Was told by Rinaldo Cloud to do caths q3 hours until off IVFs.  3 BM's yesterday.  Will change to 15/7 to help him per therapy- they asked for it.  Also has swelling around incision-     ROS:   Pt denies SOB, abd pain, CP, N/V/ (+)C/D, and vision changes  Negative otherwise, except HPI  Objective:   DG Chest 1 View Result Date: 07/22/2023 CLINICAL DATA:  58 year old male with history of fever. EXAM: CHEST  1 VIEW COMPARISON:  Chest x-ray 07/15/2023. FINDINGS: Lung volumes are low. No consolidative airspace disease. No pleural effusions. No pneumothorax. No pulmonary nodule or mass noted. Pulmonary vasculature and the cardiomediastinal silhouette are within normal limits. Orthopedic fixation hardware in the lower cervical spine incidentally noted. IMPRESSION: 1. Low lung volumes without radiographic evidence of acute cardiopulmonary disease. Electronically Signed   By: Trudie Reed M.D.   On: 07/22/2023 07:22    Recent Labs    07/22/23 0636  WBC 11.2*  HGB 11.3*  HCT 33.0*  PLT 449*    Recent Labs    07/22/23 0636  NA 133*  K 4.1  CL 97*  CO2 23  GLUCOSE 109*  BUN 9  CREATININE 0.65  CALCIUM 9.4     Intake/Output Summary (Last 24 hours) at 07/23/2023 1921 Last data filed at 07/23/2023 1700 Gross per 24 hour  Intake 2637.82 ml  Output 2700 ml  Net -62.18 ml     Pressure Injury 07/17/23 Heel Left Stage 2 -  Partial thickness loss of dermis presenting as a shallow open injury with a red, pink wound bed without slough. blister that has ruptured with drainage (Active)  07/17/23 (as per nurse Toni Amend)  1700  Location: Heel  Location Orientation: Left  Staging: Stage 2 -  Partial thickness loss of dermis presenting as a shallow open injury with a red, pink wound bed without slough.  Wound Description (Comments): blister that has ruptured with drainage  Present on Admission: Yes    Physical Exam: Vital Signs Blood pressure 113/71, pulse 97, temperature 98.6 F (37 C), temperature source Oral, resp. rate 18, height 5\' 9"  (1.753 m), weight 89.9 kg, SpO2 100%.       General: awake, alert, appropriate, NAD HENT: conjugate gaze; oropharynx moist CV: regular rate and rhythm- rate in 90's; no JVD Pulmonary: CTA B/L; no W/R/R- good air movement GI: soft, NT, ND, (+)BS Psychiatric: appropriate- flat, but interactive Neurological: Ox3  Fewer full body spasms this AM Neurological: very alert. Mentation appropriate. Speech clear. Skin- incision looks ok, but significant localized swelling- like large seroma.  MOTOR: RUE: 5/5 Deltoid, 5/5 Biceps, 5/5 Triceps,5/5 Grip LUE: 5/5 Deltoid, 5/5 Biceps, 5/5 Triceps, 5/5 Grip RLE: HF 2+/5, KE 3/5, ADF 4/5, APF 4/5 LLE: HF 2/5, KE 2/5, ADF 2/5, APF 2/5- difficult to test due  to increased tone   Severe hypertonia b/l LE left greater than right, 3-4/4   SENSORY: Altered sensation below T4 level --unchanged    Assessment/Plan: 1. Functional deficits which require 3+ hours per day of interdisciplinary therapy in a comprehensive inpatient rehab setting. Physiatrist is providing close team supervision and 24 hour management of active medical problems listed below. Physiatrist and rehab team continue to assess barriers to discharge/monitor patient progress toward functional and medical goals  Care Tool:  Bathing    Body parts bathed by patient: Right arm, Left arm, Chest, Abdomen, Front perineal area, Face   Body parts bathed by helper: Buttocks, Right upper leg, Right lower leg, Left lower leg, Left upper leg     Bathing assist Assist Level:  Maximal Assistance - Patient 24 - 49%     Upper Body Dressing/Undressing Upper body dressing   What is the patient wearing?: Pull over shirt    Upper body assist Assist Level: Moderate Assistance - Patient 50 - 74%    Lower Body Dressing/Undressing Lower body dressing      What is the patient wearing?: Pants, Incontinence brief     Lower body assist Assist for lower body dressing: Dependent - Patient 0%     Toileting Toileting    Toileting assist Assist for toileting: 2 Helpers     Transfers Chair/bed transfer  Transfers assist  Chair/bed transfer activity did not occur: Safety/medical concerns  Chair/bed transfer assist level: 2 Helpers     Locomotion Ambulation   Ambulation assist   Ambulation activity did not occur: Safety/medical concerns (spasticity; weakness)          Walk 10 feet activity   Assist  Walk 10 feet activity did not occur: Safety/medical concerns        Walk 50 feet activity   Assist Walk 50 feet with 2 turns activity did not occur: Safety/medical concerns         Walk 150 feet activity   Assist Walk 150 feet activity did not occur: Safety/medical concerns         Walk 10 feet on uneven surface  activity   Assist Walk 10 feet on uneven surfaces activity did not occur: Safety/medical concerns         Wheelchair     Assist Is the patient using a wheelchair?: Yes Type of Wheelchair: Power Wheelchair activity did not occur: Safety/medical concerns (w/c malfunction)         Wheelchair 50 feet with 2 turns activity    Assist    Wheelchair 50 feet with 2 turns activity did not occur: Safety/medical concerns       Wheelchair 150 feet activity     Assist  Wheelchair 150 feet activity did not occur: Safety/medical concerns       Blood pressure 113/71, pulse 97, temperature 98.6 F (37 C), temperature source Oral, resp. rate 18, height 5\' 9"  (1.753 m), weight 89.9 kg, SpO2 100%.  Medical  Problem List and Plan: 1. Functional deficits secondary to Cervical myelopathy s/p ACDF and PCDF C6-T1 1/17 by Dr. Jake Samples             -patient may shower, please cover incision             -ELOS/Goals: 14-16, Sup to min A             -Admit to CIR   -Con't CIR PT and OT  Team conference today to determine length of stay- set for 2/6- changed to  15/7  Still a difficult balance between spasticity and tolerance of meds  2.  Antithrombotics: -DVT/anticoagulation:  Pharmaceutical: Lovenox             -antiplatelet therapy: N/A 3. Pain Management:  Tylenol 650 mg qid. Continue MS contin  15mg  w/supper and oxycodone 5mg  prn  1/23- pain mainly from spasms, but also now has new neck pain from posterior fusion- con't regimen  1/26- main spasms from spasticity- neck pain minor per pt 4. Mood/Behavior/Sleep: LCSW to follow for evaluation and support.              --was on Seroquel for sleep- restart              -Restart cymbalta at 30mg  daily             -antipsychotic agents: Seroquel for confusion- on 25-50 mg at bedtime- was on 75 mg at bedtime bfore, will see how does.   1/25- more sleepy this AM- slept well- but could be meds again- will see if can reduce Valium long term 5. Neuropsych/cognition: This patient is intermittently  capable of making decisions on his own behalf. 6. Skin/Wound Care: Routine pressure relief measures.              --surgical dressing to stay in place with office follow up per d/c summary.  7. Fluids/Electrolytes/Nutrition: Monitor I/O. Check CMET in am             -Continue vitamin C, prosource 8. Spasticity: Continue Valium 5 mg qid, baclofen 20 mg qid, Zanaflex 4 mg every 6 hours prn.    1/23- spasms still a major issue- will monitor for other options. Baclofen has been decreased from prior dose of 30 mg q6 hours- will see if need to increase again. 1/24- d/w pt/fiance- will increase Baclofen to 30 mg q6 hours. 1/25- will add dantrolene back tomorrow-if tolerates and  spasticity improves, then maybe can reduce Valium.   1/27 spasticity on going -see ID discussion below - k Dantrolene 25 mg at bedtime and titrate up q5 days per Dr. Elbert Ewings -  Baclofen 10 mg QID prn has been added for severe spasms not controlled with other regimen 9. Neurogenic bladder: Cath every 4 hours to keep bladder volumes < 350 cc. Continue flomax 0.4mg              --1/23-1/25con't cathing, no matter cath volumes 5x/day 10. Neurogenic bowel: Was not on bowel program. Last BM documented on 01/18 --Will change senna to am, continue miralax in am.  --Schedule mini enema at nights.  -Sorbitol scheduled tomorrow    1/23- hasn't had good BM's on acute, so will do sorbitol today.   1/24- had good BM last night with bowel program  1/25- didn't have any results last night- will give sorbitol to get results tonight and increase Senna to 3 tabs daily- already on Miralax daily. 1/26- NO BM with bowel program again last night- will give Sorbitol 60cc and do mini enema tonight if no BM today. Constipation is likely why spasticity is worse.    1/27 had large bm yesterday morning  1/28- LBM tonight- medium 11. Fever:  -WBC's up to 11k, Tmx 102.5  -Lactate 2.2  -cxr clear, ua, ucx pending. No obvious associated sx other than spasticity perhaps  -begin empiric vanc and zosyn to cover surgical site/bacteremia  -1/23 dopplers clean  -reach out to NS regarding potential follow up imaging of neck  1/28- ON Zosyn- U/A mildly (+) but  had >50 WBCs, so most likely source- Spasticity already improving.Cathing q3 hours for now since on IVFs for now- and on OV ABX- but can go back to q4 hours after off IVF/IV ABX. Cx is pending- shows 60k Klebsiella- will monitor for final Cx.   12. Seroma?  1/28- monitor seroma on posterior neck daily to make sure gets better.   I spent a total of 56   minutes on total care today- >50% coordination of care- due to  Prlonged time in room with pt and nursing and fiance- then also  spoke with nursing about cathing and bowels- then team conference- also reviewed labs- asked pt to wear L prevalon to sleep. To stop pressure on L lateral malleolus- where very large skin scab is flaking off- cannot see well underneath.   LOS: 6 days A FACE TO FACE EVALUATION WAS PERFORMED  Paytin Ramakrishnan 07/23/2023, 7:21 PM

## 2023-07-24 LAB — CBC WITH DIFFERENTIAL/PLATELET
Abs Immature Granulocytes: 0.03 10*3/uL (ref 0.00–0.07)
Basophils Absolute: 0.1 10*3/uL (ref 0.0–0.1)
Basophils Relative: 1 %
Eosinophils Absolute: 0.1 10*3/uL (ref 0.0–0.5)
Eosinophils Relative: 1 %
HCT: 28.2 % — ABNORMAL LOW (ref 39.0–52.0)
Hemoglobin: 9.8 g/dL — ABNORMAL LOW (ref 13.0–17.0)
Immature Granulocytes: 0 %
Lymphocytes Relative: 20 %
Lymphs Abs: 1.6 10*3/uL (ref 0.7–4.0)
MCH: 33.4 pg (ref 26.0–34.0)
MCHC: 34.8 g/dL (ref 30.0–36.0)
MCV: 96.2 fL (ref 80.0–100.0)
Monocytes Absolute: 0.7 10*3/uL (ref 0.1–1.0)
Monocytes Relative: 9 %
Neutro Abs: 5.3 10*3/uL (ref 1.7–7.7)
Neutrophils Relative %: 69 %
Platelets: 422 10*3/uL — ABNORMAL HIGH (ref 150–400)
RBC: 2.93 MIL/uL — ABNORMAL LOW (ref 4.22–5.81)
RDW: 12.5 % (ref 11.5–15.5)
WBC: 7.8 10*3/uL (ref 4.0–10.5)
nRBC: 0 % (ref 0.0–0.2)

## 2023-07-24 LAB — BASIC METABOLIC PANEL
Anion gap: 11 (ref 5–15)
BUN: 8 mg/dL (ref 6–20)
CO2: 24 mmol/L (ref 22–32)
Calcium: 9.2 mg/dL (ref 8.9–10.3)
Chloride: 101 mmol/L (ref 98–111)
Creatinine, Ser: 0.99 mg/dL (ref 0.61–1.24)
GFR, Estimated: >60 mL/min (ref >60)
Glucose, Bld: 103 mg/dL — ABNORMAL HIGH (ref 70–99)
Potassium: 3.8 mmol/L (ref 3.5–5.1)
Sodium: 136 mmol/L (ref 135–145)

## 2023-07-24 LAB — URINE CULTURE: Culture: 60000 — AB

## 2023-07-24 MED ORDER — SODIUM CHLORIDE 0.9 % IV SOLN
1.0000 g | Freq: Three times a day (TID) | INTRAVENOUS | Status: AC
  Administered 2023-07-24 – 2023-07-26 (×8): 1 g via INTRAVENOUS
  Filled 2023-07-24 (×11): qty 10

## 2023-07-24 MED ORDER — ENSURE ENLIVE PO LIQD
237.0000 mL | Freq: Two times a day (BID) | ORAL | Status: DC
  Administered 2023-07-24 – 2023-08-01 (×14): 237 mL via ORAL

## 2023-07-24 MED ORDER — MEDIHONEY WOUND/BURN DRESSING EX PSTE
1.0000 | PASTE | Freq: Every day | CUTANEOUS | Status: DC
  Administered 2023-07-24 – 2023-07-31 (×8): 1 via TOPICAL
  Filled 2023-07-24: qty 44

## 2023-07-24 MED ORDER — DANTROLENE SODIUM 25 MG PO CAPS
25.0000 mg | ORAL_CAPSULE | Freq: Two times a day (BID) | ORAL | Status: DC
  Administered 2023-07-24 – 2023-07-29 (×11): 25 mg via ORAL
  Filled 2023-07-24 (×11): qty 1

## 2023-07-24 NOTE — Progress Notes (Signed)
Physical Therapy Session Note  Patient Details  Name: Leon Rodriguez MRN: [id number] Date of Birth: 01-06-1966  Today's Date: 07/24/2023 PT Individual Time: 4098-1191 PT Individual Time Calculation (min): 30 min   Short Term Goals: Week 1:  PT Short Term Goal 1 (Week 1): Pt will require max A with rolling in bilateral directions PT Short Term Goal 2 (Week 1): Pt will direct caregiver with dependent transfer set-up PT Short Term Goal 3 (Week 1): Pt will require min A with power wheelchair mobility 50 ft  Skilled Therapeutic Interventions/Progress Updates: Pt presented in bed with SO and family friend present agreeable to therapy. Pt agreeable to OOB. With increased time pt was able to complete supine to sit with heavy use of bed features and minA for LLE management. At EOB cues for anterior lean which allowed PTA to block and space BLE to minimize crossing during spasm. Pt was able to complete lateral lean to R to allow PTA to place Slide board. Per pt request bed was elevated slightly above PWC level.  Pt then compel ted Slide board transfer with light minA for facilitation of anterior lean nearing minA. Pt then navigated to main gym and participated in chest press and overhead press with 4lb weighted ball to fatigue. Pt noted some increase in tingling in RUE however resolved with rest. Pt was also able to boost self in [facility] for better alignment. Pt navigated back to room in same manner as prior and pt was able to navigate and align in bed with supervision. Pt left in PWC at end of session with SO and friends present and needs met.      Therapy Documentation Precautions:  Precautions Precautions: Fall, Cervical Precaution Comments: severe muscle spasms, hard collar for comfort Required Braces or Orthoses: Cervical Brace Cervical Brace: Hard collar Restrictions Weight Bearing Restrictions Per Provider Order: No Other Position/Activity Restrictions: per [name] PA, Pt can wear cervical  brace PRN as on 1/20 General:   Vital Signs: Therapy Vitals Temp: 97.7 F (36.5 C) Temp Source: Oral Pulse Rate: 89 Resp: 16 BP: (!) 102/46 Patient Position (if appropriate): Sitting Oxygen Therapy SpO2: 100 % O2 Device: Room Air     Therapy/Group: Individual Therapy  Jayci Ellefson 07/24/2023, 4:49 PM

## 2023-07-24 NOTE — Progress Notes (Signed)
Physical Therapy Session Note  Patient Details  Name: Leon Rodriguez MRN: [id number] Date of Birth: [birth year]  Today's Date: 07/24/2023 PT Individual Time: 1104-1200 PT Individual Time Calculation (min): 56 min   Short Term Goals: Week 1:  PT Short Term Goal 1 (Week 1): Pt will require max A with rolling in bilateral directions PT Short Term Goal 2 (Week 1): Pt will direct caregiver with dependent transfer set-up PT Short Term Goal 3 (Week 1): Pt will require min A with power wheelchair mobility 50 ft Week 2:    Week 3:     Skilled Therapeutic Interventions/Progress Updates:      Therapy Documentation Precautions:  Precautions Precautions: Fall, Cervical Precaution Comments: severe muscle spasms, hard collar for comfort Required Braces or Orthoses: Cervical Brace Cervical Brace: Hard collar Restrictions Weight Bearing Restrictions Per Provider Order: No Other Position/Activity Restrictions: per [name] PA, Pt can wear cervical brace PRN as on 1/20 General:   Vital Signs: Therapy Vitals Temp: 97.7 F (36.5 C) Temp Source: Oral Pulse Rate: 89 Resp: 16 BP: (!) 102/46 Patient Position (if appropriate): Sitting Oxygen Therapy SpO2: 100 % O2 Device: Room Air Pain:   Mobility:   Locomotion :    Trunk/Postural Assessment :    Balance:   Exercises:   Other Treatments:      Therapy/Group: Individual Therapy  Loel Dubonnet PT, DPT, CSRS 07/24/2023, 5:03 PM

## 2023-07-24 NOTE — Progress Notes (Signed)
Occupational Therapy Session Note  Patient Details  Name: Leon Rodriguez MRN: [id number] Date of Birth: 01-16-1966  Today's Date: 07/24/2023 OT Individual Time: 0930-1030 OT Individual Time Calculation (min): 60 min    Short Term Goals: Week 1:  OT Short Term Goal 1 (Week 1): Pt will complete bed mobility Mod A in preparation for LB ADLs OT Short Term Goal 2 (Week 1): Pt will complete LB dressing Max A OT Short Term Goal 3 (Week 1): Pt will complete UB dressing SBA  Skilled Therapeutic Interventions/Progress Updates:    Pt sitting EOB upon arrival with SO present providing support. Initial focus on repositioning pt at EOB. Pt initiated anterior scoot seated EOB but required max A to complete task. Lateral lean to Rt with CGA to facilitate TB placement. TB transfer to [facility] with mod A and +2 to stabilize TB during transfer. Pt required max A for repositioning hips in PWC. Adjustments made to head rest on PWC. Reviewed pressure relief schedule and pt performed task with supervision. Recommended setting timer on phone to remind him when to perform pressure relief. Discussed improtance of pressure relief. Pt with several BLE spasms relieved with stretching. Pt returned to room and remained in PWC with SO present.   Therapy Documentation Precautions:  Precautions Precautions: Fall, Cervical Precaution Comments: severe muscle spasms, hard collar for comfort Required Braces or Orthoses: Cervical Brace Cervical Brace: Hard collar Restrictions Weight Bearing Restrictions Per Provider Order: No Other Position/Activity Restrictions: per [name] PA, Pt can wear cervical brace PRN as on 1/20   Pain:  Ongoing BLE spasms; repositioning and gentle stretching   Therapy/Group: Individual Therapy  Rich Brave 07/24/2023, 11:47 AM

## 2023-07-24 NOTE — Plan of Care (Signed)
Wound Plan  Wounds present:   06/21/23 Other (Comment) Ankle Left;Posterior;Lateral Blister   07/17/23 Heel Left Stage 2 - Partial thickness loss of dermis presenting as a shallow open injury with a red, pink wound bed without slough. blister that has ruptured with drainage   Interventions:   Vit  C daily Prosource plus liquid twice daily ensure enlive twice daily Prevalon boot bilateral when in bed [place] honey to the wound daily foam dressing left heel stage 2 Standard skin care orders Regular diet eating 50-75% of meals  Braden Score: 15 Sensory: 3 Moisture: 3 Activity: 2 Mobility: 2 Nutrition: 3 Friction: 2  Contributors Ily Denno RN-BC, WTA

## 2023-07-24 NOTE — Patient Care Conference (Signed)
Inpatient RehabilitationTeam Conference and Plan of Care Update Date: 07/23/2023   Time:  1121am   Patient Name: Leon Rodriguez      Medical Record Number: [id number]  Date of Birth: [birth year] Sex: Male         Room/Bed: 4W20C/4W20C-01 Payor Info: Payor: [place] MEDICAID PREPAID HEALTH PLAN / Plan: [place] MEDICAID HEALTHY BLUE / Product Type: *No Product type* /    Admit Date/Time:  07/17/2023  4:19 PM  Primary Diagnosis:  Cervical myelopathy [facility])  Hospital Problems: Principal Problem:   Cervical myelopathy [name])    Expected Discharge Date: Expected Discharge Date: 08/01/23  Team Members Present: Physician leading conference: Dr. Genice Rouge Social Worker Present: [name], LCSWA Nurse Present: Chana Bode, Janina Mayo, RN PT Present: Bernie Covey, PT OT Present: Ardis Rowan, Joline Salt, OT SLP Present: Pablo Lawrence, SLP     Current Status/Progress Goal Weekly Team Focus  Bowel/Bladder   q4 i/o cath and bowel program. LBM 1/27 with wife performing bowel program and caths during the day. staff still doing caths over night   patient and family will understand the importance of daily bowel program   provide education to family concerning bowel program daily even when returning home    Swallow/Nutrition/ Hydration               ADL's   SO currently completing BADLs at bed level; TB transfers with mod A +2 for safety; ongoing spasmn/felxor/extensor tone; education ongoing with SO   mod A overall   TB transfers, bed monbility, educatoin    Mobility   limited by tone, family education ongoing for hoyer, SBT with mod-max, +2 standing by   mod A  transfers, family ed, tone management    Communication                Safety/Cognition/ Behavioral Observations               Pain   only pain is from muscle spasms   control muscle spasms   assess pain qshift and PRN offering muscle relaxers PRN and ensuring schedule medications are given.     Skin   stage 2 left heel  Seroma filled fluid was noted on his neck  promote healing  assess skin qshift and perform dressing changes as ordered and offloading  Consult done     Discharge Planning:  D/c to home with s/o [name]. She is the only caregiver. Reports her dtr can help PRN some evenings. Disability referral sent to [facility]. [facility] referral sent from previous admission to insurance- family needs to follow-up to schedule assessment. [facility] for HHPT/OT/aide. Will confirm DME needs. SW will confirm there are no barriers to discharge.   Team Discussion: Patient admitted post  cervical myelopathy ACDF C6-T1 with increasing spasticity and was diagnosed with urinary tract infection; currently on antibiotics.    Patient on target to meet rehab goals: no, spasticity affecting patient's ability to complete ADLS and transfers. Currently, completes ADLS at bed level. Over all goals are set for MOD I.  *See Care Plan and progress notes for long and short-term goals.   Revisions to Treatment Plan:  Changed focus of goals to care giving Changed to 15/7 treatment schedule  Power wheelchair evaluation Cathing schedule was changed to every 3 hours per day.   Teaching Needs: Skin care , Medications, transfers, safety, etc.   Current Barriers to Discharge: Decreased care giver support Neurogenic bowel and bladder  Possible Resolutions to Barriers:  Family education Disability referral pending [facility] referral [facility] health follow-up DME: hoyer lift; Drop arm , BSC; hospital bed     Medical Summary Current Status: pt s/p posterior fusion- for  incomplete quad- neurogenic bowel and bladder- has UTI- plan for shower when off IV ABX  Barriers to Discharge: Behavior/Mood;Incontinence;Complicated Wound;Medical stability;Neurogenic Bowel & Bladder;Renal Insufficiency/Failure;Spasticity;Weight bearing restrictions;Uncontrolled Pain;Self-care education  Barriers to Discharge  Comments: UTI- on IV ABX- ;imited by spasticity, pain; and weakness Possible Resolutions to [employer] Focus: needs hoyer lift, hospital bed and power w/c- getting w/c eval- d/c 08/01/23   Continued Need for Acute Rehabilitation Level of Care: The patient requires daily medical management by a physician with specialized training in physical medicine and rehabilitation for the following reasons: Direction of a multidisciplinary physical rehabilitation program to maximize functional independence : Yes Medical management of patient stability for increased activity during participation in an intensive rehabilitation regime.: Yes Analysis of laboratory values and/or radiology reports with any subsequent need for medication adjustment and/or medical intervention. : Yes   I attest that I was present, lead the team conference, and concur with the assessment and plan of the team.   Gwenyth Allegra 07/24/2023, 1:55 PM

## 2023-07-24 NOTE — Progress Notes (Addendum)
PROGRESS NOTE   Subjective/Complaints:  Pt reports spasms have been better although they are limiting and if his legs are touched, spasms will kick off. He says he's tolerating spasticity medications without issues presently.  ROS: Patient denies fever, rash, sore throat, blurred vision, dizziness, nausea, vomiting, diarrhea, cough, shortness of breath or chest pain, joint or back/neck pain, headache, or mood change.   Objective:   No results found.   Recent Labs    07/22/23 0636 07/24/23 0528  WBC 11.2* 7.8  HGB 11.3* 9.8*  HCT 33.0* 28.2*  PLT 449* 422*    Recent Labs    07/22/23 0636 07/24/23 0528  NA 133* 136  K 4.1 3.8  CL 97* 101  CO2 23 24  GLUCOSE 109* 103*  BUN 9 8  CREATININE 0.65 0.99  CALCIUM 9.4 9.2     Intake/Output Summary (Last 24 hours) at 07/24/2023 4540 Last data filed at 07/24/2023 0600 Gross per 24 hour  Intake 240 ml  Output 2270 ml  Net -2030 ml     Pressure Injury 07/17/23 Heel Left Stage 2 -  Partial thickness loss of dermis presenting as a shallow open injury with a red, pink wound bed without slough. blister that has ruptured with drainage (Active)  07/17/23 (as per nurse Toni Amend) 1700  Location: Heel  Location Orientation: Left  Staging: Stage 2 -  Partial thickness loss of dermis presenting as a shallow open injury with a red, pink wound bed without slough.  Wound Description (Comments): blister that has ruptured with drainage  Present on Admission: Yes    Physical Exam: Vital Signs Blood pressure 99/65, pulse 88, temperature 98.9 F (37.2 C), temperature source Oral, resp. rate 18, height 5\' 9"  (1.753 m), weight 89.9 kg, SpO2 99%.       Constitutional: No distress . Vital signs reviewed. HEENT: NCAT, EOMI, oral membranes moist Neck: supple Cardiovascular: RRR without murmur. No JVD    Respiratory/Chest: CTA Bilaterally without wheezes or rales. Normal effort     GI/Abdomen: BS +, non-tender, non-distended Ext: no clubbing, cyanosis, or edema Psych: pleasant and cooperative  Skin: swelling around surgical site. Left heel wound pink Neurological: very alert. Mentation appropriate. Speech clear.  MOTOR: RUE: 5/5 Deltoid, 5/5 Biceps, 5/5 Triceps,5/5 Grip LUE: 5/5 Deltoid, 5/5 Biceps, 5/5 Triceps, 5/5 Grip RLE: HF 2+/5, KE 3/5, ADF 4/5, APF 4/5 LLE: HF 2/5, KE 2/5, ADF 2/5, APF 2/5- difficult to test due to increased tone   Severe hypertonia b/l LE left greater than right, 3-4/4--spasticity triggers with simple attempts at PROM.   SENSORY: Altered sensation below T4 level --unchanged    Assessment/Plan: 1. Functional deficits which require 3+ hours per day of interdisciplinary therapy in a comprehensive inpatient rehab setting. Physiatrist is providing close team supervision and 24 hour management of active medical problems listed below. Physiatrist and rehab team continue to assess barriers to discharge/monitor patient progress toward functional and medical goals  Care Tool:  Bathing    Body parts bathed by patient: Right arm, Left arm, Chest, Abdomen, Front perineal area, Face   Body parts bathed by helper: Buttocks, Right upper leg, Right lower leg, Left lower leg, Left upper  leg     Bathing assist Assist Level: Maximal Assistance - Patient 24 - 49%     Upper Body Dressing/Undressing Upper body dressing   What is the patient wearing?: Pull over shirt    Upper body assist Assist Level: Moderate Assistance - Patient 50 - 74%    Lower Body Dressing/Undressing Lower body dressing      What is the patient wearing?: Pants, Incontinence brief     Lower body assist Assist for lower body dressing: Dependent - Patient 0%     Toileting Toileting    Toileting assist Assist for toileting: 2 Helpers     Transfers Chair/bed transfer  Transfers assist  Chair/bed transfer activity did not occur: Safety/medical concerns  Chair/bed  transfer assist level: 2 Helpers     Locomotion Ambulation   Ambulation assist   Ambulation activity did not occur: Safety/medical concerns (spasticity; weakness)          Walk 10 feet activity   Assist  Walk 10 feet activity did not occur: Safety/medical concerns        Walk 50 feet activity   Assist Walk 50 feet with 2 turns activity did not occur: Safety/medical concerns         Walk 150 feet activity   Assist Walk 150 feet activity did not occur: Safety/medical concerns         Walk 10 feet on uneven surface  activity   Assist Walk 10 feet on uneven surfaces activity did not occur: Safety/medical concerns         Wheelchair     Assist Is the patient using a wheelchair?: Yes Type of Wheelchair: Power Wheelchair activity did not occur: Safety/medical concerns (w/c malfunction)         Wheelchair 50 feet with 2 turns activity    Assist    Wheelchair 50 feet with 2 turns activity did not occur: Safety/medical concerns       Wheelchair 150 feet activity     Assist  Wheelchair 150 feet activity did not occur: Safety/medical concerns       Blood pressure 99/65, pulse 88, temperature 98.9 F (37.2 C), temperature source Oral, resp. rate 18, height 5\' 9"  (1.753 m), weight 89.9 kg, SpO2 99%.  Medical Problem List and Plan: 1. Functional deficits secondary to Cervical myelopathy s/p ACDF and PCDF C6-T1 1/17 by Dr. Jake Samples             -patient may shower, please cover incision             -ELOS/Goals: 2/6, Sup to min A               -Continue CIR therapies including PT, OT   - changed to 15/7  Still a difficult balance between spasticity and tolerance of meds  2.  Antithrombotics: -DVT/anticoagulation:  Pharmaceutical: Lovenox             -antiplatelet therapy: N/A 3. Pain Management:  Tylenol 650 mg qid. Continue MS contin  15mg  w/supper and oxycodone 5mg  prn  1/23- pain mainly from spasms, but also now has new neck pain  from posterior fusion- con't regimen  1/26- main spasms from spasticity- neck pain minor per pt 4. Mood/Behavior/Sleep: LCSW to follow for evaluation and support.              --was on Seroquel for sleep- restart              -Restart cymbalta at 30mg  daily             -  antipsychotic agents: Seroquel for confusion- on 25-50 mg at bedtime- was on 75 mg at bedtime bfore, will see how does.     5. Neuropsych/cognition: This patient is intermittently  capable of making decisions on his own behalf. 6. Skin/Wound Care: Routine pressure relief measures.              --surgical dressing to stay in place with office follow up per d/c summary.  7. Fluids/Electrolytes/Nutrition: Monitor I/O. Check CMET in am             -Continue vitamin C, prosource 8. Spasticity: Continue Valium 5 mg qid, baclofen 20 mg qid, Zanaflex 4 mg every 6 hours prn.    1/23- spasms still a major issue- will monitor for other options. Baclofen has been decreased from prior dose of 30 mg q6 hours- will see if need to increase again. 1/24- d/w pt/fiance- will increase Baclofen to 30 mg q6 hours. 1/25- will add dantrolene back tomorrow-if tolerates and spasticity improves, then maybe can reduce Valium.   1/27 spasticity on going -see ID discussion below - k Dantrolene 25 mg at bedtime and titrate up q5 days per Dr. Elbert Ewings -  Baclofen 10 mg QID prn has been added for severe spasms not controlled with other regimen 1/29 increase dantrium to 25mg  bid. Titrate as tolerated 9. Neurogenic bladder: Cath every 4 hours to keep bladder volumes < 350 cc. Continue flomax 0.4mg              --1/23-1/25con't cathing, no matter cath volumes 5x/day 10. Neurogenic bowel: Was not on bowel program. Last BM documented on 01/18 --Will change senna to am, continue miralax in am.  --Schedule mini enema at nights.  -Sorbitol scheduled tomorrow    1/23- hasn't had good BM's on acute, so will do sorbitol today.   1/24- had good BM last night with bowel  program  1/25- didn't have any results last night- will give sorbitol to get results tonight and increase Senna to 3 tabs daily- already on Miralax daily. 1/26- NO BM with bowel program again last night- will give Sorbitol 60cc and do mini enema tonight if no BM today. Constipation is likely why spasticity is worse.    1/27 had large bm yesterday morning  1/28- LBM tonight- medium 11. Fever:  -now afebrile, spasms better?  1/29 Cx + for 60k Klebsiella- R to zosyn however, WBC's down to 7k -will change to maxipime today--consider change to oral tomorrow -monitor clinically -recheck CBC Thursday per standing order 12. Seroma?  1/28- monitor seroma on posterior neck daily to make sure gets better.      LOS: 7 days A FACE TO FACE EVALUATION WAS PERFORMED  Ranelle Oyster 07/24/2023, 9:28 AM

## 2023-07-25 LAB — BASIC METABOLIC PANEL
Anion gap: 6 (ref 5–15)
BUN: 7 mg/dL (ref 6–20)
CO2: 29 mmol/L (ref 22–32)
Calcium: 9.6 mg/dL (ref 8.9–10.3)
Chloride: 103 mmol/L (ref 98–111)
Creatinine, Ser: 0.68 mg/dL (ref 0.61–1.24)
GFR, Estimated: >60 mL/min (ref >60)
Glucose, Bld: 100 mg/dL — ABNORMAL HIGH (ref 70–99)
Potassium: 3.7 mmol/L (ref 3.5–5.1)
Sodium: 138 mmol/L (ref 135–145)

## 2023-07-25 LAB — CBC
HCT: 30.3 % — ABNORMAL LOW (ref 39.0–52.0)
Hemoglobin: 10.4 g/dL — ABNORMAL LOW (ref 13.0–17.0)
MCH: 32.8 pg (ref 26.0–34.0)
MCHC: 34.3 g/dL (ref 30.0–36.0)
MCV: 95.6 fL (ref 80.0–100.0)
Platelets: 437 10*3/uL — ABNORMAL HIGH (ref 150–400)
RBC: 3.17 MIL/uL — ABNORMAL LOW (ref 4.22–5.81)
RDW: 12.5 % (ref 11.5–15.5)
WBC: 5.9 10*3/uL (ref 4.0–10.5)
nRBC: 0 % (ref 0.0–0.2)

## 2023-07-25 MED ORDER — SORBITOL 70 % SOLN
30.0000 mL | Freq: Once | Status: AC
  Administered 2023-07-25: 30 mL via ORAL
  Filled 2023-07-25: qty 30

## 2023-07-25 MED ORDER — QUETIAPINE FUMARATE 50 MG PO TABS
50.0000 mg | ORAL_TABLET | Freq: Every day | ORAL | Status: DC
  Administered 2023-07-25 – 2023-07-31 (×7): 50 mg via ORAL
  Filled 2023-07-25 (×7): qty 1

## 2023-07-25 NOTE — Plan of Care (Signed)
  Problem: RH Bathing Goal: LTG Patient will bathe all body parts with assist levels (OT) Description: LTG: Patient will bathe all body parts with assist levels (OT) Flowsheets (Taken 07/25/2023 1516) LTG: Pt will perform bathing with assistance level/cueing: (downgraded goal JLS) Moderate Assistance - Patient 50 - 74% Note: Downgraded JLS   Problem: RH Dressing Goal: LTG Patient will perform lower body dressing w/assist (OT) Description: LTG: Patient will perform lower body dressing with assist, with/without cues in positioning using equipment (OT) Outcome: Not Applicable Flowsheets (Taken 07/25/2023 1516) LTG: Pt will perform lower body dressing with assistance level of: (d/c goal JLS) -- Note: d/c goal JLS    Problem: RH Toileting Goal: LTG Patient will perform toileting task (3/3 steps) with assistance level (OT) Description: LTG: Patient will perform toileting task (3/3 steps) with assistance level (OT)  Outcome: Not Applicable Flowsheets (Taken 07/25/2023 1516) LTG: Pt will perform toileting task (3/3 steps) with assistance level: (d/c goal JLS) -- Note: d/c goal JLS    Problem: RH Toilet Transfers Goal: LTG Patient will perform toilet transfers w/assist (OT) Description: LTG: Patient will perform toilet transfers with assist, with/without cues using equipment (OT) Flowsheets (Taken 07/25/2023 1516) LTG: Pt will perform toilet transfers with assistance level of: (d/c goal JLS) -- Note: d/c goal JLS    Problem: RH Tub/Shower Transfers Goal: LTG Patient will perform tub/shower transfers w/assist (OT) Description: LTG: Patient will perform tub/shower transfers with assist, with/without cues using equipment (OT) Outcome: Not Applicable Flowsheets (Taken 07/25/2023 1516) LTG: Pt will perform tub/shower stall transfers with assistance level of: (d/c goal) -- Note: D/c goal

## 2023-07-25 NOTE — Progress Notes (Signed)
Occupational Therapy Weekly Progress Note  Patient Details  Name: Leon Rodriguez MRN: [id number] Date of Birth: 1965/08/24  Beginning of progress report period: July 18, 2023 End of progress report period: July 25, 2023  Patient has met 2 of 3 short term goals.  Pt progress with bed mobility and TB transfers has been steady over the past week. Pt's SO assists pt with all BADLs at bed level and will continue to provide assistance at discharge. Bed mobility (supine>sit EOB) with mod A. Sitting balance EOB with min A in preparation for TB transfers to [name] with mod A +2 for safety. LTG downgraded.   Patient continues to demonstrate the following deficits: muscle weakness, decreased cardiorespiratoy endurance, impaired timing and sequencing, abnormal tone, unbalanced muscle activation, and decreased coordination, and decreased sitting balance, decreased standing balance, decreased postural control, and decreased balance strategies and therefore will continue to benefit from skilled OT intervention to enhance overall performance with BADL and Reduce care partner burden.  Patient not progressing toward long term goals.  See goal revision..  Continue plan of care.  OT Short Term Goals Week 1:  OT Short Term Goal 1 (Week 1): Pt will complete bed mobility Mod A in preparation for LB ADLs OT Short Term Goal 1 - Progress (Week 1): Met OT Short Term Goal 2 (Week 1): Pt will complete LB dressing Max A OT Short Term Goal 2 - Progress (Week 1): Progressing toward goal OT Short Term Goal 3 (Week 1): Pt will complete UB dressing SBA OT Short Term Goal 3 - Progress (Week 1): Met Week 2:  OT Short Term Goal 1 (Week 2): STG=LTG (downgraded) 2/2 ELOS   Rich Brave 07/25/2023, 6:50 AM

## 2023-07-25 NOTE — Progress Notes (Signed)
IP Rehab Bowel Program Documentation   Bowel Program Start time 2123  Dig Stim Indicated? Yes  Dig Stim Prior to Suppository or mini Enema X 1   Output from dig stim: Minimal- (NONE-smear)  Ordered intervention: Suppository No , mini enema Yes ,   Repeat dig stim after Suppository or Mini enema  X 2,  Output? Minimal(NONE-smear) Bowel Program Complete? Yes , handoff given NO  Patient Tolerated? Yes    Patient did not have a bowel movement after dig stim performed. Rectal vault was clear. Patient and patient's wife reports that patient had a large brown soft bowel movement around 1100 am( after physical therapy) today.

## 2023-07-25 NOTE — Progress Notes (Signed)
Per significant other--has not been sleeping at night at all. Has been getting only 25 of Seroquel--will schedule 50 mg at bedtime and monitor for effectiveness.

## 2023-07-25 NOTE — Progress Notes (Signed)
Occupational Therapy Session Note  Patient Details  Name: Leon Rodriguez MRN: [id number] Date of Birth: 03/22/1966  Today's Date: 07/25/2023 OT Individual Time: 0930-1040 OT Individual Time Calculation (min): 70 min    Short Term Goals: Week 2:  OT Short Term Goal 1 (Week 2): STG=LTG (downgraded) 2/2 ELOS  Skilled Therapeutic Interventions/Progress Updates:    OT intervention with focus on bed mobility, TB transfers, and sitting balance to increase pt's ability to assist with self care tasks and decrease burden on caregiver. TB transfers with mod A and +2 for safety. Sitting balance EOM during supported and unsupported activities with 2# weight bar. Pt with increased spasms when engaging core for unsupported sitting balance. PWC mobility with supervision. Pt remained in PWC and returned to room. SO present. All needs within reach.   Therapy Documentation Precautions:  Precautions Precautions: Fall, Cervical Precaution Comments: severe muscle spasms, hard collar for comfort Required Braces or Orthoses: Cervical Brace Cervical Brace: Hard collar Restrictions Weight Bearing Restrictions Per Provider Order: No Other Position/Activity Restrictions: per [name] PA, Pt can wear cervical brace PRN as on 1/20   Pain: Ongoing BLE spasms at rest and during activity; rest as appropriate and stretching   Therapy/Group: Individual Therapy  Rich Brave 07/25/2023, 10:44 AM

## 2023-07-25 NOTE — Progress Notes (Signed)
Occupational Therapy Session Note  Patient Details  Name: RAESHAUN SIMSON MRN: [id number] Date of Birth: [birth year]  Today's Date: 07/26/2023 OT Individual Time: 1417-1535 OT Individual Time Calculation (min): 78 min    Short Term Goals: Week 1:  OT Short Term Goal 1 (Week 2): STG=LTG (downgraded) 2/2 ELOS  Skilled Therapeutic Interventions/Progress Updates:    Patient agreeable to participate in OT session. Reports 0/10 pain level.   Patient participated in skilled OT session in preparation for discharge home, focusing on directing care and improving independence with transfers and mobility.  - Pt engaged in bed mobility tasks, working transitioning from supine to sit and positioning to facilitate UB dressing. Pt required Mod A for bed mobility, Min A for UB dressing. - Pt practiced functional transfers using slide board, including bed to TIS chair and TIS chair to high-low mat table. Pt required Mod-Max X2 for lateral scoot transfer using slide board.  - Utilized sheet wrap technique while seated on mat table to provide tactile cueing for anterior pelvic tilt and prevent right knee extension due to muscle spasms. Use of sheet wrap technique was effective in promoting proper pelvic positioning and managing right knee extension.  - Pt directed care to therapist and rehab tech, with min VC/prompts as needed, demonstration ability to communicate needs and preferences for safe transfer assistance.   Continued focus is needed on trunk control to improve pt finding center of gravity and increase body awareness.    Therapy Documentation Precautions:  Precautions Precautions: Fall, Cervical Precaution Comments: severe muscle spasms, hard collar for comfort Required Braces or Orthoses: Cervical Brace Cervical Brace: Hard collar Restrictions Weight Bearing Restrictions Per Provider Order: No Other Position/Activity Restrictions: per [name] PA, Pt can wear cervical brace PRN as on  1/20   Therapy/Group: Individual Therapy  Limmie Patricia, OTR/L,CBIS  Supplemental OT - [facility] and [facility] Secure Chat Preferred   07/26/2023, 8:02 AM

## 2023-07-25 NOTE — Progress Notes (Signed)
Occupational Therapy Session Note  Patient Details  Name: Leon Rodriguez MRN: [id number] Date of Birth: 06/07/1966  Today's Date: 07/25/2023 OT Individual Time: 1400-1430 OT Individual Time Calculation (min): 30 min    Short Term Goals: Week 2:  OT Short Term Goal 1 (Week 2): STG=LTG (downgraded) 2/2 ELOS  Skilled Therapeutic Interventions/Progress Updates:    Pt resting in bed upon arrival with LPN and pt's brother present. OT intervention with focus on bed mobility and BLE stretching/myofascial release for spasms mgmt. Rolling R/L in bed with min A and pt using bed rails to assist. Pt improving with bed mobility. Pt with ongoing spasms with increased sensitivity to tactile input on LLE quads. Gentle stretching and massage (MFR) for mgmt. Pt remained in bed with all needs wihtin reach. Discussed plans for taking shower on Monday.   Therapy Documentation Precautions:  Precautions Precautions: Fall, Cervical Precaution Comments: severe muscle spasms, hard collar for comfort Required Braces or Orthoses: Cervical Brace Cervical Brace: Hard collar Restrictions Weight Bearing Restrictions Per Provider Order: No Other Position/Activity Restrictions: per [name] PA, Pt can wear cervical brace PRN as on 1/20 General:   Vital Signs:   Pain:  Ongoing BLE spasms; repositioning and gentle stretching     Therapy/Group: Individual Therapy  Rich Brave 07/25/2023, 2:40 PM

## 2023-07-25 NOTE — Progress Notes (Signed)
PROGRESS NOTE   Subjective/Complaints:  Feels a little sleepy. Spasms about the same. Did rest last night.   ROS: Patient denies fever, rash, sore throat, blurred vision, dizziness, nausea, vomiting, diarrhea, cough, shortness of breath or chest pain, headache, or mood change.   Objective:   No results found.   Recent Labs    07/24/23 0528 07/25/23 0555  WBC 7.8 5.9  HGB 9.8* 10.4*  HCT 28.2* 30.3*  PLT 422* 437*    Recent Labs    07/24/23 0528 07/25/23 0555  NA 136 138  K 3.8 3.7  CL 101 103  CO2 24 29  GLUCOSE 103* 100*  BUN 8 7  CREATININE 0.99 0.68  CALCIUM 9.2 9.6     Intake/Output Summary (Last 24 hours) at 07/25/2023 0932 Last data filed at 07/25/2023 0900 Gross per 24 hour  Intake 800 ml  Output 2625 ml  Net -1825 ml         Physical Exam: Vital Signs Blood pressure 112/69, pulse 90, temperature 98.6 F (37 C), temperature source Oral, resp. rate 18, height 5\' 9"  (1.753 m), weight 89.9 kg, SpO2 100%.       Constitutional: No distress . Vital signs reviewed. HEENT: NCAT, EOMI, oral membranes moist Neck: supple Cardiovascular: RRR without murmur. No JVD    Respiratory/Chest: CTA Bilaterally without wheezes or rales. Normal effort    GI/Abdomen: BS +, non-tender, non-distended Ext: no clubbing, cyanosis, or edema Psych: pleasant and cooperative  Skin: swelling around surgical site. Left heel wound pink with central area open. healing Neurological: very alert. Mentation appropriate. Speech clear.  MOTOR: RUE: 5/5 Deltoid, 5/5 Biceps, 5/5 Triceps,5/5 Grip LUE: 5/5 Deltoid, 5/5 Biceps, 5/5 Triceps, 5/5 Grip RLE: HF 2+/5, KE 3/5, ADF 4/5, APF 4/5 LLE: HF 2/5, KE 2/5, ADF 2/5, APF 2/5- remains hard to test d/t tone   Severe hypertonia b/l LE left greater than right, 3-4/4--spasticity triggers with simple attempts at PROM. Heel cords can be stretched to neutral   SENSORY: Altered  sensation below T4 level --unchanged    Assessment/Plan: 1. Functional deficits which require 3+ hours per day of interdisciplinary therapy in a comprehensive inpatient rehab setting. Physiatrist is providing close team supervision and 24 hour management of active medical problems listed below. Physiatrist and rehab team continue to assess barriers to discharge/monitor patient progress toward functional and medical goals  Care Tool:  Bathing    Body parts bathed by patient: Right arm, Left arm, Chest, Abdomen, Front perineal area, Face   Body parts bathed by helper: Buttocks, Right upper leg, Right lower leg, Left lower leg, Left upper leg     Bathing assist Assist Level: Maximal Assistance - Patient 24 - 49%     Upper Body Dressing/Undressing Upper body dressing   What is the patient wearing?: Pull over shirt    Upper body assist Assist Level: Moderate Assistance - Patient 50 - 74%    Lower Body Dressing/Undressing Lower body dressing      What is the patient wearing?: Pants, Incontinence brief     Lower body assist Assist for lower body dressing: Dependent - Patient 0%     Toileting Toileting  Toileting assist Assist for toileting: 2 Helpers     Transfers Chair/bed transfer  Transfers assist  Chair/bed transfer activity did not occur: Safety/medical concerns  Chair/bed transfer assist level: 2 Helpers     Locomotion Ambulation   Ambulation assist   Ambulation activity did not occur: Safety/medical concerns (spasticity; weakness)          Walk 10 feet activity   Assist  Walk 10 feet activity did not occur: Safety/medical concerns        Walk 50 feet activity   Assist Walk 50 feet with 2 turns activity did not occur: Safety/medical concerns         Walk 150 feet activity   Assist Walk 150 feet activity did not occur: Safety/medical concerns         Walk 10 feet on uneven surface  activity   Assist Walk 10 feet on uneven  surfaces activity did not occur: Safety/medical concerns         Wheelchair     Assist Is the patient using a wheelchair?: Yes Type of Wheelchair: Power Wheelchair activity did not occur: Safety/medical concerns (w/c malfunction)         Wheelchair 50 feet with 2 turns activity    Assist    Wheelchair 50 feet with 2 turns activity did not occur: Safety/medical concerns       Wheelchair 150 feet activity     Assist  Wheelchair 150 feet activity did not occur: Safety/medical concerns       Blood pressure 112/69, pulse 90, temperature 98.6 F (37 C), temperature source Oral, resp. rate 18, height 5\' 9"  (1.753 m), weight 89.9 kg, SpO2 100%.  Medical Problem List and Plan: 1. Functional deficits secondary to Cervical myelopathy s/p ACDF and PCDF C6-T1 1/17 by Dr. Jake Samples             -patient may shower, please cover incision             -ELOS/Goals: 2/6, Sup to min A               -Continue CIR therapies including PT, OT   - changed to 15/7  Still a difficult balance between spasticity and tolerance of meds  2.  Antithrombotics: -DVT/anticoagulation:  Pharmaceutical: Lovenox             -antiplatelet therapy: N/A 3. Pain Management:  Tylenol 650 mg qid. Continue MS contin  15mg  w/supper and oxycodone 5mg  prn  1/23- pain mainly from spasms, but also now has new neck pain from posterior fusion- con't regimen  1/26- main spasms from spasticity- neck pain minor per pt 4. Mood/Behavior/Sleep: LCSW to follow for evaluation and support.              --was on Seroquel for sleep- restart              -Restart cymbalta at 30mg  daily             -antipsychotic agents: Seroquel for confusion- on 25-50 mg at bedtime-seems to be sleeping fairly well with this     5. Neuropsych/cognition: This patient is intermittently  capable of making decisions on his own behalf. 6. Skin/Wound Care: Routine pressure relief measures.              --neck incision cdi, seroma decreasing.   7. Fluids/Electrolytes/Nutrition: Monitor I/O. Check CMET in am             -Continue vitamin C, prosource 8. Spasticity: Continue  Valium 5 mg qid, baclofen 20 mg qid, Zanaflex 4 mg every 6 hours prn.    1/23- spasms still a major issue- will monitor for other options. Baclofen has been decreased from prior dose of 30 mg q6 hours- will see if need to increase again. 1/24- d/w pt/fiance- will increase Baclofen to 30 mg q6 hours. 1/25- will add dantrolene back tomorrow-if tolerates and spasticity improves, then maybe can reduce Valium.   1/27 spasticity on going -see ID discussion below - k Dantrolene 25 mg at bedtime and titrate up q5 days per Dr. Elbert Ewings -  Baclofen 10 mg QID prn has been added for severe spasms not controlled with other regimen 1/29 increased dantrium to 25mg  bid. Titrate as tolerated 1/30 same plan, rx uti, no PRAFO d/t prior skin breakdown 9. Neurogenic bladder: Cath every 4 hours to keep bladder volumes < 350 cc. Continue flomax 0.4mg              --1/23-1/25con't cathing, no matter cath volumes 5x/day 10. Neurogenic bowel: Was not on bowel program. Last BM documented on 01/18 --Will change senna to am, continue miralax in am.  --Schedule mini enema at nights.  -Sorbitol scheduled tomorrow    1/23- hasn't had good BM's on acute, so will do sorbitol today.   1/24- had good BM last night with bowel program  1/25- didn't have any results last night- will give sorbitol to get results tonight and increase Senna to 3 tabs daily- already on Miralax daily. 1/26- NO BM with bowel program again last night- will give Sorbitol 60cc and do mini enema tonight if no BM today. Constipation is likely why spasticity is worse.    1/27 had large bm yesterday morning  1/30 last bm this morning, incontinent, no results with PM bowel program   -receiving meds in am. Will give dose of sorbitol tonight to see if we can get him back on a pm schedule 11. Fever:  -now afebrile   1/30 Cx + for 60k  Klebsiella- R to zosyn however, WBC's down to 6k -continue maxipime today--  change to bactrim Friday -monitor clinically         LOS: 8 days A FACE TO FACE EVALUATION WAS PERFORMED  Ranelle Oyster 07/25/2023, 9:32 AM

## 2023-07-25 NOTE — Progress Notes (Signed)
Physical Therapy Session Note  Patient Details  Name: Leon Rodriguez MRN: [id number] Date of Birth: 1965-12-07  Today's Date: 07/25/2023 PT Individual Time: 1136-1205 PT Individual Time Calculation (min): 29 min   Short Term Goals: Week 1:  PT Short Term Goal 1 (Week 1): Pt will require max A with rolling in bilateral directions PT Short Term Goal 2 (Week 1): Pt will direct caregiver with dependent transfer set-up PT Short Term Goal 3 (Week 1): Pt will require min A with power wheelchair mobility 50 ft  Skilled Therapeutic Interventions/Progress Updates:    Pt received in PWC, agreeable to therapy. No complaint of pain. Session focused on using standing frame for tone management and to improve neuromuscular control of BLE. Pt tolerated standing x 3 bouts of 2-3 minutes including weight shifting and quad sets. Utilized distraction in standing for pain management to allow longer stand times. Pt assisted back to bed after session with min a x 2 slideboard transfer, with cues to direct transfer. Pt was left with all needs in reach and alarm active.   Therapy Documentation Precautions:  Precautions Precautions: Fall, Cervical Precaution Comments: severe muscle spasms, hard collar for comfort Required Braces or Orthoses: Cervical Brace Cervical Brace: Hard collar Restrictions Weight Bearing Restrictions Per Provider Order: No Other Position/Activity Restrictions: per [name] PA, Pt can wear cervical brace PRN as on 1/20 General:      Therapy/Group: Individual Therapy  Juluis Rainier 07/25/2023, 5:36 PM

## 2023-07-25 NOTE — Plan of Care (Signed)
  Problem: RH Pre-functional/Other (Specify) Goal: RH LTG OT (Specify) 1 Description: RH LTG OT (Specify) 1 Flowsheets (Taken 07/25/2023 1519) LTG: Other OT (Specify) 1: Pt will direct caregiver on toileting steps at bed level with supervision   Problem: RH Tub/Shower Transfers Goal: LTG Patient will perform tub/shower transfers w/assist (OT) Description: LTG: Patient will perform tub/shower transfers with assist, with/without cues using equipment (OT) Outcome: Not Applicable Flowsheets (Taken 07/25/2023 1516) LTG: Pt will perform tub/shower stall transfers with assistance level of: (d/c goal) -- Note: D/c goal    Problem: RH Toilet Transfers Goal: LTG Patient will perform toilet transfers w/assist (OT) Description: LTG: Patient will perform toilet transfers with assist, with/without cues using equipment (OT) Flowsheets (Taken 07/25/2023 1516) LTG: Pt will perform toilet transfers with assistance level of: (d/c goal JLS) -- Note: d/c goal JLS    Problem: RH Toileting Goal: LTG Patient will perform toileting task (3/3 steps) with assistance level (OT) Description: LTG: Patient will perform toileting task (3/3 steps) with assistance level (OT)  Outcome: Not Applicable Flowsheets (Taken 07/25/2023 1516) LTG: Pt will perform toileting task (3/3 steps) with assistance level: (d/c goal JLS) -- Note: d/c goal JLS    Problem: RH Dressing Goal: LTG Patient will perform lower body dressing w/assist (OT) Description: LTG: Patient will perform lower body dressing with assist, with/without cues in positioning using equipment (OT) Outcome: Not Applicable Flowsheets (Taken 07/25/2023 1516) LTG: Pt will perform lower body dressing with assistance level of: (d/c goal JLS) -- Note: d/c goal JLS    Problem: RH Bathing Goal: LTG Patient will bathe all body parts with assist levels (OT) Description: LTG: Patient will bathe all body parts with assist levels (OT) Flowsheets (Taken 07/25/2023 1516) LTG:  Pt will perform bathing with assistance level/cueing: (downgraded goal JLS) Moderate Assistance - Patient 50 - 74% Note: Downgraded JLS

## 2023-07-26 MED ORDER — SULFAMETHOXAZOLE-TRIMETHOPRIM 800-160 MG PO TABS
1.0000 | ORAL_TABLET | Freq: Two times a day (BID) | ORAL | Status: DC
  Administered 2023-07-27 – 2023-08-01 (×11): 1 via ORAL
  Filled 2023-07-26 (×11): qty 1

## 2023-07-26 NOTE — Progress Notes (Signed)
Physical Therapy Weekly Progress Note  Patient Details  Name: Leon Rodriguez MRN: [id number] Date of Birth: Jan 18, 1966  Beginning of progress report period: July 18, 2023 End of progress report period: July 26, 2023  Patient has met 2 of 3 short term goals.  Pt is progressing towards goal of directing dependent transfer. Pt has mostly been performing a skilled transfer with transfer board, but is able to self-direct parts of that transfer and is improving in overall self direction of care. Pt is progressing well with bed mobility and rolling, now only requiring minA for LE management with use of bed rails for UE. Pt is now SBA for power w/c mobility and has been navigating environmental obstacles well and demonstrating appropriate use of power w/c functions and safety precautions.   Patient continues to demonstrate the following deficits muscle weakness and muscle joint tightness and abnormal tone and therefore will continue to benefit from skilled PT intervention to increase functional independence with mobility.  Patient progressing toward long term goals..  Plan of care revisions: Bed mobility LTG upgraded d/t progress.  PT Short Term Goals Week 1:  PT Short Term Goal 1 (Week 1): Pt will require max A with rolling in bilateral directions PT Short Term Goal 1 - Progress (Week 1): Met (using bed rails) PT Short Term Goal 2 (Week 1): Pt will direct caregiver with dependent transfer set-up PT Short Term Goal 2 - Progress (Week 1): Progressing toward goal PT Short Term Goal 3 (Week 1): Pt will require min A with power wheelchair mobility 50 ft PT Short Term Goal 3 - Progress (Week 1): Met Week 2:  PT Short Term Goal 1 (Week 2): STG=LTG d/t ELOS  Skilled Therapeutic Interventions/Progress Updates:  Ambulation/gait training;Community reintegration;DME/adaptive equipment instruction;Neuromuscular re-education;Stair training;Psychosocial support;UE/LE Strength taining/ROM;Wheelchair  propulsion/positioning;Balance/vestibular training;Discharge planning;Functional electrical stimulation;Pain management;Skin care/wound management;Therapeutic Activities;UE/LE Coordination activities;Cognitive remediation/compensation;Disease management/prevention;Splinting/orthotics;Functional mobility training;Patient/family education;Therapeutic Exercise;Visual/perceptual remediation/compensation   Therapy Documentation Precautions:  Precautions Precautions: Fall, Cervical Precaution Comments: severe muscle spasms, hard collar for comfort Required Braces or Orthoses: Cervical Brace Cervical Brace: Hard collar Restrictions Weight Bearing Restrictions Per Provider Order: No Other Position/Activity Restrictions: per [name] PA, Pt can wear cervical brace PRN as on 1/20 General:   Therapy/Group: Individual Therapy  Collins Scotland 07/26/2023, 4:20 PM

## 2023-07-26 NOTE — Progress Notes (Signed)
   Providing Compassionate, Quality Care - Together  NEUROSURGERY PROGRESS NOTE   S: No issues overnight. Progressing well, stood sustained yesterday  O: EXAM:  BP 121/80 (BP Location: Right Arm)   Pulse 92   Temp 98.7 F (37.1 C)   Resp 18   Ht 5\' 9"  (1.753 m)   Wt 89.9 kg   SpO2 100%   BMI 29.25 kg/m   Awake, alert, oriented x3 PERRL Speech fluent, appropriate  CNs grossly intact  5/5 BUE BLE 2-3/5 Wound healing well posteriorly with improvement in seroma, no erythema, no drainage  ASSESSMENT:  59 y.o. male with   CSM, s/p front/back decompression/fusion  PLAN: - improving and able to stand -continue therapies -plan dc home feb 6    Thank you for allowing me to participate in this patient's care.  Please do not hesitate to call with questions or concerns.   Monia Pouch, DO Neurosurgeon [facility] [phone]

## 2023-07-26 NOTE — Progress Notes (Signed)
Pt prefers to do Bowel Program at 2100 due to having his wife assistance. Changed time in [facility].

## 2023-07-26 NOTE — Progress Notes (Signed)
PROGRESS NOTE   Subjective/Complaints:  Pt with reasonable sleep. Was given extra seroquel last night. A little slower to wake up this morning for me. Spasms a little better with therapy yesterday. Was able to utilize them to an extent to help stand.   ROS: Patient denies fever, rash, sore throat, blurred vision, dizziness, nausea, vomiting, diarrhea, cough, shortness of breath or chest pain,   headache, or mood change.   Objective:   No results found.   Recent Labs    07/24/23 0528 07/25/23 0555  WBC 7.8 5.9  HGB 9.8* 10.4*  HCT 28.2* 30.3*  PLT 422* 437*    Recent Labs    07/24/23 0528 07/25/23 0555  NA 136 138  K 3.8 3.7  CL 101 103  CO2 24 29  GLUCOSE 103* 100*  BUN 8 7  CREATININE 0.99 0.68  CALCIUM 9.2 9.6     Intake/Output Summary (Last 24 hours) at 07/26/2023 1123 Last data filed at 07/26/2023 0915 Gross per 24 hour  Intake 982 ml  Output 3551 ml  Net -2569 ml     Pressure Injury 07/17/23 Heel Left Stage 2 -  Partial thickness loss of dermis presenting as a shallow open injury with a red, pink wound bed without slough. blister that has ruptured with drainage (Active)  07/17/23 (as per nurse Toni Amend) 1700  Location: Heel  Location Orientation: Left  Staging: Stage 2 -  Partial thickness loss of dermis presenting as a shallow open injury with a red, pink wound bed without slough.  Wound Description (Comments): blister that has ruptured with drainage  Present on Admission: Yes     Physical Exam: Vital Signs Blood pressure 121/80, pulse 92, temperature 98.7 F (37.1 C), resp. rate 18, height 5\' 9"  (1.753 m), weight 89.9 kg, SpO2 100%.       Constitutional: No distress. A little sluggish. Vital signs reviewed. HEENT: NCAT, EOMI, oral membranes moist Neck: supple Cardiovascular: RRR without murmur. No JVD    Respiratory/Chest: CTA Bilaterally without wheezes or rales. Normal effort     GI/Abdomen: BS +, non-tender, non-distended Ext: no clubbing, cyanosis, or edema Psych: pleasant and cooperative  Skin: swelling around surgical site. Left heel wound pink with central opening, healing Neurological: more alert after being slow initially.  Mentation appropriate. Speech clear.  MOTOR: RUE: 5/5 Deltoid, 5/5 Biceps, 5/5 Triceps,5/5 Grip LUE: 5/5 Deltoid, 5/5 Biceps, 5/5 Triceps, 5/5 Grip RLE: HF 2+/5, KE 3/5, ADF 4/5, APF 4/5 LLE: HF 2/5, KE 2/5, ADF 2/5, APF 2/5- remains hard to test d/t tone   Severe hypertonia b/l LE left greater than right, 3-4/4--triggered by simple movement of foot or knee.    SENSORY: Altered sensation below T4 level --unchanged    Assessment/Plan: 1. Functional deficits which require 3+ hours per day of interdisciplinary therapy in a comprehensive inpatient rehab setting. Physiatrist is providing close team supervision and 24 hour management of active medical problems listed below. Physiatrist and rehab team continue to assess barriers to discharge/monitor patient progress toward functional and medical goals  Care Tool:  Bathing    Body parts bathed by patient: Right arm, Left arm, Chest, Abdomen, Front perineal area, Face  Body parts bathed by helper: Buttocks, Right upper leg, Right lower leg, Left lower leg, Left upper leg     Bathing assist Assist Level: Maximal Assistance - Patient 24 - 49%     Upper Body Dressing/Undressing Upper body dressing   What is the patient wearing?: Pull over shirt    Upper body assist Assist Level: Moderate Assistance - Patient 50 - 74%    Lower Body Dressing/Undressing Lower body dressing      What is the patient wearing?: Pants, Incontinence brief     Lower body assist Assist for lower body dressing: Dependent - Patient 0%     Toileting Toileting    Toileting assist Assist for toileting: 2 Helpers     Transfers Chair/bed transfer  Transfers assist  Chair/bed transfer activity did  not occur: Safety/medical concerns  Chair/bed transfer assist level: 2 Helpers     Locomotion Ambulation   Ambulation assist   Ambulation activity did not occur: Safety/medical concerns (spasticity; weakness)          Walk 10 feet activity   Assist  Walk 10 feet activity did not occur: Safety/medical concerns        Walk 50 feet activity   Assist Walk 50 feet with 2 turns activity did not occur: Safety/medical concerns         Walk 150 feet activity   Assist Walk 150 feet activity did not occur: Safety/medical concerns         Walk 10 feet on uneven surface  activity   Assist Walk 10 feet on uneven surfaces activity did not occur: Safety/medical concerns         Wheelchair     Assist Is the patient using a wheelchair?: Yes Type of Wheelchair: Power Wheelchair activity did not occur: Safety/medical concerns (w/c malfunction)         Wheelchair 50 feet with 2 turns activity    Assist    Wheelchair 50 feet with 2 turns activity did not occur: Safety/medical concerns       Wheelchair 150 feet activity     Assist  Wheelchair 150 feet activity did not occur: Safety/medical concerns       Blood pressure 121/80, pulse 92, temperature 98.7 F (37.1 C), resp. rate 18, height 5\' 9"  (1.753 m), weight 89.9 kg, SpO2 100%.  Medical Problem List and Plan: 1. Functional deficits secondary to Cervical myelopathy s/p ACDF and PCDF C6-T1 1/17 by Dr. Jake Samples             -patient may shower, please cover incision             -ELOS/Goals: 2/6, Sup to min A             -Continue CIR therapies including PT, OT   - changed to 15/7  Still a difficult balance between spasticity and tolerance of meds  2.  Antithrombotics: -DVT/anticoagulation:  Pharmaceutical: Lovenox             -antiplatelet therapy: N/A 3. Pain Management:  Tylenol 650 mg qid. Continue MS contin  15mg  w/supper and oxycodone 5mg  prn  1/23- pain mainly from spasms, but also  now has new neck pain from posterior fusion- con't regimen  1/26- main spasms from spasticity- neck pain minor per pt 4. Mood/Behavior/Sleep: LCSW to follow for evaluation and support.              --was on Seroquel for sleep- restart              -  Restart cymbalta at 30mg  daily             -antipsychotic agents: Seroquel for confusion- now increased to 50mg . Looked sluggish this morning. Observe on higher dose. He told me he had been sleeping ok on the 25mg  previously.     5. Neuropsych/cognition: This patient is intermittently  capable of making decisions on his own behalf. 6. Skin/Wound Care: Routine pressure relief measures.              --neck incision cdi, seroma decreasing in size  7. Fluids/Electrolytes/Nutrition: Monitor I/O. Check CMET in am             -Continue vitamin C, prosource 8. Spasticity: Continue Valium 5 mg qid, baclofen 20 mg qid, Zanaflex 4 mg every 6 hours prn.    1/23- spasms still a major issue- will monitor for other options. Baclofen has been decreased from prior dose of 30 mg q6 hours- will see if need to increase again. 1/24- d/w pt/fiance- will increase Baclofen to 30 mg q6 hours. 1/25- will add dantrolene back tomorrow-if tolerates and spasticity improves, then maybe can reduce Valium.   1/27 spasticity on going -see ID discussion below - k Dantrolene 25 mg at bedtime and titrate up q5 days per Dr. Elbert Ewings -  Baclofen 10 mg QID prn has been added for severe spasms not controlled with other regimen 1/29 increased dantrium to 25mg  bid. Titrate as tolerated 1/31 same plan, rx uti, no PRAFO d/t prior skin breakdown 9. Neurogenic bladder: Cath every 4 hours to keep bladder volumes < 350 cc. Continue flomax 0.4mg              --1/23-1/25con't cathing, no matter cath volumes 5x/day 10. Neurogenic bowel: Was not on bowel program. Last BM documented on 01/18 --Will change senna to am, continue miralax in am.  --Schedule mini enema at nights.  -Sorbitol scheduled tomorrow     1/23- hasn't had good BM's on acute, so will do sorbitol today.   1/24- had good BM last night with bowel program  1/25- didn't have any results last night- will give sorbitol to get results tonight and increase Senna to 3 tabs daily- already on Miralax daily. 1/26- NO BM with bowel program again last night- will give Sorbitol 60cc and do mini enema tonight if no BM today. Constipation is likely why spasticity is worse.    1/27 had large bm yesterday morning  1/31 had large bm yesterday at 15:45 but before admin of sorbitol/bowel program   -receiving oral meds in am and enemeez after supper without consistent results. Observe today with current bowel program 11. Fever:  -now afebrile   1/30 Cx + for 60k Klebsiella-  -last dose of cefepime today -begin bactrim tomorrow for 7 days -monitor clinically         LOS: 9 days A FACE TO FACE EVALUATION WAS PERFORMED  Ranelle Oyster 07/26/2023, 11:23 AM

## 2023-07-26 NOTE — Progress Notes (Signed)
Physical Therapy Session Note  Patient Details  Name: Leon Rodriguez MRN: [id number] Date of Birth: Feb 28, 1966  Today's Date: 07/26/2023 PT Individual Time: 1005-1100 PT Individual Time Calculation (min): 55 min   Short Term Goals: Week 1:  PT Short Term Goal 1 (Week 1): Pt will require max A with rolling in bilateral directions PT Short Term Goal 2 (Week 1): Pt will direct caregiver with dependent transfer set-up PT Short Term Goal 3 (Week 1): Pt will require min A with power wheelchair mobility 50 ft  Skilled Therapeutic Interventions/Progress Updates: Pt presented in bed with SO and rehab tech present completing peri-care. Pt agreeable to therapy. PTA assisting with donning socks and shoes and pt was able to pull self up to long sit and SO provided shirt with set up. Pt then pull self back up into long sit and pt completed supine to sit with modA for BLE management only. PTA set up Slide board total A and with bed slightly elevated pt completed Slide board transfer with light minA with PTA positioning pt via Bobath technique. Discussed with pt and SO family ed for Monday as well as anticipated set up upon d/c. Discussed that for safety best to have 2  people present for transfers as spasms can make transfers unpredictable at times. Pt states anticipate getting chair lift for home and may keep PWC upstairs initially. Explained that if pt plans to go out in community may want to keep PWC downstairs as provides more accessibility. Pt navigated to main gym with supervision and participated in BUE strengthening with emphasis on peri-scapular ms. Pt performed the following Backwards shoulder rolls 2 x 10 Chin tucks (small range more for neck flexors activation) x 10 Y's with 4lb barbell 2 x 10 T's with 2lb barbell 2 x 10 ER with red theraband and towel squeeze for improved positioning 2 x 10 Scapular squeezes 2 x 10  Pt then navigated back to room in same manner as prior and remained in PWC at  end of session with SO present and needs met.      Therapy Documentation Precautions:  Precautions Precautions: Fall, Cervical Precaution Comments: severe muscle spasms, hard collar for comfort Required Braces or Orthoses: Cervical Brace Cervical Brace: Hard collar Restrictions Weight Bearing Restrictions Per Provider Order: No Other Position/Activity Restrictions: per [name] PA, Pt can wear cervical brace PRN as on 1/20    Therapy/Group: Individual Therapy  Tarrah Furuta 07/26/2023, 7:55 AM

## 2023-07-27 LAB — CBC WITH DIFFERENTIAL/PLATELET
Abs Immature Granulocytes: 0.04 10*3/uL (ref 0.00–0.07)
Basophils Absolute: 0 10*3/uL (ref 0.0–0.1)
Basophils Relative: 1 %
Eosinophils Absolute: 0.1 10*3/uL (ref 0.0–0.5)
Eosinophils Relative: 2 %
HCT: 32.6 % — ABNORMAL LOW (ref 39.0–52.0)
Hemoglobin: 11 g/dL — ABNORMAL LOW (ref 13.0–17.0)
Immature Granulocytes: 1 %
Lymphocytes Relative: 32 %
Lymphs Abs: 2 10*3/uL (ref 0.7–4.0)
MCH: 32.7 pg (ref 26.0–34.0)
MCHC: 33.7 g/dL (ref 30.0–36.0)
MCV: 97 fL (ref 80.0–100.0)
Monocytes Absolute: 0.6 10*3/uL (ref 0.1–1.0)
Monocytes Relative: 10 %
Neutro Abs: 3.4 10*3/uL (ref 1.7–7.7)
Neutrophils Relative %: 54 %
Platelets: 450 10*3/uL — ABNORMAL HIGH (ref 150–400)
RBC: 3.36 MIL/uL — ABNORMAL LOW (ref 4.22–5.81)
RDW: 12.7 % (ref 11.5–15.5)
WBC: 6.3 10*3/uL (ref 4.0–10.5)
nRBC: 0 % (ref 0.0–0.2)

## 2023-07-27 LAB — CULTURE, BLOOD (ROUTINE X 2)
Culture: NO GROWTH
Culture: NO GROWTH

## 2023-07-27 MED ORDER — TIZANIDINE HCL 4 MG PO TABS
2.0000 mg | ORAL_TABLET | Freq: Three times a day (TID) | ORAL | Status: DC
  Administered 2023-07-27: 2 mg via ORAL
  Filled 2023-07-27: qty 1

## 2023-07-27 MED ORDER — TIZANIDINE HCL 4 MG PO TABS
4.0000 mg | ORAL_TABLET | Freq: Three times a day (TID) | ORAL | Status: DC
Start: 2023-07-27 — End: 2023-07-31
  Administered 2023-07-27 – 2023-07-30 (×10): 4 mg via ORAL
  Filled 2023-07-27 (×10): qty 1

## 2023-07-27 NOTE — Progress Notes (Signed)
Physical Therapy Session Note  Patient Details  Name: Leon Rodriguez MRN: [id number] Date of Birth: 1966-02-26  Today's Date: 07/27/2023 PT Individual Time: 1115-1200 PT Individual Time Calculation (min): 45 min   Short Term Goals: Week 2:  PT Short Term Goal 1 (Week 2): STG=LTG d/t ELOS  Skilled Therapeutic Interventions/Progress Updates:    Pt received in PWC, agreeable to therapy. No complaint of pain at rest, unrated pain with spasms, premedicated. Rest and positioning provided as needed.  Pt navigated PWC throughout session with assist only in/out of // bars.  Pt set up in // bars and performed x 3 pulls toward standing with max a of 2 fading to max of 1. Pt able to achieve squat position with assist and knee block to encourage BLE neuromuscular return and manage tone. Pt then reports BM, so returned to room.   Pt and SO performed slideboard transfer with therapist standing by for safety and providing education on safety and body mechanics for caregiver safety. Pt assisted with dependent brief change and remained in bed with SO present.   Therapy Documentation Precautions:  Precautions Precautions: Fall, Cervical Precaution Comments: severe muscle spasms, hard collar for comfort Required Braces or Orthoses: Cervical Brace Cervical Brace: Hard collar Restrictions Weight Bearing Restrictions Per Provider Order: No Other Position/Activity Restrictions: per [name] PA, Pt can wear cervical brace PRN as on 1/20 General:      Therapy/Group: Individual Therapy  Juluis Rainier 07/27/2023, 12:57 PM

## 2023-07-27 NOTE — Progress Notes (Signed)
Occupational Therapy Session Note  Patient Details  Name: Leon Rodriguez MRN: [id number] Date of Birth: 08-17-1965  Today's Date: 07/27/2023 OT Individual Time: 4098-1191 OT Individual Time Calculation (min): 40 min    Short Term Goals: Week 2:  OT Short Term Goal 1 (Week 2): STG=LTG (downgraded) 2/2 ELOS  Skilled Therapeutic Interventions/Progress Updates:    Pt resting in PWC upon arrival. OT intervention with focus on TB transfers, PWC mobility, and unsupported sitting balance to increase pt's ability to assist with transfers and ADLs. All PWC mobility with supervision. TB transfer PWC>EOM with min A. Unsupported sitting balance with min A to toss ball to rehab tech 4x5. TB transfer back to [facility] with mod A. Pt returned to room and remained in PWC with SO present.   Therapy Documentation Precautions:  Precautions Precautions: Fall, Cervical Precaution Comments: severe muscle spasms, hard collar for comfort Required Braces or Orthoses: Cervical Brace Cervical Brace: Hard collar Restrictions Weight Bearing Restrictions Per Provider Order: No Other Position/Activity Restrictions: per [name] PA, Pt can wear cervical brace PRN as on 1/20 Pain: Ongoing BLE spasms with accompanying discomfort/pain; repositioning and rest as appropriate   Therapy/Group: Individual Therapy  Rich Brave 07/27/2023, 10:29 AM

## 2023-07-27 NOTE — Progress Notes (Signed)
Physical Therapy Session Note  Patient Details  Name: Leon Rodriguez MRN: [id number] Date of Birth: [birth year]  Today's Date: 07/27/2023 PT Individual Time: 1445-1515 PT Individual Time Calculation (min): 30 min   Short Term Goals: Week 1:  PT Short Term Goal 1 (Week 1): Pt will require max A with rolling in bilateral directions PT Short Term Goal 1 - Progress (Week 1): Met (using bed rails) PT Short Term Goal 2 (Week 1): Pt will direct caregiver with dependent transfer set-up PT Short Term Goal 2 - Progress (Week 1): Progressing toward goal PT Short Term Goal 3 (Week 1): Pt will require min A with power wheelchair mobility 50 ft PT Short Term Goal 3 - Progress (Week 1): Met  Skilled Therapeutic Interventions/Progress Updates:    Pt seen in bed with nsg and his SO present. Note significantly worse spasms vs am session. Therapist and tech provided passive ROM/ stretching with some improvement with rhythmic rotation, but pt continues to report extreme pain from spasms. Pt unsafe to mobilize with this level of spasms, so pt missed x 15 min od scheduled session, will make up as able.   Therapy Documentation Precautions:  Precautions Precautions: Fall, Cervical Precaution Comments: severe muscle spasms, hard collar for comfort Required Braces or Orthoses: Cervical Brace Cervical Brace: Hard collar Restrictions Weight Bearing Restrictions Per Provider Order: No Other Position/Activity Restrictions: per [name] PA, Pt can wear cervical brace PRN as on 1/20 General: PT Amount of Missed Time (min): 15 Minutes PT Missed Treatment Reason: Other (Comment)     Therapy/Group: Individual Therapy  Juluis Rainier 07/27/2023, 3:43 PM

## 2023-07-27 NOTE — Progress Notes (Signed)
PROGRESS NOTE   Subjective/Complaints:  Still having spasms  Patient denies chest feels restricted but does well on incentive spiro  ROS: no abd pain , feels like he needs BM     Objective:   No results found.   Recent Labs    07/25/23 0555  WBC 5.9  HGB 10.4*  HCT 30.3*  PLT 437*    Recent Labs    07/25/23 0555  NA 138  K 3.7  CL 103  CO2 29  GLUCOSE 100*  BUN 7  CREATININE 0.68  CALCIUM 9.6     Intake/Output Summary (Last 24 hours) at 07/27/2023 1140 Last data filed at 07/27/2023 1610 Gross per 24 hour  Intake 418 ml  Output 3880 ml  Net -3462 ml     Pressure Injury 07/17/23 Heel Left Stage 2 -  Partial thickness loss of dermis presenting as a shallow open injury with a red, pink wound bed without slough. blister that has ruptured with drainage (Active)  07/17/23 (as per nurse Toni Amend) 1700  Location: Heel  Location Orientation: Left  Staging: Stage 2 -  Partial thickness loss of dermis presenting as a shallow open injury with a red, pink wound bed without slough.  Wound Description (Comments): blister that has ruptured with drainage  Present on Admission: Yes     Physical Exam: Vital Signs Blood pressure 111/68, pulse 91, temperature 98.5 F (36.9 C), resp. rate 18, height 5\' 9"  (1.753 m), weight 89.9 kg, SpO2 100%.  General: No acute distress Mood and affect are appropriate Heart: Regular rate and rhythm no rubs murmurs or extra sounds Lungs: Clear to auscultation, breathing unlabored, no rales or wheezes Abdomen: Positive bowel sounds, soft nontender to palpation, nondistended Extremities: No clubbing, cyanosis, or edema  Skin: swelling around surgical site. Left heel wound pink with central opening, healing Neurological: more alert after being slow initially.  Mentation appropriate. Speech clear.  MOTOR: RUE: 5/5 Deltoid, 5/5 Biceps, 5/5 Triceps,5/5 Grip LUE: 5/5 Deltoid, 5/5 Biceps,  5/5 Triceps, 5/5 Grip RLE: HF 2+/5, KE 3/5, ADF 4/5, APF 4/5 LLE: HF 2/5, KE 2/5, ADF 2/5, APF 2/5- remains hard to test d/t tone   Severe hypertonia b/l LE left greater than right, 3-4/4--triggered by simple movement of foot or knee.   Increased left hip adductor tone   Prior exam  SENSORY: Altered sensation below T4 level -    Assessment/Plan: 1. Functional deficits which require 3+ hours per day of interdisciplinary therapy in a comprehensive inpatient rehab setting. Physiatrist is providing close team supervision and 24 hour management of active medical problems listed below. Physiatrist and rehab team continue to assess barriers to discharge/monitor patient progress toward functional and medical goals  Care Tool:  Bathing    Body parts bathed by patient: Right arm, Left arm, Chest, Abdomen, Front perineal area, Face   Body parts bathed by helper: Buttocks, Right upper leg, Right lower leg, Left lower leg, Left upper leg     Bathing assist Assist Level: Maximal Assistance - Patient 24 - 49%     Upper Body Dressing/Undressing Upper body dressing   What is the patient wearing?: Pull over shirt    Upper  body assist Assist Level: Moderate Assistance - Patient 50 - 74%    Lower Body Dressing/Undressing Lower body dressing      What is the patient wearing?: Pants, Incontinence brief     Lower body assist Assist for lower body dressing: Dependent - Patient 0%     Toileting Toileting    Toileting assist Assist for toileting: 2 Helpers     Transfers Chair/bed transfer  Transfers assist  Chair/bed transfer activity did not occur: Safety/medical concerns  Chair/bed transfer assist level: 2 Helpers (with transfer board, min-modA x2)     Locomotion Ambulation   Ambulation assist   Ambulation activity did not occur: Safety/medical concerns (spasticity and weakness)          Walk 10 feet activity   Assist  Walk 10 feet activity did not occur:  Safety/medical concerns (spasticity and weakness)        Walk 50 feet activity   Assist Walk 50 feet with 2 turns activity did not occur: Safety/medical concerns (spasticity and weakness)         Walk 150 feet activity   Assist Walk 150 feet activity did not occur: Safety/medical concerns (spasticity and weakness)         Walk 10 feet on uneven surface  activity   Assist Walk 10 feet on uneven surfaces activity did not occur: Safety/medical concerns (spasticity and weakness)         Wheelchair     Assist Is the patient using a wheelchair?: Yes Type of Wheelchair: Power Wheelchair activity did not occur: Safety/medical concerns (w/c malfunction)  Wheelchair assist level: Supervision/Verbal cueing Max wheelchair distance: 150    Wheelchair 50 feet with 2 turns activity    Assist    Wheelchair 50 feet with 2 turns activity did not occur: Safety/medical concerns   Assist Level: Supervision/Verbal cueing   Wheelchair 150 feet activity     Assist  Wheelchair 150 feet activity did not occur: Safety/medical concerns   Assist Level: Supervision/Verbal cueing   Blood pressure 111/68, pulse 91, temperature 98.5 F (36.9 C), resp. rate 18, height 5\' 9"  (1.753 m), weight 89.9 kg, SpO2 100%.  Medical Problem List and Plan: 1. Functional deficits secondary to Cervical myelopathy s/p ACDF and PCDF C6-T1 1/17 by Dr. Jake Samples             -patient may shower, please cover incision             -ELOS/Goals: 2/6, Sup to min A             -Continue CIR therapies including PT, OT   - changed to 15/7  Still a difficult balance between spasticity and tolerance of meds  2.  Antithrombotics: -DVT/anticoagulation:  Pharmaceutical: Lovenox             -antiplatelet therapy: N/A 3. Pain Management:  Tylenol 650 mg qid. Continue MS contin  15mg  w/supper and oxycodone 5mg  prn  1/23- pain mainly from spasms, but also now has new neck pain from posterior fusion- con't  regimen  1/26- main spasms from spasticity- neck pain minor per pt 4. Mood/Behavior/Sleep: LCSW to follow for evaluation and support.              --was on Seroquel for sleep- restart              -Restart cymbalta at 30mg  daily             -antipsychotic agents: Seroquel for confusion- now increased to 50mg .  Looked sluggish this morning. Observe on higher dose. He told me he had been sleeping ok on the 25mg  previously.     5. Neuropsych/cognition: This patient is intermittently  capable of making decisions on his own behalf. 6. Skin/Wound Care: Routine pressure relief measures.              --neck incision cdi, seroma decreasing in size  7. Fluids/Electrolytes/Nutrition: Monitor I/O. Check CMET in am             -Continue vitamin C, prosource 8. Spasticity: Continue Valium 5 mg qid, baclofen 20 mg qid, Zanaflex 4 mg every 6 hours prn.    1/23- spasms still a major issue- will monitor for other options. Baclofen has been decreased from prior dose of 30 mg q6 hours- will see if need to increase again. 1/24- d/w pt/fiance- will increase Baclofen to 30 mg q6 hours. 1/25- will add dantrolene back tomorrow-if tolerates and spasticity improves, then maybe can reduce Valium.   1/27 spasticity on going -see ID discussion below - k Dantrolene 25 mg at bedtime and titrate up q5 days per Dr. Elbert Ewings -  Baclofen 10 mg QID prn has been added for severe spasms not controlled with other regimen 1/29 increased dantrium to 25mg  bid. Titrate as tolerated 2/1 change tizanidine to 2mg  TID 9. Neurogenic bladder: Cath every 4 hours to keep bladder volumes < 350 cc. Continue flomax 0.4mg              --1/23-1/25con't cathing, no matter cath volumes 5x/day 10. Neurogenic bowel: Was not on bowel program. Last BM documented on 01/18 --Will change senna to am, continue miralax in am.  --Schedule mini enema at nights.  -Sorbitol scheduled tomorrow    1/23- hasn't had good BM's on acute, so will do sorbitol today.   1/24-  had good BM last night with bowel program  1/25- didn't have any results last night- will give sorbitol to get results tonight and increase Senna to 3 tabs daily- already on Miralax daily. 1/26- NO BM with bowel program again last night- will give Sorbitol 60cc and do mini enema tonight if no BM today. Constipation is likely why spasticity is worse.    1/27 had large bm yesterday morning  1/31 had large bm yesterday at 15:45 but before admin of sorbitol/bowel program   -receiving oral meds in am and enemeez after supper without consistent results. Observe today with current bowel program 11. Fever:  -now afebrile   1/30 Cx + for 60k Klebsiella-  Completed cefepime  -begin bactrim tomorrow for 7 days -monitor clinically         LOS: 10 days A FACE TO FACE EVALUATION WAS PERFORMED  Erick Colace 07/27/2023, 11:40 AM

## 2023-07-27 NOTE — Progress Notes (Signed)
Pt prefers bowel program later in evening with wife assistance, wife agrees.

## 2023-07-27 NOTE — Plan of Care (Signed)
  Problem: Consults Goal: RH SPINAL CORD INJURY PATIENT EDUCATION Description:  See Patient Education module for education specifics.  Outcome: Progressing   Problem: SCI BOWEL ELIMINATION Goal: RH STG MANAGE BOWEL WITH ASSISTANCE Description: STG Manage Bowel with medication with min Assistance. Outcome: Progressing Goal: RH STG SCI MANAGE BOWEL WITH MEDICATION WITH ASSISTANCE Description: STG SCI Manage bowel with medication with assistance. Outcome: Progressing   Problem: SCI BLADDER ELIMINATION Goal: RH STG MANAGE BLADDER WITH ASSISTANCE Description: STG Manage Bladder medications with minimal Assistance Outcome: Progressing Goal: RH STG MANAGE BLADDER WITH MEDICATION WITH ASSISTANCE Description: STG Manage Bladder With Medication With Assistance. Outcome: Progressing   Problem: RH SAFETY Goal: RH STG ADHERE TO SAFETY PRECAUTIONS W/ASSISTANCE/DEVICE Description: STG Adhere to Safety Precautions With min Assistance/Device. Outcome: Progressing   Problem: RH PAIN MANAGEMENT Goal: RH STG PAIN MANAGED AT OR BELOW PT'S PAIN GOAL Description: <4 with prns Outcome: Progressing   Problem: RH KNOWLEDGE DEFICIT SCI Goal: RH STG INCREASE KNOWLEDGE OF SELF CARE AFTER SCI Outcome: Progressing

## 2023-07-28 NOTE — Progress Notes (Signed)
PROGRESS NOTE   Subjective/Complaints:  Still having spasms  Tizanidine scheduled 4mg  tid  ROS: no abd pain , feels like he needs BM     Objective:   No results found.   Recent Labs    07/27/23 1634  WBC 6.3  HGB 11.0*  HCT 32.6*  PLT 450*    No results for input(s): "NA", "K", "CL", "CO2", "GLUCOSE", "BUN", "CREATININE", "CALCIUM" in the last 72 hours.    Intake/Output Summary (Last 24 hours) at 07/28/2023 1204 Last data filed at 07/28/2023 0753 Gross per 24 hour  Intake 118 ml  Output 1850 ml  Net -1732 ml     Pressure Injury 07/17/23 Heel Left Stage 2 -  Partial thickness loss of dermis presenting as a shallow open injury with a red, pink wound bed without slough. blister that has ruptured with drainage (Active)  07/17/23 (as per nurse Toni Amend) 1700  Location: Heel  Location Orientation: Left  Staging: Stage 2 -  Partial thickness loss of dermis presenting as a shallow open injury with a red, pink wound bed without slough.  Wound Description (Comments): blister that has ruptured with drainage  Present on Admission: Yes     Physical Exam: Vital Signs Blood pressure 118/79, pulse 91, temperature 98.7 F (37.1 C), resp. rate 16, height 5\' 9"  (1.753 m), weight 89.9 kg, SpO2 100%.  General: No acute distress Mood and affect are appropriate Heart: Regular rate and rhythm no rubs murmurs or extra sounds Lungs: Clear to auscultation, breathing unlabored, no rales or wheezes Abdomen: Positive bowel sounds, soft nontender to palpation, nondistended Extremities: No clubbing, cyanosis, or edema  Skin: swelling around surgical site. Left heel wound pink with central opening, healing Neurological: more alert after being slow initially.  Mentation appropriate. Speech clear.  MOTOR: RUE: 5/5 Deltoid, 5/5 Biceps, 5/5 Triceps,5/5 Grip LUE: 5/5 Deltoid, 5/5 Biceps, 5/5 Triceps, 5/5 Grip RLE: HF 2+/5, KE 3/5, ADF  4/5, APF 4/5 LLE: HF 2/5, KE 2/5, ADF 2/5, APF 2/5- remains hard to test d/t tone   Severe hypertonia b/l LE left greater than right, 3-4/4--triggered by simple movement of foot or knee.   Increased left hip adductor tone   Prior exam  SENSORY: Altered sensation below T4 level -    Assessment/Plan: 1. Functional deficits which require 3+ hours per day of interdisciplinary therapy in a comprehensive inpatient rehab setting. Physiatrist is providing close team supervision and 24 hour management of active medical problems listed below. Physiatrist and rehab team continue to assess barriers to discharge/monitor patient progress toward functional and medical goals  Care Tool:  Bathing    Body parts bathed by patient: Right arm, Left arm, Chest, Abdomen, Front perineal area, Face   Body parts bathed by helper: Buttocks, Right upper leg, Right lower leg, Left lower leg, Left upper leg     Bathing assist Assist Level: Maximal Assistance - Patient 24 - 49%     Upper Body Dressing/Undressing Upper body dressing   What is the patient wearing?: Pull over shirt    Upper body assist Assist Level: Moderate Assistance - Patient 50 - 74%    Lower Body Dressing/Undressing Lower body dressing  What is the patient wearing?: Pants, Incontinence brief     Lower body assist Assist for lower body dressing: Dependent - Patient 0%     Toileting Toileting    Toileting assist Assist for toileting: 2 Helpers     Transfers Chair/bed transfer  Transfers assist  Chair/bed transfer activity did not occur: Safety/medical concerns  Chair/bed transfer assist level: 2 Helpers (with transfer board, min-modA x2)     Locomotion Ambulation   Ambulation assist   Ambulation activity did not occur: Safety/medical concerns (spasticity and weakness)          Walk 10 feet activity   Assist  Walk 10 feet activity did not occur: Safety/medical concerns (spasticity and weakness)         Walk 50 feet activity   Assist Walk 50 feet with 2 turns activity did not occur: Safety/medical concerns (spasticity and weakness)         Walk 150 feet activity   Assist Walk 150 feet activity did not occur: Safety/medical concerns (spasticity and weakness)         Walk 10 feet on uneven surface  activity   Assist Walk 10 feet on uneven surfaces activity did not occur: Safety/medical concerns (spasticity and weakness)         Wheelchair     Assist Is the patient using a wheelchair?: Yes Type of Wheelchair: Power Wheelchair activity did not occur: Safety/medical concerns (w/c malfunction)  Wheelchair assist level: Supervision/Verbal cueing Max wheelchair distance: 150    Wheelchair 50 feet with 2 turns activity    Assist    Wheelchair 50 feet with 2 turns activity did not occur: Safety/medical concerns   Assist Level: Supervision/Verbal cueing   Wheelchair 150 feet activity     Assist  Wheelchair 150 feet activity did not occur: Safety/medical concerns   Assist Level: Supervision/Verbal cueing   Blood pressure 118/79, pulse 91, temperature 98.7 F (37.1 C), resp. rate 16, height 5\' 9"  (1.753 m), weight 89.9 kg, SpO2 100%.  Medical Problem List and Plan: 1. Functional deficits secondary to Cervical myelopathy s/p ACDF and PCDF C6-T1 1/17 by Dr. Jake Samples             -patient may shower, please cover incision             -ELOS/Goals: 2/6, Sup to min A             -Continue CIR therapies including PT, OT   - changed to 15/7  Still a difficult balance between spasticity and tolerance of meds  2.  Antithrombotics: -DVT/anticoagulation:  Pharmaceutical: Lovenox             -antiplatelet therapy: N/A 3. Pain Management:  Tylenol 650 mg qid. Continue MS contin  15mg  w/supper and oxycodone 5mg  prn  1/23- pain mainly from spasms, but also now has new neck pain from posterior fusion- con't regimen  1/26- main spasms from spasticity- neck pain minor  per pt 4. Mood/Behavior/Sleep: LCSW to follow for evaluation and support.              --was on Seroquel for sleep- restart              -Restart cymbalta at 30mg  daily             -antipsychotic agents: Seroquel for confusion- now increased to 50mg . Looked sluggish this morning. Observe on higher dose. He told me he had been sleeping ok on the 25mg  previously.     5.  Neuropsych/cognition: This patient is intermittently  capable of making decisions on his own behalf. 6. Skin/Wound Care: Routine pressure relief measures.              --neck incision cdi, seroma decreasing in size  7. Fluids/Electrolytes/Nutrition: Monitor I/O. Check CMET in am             -Continue vitamin C, prosource 8. Spasticity: Continue Valium 5 mg qid, baclofen 20 mg qid, Zanaflex 4 mg every 6 hours prn.    1/23- spasms still a major issue- will monitor for other options. Baclofen has been decreased from prior dose of 30 mg q6 hours- will see if need to increase again. 1/24- d/w pt/fiance- will increase Baclofen to 30 mg q6 hours. 1/25- will add dantrolene back tomorrow-if tolerates and spasticity improves, then maybe can reduce Valium.   1/27 spasticity on going -see ID discussion below - k Dantrolene 25 mg at bedtime and titrate up q5 days per Dr. Elbert Ewings -  Baclofen 10 mg QID prn has been added for severe spasms not controlled with other regimen 1/29 increased dantrium to 25mg  bid. Titrate as tolerated 2/2 change tizanidine to 4mg  TID 9. Neurogenic bladder: Cath every 4 hours to keep bladder volumes < 350 cc. Continue flomax 0.4mg              --1/23-1/25con't cathing, no matter cath volumes 5x/day 10. Neurogenic bowel: Was not on bowel program. Last BM documented on 01/18 --Will change senna to am, continue miralax in am.  --Schedule mini enema at nights.  -Sorbitol scheduled tomorrow    1/23- hasn't had good BM's on acute, so will do sorbitol today.   1/24- had good BM last night with bowel program  1/25- didn't have  any results last night- will give sorbitol to get results tonight and increase Senna to 3 tabs daily- already on Miralax daily. 1/26- NO BM with bowel program again last night- will give Sorbitol 60cc and do mini enema tonight if no BM today. Constipation is likely why spasticity is worse.    1/27 had large bm yesterday morning  1/31 had large bm yesterday at 15:45 but before admin of sorbitol/bowel program   -receiving oral meds in am and enemeez after supper without consistent results. Observe today with current bowel program 11. Fever:  -now afebrile   1/30 Cx + for 60k Klebsiella-  Completed cefepime  -begin bactrim tomorrow for 7 days -monitor clinically     12.  Hypotension- likely multifactorial , orthostasis, spinal cord involvement , tizanidine  Asymptomatic while in bed Vitals:   07/28/23 0102 07/28/23 0537  BP: 105/69 118/79  Pulse: 96 91  Resp: 18 16  Temp: 98.4 F (36.9 C) 98.7 F (37.1 C)  SpO2: 100% 100%       LOS: 11 days A FACE TO FACE EVALUATION WAS PERFORMED  Erick Colace 07/28/2023, 12:04 PM

## 2023-07-28 NOTE — Progress Notes (Signed)
Physical Therapy Session Note  Patient Details  Name: Leon Rodriguez MRN: [id number] Date of Birth: 1965/08/05  Today's Date: 07/28/2023 PT Individual Time: 0955-1038 PT Individual Time Calculation (min): 43 min   Short Term Goals: Week 2:  PT Short Term Goal 1 (Week 2): STG=LTG d/t ELOS  Skilled Therapeutic Interventions/Progress Updates:      Therapy Documentation Precautions:  Precautions Precautions: Fall, Cervical Precaution Comments: severe muscle spasms, hard collar for comfort Required Braces or Orthoses: Cervical Brace Cervical Brace: Hard collar Restrictions Weight Bearing Restrictions Per Provider Order: No Other Position/Activity Restrictions: per [name] PA, Pt can wear cervical brace PRN as on 1/20  Pt agreeable to PT session with emphasis on lateral transfers with slide board and sit<>stand transfers in parallel bars. Pt mod A slide board transfer to [facility] and supervision with w/c mobility to main gym. Donned 10 # ankle weights to each LE along with gentle oscillatory movement for spasticity management as pt received medications right before PT. PT utilized flat sheet to promote hip extension with sit<>stand transfers in parallel bars x 3 max A x 1. Pt with improved upright posture and largely limited 2/2 left knee flexor tone, PT tech provided manual facilitation to help oppose. Pt requested to return to bed for toileting, min A with down hill transfer to bed from [facility] and mod A with sit to lying. Pt left in care of S/O with all needs in reach.     Therapy/Group: Individual Therapy  Truitt Leep Truitt Leep PT, DPT  07/28/2023, 12:32 PM

## 2023-07-28 NOTE — Progress Notes (Signed)
Occupational Therapy Session Note  Patient Details  Name: Leon Rodriguez MRN: [id number] Date of Birth: 06-24-1966  Today's Date: 07/28/2023 OT Individual Time: 1120-1200 and 1401-1444 OT Individual Time Calculation (min): 40 min and 43 min   Short Term Goals: Week 1:  OT Short Term Goal 1 (Week 1): Pt will complete bed mobility Mod A in preparation for LB ADLs OT Short Term Goal 1 - Progress (Week 1): Met OT Short Term Goal 2 (Week 1): Pt will complete LB dressing Max A OT Short Term Goal 2 - Progress (Week 1): Progressing toward goal OT Short Term Goal 3 (Week 1): Pt will complete UB dressing SBA OT Short Term Goal 3 - Progress (Week 1): Met Week 2:  OT Short Term Goal 1 (Week 2): STG=LTG (downgraded) 2/2 ELOS   Skilled Therapeutic Interventions/Progress Updates:    Session 1: Pt bed level at time of session, c/o spasm pain which has been ongoing and pt still able to participate. SO present throughout. Pt initially requesting BLE ROM/stretches as preparatory activity, OT providing BLE PROM/AAROM for BLE at hip/knee/ankle prior to bed mob. Supine > sit from elevated HOB with MIN/MOD A for LLE and sitting EOB. Donned shoes dependent for additional fritction for sitting at EOB, performing various resisted push/pulls with towel and AROM for no UE support in sitting to improve core strength and sitting balance. Returned to supine MOD A, scoot up in bed and positioned with supports with pt directing his care. Alarm on call bell in reach SO present.   Session 2: Pt bed level at time of session with brother present - initially 10/10 pain and nursing present providing pain med at beginning of session and pt able to participate. Pt only wanting to do BLE stretches this date and not get OOB, but eventually agreeable to sit EOB. Using leg lifters/straps, performing bed mob with MOD A to sit EOB, guided through more PROM for BLEs to improve ability to sit EOB for ADL tasks. Note pt believed to have BM at  this time, returned to supine and dependent brief change with mostly clean brief and no BM. Positioned with devices, alarm on call bell in reach.   Therapy Documentation Precautions:  Precautions Precautions: Fall, Cervical Precaution Comments: severe muscle spasms, hard collar for comfort Required Braces or Orthoses: Cervical Brace Cervical Brace: Hard collar Restrictions Weight Bearing Restrictions Per Provider Order: No Other Position/Activity Restrictions: per [name] PA, Pt can wear cervical brace PRN as on 1/20    Therapy/Group: Individual Therapy  Erasmo Score 07/28/2023, 12:01 PM

## 2023-07-29 DIAGNOSIS — N390 Urinary tract infection, site not specified: Secondary | ICD-10-CM

## 2023-07-29 DIAGNOSIS — A499 Bacterial infection, unspecified: Secondary | ICD-10-CM

## 2023-07-29 LAB — CBC
HCT: 29.4 % — ABNORMAL LOW (ref 39.0–52.0)
Hemoglobin: 10.4 g/dL — ABNORMAL LOW (ref 13.0–17.0)
MCH: 34 pg (ref 26.0–34.0)
MCHC: 35.4 g/dL (ref 30.0–36.0)
MCV: 96.1 fL (ref 80.0–100.0)
Platelets: 431 10*3/uL — ABNORMAL HIGH (ref 150–400)
RBC: 3.06 MIL/uL — ABNORMAL LOW (ref 4.22–5.81)
RDW: 13.2 % (ref 11.5–15.5)
WBC: 6.7 10*3/uL (ref 4.0–10.5)
nRBC: 0 % (ref 0.0–0.2)

## 2023-07-29 LAB — BASIC METABOLIC PANEL
Anion gap: 7 (ref 5–15)
BUN: 15 mg/dL (ref 6–20)
CO2: 25 mmol/L (ref 22–32)
Calcium: 9.4 mg/dL (ref 8.9–10.3)
Chloride: 105 mmol/L (ref 98–111)
Creatinine, Ser: 0.91 mg/dL (ref 0.61–1.24)
GFR, Estimated: >60 mL/min (ref >60)
Glucose, Bld: 96 mg/dL (ref 70–99)
Potassium: 3.9 mmol/L (ref 3.5–5.1)
Sodium: 137 mmol/L (ref 135–145)

## 2023-07-29 MED ORDER — SORBITOL 70 % SOLN
30.0000 mL | Freq: Once | Status: AC
  Administered 2023-07-29: 30 mL via ORAL
  Filled 2023-07-29: qty 30

## 2023-07-29 MED ORDER — DOCUSATE SODIUM 283 MG RE ENEM
1.0000 | ENEMA | Freq: Every day | RECTAL | Status: DC | PRN
  Administered 2023-07-29 – 2023-07-30 (×2): 283 mg via RECTAL
  Filled 2023-07-29 (×3): qty 1

## 2023-07-29 MED ORDER — FLEET ENEMA RE ENEM
1.0000 | ENEMA | Freq: Every day | RECTAL | Status: DC
  Filled 2023-07-29: qty 1

## 2023-07-29 MED ORDER — DANTROLENE SODIUM 25 MG PO CAPS
25.0000 mg | ORAL_CAPSULE | Freq: Three times a day (TID) | ORAL | Status: DC
  Administered 2023-07-29 – 2023-08-01 (×7): 25 mg via ORAL
  Filled 2023-07-29 (×12): qty 1

## 2023-07-29 NOTE — Progress Notes (Signed)
PROGRESS NOTE   Subjective/Complaints:  Able to stand this weekend (on spasticitty). Both legs still tight. Tizanidine increased yesterday. Feels awake, sleeping fairly well   ROS: Patient denies fever, rash, sore throat, blurred vision, dizziness, nausea, vomiting, diarrhea, cough, shortness of breath or chest pain,  headache, or mood change.     Objective:   No results found.   Recent Labs    07/27/23 1634 07/29/23 0558  WBC 6.3 6.7  HGB 11.0* 10.4*  HCT 32.6* 29.4*  PLT 450* 431*    Recent Labs    07/29/23 0558  NA 137  K 3.9  CL 105  CO2 25  GLUCOSE 96  BUN 15  CREATININE 0.91  CALCIUM 9.4      Intake/Output Summary (Last 24 hours) at 07/29/2023 1053 Last data filed at 07/29/2023 0245 Gross per 24 hour  Intake 220 ml  Output 1500 ml  Net -1280 ml     Pressure Injury 07/17/23 Heel Left Stage 2 -  Partial thickness loss of dermis presenting as a shallow open injury with a red, pink wound bed without slough. blister that has ruptured with drainage (Active)  07/17/23 (as per nurse Toni Amend) 1700  Location: Heel  Location Orientation: Left  Staging: Stage 2 -  Partial thickness loss of dermis presenting as a shallow open injury with a red, pink wound bed without slough.  Wound Description (Comments): blister that has ruptured with drainage  Present on Admission: Yes     Physical Exam: Vital Signs Blood pressure 99/69, pulse 87, temperature 98.2 F (36.8 C), temperature source Axillary, resp. rate 18, height 5\' 9"  (1.753 m), weight 89.9 kg, SpO2 100%.  Constitutional: No distress . Vital signs reviewed. HEENT: NCAT, EOMI, oral membranes moist Neck: supple Cardiovascular: RRR without murmur. No JVD    Respiratory/Chest: CTA Bilaterally without wheezes or rales. Normal effort    GI/Abdomen: BS +, non-tender, non-distended Ext: no clubbing, cyanosis, or edema Psych: pleasant and cooperative  Skin:  improved swelling around surgical site. Left heel wound pink with central opening, healing Neurological: more alert after being slow initially.  Mentation appropriate. Speech clear.  MOTOR: RUE: 5/5 Deltoid, 5/5 Biceps, 5/5 Triceps,5/5 Grip LUE: 5/5 Deltoid, 5/5 Biceps, 5/5 Triceps, 5/5 Grip RLE: HF 2+/5, KE 3/5, ADF 4/5, APF 4/5 LLE: HF 2/5, KE 2/5, ADF 2/5, APF 2/5- no significant changes today 2/3 Severe hypertonia b/l LE left greater than right, 3-4/4--triggered by simple movement of lower limbs. Increased left hip adductor tone   SENSORY: Altered sensation below T4 level -    Assessment/Plan: 1. Functional deficits which require 3+ hours per day of interdisciplinary therapy in a comprehensive inpatient rehab setting. Physiatrist is providing close team supervision and 24 hour management of active medical problems listed below. Physiatrist and rehab team continue to assess barriers to discharge/monitor patient progress toward functional and medical goals  Care Tool:  Bathing    Body parts bathed by patient: Right arm, Left arm, Chest, Abdomen, Front perineal area, Face   Body parts bathed by helper: Buttocks, Right upper leg, Right lower leg, Left lower leg, Left upper leg     Bathing assist Assist Level: Maximal Assistance -  Patient 24 - 49%     Upper Body Dressing/Undressing Upper body dressing   What is the patient wearing?: Pull over shirt    Upper body assist Assist Level: Moderate Assistance - Patient 50 - 74%    Lower Body Dressing/Undressing Lower body dressing      What is the patient wearing?: Pants, Incontinence brief     Lower body assist Assist for lower body dressing: Dependent - Patient 0%     Toileting Toileting    Toileting assist Assist for toileting: 2 Helpers     Transfers Chair/bed transfer  Transfers assist  Chair/bed transfer activity did not occur: Safety/medical concerns  Chair/bed transfer assist level: 2 Helpers (with transfer  board, min-modA x2)     Locomotion Ambulation   Ambulation assist   Ambulation activity did not occur: Safety/medical concerns (spasticity and weakness)          Walk 10 feet activity   Assist  Walk 10 feet activity did not occur: Safety/medical concerns (spasticity and weakness)        Walk 50 feet activity   Assist Walk 50 feet with 2 turns activity did not occur: Safety/medical concerns (spasticity and weakness)         Walk 150 feet activity   Assist Walk 150 feet activity did not occur: Safety/medical concerns (spasticity and weakness)         Walk 10 feet on uneven surface  activity   Assist Walk 10 feet on uneven surfaces activity did not occur: Safety/medical concerns (spasticity and weakness)         Wheelchair     Assist Is the patient using a wheelchair?: Yes Type of Wheelchair: Power Wheelchair activity did not occur: Safety/medical concerns (w/c malfunction)  Wheelchair assist level: Supervision/Verbal cueing Max wheelchair distance: 150    Wheelchair 50 feet with 2 turns activity    Assist    Wheelchair 50 feet with 2 turns activity did not occur: Safety/medical concerns   Assist Level: Supervision/Verbal cueing   Wheelchair 150 feet activity     Assist  Wheelchair 150 feet activity did not occur: Safety/medical concerns   Assist Level: Supervision/Verbal cueing   Blood pressure 99/69, pulse 87, temperature 98.2 F (36.8 C), temperature source Axillary, resp. rate 18, height 5\' 9"  (1.753 m), weight 89.9 kg, SpO2 100%.  Medical Problem List and Plan: 1. Functional deficits secondary to Cervical myelopathy s/p ACDF and PCDF C6-T1 1/17 by Dr. Jake Samples             -patient may shower, please cover incision             -ELOS/Goals: 2/6, Sup to min A            --Continue CIR therapies including PT, OT . Discussed DC planning with pt/wife  - changed to 15/7  Still a difficult balance between spasticity and tolerance  of meds  2.  Antithrombotics: -DVT/anticoagulation:  Pharmaceutical: Lovenox             -antiplatelet therapy: N/A 3. Pain Management:  Tylenol 650 mg qid. Continue MS contin  15mg  w/supper and oxycodone 5mg  prn  1/23- pain mainly from spasms, but also now has new neck pain from posterior fusion- con't regimen  1/26- main spasms from spasticity- neck pain minor per pt 4. Mood/Behavior/Sleep: LCSW to follow for evaluation and support.              --was on Seroquel for sleep- restart              -  Restart cymbalta at 30mg  daily             -antipsychotic agents: Seroquel for confusion- now increased to 50mg .   -he has been more alert this am   5. Neuropsych/cognition: This patient is intermittently  capable of making decisions on his own behalf. 6. Skin/Wound Care: Routine pressure relief measures.              --neck incision cdi, seroma decreasing in size  7. Fluids/Electrolytes/Nutrition: Monitor I/O. Check CMET in am             -Continue vitamin C, prosource 8. Spasticity: Continue Valium 5 mg qid, baclofen 20 mg qid, Zanaflex 4 mg every 6 hours prn.    1/23- spasms still a major issue- will monitor for other options. Baclofen has been decreased from prior dose of 30 mg q6 hours- will see if need to increase again. 1/24- d/w pt/fiance- will increase Baclofen to 30 mg q6 hours.  - Baclofen 10 mg QID prn has been added for severe spasms not controlled with other regimen 1/29 increased dantrium to 25mg  bid. Titrate as tolerated 2/2 change tizanidine to 4mg  TID 2/3 increase dantrium to 25mg  tid as arousal is good.  -for now can utilize tone to help with standing and some mobility tasks 9. Neurogenic bladder: Cath every 4 hours to keep bladder volumes < 350 cc. Continue flomax 0.4mg              --1/23-1/25con't cathing, no matter cath volumes 5x/day  2/3 cath volumes 250-350cc 10. Neurogenic bowel: Was not on bowel program. Last BM documented on 01/18 --Will change senna to am, continue  miralax in am.  --Schedule mini enema at nights.  -Sorbitol scheduled tomorrow    1/23- hasn't had good BM's on acute, so will do sorbitol today.   1/24- had good BM last night with bowel program  1/25- didn't have any results last night- will give sorbitol to get results tonight and increase Senna to 3 tabs daily- already on Miralax daily. 1/26- NO BM with bowel program again last night- will give Sorbitol 60cc and do mini enema tonight if no BM today. Constipation is likely why spasticity is worse.    1/27 had large bm yesterday morning  1/31 had large bm yesterday at 15:45 but before admin of sorbitol/bowel program   -receiving oral meds in am and enemeez after supper without consistent results.   2/3 still without consistent results. Had bm this morning at 0800   -will give sorbitol this afternoon followed by [employer] [facility] at 2000 11. Fever:  -now afebrile, wbc's improvd  1/30 Cx + for 60k Klebsiella-  Completed cefepime  -continue bactrim tomorrow for 7 days -monitor clinically     12.  Hypotension- likely multifactorial , orthostasis, spinal cord involvement , tizanidine  Asymptomatic while in bed Vitals:   07/28/23 2057 07/29/23 0512  BP: 109/72 99/69  Pulse: 100 87  Resp: 16 18  Temp: 98.2 F (36.8 C) 98.2 F (36.8 C)  SpO2: 98% 100%       LOS: 12 days A FACE TO FACE EVALUATION WAS PERFORMED  Ranelle Oyster 07/29/2023, 10:53 AM

## 2023-07-29 NOTE — Progress Notes (Signed)
Patient ID: Leon Rodriguez, male   DOB: 22-Jan-1966, 58 y.o.   MRN: [id number]  SW received updates from [employer] reporting DME should be delivered on Wednesday.  SW met with pt s/o [name] to review DME discharge. Confirms she received updates DME should arrive on Wednesday as well. No questions/concerns reported.  [name], MSW, LCSW Office: [phone] Cell: [phone] Fax: [phone]

## 2023-07-29 NOTE — Progress Notes (Signed)
Occupational Therapy Session Note  Patient Details  Name: Leon Rodriguez MRN: [id number] Date of Birth: 1965/09/28  Today's Date: 07/29/2023 OT Individual Time: 0930-1030 OT Individual Time Calculation (min): 60 min    Short Term Goals: Week 2:  OT Short Term Goal 1 (Week 2): STG=LTG (downgraded) 2/2 ELOS  Skilled Therapeutic Interventions/Progress Updates:    Pt seated EOB upon arrival with SO present for educaiton. Reviewed and demonstrated operation of manual hoyer. Sling not available so unable to practice with pt and SO. SO assisted pt with TB transfer EOB>PWC. No safety issues noted. Pt and SO communicate well. Pt transitioned to ADL apartment to practice accessing refrigerator and items on counter top. Reviewed kitchen safety. Pt and SO verbalized understanding. Pt returned to room and remained in PWC. Pt and SO looking forward to d/c 2/6.   Therapy Documentation Precautions:  Precautions Precautions: Fall, Cervical Precaution Comments: severe muscle spasms, hard collar for comfort Required Braces or Orthoses: Cervical Brace Cervical Brace: Hard collar Restrictions Weight Bearing Restrictions Per Provider Order: No Other Position/Activity Restrictions: per [name] PA, Pt can wear cervical brace PRN as on 1/20   Pain:  Ongoing BLE spasms; stretching and repositioning   Therapy/Group: Individual Therapy  Rich Brave 07/29/2023, 12:09 PM

## 2023-07-29 NOTE — Plan of Care (Signed)
Pt is alert and oriented x 4. Denied pain. Vitals stable. Spasm severe during overnight prn dose of baclofen given. In and out cath at 2030- 350 cc 2330-250cc. 0230-250cc 0530- 500 cc.  Problem: Consults Goal: RH SPINAL CORD INJURY PATIENT EDUCATION Description:  See Patient Education module for education specifics.  Outcome: Progressing   Problem: SCI BOWEL ELIMINATION Goal: RH STG MANAGE BOWEL WITH ASSISTANCE Description: STG Manage Bowel with medication with min Assistance. Outcome: Progressing Goal: RH STG SCI MANAGE BOWEL WITH MEDICATION WITH ASSISTANCE Description: STG SCI Manage bowel with medication with assistance. Outcome: Progressing   Problem: SCI BLADDER ELIMINATION Goal: RH STG MANAGE BLADDER WITH ASSISTANCE Description: STG Manage Bladder medications with minimal Assistance Outcome: Progressing Goal: RH STG MANAGE BLADDER WITH MEDICATION WITH ASSISTANCE Description: STG Manage Bladder With Medication With Assistance. Outcome: Progressing   Problem: RH SAFETY Goal: RH STG ADHERE TO SAFETY PRECAUTIONS W/ASSISTANCE/DEVICE Description: STG Adhere to Safety Precautions With min Assistance/Device. Outcome: Progressing   Problem: RH PAIN MANAGEMENT Goal: RH STG PAIN MANAGED AT OR BELOW PT'S PAIN GOAL Description: <4 with prns Outcome: Progressing   Problem: RH KNOWLEDGE DEFICIT SCI Goal: RH STG INCREASE KNOWLEDGE OF SELF CARE AFTER SCI Outcome: Progressing

## 2023-07-29 NOTE — Progress Notes (Signed)
Physical Therapy Session Note  Patient Details  Name: Leon Rodriguez MRN: [id number] Date of Birth: January 16, 1966  Today's Date: 07/29/2023 PT Individual Time: 1100-1200 PT Individual Time Calculation (min): 60 min   Short Term Goals: Week 2:  PT Short Term Goal 1 (Week 2): STG=LTG d/t ELOS  Skilled Therapeutic Interventions/Progress Updates:    Pt received in PWC, agreeable to therapy. Pt's brother and SO present for family education. Pt navigated PWC with supervision and cues for navigation/set up only.   Session focused on education with the manual hoyer. Most appropriate sling not available, so practiced transfer with SPT vs pt. Both family members were able to lead transfer with min cueing and pt was able to step in and direct steps of transfer and safety.   Family members then performed slideboard transfer in the same manner, each performing both lead and +2 position with cues for safety and body mechanics.   Pt then returned to bed with max a slideboard transfer for time and d/t spasticity, was left with all needs in reach and alarm active.   Therapy Documentation Precautions:  Precautions Precautions: Fall, Cervical Precaution Comments: severe muscle spasms, hard collar for comfort Required Braces or Orthoses: Cervical Brace Cervical Brace: Hard collar Restrictions Weight Bearing Restrictions Per Provider Order: No Other Position/Activity Restrictions: per [name] PA, Pt can wear cervical brace PRN as on 1/20 General:       Therapy/Group: Individual Therapy  Juluis Rainier 07/29/2023, 4:04 PM

## 2023-07-30 NOTE — Plan of Care (Signed)
  Problem: SCI BOWEL ELIMINATION Goal: RH STG MANAGE BOWEL WITH ASSISTANCE Description: STG Manage Bowel with medication with min Assistance. Outcome: Progressing Goal: RH STG SCI MANAGE BOWEL WITH MEDICATION WITH ASSISTANCE Description: STG SCI Manage bowel with medication with assistance. Outcome: Progressing   Problem: SCI BLADDER ELIMINATION Goal: RH STG MANAGE BLADDER WITH ASSISTANCE Description: STG Manage Bladder medications with minimal Assistance Outcome: Progressing   Problem: RH SAFETY Goal: RH STG ADHERE TO SAFETY PRECAUTIONS W/ASSISTANCE/DEVICE Description: STG Adhere to Safety Precautions With min Assistance/Device. Outcome: Progressing   Problem: RH PAIN MANAGEMENT Goal: RH STG PAIN MANAGED AT OR BELOW PT'S PAIN GOAL Description: <4 with prns Outcome: Progressing   Problem: RH KNOWLEDGE DEFICIT SCI Goal: RH STG INCREASE KNOWLEDGE OF SELF CARE AFTER SCI Outcome: Progressing

## 2023-07-30 NOTE — Progress Notes (Incomplete)
Patient ID: Leon Rodriguez, male   DOB: Jan 11, 1966, 58 y.o.   MRN: [id number]

## 2023-07-30 NOTE — Progress Notes (Signed)
 Occupational Therapy Session Note  Patient Details  Name: Leon Rodriguez MRN: [id number] Date of Birth: 20-May-1966  Today's Date: 07/30/2023 OT Individual Time: 0930-1055 OT Individual Time Calculation (min): 85 min    Short Term Goals: Week 2:  OT Short Term Goal 1 (Week 2): STG=LTG (downgraded) 2/2 ELOS  Skilled Therapeutic Interventions/Progress Updates:    OT intervention with focus on TB transfers, sitting balance, bathing at shower level, dressing at bed level, and activity tolerance to increase independence with BADLs and reduce burden of care at discharge. TB transfer to rolling shower chair with mod A. Bathing with mod A seated in chair. Pt's SO bathed pt again for thoroughness. Pt with ongoing spasms while seated but able to maintain supported sitting balance safely. Pt required tot A+2 for TB transfer back to bed. Pt with increased spasms preventing adequate lateral leans to position chuck pad prior to TB transfers. +2 provided support to allow positioning of pad. Dressing completed at bed level. Pt and SO commented on how exhausting taking a shower currently is. Pt remained in bed with SO present. All needs within reach.   Therapy Documentation Precautions:  Precautions Precautions: Fall, Cervical Precaution Comments: severe muscle spasms, hard collar for comfort Required Braces or Orthoses: Cervical Brace Cervical Brace: Hard collar Restrictions Weight Bearing Restrictions Per Provider Order: No Other Position/Activity Restrictions: per Camie Pickle PA, Pt can wear cervical brace PRN as on 1/20   Pain:  Ongoing BLE spasms with accompanying pain; gentle stretching and repositioning  Therapy/Group: Individual Therapy  Leon Rodriguez 07/30/2023, 12:00 PM

## 2023-07-30 NOTE — Progress Notes (Signed)
 PROGRESS NOTE   Subjective/Complaints:  Pt reports a little sleepy, but no worse than normal- with increase in spasticity meds.   Spasms still really bad, daily- no change so far.  RLE starting to stay up- won't stay straight.   Sore on foot getting better.  Bowels going well and cathing.  Iodine peeling skin- asked nursing to get chlorhexadine.   Swelling in neck much better.     ROS:   Pt denies SOB, abd pain, CP, N/V/C/D, and vision changes     Objective:   No results found.   Recent Labs    07/27/23 1634 07/29/23 0558  WBC 6.3 6.7  HGB 11.0* 10.4*  HCT 32.6* 29.4*  PLT 450* 431*    Recent Labs    07/29/23 0558  NA 137  K 3.9  CL 105  CO2 25  GLUCOSE 96  BUN 15  CREATININE 0.91  CALCIUM 9.4      Intake/Output Summary (Last 24 hours) at 07/30/2023 1022 Last data filed at 07/30/2023 0539 Gross per 24 hour  Intake 118 ml  Output 2400 ml  Net -2282 ml     Pressure Injury 07/17/23 Heel Left Stage 2 -  Partial thickness loss of dermis presenting as a shallow open injury with a red, pink wound bed without slough. blister that has ruptured with drainage (Active)  07/17/23 (as per nurse Charmaine) 1700  Location: Heel  Location Orientation: Left  Staging: Stage 2 -  Partial thickness loss of dermis presenting as a shallow open injury with a red, pink wound bed without slough.  Wound Description (Comments): blister that has ruptured with drainage  Present on Admission: Yes     Physical Exam: Vital Signs Blood pressure 103/70, pulse 83, temperature (!) 97.5 F (36.4 C), resp. rate 18, height 5' 9 (1.753 m), weight 89.9 kg, SpO2 100%.    General: awake, alert, appropriate, NAD HENT: conjugate gaze; oropharynx moist- neck seroma looks almost gone- suture sin place CV: regular rate and rhythm; no JVD Pulmonary: CTA B/L; no W/R/R- good air movement GI: soft, NT, ND, (+)BS Psychiatric:  appropriate Neurological: Ox3 Spasticity looks worse, in spite of  increased meds- MAS of 3-4 and RLE won't straighten easily or stay that way- also spasms full body intermittently Skin: improved swelling around surgical site. Left heel wound pink with central opening, healing Neurological: more alert after being slow initially.  Mentation appropriate. Speech clear.  MOTOR: RUE: 5/5 Deltoid, 5/5 Biceps, 5/5 Triceps,5/5 Grip LUE: 5/5 Deltoid, 5/5 Biceps, 5/5 Triceps, 5/5 Grip RLE: HF 2+/5, KE 3/5, ADF 4/5, APF 4/5 LLE: HF 2/5, KE 2/5, ADF 2/5, APF 2/5- no significant changes today 2/3 Severe hypertonia b/l LE left greater than right, 3-4/4--triggered by simple movement of lower limbs. Increased left hip adductor tone   SENSORY: Altered sensation below T4 level -    Assessment/Plan: 1. Functional deficits which require 3+ hours per day of interdisciplinary therapy in a comprehensive inpatient rehab setting. Physiatrist is providing close team supervision and 24 hour management of active medical problems listed below. Physiatrist and rehab team continue to assess barriers to discharge/monitor patient progress toward functional and medical goals  Care Tool:  Bathing    Body parts bathed by patient: Right arm, Left arm, Chest, Abdomen, Front perineal area, Face   Body parts bathed by helper: Buttocks, Right upper leg, Right lower leg, Left lower leg, Left upper leg     Bathing assist Assist Level: Maximal Assistance - Patient 24 - 49%     Upper Body Dressing/Undressing Upper body dressing   What is the patient wearing?: Pull over shirt    Upper body assist Assist Level: Moderate Assistance - Patient 50 - 74%    Lower Body Dressing/Undressing Lower body dressing      What is the patient wearing?: Pants, Incontinence brief     Lower body assist Assist for lower body dressing: Dependent - Patient 0%     Toileting Toileting    Toileting assist Assist for toileting: 2  Helpers     Transfers Chair/bed transfer  Transfers assist  Chair/bed transfer activity did not occur: Safety/medical concerns  Chair/bed transfer assist level: 2 Helpers (with transfer board, min-modA x2)     Locomotion Ambulation   Ambulation assist   Ambulation activity did not occur: Safety/medical concerns (spasticity and weakness)          Walk 10 feet activity   Assist  Walk 10 feet activity did not occur: Safety/medical concerns (spasticity and weakness)        Walk 50 feet activity   Assist Walk 50 feet with 2 turns activity did not occur: Safety/medical concerns (spasticity and weakness)         Walk 150 feet activity   Assist Walk 150 feet activity did not occur: Safety/medical concerns (spasticity and weakness)         Walk 10 feet on uneven surface  activity   Assist Walk 10 feet on uneven surfaces activity did not occur: Safety/medical concerns (spasticity and weakness)         Wheelchair     Assist Is the patient using a wheelchair?: Yes Type of Wheelchair: Power Wheelchair activity did not occur: Safety/medical concerns (w/c malfunction)  Wheelchair assist level: Supervision/Verbal cueing Max wheelchair distance: 150    Wheelchair 50 feet with 2 turns activity    Assist    Wheelchair 50 feet with 2 turns activity did not occur: Safety/medical concerns   Assist Level: Supervision/Verbal cueing   Wheelchair 150 feet activity     Assist  Wheelchair 150 feet activity did not occur: Safety/medical concerns   Assist Level: Supervision/Verbal cueing   Blood pressure 103/70, pulse 83, temperature (!) 97.5 F (36.4 C), resp. rate 18, height 5' 9 (1.753 m), weight 89.9 kg, SpO2 100%.  Medical Problem List and Plan: 1. Functional deficits secondary to Cervical myelopathy s/p ACDF and PCDF C6-T1 1/17 by Dr. Carollee             -patient may shower, please cover incision             -ELOS/Goals: 2/6, Sup to min  A            --Continue CIR therapies including PT, OT . Discussed DC planning with pt/wife  -Con't CIR PT and OT- d/c Thursday  Team conference today to finalize d/c.  2.  Antithrombotics: -DVT/anticoagulation:  Pharmaceutical: Lovenox              -antiplatelet therapy: N/A 3. Pain Management:  Tylenol  650 mg qid. Continue MS contin   15mg  w/supper and oxycodone  5mg  prn  1/23- pain mainly from spasms, but also now has new neck pain from  posterior fusion- con't regimen  1/26- main spasms from spasticity- neck pain minor per pt 4. Mood/Behavior/Sleep: LCSW to follow for evaluation and support.              --was on Seroquel  for sleep- restart              -Restart cymbalta  at 30mg  daily             -antipsychotic agents: Seroquel  for confusion- now increased to 50mg .   -he has been more alert this am   5. Neuropsych/cognition: This patient is intermittently  capable of making decisions on his own behalf. 6. Skin/Wound Care: Routine pressure relief measures.              --neck incision cdi, seroma decreasing in size  7. Fluids/Electrolytes/Nutrition: Monitor I/O. Check CMET in am             -Continue vitamin C , prosource 8. Spasticity: Continue Valium  5 mg qid, baclofen  20 mg qid, Zanaflex  4 mg every 6 hours prn.    1/23- spasms still a major issue- will monitor for other options. Baclofen  has been decreased from prior dose of 30 mg q6 hours- will see if need to increase again. 1/24- d/w pt/fiance- will increase Baclofen  to 30 mg q6 hours.  - Baclofen  10 mg QID prn has been added for severe spasms not controlled with other regimen 1/29 increased dantrium  to 25mg  bid. Titrate as tolerated 2/2 change tizanidine  to 4mg  TID 2/3 increase dantrium  to 25mg  tid as arousal is good.  -for now can utilize tone to help with standing and some mobility tasks 2/4- will arrange for Dr Emeline to see for phenol and Botox  after d/c and to see me.  9. Neurogenic bladder: Cath every 4 hours to keep bladder  volumes < 350 cc. Continue flomax  0.4mg              --1/23-1/25con't cathing, no matter cath volumes 5x/day  2/3 cath volumes 250-350cc 10. Neurogenic bowel: Was not on bowel program. Last BM documented on 01/18 --Will change senna to am, continue miralax  in am.  --Schedule mini enema at nights.  -Sorbitol  scheduled tomorrow    1/23- hasn't had good BM's on acute, so will do sorbitol  today.   1/24- had good BM last night with bowel program  1/25- didn't have any results last night- will give sorbitol  to get results tonight and increase Senna to 3 tabs daily- already on Miralax  daily. 1/26- NO BM with bowel program again last night- will give Sorbitol  60cc and do mini enema tonight if no BM today. Constipation is likely why spasticity is worse.    1/27 had large bm yesterday morning  1/31 had large bm yesterday at 15:45 but before admin of sorbitol /bowel program   -receiving oral meds in am and enemeez after supper without consistent results.   2/3 still without consistent results. Had bm this morning at 0800   -will give sorbitol  this afternoon followed by [facility] at 2000  2/4- Bowels working well per wife 11. Fever:  -now afebrile, wbc's improvd  1/30 Cx + for 60k Klebsiella-  Completed cefepime   -continue bactrim  tomorrow for 7 days -monitor clinically      12.  Hypotension- likely multifactorial , orthostasis, spinal cord involvement , tizanidine   Asymptomatic while in bed Vitals:   07/29/23 1957 07/30/23 0557  BP: 130/79 103/70  Pulse: (!) 105 83  Resp: 18 18  Temp: 97.9 F (36.6 C) (!) 97.5 F (  36.4 C)  SpO2: 98% 100%     I spent a total of 36   minutes on total care today- >50% coordination of care- due to  Will do team conference to finalize d/c- also d/w nursing about chlorhexadine for penis- because peeling skin from iodine- also review of new meds changes while I was gone and labs, vitals, and chart.   LOS: 13 days A FACE TO FACE EVALUATION WAS  PERFORMED  Rohnan Bartleson 07/30/2023, 10:22 AM

## 2023-07-30 NOTE — Progress Notes (Signed)
 Physical Therapy Session Note  Patient Details  Name: Leon Rodriguez MRN: [id number] Date of Birth: Aug 15, 1965  Today's Date: 07/30/2023 PT Individual Time: 1305-1400 PT Individual Time Calculation (min): 55 min   Short Term Goals: Week 1:  PT Short Term Goal 1 (Week 1): Pt will require max A with rolling in bilateral directions PT Short Term Goal 1 - Progress (Week 1): Met (using bed rails) PT Short Term Goal 2 (Week 1): Pt will direct caregiver with dependent transfer set-up PT Short Term Goal 2 - Progress (Week 1): Progressing toward goal PT Short Term Goal 3 (Week 1): Pt will require min A with power wheelchair mobility 50 ft PT Short Term Goal 3 - Progress (Week 1): Met  Skilled Therapeutic Interventions/Progress Updates:    pt received in bed and agreeable to therapy. Pt with no new c/o pain. Pt assisted with bed mobility with mod a for time management and slideboard transfer with max a and +2 for some scoots d/t increased spasms. Pt navigated PWC with supervision during session. Therapist also provided manual stretching at end of session for tone management, with pt demoing improved positioning in chair and decr spasm pain after intervention. Pt remained in w/c at end of session with his SO present.  Session focused on w/c evaluation with Penne Matsu, [employer]. Mr. Isham requires the functions of a group 3 power wheelchair to allow for safe transfers, to allow for self-cathing, and to manage spasticity. He requires seat elevation to allow for transfers to various height surfaces in his home and to participate in community level activities. He will require tilt, recline, and elevating leg rests for pressure relief because he has a high risk of skin breakdown. Mr. Alexopoulos will also use these features has he self-caths and to manage spasticity. A manual wheelchair is not appropriate for Mr. Belli because his fluctuating spasticity and tone force him to slide out of the chair and caused pain due  to lack of adjustability. In a chair without the above-mentioned features, Mr. Spadafore was only able to tolerate 1-2 hours without pain. Mr. Abdallah requires elevating leg rests to promote appropriate positioning for self-cathing and reduce edema. Mr. Conley requires the Multiple Seat Function Control Kit to allow independent control their of the prescribed power seat functions from the drive control and/or seat function interface. It includes a function selection switch that allows him to select the seat function required and an indicator feature for visual feedback of the selection. Mr. Crass requires a hybrid air cushion for pressure relief and ease of transfers using a slideboard.  Mr. mostafa yuan the appropriate cognitive abilities to use the above-described equipment appropriately.   Therapy Documentation Precautions:  Precautions Precautions: Fall, Cervical Precaution Comments: severe muscle spasms, hard collar for comfort Required Braces or Orthoses: Cervical Brace Cervical Brace: Hard collar Restrictions Weight Bearing Restrictions Per Provider Order: No Other Position/Activity Restrictions: per Camie Pickle PA, Pt can wear cervical brace PRN as on 1/20 General:       Therapy/Group: Individual Therapy  [name] 07/30/2023, 3:28 PM

## 2023-07-30 NOTE — Progress Notes (Signed)
 Patient seen and examined.  No new complaints today.  Incisions are well-healed.  Spasms are stable, neuroexam is stable.  He is excited to be returning home soon.  I will have him follow-up with me in 2 to 3 weeks after discharge.  He states he was able to stand yesterday and perform physical therapy, continues to make slow progress.

## 2023-07-30 NOTE — Patient Care Conference (Signed)
 Inpatient RehabilitationTeam Conference and Plan of Care Update Date: 07/30/2023   Time: 11:19 AM    Patient Name: Leon Rodriguez      Medical Record Number: [id number]  Date of Birth: 09/22/1965 Sex: Male         Room/Bed: 4W20C/4W20C-01 Payor Info: Payor: [place] MEDICAID PREPAID HEALTH PLAN / Plan: [place] MEDICAID HEALTHY BLUE / Product Type: *No Product type* /    Admit Date/Time:  07/17/2023  4:19 PM  Primary Diagnosis:  Cervical myelopathy [facility])  Hospital Problems: Principal Problem:   Cervical myelopathy [facility])    Expected Discharge Date: Expected Discharge Date: 08/01/23  Team Members Present: Physician leading conference: Dr. Duwaine Barrs Social Worker Present: Graeme Jude, LCSWA Nurse Present: Barnie Ronde, RN;Angelina Sula, Darlin Boring, RN PT Present: Schuyler Batter, PT OT Present: Delon Sharps, OT;Charlena Cha, COTA PPS Coordinator present : Burnard Mealing, OT     Current Status/Progress Goal Weekly Team Focus  Bowel/Bladder   Pt is incontinent of b/b. q6 caths for no void   Regain continence   Assist with time toileting    Swallow/Nutrition/ Hydration               ADL's   SO currently assisting pt with BADLs at bed level; TB transfers-min A/mod A; education with SO complete   UB dressing-supervision; dynamic sitting balance-supervision, bathing-mod A; direct care-supervision   TB transfers, bed mobility, education, discharge planning    Mobility   limited by tone, family education ongoing, w/c eval sceduled Tuesday.   mod A, care direction  family ed, transfers, tone management    Communication                Safety/Cognition/ Behavioral Observations               Pain   pt c/o of pain in bilateral LE. prn/ scheduled pain meds given   <3 pain score   Assess qshift and prn    Skin    Stage II to left heel. Incision to neck. Blister to left ankle.  Multiple skin tears to buttocks.    maintain skin intergrity  Assess qshift and  prn      Discharge Planning:  D/c to home with s/o [name]. She is the only caregiver. Reports her dtr can help PRN some evenings. Disability referral sent to [facility]. PCS referral sent from previous admission to insurance- family needs to follow-up to schedule assessment. [facility] for HHPT/OT/aide. DME with [employer] for hospital bed, hoyer lift, DABSC, and TTB and to be delievered on Wednesday. Specialty w/c from [facility].  SW will confirm there are no barriers to discharge.   Team Discussion: Cervical myelopathy. Continue bowel/bladder program.  Antibiotic tx for UTI. Managing stage II to left heel. Neck incision is healing. Spasticity is barrier.  Patient on target to meet rehab goals: yes, goals adjusted and now on track to meet goals with discharge date of 08/01/2023  *See Care Plan and progress notes for long and short-term goals.   Revisions to Treatment Plan:  Spasticity medication adjustments.  Dantrolene  increased. Zanaflex  added. Botox  injections at discharge. I/O cath every 4 hours. Power wheelchair evaluation this afternoon.  Teaching Needs: Medications, safety, self care, transfers, toileting, skin/wound care, etc   Current Barriers to Discharge: Decreased caregiver support, Neurogenic bowel and bladder, and Wound care  Possible Resolutions to Barriers: Family education Independent with bowel/bladder management Independent with skin/wound care Order recommended DME      Medical Summary  Current Status: self cathing can change to q4 hours- and Bowel program nightly- and L foot ulcer-family trained on meds- severe spasticity  Barriers to Discharge: Behavior/Mood;Complicated Wound;Neurogenic Bowel & Bladder;Incontinence;Self-care education;Spasticity;Weight bearing restrictions;Uncontrolled Pain  Barriers to Discharge Comments: limited by weakness, severe spasticity even on 4 meds; sleepiness due to meds, neurogenic bowel and bladder Possible  Resolutions to Barriers/Weekly Focus: 4 meds for spasticity- willa rrange for Dr Emeline appt and Dr Cornelio and  PCP and NSU-   d/c 2/6   Continued Need for Acute Rehabilitation Level of Care: The patient requires daily medical management by a physician with specialized training in physical medicine and rehabilitation for the following reasons: Direction of a multidisciplinary physical rehabilitation program to maximize functional independence : Yes Medical management of patient stability for increased activity during participation in an intensive rehabilitation regime.: Yes Analysis of laboratory values and/or radiology reports with any subsequent need for medication adjustment and/or medical intervention. : Yes   I attest that I was present, lead the team conference, and concur with the assessment and plan of the team.   Darice LITTIE Boring 07/30/2023, 7:16 PM

## 2023-07-31 ENCOUNTER — Other Ambulatory Visit (HOSPITAL_COMMUNITY): Payer: Self-pay

## 2023-07-31 ENCOUNTER — Telehealth (HOSPITAL_COMMUNITY): Payer: Self-pay | Admitting: Pharmacy Technician

## 2023-07-31 MED ORDER — DIAZEPAM 5 MG PO TABS
5.0000 mg | ORAL_TABLET | Freq: Four times a day (QID) | ORAL | 1 refills | Status: DC
  Filled 2023-07-31: qty 60, 15d supply, fill #0

## 2023-07-31 MED ORDER — TIZANIDINE HCL 2 MG PO TABS
2.0000 mg | ORAL_TABLET | Freq: Three times a day (TID) | ORAL | 0 refills | Status: DC
  Filled 2023-07-31: qty 90, 30d supply, fill #0

## 2023-07-31 MED ORDER — DANTROLENE SODIUM 25 MG PO CAPS
25.0000 mg | ORAL_CAPSULE | Freq: Three times a day (TID) | ORAL | 0 refills | Status: DC
  Filled 2023-07-31: qty 90, 30d supply, fill #0

## 2023-07-31 MED ORDER — BACLOFEN 15 MG PO TABS
30.0000 mg | ORAL_TABLET | Freq: Four times a day (QID) | ORAL | 0 refills | Status: DC
  Filled 2023-07-31: qty 205, 17d supply, fill #0

## 2023-07-31 MED ORDER — ACETAMINOPHEN 325 MG PO TABS
650.0000 mg | ORAL_TABLET | ORAL | 0 refills | Status: AC | PRN
  Filled 2023-07-31: qty 100, 9d supply, fill #0

## 2023-07-31 MED ORDER — TIZANIDINE HCL 4 MG PO TABS
2.0000 mg | ORAL_TABLET | Freq: Three times a day (TID) | ORAL | Status: DC
Start: 2023-07-31 — End: 2023-08-01
  Administered 2023-07-31 – 2023-08-01 (×4): 2 mg via ORAL
  Filled 2023-07-31 (×4): qty 1

## 2023-07-31 MED ORDER — HYDROCERIN EX CREA: 1.0000 | TOPICAL_CREAM | Freq: Two times a day (BID) | CUTANEOUS | Status: DC

## 2023-07-31 MED ORDER — ACETAMINOPHEN 325 MG PO TABS
650.0000 mg | ORAL_TABLET | Freq: Three times a day (TID) | ORAL | 0 refills | Status: DC
  Filled 2023-07-31: qty 200, 25d supply, fill #0

## 2023-07-31 MED ORDER — POLYETHYLENE GLYCOL 3350 17 GM/SCOOP PO POWD
17.0000 g | Freq: Every day | ORAL | 1 refills | Status: AC
  Filled 2023-07-31: qty 238, 14d supply, fill #0

## 2023-07-31 MED ORDER — LIDOCAINE 5 % EX PTCH
1.0000 | MEDICATED_PATCH | CUTANEOUS | 0 refills | Status: DC
  Filled 2023-07-31: qty 30, 30d supply, fill #0

## 2023-07-31 MED ORDER — SENNOSIDES-DOCUSATE SODIUM 8.6-50 MG PO TABS
3.0000 | ORAL_TABLET | Freq: Every day | ORAL | 0 refills | Status: DC
  Filled 2023-07-31: qty 90, 30d supply, fill #0

## 2023-07-31 MED ORDER — DULOXETINE HCL 30 MG PO CPEP
30.0000 mg | ORAL_CAPSULE | Freq: Every day | ORAL | 0 refills | Status: DC
  Filled 2023-07-31: qty 30, 30d supply, fill #0

## 2023-07-31 MED ORDER — TAMSULOSIN HCL 0.4 MG PO CAPS
0.4000 mg | ORAL_CAPSULE | Freq: Every day | ORAL | 0 refills | Status: DC
  Filled 2023-07-31: qty 30, 30d supply, fill #0

## 2023-07-31 MED ORDER — LIDOCAINE HCL URETHRAL/MUCOSAL 2 % EX GEL
1.0000 | Freq: Four times a day (QID) | CUTANEOUS | 1 refills | Status: DC | PRN
  Filled 2023-07-31: qty 10, 3d supply, fill #0

## 2023-07-31 MED ORDER — SIMETHICONE 80 MG PO CHEW
80.0000 mg | CHEWABLE_TABLET | Freq: Four times a day (QID) | ORAL | 0 refills | Status: DC
  Filled 2023-07-31: qty 100, 25d supply, fill #0

## 2023-07-31 MED ORDER — ASCORBIC ACID 1000 MG PO TABS
1000.0000 mg | ORAL_TABLET | Freq: Every day | ORAL | 0 refills | Status: DC
  Filled 2023-07-31: qty 30, 30d supply, fill #0

## 2023-07-31 MED ORDER — SULFAMETHOXAZOLE-TRIMETHOPRIM 800-160 MG PO TABS
1.0000 | ORAL_TABLET | Freq: Two times a day (BID) | ORAL | 0 refills | Status: AC
  Filled 2023-07-31: qty 8, 4d supply, fill #0

## 2023-07-31 MED ORDER — OXYCODONE HCL 5 MG PO TABS
5.0000 mg | ORAL_TABLET | ORAL | 0 refills | Status: DC | PRN
  Filled 2023-07-31: qty 21, 4d supply, fill #0

## 2023-07-31 MED ORDER — GUAIFENESIN ER 600 MG PO TB12: 600.0000 mg | ORAL_TABLET | Freq: Two times a day (BID) | ORAL | Status: DC

## 2023-07-31 MED ORDER — DOCUSATE SODIUM 283 MG RE ENEM
1.0000 | ENEMA | Freq: Every day | RECTAL | 0 refills | Status: AC | PRN
  Filled 2023-07-31: qty 15, 15d supply, fill #0
  Filled 2023-08-01: qty 10, 10d supply, fill #0

## 2023-07-31 MED ORDER — GUAIFENESIN ER 600 MG PO TB12: 600.0000 mg | ORAL_TABLET | Freq: Two times a day (BID) | ORAL | Status: DC | PRN

## 2023-07-31 MED ORDER — MEDIHONEY WOUND/BURN DRESSING EX PSTE: 1.0000 | PASTE | Freq: Every day | CUTANEOUS | Status: DC

## 2023-07-31 MED ORDER — BACLOFEN 10 MG PO TABS
10.0000 mg | ORAL_TABLET | Freq: Four times a day (QID) | ORAL | 0 refills | Status: DC | PRN
  Filled 2023-07-31: qty 235, 14d supply, fill #0

## 2023-07-31 MED ORDER — QUETIAPINE FUMARATE 50 MG PO TABS
50.0000 mg | ORAL_TABLET | Freq: Every day | ORAL | 0 refills | Status: DC
  Filled 2023-07-31: qty 30, 30d supply, fill #0

## 2023-07-31 NOTE — Progress Notes (Signed)
 Pt's wife stated that she would cath the patient at 0930. Upon returning to the patient's room, pt was not present and was with therapy and when questioned, the wife stated that she forgot to cath the patient. Patient now with therapy. This nurse is awaiting patient's return in order to cath him. Care ongoing.     Dreshaun Stene  VEAR Miyamoto, LPN

## 2023-07-31 NOTE — Progress Notes (Signed)
 Occupational Therapy Session Note  Patient Details  Name: Leon Rodriguez MRN: [id number] Date of Birth: [birth year]  Today's Date: 07/31/2023 OT Individual Time: 9054-8957 OT Individual Time Calculation (min): 57 min    Short Term Goals: Week 2:  OT Short Term Goal 1 (Week 2): STG=LTG (downgraded) 2/2 ELOS  Skilled Therapeutic Interventions/Progress Updates:    Pt seated EOB upon arrival with SO present. Pt reports SOB and increased spasms. BP 94/67, O2 99%, HR 114. TB transfer to [facility] Hights with mod A. Pt required assistance repositioning in PWC for comfort. PWC mobility in hallway. Reviewed home safety recommendations, AD s/o and importance of remaining on I/O schedule. Reviewed HEP. Pt returned to room and remained in PWC with SO present.   Therapy Documentation Precautions:  Precautions Precautions: Fall, Cervical Precaution Comments: severe muscle spasms, hard collar for comfort Required Braces or Orthoses: Cervical Brace Cervical Brace: Hard collar Restrictions Weight Bearing Restrictions Per Provider Order: No Other Position/Activity Restrictions: per Camie Pickle PA, Pt can wear cervical brace PRN as on 1/20   Pain:  Pt with increased BLE spasms today; repositioning   Therapy/Group: Individual Therapy  Maritza Debby Mare 07/31/2023, 10:43 AM

## 2023-07-31 NOTE — Progress Notes (Addendum)
 Patient ID: Leon Rodriguez, male   DOB: [birth year], 58 y.o.   MRN: [id number]  SW met with pt and pt s/o to discuss hospital bed delivery. Reports it will be delivered today. She will go to the home to make sure she does not miss delivery. SW received [employer]DA application. SW provided to [employer] for completion.   SW has [employer] application and will fax when having clinicals for today from attending.   *SW called her later to review ambulance transport home tomorrow, and will need to schedule tomorrow morning due to insurance. SW shared the same updates with pt and medical team.    Graeme Jude, MSW, LCSW Office: [phone] Cell: [phone] Fax: [phone]

## 2023-07-31 NOTE — Progress Notes (Signed)
 PROGRESS NOTE   Subjective/Complaints:  No actual neck pain anymore- just spasticity pain- we discussed coming off MS Contin - can still take Oxy if needed.  No real improvement with zanaflex  on spasticity, but more sleepy/sedated-  Pain ranges 1-10/10 with spasticity  ROS:    Pt denies SOB, abd pain, CP, N/V/C/D, and vision changes      Objective:   No results found.   Recent Labs    07/29/23 0558  WBC 6.7  HGB 10.4*  HCT 29.4*  PLT 431*    Recent Labs    07/29/23 0558  NA 137  K 3.9  CL 105  CO2 25  GLUCOSE 96  BUN 15  CREATININE 0.91  CALCIUM 9.4      Intake/Output Summary (Last 24 hours) at 07/31/2023 1847 Last data filed at 07/31/2023 1630 Gross per 24 hour  Intake --  Output 1850 ml  Net -1850 ml     Pressure Injury 07/17/23 Heel Left Stage 2 -  Partial thickness loss of dermis presenting as a shallow open injury with a red, pink wound bed without slough. blister that has ruptured with drainage (Active)  07/17/23 (as per nurse Leon Rodriguez) 1700  Location: Heel  Location Orientation: Left  Staging: Stage 2 -  Partial thickness loss of dermis presenting as a shallow open injury with a red, pink wound bed without slough.  Wound Description (Comments): blister that has ruptured with drainage  Present on Admission: Yes     Physical Exam: Vital Signs Blood pressure 132/84, pulse (!) 103, temperature 97.9 F (36.6 C), resp. rate 18, height 5' 9 (1.753 m), weight 89.9 kg, SpO2 100%.     General: awake, alert, appropriate,sleepy; fiance at bedside;  NAD HENT: conjugate gaze; oropharynx moist CV: regular rate and rhythm; no JVD Pulmonary: CTA B/L; no W/R/R- good air movement GI: soft, NT, ND, (+)BS Psychiatric: appropriate- interactive, but sleepy Neurological: Ox3 But more sleepy today- c/o being sluggish Spasticity looks worse, in spite of  increased meds- MAS of 3-4 and RLE won't  straighten easily or stay that way- also spasms full body intermittently Skin: improved swelling around surgical site. Left heel wound pink with central opening, healing Neurological: more alert after being slow initially.  Mentation appropriate. Speech clear.  MOTOR: RUE: 5/5 Deltoid, 5/5 Biceps, 5/5 Triceps,5/5 Grip LUE: 5/5 Deltoid, 5/5 Biceps, 5/5 Triceps, 5/5 Grip RLE: HF 2+/5, KE 3/5, ADF 4/5, APF 4/5 LLE: HF 2/5, KE 2/5, ADF 2/5, APF 2/5- no significant changes today 2/3 Severe hypertonia b/l LE left greater than right, 3-4/4--triggered by simple movement of lower limbs. Increased left hip adductor tone   SENSORY: Altered sensation below T4 level -    Assessment/Plan: 1. Functional deficits which require 3+ hours per day of interdisciplinary therapy in a comprehensive inpatient rehab setting. Physiatrist is providing close team supervision and 24 hour management of active medical problems listed below. Physiatrist and rehab team continue to assess barriers to discharge/monitor patient progress toward functional and medical goals  Care Tool:  Bathing    Body parts bathed by patient: Right arm, Left arm, Chest, Abdomen, Front perineal area   Body parts bathed by helper: Buttocks, Right  upper leg, Right lower leg, Left lower leg, Left upper leg Body parts n/a: Buttocks, Left upper leg, Right upper leg, Right lower leg, Left lower leg   Bathing assist Assist Level: Moderate Assistance - Patient 50 - 74%     Upper Body Dressing/Undressing Upper body dressing   What is the patient wearing?: Pull over shirt    Upper body assist Assist Level: Supervision/Verbal cueing    Lower Body Dressing/Undressing Lower body dressing      What is the patient wearing?: Pants, Incontinence brief     Lower body assist Assist for lower body dressing: Dependent - Patient 0%     Toileting Toileting    Toileting assist Assist for toileting: Dependent - Patient 0%      Transfers Chair/bed transfer  Transfers assist  Chair/bed transfer activity did not occur: Safety/medical concerns  Chair/bed transfer assist level: Moderate Assistance - Patient 50 - 74% (+2 present for safety)     Locomotion Ambulation   Ambulation assist   Ambulation activity did not occur: Safety/medical concerns          Walk 10 feet activity   Assist  Walk 10 feet activity did not occur: Safety/medical concerns        Walk 50 feet activity   Assist Walk 50 feet with 2 turns activity did not occur: Safety/medical concerns         Walk 150 feet activity   Assist Walk 150 feet activity did not occur: Safety/medical concerns         Walk 10 feet on uneven surface  activity   Assist Walk 10 feet on uneven surfaces activity did not occur: Safety/medical concerns         Wheelchair     Assist Is the patient using a wheelchair?: Yes Type of Wheelchair: Power Wheelchair activity did not occur: Safety/medical concerns (w/c malfunction)  Wheelchair assist level: Set up assist Max wheelchair distance: 300    Wheelchair 50 feet with 2 turns activity    Assist    Wheelchair 50 feet with 2 turns activity did not occur: Safety/medical concerns   Assist Level: Independent   Wheelchair 150 feet activity     Assist  Wheelchair 150 feet activity did not occur: Safety/medical concerns   Assist Level: Independent   Blood pressure 132/84, pulse (!) 103, temperature 97.9 F (36.6 C), resp. rate 18, height 5' 9 (1.753 m), weight 89.9 kg, SpO2 100%.  Medical Problem List and Plan: 1. Functional deficits secondary to Cervical myelopathy s/p ACDF and PCDF C6-T1 1/17 by Dr. Carollee             -patient may shower, please cover incision             -ELOS/Goals: 2/6, Sup to min A            --Continue CIR therapies including PT, OT . Discussed DC planning with pt/wife  -Con't CIR PT and OT- d/c Thursday   2.   Antithrombotics: -DVT/anticoagulation:  Pharmaceutical: Lovenox              -antiplatelet therapy: N/A 3. Pain Management:  Tylenol  650 mg qid. Continue MS contin   15mg  w/supper and oxycodone  5mg  prn  1/23- pain mainly from spasms, but also now has new neck pain from posterior fusion- con't regimen  1/26- main spasms from spasticity- neck pain minor per pt  2/5- will stop MS Contin - let him go home with Oxy as needed for neck pain- most of pain  from spasticity, but long acting pain meds are not needed for this type of pain 4. Mood/Behavior/Sleep: LCSW to follow for evaluation and support.              --was on Seroquel  for sleep- restart              -Restart cymbalta  at 30mg  daily             -antipsychotic agents: Seroquel  for confusion- now increased to 50mg .   -he has been more alert this am   5. Neuropsych/cognition: This patient is intermittently  capable of making decisions on his own behalf. 6. Skin/Wound Care: Routine pressure relief measures.              --neck incision cdi, seroma decreasing in size  7. Fluids/Electrolytes/Nutrition: Monitor I/O. Check CMET in am             -Continue vitamin C , prosource 8. Spasticity: Continue Valium  5 mg qid, baclofen  20 mg qid, Zanaflex  4 mg every 6 hours prn.    1/23- spasms still a major issue- will monitor for other options. Baclofen  has been decreased from prior dose of 30 mg q6 hours- will see if need to increase again. 1/24- d/w pt/fiance- will increase Baclofen  to 30 mg q6 hours.  - Baclofen  10 mg QID prn has been added for severe spasms not controlled with other regimen 1/29 increased dantrium  to 25mg  bid. Titrate as tolerated 2/2 change tizanidine  to 4mg  TID 2/3 increase dantrium  to 25mg  tid as arousal is good.  -for now can utilize tone to help with standing and some mobility tasks 2/4- will arrange for Dr Emeline to see for phenol and Botox  after d/c and to see me. 2/5- decreased Zanaflex  to 2 mg TID since more sedating  9.  Neurogenic bladder: Cath every 4 hours to keep bladder volumes < 350 cc. Continue flomax  0.4mg              --1/23-1/25con't cathing, no matter cath volumes 5x/day  2/3 cath volumes 250-350cc  2/5- changed back to q4 hours cathing 10. Neurogenic bowel: Was not on bowel program. Last BM documented on 01/18 --Will change senna to am, continue miralax  in am.  --Schedule mini enema at nights.  -Sorbitol  scheduled tomorrow    1/23- hasn't had good BM's on acute, so will do sorbitol  today.   1/24- had good BM last night with bowel program  1/25- didn't have any results last night- will give sorbitol  to get results tonight and increase Senna to 3 tabs daily- already on Miralax  daily. 1/26- NO BM with bowel program again last night- will give Sorbitol  60cc and do mini enema tonight if no BM today. Constipation is likely why spasticity is worse.    1/27 had large bm yesterday morning  1/31 had large bm yesterday at 15:45 but before admin of sorbitol /bowel program   -receiving oral meds in am and enemeez after supper without consistent results.   2/3 still without consistent results. Had bm this morning at 0800   -will give sorbitol  this afternoon followed by [employer] [facility] at 2000  2/4-2/5 Bowels working well per wife 11. Fever:  -now afebrile, wbc's improvd  1/30 Cx + for 60k Klebsiella-  Completed cefepime   -continue bactrim  tomorrow for 7 days -monitor clinically      12.  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 Leon Rodriguez  will also use these features has he self-caths and to manage spasticity. A manual wheelchair is not appropriate for Leon Rodriguez because his fluctuating spasticity and tone force him to slide out of the chair and caused pain due to lack of adjustability. In a chair without the above-mentioned features, Leon Rodriguez was only able to tolerate 1-2 hours without pain. Leon Rodriguez requires elevating leg rests to promote appropriate positioning for self-cathing and reduce edema. Leon Rodriguez requires the Multiple Seat Function Control Kit to allow independent control their of the prescribed power seat functions from the drive control and/or seat function interface. It includes a function selection switch that allows him to select the seat function required and an indicator feature for visual feedback of the selection. Leon Rodriguez requires a hybrid air cushion for pressure relief and ease of transfers using a slideboard. Leon Rodriguez the appropriate cognitive abilities to use the above-described equipment appropriately.   Patient requires incontinence supplies due to bowel and bladder incontinence- and bowel program from neurogenic bowel and bladder due to incomplete quadriplegia- he needs chucks, gloves, lube, etc to get him cleaned up at least 1-3x/day- he also needs gloves for cathing. This is a permanent condition- and will not improve to the point he won't need these supplies- will need for >1 year.   Patient requires in/out caths- catheters- due to urinary retention from neurogenic bladder-from his incomplete quadriplegia he has a permanent condition, so will will require catheters 16 french coude' 6x/day- so 180 per month- he is cathed by his fiance', and is unable to cath himself.  He will need catheters for at least 1 year.    I spent a total of  59  minutes on total care today- >50% coordination of care- due to spoke to insurance to get pt cleared for Quetiapine  and Dantrolene - also d/w pt and fiance- will  stop MS Contin  since sedating and need to reduce sedation- also will reduce Zanaflex  to 2 mg TID since he doesn't feel it helped much, but more sedating.  Also spoke with SW and PA about pt care.   LOS: 14 days A FACE TO FACE EVALUATION WAS PERFORMED  Leon Rodriguez 07/31/2023, 6:47 PM

## 2023-07-31 NOTE — Progress Notes (Signed)
 Physical Therapy Discharge Summary  Patient Details  Name: ATHA MCBAIN MRN: [id number] Date of Birth: 1966-01-26  Date of Discharge from PT service:July 31, 2023  Today's Date: 07/31/2023 PT Individual Time: 1440-1535 PT Individual Time Calculation (min): 55 min    Patient has met 2 of 3 long term goals due to improved activity tolerance, improved balance, increased strength, increased range of motion, decreased pain, and improved coordination.  Patient to discharge at a wheelchair level Mod Assist.   Patient's care partner is independent to provide the necessary physical assistance at discharge.  Reasons goals not met: Pt did not meet goal of CGA for bed mobility due to continued LE weakness and severeity of spasms limiting pt's progression. Pt able to roll with minA and completed supine to/from sit with consistent modA.   Recommendation:  Patient will benefit from ongoing skilled PT services in home health setting to continue to advance safe functional mobility, address ongoing impairments in tone management, transfers, standing tolerance, and minimize fall risk.  Equipment: Specialty w/c through [employer]   Reasons for discharge: treatment goals met and discharge from hospital  Patient/family agrees with progress made and goals achieved: Yes  PT Discharge Precautions/Restrictions Precautions Precautions: Fall;Cervical Precaution Comments: severe muscle spasms Restrictions Weight Bearing Restrictions Per Provider Order: No Vital Signs Therapy Vitals Temp: 97.9 F (36.6 C) Pulse Rate: (!) 103 Resp: 18 BP: 132/84 Patient Position (if appropriate): Lying Oxygen Therapy SpO2: 100 % O2 Device: Room Air Pain Pain Assessment Faces Pain Scale: Hurts a little bit Pain Interference Pain Interference Pain Effect on Sleep: 4. Almost constantly Pain Interference with Therapy Activities: 3. Frequently Pain Interference with Day-to-Day Activities: 2.  Occasionally Vision/Perception  Vision - History Ability to See in Adequate Light: 0 Adequate Perception Perception: Within Functional Limits Praxis Praxis: WFL  Cognition Overall Cognitive Status: Within Functional Limits for tasks assessed Arousal/Alertness: Awake/alert Orientation Level: Oriented X4 Year: 2025 Attention: Focused;Sustained Focused Attention: Appears intact Sustained Attention: Appears intact Memory: Appears intact Awareness: Appears intact Problem Solving: Appears intact Problem Solving Impairment: Functional basic Sensation Sensation Light Touch: Impaired Detail Peripheral sensation comments: Rt arm and hand aching, reports no numbness/tingling Light Touch Impaired Details: Impaired RLE;Impaired LLE Hot/Cold: Appears Intact Proprioception: Impaired Detail Proprioception Impaired Details: Impaired LLE;Impaired RLE Stereognosis: Not tested Coordination Gross Motor Movements are Fluid and Coordinated: Yes Fine Motor Movements are Fluid and Coordinated: Yes Motor  Motor Motor: Abnormal tone;Paraplegia;Other (comment);Abnormal postural alignment and control Motor - Discharge Observations: Sensory deficits, muscle spasms L>R BLE  Mobility Bed Mobility Bed Mobility: Sit to Supine;Rolling Right;Rolling Left Rolling Right: Minimal Assistance - Patient > 75% Rolling Left: Minimal Assistance - Patient > 75% Supine to Sit: Moderate Assistance - Patient 50-74% Sit to Supine: Moderate Assistance - Patient 50-74% Transfers Transfers: Lateral/Scoot Transfers Sit to Stand: Dependent - Patient 0% Stand Pivot Transfer Details: Tactile cues for weight shifting;Tactile cues for posture;Manual facilitation for weight shifting;Manual facilitation for placement;Manual facilitation for weight bearing;Verbal cues for technique Lateral/Scoot Transfers: Moderate Assistance - Patient 50-74% Lateral Transfer Comment: verbal and tactile cues for forward lean, head/hips  relationship, technique, sequencing, and posture Locomotion  Gait Ambulation: No Gait Gait: No Stairs / Additional Locomotion Stairs: No Wheelchair Mobility Wheelchair Mobility: Yes Wheelchair Assistance: Independent with Scientist, Research (life Sciences): Power Wheelchair Parts Management: Needs assistance Distance: 300  Trunk/Postural Assessment  Cervical Assessment Cervical Assessment: Exceptions to [facility] Cervical AROM Overall Cervical AROM: Due to precautions Cervical Strength Overall Cervical Strength: Due to precautions Thoracic Assessment Thoracic  Assessment: Exceptions to [facility] (rounded shoulders) Lumbar Assessment Lumbar Assessment: Exceptions to [facility] (posterior pelvic tilt) Postural Control Postural Control: Deficits on evaluation Trunk Control: impaired and stays in a posterior pelvic tilt position due to B LE extensor tone  Balance Balance Balance Assessed: Yes Static Sitting Balance Static Sitting - Balance Support: Bilateral upper extremity supported Static Sitting - Level of Assistance: 5: Stand by assistance Dynamic Sitting Balance Dynamic Sitting - Balance Support: During functional activity Dynamic Sitting - Level of Assistance: 5: Stand by assistance Dynamic Sitting - Balance Activities: Reaching across midline;Reaching for objects Static Standing Balance Static Standing - Balance Support: During functional activity;Bilateral upper extremity supported Static Standing - Comment/# of Minutes: only able to tolerate standing frame Dynamic Standing Balance Dynamic Standing - Level of Assistance: Not tested (comment) Extremity Assessment  RUE Assessment RUE Assessment: Within Functional Limits Active Range of Motion (AROM) Comments: WFL General Strength Comments: overall 4/5 LUE Assessment LUE Assessment: Within Functional Limits Active Range of Motion (AROM) Comments: WFL General Strength Comments: overall 4/5 RLE Assessment RLE Assessment: Within  Functional Limits RLE Strength Right Hip Flexion: 3-/5 Right Knee Flexion: 3/5 Right Knee Extension: 3+/5 RLE Tone RLE Tone: Hypertonic;Moderate RLE Tone Comments: RLE spasms cause knee extension LLE Assessment LLE Assessment: Exceptions to [facility] LLE Strength Left Hip Flexion: 2/5 Left Knee Flexion: 2-/5 Left Knee Extension: 3-/5 LLE Tone LLE Tone: Severe;Other (comment) LLE Tone Comments: LLE spasms into knee flexion and adduction - can be severe at times   Rosita DeChalus 07/31/2023, 4:25 PM

## 2023-07-31 NOTE — Discharge Summary (Addendum)
 Physician Discharge Summary  Patient ID: Leon Rodriguez MRN: [id number] DOB/AGE: 59-Oct-1967 59 y.o.  Admit date: 07/17/2023 Discharge date: 08/01/2023  Discharge Diagnoses:  Principal Problem:   Cervical myelopathy (HCC) Active problems: Functional deficits secondary to cervical myelopathy Mood disorder Spasticity Neurogenic bladder Neurogenic bowel Seroma UTI due to Klebsiella  Discharged Condition: stable  Significant Diagnostic Studies: DG Chest 1 View Result Date: 07/22/2023 CLINICAL DATA:  58 year old male with history of fever. EXAM: CHEST  1 VIEW COMPARISON:  Chest x-ray 07/15/2023. FINDINGS: Lung volumes are low. No consolidative airspace disease. No pleural effusions. No pneumothorax. No pulmonary nodule or mass noted. Pulmonary vasculature and the cardiomediastinal silhouette are within normal limits. Orthopedic fixation hardware in the lower cervical spine incidentally noted. IMPRESSION: 1. Low lung volumes without radiographic evidence of acute cardiopulmonary disease. Electronically Signed   By: Toribio Aye M.D.   On: 07/22/2023 07:22   VAS US  LOWER EXTREMITY VENOUS (DVT) Result Date: 07/18/2023  Lower Venous DVT Study Patient Name:  Leon Rodriguez  Date of Exam:   07/18/2023 Medical Rec #: [id number]         Accession #:    [id number] Date of Birth: 59-Oct-1967         Patient Gender: M Patient Age:   58 years Exam Location:  [facility] Procedure:      VAS US  LOWER EXTREMITY VENOUS (DVT) Referring Phys: PAMELA LOVE --------------------------------------------------------------------------------  Indications: Paraplegia.  Risk Factors: Immobility. Limitations: Study limited due to continuous muscle spasms, poor positioning, poor ultrasound/tissue interface. Comparison Study: Previous exam on 06/07/2023 was negative for DVT Performing Technologist: Ezzie Potters RVT, RDMS  Examination Guidelines: A complete evaluation includes B-mode imaging, spectral Doppler, color  Doppler, and power Doppler as needed of all accessible portions of each vessel. Bilateral testing is considered an integral part of a complete examination. Limited examinations for reoccurring indications may be performed as noted. The reflux portion of the exam is performed with the patient in reverse Trendelenburg.  +--------+---------------+---------+-----------+----------+--------------------+ RIGHT   CompressibilityPhasicitySpontaneityPropertiesThrombus Aging       +--------+---------------+---------+-----------+----------+--------------------+ CFV     Full           Yes      Yes                                       +--------+---------------+---------+-----------+----------+--------------------+ SFJ     Full                                                              +--------+---------------+---------+-----------+----------+--------------------+ FV Prox Full           Yes      Yes                                       +--------+---------------+---------+-----------+----------+--------------------+ FV Mid  Full           Yes      Yes                                       +--------+---------------+---------+-----------+----------+--------------------+  FV      Full           Yes      Yes                                       Distal                                                                    +--------+---------------+---------+-----------+----------+--------------------+ PFV                    Yes      Yes                  patent by                                                                 color/doppler        +--------+---------------+---------+-----------+----------+--------------------+ POP     Full           Yes      Yes                                       +--------+---------------+---------+-----------+----------+--------------------+ PTV     Full                                                               +--------+---------------+---------+-----------+----------+--------------------+ PERO    Full                                                              +--------+---------------+---------+-----------+----------+--------------------+   +---------+---------------+---------+-----------+----------+-------------------+ LEFT     CompressibilityPhasicitySpontaneityPropertiesThrombus Aging      +---------+---------------+---------+-----------+----------+-------------------+ CFV      Full           Yes      Yes                                      +---------+---------------+---------+-----------+----------+-------------------+ SFJ      Full                                                             +---------+---------------+---------+-----------+----------+-------------------+ FV Prox  Full  Yes      Yes                                      +---------+---------------+---------+-----------+----------+-------------------+ FV Mid   Full           Yes      Yes                  limited compression +---------+---------------+---------+-----------+----------+-------------------+ FV DistalFull           Yes      Yes                  limited compression +---------+---------------+---------+-----------+----------+-------------------+ PFV      Full                                                             +---------+---------------+---------+-----------+----------+-------------------+ POP      Full           Yes      Yes                                      +---------+---------------+---------+-----------+----------+-------------------+ PTV      Full                                                             +---------+---------------+---------+-----------+----------+-------------------+ PERO     Full                                                             +---------+---------------+---------+-----------+----------+-------------------+      Summary: BILATERAL: - No evidence of deep vein thrombosis seen in the lower extremities, bilaterally. -No evidence of popliteal cyst, bilaterally.   *See table(s) above for measurements and observations. Electronically signed by Lonni Gaskins MD on 07/18/2023 at 1:24:05 PM.    Final      Labs:  Basic Metabolic Panel: Recent Labs  Lab 07/29/23 0558 08/01/23 0558  NA 137 137  K 3.9 4.3  CL 105 101  CO2 25 25  GLUCOSE 96 92  BUN 15 18  CREATININE 0.91 0.79  CALCIUM 9.4 9.7    CBC: Recent Labs  Lab 07/27/23 1634 07/29/23 0558 08/01/23 0558  WBC 6.3 6.7 6.3  NEUTROABS 3.4  --   --   HGB 11.0* 10.4* 11.2*  HCT 32.6* 29.4* 32.3*  MCV 97.0 96.1 94.4  PLT 450* 431* 445*   Brief HPI:   Leon Rodriguez is a 58 y.o. male with history of hip pain with spasms progressing to BLE weakness and 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 He was originally admitted on 06/05/23 for ACDF C6-T1 by Dr. Carollee and had worsening of LE weakness with neurogenic bowel and bladder. He was admitted to [facility] on  12/13 for intensive rehab program which was limited by ongoing issues with pain and spasticity. He developed sepsis due to Kleb aerogenous bacteremia secondary to UTI and required transfer to acute from 12/18-12/24 for management. He was readmitted to [facility] on 06/18/23 and continued to have severe spasticity with pain limiting activity and attempts at rehab. He had side effects to multiple medications as well as issues with confusion with due to higher doses of narcotics.    Repeat MRI C spine showed severe canal stenosis at C7-T1 and Dr. Carollee recommended posterior cervical laminectomy C6  to T1 in hopes of improving spasticity.   He underwent bilateral C6, C7 and T1 laminectomies with posterior cervical decompression w/fusion C6/C7 and C7/T1 on 07/12/23. Post op PT and OT consulted to resume therapy. Foley has been in place due to neurogenic bladder.  Foley removed this evening at [facility]. Pt reports he feels  much better overall since his surgery. Patient is currently requiring mod to max +2 assist for bed mobility and to sit at EOB for 5 minutes.    Hospital Course: Leon Rodriguez was admitted to rehab 07/17/2023 for inpatient therapies to consist of PT, ST and OT at least three hours five days a week. Past admission physiatrist, therapy team and rehab RN have worked together to provide customized collaborative inpatient rehab. Foley removed and bowel and bladder programs re-initiated. Spasticity continues and spasms still a major issue. Baclofen  dosing increased. C-collar for comfort. BLE venous duplex negative for DVT. Seroquel  dosing adjusted. Dantrolene  added back on 1/27 and provided PRN baclofen . Constipation aggressively addressed. Febrile to 102.5 on 1/27.  Began vancomycin  and Zosyn  empirically while source of infection sought. Chest x-ray clear. Urine likely source. Culture positive for Klebsiella. Adjusted antibiotics to Maxipime . Increased Dantrolene  to 25 mg TID. Seroma noted at incision and monitored. Seroquel  for sleep increased to 50 mg q HS on 1/30. Intermittent catheterization five times daily continued.  Still a difficult balance between spasticity and tolerance of medications. Tizanidine  change to 2 mg TID on 2/01. Increased to 4 mg dosing on 2/02. Adjusted antibiotics to Bactrim  for 7 days on 2/03. Receiving oral laxatives and Enemeez after supper>>inconsistent results. Seen in follow-up by Dr. Carollee on 2/04. Pain remains out of good control. MS Contin  q HS resumed for discharge home.   Blood pressures were monitored on TID basis and remained stable.  Rehab course: During patient's stay in rehab weekly team conferences were held to monitor patient's progress, set goals and discuss barriers to discharge. At admission, patient required  total with basic self-care skills and with mobility.   He has had improvement in activity tolerance, balance, postural control as well as ability to  compensate for deficits. He has had improvement in functional use RUE/LUE  and RLE/LLE as well as improvement in awareness.   Discharge disposition: 06-Home-Health Care Svc     Diet: Regular  Special Instructions: No driving, alcohol consumption or tobacco use.  Plan to arrange for Dr Emeline to see for phenol and Botox  after d/c and to follow-up with Dr. Lovorn.  Discharge Instructions     Ambulatory referral to Physical Medicine Rehab   Complete by: As directed    Hospital follow-up   Discharge patient   Complete by: As directed    Discharge disposition: 06-Home-Health Care Svc   Discharge patient date: 08/01/2023      Allergies as of 08/01/2023   No Known Allergies      Medication List  STOP taking these medications    alum & mag hydroxide-simeth 200-200-20 MG/5ML suspension Commonly known as: MAALOX/MYLANTA   polyethylene glycol 17 g packet Commonly known as: MIRALAX  / GLYCOLAX  Replaced by: polyethylene glycol powder 17 GM/SCOOP powder   Zantac 360 10 MG tablet Generic drug: famotidine        TAKE these medications    acetaminophen  325 MG tablet Commonly known as: TYLENOL  Take 2 tablets (650 mg total) by mouth every 4 (four) hours as needed for fever or mild pain (pain score 1-3). What changed:  when to take this reasons to take this   apixaban  2.5 MG Tabs tablet Commonly known as: Eliquis  Take 1 tablet (2.5 mg total) by mouth 2 (two) times daily.   ascorbic acid  1000 MG tablet Commonly known as: VITAMIN C  Take 1 tablet (1,000 mg total) by mouth daily with lunch.   baclofen  10 MG tablet Commonly known as: LIORESAL  Take 30 mg (3 tablets) by mouth every 6 (six) hours and may also take 10 mg (1 tablet) 4 (four) times daily as needed for muscle spasms (for out of control spasticity/spasms- that Zanaflex  doesn't improve). What changed:  medication strength how much to take when to take this reasons to take this   dantrolene  25 MG capsule Commonly  known as: DANTRIUM  Take 1 capsule (25 mg total) by mouth 3 (three) times daily.   diazepam  5 MG tablet Commonly known as: VALIUM  Take 1 tablet (5 mg total) by mouth every 6 (six) hours.   docusate sodium  283 MG enema Commonly known as: ENEMEEZ Place 1 enema (283 mg total) rectally daily as needed for severe constipation.   DULoxetine  30 MG capsule Commonly known as: CYMBALTA  Take 1 capsule (30 mg total) by mouth daily.   guaiFENesin  600 MG 12 hr tablet Commonly known as: MUCINEX  Take 1 tablet (600 mg total) by mouth 2 (two) times daily.   hydrocerin Crea Apply 1 Application topically 2 (two) times daily.   leptospermum manuka honey Pste paste Apply 1 Application topically daily.   lidocaine  2 % jelly Commonly known as: XYLOCAINE  Apply topically as needed (Use with in and out catheter). What changed:  how much to take how to take this when to take this reasons to take this   lidocaine  5 % Commonly known as: LIDODERM  Place 1 patch onto the skin daily. Remove & Discard patch within 12 hours or as directed by MD   morphine  15 MG 12 hr tablet Commonly known as: MS CONTIN  Take 1 tablet (15 mg total) by mouth at bedtime. What changed: when to take this   oxyCODONE  5 MG immediate release tablet Commonly known as: Oxy IR/ROXICODONE  Take 1 tablet (5 mg total) by mouth every 4 (four) hours as needed for breakthrough pain.   polyethylene glycol powder 17 GM/SCOOP powder Commonly known as: GLYCOLAX /MIRALAX  Mix as directed and take 1 capful (17 g) by mouth daily. Replaces: polyethylene glycol 17 g packet   QUEtiapine  50 MG tablet Commonly known as: SEROQUEL  Take 1 tablet (50 mg total) by mouth at bedtime.   senna-docusate 8.6-50 MG tablet Commonly known as: Senokot-S Take 3 tablets by mouth daily with breakfast. What changed:  how much to take when to take this   simethicone  80 MG chewable tablet Commonly known as: MYLICON Chew 1 tablet (80 mg total) by mouth 4  (four) times daily.   sulfamethoxazole -trimethoprim  800-160 MG tablet Commonly known as: BACTRIM  DS Take 1 tablet by mouth every 12 (twelve) hours for 4 days.  tamsulosin  0.4 MG Caps capsule Commonly known as: FLOMAX  Take 1 capsule (0.4 mg total) by mouth daily after supper.   tiZANidine  2 MG tablet Commonly known as: ZANAFLEX  Take 1 tablet (2 mg total) by mouth 3 (three) times daily. What changed:  medication strength how much to take when to take this reasons to take this        Follow-up Information     [name], MD Follow up.   Specialty: Internal Medicine Why: Call the office in 1 to 2 days to make arrangements for hospital follow-up appointment. Contact information: [address] [place] [address] [phone]         Cornelio Bouchard, MD. Go to.   Specialty: Physical Medicine and Rehabilitation Contact information: [address] [place] [address] [phone]         Dawley, Lani C, DO Follow up.   Why: Call the office in 1 to 2 days to make arrangements for hospital follow-up appointment. Contact information: [address] [place] [address] [phone]                 Signed: Nena JINNY Buba, PA-C 08/01/2023, 11:04 AM

## 2023-07-31 NOTE — Progress Notes (Signed)
 Physical Therapy Session Note  Patient Details  Name: Leon Rodriguez MRN: [id number] Date of Birth: [birth year]  Today's Date: 07/31/2023 PT Individual Time: 1135-1200 and 1440-1535 PT Individual Time Calculation (min): 25 min and 55 min  Short Term Goals: Week 2:  PT Short Term Goal 1 (Week 2): STG=LTG d/t ELOS  Skilled Therapeutic Interventions/Progress Updates: Tx1: Pt presented in PWC agreeable to therapy. Pt states mild unrated pain no intervention requested at this time. Pt noted that PWC is not holding charge. PTA therefore initiated d/c questions and performed MMT in room. Pt then navigated PWC to aline with bed. Pt required total A for Slide board set up but was able to complete transfer with modA (+2 present for safety). Pt noted to not have any spasms until transfer in progress. Pt required modA for sit to supine transfer and noted spasm of LLE requiring assist from PTA to improve alignment and apply bolster to minimize crossing of legs. Pt repositioned to comfort and left in bed with call bell within reach and needs met.   Tx2: Pt presented in bed agreeable to therapy with brother present. Pt indicating only recently received anti spasticity meds, pt noted to have LLE contracted in extension. PTA obtained hot pack and placed at pt's hamstrings for spasticity management. PTA stabilizing and stretching LLE then providing overpressure at knee to facilitate stretch. PTA discussed with brother methods to help with stretching (per brother pt has ankle weights which could be used for overpressure). Pt was able to demonstrate near full extension after stretching and hot pack. Pt agreeable to transfer to [facility]. Pt performed supine to sit with minA and use of bed features. Brother agreeable to complete transfer for practice. Brother was able to place Slide board appropriately and completed transfer with what appeared to be maxA (PTA holding gait belt for safety). Pt was able to use bed features to  reposition in PWC. Pt then navigated in unit practicing in/out elevator (without hitting walls), and navigated to ortho gym to ascend/descend ramp including 180 degree turn. Pt navigated back to room in same manner as prior and nsg requesting pt transfer back to bed for I/O cath. PTA and pt performed Slide board transfer back to bed with modA and PTA demonstrating Bobath technique to facilitate anterior lean. Pt required modA sit to supine and with increased time/effort and use of bed features pt able to scoot anteriorly to [facility] with supervision. Pt repositioned to comfort and left in bed with call bell within reach and current needs met.      Therapy Documentation Precautions:  Precautions Precautions: Fall, Cervical Precaution Comments: severe muscle spasms, hard collar for comfort Required Braces or Orthoses: Cervical Brace Cervical Brace: Hard collar Restrictions Weight Bearing Restrictions Per Provider Order: No Other Position/Activity Restrictions: per Camie Pickle PA, Pt can wear cervical brace PRN as on 1/20 General:   Vital Signs: Therapy Vitals Temp: 97.9 F (36.6 C) Pulse Rate: (!) 103 Resp: 18 BP: 132/84 Patient Position (if appropriate): Lying Oxygen Therapy SpO2: 100 % O2 Device: Room Air Pain: Pain Assessment Faces Pain Scale: Hurts a little bit Pain Location: Hip Mobility:   Locomotion : Gait Gait: No Stairs / Additional Locomotion Stairs: No Wheelchair Mobility Wheelchair Mobility: Yes Wheelchair Assistance: Independent with Scientist, Research (life Sciences): Power Wheelchair Parts Management: Needs assistance  Trunk/Postural Assessment : Cervical Assessment Cervical Assessment: Exceptions to [facility] Cervical AROM Overall Cervical AROM: Due to precautions  Balance: Balance Balance Assessed: Yes Static Sitting Balance Static  Sitting - Balance Support: Bilateral upper extremity supported Static Sitting - Level of Assistance: 5: Stand by  assistance Dynamic Sitting Balance Dynamic Sitting - Balance Support: During functional activity Dynamic Sitting - Level of Assistance: 5: Stand by assistance Dynamic Sitting - Balance Activities: Reaching across midline;Reaching for objects Static Standing Balance Static Standing - Balance Support: During functional activity;Bilateral upper extremity supported Exercises:   Other Treatments:      Therapy/Group: Individual Therapy  Verlean Allport 07/31/2023, 1:38 PM

## 2023-07-31 NOTE — Telephone Encounter (Signed)
 Pharmacy Patient Advocate Encounter  Received notification from [facility] that Prior Authorization for Dantrolene  Sodium 25MG  capsules has been APPROVED from 07/31/2023 to 07/30/2024   PA #/Case ID/Reference #: [id number] Key: [id number]

## 2023-07-31 NOTE — Progress Notes (Signed)
 Occupational Therapy Discharge Summary  Patient Details  Name: Leon Rodriguez MRN: [id number] Date of Birth: 01-14-1966  Date of Discharge from OT service:July 31, 2023  Patient has met 5 of 5 long term goals due to  completion of fam education and SCI education . Pt's functional gains have been limited 2/2 ongoing BLE/trunk spasms but he has made steady progress with sitting balance and TB transfers. Dynamic sitting balance with supervision in addition to UB dressing with pull over shirt. Pt is independent with directing care and toileting steps at bed level. Pt completes bathing with mod A at bed level and/or shower level. Pt verbalizes pressure relief schedule independently. Pt's SO has been active in pt's care and provides the appropriate level of care.  Patient to discharge at overall  mod A to total A  level.  Patient's care partner is independent to provide the necessary physical assistance at discharge.    Reasons goals not met: n/a  Recommendation:  Patient will benefit from ongoing skilled OT services in home health setting to continue to advance functional skills in the area of iADL and Reduce care partner burden.  Equipment: Hospital bed, DABSC, TTB  Reasons for discharge: treatment goals met and discharge from hospital  Patient/family agrees with progress made and goals achieved: Yes  OT Discharge ADL ADL Eating: Set up Where Assessed-Eating: Bed level Grooming: Supervision/safety, Setup Where Assessed-Grooming: Wheelchair Upper Body Bathing: Supervision/safety Where Assessed-Upper Body Bathing: Shower Lower Body Bathing: Maximal assistance Where Assessed-Lower Body Bathing: Shower Upper Body Dressing: Supervision/safety Where Assessed-Upper Body Dressing: Wheelchair Lower Body Dressing: Dependent Where Assessed-Lower Body Dressing: Bed level Toileting: Dependent Where Assessed-Toileting: Bed level Toilet Transfer: Maximal assistance Toilet Transfer Method:  Scientist, Research (life Sciences): Drop arm bedside commode Tub/Shower Transfer: Not assessed Film/video Editor: Dependent Film/video Editor Method: Other (comment) (rolling shower chair) Astronomer: Other (comment) (rolling shower chair) Vision Baseline Vision/History: 0 No visual deficits Patient Visual Report: No change from baseline Vision Assessment?: No apparent visual deficits Perception  Perception: Within Functional Limits Praxis   Cognition Cognition Overall Cognitive Status: Within Functional Limits for tasks assessed Orientation Level: Person;Place;Situation Person: Oriented Place: Oriented Situation: Oriented Memory: Appears intact Attention: Focused;Sustained Focused Attention: Appears intact Sustained Attention: Appears intact Awareness: Appears intact Problem Solving: Appears intact Problem Solving Impairment: Functional basic Safety/Judgment: Appears intact Brief Interview for Mental Status (BIMS) Repetition of Three Words (First Attempt): 3 Temporal Orientation: Year: Correct Temporal Orientation: Month: Accurate within 5 days Temporal Orientation: Day: Correct Recall: Sock: Yes, no cue required Recall: Blue: Yes, no cue required Recall: Bed: Yes, no cue required BIMS Summary Score: 15 Sensation Sensation Light Touch: Impaired Detail Peripheral sensation comments: Rt arm and hand aching, reports no numbness/tingling Light Touch Impaired Details: Impaired RLE;Impaired LLE Hot/Cold: Appears Intact Proprioception: Impaired Detail Proprioception Impaired Details: Impaired LLE;Impaired RLE Stereognosis: Not tested Coordination Gross Motor Movements are Fluid and Coordinated: Yes Fine Motor Movements are Fluid and Coordinated: Yes Motor  Motor Motor: Abnormal tone;Paraplegia;Other (comment);Abnormal postural alignment and control Trunk/Postural Assessment  Cervical Assessment Cervical Assessment: Exceptions to  [facility] (cervical precautions, forward head) Cervical AROM Overall Cervical AROM: Due to precautions Thoracic Assessment Thoracic Assessment: Exceptions to [facility] (rounded shoulders) Lumbar Assessment Lumbar Assessment: Exceptions to [facility] (posterior pelvic tilt) Postural Control Trunk Control: impaired and stays in a posterior pelvic tilt position due to B LE extensor tone  Balance Static Sitting Balance Static Sitting - Balance Support: Bilateral upper extremity supported Static Sitting - Level  of Assistance: 5: Stand by assistance Dynamic Sitting Balance Dynamic Sitting - Balance Support: During functional activity Dynamic Sitting - Level of Assistance: 5: Stand by assistance Extremity/Trunk Assessment RUE Assessment RUE Assessment: Within Functional Limits Active Range of Motion (AROM) Comments: WFL General Strength Comments: overall 4/5 LUE Assessment LUE Assessment: Within Functional Limits Active Range of Motion (AROM) Comments: WFL General Strength Comments: overall 4/5   Maritza Debby Mare 07/31/2023, 7:14 AM

## 2023-08-01 ENCOUNTER — Other Ambulatory Visit (HOSPITAL_COMMUNITY): Payer: Self-pay

## 2023-08-01 LAB — CBC
HCT: 32.3 % — ABNORMAL LOW (ref 39.0–52.0)
Hemoglobin: 11.2 g/dL — ABNORMAL LOW (ref 13.0–17.0)
MCH: 32.7 pg (ref 26.0–34.0)
MCHC: 34.7 g/dL (ref 30.0–36.0)
MCV: 94.4 fL (ref 80.0–100.0)
Platelets: 445 10*3/uL — ABNORMAL HIGH (ref 150–400)
RBC: 3.42 MIL/uL — ABNORMAL LOW (ref 4.22–5.81)
RDW: 13.4 % (ref 11.5–15.5)
WBC: 6.3 10*3/uL (ref 4.0–10.5)
nRBC: 0 % (ref 0.0–0.2)

## 2023-08-01 LAB — BASIC METABOLIC PANEL
Anion gap: 11 (ref 5–15)
BUN: 18 mg/dL (ref 6–20)
CO2: 25 mmol/L (ref 22–32)
Calcium: 9.7 mg/dL (ref 8.9–10.3)
Chloride: 101 mmol/L (ref 98–111)
Creatinine, Ser: 0.79 mg/dL (ref 0.61–1.24)
GFR, Estimated: >60 mL/min (ref >60)
Glucose, Bld: 92 mg/dL (ref 70–99)
Potassium: 4.3 mmol/L (ref 3.5–5.1)
Sodium: 137 mmol/L (ref 135–145)

## 2023-08-01 MED ORDER — MORPHINE SULFATE ER 15 MG PO TBCR: 15.0000 mg | EXTENDED_RELEASE_TABLET | Freq: Every day | ORAL | Status: DC

## 2023-08-01 MED ORDER — MORPHINE SULFATE ER 15 MG PO TBCR
15.0000 mg | EXTENDED_RELEASE_TABLET | Freq: Every day | ORAL | 0 refills | Status: DC
  Filled 2023-08-01: qty 3, 3d supply, fill #0

## 2023-08-01 MED ORDER — APIXABAN 2.5 MG PO TABS
2.5000 mg | ORAL_TABLET | Freq: Two times a day (BID) | ORAL | 0 refills | Status: DC
  Filled 2023-08-01: qty 60, 30d supply, fill #0

## 2023-08-01 MED ORDER — GUAIFENESIN ER 600 MG PO TB12
600.0000 mg | ORAL_TABLET | Freq: Two times a day (BID) | ORAL | 0 refills | Status: DC
  Filled 2023-08-01: qty 14, 7d supply, fill #0

## 2023-08-01 MED ORDER — LIDOCAINE HCL URETHRAL/MUCOSAL 2 % EX GEL
CUTANEOUS | 0 refills | Status: AC | PRN
  Filled 2023-08-01: qty 30, 30d supply, fill #0

## 2023-08-01 MED ORDER — QUETIAPINE FUMARATE 50 MG PO TABS
50.0000 mg | ORAL_TABLET | Freq: Every day | ORAL | 0 refills | Status: DC
  Filled 2023-08-01 (×2): qty 30, 30d supply, fill #0

## 2023-08-01 NOTE — Progress Notes (Signed)
 Inpatient Rehabilitation Discharge Medication Review by a Pharmacist  A complete drug regimen review was completed for this patient to identify any potential clinically significant medication issues.  High Risk Drug Classes Is patient taking? Indication by Medication  Antipsychotic Yes Quetiapine  - sleep, mood stabilization  Anticoagulant No   Antibiotic Yes Septra  DS for 4 more days - UTI  Opioid Yes MS Contin  daily at bedtime, PRN Oxycodone  - pain  Antiplatelet No   Hypoglycemics/insulin No   Vasoactive Medication Yes Tamsulosin  - neurogenic bladder  Chemotherapy No   Other Yes Baclofen , diazepam , dantrolene , tizanidine  - muscle relaxers, spasticity Duloxetine  - neuropathic pain, mood stabilization Lidocaine  patch - topical pain relief Vitamin C  - supplement Miralax , senna-docusate - laxatives Simethicone  - gas retention Medihoney (L heel) - wound healing  PRNs: Acetaminophen  - mild pain or fever Guaifenesin  - mucolytic Lidocaine  jelly - pain relief with I&O caths Docusate enema - severe constipation     Type of Medication Issue Identified Description of Issue Recommendation(s)  Drug Interaction(s) (clinically significant)     Duplicate Therapy     Allergy     No Medication Administration End Date     Incorrect Dose     Additional Drug Therapy Needed     Significant med changes from prior encounter (inform family/care partners about these prior to discharge). MS Contin  with supper stopped 2/5 but resuming 2/6 pm with bedtime dosing.  On lower doses of tamsulosin , duloxetine , quetiapine , simethicone . Tizanidine  changed from PRN to scheduled.  Guaifenesin  changed from scheduled to PRN.  Dantrolene  resumed 07/21/23 New Vitamin C , Septra  DS. Communicate changes with patient/family prior to discharge.         Other        Clinically significant medication issues were identified that warrant physician communication and completion of prescribed/recommended  actions by midnight of the next day:  No  Pharmacist comments:   - Several meds had not been continued after inpatient transfer 07/12/23 for surgery > resumed at lower doses during CIR admit. - Septra  DS was planned for 7 days, but Rx to [facility] for 4 more days = 10 days total  Time spent performing this drug regimen review (minutes):  25   Leon Rodriguez, [place] 08/01/2023 7:59 AM

## 2023-08-01 NOTE — Progress Notes (Signed)
 Patient ID: Leon Rodriguez, male   DOB: August 29, 1965, 58 y.o.   MRN: [id number]   [id number] scheduled ambulance transport with insurance [employer] [phone]). Reports there will be follow-up once scheduled.   *SW received call from [facility] that transportation was arranged with [facility] and pick up is scheduled for pick up between 10:30am-11am.    SW ordered transfer board with [employer] [facility] via parachute.   Transportation cancelled due to medication delays for patient.   1134- SW called [employer] to request return pick up. Agent reported that transportation has been scheduled and waiting on arrival. No time provided. SW shared with medical team.   SW faxed and emailed closed system catheter 43fr order to [facility] [employer] (f:[phone]).  SW faxed [employer] application [phone]).  SW faxed incontinence supplies to [employer] ([phone]/[phone]).   Graeme Jude, MSW, LCSW Office: [phone] Cell: [phone] Fax: [phone]

## 2023-08-01 NOTE — Progress Notes (Signed)
 PROGRESS NOTE   Subjective/Complaints:  Pt reports pain skyrocketed last night- didn't ask for oxy until 6am for some reason- and hadn't had Oxy since 4 days ago prior-   Spasms out of control per pt- he thought he took Oxy x2 at least last night- didn't. Stil esp messing with his toes.   ROS:    Pt denies SOB, abd pain, CP, N/V/C/D, and vision changes Pain worse and spasms worse    Objective:   No results found.   Recent Labs    08/01/23 0558  WBC 6.3  HGB 11.2*  HCT 32.3*  PLT 445*    Recent Labs    08/01/23 0558  NA 137  K 4.3  CL 101  CO2 25  GLUCOSE 92  BUN 18  CREATININE 0.79  CALCIUM 9.7      Intake/Output Summary (Last 24 hours) at 08/01/2023 9185 Last data filed at 08/01/2023 0510 Gross per 24 hour  Intake --  Output 2850 ml  Net -2850 ml     Pressure Injury 07/17/23 Heel Left Stage 2 -  Partial thickness loss of dermis presenting as a shallow open injury with a red, pink wound bed without slough. blister that has ruptured with drainage (Active)  07/17/23 (as per nurse Charmaine) 1700  Location: Heel  Location Orientation: Left  Staging: Stage 2 -  Partial thickness loss of dermis presenting as a shallow open injury with a red, pink wound bed without slough.  Wound Description (Comments): blister that has ruptured with drainage  Present on Admission: Yes     Physical Exam: Vital Signs Blood pressure 109/73, pulse (!) 101, temperature 98.2 F (36.8 C), temperature source Oral, resp. rate 16, height 5' 9 (1.753 m), weight 89.9 kg, SpO2 98%.      General: awake, alert, appropriate, much more awake and interactive; fiance taking shower; NAD HENT: conjugate gaze; oropharynx moist CV: regular rhythm, borderline tachycardic rate; no JVD Pulmonary: CTA B/L; no W/R/R- good air movement GI: soft, NT, ND, (+)BS Psychiatric: appropriate- more interactive; flat Neurological:  Ox3 Spasticity/spasms MUCH worse this AM- more frequent body spasms Skin: improved swelling around surgical site. Left heel wound pink with central opening, healing Neurological: more alert after being slow initially.  Mentation appropriate. Speech clear.  MOTOR: RUE: 5/5 Deltoid, 5/5 Biceps, 5/5 Triceps,5/5 Grip LUE: 5/5 Deltoid, 5/5 Biceps, 5/5 Triceps, 5/5 Grip RLE: HF 2+/5, KE 3/5, ADF 4/5, APF 4/5 LLE: HF 2/5, KE 2/5, ADF 2/5, APF 2/5- no significant changes today 2/3 Severe hypertonia b/l LE left greater than right, 3-4/4--triggered by simple movement of lower limbs. Increased left hip adductor tone   SENSORY: Altered sensation below T4 level -    Assessment/Plan: 1. Functional deficits which require 3+ hours per day of interdisciplinary therapy in a comprehensive inpatient rehab setting. Physiatrist is providing close team supervision and 24 hour management of active medical problems listed below. Physiatrist and rehab team continue to assess barriers to discharge/monitor patient progress toward functional and medical goals  Care Tool:  Bathing    Body parts bathed by patient: Right arm, Left arm, Chest, Abdomen, Front perineal area   Body parts bathed by helper: Buttocks,  Right upper leg, Right lower leg, Left lower leg, Left upper leg Body parts n/a: Buttocks, Left upper leg, Right upper leg, Right lower leg, Left lower leg   Bathing assist Assist Level: Moderate Assistance - Patient 50 - 74%     Upper Body Dressing/Undressing Upper body dressing   What is the patient wearing?: Pull over shirt    Upper body assist Assist Level: Supervision/Verbal cueing    Lower Body Dressing/Undressing Lower body dressing      What is the patient wearing?: Pants, Incontinence brief     Lower body assist Assist for lower body dressing: Dependent - Patient 0%     Toileting Toileting    Toileting assist Assist for toileting: Dependent - Patient 0%     Transfers Chair/bed  transfer  Transfers assist  Chair/bed transfer activity did not occur: Safety/medical concerns  Chair/bed transfer assist level: Moderate Assistance - Patient 50 - 74% (+2 present for safety)     Locomotion Ambulation   Ambulation assist   Ambulation activity did not occur: Safety/medical concerns          Walk 10 feet activity   Assist  Walk 10 feet activity did not occur: Safety/medical concerns        Walk 50 feet activity   Assist Walk 50 feet with 2 turns activity did not occur: Safety/medical concerns         Walk 150 feet activity   Assist Walk 150 feet activity did not occur: Safety/medical concerns         Walk 10 feet on uneven surface  activity   Assist Walk 10 feet on uneven surfaces activity did not occur: Safety/medical concerns         Wheelchair     Assist Is the patient using a wheelchair?: Yes Type of Wheelchair: Power Wheelchair activity did not occur: Safety/medical concerns (w/c malfunction)  Wheelchair assist level: Set up assist Max wheelchair distance: 300    Wheelchair 50 feet with 2 turns activity    Assist    Wheelchair 50 feet with 2 turns activity did not occur: Safety/medical concerns   Assist Level: Independent   Wheelchair 150 feet activity     Assist  Wheelchair 150 feet activity did not occur: Safety/medical concerns   Assist Level: Independent   Blood pressure 109/73, pulse (!) 101, temperature 98.2 F (36.8 C), temperature source Oral, resp. rate 16, height 5' 9 (1.753 m), weight 89.9 kg, SpO2 98%.  Medical Problem List and Plan: 1. Functional deficits secondary to Cervical myelopathy s/p ACDF and PCDF C6-T1 1/17 by Dr. Carollee             -patient may shower, please cover incision             -ELOS/Goals: 2/6, Sup to min A            -d/c today by ambulance at 10:30-11am  -send home with MS Contin  and Oxy 30 pills- just in case.  2.  Antithrombotics: -DVT/anticoagulation:   Pharmaceutical: Lovenox              -antiplatelet therapy: N/A 3. Pain Management:  Tylenol  650 mg qid. Continue MS contin   15mg  w/supper and oxycodone  5mg  prn  1/23- pain mainly from spasms, but also now has new neck pain from posterior fusion- con't regimen  1/26- main spasms from spasticity- neck pain minor per pt  2/5- will stop MS Contin - let him go home with Oxy as needed for neck pain- most  of pain from spasticity, but long acting pain meds are not needed for this type of pain  2/6- restarted MS Contin  at night since pain out of control last night- only took Oxy x1- but didn't sleep- will restart MS Contin - and send home with some Oxy- #30 and MS Contin -  4. Mood/Behavior/Sleep: LCSW to follow for evaluation and support.              --was on Seroquel  for sleep- restart              -Restart cymbalta  at 30mg  daily             -antipsychotic agents: Seroquel  for confusion- now increased to 50mg .   -he has been more alert this am   5. Neuropsych/cognition: This patient is intermittently  capable of making decisions on his own behalf. 6. Skin/Wound Care: Routine pressure relief measures.              --neck incision cdi, seroma decreasing in size  7. Fluids/Electrolytes/Nutrition: Monitor I/O. Check CMET in am             -Continue vitamin C , prosource 8. Spasticity: Continue Valium  5 mg qid, baclofen  20 mg qid, Zanaflex  4 mg every 6 hours prn.    1/23- spasms still a major issue- will monitor for other options. Baclofen  has been decreased from prior dose of 30 mg q6 hours- will see if need to increase again. 1/24- d/w pt/fiance- will increase Baclofen  to 30 mg q6 hours.  - Baclofen  10 mg QID prn has been added for severe spasms not controlled with other regimen 1/29 increased dantrium  to 25mg  bid. Titrate as tolerated 2/2 change tizanidine  to 4mg  TID 2/3 increase dantrium  to 25mg  tid as arousal is good.  -for now can utilize tone to help with standing and some mobility tasks 2/4- will  arrange for Dr Emeline to see for phenol and Botox  after d/c and to see me. 2/5- decreased Zanaflex  to 2 mg TID since more sedating 2/6- spasms worse this AM- hard to tell if due to reduction in Zanaflex  vs d/c of MS Contin - restarted MS Contin  since pain much worse per pt. Will wait on increasing Zanaflex  back to 4 mg TID, but if calls office, can re-increase.   9. Neurogenic bladder: Cath every 4 hours to keep bladder volumes < 350 cc. Continue flomax  0.4mg              --1/23-1/25con't cathing, no matter cath volumes 5x/day  2/3 cath volumes 250-350cc  2/5- changed back to q4 hours cathing 10. Neurogenic bowel: Was not on bowel program. Last BM documented on 01/18 --Will change senna to am, continue miralax  in am.  --Schedule mini enema at nights.  -Sorbitol  scheduled tomorrow    1/23- hasn't had good BM's on acute, so will do sorbitol  today.   1/24- had good BM last night with bowel program  1/25- didn't have any results last night- will give sorbitol  to get results tonight and increase Senna to 3 tabs daily- already on Miralax  daily. 1/26- NO BM with bowel program again last night- will give Sorbitol  60cc and do mini enema tonight if no BM today. Constipation is likely why spasticity is worse.    1/27 had large bm yesterday morning  1/31 had large bm yesterday at 15:45 but before admin of sorbitol /bowel program   -receiving oral meds in am and enemeez after supper without consistent results.   2/3 still without consistent results. Had bm  this morning at 0800   -will give sorbitol  this afternoon followed by [employer] [facility] at 2000  2/4-2/6 Bowels working well per wife 11. Fever:  -now afebrile, wbc's improvd  1/30 Cx + for 60k Klebsiella-  Completed cefepime   -continue bactrim  tomorrow for 7 days -monitor clinically      12.  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 Mr. Bottomley will also use these features has he self-caths and to manage spasticity. A manual wheelchair is not appropriate for Mr. Haman because his fluctuating spasticity and tone force him to slide out of the chair and caused pain due to lack of adjustability. In a chair without the above-mentioned features, Mr. Navarrete was only able to tolerate 1-2 hours without pain. Mr. Siler requires elevating leg rests to promote appropriate positioning for self-cathing and reduce edema. Mr. Dzikowski requires the Multiple Seat Function Control Kit to allow independent control their of the prescribed power seat functions from the drive control and/or seat function interface. It includes a function selection switch that allows him to select the seat function required and an indicator feature for visual feedback of the selection. Mr. Beaver requires a hybrid air cushion for pressure relief and ease of transfers using a slideboard. Mr. jlen wintle the appropriate cognitive abilities to use the above-described equipment appropriately.   Patient requires incontinence supplies due to bowel and bladder incontinence- and bowel program from neurogenic bowel and bladder due to incomplete quadriplegia- he needs chucks, gloves, lube, etc to get him cleaned up at least 1-3x/day- he also needs gloves for cathing. This is a permanent condition- and will not improve to the point he won't need these supplies- will need for >1 year.   Patient requires in/out caths- catheters- due to urinary retention from neurogenic bladder-from his incomplete quadriplegia he has  a permanent condition, so will will require catheters 16 french coude' 6x/day- so 180 per month- he is cathed by his fiance', and is unable to cath himself.  He will need catheters for at least 1 year.    I spent a total of 38   minutes on total care today- >50% coordination of care- due to d/w pt and fiance' about d/c- pain meds; and plan for changing pain meds again.    The patient is medically ready for discharge to home and will need follow-up with [facility] PM&R in the next month. In addition, they will need to follow up with their PCP, Neurosurgery- Dr Dawley.      LOS: 15 days A FACE TO FACE EVALUATION WAS PERFORMED  Tait Balistreri 08/01/2023, 8:14 AM

## 2023-08-01 NOTE — Progress Notes (Signed)
 Patient's significant other performed bowel program.

## 2023-08-01 NOTE — Progress Notes (Signed)
 Inpatient Rehabilitation Care Coordinator Discharge Note   Patient Details  Name: Leon Rodriguez MRN: [id number] Date of Birth: [birth year]   Discharge location: D/c to home  Length of Stay: 14 days  Discharge activity level: wheelchair level Mod Assist  Home/community participation: Limited  Patient response un:Yzjouy Literacy - How often do you need to have someone help you when you read instructions, pamphlets, or other written material from your doctor or pharmacy?: Never  Patient response un:Dnrpjo Isolation - How often do you feel lonely or isolated from those around you?: Rarely  Services provided included: MD, RD, OT, PT, RN, CM, SW, Neuropsych, Pharmacy, TR  Financial Services:  Field Seismologist Utilized: [employer] [place] Medicaid healthy Blue  Choices offered to/list presented to: patient and pt s/o  Follow-up services arranged:  Home Health, DME Home Health Agency: [facility] for HHPT/OT/aide    DME : [facility] for TTB, DABSC, transfer board, hoyer lift, hospital bed; [employer]- specialty w/c    Patient response to transportation need: Is the patient able to respond to transportation needs?: Yes In the past 12 months, has lack of transportation kept you from medical appointments or from getting medications?: No In the past 12 months, has lack of transportation kept you from meetings, work, or from getting things needed for daily living?: No   Patient/Family verbalized understanding of follow-up arrangements:  Yes  Individual responsible for coordination of the follow-up plan: contact s/o Leon Rodriguez [phone]  Confirmed correct DME delivered: Leon Rodriguez 08/01/2023    Comments (or additional information):family edu completed  Summary of Stay    Date/Time Discharge Planning CSW  07/29/23 1519 D/c to home with s/o Leon Rodriguez. She is the only caregiver. Reports her dtr can help PRN some evenings. Disability referral sent to [facility]. PCS referral sent from previous admission to insurance- family needs to follow-up to schedule assessment. [facility] for HHPT/OT/aide. DME with [facility] for hospital bed, hoyer lift, DABSC, and TTB and to be delievered on Wednesday. Specialty w/c from [facility].  SW will confirm there are no barriers to discharge. AAC  07/22/23 1301 D/c to home with s/o Leon Rodriguez. She is the only caregiver. Reports her dtr can help PRN some evenings. Disability referral sent to [facility]. [facility] referral sent from previous admission to insurance- family needs to follow-up to schedule assessment. [facility] for HHPT/OT/aide. Will confirm DME needs. SW will confirm there are no barriers to discharge. AAC       Leon Rodriguez A Rodriguez

## 2023-08-07 ENCOUNTER — Telehealth: Payer: Self-pay | Admitting: Physical Medicine and Rehabilitation

## 2023-08-07 NOTE — Telephone Encounter (Signed)
Patient will needs 6 catheters per day- so 180 catheters/month  Length of need :permanent- at least 1 year Dx: urinary retention due to neurogenic bladder from incomplete quadriplegia- nontraumatic Needs closed system due to injury above T3 and 2 UTI's in hospital  between 06/13/23 and 08/01/23- 06/13/23 and 07/22/23 were UTI's both were Klebsiella Aerogenes.

## 2023-08-09 ENCOUNTER — Telehealth: Payer: Self-pay | Admitting: Physical Medicine and Rehabilitation

## 2023-08-09 MED ORDER — MORPHINE SULFATE ER 15 MG PO TBCR: 15.0000 mg | EXTENDED_RELEASE_TABLET | Freq: Every day | ORAL | 0 refills | Status: DC

## 2023-08-09 NOTE — Telephone Encounter (Signed)
Patient needs a refill on Morphine sent to [facility] and [facility].

## 2023-08-12 ENCOUNTER — Telehealth: Payer: Self-pay

## 2023-08-12 NOTE — Telephone Encounter (Signed)
Cover My Meds- MS Contin 15 mg 12 hr entered on 08/12/2023.

## 2023-08-13 NOTE — Telephone Encounter (Signed)
Approved on February 17 by [employer] [facility] [place] Case: [id number], Status: Approved, Coverage Starts on: 08/12/2023 12:00:00 AM, Coverage Ends on: 11/10/2023 12:00:00 AM.

## 2023-08-13 NOTE — Telephone Encounter (Signed)
PA approved 08/12/23-11/10/23

## 2023-08-16 ENCOUNTER — Other Ambulatory Visit (HOSPITAL_COMMUNITY): Payer: Self-pay

## 2023-08-16 ENCOUNTER — Telehealth: Payer: Self-pay | Admitting: *Deleted

## 2023-08-16 ENCOUNTER — Other Ambulatory Visit: Payer: Self-pay

## 2023-08-16 MED ORDER — DIAZEPAM 5 MG PO TABS
5.0000 mg | ORAL_TABLET | Freq: Four times a day (QID) | ORAL | 5 refills | Status: DC
  Filled 2023-08-16: qty 120, 30d supply, fill #0

## 2023-08-16 MED ORDER — BACLOFEN 10 MG PO TABS
10.0000 mg | ORAL_TABLET | Freq: Four times a day (QID) | ORAL | 5 refills | Status: DC | PRN
  Filled 2023-08-16: qty 390, 30d supply, fill #0
  Filled 2023-09-20: qty 390, 30d supply, fill #1

## 2023-08-16 MED ORDER — MORPHINE SULFATE ER 15 MG PO TBCR
15.0000 mg | EXTENDED_RELEASE_TABLET | Freq: Every day | ORAL | 0 refills | Status: DC
  Filled 2023-08-16: qty 30, 30d supply, fill #0

## 2023-08-16 MED ORDER — BACLOFEN 10 MG PO TABS
10.0000 mg | ORAL_TABLET | Freq: Four times a day (QID) | ORAL | 3 refills | Status: DC
  Filled 2023-08-16 – 2023-09-17 (×2): qty 120, 30d supply, fill #0
  Filled 2023-09-27 – 2023-10-17 (×2): qty 120, 30d supply, fill #1

## 2023-08-16 MED ORDER — DIAZEPAM 5 MG PO TABS
5.0000 mg | ORAL_TABLET | Freq: Four times a day (QID) | ORAL | 0 refills | Status: DC | PRN
  Filled 2023-08-16 – 2023-10-11 (×2): qty 60, 15d supply, fill #0

## 2023-08-16 NOTE — Telephone Encounter (Signed)
[facility]) called to request refills on baclofen and diazapam, and she is asking that we send his meds to [facility]. I have updated his profile.

## 2023-08-19 ENCOUNTER — Other Ambulatory Visit: Payer: Self-pay

## 2023-08-20 ENCOUNTER — Other Ambulatory Visit: Payer: Self-pay

## 2023-08-21 ENCOUNTER — Other Ambulatory Visit (HOSPITAL_COMMUNITY): Payer: Self-pay

## 2023-08-21 ENCOUNTER — Other Ambulatory Visit: Payer: Self-pay

## 2023-08-21 MED ORDER — SENNOSIDES-DOCUSATE SODIUM 8.6-50 MG PO TABS
3.0000 | ORAL_TABLET | Freq: Every morning | ORAL | 2 refills | Status: DC
Start: 2023-08-21 — End: 2023-11-20
  Filled 2023-08-22: qty 90, 30d supply, fill #0
  Filled 2023-09-17: qty 90, 30d supply, fill #1
  Filled 2023-10-22 (×2): qty 90, 30d supply, fill #2

## 2023-08-21 MED ORDER — QUETIAPINE FUMARATE 50 MG PO TABS
50.0000 mg | ORAL_TABLET | Freq: Every day | ORAL | 2 refills | Status: DC
  Filled 2023-08-22 – 2023-08-30 (×2): qty 30, 30d supply, fill #0

## 2023-08-21 MED ORDER — GUAIFENESIN ER 600 MG PO TB12
600.0000 mg | ORAL_TABLET | Freq: Two times a day (BID) | ORAL | 1 refills | Status: DC
Start: 2023-08-21 — End: 2023-09-03
  Filled 2023-08-21: qty 60, 30d supply, fill #0

## 2023-08-21 MED ORDER — DULOXETINE HCL 60 MG PO CPEP
60.0000 mg | ORAL_CAPSULE | Freq: Every day | ORAL | 2 refills | Status: DC
Start: 2023-08-21 — End: 2024-03-04
  Filled 2023-08-21: qty 30, 30d supply, fill #0
  Filled 2023-10-22 – 2023-12-14 (×2): qty 30, 30d supply, fill #1
  Filled 2024-01-13: qty 30, 30d supply, fill #2

## 2023-08-21 MED ORDER — DANTROLENE SODIUM 25 MG PO CAPS
25.0000 mg | ORAL_CAPSULE | Freq: Three times a day (TID) | ORAL | 3 refills | Status: DC
  Filled 2023-08-22: qty 90, 30d supply, fill #0
  Filled 2023-09-17: qty 90, 30d supply, fill #1
  Filled 2023-10-22 (×2): qty 90, 30d supply, fill #2
  Filled 2023-11-19 (×3): qty 90, 30d supply, fill #3

## 2023-08-21 MED ORDER — SIMETHICONE 80 MG PO CHEW
80.0000 mg | CHEWABLE_TABLET | Freq: Four times a day (QID) | ORAL | 2 refills | Status: DC
  Filled 2023-08-22: qty 120, 30d supply, fill #0

## 2023-08-21 MED ORDER — DULOXETINE HCL 60 MG PO CPEP
60.0000 mg | ORAL_CAPSULE | Freq: Every day | ORAL | 2 refills | Status: AC
Start: 2023-08-21 — End: ?
  Filled 2023-08-22 – 2023-09-17 (×2): qty 30, 30d supply, fill #0
  Filled 2023-10-17: qty 30, 30d supply, fill #1
  Filled 2023-11-19 (×2): qty 30, 30d supply, fill #2

## 2023-08-21 MED ORDER — TAMSULOSIN HCL 0.4 MG PO CAPS
0.4000 mg | ORAL_CAPSULE | Freq: Every evening | ORAL | 2 refills | Status: DC
  Filled 2023-08-22: qty 30, 30d supply, fill #0

## 2023-08-21 MED ORDER — TIZANIDINE HCL 2 MG PO TABS
2.0000 mg | ORAL_TABLET | Freq: Three times a day (TID) | ORAL | 2 refills | Status: DC
  Filled 2023-08-22: qty 90, 30d supply, fill #0
  Filled 2023-09-17: qty 90, 30d supply, fill #1
  Filled 2023-10-24: qty 90, 30d supply, fill #2

## 2023-08-21 MED ORDER — ELIQUIS 2.5 MG PO TABS
2.5000 mg | ORAL_TABLET | Freq: Two times a day (BID) | ORAL | 2 refills | Status: DC
Start: 2023-08-21 — End: 2023-11-20
  Filled 2023-08-22 – 2023-08-30 (×2): qty 60, 30d supply, fill #0
  Filled 2023-09-17 – 2023-10-17 (×2): qty 60, 30d supply, fill #1

## 2023-08-21 MED ORDER — ASCORBIC ACID 1000 MG PO TABS
1000.0000 mg | ORAL_TABLET | Freq: Every day | ORAL | 3 refills | Status: DC
  Filled 2023-08-21: qty 30, 30d supply, fill #0
  Filled 2023-09-17: qty 30, 30d supply, fill #1
  Filled 2023-10-22 (×2): qty 30, 30d supply, fill #2
  Filled 2023-11-30: qty 30, 30d supply, fill #3

## 2023-08-22 ENCOUNTER — Other Ambulatory Visit: Payer: Self-pay

## 2023-08-22 ENCOUNTER — Other Ambulatory Visit (HOSPITAL_COMMUNITY): Payer: Self-pay

## 2023-08-23 ENCOUNTER — Other Ambulatory Visit (HOSPITAL_COMMUNITY): Payer: Self-pay

## 2023-08-26 ENCOUNTER — Other Ambulatory Visit: Payer: Self-pay

## 2023-08-26 ENCOUNTER — Other Ambulatory Visit (HOSPITAL_COMMUNITY): Payer: Self-pay

## 2023-08-30 ENCOUNTER — Other Ambulatory Visit (HOSPITAL_COMMUNITY): Payer: Self-pay

## 2023-08-30 ENCOUNTER — Telehealth: Payer: Self-pay | Admitting: Physical Medicine and Rehabilitation

## 2023-08-30 NOTE — Telephone Encounter (Signed)
 Patient needs refills on Eliquis and Quetiapine. He is out of both medications.  [facility]

## 2023-08-30 NOTE — Telephone Encounter (Signed)
 Not a patient at [facility]  Thanks!

## 2023-09-03 NOTE — Progress Notes (Addendum)
 Subjective:    Patient ID: Leon [facility], male    DOB: 1965/09/08, 58 y.o.   MRN: [id number]  HPI Pt is a 58 yr old male with hx of severe cervical myelopathy s/p ACDF and PCDF C6-T1 by Dr Julane Ny- in January 2025 with severe uncontrolled spasticity as well as neurogenic bowel and bladder  Here for hospital f/u from CIR for SCI  Hasn't seen Dr Dorn Gaskins yet.    Spasticity just as bad as ever was.   Baclofen  30 mg 4x/day Dantrolene - 25 mg 3x/day Zanaflex - 2 mg TID Valium - 5 mg 4x/day Duloxetine - 60 mg daily-  MS Contin  15 mg at bedtime Not taking Oxycodone  Seroquel - 50 mg at bedtime Elquiis 2.5 mg BID-  (first surgery 12/11 and 2nd 1/17)    Not having more rehab or nursing coming to the house.   GF- Cannot go back to work because he cannot do anything.   Cannot find a butterfly bandage.  Almost out of medihoney.    Cathing q4 hours Eating some- skips lunch, but gives hima ensure shake and eats fruit- and dinner eats a little.  Scared of gaining weight.  Eats breakfast- small amount.   Spasms still the same-constantly- sleeps with L leg up in air. And R leg straight.    Moving bowels every day-  Some times not every day- but then goes 3 x the day later.  Still doing Enemeez- 1x/week.  Moving bowels pretty good, but no control of them.   Skin on buttocks OK- using desitin- sometimes gets blisters and desitin makes them go away.   Sitting in w/c after 1pm to 5pm- and then takes a nap til 7pm- and then w/c for 2 more hours.      Went and saw Urology- scheduled to get Urodynamics study.   LLE want to use brace- using his brothers brace- helps when uses it.  Brace for L knee that can change angle.  Is helpful.   B/L feet started swelling- better this am when elevates them  Wearing boots at night on feet B/L   Pain Inventory Average Pain 4 Pain Right Now 10 My pain is constant, sharp, burning, dull, stabbing, tingling, and aching  LOCATION OF PAIN  Pain in  both hips that run down both legs, pain in both shoulders  BOWEL Number of stools per week: 14 Oral laxative use Yes  Type of laxative Miralax  Enema or suppository use Yes  History of colostomy No  Incontinent Yes   BLADDER  In and out cath, frequency in/out every 4 hours with assistance.     Mobility ability to climb steps?  no do you drive?  no use a wheelchair needs help with transfers Do you have any goals in this area?  yes  Function disabled: date disabled Applied I need assistance with the following:  dressing, bathing, toileting, meal prep, household duties, and shopping Do you have any goals in this area?  yes  Neuro/Psych bladder control problems bowel control problems weakness numbness tremor trouble walking spasms anxiety  Prior Studies Any changes since last visit?  no  Physicians involved in your care Any changes since last visit?  no   No family history on file. Social History   Socioeconomic History   Marital status: Married    Spouse name: Not on file   Number of children: Not on file   Years of education: Not on file   Highest education level: Not on file  Occupational History  Not on file  Tobacco Use   Smoking status: Every Day    Types: Cigarettes, Cigars   Smokeless tobacco: Never  Vaping Use   Vaping status: Never Used  Substance and Sexual Activity   Alcohol use: Not Currently   Drug use: Yes    Types: Marijuana   Sexual activity: Not on file  Other Topics Concern   Not on file  Social History Narrative   Not on file   Social Drivers of Health   Financial Resource Strain: Not on file  Food Insecurity: Unknown (07/14/2023)   Hunger Vital Sign    Worried About Running Out of Food in the Last Year: Patient unable to answer    Ran Out of Food in the Last Year: Never true  Transportation Needs: No Transportation Needs (07/14/2023)   PRAPARE - Administrator, Civil Service (Medical): No    Lack of  Transportation (Non-Medical): No  Physical Activity: Not on file  Stress: Not on file  Social Connections: Not on file   Past Surgical History:  Procedure Laterality Date   ANTERIOR CERVICAL DECOMP/DISCECTOMY FUSION N/A 06/05/2023   Procedure: ANTERIOR CERVICAL DISCECTOMY AND FUSION CERVICAL SIX-SEVEN/CERVICAL SEVEN-THORACIC ONE;  Surgeon: Pincus Bridgeman, DO;  Location: [facility] OR;  Service: Neurosurgery;  Laterality: N/A;  3C   APPENDECTOMY     CIRCUMCISION     HIATAL HERNIA REPAIR     HIP SURGERY     at 49 and 58 yo   POSTERIOR CERVICAL FUSION/FORAMINOTOMY N/A 07/12/2023   Procedure: POSTERIOR CERVICAL LAMINECTOMY, INSTRUMENTATION AND FUSION CERVICAL SIX-CERVICAL SEVEN, CERVICAL SEVEN-THORACIC ONE;  Surgeon: Dawley, Colby Daub, DO;  Location: [facility] OR;  Service: Neurosurgery;  Laterality: N/A;   Past Medical History:  Diagnosis Date   Arthritis    Hip pain    Renal calculi    left   Sepsis (HCC) 06/13/2023   There were no vitals taken for this visit.  Opioid Risk Score:   Fall Risk Score:  `1  Depression screen [facility] 2/9     04/05/2023    9:16 AM 03/06/2023    8:45 AM 01/16/2023    1:35 PM  Depression screen PHQ 2/9  Decreased Interest 3 3 0  Down, Depressed, Hopeless 3 3 0  PHQ - 2 Score 6 6 0  Altered sleeping 3 3 3   Tired, decreased energy 3 3 0  Change in appetite 3 2 0  Feeling bad or failure about yourself  0 0 0  Trouble concentrating 3 3 0  Moving slowly or fidgety/restless 3 3 0  Suicidal thoughts 0 0 0  PHQ-9 Score 21 20 3   Difficult doing work/chores Extremely dIfficult Somewhat difficult Not difficult at all    Review of Systems  Gastrointestinal:        Stool incontinence   Genitourinary:        In & out caths  Musculoskeletal:  Positive for gait problem.       Spasms  Neurological:  Positive for tremors, weakness and numbness.  Psychiatric/Behavioral:         Anxiety  All other systems reviewed and are negative.      Objective:   Physical Exam Awake,  alert, appropriate, less sedated; accompanied by GF, NAD Wearing mask In power w/c; loaner  MSK:  LUE- Biceps, triceps and WE 5/5; grip 4+/5; and FA 4/5 RUE- Biceps, triceps and WE 5/5; grip 4+/5 and FA 4-/5 BLE- HF 1/5 B/L; KE 2/5- DF and PF 2/5  B/L-  Wearing weight son legs to help with spasticity Towel between legs to keep legs apart to keep legs from scissoring  Neuro: goes into severe spams/extensor tone with any ROM- MAS of 3 in LE's- and Ue's slightly better- MAS of 1+ to 2 throughout        Assessment & Plan:   Pt is a 58 yr old male with hx of severe cervical myelopathy s/p ACDF and PCDF C6-T1 by Dr Dawley- with severe uncontrolled spasticity as well as neurogenic bowel and bladder  Here for hospital f/u from CIR for SCI  Have pt see Dr Dorn Gaskins ASAP for phenol and Botulinum toxin- - since cannot get ITB pump for spasticity  2. Can finish out Apixiban Rx/Eliquis - finish the bottle, and then stop afterwards- don't have ot wean it.   3.  Cannot get back into therapy H/H until gets spasticity better controlled.    4. Will write for Medihoney- since was working somewhat for L heel stage IV pressure ulcer- has to get bandages.    5. H/H- for wound care- H/H for PT< OT, nursing and HHA if possible.    6. Wound care referral- ASAP due to stage IV pressure ulcer.    7. Will write order for  for L knee brace to dial angle for severe spasticity- watch skin to make sure no skin breakdown- can cause skin issues   8.  Always deny disability- appeal it-    9. Send me paperwork if needs something filled out.    10. Con't Duloxetine , Baclofen , Valium  - sent in Valium  refill- has Zanaflex  rx's- cont' regimen  11.  F/U in 3 months- double appt- F/U with Dr Dorn Gaskins ASAP  12. Patient needs hospital bed due to incomplete quadriplegia and has to have to be able to move in bed/position to prevent Pressure ulcers and skin breakdown and to manage spasticity-  1) Patient requires  positioning of body in ways not feasible in ordinary bed.   2) Patient requires attachments that can't be used on an ordinary bed.   3) Frequent and sometimes immediate body change is required.   4) Patient can safely and independently operate controls.   Addendum- pt having difficulty getting up in the bed- so therapy suggested and I think a worthwhile suggestions to get pt trapeze to pull up some in bed to get repositioned-    I spent a total of 37   minutes on total care today- >50% coordination of care- due to d/w pt about issues as detailed above- and referral to Dr Dorn Gaskins

## 2023-09-04 ENCOUNTER — Encounter: Payer: Self-pay | Admitting: Physical Medicine and Rehabilitation

## 2023-09-04 ENCOUNTER — Other Ambulatory Visit (HOSPITAL_COMMUNITY): Payer: Self-pay

## 2023-09-04 ENCOUNTER — Encounter
Payer: Medicaid Other | Attending: Physical Medicine and Rehabilitation | Admitting: Physical Medicine and Rehabilitation

## 2023-09-04 VITALS — BP 100/66 | HR 98 | Ht 67.0 in

## 2023-09-04 DIAGNOSIS — M792 Neuralgia and neuritis, unspecified: Secondary | ICD-10-CM

## 2023-09-04 DIAGNOSIS — L89894 Pressure ulcer of other site, stage 4: Secondary | ICD-10-CM | POA: Diagnosis not present

## 2023-09-04 DIAGNOSIS — R252 Cramp and spasm: Secondary | ICD-10-CM | POA: Diagnosis present

## 2023-09-04 DIAGNOSIS — G959 Disease of spinal cord, unspecified: Secondary | ICD-10-CM | POA: Diagnosis not present

## 2023-09-04 DIAGNOSIS — G8250 Quadriplegia, unspecified: Secondary | ICD-10-CM | POA: Diagnosis not present

## 2023-09-04 MED ORDER — POLYETHYLENE GLYCOL 3350 17 GM/SCOOP PO POWD
17.0000 g | Freq: Every day | ORAL | 5 refills | Status: DC
  Filled 2023-09-04: qty 238, 14d supply, fill #0
  Filled 2023-09-17: qty 238, 14d supply, fill #1
  Filled 2023-09-30: qty 238, 14d supply, fill #2
  Filled 2023-10-11: qty 238, 14d supply, fill #3
  Filled 2023-10-22: qty 238, 14d supply, fill #4
  Filled 2023-11-07: qty 238, 14d supply, fill #5
  Filled 2023-11-19 (×2): qty 238, 14d supply, fill #6
  Filled 2023-11-30: qty 238, 14d supply, fill #7
  Filled 2023-12-14: qty 238, 14d supply, fill #8
  Filled 2023-12-26 (×3): qty 510, 30d supply, fill #9
  Filled 2024-01-22: qty 238, 14d supply, fill #10
  Filled 2024-02-10: qty 119, 7d supply, fill #11

## 2023-09-04 MED ORDER — MORPHINE SULFATE ER 15 MG PO TBCR
15.0000 mg | EXTENDED_RELEASE_TABLET | Freq: Every day | ORAL | 0 refills | Status: DC
  Filled 2023-09-04: qty 30, 30d supply, fill #0

## 2023-09-04 MED ORDER — MEDIHONEY WOUND/BURN DRESSING EX PSTE
1.0000 | PASTE | Freq: Every day | CUTANEOUS | 2 refills | Status: DC
  Filled 2023-09-04: qty 44, 44d supply, fill #0

## 2023-09-04 MED ORDER — DIAZEPAM 5 MG PO TABS
5.0000 mg | ORAL_TABLET | Freq: Four times a day (QID) | ORAL | 5 refills | Status: DC
  Filled 2023-09-04 – 2023-09-13 (×2): qty 120, 30d supply, fill #0

## 2023-09-04 NOTE — Patient Instructions (Signed)
 Pt is a 58 yr old male with hx of severe cervical myelopathy s/p ACDF and PCDF C6-T1 by Dr Dawley- with severe uncontrolled spasticity as well as neurogenic bowel and bladder  Here for hospital f/u from CIR for SCI  Have pt see Dr Shearon Stalls ASAP for phenol and Botulinum toxin- - since cannot get ITB pump for spasticity  2. Can finish out Apixiban Rx/Eliquis- finish the bottle, and then stop afterwards- don't have ot wean it.   3.  Cannot get back into therapy H/H until gets spasticity better controlled.    4. Will write for Medihoney- since was working somewhat for L heel stage IV pressure ulcer- has to get bandages.    5. H/H- for wound care- H/H for PT< OT, nursing and HHA if possible.    6. Wound care referral- ASAP due to stage IV pressure ulcer.    7. Will write order for  for L knee brace to dial angle for severe spasticity- watch skin to make sure no skin breakdown- can cause skin issues   8.  Always deny disability- appeal it-    9. Send me paperwork if needs something filled out.    10. Con't Duloxetine, Baclofen, Valium - sent in Valium refill- has Zanaflex rx's- cont' regimen  11.  F/U in 3 months- double appt- F/U with Dr Shearon Stalls ASAP

## 2023-09-05 ENCOUNTER — Other Ambulatory Visit: Payer: Self-pay

## 2023-09-06 ENCOUNTER — Other Ambulatory Visit (HOSPITAL_COMMUNITY): Payer: Self-pay

## 2023-09-10 ENCOUNTER — Other Ambulatory Visit (HOSPITAL_COMMUNITY): Payer: Self-pay

## 2023-09-10 ENCOUNTER — Other Ambulatory Visit: Payer: Self-pay

## 2023-09-13 ENCOUNTER — Other Ambulatory Visit (HOSPITAL_COMMUNITY): Payer: Self-pay

## 2023-09-17 ENCOUNTER — Other Ambulatory Visit (HOSPITAL_COMMUNITY): Payer: Self-pay

## 2023-09-17 ENCOUNTER — Telehealth: Payer: Self-pay | Admitting: *Deleted

## 2023-09-17 NOTE — Telephone Encounter (Signed)
 Delorise Shiner PT from [facility] called to request VO for POC 1wk2, 2wk3,1wk4. Approval given.

## 2023-09-18 ENCOUNTER — Other Ambulatory Visit (HOSPITAL_COMMUNITY): Payer: Self-pay

## 2023-09-18 ENCOUNTER — Other Ambulatory Visit: Payer: Self-pay

## 2023-09-18 MED ORDER — QUETIAPINE FUMARATE 50 MG PO TABS
50.0000 mg | ORAL_TABLET | Freq: Every day | ORAL | 2 refills | Status: DC
  Filled 2023-09-18 – 2023-09-27 (×3): qty 30, 30d supply, fill #0

## 2023-09-18 MED ORDER — BACLOFEN 20 MG PO TABS
40.0000 mg | ORAL_TABLET | Freq: Four times a day (QID) | ORAL | 2 refills | Status: DC
  Filled 2023-09-18 – 2023-09-27 (×2): qty 240, 30d supply, fill #0

## 2023-09-18 MED ORDER — TAMSULOSIN HCL 0.4 MG PO CAPS
0.4000 mg | ORAL_CAPSULE | Freq: Every evening | ORAL | 2 refills | Status: DC
Start: 2023-09-18 — End: 2023-12-30
  Filled 2023-09-18: qty 30, 30d supply, fill #0
  Filled 2023-10-17: qty 30, 30d supply, fill #1
  Filled 2023-10-30 – 2023-11-19 (×3): qty 30, 30d supply, fill #2

## 2023-09-19 ENCOUNTER — Other Ambulatory Visit (HOSPITAL_COMMUNITY): Payer: Self-pay

## 2023-09-19 MED ORDER — CVS MANUKA HONEY WOUND EX GEL: CUTANEOUS | 3 refills | Status: DC

## 2023-09-20 ENCOUNTER — Other Ambulatory Visit (HOSPITAL_COMMUNITY): Payer: Self-pay

## 2023-09-20 ENCOUNTER — Other Ambulatory Visit: Payer: Self-pay

## 2023-09-27 ENCOUNTER — Telehealth: Payer: Self-pay | Admitting: Physical Medicine and Rehabilitation

## 2023-09-27 ENCOUNTER — Other Ambulatory Visit (HOSPITAL_COMMUNITY): Payer: Self-pay

## 2023-09-27 ENCOUNTER — Other Ambulatory Visit: Payer: Self-pay

## 2023-09-27 MED ORDER — BACLOFEN 20 MG PO TABS
40.0000 mg | ORAL_TABLET | Freq: Four times a day (QID) | ORAL | 2 refills | Status: DC
  Filled 2023-09-27: qty 240, 30d supply, fill #0
  Filled 2023-10-22 (×2): qty 240, 30d supply, fill #1
  Filled 2023-11-21: qty 240, 30d supply, fill #2

## 2023-09-27 NOTE — Telephone Encounter (Signed)
 Patient will be out of Baclofen today.  Can a prescription be sent in to [facility]?  Please call Haze Justin @ [phone].

## 2023-09-30 ENCOUNTER — Other Ambulatory Visit (HOSPITAL_COMMUNITY): Payer: Self-pay

## 2023-10-09 ENCOUNTER — Encounter: Attending: Physical Medicine and Rehabilitation | Admitting: Physical Medicine and Rehabilitation

## 2023-10-09 ENCOUNTER — Encounter: Payer: Self-pay | Admitting: Physical Medicine and Rehabilitation

## 2023-10-09 VITALS — BP 100/66 | HR 93 | Ht 69.0 in

## 2023-10-09 DIAGNOSIS — G8250 Quadriplegia, unspecified: Secondary | ICD-10-CM

## 2023-10-09 DIAGNOSIS — R252 Cramp and spasm: Secondary | ICD-10-CM | POA: Diagnosis not present

## 2023-10-09 MED ORDER — PHENOL 5% (AQUEOUS) FOR INJECTION
10.0000 mL | Freq: Once | Status: AC
Start: 2023-10-09 — End: 2023-10-09
  Administered 2023-10-09: 10 mL via INTRAMUSCULAR

## 2023-10-09 MED ORDER — ONABOTULINUMTOXINA 100 UNITS IJ SOLR
400.0000 [IU] | Freq: Once | INTRAMUSCULAR | Status: AC
Start: 2023-10-09 — End: 2023-10-09
  Administered 2023-10-09: 400 [IU] via INTRAMUSCULAR

## 2023-10-09 MED ORDER — SODIUM CHLORIDE (PF) 0.9 % IJ SOLN
4.0000 mL | Freq: Once | INTRAMUSCULAR | Status: AC
  Administered 2023-10-09: 4 mL

## 2023-10-09 NOTE — Patient Instructions (Signed)
-   Resume Usual Activities. Notify Physician of any unusual bleeding, erythema or concern for side effects as reviewed above. - Apply ice prn for pain - Tylenol prn for pain - Follow up in 6-8 weeks to assess response to injection

## 2023-10-09 NOTE — Progress Notes (Signed)
 Leon Rodriguez is a 58 y.o. year old male  who  has a past medical history of Arthritis, Hip pain, Renal calculi, and Sepsis (HCC) (06/13/2023).   They are presenting to PM&R clinic for follow up related to lower extremity spasticity s/p  s/p ACDF and PCDF C6-T1 by Dr Julane Ny. Referred by Dr Lovorn for botox  and phenol neurolysis.  Per patient,  difficult with hygiene due to scissoring, severe pain and unable to trial WB in standing frame because of tone.   Exam: BLE- HF 1/5 B/L; KE 2/5- DF and PF 2/5 B/L- unchanged   Neuro: goes into severe spams/extensor tone with any ROM- MAS of 4 LLE HF, KF, adductors; Mas 2 PF RLE MAS 3  HF, KF, adductors, Mas 2 PF   Botolulinum Toxin Injection: [x ] BOTOX  (onabotulinumtoxinA ) [_] DYSPORT (abobotulinumtoxinA) [_] Xeomin (Incobotulinum toxin A)  Goals with treatment: [x ] Decrease spasms/ abnormal movements [x ] Improve Active / Passive ROM [x ] Improve ADLs [ x] Improve functional mobility [ ]  Improve gait mechanics [x ] Improve positioning/posture [x ] Prevent contracture  [x ] Prevent joint destruction [ x] Prevent skin breakdown [ x] Decrease caregiver burden [ x] Improve hygiene [ x] Improve Pain  MEDICATION:  ONAbotulinum toxin. 400 Units    NaCl 4 ml  CONSENT: Obtained in writing followed by time-out per policy. Consent uploaded to chart.  Benefits discussed included, but were not limited to, decreased muscle tightness and spasticity, increased joint range of motion, improved limb positioning and facilitation of hygiene and nursing care.   Risks discussed included, but were not limited to, pain and discomfort, bleeding, bruising, excessive weakness, venous thrombosis, muscle atrophy, and distant spread of toxin which could include generalized muscle weakness, diplopia, blurred vision, ptosis, dysphagia, dysphonia, dysarthria, urinary incontinence and breathing difficulties. These symptoms have been reported hours to weeks after  injection. Swallowing and breathing difficulties may be life threatening, and there have been reports of death. Patient/Family member/Guardian/Caregiver have been offered botulinum toxin informational material upon initial consultation and this information has been continually available. All questions answered to patient/family member/guardian/ caregiver satisfaction. They would like to proceed with procedure. There are no noted contraindications to procedure.  PROCEDURE [x]  Without Ultrasound: Patient was placed in a position with the appropriate muscles exposed, located and identified. The skin was cleaned with ChloraPrep and ethyl chloride was sprayed for topical anesthetic. Using combination EMG amplification and electrical stimulation, the following muscles were identified using anatomical landmarks described by Perotto, et al (1994) and injected following aspiration to ensure blood vessels were avoided.  [_ ] With Ultrasound: Patient was placed in a position with the appropriate muscles exposed, located and identified. The skin was cleaned with ChloraPrep and ethyl chloride was sprayed for topical anesthetic. Using combination Ultrasound guidance for anatomical guidance, avoidance of significant vasculature, to decrease the risk of hematoma formation and ensure botox  was placed in the correct location; EMG amplification and electrical stimulation, the following muscles were identified and injected following aspiration to ensure blood vessels were avoided. A _linear transducer was used during the procedure. The botulinum toxin was visualized entering the appropriate musculature.   MUSCLE: Units /Sites 200 U LLE 50 U psoas 100 U hamstrings - 50 medial, 50 lateral, 2 sites ea 50 U adductors  200 U RLE 50 U psoas 100 U hamstrings - 50 medial, 50 lateral, 2 sites ea 50 U adductors  400 units were injected without difficulty. No complications were encountered. The patient tolerated  the procedure  well. Wasted 0  ---------------------------------------------------------------------------- Phenol Neurolysis:  Indication:  same as Botox  above  Nerve target: Bilateral Obturator n.  Technique:  The pubic tubercle was identified by palpation, and 4 cm was drawn laterally, then 3 cm inferiorly was drawn to locate the area over the obturator foramen. The femoral artery was palpated laterally to confirm adequate distance from the vessel. Then, a 22 gauge, 3 in needle attached to a nerve stimulator was advanced cephalad and laterally at an angle of 45 degrees  until contractions of the thigh adductor muscles are observed with minimal stimulation ( 1 on L , 2 on R). 5 mL of pre-mixed phenol were injected into the obturator foramen. There were no complications during the procedure and the patient tolerated the injection well with minimal pain.    PLAN: - Resume Usual Activities. Notify Physician of any unusual bleeding, erythema or concern for side effects as reviewed above. - Apply ice prn for pain - Tylenol  prn for pain - Follow up in 6-8 weeks to assess response to injections

## 2023-10-10 ENCOUNTER — Other Ambulatory Visit (HOSPITAL_COMMUNITY): Payer: Self-pay

## 2023-10-11 ENCOUNTER — Other Ambulatory Visit (HOSPITAL_COMMUNITY): Payer: Self-pay

## 2023-10-17 ENCOUNTER — Other Ambulatory Visit (HOSPITAL_COMMUNITY): Payer: Self-pay

## 2023-10-18 ENCOUNTER — Encounter (HOSPITAL_BASED_OUTPATIENT_CLINIC_OR_DEPARTMENT_OTHER): Attending: General Surgery | Admitting: General Surgery

## 2023-10-18 DIAGNOSIS — L89624 Pressure ulcer of left heel, stage 4: Secondary | ICD-10-CM | POA: Diagnosis present

## 2023-10-18 DIAGNOSIS — G8250 Quadriplegia, unspecified: Secondary | ICD-10-CM | POA: Diagnosis not present

## 2023-10-18 DIAGNOSIS — L89613 Pressure ulcer of right heel, stage 3: Secondary | ICD-10-CM | POA: Diagnosis not present

## 2023-10-22 ENCOUNTER — Other Ambulatory Visit: Payer: Self-pay

## 2023-10-22 ENCOUNTER — Other Ambulatory Visit (HOSPITAL_COMMUNITY): Payer: Self-pay

## 2023-10-22 ENCOUNTER — Telehealth: Payer: Self-pay | Admitting: Physical Medicine and Rehabilitation

## 2023-10-22 NOTE — Telephone Encounter (Signed)
 Sent a staff message to pt [employer] info so can appeal getting pt his  hospital bed.  Made addendum  Attempted to call [name] Lincoln Renshaw, his caretaker to  let her know- didn't go to voicemail- just rang- if hear back, will ask how doing with spasticity since seen by Dr Dorn Gaskins.

## 2023-10-23 ENCOUNTER — Other Ambulatory Visit (HOSPITAL_COMMUNITY): Payer: Self-pay

## 2023-10-24 ENCOUNTER — Other Ambulatory Visit (HOSPITAL_COMMUNITY): Payer: Self-pay

## 2023-10-25 ENCOUNTER — Other Ambulatory Visit (HOSPITAL_COMMUNITY): Payer: Self-pay

## 2023-10-28 ENCOUNTER — Telehealth: Payer: Self-pay | Admitting: *Deleted

## 2023-10-28 ENCOUNTER — Other Ambulatory Visit (HOSPITAL_COMMUNITY): Payer: Self-pay

## 2023-10-28 ENCOUNTER — Other Ambulatory Visit: Payer: Self-pay

## 2023-10-28 ENCOUNTER — Ambulatory Visit (HOSPITAL_BASED_OUTPATIENT_CLINIC_OR_DEPARTMENT_OTHER): Admitting: General Surgery

## 2023-10-28 MED ORDER — QUETIAPINE FUMARATE 50 MG PO TABS
50.0000 mg | ORAL_TABLET | Freq: Every day | ORAL | 5 refills | Status: DC
  Filled 2023-10-28: qty 30, 30d supply, fill #0
  Filled 2023-11-21: qty 30, 30d supply, fill #1
  Filled 2023-12-24 (×2): qty 30, 30d supply, fill #2
  Filled 2024-01-22: qty 30, 30d supply, fill #3
  Filled 2024-02-18: qty 30, 30d supply, fill #4

## 2023-10-28 MED ORDER — DIAZEPAM 5 MG PO TABS
5.0000 mg | ORAL_TABLET | Freq: Four times a day (QID) | ORAL | 5 refills | Status: DC | PRN
  Filled 2023-10-28: qty 60, 15d supply, fill #0
  Filled 2023-11-14: qty 60, 15d supply, fill #1
  Filled 2023-11-30: qty 60, 15d supply, fill #2

## 2023-10-28 NOTE — Telephone Encounter (Signed)
-----   Message from [facility] sent at 10/22/2023 10:11 AM EDT -----  Can you guys forward my note from 09/04/23- Dr Roxan Copes note- to [employer] with the changes/addendum I made today so he can get the hospital bed.   Thanks- ML ----- Message ----- From: Bea Lime, DO Sent: 10/22/2023  12:35 AM EDT To: Celia Coles, MD   ----- Message ----- From: Jairo Mayer Sent: 10/16/2023  12:14 PM EDT To: Bea Lime, DO   ----- Message ----- From: Awanda Bogaert Sent: 10/16/2023  11:49 AM EDT To: Zelda Hickman, PA-C  Hi Dr. Dania Dupre,   Our office received a call from [employer] regarding equipment denial for the attached patient due to notes not written correctly, so he was denied the bed he desperately needs. A representative from [employer] gave me the verbiage needed. Please revise your notes to reflect the following and resubmit:   1) Patient requires positioning of body in ways not feasible in ordinary bed.   2) Patient requires attachments that can't be used on an ordinary bed.   3) Frequent and sometimes immediate body change is required.  4) Patient can safely and independently operate controls.    You may contact the [employer] rep Creasie Doctor) directly at:  [phone] [ext. [phone] [email or web address]    Thank you,  Luciano Ruths Acute Child psychotherapist  [phone]

## 2023-10-28 NOTE — Telephone Encounter (Signed)
 Notes faxed.

## 2023-10-29 ENCOUNTER — Encounter (HOSPITAL_BASED_OUTPATIENT_CLINIC_OR_DEPARTMENT_OTHER): Attending: General Surgery | Admitting: General Surgery

## 2023-10-29 ENCOUNTER — Other Ambulatory Visit (HOSPITAL_COMMUNITY): Payer: Self-pay

## 2023-10-29 DIAGNOSIS — L89613 Pressure ulcer of right heel, stage 3: Secondary | ICD-10-CM | POA: Insufficient documentation

## 2023-10-29 DIAGNOSIS — G8250 Quadriplegia, unspecified: Secondary | ICD-10-CM | POA: Insufficient documentation

## 2023-10-29 DIAGNOSIS — L89624 Pressure ulcer of left heel, stage 4: Secondary | ICD-10-CM | POA: Diagnosis present

## 2023-10-29 DIAGNOSIS — Z981 Arthrodesis status: Secondary | ICD-10-CM | POA: Insufficient documentation

## 2023-10-31 ENCOUNTER — Other Ambulatory Visit: Payer: Self-pay

## 2023-10-31 ENCOUNTER — Telehealth: Payer: Self-pay | Admitting: Physical Medicine and Rehabilitation

## 2023-10-31 ENCOUNTER — Other Ambulatory Visit: Payer: Self-pay | Admitting: Physical Medicine and Rehabilitation

## 2023-10-31 ENCOUNTER — Other Ambulatory Visit (HOSPITAL_COMMUNITY): Payer: Self-pay

## 2023-10-31 MED ORDER — MORPHINE SULFATE ER 15 MG PO TBCR
15.0000 mg | EXTENDED_RELEASE_TABLET | Freq: Every day | ORAL | 0 refills | Status: DC
  Filled 2023-10-31: qty 30, 30d supply, fill #0

## 2023-10-31 NOTE — Telephone Encounter (Signed)
 Josiah Nigh (OT from [facility]) is calling on behalf of patient to see if Dr. Raynaldo Call feels that the patient needs a trapeze bar. If yes, the rx can be sent to [employer]. He can be reached at [phone] with any questions or if more documentation is needed.

## 2023-11-07 ENCOUNTER — Other Ambulatory Visit: Payer: Self-pay

## 2023-11-07 ENCOUNTER — Other Ambulatory Visit (HOSPITAL_COMMUNITY): Payer: Self-pay

## 2023-11-08 ENCOUNTER — Ambulatory Visit (HOSPITAL_BASED_OUTPATIENT_CLINIC_OR_DEPARTMENT_OTHER): Admitting: Internal Medicine

## 2023-11-08 ENCOUNTER — Telehealth: Payer: Self-pay

## 2023-11-08 ENCOUNTER — Other Ambulatory Visit (HOSPITAL_COMMUNITY): Payer: Self-pay

## 2023-11-08 NOTE — Telephone Encounter (Signed)
 Pt. Was notified that the fax was sent.

## 2023-11-08 NOTE — Telephone Encounter (Signed)
-----   Message from [facility] sent at 10/22/2023 10:11 AM EDT -----  Can you guys forward my note from 09/04/23- Dr Roxan Copes note- to [employer] with the changes/addendum I made today so he can get the hospital bed.   Thanks- ML ----- Message ----- From: Bea Lime, DO Sent: 10/22/2023  12:35 AM EDT To: Celia Coles, MD   ----- Message ----- From: Jairo Mayer Sent: 10/16/2023  12:14 PM EDT To: Bea Lime, DO   ----- Message ----- From: Awanda Bogaert Sent: 10/16/2023  11:49 AM EDT To: Zelda Hickman, PA-C  Hi Dr. Dania Dupre,   Our office received a call from [employer] regarding equipment denial for the attached patient due to notes not written correctly, so he was denied the bed he desperately needs. A representative from [employer] gave me the verbiage needed. Please revise your notes to reflect the following and resubmit:   1) Patient requires positioning of body in ways not feasible in ordinary bed.   2) Patient requires attachments that can't be used on an ordinary bed.   3) Frequent and sometimes immediate body change is required.  4) Patient can safely and independently operate controls.    You may contact the [employer] rep Creasie Doctor) directly at:  [phone] [ext. [phone] [email or web address]    Thank you,  Luciano Ruths Acute Child psychotherapist  [phone]

## 2023-11-08 NOTE — Telephone Encounter (Signed)
 Orders faxed to [facility] for Trapeze for hospital bed.

## 2023-11-11 ENCOUNTER — Other Ambulatory Visit (HOSPITAL_COMMUNITY): Payer: Self-pay

## 2023-11-11 ENCOUNTER — Encounter (HOSPITAL_BASED_OUTPATIENT_CLINIC_OR_DEPARTMENT_OTHER): Admitting: General Surgery

## 2023-11-11 ENCOUNTER — Telehealth: Payer: Self-pay

## 2023-11-11 DIAGNOSIS — L89624 Pressure ulcer of left heel, stage 4: Secondary | ICD-10-CM | POA: Diagnosis not present

## 2023-11-11 MED ORDER — MORPHINE SULFATE 15 MG PO TABS
15.0000 mg | ORAL_TABLET | Freq: Four times a day (QID) | ORAL | 0 refills | Status: DC | PRN
  Filled 2023-11-11: qty 60, 15d supply, fill #0

## 2023-11-11 NOTE — Telephone Encounter (Signed)
 Patient needs refill on Morphine  but, [facility] is [facility] out [facility].  Patients spouse will call other pharmacies to see if its available and then call the office back to let us  know where to send the rx or if it may need to be reviewed and possibly temporarily changed.  While await a call from [name].

## 2023-11-11 NOTE — Addendum Note (Signed)
 Addended by: Syona Wroblewski on: 11/11/2023 04:39 PM   Modules accepted: Orders

## 2023-11-11 NOTE — Telephone Encounter (Signed)
 Patients spouse calling back into the office to report that she called multiple pharmacies and they do not have the Morphine  in stock.  Spouse states that [facility] did advise to request another pain medication due to not having this medication in stock.  Please advise if medication can be changed this time.

## 2023-11-12 ENCOUNTER — Other Ambulatory Visit: Payer: Self-pay

## 2023-11-12 ENCOUNTER — Other Ambulatory Visit (HOSPITAL_COMMUNITY): Payer: Self-pay

## 2023-11-14 ENCOUNTER — Other Ambulatory Visit (HOSPITAL_COMMUNITY): Payer: Self-pay

## 2023-11-14 ENCOUNTER — Telehealth: Payer: Self-pay

## 2023-11-14 NOTE — Telephone Encounter (Signed)
 Patient needs a PA for the RX Morphine . Per pharmacy he can pick up a  5 day supply.

## 2023-11-14 NOTE — Telephone Encounter (Signed)
 Leon Rodriguez (Key: [id number]) [employer] [place]  Medicaid is processing your PA request and will respond shortly with next steps. You may close this dialog, return to your dashboard, and perform other tasks. To check for an update later, open this request again from your dashboard.  If you need assistance, please chat with CoverMyMeds or call us  at [phone].

## 2023-11-15 NOTE — Telephone Encounter (Signed)
 Bassel Gaskill (Key: [id number]) PA Case ID #: [id number] Rx #: [id number]  sent to plan [employer]

## 2023-11-19 ENCOUNTER — Encounter: Payer: Self-pay | Admitting: *Deleted

## 2023-11-19 ENCOUNTER — Other Ambulatory Visit (HOSPITAL_COMMUNITY): Payer: Self-pay

## 2023-11-19 NOTE — Telephone Encounter (Signed)
 Outcome Approved on May 23 by [facility]  Medicaid PA Case: [id number], Status: Approved, Coverage Starts on: 11/15/2023 12:00:00 AM, Coverage Ends on: 05/13/2024 12:00:00 AM. Effective Date: 11/15/2023 Authorization Expiration Date: 05/13/2024

## 2023-11-20 ENCOUNTER — Encounter: Attending: Physical Medicine and Rehabilitation | Admitting: Physical Medicine and Rehabilitation

## 2023-11-20 ENCOUNTER — Encounter: Payer: Self-pay | Admitting: Physical Medicine and Rehabilitation

## 2023-11-20 ENCOUNTER — Other Ambulatory Visit: Payer: Self-pay

## 2023-11-20 VITALS — BP 113/76 | HR 96 | Ht 69.0 in

## 2023-11-20 DIAGNOSIS — G8250 Quadriplegia, unspecified: Secondary | ICD-10-CM | POA: Diagnosis present

## 2023-11-20 DIAGNOSIS — R252 Cramp and spasm: Secondary | ICD-10-CM | POA: Insufficient documentation

## 2023-11-20 DIAGNOSIS — G959 Disease of spinal cord, unspecified: Secondary | ICD-10-CM | POA: Diagnosis present

## 2023-11-20 MED ORDER — DANTROLENE SODIUM 25 MG PO CAPS
ORAL_CAPSULE | ORAL | 0 refills | Status: DC
  Filled 2023-11-20: qty 318, 30d supply, fill #0

## 2023-11-20 NOTE — Patient Instructions (Addendum)
 I will inquire with your insurance regarding denials of further PT through [facility]; I will give you a call when I hear an answer back on this.  Per our discussion with Dr. Lovorn today, we are increasing dantrolene  to 50 mg 3 times daily for a week, then to 100 mg three times daily. You will follow up with her for medications as scheduled.  Follow up with me for Botox  (or xeomin if covered by insurance) in July and 6 weeks later to assess response to injection; we will increase the dose for the right adductors and left adductors, knee flexors, and hip flexors.   It is up to you whether to repeat hip steroid injections  Per discussion with D.r Lovorn, patient is not a baclofen  pump candidate. We will look into a referral for imaging guided neurolysis and repeeat botox  once; if no improvements after that, we will stop these interventions

## 2023-11-20 NOTE — Progress Notes (Signed)
 Subjective:    Patient ID: Leon Rodriguez, male    DOB: [birth year], 58 y.o.   MRN: [id number]  HPI  Leon Rodriguez is a 58 y.o. year old male  who  has a past medical history of Arthritis, Hip pain, Renal calculi, and Sepsis (HCC) (06/13/2023).   They are presenting to PM&R clinic for follow up related to lower extremity spasticity s/p  s/p ACDF and PCDF C6-T1 by Dr Julane Ny. Referred by Dr Lovorn for botox  and phenol neurolysis--performed 10/09/23.   Plan from last visit: MUSCLE: Units /Sites 200 U LLE 50 U psoas 100 U hamstrings - 50 medial, 50 lateral, 2 sites ea 50 U adductors   200 U RLE 50 U psoas 100 U hamstrings - 50 medial, 50 lateral, 2 sites ea 50 U adductors  5 ml phenol b/l obturator N.   Interval Hx: Patient states the botox  and phenol made his spasticity worse; pain is the same. He has not had any PT since his last visit "because insurance said it's not enough that the doctor says you need physical therapy, they didn't have enough information."   [facility] said they were unsure why his PT was started and stopped-- had only done 2 weeks of PT this year before he was cut off.  He has been doing home exercises, stretches as tolerated but this worsens his spasms. The spasms "are literally pulling my hips out of place."    He is not doing any bracing at this point. --cannot tolerate,  Pain Inventory Average Pain 10 Pain Right Now 9 My pain is sharp, dull, stabbing, and aching  In the last 24 hours, has pain interfered with the following? General activity 10 Relation with others 10 Enjoyment of life 10 What TIME of day is your pain at its worst? morning , daytime, evening, and night Sleep (in general) Poor  Pain is worse with: sitting Pain improves with: medication Relief from Meds: 1  No family history on file. Social History   Socioeconomic History   Marital status: Married    Spouse name: Not on file   Number of children: Not on file   Years of  education: Not on file   Highest education level: Not on file  Occupational History   Not on file  Tobacco Use   Smoking status: Every Day    Types: Cigars   Smokeless tobacco: Never  Vaping Use   Vaping status: Never Used  Substance and Sexual Activity   Alcohol use: Not Currently   Drug use: Yes    Types: Marijuana   Sexual activity: Not on file  Other Topics Concern   Not on file  Social History Narrative   Not on file   Social Drivers of Health   Financial Resource Strain: Not on file  Food Insecurity: Unknown (07/14/2023)   Hunger Vital Sign    Worried About Running Out of Food in the Last Year: Patient unable to answer    Ran Out of Food in the Last Year: Never true  Transportation Needs: No Transportation Needs (07/14/2023)   PRAPARE - Administrator, Civil Service (Medical): No    Lack of Transportation (Non-Medical): No  Physical Activity: Not on file  Stress: Not on file  Social Connections: Not on file   Past Surgical History:  Procedure Laterality Date   ANTERIOR CERVICAL DECOMP/DISCECTOMY FUSION N/A 06/05/2023   Procedure: ANTERIOR CERVICAL DISCECTOMY AND FUSION CERVICAL SIX-SEVEN/CERVICAL SEVEN-THORACIC ONE;  Surgeon: Denyce Flank  C, DO;  Location: [facility];  Service: Neurosurgery;  Laterality: N/A;  3C   APPENDECTOMY     CIRCUMCISION     HIATAL HERNIA REPAIR     HIP SURGERY     at 73 and 58 yo   POSTERIOR CERVICAL FUSION/FORAMINOTOMY N/A 07/12/2023   Procedure: POSTERIOR CERVICAL LAMINECTOMY, INSTRUMENTATION AND FUSION CERVICAL SIX-CERVICAL SEVEN, CERVICAL SEVEN-THORACIC ONE;  Surgeon: Dawley, Colby Daub, DO;  Location: [facility] OR;  Service: Neurosurgery;  Laterality: N/A;   Past Surgical History:  Procedure Laterality Date   ANTERIOR CERVICAL DECOMP/DISCECTOMY FUSION N/A 06/05/2023   Procedure: ANTERIOR CERVICAL DISCECTOMY AND FUSION CERVICAL SIX-SEVEN/CERVICAL SEVEN-THORACIC ONE;  Surgeon: Pincus Bridgeman, DO;  Location: [facility] OR;  Service: Neurosurgery;   Laterality: N/A;  3C   APPENDECTOMY     CIRCUMCISION     HIATAL HERNIA REPAIR     HIP SURGERY     at 60 and 58 yo   POSTERIOR CERVICAL FUSION/FORAMINOTOMY N/A 07/12/2023   Procedure: POSTERIOR CERVICAL LAMINECTOMY, INSTRUMENTATION AND FUSION CERVICAL SIX-CERVICAL SEVEN, CERVICAL SEVEN-THORACIC ONE;  Surgeon: Dawley, Colby Daub, DO;  Location: [facility];  Service: Neurosurgery;  Laterality: N/A;   Past Medical History:  Diagnosis Date   Arthritis    Hip pain    Renal calculi    left   Sepsis (HCC) 06/13/2023   BP 113/76   Pulse 96   Ht 5\' 9"  (1.753 m)   SpO2 96%   BMI 29.25 kg/m   Opioid Risk Score:   Fall Risk Score:  `1  Depression screen [facility] 2/9     11/20/2023    8:56 AM 10/09/2023   10:28 AM 04/05/2023    9:16 AM 03/06/2023    8:45 AM 01/16/2023    1:35 PM  Depression screen PHQ 2/9  Decreased Interest 2 0 3 3 0  Down, Depressed, Hopeless 2 0 3 3 0  PHQ - 2 Score 4 0 6 6 0  Altered sleeping   3 3 3   Tired, decreased energy   3 3 0  Change in appetite   3 2 0  Feeling bad or failure about yourself    0 0 0  Trouble concentrating   3 3 0  Moving slowly or fidgety/restless   3 3 0  Suicidal thoughts   0 0 0  PHQ-9 Score   21 20 3   Difficult doing work/chores   Extremely dIfficult Somewhat difficult Not difficult at all     Review of Systems  Musculoskeletal:        Bilateral hip and legs  Psychiatric/Behavioral:  Positive for dysphoric mood.   All other systems reviewed and are negative.      Objective:   Physical Exam   PE: Constitution: Appropriate appearance for age. No apparent distress   Resp: No respiratory distress. No accessory muscle usage. on RA Cardio: Well perfused appearance. No peripheral edema. Abdomen: Nondistended. Nontender.   Psych: Appropriate mood and affect. Neuro: AAOx4. No apparent cognitive deficits   Neurologic Exam:   BLE- HF 1/5 B/L; KE 2/5- DF and PF 2/5 B/L- unchanged   Neuro: goes into severe spams/extensor tone with any ROM-   MAS of 4 LLE HF, KF, adductors; Mas 1+ PF RLE MAS 3  HF, Mas 2 KF, MAS 4 adductors, Mas 1+ PF      Assessment & Plan:   Leon Rodriguez is a 58 y.o. year old male  who  has a past medical history of Arthritis, Hip pain,  Renal calculi, and Sepsis (HCC) (06/13/2023).    They are presenting to PM&R clinic for follow up related to lower extremity spasticity s/p  s/p ACDF and PCDF C6-T1 by Dr Julane Ny. Referred by Dr Lovorn for botox  and phenol neurolysis--performed 10/09/23.   Spasticity Per our discussion with Dr. Raynaldo Call today, we are increasing dantrolene  to 50 mg 3 times daily for a week, then to 100 mg three times daily. You will follow up with her for medications as scheduled.  Follow up with me for Botox  (or xeomin if covered by insurance) in July and 6 weeks later to assess response to injection; we will increase the dose for the right adductors and left adductors, knee flexors, and hip flexors.   Per discussion with Dr Raynaldo Call, patient is not a baclofen  pump candidate. We will look into a referral for imaging guided neurolysis and repeeat botox  once; if no improvements after that, we will stop these interventions   Quadriplegia (HCC) Cervical myelopathy (HCC) I will inquire with your insurance regarding denials of further PT through [facility]; I will give you a call when I hear an answer back on this.  It is up to you whether to repeat hip steroid injections   Other orders -     Dantrolene  Sodium; Take 2 capsules (50 mg total) by mouth 3 (three) times daily for 7 days, THEN 4 capsules (100 mg total) 3 (three) times daily for 23 days.  Dispense: 318 capsule; Refill: 0

## 2023-11-21 ENCOUNTER — Other Ambulatory Visit (HOSPITAL_COMMUNITY): Payer: Self-pay

## 2023-11-25 ENCOUNTER — Other Ambulatory Visit (HOSPITAL_COMMUNITY): Payer: Self-pay

## 2023-11-25 ENCOUNTER — Encounter (HOSPITAL_BASED_OUTPATIENT_CLINIC_OR_DEPARTMENT_OTHER): Attending: General Surgery | Admitting: General Surgery

## 2023-11-25 DIAGNOSIS — L89624 Pressure ulcer of left heel, stage 4: Secondary | ICD-10-CM | POA: Diagnosis present

## 2023-11-25 DIAGNOSIS — L89613 Pressure ulcer of right heel, stage 3: Secondary | ICD-10-CM | POA: Insufficient documentation

## 2023-11-25 DIAGNOSIS — G8250 Quadriplegia, unspecified: Secondary | ICD-10-CM | POA: Diagnosis not present

## 2023-11-26 ENCOUNTER — Other Ambulatory Visit: Payer: Self-pay

## 2023-11-26 ENCOUNTER — Other Ambulatory Visit (HOSPITAL_COMMUNITY): Payer: Self-pay

## 2023-11-26 MED ORDER — TIZANIDINE HCL 2 MG PO TABS
2.0000 mg | ORAL_TABLET | Freq: Three times a day (TID) | ORAL | 2 refills | Status: DC
  Filled 2023-11-26: qty 90, 30d supply, fill #0
  Filled 2023-12-24 (×2): qty 90, 30d supply, fill #1
  Filled 2024-01-21: qty 90, 30d supply, fill #2

## 2023-11-30 ENCOUNTER — Other Ambulatory Visit (HOSPITAL_COMMUNITY): Payer: Self-pay

## 2023-12-02 ENCOUNTER — Ambulatory Visit (HOSPITAL_BASED_OUTPATIENT_CLINIC_OR_DEPARTMENT_OTHER): Admitting: General Surgery

## 2023-12-04 ENCOUNTER — Encounter: Payer: Self-pay | Admitting: Physical Medicine and Rehabilitation

## 2023-12-04 ENCOUNTER — Encounter: Attending: Physical Medicine and Rehabilitation | Admitting: Physical Medicine and Rehabilitation

## 2023-12-04 ENCOUNTER — Other Ambulatory Visit: Payer: Self-pay

## 2023-12-04 VITALS — BP 105/69 | HR 103 | Ht 69.0 in | Wt 200.0 lb

## 2023-12-04 DIAGNOSIS — M25552 Pain in left hip: Secondary | ICD-10-CM | POA: Insufficient documentation

## 2023-12-04 DIAGNOSIS — G959 Disease of spinal cord, unspecified: Secondary | ICD-10-CM | POA: Diagnosis present

## 2023-12-04 DIAGNOSIS — N319 Neuromuscular dysfunction of bladder, unspecified: Secondary | ICD-10-CM | POA: Diagnosis present

## 2023-12-04 DIAGNOSIS — R252 Cramp and spasm: Secondary | ICD-10-CM | POA: Insufficient documentation

## 2023-12-04 DIAGNOSIS — M25551 Pain in right hip: Secondary | ICD-10-CM | POA: Diagnosis present

## 2023-12-04 DIAGNOSIS — G8928 Other chronic postprocedural pain: Secondary | ICD-10-CM | POA: Insufficient documentation

## 2023-12-04 DIAGNOSIS — G8250 Quadriplegia, unspecified: Secondary | ICD-10-CM | POA: Insufficient documentation

## 2023-12-04 MED ORDER — DIAZEPAM 10 MG PO TABS
5.0000 mg | ORAL_TABLET | Freq: Four times a day (QID) | ORAL | 5 refills | Status: DC | PRN
  Filled 2023-12-04: qty 120, 30d supply, fill #0
  Filled 2024-02-06: qty 120, 30d supply, fill #1

## 2023-12-04 MED ORDER — LIDOCAINE 5 % EX PTCH
2.0000 | MEDICATED_PATCH | CUTANEOUS | 5 refills | Status: DC
  Filled 2023-12-04: qty 60, 30d supply, fill #0
  Filled 2023-12-30: qty 60, 30d supply, fill #1
  Filled 2024-01-30: qty 60, 30d supply, fill #2
  Filled 2024-03-16 (×3): qty 60, 30d supply, fill #3
  Filled 2024-04-15 (×2): qty 60, 30d supply, fill #4
  Filled 2024-05-19 (×2): qty 60, 30d supply, fill #5

## 2023-12-04 MED ORDER — MORPHINE SULFATE 15 MG PO TABS
15.0000 mg | ORAL_TABLET | Freq: Four times a day (QID) | ORAL | 0 refills | Status: DC | PRN
  Filled 2023-12-04: qty 60, 15d supply, fill #0

## 2023-12-04 MED ORDER — DANTROLENE SODIUM 100 MG PO CAPS
100.0000 mg | ORAL_CAPSULE | Freq: Three times a day (TID) | ORAL | 5 refills | Status: DC
  Filled 2023-12-04: qty 90, 30d supply, fill #0
  Filled 2024-01-13: qty 90, 30d supply, fill #1
  Filled 2024-02-10: qty 90, 30d supply, fill #2
  Filled 2024-02-25 – 2024-03-16 (×2): qty 90, 30d supply, fill #3

## 2023-12-04 NOTE — Progress Notes (Signed)
 Subjective:    Patient ID: Leon Rodriguez, male    DOB: [birth year], 58 y.o.   MRN: [id number]  HPI Pt is a 58 yr old male with hx of severe cervical myelopathy s/p ACDF and PCDF C6-T1 by Dr Julane Ny- in January 2025 with severe uncontrolled spasticity as well as neurogenic bowel and bladder   Here for  f/u from [facility] for SCI  Dantrolene  has made no difference at all-     Still on MS Contin  15 mg 2x/day Dantrolene  100 mg TID Baclofen  40 mg QID   They denied PT-  Insurance did- but doesn't think got 16 visits for therapy. .   Was doing H/H-    Heel is healed so being d/c;d wound care Pain Inventory Average Pain 10 Pain Right Now 10 My pain is constant, sharp, stabbing, and aching  In the last 24 hours, has pain interfered with the following? General activity 10 Relation with others 10 Enjoyment of life 10 What TIME of day is your pain at its worst? morning , daytime, evening, night, and varies Sleep (in general) Poor  Pain is worse with: sitting and inactivity Pain improves with: heat/ice and medication Relief from Meds: 0  No family history on file. Social History   Socioeconomic History   Marital status: Married    Spouse name: Not on file   Number of children: Not on file   Years of education: Not on file   Highest education level: Not on file  Occupational History   Not on file  Tobacco Use   Smoking status: Every Day    Types: Cigars   Smokeless tobacco: Never  Vaping Use   Vaping status: Never Used  Substance and Sexual Activity   Alcohol use: Not Currently   Drug use: Yes    Types: Marijuana   Sexual activity: Not on file  Other Topics Concern   Not on file  Social History Narrative   Not on file   Social Drivers of Health   Financial Resource Strain: Not on file  Food Insecurity: Unknown (07/14/2023)   Hunger Vital Sign    Worried About Running Out of Food in the Last Year: Patient unable to answer    Ran Out of Food in the Last Year: Never  true  Transportation Needs: No Transportation Needs (07/14/2023)   PRAPARE - Administrator, Civil Service (Medical): No    Lack of Transportation (Non-Medical): No  Physical Activity: Not on file  Stress: Not on file  Social Connections: Not on file   Past Surgical History:  Procedure Laterality Date   ANTERIOR CERVICAL DECOMP/DISCECTOMY FUSION N/A 06/05/2023   Procedure: ANTERIOR CERVICAL DISCECTOMY AND FUSION CERVICAL SIX-SEVEN/CERVICAL SEVEN-THORACIC ONE;  Surgeon: Pincus Bridgeman, DO;  Location: [facility];  Service: Neurosurgery;  Laterality: N/A;  3C   APPENDECTOMY     CIRCUMCISION     HIATAL HERNIA REPAIR     HIP SURGERY     at 22 and 58 yo   POSTERIOR CERVICAL FUSION/FORAMINOTOMY N/A 07/12/2023   Procedure: POSTERIOR CERVICAL LAMINECTOMY, INSTRUMENTATION AND FUSION CERVICAL SIX-CERVICAL SEVEN, CERVICAL SEVEN-THORACIC ONE;  Surgeon: Dawley, Colby Daub, DO;  Location: [facility];  Service: Neurosurgery;  Laterality: N/A;   Past Surgical History:  Procedure Laterality Date   ANTERIOR CERVICAL DECOMP/DISCECTOMY FUSION N/A 06/05/2023   Procedure: ANTERIOR CERVICAL DISCECTOMY AND FUSION CERVICAL SIX-SEVEN/CERVICAL SEVEN-THORACIC ONE;  Surgeon: Pincus Bridgeman, DO;  Location: [facility];  Service: Neurosurgery;  Laterality: N/A;  3C   APPENDECTOMY     CIRCUMCISION     HIATAL HERNIA REPAIR     HIP SURGERY     at 81 and 58 yo   POSTERIOR CERVICAL FUSION/FORAMINOTOMY N/A 07/12/2023   Procedure: POSTERIOR CERVICAL LAMINECTOMY, INSTRUMENTATION AND FUSION CERVICAL SIX-CERVICAL SEVEN, CERVICAL SEVEN-THORACIC ONE;  Surgeon: Dawley, Colby Daub, DO;  Location: [facility];  Service: Neurosurgery;  Laterality: N/A;   Past Medical History:  Diagnosis Date   Arthritis    Hip pain    Renal calculi    left   Sepsis (HCC) 06/13/2023   BP 105/69 (BP Location: Right Arm, Patient Position: Sitting)   Pulse (!) 103   Ht 5' 9 (1.753 m)   Wt 200 lb (90.7 kg)   SpO2 95%   BMI 29.53 kg/m   Opioid Risk Score:    Fall Risk Score:  `1  Depression screen [facility] 2/9     12/04/2023   10:02 AM 12/04/2023   10:00 AM 11/20/2023    8:56 AM 10/09/2023   10:28 AM 04/05/2023    9:16 AM 03/06/2023    8:45 AM 01/16/2023    1:35 PM  Depression screen PHQ 2/9  Decreased Interest 3 0 2 0 3 3 0  Down, Depressed, Hopeless 3 0 2 0 3 3 0  PHQ - 2 Score 6 0 4 0 6 6 0  Altered sleeping 1    3 3 3   Tired, decreased energy 3    3 3  0  Change in appetite 0    3 2 0  Feeling bad or failure about yourself  0    0 0 0  Trouble concentrating 0    3 3 0  Moving slowly or fidgety/restless 0    3 3 0  Suicidal thoughts 0    0 0 0  PHQ-9 Score 10    21 20 3   Difficult doing work/chores Somewhat difficult    Extremely dIfficult Somewhat difficult Not difficult at all    Review of Systems  All other systems reviewed and are negative.      Objective:   Physical Exam        Assessment & Plan:   Pt is a 58 yr old male with hx of severe cervical myelopathy s/p ACDF and PCDF C6-T1 by Dr Julane Ny- in January 2025 with severe uncontrolled spasticity as well as neurogenic bowel and bladder   Here for  f/u from CIR for SCI   Cannot increase most of oral meds due to sedation  2.  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 -   3. Has appt with Dr Dorn Gaskins for Botox Rhunette Cha- 7/17.   4.  Will increase valium   to 5-10 mg 4x day as needed- for severe spasticity.    5. Went over that Duloxetine , and Flomax  are the meds that can dry him out.    6. Will call Dr Beatris Lincoln about lipamotosis   7. Therapy outpt ordered PT- at [facility] Can call them to make appt.    8. F/U in 3 months double appt- SCI  9. Placed referral to [facility] per pt request for pain mgmt will stay here for SCI mgmt.     I spent a total of 30   minutes on total care today- >50% coordination of care- due to d/w pt about pain mgmt- surgery possibly for ITB pump which is ideal- we also d/w pt about Dorsal rhizotomy- which I am not sure  could get anyone to do. As well as Neuronotomy- he doesn't want this- wants to be able to use legs better, not worse. He had to leave before we could have longer discussion

## 2023-12-04 NOTE — Patient Instructions (Signed)
 Pt is a 58 yr old male with hx of severe cervical myelopathy s/p ACDF and PCDF C6-T1 by Dr Julane Ny- in January 2025 with severe uncontrolled spasticity as well as neurogenic bowel and bladder   Here for  f/u from [facility] for SCI   Cannot increase most of oral meds due to sedation  2.  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 -   3. Has appt with Dr Dorn Gaskins for Botox Rhunette Cha- 7/17.   4.  Will increase valium   to 5-10 mg 4x day as needed- for severe spasticity.    5. Went over that Duloxetine , and Flomax  are the meds that can dry him out.    6. Will call Dr Beatris Lincoln about lipamotosis   7. Therapy outpt ordered PT- at [facility] Can call them to make appt.    8. F/U in 3 months double appt- SCI  9. Placed referral to [facility] per pt request for pain mgmt will stay here for SCI mgmt.

## 2023-12-05 ENCOUNTER — Other Ambulatory Visit: Payer: Self-pay

## 2023-12-05 ENCOUNTER — Other Ambulatory Visit (HOSPITAL_COMMUNITY): Payer: Self-pay

## 2023-12-09 ENCOUNTER — Ambulatory Visit (HOSPITAL_BASED_OUTPATIENT_CLINIC_OR_DEPARTMENT_OTHER): Admitting: General Surgery

## 2023-12-14 ENCOUNTER — Other Ambulatory Visit (HOSPITAL_COMMUNITY): Payer: Self-pay

## 2023-12-19 ENCOUNTER — Other Ambulatory Visit (HOSPITAL_COMMUNITY): Payer: Self-pay

## 2023-12-24 ENCOUNTER — Other Ambulatory Visit (HOSPITAL_COMMUNITY): Payer: Self-pay

## 2023-12-25 ENCOUNTER — Ambulatory Visit: Admitting: Physical Therapy

## 2023-12-26 ENCOUNTER — Other Ambulatory Visit (HOSPITAL_COMMUNITY): Payer: Self-pay

## 2023-12-28 ENCOUNTER — Other Ambulatory Visit (HOSPITAL_COMMUNITY): Payer: Self-pay

## 2023-12-30 ENCOUNTER — Telehealth: Payer: Self-pay

## 2023-12-30 ENCOUNTER — Other Ambulatory Visit (HOSPITAL_COMMUNITY): Payer: Self-pay

## 2023-12-30 DIAGNOSIS — G8250 Quadriplegia, unspecified: Secondary | ICD-10-CM

## 2023-12-30 DIAGNOSIS — R252 Cramp and spasm: Secondary | ICD-10-CM

## 2023-12-30 MED ORDER — ASCORBIC ACID 1000 MG PO TABS
1000.0000 mg | ORAL_TABLET | Freq: Every day | ORAL | 3 refills | Status: AC
  Filled 2023-12-30: qty 30, 30d supply, fill #0

## 2023-12-30 MED ORDER — TAMSULOSIN HCL 0.4 MG PO CAPS
0.4000 mg | ORAL_CAPSULE | Freq: Every evening | ORAL | 2 refills | Status: DC
  Filled 2023-12-30: qty 30, 30d supply, fill #0
  Filled 2024-01-24 – 2024-01-28 (×2): qty 30, 30d supply, fill #1
  Filled 2024-02-25: qty 30, 30d supply, fill #2

## 2023-12-30 NOTE — Telephone Encounter (Signed)
 Patient requesting refills of Tamsulosin  & Vit C. Please advise.

## 2024-01-02 ENCOUNTER — Other Ambulatory Visit (HOSPITAL_COMMUNITY): Payer: Self-pay

## 2024-01-02 MED ORDER — MORPHINE SULFATE 15 MG PO TABS
15.0000 mg | ORAL_TABLET | Freq: Four times a day (QID) | ORAL | 0 refills | Status: DC | PRN
  Filled 2024-01-02 – 2024-01-31 (×3): qty 90, 23d supply, fill #0

## 2024-01-02 NOTE — Addendum Note (Signed)
 Addended by: Lenon Kuennen on: 01/02/2024 01:37 PM   Modules accepted: Orders

## 2024-01-02 NOTE — Telephone Encounter (Signed)
 Sent in refill for MSIR 15 mg- and increased dose to 90 tabs/month to try and help pain- also let partner know I sent in referral to Dr Vona an academic [facility] at [facility] for possible alison if he can end up getting ITB pump?  She will call me if she doesn't hear from him in the next week

## 2024-01-08 ENCOUNTER — Encounter: Attending: Physical Medicine and Rehabilitation | Admitting: Physical Medicine and Rehabilitation

## 2024-01-08 ENCOUNTER — Encounter: Payer: Self-pay | Admitting: Physical Medicine and Rehabilitation

## 2024-01-08 VITALS — BP 114/75 | HR 92 | Ht 69.0 in

## 2024-01-08 DIAGNOSIS — M25551 Pain in right hip: Secondary | ICD-10-CM | POA: Insufficient documentation

## 2024-01-08 DIAGNOSIS — M25552 Pain in left hip: Secondary | ICD-10-CM | POA: Insufficient documentation

## 2024-01-08 DIAGNOSIS — G8250 Quadriplegia, unspecified: Secondary | ICD-10-CM | POA: Diagnosis not present

## 2024-01-08 DIAGNOSIS — R252 Cramp and spasm: Secondary | ICD-10-CM | POA: Insufficient documentation

## 2024-01-08 MED ORDER — ONABOTULINUMTOXINA 100 UNITS IJ SOLR
700.0000 [IU] | Freq: Once | INTRAMUSCULAR | Status: AC
  Administered 2024-01-08: 700 [IU] via INTRAMUSCULAR

## 2024-01-08 MED ORDER — SODIUM CHLORIDE (PF) 0.9 % IJ SOLN
7.0000 mL | Freq: Once | INTRAMUSCULAR | Status: AC
  Administered 2024-01-08: 7 mL

## 2024-01-08 NOTE — Patient Instructions (Signed)
-   Resume Usual Activities. Notify Physician of any unusual bleeding, erythema or concern for side effects as reviewed above. - Apply ice prn for pain - Tylenol  prn for pain - Follow up in 2 weeks to assess response to injection  and for repeat phenol injections

## 2024-01-08 NOTE — Progress Notes (Signed)
 Leon Rodriguez is a 58 y.o. year old male  who  has a past medical history of Arthritis, Hip pain, Renal calculi, and Sepsis (HCC) (06/13/2023).   They are presenting to PM&R clinic for follow up related to bilateral lower extremity spasticity .  Plan from last visit: We will increase the dose for the right adductors and left adductors, knee flexors, and hip flexors.   Exam: BLE- HF 1/5 B/L; KE 2/5- DF and PF 2/5 B/L- unchanged   Neuro: goes into severe spasms LLE HF, KF, adduction with any ROM-  LLE: MAS of 3-4 HF, KF, adductors; Mas 2 PF RLE MAS 2 HF, KF, adductors, Mas 1-2 PF--improved     Botolulinum Toxin Injection: [x ] BOTOX  (onabotulinumtoxinA ) [_] DYSPORT (abobotulinumtoxinA) [_] Xeomin (Incobotulinum toxin A)   Goals with treatment: [x ] Decrease spasms/ abnormal movements [x ] Improve Active / Passive ROM [x ] Improve ADLs [ x] Improve functional mobility [ ]  Improve gait mechanics [x ] Improve positioning/posture [x ] Prevent contracture  [x ] Prevent joint destruction [ x] Prevent skin breakdown [ x] Decrease caregiver burden [ x] Improve hygiene [ x] Improve Pain   MEDICATION:  ONAbotulinum toxin. 700 Units     NaCl 4 ml   CONSENT: Obtained in writing followed by time-out per policy. Consent uploaded to chart.   Benefits discussed included, but were not limited to, decreased muscle tightness and spasticity, increased joint range of motion, improved limb positioning and facilitation of hygiene and nursing care.    Risks discussed included, but were not limited to, pain and discomfort, bleeding, bruising, excessive weakness, venous thrombosis, muscle atrophy, and distant spread of toxin which could include generalized muscle weakness, diplopia, blurred vision, ptosis, dysphagia, dysphonia, dysarthria, urinary incontinence and breathing difficulties. These symptoms have been reported hours to weeks after injection. Swallowing and breathing difficulties may be  life threatening, and there have been reports of death. Patient/Family member/Guardian/Caregiver have been offered botulinum toxin informational material upon initial consultation and this information has been continually available. All questions answered to patient/family member/guardian/ caregiver satisfaction. They would like to proceed with procedure. There are no noted contraindications to procedure.   PROCEDURE [x]  Without Ultrasound: Patient was placed in a position with the appropriate muscles exposed, located and identified. The skin was cleaned with ChloraPrep and ethyl chloride was sprayed for topical anesthetic. Using combination EMG amplification and electrical stimulation, the following muscles were identified using anatomical landmarks described by Perotto, et al (1994) and injected following aspiration to ensure blood vessels were avoided.   [_ ] With Ultrasound: Patient was placed in a position with the appropriate muscles exposed, located and identified. The skin was cleaned with ChloraPrep and ethyl chloride was sprayed for topical anesthetic. Using combination Ultrasound guidance for anatomical guidance, avoidance of significant vasculature, to decrease the risk of hematoma formation and ensure botox  was placed in the correct location; EMG amplification and electrical stimulation, the following muscles were identified and injected following aspiration to ensure blood vessels were avoided. A _linear transducer was used during the procedure. The botulinum toxin was visualized entering the appropriate musculature.    MUSCLE: Units /Sites 425 U LLE 50 U psoas  -> 100 U 100 U hamstrings - 50 medial, 50 lateral, 2 sites ea -> 150 (75u/75u) 50 U adductors --> 150 U (50 ea magnus/brevis/longus) Add L rectus femoris - 25 U   275 U RLE 50 U psoas -> 75 U 100 U hamstrings - 50 medial, 50 lateral,  2 sites ea --> 130 U (65/65) 50 U adductors --> 70 U (30 longus, 40 magnus)   700 units were  injected without difficulty. No complications were encountered. The patient tolerated the procedure well. Wasted 0  PLAN: - Resume Usual Activities. Notify Physician of any unusual bleeding, erythema or concern for side effects as reviewed above. - Apply ice prn for pain - Tylenol  prn for pain - Follow up in 2 weeks to assess response to injection  and get phenol neurolysis BL obturator

## 2024-01-13 ENCOUNTER — Other Ambulatory Visit (HOSPITAL_COMMUNITY): Payer: Self-pay

## 2024-01-14 ENCOUNTER — Other Ambulatory Visit: Payer: Self-pay

## 2024-01-14 ENCOUNTER — Telehealth: Payer: Self-pay

## 2024-01-14 ENCOUNTER — Other Ambulatory Visit (HOSPITAL_COMMUNITY): Payer: Self-pay

## 2024-01-14 ENCOUNTER — Encounter: Payer: Self-pay | Admitting: Physical Therapy

## 2024-01-14 ENCOUNTER — Ambulatory Visit: Attending: Physical Medicine and Rehabilitation | Admitting: Physical Therapy

## 2024-01-14 DIAGNOSIS — R2681 Unsteadiness on feet: Secondary | ICD-10-CM | POA: Insufficient documentation

## 2024-01-14 DIAGNOSIS — M6281 Muscle weakness (generalized): Secondary | ICD-10-CM | POA: Insufficient documentation

## 2024-01-14 DIAGNOSIS — G8250 Quadriplegia, unspecified: Secondary | ICD-10-CM | POA: Diagnosis not present

## 2024-01-14 DIAGNOSIS — R2689 Other abnormalities of gait and mobility: Secondary | ICD-10-CM | POA: Diagnosis present

## 2024-01-14 NOTE — Therapy (Signed)
 OUTPATIENT PHYSICAL THERAPY NEURO EVALUATION   Patient Name: Leon Rodriguez MRN: [id number] DOB:[birth year], 58 y.o., male Today's Date: 01/14/2024   PCP: Feliciano Barter MD  REFERRING PROVIDER: Cornelio Bouchard MD   END OF SESSION:  PT End of Session - 01/14/24 1321     Visit Number 1    Number of Visits 25    Date for PT Re-Evaluation 04/07/24    Authorization Type [employer]    Authorization Time Period 01/14/24 to 04/07/24    PT Start Time 1146    PT Stop Time 1225    PT Time Calculation (min) 39 min    Activity Tolerance Patient tolerated treatment well    Behavior During Therapy [employer] for tasks assessed/performed;Anxious          Past Medical History:  Diagnosis Date   Arthritis    Hip pain    Renal calculi    left   Sepsis (HCC) 06/13/2023   Past Surgical History:  Procedure Laterality Date   ANTERIOR CERVICAL DECOMP/DISCECTOMY FUSION N/A 06/05/2023   Procedure: ANTERIOR CERVICAL DISCECTOMY AND FUSION CERVICAL SIX-SEVEN/CERVICAL SEVEN-THORACIC ONE;  Surgeon: Carollee Lani BROCKS, DO;  Location: [facility] OR;  Service: Neurosurgery;  Laterality: N/A;  3C   APPENDECTOMY     CIRCUMCISION     HIATAL HERNIA REPAIR     HIP SURGERY     at 74 and 58 yo   POSTERIOR CERVICAL FUSION/FORAMINOTOMY N/A 07/12/2023   Procedure: POSTERIOR CERVICAL LAMINECTOMY, INSTRUMENTATION AND FUSION CERVICAL SIX-CERVICAL SEVEN, CERVICAL SEVEN-THORACIC ONE;  Surgeon: Dawley, Lani BROCKS, DO;  Location: [facility] OR;  Service: Neurosurgery;  Laterality: N/A;   Patient Active Problem List   Diagnosis Date Noted   Nerve pain 09/04/2023   Quadriplegia (HCC) 09/04/2023   Pressure injury of left foot, stage 4 (HCC) 09/04/2023   Paraplegia (HCC) 07/17/2023   Spasticity 07/12/2023   Adjustment disorder with depressed mood 06/30/2023   Bacteremia 06/16/2023   Neurogenic bladder 06/14/2023   Lumbar canal stenosis 06/14/2023   Lower extremity weakness 06/14/2023   Acute cystitis without hematuria 06/14/2023   Cervical  myelopathy (HCC) 06/07/2023   Cervical spinal stenosis 06/05/2023   Colon cancer screening 03/06/2023   Bilateral hip pain 01/16/2023   Obesity (BMI 30-39.9) 01/16/2023   Polysubstance use disorder 01/16/2023   Right hydrocele 01/07/2023   Epididymo-orchitis 01/06/2023    ONSET DATE: ACDF C6-C7 and C7-T1 05/2023, Posterior cervical laminectomy and fusion C6-C7, C7-T1 07/12/23  REFERRING DIAG:  Diagnosis  G82.50 (ICD-10-CM) - Quadriplegia (HCC)    THERAPY DIAG:  Other abnormalities of gait and mobility  Muscle weakness (generalized)  Unsteadiness on feet  Rationale for Evaluation and Treatment: Rehabilitation  SUBJECTIVE:  SUBJECTIVE STATEMENT:  Neurologist told me my issues were in my neck, had a surgery going in the front in December 2024 then in the back January 2025. Had 2 months of AIR at [facility] Was getting HHPT but insurance put a stop to it, wouldn't approve it anymore. Needs pretty much 100% assist for transfers, using sliding board- does not like hoyer. Has really severe mm spasms in LEs, hips come out of place when this happens; L knee will not flex/bend at all, the R will bend but it is painful.     Pt accompanied by: family member  PERTINENT HISTORY:   See above, also:   Pt is a 58 yr old male with hx of severe cervical myelopathy s/p ACDF and PCDF C6-T1 by Dr Carollee- in January 2025 with severe uncontrolled spasticity as well as neurogenic bowel and bladder  PAIN:  Are you having pain? Yes: NPRS scale: 8/10 Pain location: BLEs from hips down  Pain description: nerve pain but becomes excruciating when hips are used  Aggravating factors: using legs/movement of LEs/hips  Relieving factors: nothing   PRECAUTIONS: Precautions Precautions: Fall;Cervical Precaution Comments:  severe muscle spasms Restrictions Weight Bearing Restrictions Per Provider Order: No  RED FLAGS: None   WEIGHT BEARING RESTRICTIONS: No  FALLS: Has patient fallen in last 6 months? No  LIVING ENVIRONMENT: Lives with: lives with their family Lives in: House/apartment Stairs: No Has following equipment at home: Wheelchair (power), Ramped entry, and hoyer   PLOF: Independent, Independent with basic ADLs, Independent with gait, and Independent with transfers  PATIENT GOALS: address tone in LEs, improve mobility   OBJECTIVE:  Note: Objective measures were completed at Evaluation unless otherwise noted.    COGNITION: Overall cognitive status: Within functional limits for tasks assessed   SENSATION: LEs very tender to touch, very painful- unable to formally assess LTT   COORDINATION: Impaired due to tone     MUSCLE LENGTH:  Severe R LE extensor tone/severe R knee flexion tone as well as ADD tone noted at hips, does have some withdrawal reflexes into hip flexion with high pain levels       LOWER EXTREMITY MMT:    MMT Right Eval Left Eval  Hip flexion    Hip extension    Hip abduction    Hip adduction    Hip internal rotation    Hip external rotation    Knee flexion    Knee extension    Ankle dorsiflexion    Ankle plantarflexion    Ankle inversion    Ankle eversion    (Blank rows = not tested)  Did not assess MMT at eval, time limitations of session   BED MOBILITY:  Findings: Sit to supine Mod A Supine to sit Max A Rolling to Right Modified independence Rolling to Left Max A  TRANSFERS: Chair to chair: Max A  Assistive device utilized: sliding board            PATIENT SURVEYS:  FIST 10/56  TREATMENT DATE:   01/14/24  Eval, POC     PATIENT EDUCATION: Education details: exam findings, POC, benefit of transfer to 3rd  st clinic  Person educated: Patient and Spouse Education method: Explanation Education comprehension: verbalized understanding and returned demonstration  HOME EXERCISE PROGRAM: TBD   GOALS: Goals reviewed with patient? No  SHORT TERM GOALS: Target date: 02/25/2024    Family/patient to be independent in appropriate LE stretching program as tolerated given severe LE pain even with light touches  Baseline: Goal status: INITIAL  2.  Will be able to complete all bed mobility with no more than MinA  Baseline:  Goal status: INITIAL  3.  Will be able to perform sliding board transfers with no more than ModA  and Min cues  Baseline:  Goal status: INITIAL    LONG TERM GOALS: Target date: 04/07/2024    Will tolerate at least 10 min in standing frame without increase in LE pain to help manage tone/promote WB  Baseline:  Goal status: INITIAL  2.  Will be able to complete bed mobility with Mod(I) Baseline:  Goal status: INITIAL  3.  Will be able to perform all sliding board transfers (uphill and downhilll transfers) with no more than MinA  Baseline:  Goal status: INITIAL  4.  BLE tone to have improved by 50% and will not interfere with functional mobility/transfers  Baseline:  Goal status: INITIAL  5.  FIST (function in sitting test) for SCI to have improved by at least 10 points  Baseline:  Goal status: INITIAL    ASSESSMENT:  CLINICAL IMPRESSION: Patient is a 58 y.o. M who was seen today for physical therapy evaluation and treatment for  Diagnosis  G82.50 (ICD-10-CM) - Quadriplegia (HCC)  . He does have a complex surgical history (see above) and received skilled AIR services in [place] and HHPT prior to coming to [facility]. Eval was very limited due to severe tone and pain in BLEs- he did receive botox  about 5 days ago so medicine had not set in yet at time of eval. I really think he would benefit a lot more from getting skilled PT services at [facility] 3rd st neuro clinic  due to the complexity of his medical history and the equipment available at that clinic, had our front desk facilitate transfer.   OBJECTIVE IMPAIRMENTS: decreased activity tolerance, decreased balance, decreased coordination, decreased mobility, difficulty walking, decreased ROM, decreased strength, hypomobility, increased edema, increased fascial restrictions, impaired sensation, impaired tone, and pain.   ACTIVITY LIMITATIONS: sitting, standing, transfers, bed mobility, and locomotion level  PARTICIPATION LIMITATIONS: driving, shopping, community activity, occupation, and yard work  PERSONAL FACTORS: Age, Education, Fitness, Past/current experiences, Social background, and Time since onset of injury/illness/exacerbation are also affecting patient's functional outcome.   REHAB POTENTIAL: Fair severity of condition, complex medical/surgical history   CLINICAL DECISION MAKING: Unstable/unpredictable  EVALUATION COMPLEXITY: High  PLAN:  PT FREQUENCY: 2x/week  PT DURATION: 12 weeks  PLANNED INTERVENTIONS: 97750- Physical Performance Testing, 97110-Therapeutic exercises, 97530- Therapeutic activity, [id number]- Neuromuscular re-education, 97535- Self Care, 02859- Manual therapy, [id number]- Gait training, and [id number]- Electrical stimulation (manual)  PLAN FOR NEXT SESSION: transfer to 3rd st due to equipment and SCI neuro specialists. May benefit from OT, will need to message Dr. Cornelio Josette Rough, PT, DPT 01/14/24 1:26 PM

## 2024-01-14 NOTE — Telephone Encounter (Signed)
 [facility] does not have the Morphine  in stock. Ask patient to call to get alternate medication.

## 2024-01-16 ENCOUNTER — Other Ambulatory Visit (HOSPITAL_COMMUNITY): Payer: Self-pay

## 2024-01-16 ENCOUNTER — Telehealth: Payer: Self-pay | Admitting: Physical Medicine and Rehabilitation

## 2024-01-16 ENCOUNTER — Other Ambulatory Visit: Payer: Self-pay | Admitting: Physical Medicine and Rehabilitation

## 2024-01-16 ENCOUNTER — Other Ambulatory Visit: Payer: Self-pay

## 2024-01-16 MED ORDER — BACLOFEN 20 MG PO TABS
40.0000 mg | ORAL_TABLET | Freq: Four times a day (QID) | ORAL | 5 refills | Status: DC
  Filled 2024-01-16: qty 240, 30d supply, fill #0
  Filled 2024-02-18: qty 240, 30d supply, fill #1
  Filled 2024-03-16: qty 240, 30d supply, fill #2
  Filled 2024-04-15: qty 240, 30d supply, fill #3
  Filled 2024-05-04 – 2024-05-19 (×2): qty 240, 30d supply, fill #4

## 2024-01-16 MED ORDER — OXYCODONE HCL 15 MG PO TABS
15.0000 mg | ORAL_TABLET | Freq: Four times a day (QID) | ORAL | 0 refills | Status: AC | PRN
  Filled 2024-01-16 – 2024-01-17 (×3): qty 120, 30d supply, fill #0

## 2024-01-16 NOTE — Telephone Encounter (Signed)
 Please refill baclofen , and something aside from morphine  due to shortage. Send to [facility] pharmacy

## 2024-01-17 ENCOUNTER — Other Ambulatory Visit: Payer: Self-pay

## 2024-01-17 ENCOUNTER — Telehealth (HOSPITAL_COMMUNITY): Payer: Self-pay

## 2024-01-17 ENCOUNTER — Other Ambulatory Visit (HOSPITAL_COMMUNITY): Payer: Self-pay

## 2024-01-17 NOTE — Telephone Encounter (Signed)
 PA request has been Received. New Encounter has been or will be created for follow up. For additional info see Pharmacy Prior Auth telephone encounter from 01/17/24.

## 2024-01-17 NOTE — Telephone Encounter (Signed)
 Pharmacy Patient Advocate Encounter  Received notification from [facility] that Prior Authorization for oxyCODONE  HCl 15MG  tablets  has been APPROVED from 01/17/24 to 07/15/24. Ran test claim, Copay is $4. This test claim was processed through [facility]- copay amounts may vary at other pharmacies due to pharmacy/plan contracts, or as the patient moves through the different stages of their insurance plan.   PA #/Case ID/Reference #: [id number]

## 2024-01-17 NOTE — Telephone Encounter (Signed)
 Pharmacy Patient Advocate Encounter   Received notification from Pt Calls Messages that prior authorization for oxyCODONE  HCl 15MG  tablets  is required/requested.   Insurance verification completed.   The patient is insured through [facility] .   Per test claim: PA required; PA submitted to above mentioned insurance via CoverMyMeds Key/confirmation #/EOC [id number] Status is pending

## 2024-01-20 ENCOUNTER — Encounter: Payer: Self-pay | Admitting: Physical Medicine and Rehabilitation

## 2024-01-20 ENCOUNTER — Other Ambulatory Visit (HOSPITAL_COMMUNITY): Payer: Self-pay

## 2024-01-20 ENCOUNTER — Encounter: Admitting: Physical Medicine and Rehabilitation

## 2024-01-20 VITALS — BP 105/70 | Ht 69.0 in

## 2024-01-20 DIAGNOSIS — M25551 Pain in right hip: Secondary | ICD-10-CM | POA: Diagnosis not present

## 2024-01-20 DIAGNOSIS — M25552 Pain in left hip: Secondary | ICD-10-CM | POA: Diagnosis not present

## 2024-01-20 DIAGNOSIS — R252 Cramp and spasm: Secondary | ICD-10-CM | POA: Diagnosis not present

## 2024-01-20 DIAGNOSIS — G8250 Quadriplegia, unspecified: Secondary | ICD-10-CM

## 2024-01-20 MED ORDER — PHENOL 5% (AQUEOUS) FOR INJECTION
10.0000 mL | Freq: Once | Status: AC
  Administered 2024-01-20: 10 mL

## 2024-01-20 NOTE — Patient Instructions (Signed)
 Call or message me in 2 weeks; if no improvement, we will not schedule a follow up

## 2024-01-20 NOTE — Progress Notes (Unsigned)
  Patient presents for procedure below. 2 weeks post-botox , has noted no appreciable benefit after increased dosing.   Bilateral Obturator nerve neurolysis: [x ] Phenol [_ ] Ethanol  Goals with treatment: [x ] Decrease spasms/ abnormal movements [x ] Improve Active / Passive ROM [ ]  Improve ADLs [ ]  Improve functional mobility [ ]  Improve gait mechanics [x ] Improve positioning/posture [x ] Prevent contracture  [ ]  Prevent joint destruction [x ] Prevent skin breakdown [x ] Decrease caregiver burden [x ] Improve hygiene [x ] Improve Pain  MEDICATION:  Phenol 5% 10 mL.   CONSENT: Obtained in writing followed by time-out per policy. Consent uploaded to chart.  Benefits discussed included, but were not limited to, decreased muscle tightness and spasticity, increased joint range of motion, improved limb positioning and facilitation of hygiene and nursing care.   Risks discussed included, but were not limited to, pain and discomfort, bleeding, bruising, excessive weakness, venous thrombosis, muscle atrophy, and distant spread of neurolytic.  All questions answered to patient/family member/guardian/ caregiver satisfaction. They would like to proceed with procedure. There are no noted contraindications to procedure.  PROCEDURE  Technique:  The pubic tubercle was identified by palpation, and 4 cm was drawn laterally, then 3 cm inferiorly was drawn to locate the area over the obturator foramen. The femoral artery was palpated laterally to confirm adequate distance from the vessel. Then, a 22 gauge, 2 in needle attached to a nerve stimulator was advanced cephalad and laterally at an angle of 45 degrees  until contractions of the thigh adductor muscles are observed with minimal stimulation ( 1 on L , 2 on R ). 5 mL of pre-mixed phenol were injected into the obturator foramen at each site. There were no complications during the procedure and the patient tolerated the injection well with minimal  pain.   Pre-injection adductor tone: MAS 3 RLE, MAS 4 LLE Post-injection adductor tone: MAS 2 RLE, MAS 3-4 LLE  (c/b frequent spasms)  Plan: Call or message me in 2 weeks; if no improvement, we will not schedule a follow up  Joesph JAYSON Likes, DO 01/22/2024

## 2024-01-21 ENCOUNTER — Other Ambulatory Visit (HOSPITAL_COMMUNITY): Payer: Self-pay

## 2024-01-21 ENCOUNTER — Other Ambulatory Visit: Payer: Self-pay

## 2024-01-21 ENCOUNTER — Ambulatory Visit

## 2024-01-21 DIAGNOSIS — M6281 Muscle weakness (generalized): Secondary | ICD-10-CM

## 2024-01-21 DIAGNOSIS — R2681 Unsteadiness on feet: Secondary | ICD-10-CM

## 2024-01-21 DIAGNOSIS — R2689 Other abnormalities of gait and mobility: Secondary | ICD-10-CM

## 2024-01-21 NOTE — Therapy (Signed)
 OUTPATIENT PHYSICAL THERAPY NEURO TREATMENT   Patient Name: AQIL GOETTING MRN: [id number] DOB:12-05-1965, 58 y.o., male Today's Date: 01/21/2024   PCP: Feliciano Barter MD  REFERRING PROVIDER: Cornelio Bouchard MD   END OF SESSION:  PT End of Session - 01/21/24 1257     Visit Number 2    Number of Visits 25    Date for PT Re-Evaluation 04/07/24    Authorization Type [employer]    Authorization Time Period 01/14/24-03/20/24    Authorization - Number of Visits 6    PT Start Time 1315    PT Stop Time 1401    PT Time Calculation (min) 46 min    Equipment Utilized During Treatment Gait belt;Other (comment)   slideboard   Activity Tolerance Patient tolerated treatment well    Behavior During Therapy [facility] for tasks assessed/performed          Past Medical History:  Diagnosis Date   Arthritis    Hip pain    Renal calculi    left   Sepsis (HCC) 06/13/2023   Past Surgical History:  Procedure Laterality Date   ANTERIOR CERVICAL DECOMP/DISCECTOMY FUSION N/A 06/05/2023   Procedure: ANTERIOR CERVICAL DISCECTOMY AND FUSION CERVICAL SIX-SEVEN/CERVICAL SEVEN-THORACIC ONE;  Surgeon: Carollee Lani BROCKS, DO;  Location: [facility] OR;  Service: Neurosurgery;  Laterality: N/A;  3C   APPENDECTOMY     CIRCUMCISION     HIATAL HERNIA REPAIR     HIP SURGERY     at 68 and 58 yo   POSTERIOR CERVICAL FUSION/FORAMINOTOMY N/A 07/12/2023   Procedure: POSTERIOR CERVICAL LAMINECTOMY, INSTRUMENTATION AND FUSION CERVICAL SIX-CERVICAL SEVEN, CERVICAL SEVEN-THORACIC ONE;  Surgeon: Dawley, Lani BROCKS, DO;  Location: [facility] OR;  Service: Neurosurgery;  Laterality: N/A;   Patient Active Problem List   Diagnosis Date Noted   Nerve pain 09/04/2023   Quadriplegia (HCC) 09/04/2023   Pressure injury of left foot, stage 4 (HCC) 09/04/2023   Paraplegia (HCC) 07/17/2023   Spasticity 07/12/2023   Adjustment disorder with depressed mood 06/30/2023   Bacteremia 06/16/2023   Neurogenic bladder 06/14/2023   Lumbar canal stenosis  06/14/2023   Lower extremity weakness 06/14/2023   Acute cystitis without hematuria 06/14/2023   Cervical myelopathy (HCC) 06/07/2023   Cervical spinal stenosis 06/05/2023   Colon cancer screening 03/06/2023   Bilateral hip pain 01/16/2023   Obesity (BMI 30-39.9) 01/16/2023   Polysubstance use disorder 01/16/2023   Right hydrocele 01/07/2023   Epididymo-orchitis 01/06/2023    ONSET DATE: ACDF C6-C7 and C7-T1 05/2023, Posterior cervical laminectomy and fusion C6-C7, C7-T1 07/12/23  REFERRING DIAG:  Diagnosis  G82.50 (ICD-10-CM) - Quadriplegia (HCC)    THERAPY DIAG:  Other abnormalities of gait and mobility  Muscle weakness (generalized)  Unsteadiness on feet  Rationale for Evaluation and Treatment: Rehabilitation  SUBJECTIVE:  SUBJECTIVE STATEMENT: Patient arrives to clinic in [place] and with wife. He did have Phenol to B adductors. Previously had this and it was not helpful. Transfers at home via board. Denies falls. Patient does have an appt on 8/11 to address Lipomatosis to move toward a baclofen  pump.     Pt accompanied by: family member  PERTINENT HISTORY:   See above, also:   Pt is a 58 yr old male with hx of severe cervical myelopathy s/p ACDF and PCDF C6-T1 by Dr Carollee- in January 2025 with severe uncontrolled spasticity as well as neurogenic bowel and bladder  PAIN:  Are you having pain? Yes: NPRS scale: 6-7/10 Pain location: BLEs from hips down  Pain description: nerve pain but becomes excruciating when hips are used  Aggravating factors: using legs/movement of LEs/hips  Relieving factors: nothing   PRECAUTIONS: Precautions Precautions: Fall;Cervical Precaution Comments: severe muscle spasms  PATIENT GOALS: address tone in LEs, improve mobility                                                                                                                                TREATMENT  Theract: -slideboard transfer to therapy mat leading R with MaxA x1 -sit > supine TotalA  -patient demonstrating near flexor-withdrawal pattern with light touch to plantar surface of R LE (L LE withdrawing caudally) -gentle prolonged stretch to B adductors with very mild improvement in PROM L >R -B rolling x4 with greater ease rolling R due to L knee flexion positioning  -Supine > sit with MaxA x2 -Slideboard transfer mat > wc with TotalA x2 -PT appropriately inflated ROHO cells of wc cushion  -education on frequent pressure relief/repositioning as well as need to get out of bed for mental health and physical benefits    PATIENT EDUCATION: Education details: see above, eat at least 1 meal/ day outside of bed to start, importance of intensive stretching with Botox  Person educated: Patient and Spouse Education method: Explanation Education comprehension: verbalized understanding and returned demonstration  HOME EXERCISE PROGRAM: -eat at least 1 meal/ day outside of bed  -intensive stretching to muscles that received botox    GOALS: Goals reviewed with patient? No  SHORT TERM GOALS: Target date: 02/25/2024    Family/patient to be independent in appropriate LE stretching program as tolerated given severe LE pain even with light touches  Baseline: Goal status: INITIAL  2.  Will be able to complete all bed mobility with no more than MinA  Baseline:  Goal status: INITIAL  3.  Will be able to perform sliding board transfers with no more than ModA  and Min cues  Baseline:  Goal status: INITIAL    LONG TERM GOALS: Target date: 04/07/2024    Will tolerate at least 10 min in standing frame without increase in LE pain to help manage tone/promote WB  Baseline:  Goal status: INITIAL  2.  Will be able to complete bed mobility with Mod(I) Baseline:  Goal status:  INITIAL  3.  Will be able to perform all sliding board transfers (uphill and downhilll transfers) with no more than MinA  Baseline:  Goal status: INITIAL  4.  BLE tone to have improved by 50% and will not interfere with functional mobility/transfers  Baseline:  Goal status: INITIAL  5.  FIST (function in sitting test) for SCI to have improved by at least 10 points  Baseline:  Goal status: INITIAL    ASSESSMENT:  CLINICAL IMPRESSION: Patient seen for skilled PT session with emphasis on patient education and bed mobility. He is grossly Max-TotalA for all mobility, including placing a slideboard, completing a downhill transfer, scooting, etc. Educated patient on head/hips relationship to facilitate greater ease of transfer with mild impact. His tone in B LE remains the largest limiting factor. PT emphasizing importance of stretching as botox /phenol alone will not loosens muscles. Discussed importance of getting out of bed to facilitate greater engagement in daily activities, improved mental health, lung/GI/ CV functioning, etc. Patient and wife appreciative of education. PT reached out to wc vendor to inquire about adding a pommel to wc to help reduce progressive adduction of B LE to preserve ease of ability to complete I/O cath independently and minimize strain on B hips. Continue POC.   OBJECTIVE IMPAIRMENTS: decreased activity tolerance, decreased balance, decreased coordination, decreased mobility, difficulty walking, decreased ROM, decreased strength, hypomobility, increased edema, increased fascial restrictions, impaired sensation, impaired tone, and pain.   ACTIVITY LIMITATIONS: sitting, standing, transfers, bed mobility, and locomotion level  PARTICIPATION LIMITATIONS: driving, shopping, community activity, occupation, and yard work  PERSONAL FACTORS: Age, Education, Fitness, Past/current experiences, Social background, and Time since onset of injury/illness/exacerbation are also  affecting patient's functional outcome.   REHAB POTENTIAL: Fair severity of condition, complex medical/surgical history   CLINICAL DECISION MAKING: Unstable/unpredictable  EVALUATION COMPLEXITY: High  PLAN:  PT FREQUENCY: 2x/week  PT DURATION: 12 weeks  PLANNED INTERVENTIONS: 97750- Physical Performance Testing, 97110-Therapeutic exercises, 97530- Therapeutic activity, [id number]- Neuromuscular re-education, 97535- Self Care, 02859- Manual therapy, [phone]- Gait training, and [phone]- Electrical stimulation (manual)  PLAN FOR NEXT SESSION: scooting, head hips relationship, anything to progress toward more independent slideboard transfers to reduce burden of care, will need formal/written INTENSIVE stretching program d/t visit limit, static/dynamic sitting balance (lateral leans in prep for slideboard placement/ supine<> sit transition)  Delon DELENA Pop, PT, DPT, CBIS 01/21/24 4:05 PM

## 2024-01-22 ENCOUNTER — Other Ambulatory Visit (HOSPITAL_COMMUNITY): Payer: Self-pay

## 2024-01-24 ENCOUNTER — Other Ambulatory Visit (HOSPITAL_COMMUNITY): Payer: Self-pay

## 2024-01-28 ENCOUNTER — Other Ambulatory Visit (HOSPITAL_COMMUNITY): Payer: Self-pay

## 2024-01-30 ENCOUNTER — Other Ambulatory Visit (HOSPITAL_COMMUNITY): Payer: Self-pay

## 2024-01-30 ENCOUNTER — Telehealth (HOSPITAL_COMMUNITY): Payer: Self-pay

## 2024-01-31 ENCOUNTER — Telehealth (HOSPITAL_COMMUNITY): Payer: Self-pay

## 2024-01-31 ENCOUNTER — Other Ambulatory Visit (HOSPITAL_COMMUNITY): Payer: Self-pay

## 2024-01-31 ENCOUNTER — Ambulatory Visit

## 2024-01-31 NOTE — Telephone Encounter (Signed)
 Pharmacy Patient Advocate Encounter   Received notification from Pt Calls Messages that prior authorization for Morphine  Sulfate 15MG  tablets  is required/requested.   Insurance verification completed.   The patient is insured through [facility] .   Per test claim: PA required; PA submitted to above mentioned insurance via CoverMyMeds Key/confirmation #/EOC [id number] Status is pending

## 2024-01-31 NOTE — Telephone Encounter (Signed)
 PA request has been Received. New Encounter has been or will be created for follow up. For additional info see Pharmacy Prior Auth telephone encounter from 01/31/24.

## 2024-01-31 NOTE — Telephone Encounter (Signed)
 Pharmacy Patient Advocate Encounter  Received notification from [facility] that Prior Authorization for Morphine  Sulfate 15MG  tablets  has been APPROVED from 01/31/24 to 07/29/24. Ran test claim, Copay is $4. This test claim was processed through [facility]- copay amounts may vary at other pharmacies due to pharmacy/plan contracts, or as the patient moves through the different stages of their insurance plan.   PA #/Case ID/Reference #: [id number]

## 2024-02-05 ENCOUNTER — Ambulatory Visit: Attending: Physical Medicine and Rehabilitation

## 2024-02-05 DIAGNOSIS — R2689 Other abnormalities of gait and mobility: Secondary | ICD-10-CM | POA: Diagnosis present

## 2024-02-05 DIAGNOSIS — R2681 Unsteadiness on feet: Secondary | ICD-10-CM | POA: Insufficient documentation

## 2024-02-05 DIAGNOSIS — M6281 Muscle weakness (generalized): Secondary | ICD-10-CM | POA: Insufficient documentation

## 2024-02-05 DIAGNOSIS — G959 Disease of spinal cord, unspecified: Secondary | ICD-10-CM | POA: Diagnosis present

## 2024-02-05 NOTE — Therapy (Signed)
 OUTPATIENT PHYSICAL THERAPY NEURO TREATMENT   Patient Name: Leon Rodriguez MRN: [id number] DOB:05/07/1966, 58 y.o., male Today's Date: 02/05/2024   PCP: Feliciano Barter MD  REFERRING PROVIDER: Cornelio Bouchard MD   END OF SESSION:  PT End of Session - 02/05/24 1434     Visit Number 3    Number of Visits 25    Date for PT Re-Evaluation 04/07/24    Authorization Type [employer]    Authorization Time Period 01/14/24-03/20/24    Authorization - Number of Visits 6    PT Start Time 1445    PT Stop Time 1529    PT Time Calculation (min) 44 min    Equipment Utilized During Treatment Gait belt;Other (comment)   slideboard   Activity Tolerance Patient tolerated treatment well;Patient limited by pain    Behavior During Therapy Flat affect          Past Medical History:  Diagnosis Date   Arthritis    Hip pain    Renal calculi    left   Sepsis (HCC) 06/13/2023   Past Surgical History:  Procedure Laterality Date   ANTERIOR CERVICAL DECOMP/DISCECTOMY FUSION N/A 06/05/2023   Procedure: ANTERIOR CERVICAL DISCECTOMY AND FUSION CERVICAL SIX-SEVEN/CERVICAL SEVEN-THORACIC ONE;  Surgeon: Carollee Lani BROCKS, DO;  Location: [facility] OR;  Service: Neurosurgery;  Laterality: N/A;  3C   APPENDECTOMY     CIRCUMCISION     HIATAL HERNIA REPAIR     HIP SURGERY     at 66 and 58 yo   POSTERIOR CERVICAL FUSION/FORAMINOTOMY N/A 07/12/2023   Procedure: POSTERIOR CERVICAL LAMINECTOMY, INSTRUMENTATION AND FUSION CERVICAL SIX-CERVICAL SEVEN, CERVICAL SEVEN-THORACIC ONE;  Surgeon: Dawley, Lani BROCKS, DO;  Location: [facility] OR;  Service: Neurosurgery;  Laterality: N/A;   Patient Active Problem List   Diagnosis Date Noted   Nerve pain 09/04/2023   Quadriplegia (HCC) 09/04/2023   Pressure injury of left foot, stage 4 (HCC) 09/04/2023   Paraplegia (HCC) 07/17/2023   Spasticity 07/12/2023   Adjustment disorder with depressed mood 06/30/2023   Bacteremia 06/16/2023   Neurogenic bladder 06/14/2023   Lumbar canal stenosis  06/14/2023   Lower extremity weakness 06/14/2023   Acute cystitis without hematuria 06/14/2023   Cervical myelopathy (HCC) 06/07/2023   Cervical spinal stenosis 06/05/2023   Colon cancer screening 03/06/2023   Bilateral hip pain 01/16/2023   Obesity (BMI 30-39.9) 01/16/2023   Polysubstance use disorder 01/16/2023   Right hydrocele 01/07/2023   Epididymo-orchitis 01/06/2023    ONSET DATE: ACDF C6-C7 and C7-T1 05/2023, Posterior cervical laminectomy and fusion C6-C7, C7-T1 07/12/23  REFERRING DIAG:  Diagnosis  G82.50 (ICD-10-CM) - Quadriplegia (HCC)    THERAPY DIAG:  Other abnormalities of gait and mobility  Muscle weakness (generalized)  Unsteadiness on feet  Rationale for Evaluation and Treatment: Rehabilitation  SUBJECTIVE:  SUBJECTIVE STATEMENT: Patient arrives to clinic in pwc with LLE even more adducted than previous appt. States they haven't been stretching due to the pain. Denies falls.     Pt accompanied by: family member  PERTINENT HISTORY:   See above, also:   Pt is a 58 yr old male with hx of severe cervical myelopathy s/p ACDF and PCDF C6-T1 by Dr Carollee- in January 2025 with severe uncontrolled spasticity as well as neurogenic bowel and bladder  PAIN:  Are you having pain? Yes: NPRS scale: 6-7/10 Pain location: BLEs from hips down  Pain description: nerve pain but becomes excruciating when hips are used  Aggravating factors: using legs/movement of LEs/hips  Relieving factors: nothing   PRECAUTIONS: Precautions Precautions: Fall;Cervical Precaution Comments: severe muscle spasms  PATIENT GOALS: address tone in LEs, improve mobility                                                                                                                               TREATMENT   Theract: -discussed cushion pommel vs. Leg straps  -patient preferring pommel  -SBT leading R with MaxA x2 down incline -sit > supine MaxA x1 -supine L LE adducted to 25*   RLE adducted to 37* -windswept for wc adjustments  L trunk lateral, R hip lateral? + pommel  -supine > sit MaxA -prolonged stretch to B adductors  -lateral leans in sitting   PATIENT EDUCATION: Education details: see above, eat at least 1 meal/ day outside of bed to start, importance of intensive stretching with Botox , wc adjustments, sit EOB with assist/spv  Person educated: Patient and Spouse Education method: Explanation Education comprehension: verbalized understanding and returned demonstration  HOME EXERCISE PROGRAM: -eat at least 1 meal/ day outside of bed  -intensive stretching to muscles that received botox   -sit EOB for at least 2 mins/ days for static sitting balance practice with spv/assist  GOALS: Goals reviewed with patient? No  SHORT TERM GOALS: Target date: 02/25/2024    Family/patient to be independent in appropriate LE stretching program as tolerated given severe LE pain even with light touches  Baseline: Goal status: INITIAL  2.  Will be able to complete all bed mobility with no more than MinA  Baseline:  Goal status: INITIAL  3.  Will be able to perform sliding board transfers with no more than ModA  and Min cues  Baseline:  Goal status: INITIAL    LONG TERM GOALS: Target date: 04/07/2024    Will tolerate at least 10 min in standing frame without increase in LE pain to help manage tone/promote WB  Baseline:  Goal status: INITIAL  2.  Will be able to complete bed mobility with Mod(I) Baseline:  Goal status: INITIAL  3.  Will be able to perform all sliding board transfers (uphill and downhilll transfers) with no more than MinA  Baseline:  Goal status: INITIAL  4.  BLE tone to have improved by 50% and  will not interfere with functional mobility/transfers  Baseline:   Goal status: INITIAL  5.  FIST (function in sitting test) for SCI to have improved by at least 10 points  Baseline:  Goal status: INITIAL    ASSESSMENT:  CLINICAL IMPRESSION: Patient seen for skilled PT session with emphasis on patient education, transfers and stretching. His B adductor tone has increased to the point where his posture is more windswept. He likely needs, not only a pommel to his wc, but also a L trunk lateral and R hip lateral to prevent further deterioration of his posture. Static sittig balance significantly impaired by this posture necessitating B UE use on EOM. B LE spasms continue to cause a significant amount of pain and increase in intensity with any position changes. He reports that he has not stretched B adductors in the past 2 weeks (since last visit) due to pain. PT emphasized importance of stretching, otherwise Botox  is fruitless. Continue POC.   OBJECTIVE IMPAIRMENTS: decreased activity tolerance, decreased balance, decreased coordination, decreased mobility, difficulty walking, decreased ROM, decreased strength, hypomobility, increased edema, increased fascial restrictions, impaired sensation, impaired tone, and pain.   ACTIVITY LIMITATIONS: sitting, standing, transfers, bed mobility, and locomotion level  PARTICIPATION LIMITATIONS: driving, shopping, community activity, occupation, and yard work  PERSONAL FACTORS: Age, Education, Fitness, Past/current experiences, Social background, and Time since onset of injury/illness/exacerbation are also affecting patient's functional outcome.   REHAB POTENTIAL: Fair severity of condition, complex medical/surgical history   CLINICAL DECISION MAKING: Unstable/unpredictable  EVALUATION COMPLEXITY: High  PLAN:  PT FREQUENCY: 2x/week  PT DURATION: 12 weeks  PLANNED INTERVENTIONS: 97750- Physical Performance Testing, 97110-Therapeutic exercises, 97530- Therapeutic activity, [id number]- Neuromuscular re-education, 97535-  Self Care, 02859- Manual therapy, [phone]- Gait training, and [phone]- Electrical stimulation (manual)  PLAN FOR NEXT SESSION: scooting, head hips relationship, anything to progress toward more independent slideboard transfers to reduce burden of care, will need formal/written INTENSIVE stretching program d/t visit limit, static/dynamic sitting balance (lateral leans in prep for slideboard placement/ supine<> sit transition); if brandon is present: what are his thoughts on L trunk lateral and R hip lateral + pommel to prevent windswept posture and adduction?; he is DENSE, so anticipate +2 assist needed for transfers  Delon DELENA Pop, PT, DPT, CBIS 02/05/24 3:50 PM

## 2024-02-06 ENCOUNTER — Other Ambulatory Visit: Payer: Self-pay | Admitting: Physical Medicine and Rehabilitation

## 2024-02-06 ENCOUNTER — Other Ambulatory Visit (HOSPITAL_COMMUNITY): Payer: Self-pay

## 2024-02-10 ENCOUNTER — Other Ambulatory Visit (HOSPITAL_COMMUNITY): Payer: Self-pay

## 2024-02-10 ENCOUNTER — Other Ambulatory Visit: Payer: Self-pay

## 2024-02-11 ENCOUNTER — Other Ambulatory Visit (HOSPITAL_COMMUNITY): Payer: Self-pay

## 2024-02-12 ENCOUNTER — Ambulatory Visit: Admitting: Physical Therapy

## 2024-02-17 ENCOUNTER — Other Ambulatory Visit (HOSPITAL_COMMUNITY): Payer: Self-pay

## 2024-02-18 ENCOUNTER — Other Ambulatory Visit: Payer: Self-pay | Admitting: Physical Medicine and Rehabilitation

## 2024-02-18 ENCOUNTER — Other Ambulatory Visit (HOSPITAL_COMMUNITY): Payer: Self-pay

## 2024-02-18 ENCOUNTER — Ambulatory Visit: Admitting: Physical Therapy

## 2024-02-18 ENCOUNTER — Other Ambulatory Visit: Payer: Self-pay

## 2024-02-18 DIAGNOSIS — M6281 Muscle weakness (generalized): Secondary | ICD-10-CM

## 2024-02-18 DIAGNOSIS — R2681 Unsteadiness on feet: Secondary | ICD-10-CM

## 2024-02-18 DIAGNOSIS — R2689 Other abnormalities of gait and mobility: Secondary | ICD-10-CM | POA: Diagnosis not present

## 2024-02-18 DIAGNOSIS — G959 Disease of spinal cord, unspecified: Secondary | ICD-10-CM

## 2024-02-18 NOTE — Therapy (Signed)
 OUTPATIENT PHYSICAL THERAPY NEURO TREATMENT   Patient Name: Leon Rodriguez MRN: [id number] DOB:1966-04-13, 58 y.o., male Today's Date: 02/18/2024   PCP: Feliciano Barter MD  REFERRING PROVIDER: Cornelio Bouchard MD   END OF SESSION:  PT End of Session - 02/18/24 1445     Visit Number 4    Number of Visits 25    Date for PT Re-Evaluation 04/07/24    Authorization Type [employer]    Authorization Time Period 01/14/24-03/20/24    Authorization - Number of Visits 6    PT Start Time 1443    PT Stop Time 1528    PT Time Calculation (min) 45 min    Equipment Utilized During Treatment Gait belt;Other (comment)   slideboard   Activity Tolerance Patient tolerated treatment well;Patient limited by pain    Behavior During Therapy Flat affect           Past Medical History:  Diagnosis Date   Arthritis    Hip pain    Renal calculi    left   Sepsis (HCC) 06/13/2023   Past Surgical History:  Procedure Laterality Date   ANTERIOR CERVICAL DECOMP/DISCECTOMY FUSION N/A 06/05/2023   Procedure: ANTERIOR CERVICAL DISCECTOMY AND FUSION CERVICAL SIX-SEVEN/CERVICAL SEVEN-THORACIC ONE;  Surgeon: Carollee Lani BROCKS, DO;  Location: [facility];  Service: Neurosurgery;  Laterality: N/A;  3C   APPENDECTOMY     CIRCUMCISION     HIATAL HERNIA REPAIR     HIP SURGERY     at 78 and 58 yo   POSTERIOR CERVICAL FUSION/FORAMINOTOMY N/A 07/12/2023   Procedure: POSTERIOR CERVICAL LAMINECTOMY, INSTRUMENTATION AND FUSION CERVICAL SIX-CERVICAL SEVEN, CERVICAL SEVEN-THORACIC ONE;  Surgeon: Dawley, Lani BROCKS, DO;  Location: [facility];  Service: Neurosurgery;  Laterality: N/A;   Patient Active Problem List   Diagnosis Date Noted   Nerve pain 09/04/2023   Quadriplegia (HCC) 09/04/2023   Pressure injury of left foot, stage 4 (HCC) 09/04/2023   Paraplegia (HCC) 07/17/2023   Spasticity 07/12/2023   Adjustment disorder with depressed mood 06/30/2023   Bacteremia 06/16/2023   Neurogenic bladder 06/14/2023   Lumbar canal stenosis  06/14/2023   Lower extremity weakness 06/14/2023   Acute cystitis without hematuria 06/14/2023   Cervical myelopathy (HCC) 06/07/2023   Cervical spinal stenosis 06/05/2023   Colon cancer screening 03/06/2023   Bilateral hip pain 01/16/2023   Obesity (BMI 30-39.9) 01/16/2023   Polysubstance use disorder 01/16/2023   Right hydrocele 01/07/2023   Epididymo-orchitis 01/06/2023    ONSET DATE: ACDF C6-C7 and C7-T1 05/2023, Posterior cervical laminectomy and fusion C6-C7, C7-T1 07/12/23  REFERRING DIAG:  Diagnosis  G82.50 (ICD-10-CM) - Quadriplegia (HCC)    THERAPY DIAG:  Other abnormalities of gait and mobility  Muscle weakness (generalized)  Unsteadiness on feet  Cervical myelopathy (HCC)  Rationale for Evaluation and Treatment: Rehabilitation  SUBJECTIVE:  SUBJECTIVE STATEMENT:  Pt denies any acute changes since last visit. Pt states things have been going a little bit better at home, got a trapeze which has been helpful. They are working on stretches at home, not every day but working towards that.   Pt accompanied by: family member, wife Delores  PERTINENT HISTORY:   See above, also:   Pt is a 58 yr old male with hx of severe cervical myelopathy s/p ACDF and PCDF C6-T1 by Dr Carollee- in January 2025 with severe uncontrolled spasticity as well as neurogenic bowel and bladder  PAIN:  Are you having pain? Yes: NPRS scale: 7/10 Pain location: BLEs from hips down  Pain description: nerve pain but becomes excruciating when hips are used  Aggravating factors: using legs/movement of LEs/hips  Relieving factors: nothing   PRECAUTIONS: Precautions Precautions: Fall;Cervical Precaution Comments: severe muscle spasms  PATIENT GOALS: address tone in LEs, improve mobility                                                                                                                                TREATMENT  Theract: -discussed cushion pommel vs. Leg straps  -patient preferring pommel  -SBT leading R with MaxA x2 down incline -sit > supine MaxA x1 -supine L LE adducted to 25*   RLE adducted to 37* -windswept for wc adjustments  L trunk lateral, R hip lateral? + pommel  -supine > sit MaxA -prolonged stretch to B adductors  -lateral leans in sitting   ***Pt received seated in PWC. Slide board transfer PWC to mat table with total A. Sit to supine mod A for BLE management. Session focus on PROM stretching of BLE: L hip stretched into abduction, just able to get to neutral Unable to fully straighten knee due to muscle tightness/contracture and spasticity R hip attempted to stretch into hip flexion and knee flexion, able to unlock knee but barely able to bring into flexion due to spasticity Slide board transfer mat table to PWC with max A x 2 (pt's wife assists with transfer)   PATIENT EDUCATION: Education details: see above, eat at least 1 meal/ day outside of bed to start, importance of intensive stretching with Botox , wc adjustments, sit EOB with assist/spv *** Person educated: Patient and Spouse Education method: Explanation Education comprehension: verbalized understanding and returned demonstration  HOME EXERCISE PROGRAM: -eat at least 1 meal/ day outside of bed  -intensive stretching to muscles that received botox   -sit EOB for at least 2 mins/ days for static sitting balance practice with spv/assist  GOALS: Goals reviewed with patient? No  SHORT TERM GOALS: Target date: 02/25/2024    Family/patient to be independent in appropriate LE stretching program as tolerated given severe LE pain even with light touches  Baseline: Goal status: INITIAL  2.  Will be able to complete all bed mobility with no more than MinA  Baseline:  Goal status: INITIAL  3.  Will be  able to perform sliding board transfers with no more than ModA  and Min cues  Baseline:  Goal status: INITIAL    LONG TERM GOALS: Target date: 04/07/2024    Will tolerate at least 10 min in standing frame without increase in LE pain to help manage tone/promote WB  Baseline:  Goal status: INITIAL  2.  Will be able to complete bed mobility with Mod(I) Baseline:  Goal status: INITIAL  3.  Will be able to perform all sliding board transfers (uphill and downhilll transfers) with no more than MinA  Baseline:  Goal status: INITIAL  4.  BLE tone to have improved by 50% and will not interfere with functional mobility/transfers  Baseline:  Goal status: INITIAL  5.  FIST (function in sitting test) for SCI to have improved by at least 10 points  Baseline:  Goal status: INITIAL    ASSESSMENT:  CLINICAL IMPRESSION: Patient seen for skilled PT session with emphasis on*** patient education, transfers and stretching. His B adductor tone has increased to the point where his posture is more windswept. He likely needs, not only a pommel to his wc, but also a L trunk lateral and R hip lateral to prevent further deterioration of his posture. Static sittig balance significantly impaired by this posture necessitating B UE use on EOM. B LE spasms continue to cause a significant amount of pain and increase in intensity with any position changes. He reports that he has not stretched B adductors in the past 2 weeks (since last visit) due to pain. PT emphasized importance of stretching, otherwise Botox  is fruitless. Continue POC.   OBJECTIVE IMPAIRMENTS: decreased activity tolerance, decreased balance, decreased coordination, decreased mobility, difficulty walking, decreased ROM, decreased strength, hypomobility, increased edema, increased fascial restrictions, impaired sensation, impaired tone, and pain.   ACTIVITY LIMITATIONS: sitting, standing, transfers, bed mobility, and locomotion  level  PARTICIPATION LIMITATIONS: driving, shopping, community activity, occupation, and yard work  PERSONAL FACTORS: Age, Education, Fitness, Past/current experiences, Social background, and Time since onset of injury/illness/exacerbation are also affecting patient's functional outcome.   REHAB POTENTIAL: Fair severity of condition, complex medical/surgical history   CLINICAL DECISION MAKING: Unstable/unpredictable  EVALUATION COMPLEXITY: High  PLAN:  PT FREQUENCY: 2x/week  PT DURATION: 12 weeks  PLANNED INTERVENTIONS: 97750- Physical Performance Testing, 97110-Therapeutic exercises, 97530- Therapeutic activity, [id number]- Neuromuscular re-education, 97535- Self Care, 02859- Manual therapy, [phone]- Gait training, and [phone]- Electrical stimulation (manual)  PLAN FOR NEXT SESSION: scooting, head hips relationship, anything to progress toward more independent slideboard transfers to reduce burden of care, will need formal/written INTENSIVE stretching program d/t visit limit, static/dynamic sitting balance (lateral leans in prep for slideboard placement/ supine<> sit transition); if brandon is present: what are his thoughts on L trunk lateral and R hip lateral + pommel to prevent windswept posture and adduction?; he is DENSE, so anticipate +2 assist needed for transfers***  Waddell [place]gate, PT, DPT, CSRS  02/18/24 3:29 PM

## 2024-02-19 ENCOUNTER — Encounter: Attending: Physical Medicine and Rehabilitation | Admitting: Physical Medicine and Rehabilitation

## 2024-02-19 ENCOUNTER — Encounter: Payer: Self-pay | Admitting: Physical Medicine and Rehabilitation

## 2024-02-19 VITALS — BP 113/76 | HR 107

## 2024-02-19 DIAGNOSIS — R252 Cramp and spasm: Secondary | ICD-10-CM | POA: Insufficient documentation

## 2024-02-19 DIAGNOSIS — G959 Disease of spinal cord, unspecified: Secondary | ICD-10-CM | POA: Diagnosis present

## 2024-02-19 NOTE — Patient Instructions (Signed)
 Follow up in 3 months for botox  IF you have not yet gotten your baclofen  pump  Please call the office and let us  know when you are schdeuled to get your pump after surgical ocnsultation  I would not repeat phenol neurolysis

## 2024-02-19 NOTE — Progress Notes (Unsigned)
 Subjective:    Patient ID: Leon Rodriguez, male    DOB: 25-Apr-1966, 58 y.o.   MRN: [id number]  HPI   Leon Rodriguez is a 58 y.o. year old male  who  has a past medical history of Arthritis, Hip pain, Renal calculi, and Sepsis (HCC) (06/13/2023).   They are presenting to PM&R clinic for follow up related to BL LE spasticity d/t SCI - follows with Dr. Lovorn.   Plan from last visit: Pre-injection adductor tone: MAS 3 RLE, MAS 4 LLE Post-injection adductor tone: MAS 2 RLE, MAS 3-4 LLE  (c/b frequent spasms)   Interval Hx:  - Therapies: ***  - Follow ups: Seen by NSGY at [facility]: ADDENDUM 02/13/2024: Imaging received and reviewed. Per Dr. 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 They were unable to complete a myelogram, so it may be difficult to do a baclofen  trial but he would be willing to discuss placing the pump without a trial. I contacted Leon Rodriguez to let him know this and will get an appt scheduled for further discussion.     - Falls:***  - DME:***  - Medications: ***  - Other concerns: ***  Pain Inventory Average Pain 10 Pain Right Now 7 My pain is sharp, dull, and aching  In the last 24 hours, has pain interfered with the following? General activity 7 Relation with others 10 Enjoyment of life 10 What TIME of day is your pain at its worst? night Sleep (in general) Poor  Pain is worse with: sitting and some activites Pain improves with: heat/ice and medication Relief from Meds: 3  No family history on file. Social History   Socioeconomic History   Marital status: Married    Spouse name: Not on file   Number of children: Not on file   Years of education: Not on file   Highest education level: Not on file  Occupational History   Not on file  Tobacco Use   Smoking status: Every Day    Types: Cigars   Smokeless tobacco: Never  Vaping Use   Vaping status: Never Used  Substance and Sexual Activity   Alcohol use: Not Currently    Drug use: Yes    Types: Marijuana   Sexual activity: Not on file  Other Topics Concern   Not on file  Social History Narrative   Not on file   Social Drivers of Health   Financial Resource Strain: Not on file  Food Insecurity: Unknown (07/14/2023)   Hunger Vital Sign    Worried About Running Out of Food in the Last Year: Patient unable to answer    Ran Out of Food in the Last Year: Never true  Transportation Needs: No Transportation Needs (07/14/2023)   PRAPARE - Administrator, Civil Service (Medical): No    Lack of Transportation (Non-Medical): No  Physical Activity: Not on file  Stress: Not on file  Social Connections: Not on file   Past Surgical History:  Procedure Laterality Date   ANTERIOR CERVICAL DECOMP/DISCECTOMY FUSION N/A 06/05/2023   Procedure: ANTERIOR CERVICAL DISCECTOMY AND FUSION CERVICAL SIX-SEVEN/CERVICAL SEVEN-THORACIC ONE;  Surgeon: Carollee Lani BROCKS, DO;  Location: [facility];  Service: Neurosurgery;  Laterality: N/A;  3C   APPENDECTOMY     CIRCUMCISION     HIATAL HERNIA REPAIR     HIP SURGERY     at 19 and 58 yo   POSTERIOR CERVICAL FUSION/FORAMINOTOMY N/A 07/12/2023   Procedure: POSTERIOR CERVICAL  LAMINECTOMY, INSTRUMENTATION AND FUSION CERVICAL SIX-CERVICAL SEVEN, CERVICAL SEVEN-THORACIC ONE;  Surgeon: Dawley, Lani BROCKS, DO;  Location: [facility];  Service: Neurosurgery;  Laterality: N/A;   Past Surgical History:  Procedure Laterality Date   ANTERIOR CERVICAL DECOMP/DISCECTOMY FUSION N/A 06/05/2023   Procedure: ANTERIOR CERVICAL DISCECTOMY AND FUSION CERVICAL SIX-SEVEN/CERVICAL SEVEN-THORACIC ONE;  Surgeon: Carollee Lani BROCKS, DO;  Location: [facility];  Service: Neurosurgery;  Laterality: N/A;  3C   APPENDECTOMY     CIRCUMCISION     HIATAL HERNIA REPAIR     HIP SURGERY     at 64 and 58 yo   POSTERIOR CERVICAL FUSION/FORAMINOTOMY N/A 07/12/2023   Procedure: POSTERIOR CERVICAL LAMINECTOMY, INSTRUMENTATION AND FUSION CERVICAL SIX-CERVICAL SEVEN, CERVICAL  SEVEN-THORACIC ONE;  Surgeon: Dawley, Lani BROCKS, DO;  Location: [facility];  Service: Neurosurgery;  Laterality: N/A;   Past Medical History:  Diagnosis Date   Arthritis    Hip pain    Renal calculi    left   Sepsis (HCC) 06/13/2023   BP 113/76 (BP Location: Left Arm, Patient Position: Sitting, Cuff Size: Normal)   Pulse (!) 107   SpO2 96%   Opioid Risk Score:   Fall Risk Score:  `1  Depression screen PHQ 2/9     02/19/2024    1:23 PM 01/20/2024    3:03 PM 12/04/2023   10:02 AM 12/04/2023   10:00 AM 11/20/2023    8:56 AM 10/09/2023   10:28 AM 04/05/2023    9:16 AM  Depression screen PHQ 2/9  Decreased Interest 0 3 3 0 2 0 3  Down, Depressed, Hopeless 0 3 3 0 2 0 3  PHQ - 2 Score 0 6 6 0 4 0 6  Altered sleeping 0  1    3  Tired, decreased energy 0  3    3  Change in appetite 0  0    3  Feeling bad or failure about yourself  0  0    0  Trouble concentrating 0  0    3  Moving slowly or fidgety/restless 0  0    3  Suicidal thoughts 0  0    0  PHQ-9 Score 0  10    21  Difficult doing work/chores Not difficult at all  Somewhat difficult    Extremely dIfficult    Review of Systems  Musculoskeletal:  Positive for arthralgias and myalgias.       Bilateral lower leg pain, bilateral hip pain  All other systems reviewed and are negative.      Objective:   Physical Exam   Exam: RLE- HF 1/5 B/L; KE 2/5- DF and PF 2/5 B/L- unchanged LLE - HF 1/5, KE 1/5, DF/PF 1/5 - c/b spasms with movement  Neuro: goes into severe spasms LLE HF, KF, adduction with any ROM in either leg  LLE: MAS of 4 HF , Mas 4 KF, Mas 3 adductors (can break!); Mas 2 PF RLE MAS 2 HF, KF, Mas 2 adductors, Mas 1-2 PF       Assessment & Plan:   Leon Rodriguez is a 58 y.o. year old male  who  has a past medical history of Arthritis, Hip pain, Renal calculi, and Sepsis (HCC) (06/13/2023).   They are presenting to PM&R clinic as a new patient for treatment of *** . They were referred by *** . Based on their  presentation, *** .  There are no diagnoses linked to this encounter.

## 2024-02-20 ENCOUNTER — Other Ambulatory Visit (HOSPITAL_COMMUNITY): Payer: Self-pay

## 2024-02-20 ENCOUNTER — Other Ambulatory Visit: Payer: Self-pay

## 2024-02-20 MED ORDER — POLYETHYLENE GLYCOL 3350 17 GM/SCOOP PO POWD
17.0000 g | Freq: Every day | ORAL | 5 refills | Status: DC
  Filled 2024-02-20: qty 476, 28d supply, fill #0
  Filled 2024-03-16 (×2): qty 476, 28d supply, fill #1
  Filled 2024-04-06: qty 476, 28d supply, fill #2
  Filled 2024-05-04: qty 476, 28d supply, fill #3
  Filled 2024-05-25: qty 476, 28d supply, fill #4
  Filled 2024-06-17: qty 510, 30d supply, fill #5

## 2024-02-25 ENCOUNTER — Other Ambulatory Visit (HOSPITAL_COMMUNITY): Payer: Self-pay

## 2024-02-26 ENCOUNTER — Ambulatory Visit: Attending: Physical Medicine and Rehabilitation | Admitting: Physical Therapy

## 2024-02-26 DIAGNOSIS — G959 Disease of spinal cord, unspecified: Secondary | ICD-10-CM | POA: Diagnosis present

## 2024-02-26 DIAGNOSIS — M6281 Muscle weakness (generalized): Secondary | ICD-10-CM | POA: Diagnosis present

## 2024-02-26 DIAGNOSIS — R2689 Other abnormalities of gait and mobility: Secondary | ICD-10-CM | POA: Insufficient documentation

## 2024-02-26 NOTE — Therapy (Signed)
 OUTPATIENT PHYSICAL THERAPY NEURO TREATMENT   Patient Name: Leon Rodriguez MRN: [id number] DOB:08-14-1965, 58 y.o., male Today's Date: 02/26/2024   PCP: Feliciano Barter MD  REFERRING PROVIDER: Cornelio Bouchard MD   END OF SESSION:  PT End of Session - 02/26/24 1446     Visit Number 5    Number of Visits 25    Date for PT Re-Evaluation 04/07/24    Authorization Type [employer]    Authorization Time Period 01/14/24-03/20/24    Authorization - Number of Visits 6    PT Start Time 1445    PT Stop Time 1525    PT Time Calculation (min) 40 min    Equipment Utilized During Treatment Gait belt    Activity Tolerance Patient limited by pain    Behavior During Therapy Flat affect            Past Medical History:  Diagnosis Date   Arthritis    Hip pain    Renal calculi    left   Sepsis (HCC) 06/13/2023   Past Surgical History:  Procedure Laterality Date   ANTERIOR CERVICAL DECOMP/DISCECTOMY FUSION N/A 06/05/2023   Procedure: ANTERIOR CERVICAL DISCECTOMY AND FUSION CERVICAL SIX-SEVEN/CERVICAL SEVEN-THORACIC ONE;  Surgeon: Carollee Lani BROCKS, DO;  Location: [facility] OR;  Service: Neurosurgery;  Laterality: N/A;  3C   APPENDECTOMY     CIRCUMCISION     HIATAL HERNIA REPAIR     HIP SURGERY     at 4 and 58 yo   POSTERIOR CERVICAL FUSION/FORAMINOTOMY N/A 07/12/2023   Procedure: POSTERIOR CERVICAL LAMINECTOMY, INSTRUMENTATION AND FUSION CERVICAL SIX-CERVICAL SEVEN, CERVICAL SEVEN-THORACIC ONE;  Surgeon: Dawley, Lani BROCKS, DO;  Location: [facility] OR;  Service: Neurosurgery;  Laterality: N/A;   Patient Active Problem List   Diagnosis Date Noted   Nerve pain 09/04/2023   Quadriplegia (HCC) 09/04/2023   Pressure injury of left foot, stage 4 (HCC) 09/04/2023   Paraplegia (HCC) 07/17/2023   Spasticity 07/12/2023   Adjustment disorder with depressed mood 06/30/2023   Bacteremia 06/16/2023   Neurogenic bladder 06/14/2023   Lumbar canal stenosis 06/14/2023   Lower extremity weakness 06/14/2023   Acute  cystitis without hematuria 06/14/2023   Cervical myelopathy (HCC) 06/07/2023   Cervical spinal stenosis 06/05/2023   Colon cancer screening 03/06/2023   Bilateral hip pain 01/16/2023   Obesity (BMI 30-39.9) 01/16/2023   Polysubstance use disorder 01/16/2023   Right hydrocele 01/07/2023   Epididymo-orchitis 01/06/2023    ONSET DATE: ACDF C6-C7 and C7-T1 05/2023, Posterior cervical laminectomy and fusion C6-C7, C7-T1 07/12/23  REFERRING DIAG:  Diagnosis  G82.50 (ICD-10-CM) - Quadriplegia (HCC)    THERAPY DIAG:  Other abnormalities of gait and mobility  Muscle weakness (generalized)  Cervical myelopathy (HCC)  Rationale for Evaluation and Treatment: Rehabilitation  SUBJECTIVE:  SUBJECTIVE STATEMENT:  Pt reports that the spasticity in his legs is getting worse, feels like his legs are wanting to cross over now. He did have Botox  injections again with Dr. Emeline the other day, she did his adductors and his L knee. Patient reports that the 2nd time he had Botox  he did notice a slight improvement in the flexibility of his legs but the L leg is tricky because of his hip joint. She will do Botox  again in October if he does not get approved for Baclofen  pump.  Pt sees Dr. Lovorn on 9/24, will potentially discuss Baclofen  pump further at that time.  Pt's wife reports she is worried about the wound on his L heel. Rate his pain as 1/10 wound pain in L heel, 10/10 when having spasms.   Pt accompanied by: family member, wife Delores  PERTINENT HISTORY:   See above, also:   Pt is a 58 yr old male with hx of severe cervical myelopathy s/p ACDF and PCDF C6-T1 by Dr Carollee- in January 2025 with severe uncontrolled spasticity as well as neurogenic bowel and bladder  PAIN:  Are you having pain? Yes: NPRS  scale: 7/10 Pain location: BLEs from hips down  Pain description: nerve pain but becomes excruciating when hips are used  Aggravating factors: using legs/movement of LEs/hips  Relieving factors: nothing   PRECAUTIONS: Precautions Precautions: Fall;Cervical Precaution Comments: severe muscle spasms  PATIENT GOALS: address tone in LEs, improve mobility                                                                                                                               TREATMENT   TherAct Pt received seated in PWC. Pt's wife expresses concern about his L heel wound. Pt agreeable to have this therapist look at his L heel. Removed dressing that patient's wife had placed on his heel. Pt found to have a tunneling wound on his L heel as well as dark skin on his heel/plantar aspect of his foot that is not blanchable, potential for deep tissue injury.    Per pt and his wife his R heel has no issues, he declines for it to be assessed. Encouraged patient and his wife to reach out to wound care about his tunneling wound and potential deep tissue injury on his L heel as well as to reach out to Dr. Lovorn about this. Remainder of session focus on PROM stretching of BLE while patient seated in PWC for time conservation: L hip stretched into abduction, just able to get to neutral Unable to fully straighten L knee due to muscle tightness/contracture and spasticity R hip attempted to stretch into hip flexion and knee flexion, able to unlock knee but barely able to bring into flexion due to spasticity. Able to place on foot plate of PWC and placed dycem under it to prevent sliding.  Discussed PT POC with plan to likely add one more visit in this POC  to reschedule appointment with Penne from [facility] to address his positioning in PWC. Primary PT will reach out to pt to schedule once date is confirmed with Penne. Discussed that so far insurance has only approved 6 visits and unsure if they would  approve further visits due to lack of progress. Pt may benefit from additional visits after he gets his Baclofen  pump placed.  PATIENT EDUCATION: Education details: see above, continue with stretching program as able Person educated: Patient and Spouse Education method: Explanation Education comprehension: verbalized understanding and returned demonstration  HOME EXERCISE PROGRAM: -eat at least 1 meal/ day outside of bed  -intensive stretching to muscles that received botox   -sit EOB for at least 2 mins/ days for static sitting balance practice with spv/assist  GOALS: Goals reviewed with patient? No  SHORT TERM GOALS: Target date: 02/25/2024    Family/patient to be independent in appropriate LE stretching program as tolerated given severe LE pain even with light touches  Baseline: Goal status: IN PROGRESS  2.  Will be able to complete all bed mobility with no more than MinA  Baseline: max A (9/3) Goal status: IN PROGRESS  3.  Will be able to perform sliding board transfers with no more than ModA  and Min cues  Baseline: max A (9/3) Goal status: IN PROGRESS    LONG TERM GOALS: Target date: 04/07/2024    Will tolerate at least 10 min in standing frame without increase in LE pain to help manage tone/promote WB  Baseline:  Goal status: INITIAL  2.  Will be able to complete bed mobility with Mod(I) Baseline:  Goal status: INITIAL  3.  Will be able to perform all sliding board transfers (uphill and downhilll transfers) with no more than MinA  Baseline:  Goal status: INITIAL  4.  BLE tone to have improved by 50% and will not interfere with functional mobility/transfers  Baseline:  Goal status: INITIAL  5.  FIST (function in sitting test) for SCI to have improved by at least 10 points  Baseline:  Goal status: INITIAL    ASSESSMENT:  CLINICAL IMPRESSION: Emphasis of skilled PT session on assessing STG, continuing to work on stretching BLE, and assessing his L heel  wound and potential deep pressure injury. He is making progress towards 3/3 STG but has not really improved in his level of independence with transfers and bed mobility at this point. He remains very limited by significant BLE spasticity. Plan to schedule one more PT visit for assessment with ATP from [facility] for options to add to his PWC to assist with positioning. Pt could potentially benefit from future PT visits after Baclofen  pump placement. Continue POC.    OBJECTIVE IMPAIRMENTS: decreased activity tolerance, decreased balance, decreased coordination, decreased mobility, difficulty walking, decreased ROM, decreased strength, hypomobility, increased edema, increased fascial restrictions, impaired sensation, impaired tone, and pain.   ACTIVITY LIMITATIONS: sitting, standing, transfers, bed mobility, and locomotion level  PARTICIPATION LIMITATIONS: driving, shopping, community activity, occupation, and yard work  PERSONAL FACTORS: Age, Education, Fitness, Past/current experiences, Social background, and Time since onset of injury/illness/exacerbation are also affecting patient's functional outcome.   REHAB POTENTIAL: Fair severity of condition, complex medical/surgical history   CLINICAL DECISION MAKING: Unstable/unpredictable  EVALUATION COMPLEXITY: High  PLAN:  PT FREQUENCY: 2x/week  PT DURATION: 12 weeks  PLANNED INTERVENTIONS: 97750- Physical Performance Testing, 97110-Therapeutic exercises, 97530- Therapeutic activity, [id number]- Neuromuscular re-education, 97535- Self Care, [id number]- Manual therapy, [id number]- Gait training, and [id number]- Electrical stimulation (manual)  PLAN FOR NEXT  SESSION: scooting, head hips relationship, anything to progress toward more independent slideboard transfers to reduce burden of care, will need formal/written INTENSIVE stretching program d/t visit limit, static/dynamic sitting balance (lateral leans in prep for slideboard placement/ supine<> sit transition); if  brandon is present: what are his thoughts on L trunk lateral and R hip lateral + pommel to prevent windswept posture and adduction?; he is DENSE, so anticipate +2 assist needed for transfers, schedule more visits (will need re-auth at visit 6), did they schedule with wound care about his L heel? Did we hear back from Dr. Lovorn about his wounds?  Taesean Reth, PT, DPT, CSRS  02/26/24 3:30 PM

## 2024-02-27 ENCOUNTER — Other Ambulatory Visit (HOSPITAL_COMMUNITY): Payer: Self-pay

## 2024-02-27 MED ORDER — DULOXETINE HCL 60 MG PO CPEP
60.0000 mg | ORAL_CAPSULE | Freq: Every day | ORAL | 1 refills | Status: AC
  Filled 2024-02-27: qty 90, 90d supply, fill #0
  Filled 2024-05-25: qty 90, 90d supply, fill #1

## 2024-02-27 MED ORDER — TIZANIDINE HCL 2 MG PO TABS
2.0000 mg | ORAL_TABLET | Freq: Three times a day (TID) | ORAL | 2 refills | Status: DC
  Filled 2024-02-27: qty 90, 30d supply, fill #0

## 2024-03-04 ENCOUNTER — Other Ambulatory Visit: Payer: Self-pay

## 2024-03-04 ENCOUNTER — Emergency Department (HOSPITAL_COMMUNITY)

## 2024-03-04 ENCOUNTER — Telehealth: Payer: Self-pay | Admitting: *Deleted

## 2024-03-04 ENCOUNTER — Inpatient Hospital Stay (HOSPITAL_COMMUNITY)
Admission: EM | Admit: 2024-03-04 | Discharge: 2024-03-09 | DRG: 871 | Disposition: A | Discharge status: Home / Self Care | Attending: Internal Medicine | Admitting: Internal Medicine

## 2024-03-04 ENCOUNTER — Encounter (HOSPITAL_COMMUNITY): Payer: Self-pay | Admitting: Physician Assistant

## 2024-03-04 DIAGNOSIS — I3139 Other pericardial effusion (noninflammatory): Secondary | ICD-10-CM | POA: Diagnosis present

## 2024-03-04 DIAGNOSIS — F1210 Cannabis abuse, uncomplicated: Secondary | ICD-10-CM | POA: Diagnosis present

## 2024-03-04 DIAGNOSIS — E876 Hypokalemia: Secondary | ICD-10-CM | POA: Diagnosis present

## 2024-03-04 DIAGNOSIS — D72829 Elevated white blood cell count, unspecified: Secondary | ICD-10-CM

## 2024-03-04 DIAGNOSIS — A419 Sepsis, unspecified organism: Principal | ICD-10-CM | POA: Diagnosis present

## 2024-03-04 DIAGNOSIS — Z993 Dependence on wheelchair: Secondary | ICD-10-CM | POA: Diagnosis not present

## 2024-03-04 DIAGNOSIS — I5021 Acute systolic (congestive) heart failure: Secondary | ICD-10-CM | POA: Diagnosis not present

## 2024-03-04 DIAGNOSIS — Z79899 Other long term (current) drug therapy: Secondary | ICD-10-CM | POA: Diagnosis not present

## 2024-03-04 DIAGNOSIS — R7303 Prediabetes: Secondary | ICD-10-CM | POA: Diagnosis present

## 2024-03-04 DIAGNOSIS — Z87440 Personal history of urinary (tract) infections: Secondary | ICD-10-CM | POA: Diagnosis not present

## 2024-03-04 DIAGNOSIS — R0789 Other chest pain: Secondary | ICD-10-CM | POA: Diagnosis not present

## 2024-03-04 DIAGNOSIS — R7982 Elevated C-reactive protein (CRP): Secondary | ICD-10-CM | POA: Diagnosis present

## 2024-03-04 DIAGNOSIS — Z981 Arthrodesis status: Secondary | ICD-10-CM

## 2024-03-04 DIAGNOSIS — L89620 Pressure ulcer of left heel, unstageable: Secondary | ICD-10-CM | POA: Diagnosis present

## 2024-03-04 DIAGNOSIS — Z7401 Bed confinement status: Secondary | ICD-10-CM | POA: Diagnosis not present

## 2024-03-04 DIAGNOSIS — I480 Paroxysmal atrial fibrillation: Secondary | ICD-10-CM | POA: Diagnosis present

## 2024-03-04 DIAGNOSIS — I110 Hypertensive heart disease with heart failure: Secondary | ICD-10-CM | POA: Diagnosis present

## 2024-03-04 DIAGNOSIS — K592 Neurogenic bowel, not elsewhere classified: Secondary | ICD-10-CM | POA: Diagnosis present

## 2024-03-04 DIAGNOSIS — G8250 Quadriplegia, unspecified: Secondary | ICD-10-CM | POA: Diagnosis present

## 2024-03-04 DIAGNOSIS — N319 Neuromuscular dysfunction of bladder, unspecified: Secondary | ICD-10-CM | POA: Diagnosis present

## 2024-03-04 DIAGNOSIS — I4891 Unspecified atrial fibrillation: Secondary | ICD-10-CM | POA: Diagnosis not present

## 2024-03-04 DIAGNOSIS — F17290 Nicotine dependence, other tobacco product, uncomplicated: Secondary | ICD-10-CM | POA: Diagnosis present

## 2024-03-04 DIAGNOSIS — I300 Acute nonspecific idiopathic pericarditis: Secondary | ICD-10-CM | POA: Diagnosis present

## 2024-03-04 DIAGNOSIS — J189 Pneumonia, unspecified organism: Secondary | ICD-10-CM | POA: Diagnosis present

## 2024-03-04 LAB — CBC WITH DIFFERENTIAL/PLATELET
Abs Immature Granulocytes: 0.09 K/uL — ABNORMAL HIGH (ref 0.00–0.07)
Basophils Absolute: 0 K/uL (ref 0.0–0.1)
Basophils Relative: 0 %
Eosinophils Absolute: 0 K/uL (ref 0.0–0.5)
Eosinophils Relative: 0 %
HCT: 37.5 % — ABNORMAL LOW (ref 39.0–52.0)
Hemoglobin: 13.1 g/dL (ref 13.0–17.0)
Immature Granulocytes: 1 %
Lymphocytes Relative: 13 %
Lymphs Abs: 2.2 K/uL (ref 0.7–4.0)
MCH: 31.6 pg (ref 26.0–34.0)
MCHC: 34.9 g/dL (ref 30.0–36.0)
MCV: 90.4 fL (ref 80.0–100.0)
Monocytes Absolute: 1.8 K/uL — ABNORMAL HIGH (ref 0.1–1.0)
Monocytes Relative: 11 %
Neutro Abs: 12.3 K/uL — ABNORMAL HIGH (ref 1.7–7.7)
Neutrophils Relative %: 75 %
Platelets: 280 K/uL (ref 150–400)
RBC: 4.15 MIL/uL — ABNORMAL LOW (ref 4.22–5.81)
RDW: 13.6 % (ref 11.5–15.5)
WBC: 16.4 K/uL — ABNORMAL HIGH (ref 4.0–10.5)
nRBC: 0 % (ref 0.0–0.2)

## 2024-03-04 LAB — URINALYSIS, W/ REFLEX TO CULTURE (INFECTION SUSPECTED)
Bilirubin Urine: NEGATIVE
Glucose, UA: NEGATIVE mg/dL
Hgb urine dipstick: NEGATIVE
Ketones, ur: NEGATIVE mg/dL
Leukocytes,Ua: NEGATIVE
Nitrite: NEGATIVE
Protein, ur: 30 mg/dL — AB
Specific Gravity, Urine: 1.029 (ref 1.005–1.030)
pH: 5 (ref 5.0–8.0)

## 2024-03-04 LAB — CBC
HCT: 35.2 % — ABNORMAL LOW (ref 39.0–52.0)
Hemoglobin: 11.9 g/dL — ABNORMAL LOW (ref 13.0–17.0)
MCH: 31.5 pg (ref 26.0–34.0)
MCHC: 33.8 g/dL (ref 30.0–36.0)
MCV: 93.1 fL (ref 80.0–100.0)
Platelets: 256 K/uL (ref 150–400)
RBC: 3.78 MIL/uL — ABNORMAL LOW (ref 4.22–5.81)
RDW: 14.1 % (ref 11.5–15.5)
WBC: 16.2 K/uL — ABNORMAL HIGH (ref 4.0–10.5)
nRBC: 0 % (ref 0.0–0.2)

## 2024-03-04 LAB — COMPREHENSIVE METABOLIC PANEL WITH GFR
ALT: 42 U/L (ref 0–44)
AST: 55 U/L — ABNORMAL HIGH (ref 15–41)
Albumin: 2.7 g/dL — ABNORMAL LOW (ref 3.5–5.0)
Alkaline Phosphatase: 129 U/L — ABNORMAL HIGH (ref 38–126)
Anion gap: 10 (ref 5–15)
BUN: 13 mg/dL (ref 6–20)
CO2: 22 mmol/L (ref 22–32)
Calcium: 8.6 mg/dL — ABNORMAL LOW (ref 8.9–10.3)
Chloride: 100 mmol/L (ref 98–111)
Creatinine, Ser: 0.53 mg/dL — ABNORMAL LOW (ref 0.61–1.24)
GFR, Estimated: >60 mL/min (ref >60)
Glucose, Bld: 143 mg/dL — ABNORMAL HIGH (ref 70–99)
Potassium: 4.1 mmol/L (ref 3.5–5.1)
Sodium: 132 mmol/L — ABNORMAL LOW (ref 135–145)
Total Bilirubin: 0.6 mg/dL (ref 0.0–1.2)
Total Protein: 6.1 g/dL — ABNORMAL LOW (ref 6.5–8.1)

## 2024-03-04 LAB — BRAIN NATRIURETIC PEPTIDE: B Natriuretic Peptide: 43.7 pg/mL (ref 0.0–100.0)

## 2024-03-04 LAB — PROTIME-INR
INR: 1.3 — ABNORMAL HIGH (ref 0.8–1.2)
Prothrombin Time: 16.5 s — ABNORMAL HIGH (ref 11.4–15.2)

## 2024-03-04 LAB — SEDIMENTATION RATE: Sed Rate: 49 mm/h — ABNORMAL HIGH (ref 0–16)

## 2024-03-04 LAB — C-REACTIVE PROTEIN: CRP: 20.8 mg/dL — ABNORMAL HIGH (ref <1.0)

## 2024-03-04 LAB — RESP PANEL BY RT-PCR (RSV, FLU A&B, COVID)  RVPGX2
Influenza A by PCR: NEGATIVE
Influenza B by PCR: NEGATIVE
Resp Syncytial Virus by PCR: NEGATIVE
SARS Coronavirus 2 by RT PCR: NEGATIVE

## 2024-03-04 LAB — PROCALCITONIN: Procalcitonin: 0.14 ng/mL

## 2024-03-04 LAB — TSH: TSH: 1.372 u[IU]/mL (ref 0.350–4.500)

## 2024-03-04 LAB — CREATININE, SERUM
Creatinine, Ser: 0.49 mg/dL — ABNORMAL LOW (ref 0.61–1.24)
GFR, Estimated: >60 mL/min (ref >60)

## 2024-03-04 LAB — I-STAT CG4 LACTIC ACID, ED
Lactic Acid, Venous: 1.1 mmol/L (ref 0.5–1.9)
Lactic Acid, Venous: 0.9 mmol/L (ref 0.5–1.9)

## 2024-03-04 LAB — HIV ANTIBODY (ROUTINE TESTING W REFLEX): HIV Screen 4th Generation wRfx: NONREACTIVE

## 2024-03-04 LAB — CK: Total CK: 24 U/L — ABNORMAL LOW (ref 49–397)

## 2024-03-04 LAB — TROPONIN I (HIGH SENSITIVITY)
Troponin I (High Sensitivity): 7 ng/L (ref <18)
Troponin I (High Sensitivity): 6 ng/L (ref <18)

## 2024-03-04 MED ORDER — MORPHINE SULFATE (PF) 4 MG/ML IV SOLN
4.0000 mg | Freq: Once | INTRAVENOUS | Status: AC
  Administered 2024-03-04: 4 mg via INTRAVENOUS
  Filled 2024-03-04: qty 1

## 2024-03-04 MED ORDER — ACETAMINOPHEN 325 MG PO TABS: 650.0000 mg | ORAL_TABLET | Freq: Four times a day (QID) | ORAL | Status: DC | PRN

## 2024-03-04 MED ORDER — IOHEXOL 350 MG/ML SOLN
75.0000 mL | Freq: Once | INTRAVENOUS | Status: AC | PRN
  Administered 2024-03-04: 75 mL via INTRAVENOUS

## 2024-03-04 MED ORDER — ACETAMINOPHEN 650 MG RE SUPP: 650.0000 mg | Freq: Four times a day (QID) | RECTAL | Status: DC | PRN

## 2024-03-04 MED ORDER — LACTATED RINGERS IV BOLUS (SEPSIS)
1000.0000 mL | Freq: Once | INTRAVENOUS | Status: AC
  Administered 2024-03-04: 1000 mL via INTRAVENOUS

## 2024-03-04 MED ORDER — HYDRALAZINE HCL 20 MG/ML IJ SOLN: 10.0000 mg | Freq: Four times a day (QID) | INTRAMUSCULAR | Status: DC | PRN

## 2024-03-04 MED ORDER — HEPARIN SODIUM (PORCINE) 5000 UNIT/ML IJ SOLN
5000.0000 [IU] | Freq: Three times a day (TID) | INTRAMUSCULAR | Status: DC
  Administered 2024-03-04 – 2024-03-06 (×5): 5000 [IU] via SUBCUTANEOUS
  Filled 2024-03-04 (×7): qty 1

## 2024-03-04 MED ORDER — ONDANSETRON HCL 4 MG PO TABS: 4.0000 mg | ORAL_TABLET | Freq: Four times a day (QID) | ORAL | Status: DC | PRN

## 2024-03-04 MED ORDER — ALBUTEROL SULFATE (2.5 MG/3ML) 0.083% IN NEBU: 2.5000 mg | INHALATION_SOLUTION | RESPIRATORY_TRACT | Status: DC | PRN

## 2024-03-04 MED ORDER — SODIUM CHLORIDE 0.9 % IV SOLN
500.0000 mg | INTRAVENOUS | Status: DC
  Administered 2024-03-04: 500 mg via INTRAVENOUS
  Filled 2024-03-04: qty 5

## 2024-03-04 MED ORDER — SODIUM CHLORIDE 0.9 % IV BOLUS
1000.0000 mL | Freq: Once | INTRAVENOUS | Status: AC
  Administered 2024-03-04: 1000 mL via INTRAVENOUS

## 2024-03-04 MED ORDER — ONDANSETRON HCL 4 MG/2ML IJ SOLN
4.0000 mg | Freq: Four times a day (QID) | INTRAMUSCULAR | Status: DC | PRN
  Administered 2024-03-08: 4 mg via INTRAVENOUS
  Filled 2024-03-04: qty 2

## 2024-03-04 MED ORDER — ONDANSETRON HCL 4 MG/2ML IJ SOLN
4.0000 mg | Freq: Once | INTRAMUSCULAR | Status: AC
  Administered 2024-03-04: 4 mg via INTRAVENOUS
  Filled 2024-03-04: qty 2

## 2024-03-04 MED ORDER — SODIUM CHLORIDE 0.9 % IV SOLN
2.0000 g | INTRAVENOUS | Status: AC
  Administered 2024-03-04 – 2024-03-08 (×5): 2 g via INTRAVENOUS
  Filled 2024-03-04 (×5): qty 20

## 2024-03-04 NOTE — ED Notes (Signed)
 Pt's wife provided in & out cath kit as pt self caths at home, they are here without supplies.

## 2024-03-04 NOTE — ED Notes (Signed)
 CCMD CALLED

## 2024-03-04 NOTE — H&P (Signed)
 History and Physical    Leon Rodriguez FMW:[id number] DOB: [birth year] DOA: 03/04/2024  PCP: [name], MD  Patient coming from: home  I have personally briefly reviewed patient's old medical records in [facility]  Chief Complaint: diaphoresis / dark urine   HPI: Leon Rodriguez is a 58 y.o. male with medical history significant for quadriplegia secondary to chronic C6-T1 cervical myelopathy s/p ACDF 06/05/2023 , neurogenic bladder, neurogenic bowel , spasticity of LE, hx of sepsis due to UTI,who presents to ED brought in by wife due to concern for possible UTI as patient was noted to be diaphoretic and have dark urine. Patient also notes on ros hx of cough  but denies any shortness of breath, chest pain , n/v/d or diarrhea.  ED Course:  Vitals: afeb  hr 105 , BP 83/71,  rr 22 sat 98% on ra  Labs  Wbc 16.4 (6.3), hg 13.1, noted left shift,plt 280 Na 132, K 4.1, Cl 100, glu 143, cr 0.53 ast 55, Alt 42, alphos 129 CK 24 UA:neg Cxr IMPRESSION: 1. Mild left basilar atelectasis and/or infiltrate. 2. Small left pleural effusion.   CT chest abd/pelvis  IMPRESSION: Moderate to large pericardial effusion, new since prior study.   Small bilateral pleural effusions, left greater than right. Minimal right base atelectasis. Left basilar atelectasis or infiltrate.   No acute findings in the abdomen or pelvis.   Moderate stool burden throughout the colon. Tx 1L Lr , morphine ,zofran   Review of Systems: As per HPI otherwise 10 point review of systems negative.   Past Medical History:  Diagnosis Date   Arthritis    Hip pain    Renal calculi    left   Sepsis (HCC) 06/13/2023    Past Surgical History:  Procedure Laterality Date   ANTERIOR CERVICAL DECOMP/DISCECTOMY FUSION N/A 06/05/2023   Procedure: ANTERIOR CERVICAL DISCECTOMY AND FUSION CERVICAL SIX-SEVEN/CERVICAL SEVEN-THORACIC ONE;  Surgeon: Carollee Lani BROCKS, DO;  Location: [facility] OR;  Service: Neurosurgery;  Laterality:  N/A;  3C   APPENDECTOMY     CIRCUMCISION     HIATAL HERNIA REPAIR     HIP SURGERY     at 39 and 58 yo   POSTERIOR CERVICAL FUSION/FORAMINOTOMY N/A 07/12/2023   Procedure: POSTERIOR CERVICAL LAMINECTOMY, INSTRUMENTATION AND FUSION CERVICAL SIX-CERVICAL SEVEN, CERVICAL SEVEN-THORACIC ONE;  Surgeon: Dawley, Lani BROCKS, DO;  Location: [facility] OR;  Service: Neurosurgery;  Laterality: N/A;     reports that he has been smoking cigars. He has never used smokeless tobacco. He reports that he does not currently use alcohol. He reports current drug use. Drug: Marijuana.  No Known Allergies  Family History  Family history unknown: Yes    Prior to Admission medications   Medication Sig Start Date End Date Taking? Authorizing Provider  acetaminophen  (TYLENOL ) 325 MG tablet Take 2 tablets (650 mg total) by mouth every 4 (four) hours as needed for fever or mild pain (pain score 1-3). 07/31/23   Setzer, Nena PARAS, PA-C  ascorbic acid  (VITAMIN C ) 1000 MG tablet Take 1 tablet (1,000 mg total) by mouth daily. 12/30/23   Lovorn, Megan, MD  baclofen  (LIORESAL ) 20 MG tablet Take 2 tablets (40 mg total) by mouth 4 (four) times daily. 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 12/04/23   Lovorn, Megan, MD  diazepam  (VALIUM ) 10 MG tablet Take 0.5-1 tablets (5-10 mg  total) by mouth every 6 (six) hours as needed for muscle spasms (severe spasticity). 12/04/23   Lovorn, Megan, MD  docusate sodium  (ENEMEEZ) 283 MG enema Place 1 enema (283 mg total) rectally daily as needed for severe constipation. 07/31/23   Setzer, Sandra J, PA-C  DULoxetine  (CYMBALTA ) 60 MG capsule Take 1 capsule (60 mg total) by mouth daily. 08/21/23     DULoxetine  (CYMBALTA ) 60 MG capsule Take 1 capsule (60 mg total) by mouth daily. 02/27/24     hydrocerin (EUCERIN) CREA Apply 1 Application topically 2 (two) times daily. 07/31/23   Setzer, Sandra J, PA-C   leptospermum manuka honey (MEDIHONEY) PSTE paste Apply 1 Application topically daily. 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 Remove & Discard patch within 12 hours or as directed by MD 08/01/23   Rosendo, Sandra J, PA-C  lidocaine  (LIDODERM ) 5 % Place 2 patches onto the skin daily. For chronic back pain- and hip pain- to reduce amount of opiates giving pt-Remove & Discard patch within 12 hours or as directed by MD 12/04/23   Lovorn, Megan, MD  lidocaine  (XYLOCAINE ) 2 % jelly Apply topically as needed (Use with in and out catheter). 08/01/23   Setzer, Sandra J, PA-C  morphine  (MSIR) 15 MG tablet Take 1 tablet (15 mg total) by mouth every 6 (six) hours as needed for severe pain (pain score 7-10). 01/02/24   Lovorn, Megan, MD  oxyCODONE  (ROXICODONE ) 15 MG immediate release tablet Take 1 tablet (15 mg total) by mouth every 6 (six) hours as needed for severe pain (pain score 7-10). 01/16/24   Lovorn, Megan, MD  polyethylene glycol powder (MIRALAX ) 17 GM/SCOOP powder Mix 17 g in 4 oz of liquid & take by mouth daily. 02/20/24   Lovorn, Megan, MD  QUEtiapine  (SEROQUEL ) 50 MG tablet Take 1 tablet (50 mg total) by mouth at bedtime. 10/28/23   Lovorn, Megan, MD  senna (SENOKOT) 8.6 MG tablet 2 tablets at bedtime as needed Orally Once a day    [provider]  tamsulosin  (FLOMAX ) 0.4 MG CAPS capsule Take 1 capsule (0.4 mg total) by mouth every evening. 12/30/23   Lovorn, Megan, MD  tiZANidine  (ZANAFLEX ) 2 MG tablet Take 1 tablet (2 mg total) by mouth 3 (three) times daily. 02/27/24     Wound Dressings ([employer] MANUKA HONEY WOUND) GEL apply to wound two times daily 09/18/23       Physical Exam: Vitals:   03/04/24 1615 03/04/24 1630 03/04/24 1645 03/04/24 1652  BP: 100/79 99/81 132/87   Pulse: (!) 102 (!) 102 (!) 102   Resp: 15 (!) 21 (!) 23   Temp:    99.8 F (37.7 C)  TempSrc:    Rectal  SpO2: 99% 98% 97%    Weight:      Height:        Constitutional: NAD, calm, comfortable Vitals:   03/04/24 1615 03/04/24 1630 03/04/24 1645 03/04/24 1652  BP: 100/79 99/81 132/87   Pulse: (!) 102 (!) 102 (!) 102   Resp: 15 (!) 21 (!) 23   Temp:    99.8 F (37.7 C)  TempSrc:    Rectal  SpO2: 99% 98% 97%   Weight:      Height:       Eyes: lids and conjunctivae normal ENMT: Mucous membranes are moist. Posterior pharynx clear of any exudate  or lesions.Normal dentition.  Neck: normal, supple, no masses, no thyromegaly Respiratory: clear to auscultation bilaterally, no wheezing, no crackles. Normal respiratory effort. No accessory muscle use.  Cardiovascular: distant heart tones, no murmurs / rubs / gallops appreciated. No extremity edema. 2+ pedal pulses.  Abdomen: no tenderness, no masses palpated. No hepatosplenomegaly. Bowel sounds positive.  Musculoskeletal: no clubbing / cyanosis. No joint deformity upper and lower extremities. Good ROM, no contractures. Normal muscle tone.  Skin: no rashes, lesions, ulcers. No induration Neurologic: CN 2-12 grossly intact. Sensation intact , quadriplegic, Psychiatric: Normal judgment and insight. Alert and oriented x 3. Normal mood.    Labs on Admission: I have personally reviewed following labs and imaging studies  CBC: Recent Labs  Lab 03/04/24 1610  WBC 16.4*  NEUTROABS 12.3*  HGB 13.1  HCT 37.5*  MCV 90.4  PLT 280   Basic Metabolic Panel: Recent Labs  Lab 03/04/24 1610  NA 132*  K 4.1  CL 100  CO2 22  GLUCOSE 143*  BUN 13  CREATININE 0.53*  CALCIUM 8.6*   GFR: Estimated Creatinine Clearance: 110.8 mL/min (A) (by C-G formula based on SCr of 0.53 mg/dL (L)). Liver Function Tests: Recent Labs  Lab 03/04/24 1610  AST 55*  ALT 42  ALKPHOS 129*  BILITOT 0.6  PROT 6.1*  ALBUMIN  2.7*   No results for input(s): LIPASE, AMYLASE in the last 168 hours. No results for input(s): AMMONIA in the last 168 hours. Coagulation Profile: No  results for input(s): INR, PROTIME in the last 168 hours. Cardiac Enzymes: Recent Labs  Lab 03/04/24 1610  CKTOTAL 24*   BNP (last 3 results) No results for input(s): PROBNP in the last 8760 hours. HbA1C: No results for input(s): HGBA1C in the last 72 hours. CBG: No results for input(s): GLUCAP in the last 168 hours. Lipid Profile: No results for input(s): CHOL, HDL, LDLCALC, TRIG, CHOLHDL, LDLDIRECT in the last 72 hours. Thyroid Function Tests: No results for input(s): TSH, T4TOTAL, FREET4, T3FREE, THYROIDAB in the last 72 hours. Anemia Panel: No results for input(s): VITAMINB12, FOLATE, FERRITIN, TIBC, IRON, RETICCTPCT in the last 72 hours. Urine analysis:    Component Value Date/Time   COLORURINE AMBER (A) 03/04/2024 1649   APPEARANCEUR CLEAR 03/04/2024 1649   LABSPEC 1.029 03/04/2024 1649   PHURINE 5.0 03/04/2024 1649   GLUCOSEU NEGATIVE 03/04/2024 1649   HGBUR NEGATIVE 03/04/2024 1649   BILIRUBINUR NEGATIVE 03/04/2024 1649   KETONESUR NEGATIVE 03/04/2024 1649   PROTEINUR 30 (A) 03/04/2024 1649   UROBILINOGEN 1.0 08/04/2009 2332   NITRITE NEGATIVE 03/04/2024 1649   LEUKOCYTESUR NEGATIVE 03/04/2024 1649    Radiological Exams on Admission: CT CHEST ABDOMEN PELVIS W CONTRAST Result Date: 03/04/2024 CLINICAL DATA:  Sepsis. Chest and abdominal pain. Shortness of breath, fatigue, weakness EXAM: CT CHEST, ABDOMEN, AND PELVIS WITH CONTRAST TECHNIQUE: Multidetector CT imaging of the chest, abdomen and pelvis was performed following the standard protocol during bolus administration of intravenous contrast. RADIATION DOSE REDUCTION: This exam was performed according to the departmental dose-optimization program which includes automated exposure control, adjustment of the mA and/or kV according to patient size and/or use of iterative reconstruction technique. CONTRAST:  75mL OMNIPAQUE  IOHEXOL  350 MG/ML SOLN COMPARISON:  06/13/2023 FINDINGS: CT  CHEST FINDINGS Cardiovascular: Moderate to large pericardial effusion. Heart is normal size. Aorta normal caliber. Scattered aortic calcifications. Mediastinum/Nodes: No mediastinal, hilar, or axillary adenopathy. Trachea and esophagus are unremarkable. Thyroid unremarkable. Lungs/Pleura: Small bilateral pleural effusions, left greater than right. Left basilar airspace opacity, favor  atelectasis although pneumonia cannot be completely excluded. Linear atelectasis in the right lung base. Musculoskeletal: Chest wall soft tissues are unremarkable. No acute bony abnormality. CT ABDOMEN PELVIS FINDINGS Hepatobiliary: No focal hepatic abnormality. Gallbladder unremarkable. Pancreas: No focal abnormality or ductal dilatation. Spleen: No focal abnormality.  Normal size. Adrenals/Urinary Tract: No adrenal abnormality. No focal renal abnormality. No stones or hydronephrosis. Urinary bladder is unremarkable. Stomach/Bowel: Moderate stool burden throughout the colon. Stomach, large and small bowel grossly unremarkable. Prior appendectomy. Vascular/Lymphatic: No evidence of aneurysm or adenopathy. Reproductive: No visible focal abnormality. Other: No free fluid or free air. Musculoskeletal: No acute bony abnormality. IMPRESSION: Moderate to large pericardial effusion, new since prior study. Small bilateral pleural effusions, left greater than right. Minimal right base atelectasis. Left basilar atelectasis or infiltrate. No acute findings in the abdomen or pelvis. Moderate stool burden throughout the colon. Electronically Signed   By: Franky Crease M.D.   On: 03/04/2024 19:09   DG Chest Port 1 View Result Date: 03/04/2024 CLINICAL DATA:  Fatigue and malaise. EXAM: PORTABLE CHEST 1 VIEW COMPARISON:  July 22, 2023 FINDINGS: The cardiac silhouette is borderline in size. This may be technical in origin. Mild atelectasis and/or infiltrate is seen within the left lung base. There is a small left pleural effusion. No pneumothorax  is identified. A stable, benign-appearing sclerotic lesion is seen within the neck of the proximal right humerus. Postoperative changes are present throughout the cervical spine. IMPRESSION: 1. Mild left basilar atelectasis and/or infiltrate. 2. Small left pleural effusion. Electronically Signed   By: Suzen Dials M.D.   On: 03/04/2024 15:55    EKG: Independently reviewed.   Assessment/Plan  Pericardial Effusion  -note to be hypotensive on admit  - patient s/p bolus notes repeat blood pressure  -crp/esr/echo pending  -start cholchine ,motrin   -await cardiology further rec   Probable CAP  presumed bacterial  - chest imaging with ? Infiltrate , noted elevated wbc  - start on broad spectrum abx  -f/u with RVP , blood cultures  -lactic 0.9 , procal  pending    Prediabetes -monitor poc   Quadriplegia secondary to chronic C6-T1 cervical myelopathy  -s/p ACDF 06/05/2023  -c/b spasticity specifically of the left lower extremity  - wheel chair bound  -followed by rehab medicine   Neurogenic bladder -on CIC  - hx of uti with sepsis in this setting  - UA currently unremarkable    Neurogenic bowel -continue on bowel regimen    DVT prophylaxis: heparin  Code Status: full/ as discussed per patient wishes in event of cardiac arrest  Family Communication:  Disposition Plan: patient  expected to be admitted greater than 2 midnights  Consults called: Cardiology  Admission status: progressive care   Camila DELENA Ned MD [facility]   If 7PM-7AM, please contact night-coverage [email or web address] Password [facility]  03/04/2024, 8:26 PM

## 2024-03-04 NOTE — Telephone Encounter (Signed)
 Mrs Duggar called to report that Mr Puff is urinating but only small amounts (~100cc) and it is very dark. She reports that he is sweating a lot too. He is drinking fluids. Please advise.

## 2024-03-04 NOTE — Consult Note (Signed)
 Cardiology Consultation   Patient ID: ORY ELTING MRN: [id number]; DOB: 1965/06/27  Admit date: 03/04/2024 Date of Consult: 03/04/2024  PCP:  [name], MD   [facility] Providers Cardiologist:  New Click here to update MD or APP on Care Team, Refresh:1}    CC: Generalized fatigue, malaise, weakness Reason of Consult: Pericardial effusion  Patient Profile: Leon Rodriguez is a 58 y.o. male with a hx of cervical myelopathy with spasticity, neurogenic bowel and bladder, arthritis, renal calculi, tobacco/THC use, prior sepsis who is being seen 03/04/2024 for the evaluation of pericardial effusion at the request of Dr. Dean.  History of Present Illness: Mr. Bledsoe presented to ED with generalized fatigue, malaise, weakness, and body aches. He had been urinating only a small amount of dark urine recently. He had been having night sweats. Family brought him to ED for concern for infection. He is mildly tachycardic in the low 100s with initial SBP 83/71. He received 1L LR and 1L NS with improvement in BP to 132/87. Blood cultures are pending. TMax 99.89F in ED, WBC 16.4 with increased neutrophils, Na 129, albumin  2.7, CK 24, lactate WNL, UA with 30 protein and rare bacteria. CXR shows L atx vs infiltrate, small left pleural effusion. CT c/a/p showed moderate to large pericardial effusion, small pleural effusions L>R, left base atx vs infiltrate, moderate stool burden. EKG shows sinus tach 103bpm, borderline lower voltage, minimal STE I, avL not meeting STEMI criteria (similar findings seen 2024), no acute changes otherwise. He has not had any chest pain, SOB or syncope.   At the time of evaluation patient is accompanied by his girlfriend Dolores at bedside.  Patient provides verbal consent with having her present during today's encounter.  Patient denies anginal chest pain at rest.  Due to underlining myelopathy and spasticity he is nonambulatory. He denies orthopnea, PND,  or lower extremity swelling.  He has been in his usual state of health with the exception of feeling more tired, fatigued, and has been noticing reduced urine output despite increasing fluid intake.  And that is why he came to the ED for further evaluation and management.  He denies fevers or chills or sick contacts.  Denies any prior history of myocardial infarction, CAD, DVT/PE, CVA/TIA.  Past Medical History:  Diagnosis Date   Arthritis    Hip pain    Renal calculi    left   Sepsis (HCC) 06/13/2023    Past Surgical History:  Procedure Laterality Date   ANTERIOR CERVICAL DECOMP/DISCECTOMY FUSION N/A 06/05/2023   Procedure: ANTERIOR CERVICAL DISCECTOMY AND FUSION CERVICAL SIX-SEVEN/CERVICAL SEVEN-THORACIC ONE;  Surgeon: Carollee Lani BROCKS, DO;  Location: [facility] OR;  Service: Neurosurgery;  Laterality: N/A;  3C   APPENDECTOMY     CIRCUMCISION     HIATAL HERNIA REPAIR     HIP SURGERY     at 41 and 58 yo   POSTERIOR CERVICAL FUSION/FORAMINOTOMY N/A 07/12/2023   Procedure: POSTERIOR CERVICAL LAMINECTOMY, INSTRUMENTATION AND FUSION CERVICAL SIX-CERVICAL SEVEN, CERVICAL SEVEN-THORACIC ONE;  Surgeon: Dawley, Lani BROCKS, DO;  Location: [facility] OR;  Service: Neurosurgery;  Laterality: N/A;     Home Medications:  Prior to Admission medications   Medication Sig Start Date End Date Taking? Authorizing Provider  acetaminophen  (TYLENOL ) 325 MG tablet Take 2 tablets (650 mg total) by mouth every 4 (four) hours as needed for fever or mild pain (pain score 1-3). 07/31/23   Setzer, Nena PARAS, PA-C  ascorbic acid  (VITAMIN C ) 1000 MG tablet Take  1 tablet (1,000 mg total) by mouth daily. 12/30/23   Lovorn, Megan, MD  baclofen  (LIORESAL ) 20 MG tablet Take 2 tablets (40 mg total) by mouth 4 (four) times daily. 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 12/04/23   Lovorn, Megan, MD  diazepam  (VALIUM ) 10 MG tablet Take  0.5-1 tablets (5-10 mg total) by mouth every 6 (six) hours as needed for muscle spasms (severe spasticity). 12/04/23   Lovorn, Megan, MD  docusate sodium  (ENEMEEZ) 283 MG enema Place 1 enema (283 mg total) rectally daily as needed for severe constipation. 07/31/23   Setzer, Sandra J, PA-C  DULoxetine  (CYMBALTA ) 60 MG capsule Take 1 capsule (60 mg total) by mouth daily. 08/21/23     DULoxetine  (CYMBALTA ) 60 MG capsule Take 1 capsule (60 mg total) by mouth daily. 02/27/24     hydrocerin (EUCERIN) CREA Apply 1 Application topically 2 (two) times daily. 07/31/23   Setzer, Sandra J, PA-C  leptospermum manuka honey (MEDIHONEY) PSTE paste Apply 1 Application topically daily. 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 Remove & Discard patch within 12 hours or as directed by MD 08/01/23   Rosendo, Sandra J, PA-C  lidocaine  (LIDODERM ) 5 % Place 2 patches onto the skin daily. For chronic back pain- and hip pain- to reduce amount of opiates giving pt-Remove & Discard patch within 12 hours or as directed by MD 12/04/23   Lovorn, Megan, MD  lidocaine  (XYLOCAINE ) 2 % jelly Apply topically as needed (Use with in and out catheter). 08/01/23   Setzer, Sandra J, PA-C  morphine  (MSIR) 15 MG tablet Take 1 tablet (15 mg total) by mouth every 6 (six) hours as needed for severe pain (pain score 7-10). 01/02/24   Lovorn, Megan, MD  oxyCODONE  (ROXICODONE ) 15 MG immediate release tablet Take 1 tablet (15 mg total) by mouth every 6 (six) hours as needed for severe pain (pain score 7-10). 01/16/24   Lovorn, Megan, MD  polyethylene glycol powder (MIRALAX ) 17 GM/SCOOP powder Mix 17 g in 4 oz of liquid & take by mouth daily. 02/20/24   Lovorn, Megan, MD  QUEtiapine  (SEROQUEL ) 50 MG tablet Take 1 tablet (50 mg total) by mouth at bedtime. 10/28/23   Lovorn, Megan, MD  senna (SENOKOT) 8.6 MG tablet 2 tablets at bedtime as needed Orally Once a day     [provider]  tamsulosin  (FLOMAX ) 0.4 MG CAPS capsule Take 1 capsule (0.4 mg total) by mouth every evening. 12/30/23   Lovorn, Megan, MD  tiZANidine  (ZANAFLEX ) 2 MG tablet Take 1 tablet (2 mg total) by mouth 3 (three) times daily. 02/27/24     Wound Dressings ([employer] MANUKA HONEY WOUND) GEL apply to wound two times daily 09/18/23      Allergies:   No Known Allergies  Social History:   Social History   Socioeconomic History   Marital status: Married    Spouse name: Not on file   Number of children: Not on file   Years of education: Not on file   Highest education level: Not on file  Occupational History   Not on file  Tobacco Use   Smoking status: Every Day    Types: Cigars   Smokeless tobacco: Never  Vaping Use  Vaping status: Never Used  Substance and Sexual Activity   Alcohol use: Not Currently   Drug use: Yes    Types: Marijuana   Sexual activity: Not on file  Other Topics Concern   Not on file  Social History Narrative   Not on file   Social Drivers of Health   Financial Resource Strain: Not on file  Food Insecurity: Unknown (07/14/2023)   Hunger Vital Sign    Worried About Running Out of Food in the Last Year: Patient unable to answer    Ran Out of Food in the Last Year: Never true  Transportation Needs: No Transportation Needs (07/14/2023)   PRAPARE - Administrator, Civil Service (Medical): No    Lack of Transportation (Non-Medical): No  Physical Activity: Not on file  Stress: Not on file  Social Connections: Not on file  Intimate Partner Violence: Unknown (07/14/2023)   Humiliation, Afraid, Rape, and Kick questionnaire    Fear of Current or Ex-Partner: Patient declined    Emotionally Abused: No    Physically Abused: No    Sexually Abused: No    Family History:    Family History  Family history unknown: Yes     ROS:  Review of Systems  Constitutional: Positive for malaise/fatigue and night sweats.  Cardiovascular:  Negative for chest  pain, claudication, irregular heartbeat, leg swelling, near-syncope, orthopnea, palpitations, paroxysmal nocturnal dyspnea and syncope.  Respiratory:  Negative for shortness of breath.   Hematologic/Lymphatic: Negative for bleeding problem.  Musculoskeletal:  Positive for stiffness.       Lower extremities spasms      Physical Exam/Data: Vitals:   03/04/24 1615 03/04/24 1630 03/04/24 1645 03/04/24 1652  BP: 100/79 99/81 132/87   Pulse: (!) 102 (!) 102 (!) 102   Resp: 15 (!) 21 (!) 23   Temp:    99.8 F (37.7 C)  TempSrc:    Rectal  SpO2: 99% 98% 97%   Weight:      Height:        Intake/Output Summary (Last 24 hours) at 03/04/2024 2037 Last data filed at 03/04/2024 1722 Gross per 24 hour  Intake 1000 ml  Output --  Net 1000 ml      03/04/2024    3:20 PM 01/20/2024    2:55 PM 01/08/2024    1:32 PM  Last 3 Weights  Weight (lbs) 200 lb 9.9 oz -- --  Weight (kg) 91 kg -- --     Body mass index is 29.63 kg/m.  Physical Exam  Constitutional: No distress. He appears chronically ill.  hemodynamically stable  HENT:  Dry mucous membranes, poor dentition  Neck: No JVD present.  Cardiovascular: Regular rhythm, S1 normal and S2 normal. Tachycardia present. Exam reveals no gallop, no S3 and no S4.  No murmur heard. Pulses:      Radial pulses are 2+ on the right side and 2+ on the left side.  Unable to evaluate lower extremity pulses due to significant spasm/tenderness to touch  Pulmonary/Chest: Effort normal. No stridor. He has no wheezes. He has no rales.  Unable to sit upright, lung sounds clear to auscultation laterally and anteriorly  Abdominal: Soft. He exhibits no distension. There is no abdominal tenderness.  Musculoskeletal:        General: No edema.     Cervical back: Neck supple.  Skin: Skin is dry.   EKG:  The EKG was personally reviewed and demonstrates:  EKG shows sinus tach 103bpm, borderline lower  voltage,  no acute changes otherwise. Telemetry:  Telemetry was  personally reviewed and demonstrates:  low level sinus tach  Relevant CV Studies: Pending echo tomorrow  Laboratory Data: High Sensitivity Troponin:  No results for input(s): TROPONINIHS in the last 720 hours.   Chemistry Recent Labs  Lab 03/04/24 1610  NA 132*  K 4.1  CL 100  CO2 22  GLUCOSE 143*  BUN 13  CREATININE 0.53*  CALCIUM 8.6*  GFRNONAA >60  ANIONGAP 10    Recent Labs  Lab 03/04/24 1610  PROT 6.1*  ALBUMIN  2.7*  AST 55*  ALT 42  ALKPHOS 129*  BILITOT 0.6   Lipids No results for input(s): CHOL, TRIG, HDL, LABVLDL, LDLCALC, CHOLHDL in the last 168 hours.  Hematology Recent Labs  Lab 03/04/24 1610  WBC 16.4*  RBC 4.15*  HGB 13.1  HCT 37.5*  MCV 90.4  MCH 31.6  MCHC 34.9  RDW 13.6  PLT 280   Radiology/Studies:  CT CHEST ABDOMEN PELVIS W CONTRAST Result Date: 03/04/2024 CLINICAL DATA:  Sepsis. Chest and abdominal pain. Shortness of breath, fatigue, weakness EXAM: CT CHEST, ABDOMEN, AND PELVIS WITH CONTRAST TECHNIQUE: Multidetector CT imaging of the chest, abdomen and pelvis was performed following the standard protocol during bolus administration of intravenous contrast. RADIATION DOSE REDUCTION: This exam was performed according to the departmental dose-optimization program which includes automated exposure control, adjustment of the mA and/or kV according to patient size and/or use of iterative reconstruction technique. CONTRAST:  75mL OMNIPAQUE  IOHEXOL  350 MG/ML SOLN COMPARISON:  06/13/2023 FINDINGS: CT CHEST FINDINGS Cardiovascular: Moderate to large pericardial effusion. Heart is normal size. Aorta normal caliber. Scattered aortic calcifications. Mediastinum/Nodes: No mediastinal, hilar, or axillary adenopathy. Trachea and esophagus are unremarkable. Thyroid unremarkable. Lungs/Pleura: Small bilateral pleural effusions, left greater than right. Left basilar airspace opacity, favor atelectasis although pneumonia cannot be completely excluded.  Linear atelectasis in the right lung base. Musculoskeletal: Chest wall soft tissues are unremarkable. No acute bony abnormality. CT ABDOMEN PELVIS FINDINGS Hepatobiliary: No focal hepatic abnormality. Gallbladder unremarkable. Pancreas: No focal abnormality or ductal dilatation. Spleen: No focal abnormality.  Normal size. Adrenals/Urinary Tract: No adrenal abnormality. No focal renal abnormality. No stones or hydronephrosis. Urinary bladder is unremarkable. Stomach/Bowel: Moderate stool burden throughout the colon. Stomach, large and small bowel grossly unremarkable. Prior appendectomy. Vascular/Lymphatic: No evidence of aneurysm or adenopathy. Reproductive: No visible focal abnormality. Other: No free fluid or free air. Musculoskeletal: No acute bony abnormality. IMPRESSION: Moderate to large pericardial effusion, new since prior study. Small bilateral pleural effusions, left greater than right. Minimal right base atelectasis. Left basilar atelectasis or infiltrate. No acute findings in the abdomen or pelvis. Moderate stool burden throughout the colon. Electronically Signed   By: Franky Crease M.D.   On: 03/04/2024 19:09   DG Chest Port 1 View Result Date: 03/04/2024 CLINICAL DATA:  Fatigue and malaise. EXAM: PORTABLE CHEST 1 VIEW COMPARISON:  July 22, 2023 FINDINGS: The cardiac silhouette is borderline in size. This may be technical in origin. Mild atelectasis and/or infiltrate is seen within the left lung base. There is a small left pleural effusion. No pneumothorax is identified. A stable, benign-appearing sclerotic lesion is seen within the neck of the proximal right humerus. Postoperative changes are present throughout the cervical spine. IMPRESSION: 1. Mild left basilar atelectasis and/or infiltrate. 2. Small left pleural effusion. Electronically Signed   By: Suzen Dials M.D.   On: 03/04/2024 15:55     Assessment and Plan:  Moderate to large pericardial effusion  Noted incidentally on CT,  ordered for infectious etiology by ED provider. Consult requested by ED provider with regards to the need for echocardiogram prior to or after discharge.   At the time of the evaluation the likelihood of tamponade physiology is low.   Both tachycardia and blood pressures have improved with IV hydration. No JVP on physical examination. No pulses alternans on EKG Independently reviewed the CT pericardial effusion is likely large predominantly anterior to the RA RV.  This was not present on his former CT back in 2024. Full echocardiogram in the morning. If there is change in hemodynamics overnight recommend paging cardiology as per [facility] and if clinically appropriate stat echocardiogram can be obtained.  Overnight team aware of the consult. Recommend full echocardiogram in the morning to evaluate for pericardial effusion/tamponade physiology  Check ESR CRP   Leukocytosis/soft blood pressures/feeling tired, fatigued  Rule out infectious etiology referred to primary team  Sepsis workup pending Chest x-ray left basilar atelectasis and/or infiltrate  History of cervical myelopathy status post neurogenic bowel and bladder management per primary team   Cigar smoking, marijuana: Reemphasized importance of complete cessation   Electrolyte abnormalities-management per primary team   Risk Assessment/Risk Scores:      For questions or updates, please contact [facility] Please consult [email or web address] for contact info under    Signed, Madonna Large, DO, [facility]  A Division of [facility] [address] [place], [place] [address]  8:37 PM

## 2024-03-04 NOTE — ED Provider Notes (Signed)
 [facility] AT [facility] Provider Note   CSN: [id number] Arrival date & time: 03/04/24  1513     Patient presents with: Fatigue and Weakness   Leon Rodriguez is a 58 y.o. male.   Pt is a 58 yo male with pmhx significant for cervical myelopathy s/p  s/p ACDF and PCDF C6-T1 by Dr Carollee- in January 2025 with neurogenic bladder and bowel.  He is bed bound.  His wife noticed that his urine seemed dark, so she was concerned for infection.  Pt denies fevers at home.  No new pain.         Prior to Admission medications   Medication Sig Start Date End Date Taking? Authorizing Provider  acetaminophen  (TYLENOL ) 325 MG tablet Take 2 tablets (650 mg total) by mouth every 4 (four) hours as needed for fever or mild pain (pain score 1-3). 07/31/23   Setzer, Nena PARAS, PA-C  ascorbic acid  (VITAMIN C ) 1000 MG tablet Take 1 tablet (1,000 mg total) by mouth daily. 12/30/23   Lovorn, Megan, MD  baclofen  (LIORESAL ) 20 MG tablet Take 2 tablets (40 mg total) by mouth 4 (four) times daily. 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 12/04/23   Lovorn, Megan, MD  diazepam  (VALIUM ) 10 MG tablet Take 0.5-1 tablets (5-10 mg total) by mouth every 6 (six) hours as needed for muscle spasms (severe spasticity). 12/04/23   Lovorn, Megan, MD  docusate sodium  (ENEMEEZ) 283 MG enema Place 1 enema (283 mg total) rectally daily as needed for severe constipation. 07/31/23   Setzer, Sandra J, PA-C  DULoxetine  (CYMBALTA ) 60 MG capsule Take 1 capsule (60 mg total) by mouth daily. 08/21/23     DULoxetine  (CYMBALTA ) 60 MG capsule Take 1 capsule (60 mg total) by mouth daily. 02/27/24     hydrocerin (EUCERIN) CREA Apply 1 Application topically 2 (two) times daily. 07/31/23   Setzer, Sandra J, PA-C  leptospermum manuka honey (MEDIHONEY) PSTE paste Apply 1 Application topically daily. 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 Remove & Discard patch within 12 hours or as directed by MD 08/01/23   Rosendo, Sandra J, PA-C  lidocaine  (LIDODERM ) 5 % Place 2 patches onto the skin daily. For chronic back pain- and hip pain- to reduce amount of opiates giving pt-Remove & Discard patch within 12 hours or as directed by MD 12/04/23   Lovorn, Megan, MD  lidocaine  (XYLOCAINE ) 2 % jelly Apply topically as needed (Use with in and out catheter). 08/01/23   Setzer, Sandra J, PA-C  morphine  (MSIR) 15 MG tablet Take 1 tablet (15 mg total) by mouth every 6 (six) hours as needed for severe pain (pain score 7-10). 01/02/24   Lovorn, Megan, MD  oxyCODONE  (ROXICODONE ) 15 MG immediate release tablet Take 1 tablet (15 mg total) by mouth every 6 (six) hours as needed for severe pain (pain score 7-10). 01/16/24   Lovorn, Megan, MD  polyethylene glycol powder (MIRALAX ) 17 GM/SCOOP powder Mix 17 g in 4 oz of liquid & take by mouth daily. 02/20/24   Lovorn, Megan, MD  QUEtiapine  (SEROQUEL ) 50 MG tablet Take 1 tablet (50 mg total) by mouth at bedtime. 10/28/23   Lovorn, Megan, MD  senna (  SENOKOT) 8.6 MG tablet 2 tablets at bedtime as needed Orally Once a day    [provider]  tamsulosin  (FLOMAX ) 0.4 MG CAPS capsule Take 1 capsule (0.4 mg total) by mouth every evening. 12/30/23   Lovorn, Megan, MD  tiZANidine  (ZANAFLEX ) 2 MG tablet Take 1 tablet (2 mg total) by mouth 3 (three) times daily. 02/27/24     Wound Dressings ([employer] HONEY WOUND) GEL apply to wound two times daily 09/18/23       Allergies: Patient has no known allergies.    Review of Systems  Neurological:  Positive for weakness.  All other systems reviewed and are negative.   Updated Vital Signs BP 132/87   Pulse (!) 102   Temp 99.8 F (37.7 C) (Rectal)   Resp (!) 23   Ht 5' 9 (1.753 m)   Wt 91 kg   SpO2 97%   BMI 29.63 kg/m   Physical Exam Vitals  and nursing note reviewed.  Constitutional:      Appearance: Normal appearance.  HENT:     Head: Normocephalic and atraumatic.     Right Ear: External ear normal.     Left Ear: External ear normal.     Nose: Nose normal.     Mouth/Throat:     Mouth: Mucous membranes are moist.     Pharynx: Oropharynx is clear.  Eyes:     Extraocular Movements: Extraocular movements intact.     Conjunctiva/sclera: Conjunctivae normal.     Pupils: Pupils are equal, round, and reactive to light.  Cardiovascular:     Rate and Rhythm: Regular rhythm. Tachycardia present.     Pulses: Normal pulses.     Heart sounds: Normal heart sounds.  Pulmonary:     Effort: Pulmonary effort is normal.     Breath sounds: Normal breath sounds.  Abdominal:     General: Abdomen is flat. Bowel sounds are normal.     Palpations: Abdomen is soft.  Musculoskeletal:        General: Normal range of motion.     Cervical back: Normal range of motion and neck supple.  Skin:    General: Skin is warm.     Capillary Refill: Capillary refill takes less than 2 seconds.  Neurological:     Mental Status: He is alert.     Comments: Spastic quadriplegia  Psychiatric:        Mood and Affect: Mood normal.     (all labs ordered are listed, but only abnormal results are displayed) Labs Reviewed  COMPREHENSIVE METABOLIC PANEL WITH GFR - Abnormal; Notable for the following components:      Result Value   Sodium 132 (*)    Glucose, Bld 143 (*)    Creatinine, Ser 0.53 (*)    Calcium 8.6 (*)    Total Protein 6.1 (*)    Albumin  2.7 (*)    AST 55 (*)    Alkaline Phosphatase 129 (*)    All other components within normal limits  CBC WITH DIFFERENTIAL/PLATELET - Abnormal; Notable for the following components:   WBC 16.4 (*)    RBC 4.15 (*)    HCT 37.5 (*)    Neutro Abs 12.3 (*)    Monocytes Absolute 1.8 (*)    Abs Immature Granulocytes 0.09 (*)    All other components within normal limits  URINALYSIS, W/ REFLEX TO CULTURE  (INFECTION SUSPECTED) - Abnormal; Notable for the following components:   Color, Urine AMBER (*)    Protein,  ur 30 (*)    Bacteria, UA RARE (*)    All other components within normal limits  CK - Abnormal; Notable for the following components:   Total CK 24 (*)    All other components within normal limits  CULTURE, BLOOD (ROUTINE X 2)  CULTURE, BLOOD (ROUTINE X 2)  RESP PANEL BY RT-PCR (RSV, FLU A&B, COVID)  RVPGX2  PROTIME-INR  I-STAT CG4 LACTIC ACID, ED  I-STAT CG4 LACTIC ACID, ED    EKG: EKG Interpretation Date/Time:  Wednesday March 04 2024 15:24:50 EDT Ventricular Rate:  103 PR Interval:  149 QRS Duration:  86 QT Interval:  302 QTC Calculation: 396 R Axis:   13  Text Interpretation: Sinus tachycardia Low voltage, precordial leads Minimal ST elevation, inferior leads No significant change since last tracing Confirmed by Dean Clarity [phone]) on 03/04/2024 3:47:28 PM  Radiology: CT CHEST ABDOMEN PELVIS W CONTRAST Result Date: 03/04/2024 CLINICAL DATA:  Sepsis. Chest and abdominal pain. Shortness of breath, fatigue, weakness EXAM: CT CHEST, ABDOMEN, AND PELVIS WITH CONTRAST TECHNIQUE: Multidetector CT imaging of the chest, abdomen and pelvis was performed following the standard protocol during bolus administration of intravenous contrast. RADIATION DOSE REDUCTION: This exam was performed according to the departmental dose-optimization program which includes automated exposure control, adjustment of the mA and/or kV according to patient size and/or use of iterative reconstruction technique. CONTRAST:  75mL OMNIPAQUE  IOHEXOL  350 MG/ML SOLN COMPARISON:  06/13/2023 FINDINGS: CT CHEST FINDINGS Cardiovascular: Moderate to large pericardial effusion. Heart is normal size. Aorta normal caliber. Scattered aortic calcifications. Mediastinum/Nodes: No mediastinal, hilar, or axillary adenopathy. Trachea and esophagus are unremarkable. Thyroid unremarkable. Lungs/Pleura: Small bilateral  pleural effusions, left greater than right. Left basilar airspace opacity, favor atelectasis although pneumonia cannot be completely excluded. Linear atelectasis in the right lung base. Musculoskeletal: Chest wall soft tissues are unremarkable. No acute bony abnormality. CT ABDOMEN PELVIS FINDINGS Hepatobiliary: No focal hepatic abnormality. Gallbladder unremarkable. Pancreas: No focal abnormality or ductal dilatation. Spleen: No focal abnormality.  Normal size. Adrenals/Urinary Tract: No adrenal abnormality. No focal renal abnormality. No stones or hydronephrosis. Urinary bladder is unremarkable. Stomach/Bowel: Moderate stool burden throughout the colon. Stomach, large and small bowel grossly unremarkable. Prior appendectomy. Vascular/Lymphatic: No evidence of aneurysm or adenopathy. Reproductive: No visible focal abnormality. Other: No free fluid or free air. Musculoskeletal: No acute bony abnormality. IMPRESSION: Moderate to large pericardial effusion, new since prior study. Small bilateral pleural effusions, left greater than right. Minimal right base atelectasis. Left basilar atelectasis or infiltrate. No acute findings in the abdomen or pelvis. Moderate stool burden throughout the colon. Electronically Signed   By: Franky Crease M.D.   On: 03/04/2024 19:09   DG Chest Port 1 View Result Date: 03/04/2024 CLINICAL DATA:  Fatigue and malaise. EXAM: PORTABLE CHEST 1 VIEW COMPARISON:  July 22, 2023 FINDINGS: The cardiac silhouette is borderline in size. This may be technical in origin. Mild atelectasis and/or infiltrate is seen within the left lung base. There is a small left pleural effusion. No pneumothorax is identified. A stable, benign-appearing sclerotic lesion is seen within the neck of the proximal right humerus. Postoperative changes are present throughout the cervical spine. IMPRESSION: 1. Mild left basilar atelectasis and/or infiltrate. 2. Small left pleural effusion. Electronically Signed   By:  Suzen Dials M.D.   On: 03/04/2024 15:55     Procedures   Medications Ordered in the ED  lactated ringers  bolus 1,000 mL (0 mLs Intravenous Stopped 03/04/24 1722)  sodium chloride  0.9 % bolus  1,000 mL (1,000 mLs Intravenous New Bag/Given 03/04/24 1932)  morphine  (PF) 4 MG/ML injection 4 mg (4 mg Intravenous Given 03/04/24 1934)  ondansetron  (ZOFRAN ) injection 4 mg (4 mg Intravenous Given 03/04/24 1933)  iohexol  (OMNIPAQUE ) 350 MG/ML injection 75 mL (75 mLs Intravenous Contrast Given 03/04/24 1850)                                    Medical Decision Making Amount and/or Complexity of Data Reviewed Labs: ordered. Radiology: ordered.  Risk Prescription drug management. Decision regarding hospitalization.   This patient presents to the ED for concern of sepsis, this involves an extensive number of treatment options, and is a complaint that carries with it a high risk of complications and morbidity.  The differential diagnosis includes uti, dehydration, rhabdo   Co morbidities that complicate the patient evaluation  cervical myelopathy s/p  s/p ACDF and PCDF C6-T1 by Dr Carollee- in January 2025 with neurogenic bladder and bowel   Additional history obtained:  Additional history obtained from epic chart review External records from outside source obtained and reviewed including EMS report   Lab Tests:  I Ordered, and personally interpreted labs.  The pertinent results include:  cbc with wbc elevated at 16.4; cmp with glucose elevated at 143, albumin  low at 2.7, ck 24, lactic 1.1   Imaging Studies ordered:  I ordered imaging studies including cxr  I independently visualized and interpreted imaging which showed  1. Mild left basilar atelectasis and/or infiltrate.  2. Small left pleural effusion.  CT chest/abd/pelvis: Moderate to large pericardial effusion, new since prior study.    Small bilateral pleural effusions, left greater than right. Minimal  right base  atelectasis. Left basilar atelectasis or infiltrate.    No acute findings in the abdomen or pelvis.    Moderate stool burden throughout the colon.   I agree with the radiologist interpretation   Cardiac Monitoring:  The patient was maintained on a cardiac monitor.  I personally viewed and interpreted the cardiac monitored which showed an underlying rhythm of: st   Medicines ordered and prescription drug management:  I ordered medication including ivfs  for sx  Reevaluation of the patient after these medicines showed that the patient improved I have reviewed the patients home medicines and have made adjustments as needed   Test Considered:  ct   Critical Interventions:  ivf   Consultations Obtained:  I requested consultation with the cardiologist (Dr. Michele), and discussed lab and imaging findings as well as pertinent plan -he recommends admission to medicine.  He will see in consult in am. Pt d/w Dr. Debby (triad) for admission.   Problem List / ED Course:  Pericardial effusion:  unclear why he has this effusion.  Pt d/w cards who recommends admission and they will see him in the am and get an ECHO.   Reevaluation:  After the interventions noted above, I reevaluated the patient and found that they have :improved   Social Determinants of Health:  Lives at home   Dispostion:  After consideration of the diagnostic results and the patients response to treatment, I feel that the patent would benefit from admission.       Final diagnoses:  Pericardial effusion    ED Discharge Orders     None          Dean Clarity, MD 03/04/24 2002

## 2024-03-04 NOTE — ED Triage Notes (Signed)
 Pt BIB EMS from home with CC of generalized fatigue, weakness and malaise. Denies fevers. States bilateral lower extremity pain. Pt is bed bound at home from hx of cervical disease x 2 years

## 2024-03-05 ENCOUNTER — Encounter (HOSPITAL_COMMUNITY): Payer: Self-pay | Admitting: Internal Medicine

## 2024-03-05 ENCOUNTER — Other Ambulatory Visit (HOSPITAL_COMMUNITY)

## 2024-03-05 ENCOUNTER — Inpatient Hospital Stay (HOSPITAL_COMMUNITY)

## 2024-03-05 DIAGNOSIS — R0789 Other chest pain: Secondary | ICD-10-CM

## 2024-03-05 DIAGNOSIS — I3139 Other pericardial effusion (noninflammatory): Secondary | ICD-10-CM | POA: Diagnosis not present

## 2024-03-05 DIAGNOSIS — I300 Acute nonspecific idiopathic pericarditis: Secondary | ICD-10-CM | POA: Diagnosis not present

## 2024-03-05 DIAGNOSIS — I4891 Unspecified atrial fibrillation: Secondary | ICD-10-CM

## 2024-03-05 LAB — COMPREHENSIVE METABOLIC PANEL WITH GFR
ALT: 44 U/L (ref 0–44)
AST: 45 U/L — ABNORMAL HIGH (ref 15–41)
Albumin: 2.5 g/dL — ABNORMAL LOW (ref 3.5–5.0)
Alkaline Phosphatase: 121 U/L (ref 38–126)
Anion gap: 12 (ref 5–15)
BUN: 9 mg/dL (ref 6–20)
CO2: 22 mmol/L (ref 22–32)
Calcium: 8.7 mg/dL — ABNORMAL LOW (ref 8.9–10.3)
Chloride: 101 mmol/L (ref 98–111)
Creatinine, Ser: 0.63 mg/dL (ref 0.61–1.24)
GFR, Estimated: >60 mL/min (ref >60)
Glucose, Bld: 117 mg/dL — ABNORMAL HIGH (ref 70–99)
Potassium: 4.1 mmol/L (ref 3.5–5.1)
Sodium: 135 mmol/L (ref 135–145)
Total Bilirubin: 1 mg/dL (ref 0.0–1.2)
Total Protein: 6.2 g/dL — ABNORMAL LOW (ref 6.5–8.1)

## 2024-03-05 LAB — RESPIRATORY PANEL BY PCR

## 2024-03-05 LAB — CBC
HCT: 34.7 % — ABNORMAL LOW (ref 39.0–52.0)
Hemoglobin: 12.3 g/dL — ABNORMAL LOW (ref 13.0–17.0)
MCH: 31.9 pg (ref 26.0–34.0)
MCHC: 35.4 g/dL (ref 30.0–36.0)
MCV: 89.9 fL (ref 80.0–100.0)
Platelets: 289 K/uL (ref 150–400)
RBC: 3.86 MIL/uL — ABNORMAL LOW (ref 4.22–5.81)
RDW: 13.8 % (ref 11.5–15.5)
WBC: 16.4 K/uL — ABNORMAL HIGH (ref 4.0–10.5)
nRBC: 0 % (ref 0.0–0.2)

## 2024-03-05 LAB — ECHOCARDIOGRAM COMPLETE
AR max vel: 3.23 cm2
AV Area VTI: 4 cm2
AV Area mean vel: 3.61 cm2
AV Mean grad: 4 mmHg
AV Peak grad: 7.4 mmHg
Ao pk vel: 1.36 m/s
Area-P 1/2: 6.48 cm2
Height: 69 in
S' Lateral: 2.6 cm
Weight: 3209.9 [oz_av]

## 2024-03-05 LAB — STREP PNEUMONIAE URINARY ANTIGEN: Strep Pneumo Urinary Antigen: NEGATIVE

## 2024-03-05 LAB — GLUCOSE, CAPILLARY
Glucose-Capillary: 129 mg/dL — ABNORMAL HIGH (ref 70–99)
Glucose-Capillary: 153 mg/dL — ABNORMAL HIGH (ref 70–99)
Glucose-Capillary: 153 mg/dL — ABNORMAL HIGH (ref 70–99)

## 2024-03-05 LAB — TROPONIN I (HIGH SENSITIVITY)
Troponin I (High Sensitivity): 6 ng/L (ref <18)
Troponin I (High Sensitivity): 8 ng/L

## 2024-03-05 LAB — MRSA NEXT GEN BY PCR, NASAL: MRSA by PCR Next Gen: NOT DETECTED

## 2024-03-05 MED ORDER — DULOXETINE HCL 60 MG PO CPEP
60.0000 mg | ORAL_CAPSULE | Freq: Every day | ORAL | Status: DC
  Administered 2024-03-05 – 2024-03-09 (×5): 60 mg via ORAL
  Filled 2024-03-05 (×2): qty 1
  Filled 2024-03-05: qty 2
  Filled 2024-03-05 (×3): qty 1

## 2024-03-05 MED ORDER — OXYCODONE HCL 5 MG PO TABS
15.0000 mg | ORAL_TABLET | Freq: Four times a day (QID) | ORAL | Status: DC | PRN
  Filled 2024-03-05: qty 3

## 2024-03-05 MED ORDER — MORPHINE SULFATE (PF) 2 MG/ML IV SOLN
2.0000 mg | INTRAVENOUS | Status: DC | PRN
  Administered 2024-03-05: 2 mg via INTRAVENOUS
  Filled 2024-03-05: qty 1

## 2024-03-05 MED ORDER — DANTROLENE SODIUM 100 MG PO CAPS
100.0000 mg | ORAL_CAPSULE | Freq: Three times a day (TID) | ORAL | Status: DC
  Administered 2024-03-05 – 2024-03-09 (×13): 100 mg via ORAL
  Filled 2024-03-05 (×16): qty 1

## 2024-03-05 MED ORDER — POLYETHYLENE GLYCOL 3350 17 G PO PACK
17.0000 g | PACK | Freq: Every day | ORAL | Status: DC
  Administered 2024-03-07 – 2024-03-09 (×3): 17 g via ORAL
  Filled 2024-03-05 (×5): qty 1

## 2024-03-05 MED ORDER — DIAZEPAM 5 MG PO TABS: 5.0000 mg | ORAL_TABLET | Freq: Four times a day (QID) | ORAL | Status: DC | PRN

## 2024-03-05 MED ORDER — DILTIAZEM HCL 25 MG/5ML IV SOLN
5.0000 mg | Freq: Once | INTRAVENOUS | Status: DC
  Filled 2024-03-05: qty 5

## 2024-03-05 MED ORDER — TAMSULOSIN HCL 0.4 MG PO CAPS
0.4000 mg | ORAL_CAPSULE | Freq: Every evening | ORAL | Status: DC
  Administered 2024-03-05 – 2024-03-09 (×5): 0.4 mg via ORAL
  Filled 2024-03-05 (×5): qty 1

## 2024-03-05 MED ORDER — METOPROLOL TARTRATE 25 MG PO TABS
25.0000 mg | ORAL_TABLET | Freq: Four times a day (QID) | ORAL | Status: DC
  Administered 2024-03-05 – 2024-03-06 (×3): 25 mg via ORAL
  Filled 2024-03-05 (×4): qty 1

## 2024-03-05 MED ORDER — IBUPROFEN 600 MG PO TABS
800.0000 mg | ORAL_TABLET | Freq: Three times a day (TID) | ORAL | Status: DC
  Administered 2024-03-05 – 2024-03-09 (×13): 800 mg via ORAL
  Filled 2024-03-05 (×13): qty 1

## 2024-03-05 MED ORDER — AZITHROMYCIN 250 MG PO TABS
500.0000 mg | ORAL_TABLET | Freq: Every day | ORAL | Status: AC
Start: 2024-03-05 — End: 2024-03-09
  Administered 2024-03-05 – 2024-03-08 (×4): 500 mg via ORAL
  Filled 2024-03-05 (×4): qty 2

## 2024-03-05 MED ORDER — SODIUM CHLORIDE 0.9 % IV SOLN: INTRAVENOUS | Status: DC

## 2024-03-05 MED ORDER — PANTOPRAZOLE SODIUM 40 MG PO TBEC
40.0000 mg | DELAYED_RELEASE_TABLET | Freq: Two times a day (BID) | ORAL | Status: DC
Start: 2024-03-05 — End: 2024-03-10
  Administered 2024-03-05 – 2024-03-09 (×9): 40 mg via ORAL
  Filled 2024-03-05 (×9): qty 1

## 2024-03-05 MED ORDER — BACLOFEN 10 MG PO TABS
40.0000 mg | ORAL_TABLET | Freq: Four times a day (QID) | ORAL | Status: DC
  Administered 2024-03-05 – 2024-03-09 (×18): 40 mg via ORAL
  Filled 2024-03-05 (×20): qty 4

## 2024-03-05 MED ORDER — DIAZEPAM 5 MG PO TABS
5.0000 mg | ORAL_TABLET | Freq: Four times a day (QID) | ORAL | Status: DC | PRN
  Administered 2024-03-05 – 2024-03-08 (×5): 5 mg via ORAL
  Filled 2024-03-05 (×6): qty 1

## 2024-03-05 MED ORDER — COLCHICINE 0.6 MG PO TABS
0.6000 mg | ORAL_TABLET | Freq: Two times a day (BID) | ORAL | Status: DC
  Administered 2024-03-05 – 2024-03-09 (×9): 0.6 mg via ORAL
  Filled 2024-03-05 (×10): qty 1

## 2024-03-05 MED ORDER — LEVALBUTEROL HCL 0.63 MG/3ML IN NEBU: 0.6300 mg | INHALATION_SOLUTION | Freq: Four times a day (QID) | RESPIRATORY_TRACT | Status: DC | PRN

## 2024-03-05 MED ORDER — SENNA 8.6 MG PO TABS
3.0000 | ORAL_TABLET | Freq: Every day | ORAL | Status: DC
  Administered 2024-03-07 – 2024-03-09 (×3): 25.8 mg via ORAL
  Filled 2024-03-05 (×5): qty 3

## 2024-03-05 NOTE — Progress Notes (Signed)
 PROGRESS NOTE  HAMDI Rodriguez FMW:[id number] DOB: 02/13/1966 DOA: 03/04/2024 PCP: [name], MD  HPI/Recap of past 24 hours: Leon Rodriguez is a 58 y.o. 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 Patient also reports cough, but denies any fever.  In the ED, patient noted to be tachycardic heart rate 105, hypotensive BP 83/71, saturating well on room air.  Labs noted for WBC 16.4, UA negative infection.  CT chest, abdomen/pelvis showed moderate to large pericardial effusion, small bilateral pleural effusion, moderate stool burden throughout the colon.  Cardiology consulted.  Patient admitted for further management.   Today, patient reports significant history of spasms, worse on bilateral lower extremities, reports 10/10 sharp chest pain, denies any SOB.  Noted to be significantly tachycardic.   Assessment/Plan: Principal Problem:   Pericardial effusion Active Problems:   Leukocytosis   Cigar smoker   Marijuana abuse  Pericardial Effusion  CT chest showed moderate to large pericardial effusion Echo done suspect early signs of tamponade with noted moderate to large pericardial effusion, EF 65 to 70%, no regional wall motion abnormality Cardiology on board, suspect secondary to pericarditis, no indication for pericardiocentesis at this time.  Plan to repeat echo if changing clinical status or within a week to monitor closely Telemetry  Chest pain likely 2/2 acute pericarditis Afebrile with leukocytosis CRP elevated at 20.8, ESR elevated at 49 Troponin negative, EKG with no acute ST changes Procalcitonin 0.14 Respiratory viral panel negative Cardiology on board, recommend starting NSAIDs and  colchicine  Monitor closely, telemetry  Paroxysmal A-fib with RVR New onset Possibly likely due to acute pericarditis Cardiology on board, starting metoprolol  for rate control, avoid anticoagulation Telemetry  Possible CAP Afebrile with leukocytosis Chest imaging with possible infiltrates, UA negative for infection Respiratory viral panel negative BC x 2 pending Continue ceftriaxone , azithromycin  for now   Quadriplegia secondary to chronic C6-T1 cervical myelopathy  Noted significant lower extremity spasm Neurogenic bladder s/p ACDF 06/05/2023  Continue baclofen , dantrolene , Valium  as needed, oxycodone  as needed, Cymbalta  Continue Flomax  Continue self-catheterization Wheel chair bound  Followed by rehab medicine    Neurogenic bowel Continue on bowel regimen      Estimated body mass index is 29.63 kg/m as calculated from the following:   Height as of this encounter: 5' 9 (1.753 m).   Weight as of this encounter: 91 kg.     Code Status: Full  Family Communication: Girlfriend at bedside  Disposition Plan: Status is: Inpatient Remains inpatient appropriate because: Level of care      Consultants: Cardiology  Procedures: None  Antimicrobials: Ceftriaxone  Azithromycin   DVT prophylaxis: Heparin  [place]   Objective: Vitals:   03/05/24 0742 03/05/24 0910 03/05/24 1055 03/05/24 1127  BP:   111/79 (!) 146/81  Pulse:   (!) 110 (!) 160  Resp:   (!) 26 (!) 22  Temp:   98 F (36.7 C) 97.9 F (36.6 C)  TempSrc:   Oral Oral  SpO2: 96% 96% 95% 90%  Weight:      Height:    5' 9 (1.753 m)    Intake/Output Summary (Last 24 hours) at 03/05/2024 1534 Last data filed at 03/04/2024 2309 Gross per 24 hour  Intake 2350 ml  Output 200 ml  Net 2150 ml   Filed Weights   03/04/24 1520  Weight: 91 kg    Exam: General: NAD  Cardiovascular: S1, S2 present Respiratory: CTAB Abdomen: Soft, nontender, nondistended, bowel sounds present Musculoskeletal: No bilateral  pedal edema noted, quadriplegic Skin: Normal Psychiatry: Fair mood   Data Reviewed: CBC: Recent Labs  Lab 03/04/24 1610 03/04/24 2042 03/05/24 0509  WBC 16.4* 16.2* 16.4*  NEUTROABS 12.3*  --   --   HGB 13.1 11.9* 12.3*  HCT 37.5* 35.2* 34.7*  MCV 90.4 93.1 89.9  PLT 280 256 289   Basic Metabolic Panel: Recent Labs  Lab 03/04/24 1610 03/04/24 2042 03/05/24 0509  NA 132*  --  135  K 4.1  --  4.1  CL 100  --  101  CO2 22  --  22  GLUCOSE 143*  --  117*  BUN 13  --  9  CREATININE 0.53* 0.49* 0.63  CALCIUM 8.6*  --  8.7*   GFR: Estimated Creatinine Clearance: 110.8 mL/min (by C-G formula based on SCr of 0.63 mg/dL). Liver Function Tests: Recent Labs  Lab 03/04/24 1610 03/05/24 0509  AST 55* 45*  ALT 42 44  ALKPHOS 129* 121  BILITOT 0.6 1.0  PROT 6.1* 6.2*  ALBUMIN  2.7* 2.5*   No results for input(s): LIPASE, AMYLASE in the last 168 hours. No results for input(s): AMMONIA in the last 168 hours. Coagulation Profile: Recent Labs  Lab 03/04/24 2042  INR 1.3*   Cardiac Enzymes: Recent Labs  Lab 03/04/24 1610  CKTOTAL 24*   BNP (last 3 results) No results for input(s): PROBNP in the last 8760 hours. HbA1C: No results for input(s): HGBA1C in the last 72 hours. CBG: Recent Labs  Lab 03/05/24 1211  GLUCAP 129*   Lipid Profile: No results for input(s): CHOL, HDL, LDLCALC, TRIG, CHOLHDL, LDLDIRECT in the last 72 hours. Thyroid Function Tests: Recent Labs    03/04/24 2042  TSH 1.372   Anemia Panel: No results for input(s): VITAMINB12, FOLATE, FERRITIN, TIBC, IRON, RETICCTPCT in the last 72 hours. Urine analysis:    Component Value Date/Time   COLORURINE AMBER (A) 03/04/2024 1649   APPEARANCEUR CLEAR 03/04/2024 1649   LABSPEC 1.029 03/04/2024 1649   PHURINE 5.0 03/04/2024 1649   GLUCOSEU NEGATIVE 03/04/2024 1649   HGBUR NEGATIVE 03/04/2024 1649   BILIRUBINUR NEGATIVE 03/04/2024 1649   KETONESUR NEGATIVE  03/04/2024 1649   PROTEINUR 30 (A) 03/04/2024 1649   UROBILINOGEN 1.0 08/04/2009 2332   NITRITE NEGATIVE 03/04/2024 1649   LEUKOCYTESUR NEGATIVE 03/04/2024 1649   Sepsis Labs: @LABRCNTIP (procalcitonin:4,lacticidven:4)  ) Recent Results (from the past 240 hours)  Blood Culture (routine x 2)     Status: None (Preliminary result)   Collection Time: 03/04/24  4:10 PM   Specimen: BLOOD  Result Value Ref Range Status   Specimen Description BLOOD SITE NOT SPECIFIED  Final   Special Requests   Final    BOTTLES DRAWN AEROBIC AND ANAEROBIC Blood Culture results may not be optimal due to an inadequate volume of blood received in culture bottles   Culture   Final    NO GROWTH < 24 HOURS Performed at [facility], [address] [place], [place] [address]    Report Status PENDING  Incomplete  Blood Culture (routine x 2)     Status: None (Preliminary result)   Collection Time: 03/04/24  4:20 PM   Specimen: BLOOD  Result Value Ref Range Status   Specimen Description  BLOOD SITE NOT SPECIFIED  Final   Special Requests   Final    BOTTLES DRAWN AEROBIC AND ANAEROBIC Blood Culture results may not be optimal due to an inadequate volume of blood received in culture bottles   Culture   Final    NO GROWTH < 24 HOURS Performed at [facility], [address] [place], [place] [address]    Report Status PENDING  Incomplete  Resp panel by RT-PCR (RSV, Flu A&B, Covid) Anterior Nasal Swab     Status: None   Collection Time: 03/04/24  9:02 PM   Specimen: Anterior Nasal Swab  Result Value Ref Range Status   SARS Coronavirus 2 by RT PCR NEGATIVE NEGATIVE Final   Influenza A by PCR NEGATIVE NEGATIVE Final   Influenza B by PCR NEGATIVE NEGATIVE Final    Comment: (NOTE) The Xpert Xpress SARS-CoV-2/FLU/RSV plus assay is intended as an aid in the diagnosis of influenza from Nasopharyngeal swab specimens and should not be used as a sole basis for treatment. Nasal washings and aspirates are  unacceptable for Xpert Xpress SARS-CoV-2/FLU/RSV testing.  Fact Sheet for Patients: BloggerCourse.com  Fact Sheet for Healthcare Providers: SeriousBroker.it  This test is not yet approved or cleared by the [place]  FDA and has been authorized for detection and/or diagnosis of SARS-CoV-2 by FDA under an Emergency Use Authorization (EUA). This EUA will remain in effect (meaning this test can be used) for the duration of the COVID-19 declaration under Section 564(b)(1) of the Act, 21 U.S.C. section 360bbb-3(b)(1), unless the authorization is terminated or revoked.     Resp Syncytial Virus by PCR NEGATIVE NEGATIVE Final    Comment: (NOTE) Fact Sheet for Patients: BloggerCourse.com  Fact Sheet for Healthcare Providers: SeriousBroker.it  This test is not yet approved or cleared by the [place]  FDA and has been authorized for detection and/or diagnosis of SARS-CoV-2 by FDA under an Emergency Use Authorization (EUA). This EUA will remain in effect (meaning this test can be used) for the duration of the COVID-19 declaration under Section 564(b)(1) of the Act, 21 U.S.C. section 360bbb-3(b)(1), unless the authorization is terminated or revoked.  Performed at [facility], [address] [place], [place] [address]   MRSA Next Gen by PCR, Nasal     Status: None   Collection Time: 03/04/24  9:37 PM   Specimen: Nasal Mucosa; Nasal Swab  Result Value Ref Range Status   MRSA by PCR Next Gen NOT DETECTED NOT DETECTED Final    Comment: (NOTE) The GeneXpert MRSA Assay (FDA approved for NASAL specimens only), is one component of a comprehensive MRSA colonization surveillance program. It is not intended to diagnose MRSA infection nor to guide or monitor treatment for MRSA infections. Test performance is not FDA approved in patients less than 72 years old. Performed at [facility], [address] [place], [place] [address]   Respiratory (~20 pathogens) panel by PCR     Status: None   Collection Time: 03/05/24  6:56 AM   Specimen: Nasopharyngeal Swab; Respiratory  Result Value Ref Range Status   Adenovirus NOT DETECTED NOT DETECTED Final   Coronavirus 229E NOT DETECTED NOT DETECTED Final    Comment: (NOTE) The Coronavirus on the Respiratory Panel, DOES NOT test for the novel  Coronavirus (2019 nCoV)    Coronavirus HKU1 NOT DETECTED NOT DETECTED Final   Coronavirus NL63 NOT DETECTED NOT DETECTED Final   Coronavirus OC43 NOT DETECTED NOT DETECTED Final   Metapneumovirus  NOT DETECTED NOT DETECTED Final   Rhinovirus / Enterovirus NOT DETECTED NOT DETECTED Final   Influenza A NOT DETECTED NOT DETECTED Final   Influenza B NOT DETECTED NOT DETECTED Final   Parainfluenza Virus 1 NOT DETECTED NOT DETECTED Final   Parainfluenza Virus 2 NOT DETECTED NOT DETECTED Final   Parainfluenza Virus 3 NOT DETECTED NOT DETECTED Final   Parainfluenza Virus 4 NOT DETECTED NOT DETECTED Final   Respiratory Syncytial Virus NOT DETECTED NOT DETECTED Final   Bordetella pertussis NOT DETECTED NOT DETECTED Final   Bordetella Parapertussis NOT DETECTED NOT DETECTED Final   Chlamydophila pneumoniae NOT DETECTED NOT DETECTED Final   Mycoplasma pneumoniae NOT DETECTED NOT DETECTED Final    Comment: Performed at [facility] Lab, [address], [place], [place] [address]      Studies: CT CHEST ABDOMEN PELVIS W CONTRAST Result Date: 03/04/2024 CLINICAL DATA:  Sepsis. Chest and abdominal pain. Shortness of breath, fatigue, weakness EXAM: CT CHEST, ABDOMEN, AND PELVIS WITH CONTRAST TECHNIQUE: Multidetector CT imaging of the chest, abdomen and pelvis was performed following the standard protocol during bolus administration of intravenous contrast. RADIATION DOSE REDUCTION: This exam was performed according to the departmental dose-optimization program which includes automated  exposure control, adjustment of the mA and/or kV according to patient size and/or use of iterative reconstruction technique. CONTRAST:  75mL OMNIPAQUE  IOHEXOL  350 MG/ML SOLN COMPARISON:  06/13/2023 FINDINGS: CT CHEST FINDINGS Cardiovascular: Moderate to large pericardial effusion. Heart is normal size. Aorta normal caliber. Scattered aortic calcifications. Mediastinum/Nodes: No mediastinal, hilar, or axillary adenopathy. Trachea and esophagus are unremarkable. Thyroid unremarkable. Lungs/Pleura: Small bilateral pleural effusions, left greater than right. Left basilar airspace opacity, favor atelectasis although pneumonia cannot be completely excluded. Linear atelectasis in the right lung base. Musculoskeletal: Chest wall soft tissues are unremarkable. No acute bony abnormality. CT ABDOMEN PELVIS FINDINGS Hepatobiliary: No focal hepatic abnormality. Gallbladder unremarkable. Pancreas: No focal abnormality or ductal dilatation. Spleen: No focal abnormality.  Normal size. Adrenals/Urinary Tract: No adrenal abnormality. No focal renal abnormality. No stones or hydronephrosis. Urinary bladder is unremarkable. Stomach/Bowel: Moderate stool burden throughout the colon. Stomach, large and small bowel grossly unremarkable. Prior appendectomy. Vascular/Lymphatic: No evidence of aneurysm or adenopathy. Reproductive: No visible focal abnormality. Other: No free fluid or free air. Musculoskeletal: No acute bony abnormality. IMPRESSION: Moderate to large pericardial effusion, new since prior study. Small bilateral pleural effusions, left greater than right. Minimal right base atelectasis. Left basilar atelectasis or infiltrate. No acute findings in the abdomen or pelvis. Moderate stool burden throughout the colon. Electronically Signed   By: Franky Crease M.D.   On: 03/04/2024 19:09   DG Chest Port 1 View Result Date: 03/04/2024 CLINICAL DATA:  Fatigue and malaise. EXAM: PORTABLE CHEST 1 VIEW COMPARISON:  July 22, 2023  FINDINGS: The cardiac silhouette is borderline in size. This may be technical in origin. Mild atelectasis and/or infiltrate is seen within the left lung base. There is a small left pleural effusion. No pneumothorax is identified. A stable, benign-appearing sclerotic lesion is seen within the neck of the proximal right humerus. Postoperative changes are present throughout the cervical spine. IMPRESSION: 1. Mild left basilar atelectasis and/or infiltrate. 2. Small left pleural effusion. Electronically Signed   By: Suzen Dials M.D.   On: 03/04/2024 15:55    Scheduled Meds:  azithromycin   500 mg Oral Daily   baclofen   40 mg Oral QID   colchicine   0.6 mg Oral BID   dantrolene   100 mg Oral TID  DULoxetine   60 mg Oral Daily   heparin   5,000 Units Subcutaneous Q8H   ibuprofen   800 mg Oral TID   metoprolol  tartrate  25 mg Oral Q6H   pantoprazole   40 mg Oral BID   polyethylene glycol  17 g Oral Daily   senna  3 tablet Oral Daily   tamsulosin   0.4 mg Oral QPM    Continuous Infusions:  cefTRIAXone  (ROCEPHIN )  IV Stopped (03/04/24 2240)     LOS: 1 day     Lebron JINNY Cage, MD [facility]  If 7PM-7AM, please contact night-coverage [email or web address] 03/05/2024, 3:34 PM

## 2024-03-05 NOTE — Plan of Care (Signed)

## 2024-03-05 NOTE — ED Notes (Signed)
 Pt states he does not need to be cathed at this time. He would like to wait until later.

## 2024-03-05 NOTE — ED Notes (Signed)
 Called MD  Tuner for nurse nickolas, pt having chest pains. 8 :39

## 2024-03-05 NOTE — ED Notes (Signed)
 Talked with cardiology over the phone due page, reason being 10/10 chest pain. Cardiology team advised contacting primary provider.

## 2024-03-05 NOTE — Progress Notes (Signed)
 Rounding Note    Patient Name: Leon Rodriguez Date of Encounter: 03/05/2024  [facility] Cardiologist: New--Dr. Michele  Subjective   Reviewed echo results. He is still feeling poorly, intermittently still having sharp chest pain. Muscle spasms also bothersome. Discussed suspicion that this is pericarditis, how that causes fluid and chest pain, how we monitor  Inpatient Medications    Scheduled Meds:  azithromycin   500 mg Oral Daily   baclofen   40 mg Oral QID   colchicine   0.6 mg Oral BID   dantrolene   100 mg Oral TID   DULoxetine   60 mg Oral Daily   heparin   5,000 Units Subcutaneous Q8H   ibuprofen   800 mg Oral TID   metoprolol  tartrate  25 mg Oral Q6H   polyethylene glycol  17 g Oral Daily   senna  3 tablet Oral Daily   tamsulosin   0.4 mg Oral QPM   Continuous Infusions:  cefTRIAXone  (ROCEPHIN )  IV Stopped (03/04/24 2240)   PRN Meds: acetaminophen  **OR** acetaminophen , albuterol , diazepam , hydrALAZINE , morphine  injection, ondansetron  **OR** ondansetron  (ZOFRAN ) IV, oxyCODONE    Vital Signs    Vitals:   03/05/24 0742 03/05/24 0910 03/05/24 1055 03/05/24 1127  BP:   111/79 (!) 146/81  Pulse:   (!) 110 (!) 160  Resp:   (!) 26 (!) 22  Temp:   98 F (36.7 C) 97.9 F (36.6 C)  TempSrc:   Oral Oral  SpO2: 96% 96% 95% 90%  Weight:      Height:    5' 9 (1.753 m)    Intake/Output Summary (Last 24 hours) at 03/05/2024 1246 Last data filed at 03/04/2024 2309 Gross per 24 hour  Intake 2350 ml  Output 200 ml  Net 2150 ml      03/04/2024    3:20 PM 01/20/2024    2:55 PM 01/08/2024    1:32 PM  Last 3 Weights  Weight (lbs) 200 lb 9.9 oz -- --  Weight (kg) 91 kg -- --      Telemetry    Sinus tach - Personally Reviewed  Physical Exam   GEN: No acute distress.   Neck: No JVD Cardiac: RRR, no murmurs, rubs, or gallops.  Respiratory: Clear to auscultation bilaterally. GI: Soft, nontender, non-distended  MS: No edema; No deformity. Psych: Normal  affect   New pertinent results (labs, ECG, imaging, cardiac studies)    Echo personally reviewed, see below  Assessment & Plan    Pericardial effusion -moderate, greatest dimension 1.92 cm adjacent to RA/RV. IVC is dilated and does not collapse, there is MV respiratory variability, but no apparent RA/RV collapse. Clinically blood pressure currently does not suggest tamponade. Remains in sinus tachycardia, unchanged with fluids and improvement in blood pressure. On chart review, appears he is chronically in sinus tach -no indication for pericardiocentesis at this time. Suspect 2/2 pericarditis, below -repeat echo either for change in clinical status or within a week to make sure it is not worsening. Expect that it will improve with antiinflammatories as below.  Chest pain -hsTn normal -with effusion and chest pain, suggest pericarditis. CRP elevated at 20.8, ESR elevated at 49 -respiratory viral panel negative -procalcitonin 0.14 -If no infection found, then would start NSAIDs and colchicine  for pericarditis. Discussed with Dr. Donnamarie, ok to start treatment, ordered  Quadriplegia 2/2 cervical myelopathy with neurogenic bowel/bladder Concern for UTI -management per primary team. Presented with fatigue, malaise, body aches, dark urine, night sweats -leukocytosis to 16, baseline 6 -UA largely unremarkable, no  urine culture that I see, blood cultures NGTD  UPDATED TO ADD: On arrival to the floor, heart rate is faster and irregular, concerning for atrial fibrillation. Will get 12 lead ECG. I discussed with patient and his family at bedside that this is likely being driven by the pericarditis.  Atrial fibrillation with RVR, new -CHA2DS2/VAS Stroke Risk Points =0 -likely 2/2 inflammation. Will avoid anticoagulation, will add metoprolol  for rate control and try to manage the inflammation  High complexity medical decision making given multiple comorbidities and concerns as above     Signed, Shelda Bruckner, MD  03/05/2024, 12:46 PM

## 2024-03-05 NOTE — Progress Notes (Signed)
 MEWS Progress Note  Patient Details Name: Leon Rodriguez MRN: [id number] DOB: 1965/08/03 Today's Date: 03/05/2024   MEWS Flowsheet Documentation:  Assess: MEWS Score Temp: 97.9 F (36.6 C) BP: (!) 146/81 MAP (mmHg): 96 Pulse Rate: (!) 160 ECG Heart Rate: (!) 154 Resp: (!) 22 Level of Consciousness: Alert SpO2: 90 % O2 Device: Room Air Assess: MEWS Score MEWS Temp: 0 MEWS Systolic: 0 MEWS Pulse: 3 MEWS RR: 1 MEWS LOC: 0 MEWS Score: 4 MEWS Score Color: Red Assess: SIRS CRITERIA SIRS Temperature : 0 SIRS Respirations : 1 SIRS Pulse: 1 SIRS WBC: 1 SIRS Score Sum : 3 Assess: if the MEWS score is Yellow or Red Were vital signs accurate and taken at a resting state?: Yes Does the patient meet 2 or more of the SIRS criteria?: Yes Does the patient have a confirmed or suspected source of infection?: No MEWS guidelines implemented : Yes, red Treat MEWS Interventions: Considered administering scheduled or prn medications/treatments as ordered Take Vital Signs Increase Vital Sign Frequency : Red: Q1hr x2, continue Q4hrs until patient remains green for 12hrs Escalate MEWS: Escalate: Red: Discuss with charge nurse and notify provider. Consider notifying RRT. If remains red for 2 hours consider need for higher level of care Notify: Charge Nurse/RN Name of Charge Nurse/RN Notified: Harlene, RN Provider Notification Provider Name/Title: [place],MD Date Provider Notified: 03/05/24 Time Provider Notified: 1147 Method of Notification: Page Notification Reason: Change in status Provider response: Evaluate remotely      Waddell JONELLE Pa 03/05/2024, 11:48 AM

## 2024-03-05 NOTE — ED Notes (Signed)
 When RN entered room to prep for I/out cath pt significant other said I already did it, somone had been by yet so I went ahead and did it, I do it at home. She was asked to let staff perform.

## 2024-03-05 NOTE — ED Notes (Addendum)
 Secretary asked to page sent to primary provider and cardiology. Pt describes 10/10 chest pain.

## 2024-03-05 NOTE — Consult Note (Signed)
 WOC Nurse Consult Note: patient previously followed at [facility] for B heel pressure injuries; last seen in their office 11/25/2023 with both heels deemed healed  Reason for Consult: foot wound  Wound type: L heel unstageable pressure injury  Pressure Injury POA: Yes Measurement: see nursing flowsheet  Wound bed: largely pink dry, small remnant of dark eschar  Drainage (amount, consistency, odor) appears dry  Periwound: Dressing procedure/placement/frequency:  Paint L heel with Betadine 2 times daily allow to air dry.  Cover with silicone foam and place foot in Prevalon boot to offload pressure as this area is fragile.   POC discussed with bedside nurse. WOC team will not follow. Re-consult if further needs arise.   Thank you,    Powell Bar MSN, RN-BC, [employer]

## 2024-03-06 ENCOUNTER — Inpatient Hospital Stay (HOSPITAL_COMMUNITY)

## 2024-03-06 ENCOUNTER — Other Ambulatory Visit (HOSPITAL_COMMUNITY): Payer: Self-pay

## 2024-03-06 ENCOUNTER — Telehealth (HOSPITAL_COMMUNITY): Payer: Self-pay | Admitting: Pharmacy Technician

## 2024-03-06 DIAGNOSIS — I480 Paroxysmal atrial fibrillation: Secondary | ICD-10-CM | POA: Diagnosis not present

## 2024-03-06 DIAGNOSIS — I300 Acute nonspecific idiopathic pericarditis: Secondary | ICD-10-CM | POA: Diagnosis not present

## 2024-03-06 DIAGNOSIS — I3139 Other pericardial effusion (noninflammatory): Secondary | ICD-10-CM

## 2024-03-06 LAB — BASIC METABOLIC PANEL WITH GFR
Anion gap: 12 (ref 5–15)
BUN: 17 mg/dL (ref 6–20)
CO2: 22 mmol/L (ref 22–32)
Calcium: 8.5 mg/dL — ABNORMAL LOW (ref 8.9–10.3)
Chloride: 102 mmol/L (ref 98–111)
Creatinine, Ser: 0.6 mg/dL — ABNORMAL LOW (ref 0.61–1.24)
GFR, Estimated: >60 mL/min (ref >60)
Glucose, Bld: 100 mg/dL — ABNORMAL HIGH (ref 70–99)
Potassium: 4.1 mmol/L (ref 3.5–5.1)
Sodium: 136 mmol/L (ref 135–145)

## 2024-03-06 LAB — ECHOCARDIOGRAM LIMITED
Height: 69 in
Single Plane A4C EF: 47.3 %
Weight: 3209.9 [oz_av]

## 2024-03-06 LAB — CBC WITH DIFFERENTIAL/PLATELET
Abs Immature Granulocytes: 0.05 K/uL (ref 0.00–0.07)
Basophils Absolute: 0 K/uL (ref 0.0–0.1)
Basophils Relative: 0 %
Eosinophils Absolute: 0 K/uL (ref 0.0–0.5)
Eosinophils Relative: 0 %
HCT: 31.5 % — ABNORMAL LOW (ref 39.0–52.0)
Hemoglobin: 11.2 g/dL — ABNORMAL LOW (ref 13.0–17.0)
Immature Granulocytes: 0 %
Lymphocytes Relative: 22 %
Lymphs Abs: 2.6 K/uL (ref 0.7–4.0)
MCH: 31.8 pg (ref 26.0–34.0)
MCHC: 35.6 g/dL (ref 30.0–36.0)
MCV: 89.5 fL (ref 80.0–100.0)
Monocytes Absolute: 1.3 K/uL — ABNORMAL HIGH (ref 0.1–1.0)
Monocytes Relative: 11 %
Neutro Abs: 7.7 K/uL (ref 1.7–7.7)
Neutrophils Relative %: 67 %
Platelets: 286 K/uL (ref 150–400)
RBC: 3.52 MIL/uL — ABNORMAL LOW (ref 4.22–5.81)
RDW: 13.5 % (ref 11.5–15.5)
WBC: 11.6 K/uL — ABNORMAL HIGH (ref 4.0–10.5)
nRBC: 0 % (ref 0.0–0.2)

## 2024-03-06 LAB — HEPARIN LEVEL (UNFRACTIONATED): Heparin Unfractionated: <0.1 [IU]/mL — ABNORMAL LOW (ref 0.30–0.70)

## 2024-03-06 LAB — GLUCOSE, CAPILLARY
Glucose-Capillary: 100 mg/dL — ABNORMAL HIGH (ref 70–99)
Glucose-Capillary: 119 mg/dL — ABNORMAL HIGH (ref 70–99)
Glucose-Capillary: 144 mg/dL — ABNORMAL HIGH (ref 70–99)
Glucose-Capillary: 122 mg/dL — ABNORMAL HIGH (ref 70–99)

## 2024-03-06 LAB — LEGIONELLA PNEUMOPHILA SEROGP 1 UR AG: L. pneumophila Serogp 1 Ur Ag: NEGATIVE

## 2024-03-06 MED ORDER — HEPARIN (PORCINE) 25000 UT/250ML-% IV SOLN
1950.0000 [IU]/h | INTRAVENOUS | Status: DC
  Administered 2024-03-06: 1300 [IU]/h via INTRAVENOUS
  Administered 2024-03-07: 1950 [IU]/h via INTRAVENOUS
  Administered 2024-03-07: 1550 [IU]/h via INTRAVENOUS
  Administered 2024-03-08: 1950 [IU]/h via INTRAVENOUS
  Filled 2024-03-06 (×4): qty 250

## 2024-03-06 MED ORDER — HEPARIN BOLUS VIA INFUSION
2500.0000 [IU] | Freq: Once | INTRAVENOUS | Status: AC
  Administered 2024-03-06: 2500 [IU] via INTRAVENOUS
  Filled 2024-03-06: qty 2500

## 2024-03-06 MED ORDER — METOPROLOL TARTRATE 12.5 MG HALF TABLET
12.5000 mg | ORAL_TABLET | Freq: Four times a day (QID) | ORAL | Status: DC
  Administered 2024-03-06 – 2024-03-09 (×7): 12.5 mg via ORAL
  Filled 2024-03-06 (×10): qty 1

## 2024-03-06 MED ORDER — HEPARIN BOLUS VIA INFUSION
5000.0000 [IU] | Freq: Once | INTRAVENOUS | Status: AC
  Administered 2024-03-06: 5000 [IU] via INTRAVENOUS
  Filled 2024-03-06: qty 5000

## 2024-03-06 MED ORDER — AMIODARONE HCL IN DEXTROSE 360-4.14 MG/200ML-% IV SOLN
60.0000 mg/h | INTRAVENOUS | Status: DC
  Administered 2024-03-06: 60 mg/h via INTRAVENOUS
  Filled 2024-03-06: qty 200

## 2024-03-06 MED ORDER — AMIODARONE HCL IN DEXTROSE 360-4.14 MG/200ML-% IV SOLN
30.0000 mg/h | INTRAVENOUS | Status: DC
  Administered 2024-03-06: 30 mg/h via INTRAVENOUS
  Filled 2024-03-06 (×2): qty 200

## 2024-03-06 MED ORDER — LACTATED RINGERS IV BOLUS
1000.0000 mL | Freq: Once | INTRAVENOUS | Status: AC
Start: 2024-03-06 — End: 2024-03-06
  Administered 2024-03-06: 1000 mL via INTRAVENOUS

## 2024-03-06 MED ORDER — AMIODARONE LOAD VIA INFUSION
150.0000 mg | Freq: Once | INTRAVENOUS | Status: AC
  Administered 2024-03-06: 150 mg via INTRAVENOUS
  Filled 2024-03-06: qty 83.34

## 2024-03-06 NOTE — Progress Notes (Addendum)
  Progress Note  Patient Name: Leon Rodriguez Date of Encounter: 03/06/2024 [facility] Cardiologist: None   Interval Summary   On interview patient reported that his chest pain had improved and he denied any ongoing chest pain.  He would typically get his chest pain when his legs would spasm and when he would cough.   Vital Signs Vitals:   03/05/24 2338 03/06/24 0347 03/06/24 0527 03/06/24 0754  BP: 91/73 (!) 81/61 (!) 103/57 106/70  Pulse: (!) 116 (!) 116 (!) 117 (!) 123  Resp: (!) 25 20 20  (!) 24  Temp: 97.6 F (36.4 C) 98.1 F (36.7 C)  97.7 F (36.5 C)  TempSrc: Oral Oral  Oral  SpO2: 99% 98% 97% 98%  Weight:      Height:        Intake/Output Summary (Last 24 hours) at 03/06/2024 1221 Last data filed at 03/06/2024 0700 Gross per 24 hour  Intake --  Output 550 ml  Net -550 ml      03/04/2024    3:20 PM 01/20/2024    2:55 PM 01/08/2024    1:32 PM  Last 3 Weights  Weight (lbs) 200 lb 9.9 oz -- --  Weight (kg) 91 kg -- --      Telemetry/ECG  Heart rates are in the 110s to 130s overnight.  Current heart rates are in the 130s.- Personally Reviewed  Physical Exam  GEN: No acute distress.   Neck: No JVD Cardiac: Irregularly irregular rhythm, no murmurs, rubs, or gallops.  Respiratory: Bibasilar crackles present on inspiration GI: Soft, nontender, non-distended  MS: Was not able to physically inspect patient's feet for edema due to patient having spasms when his feet are touched.  Patient's legs do not look edematous.  Assessment & Plan   Pericardial effusion Chest pain -suspected pericarditis Chest pain was worse with coughing but not positional. Patient had elevated CRP of 20.8 and ESR of 49. Respiratory panel negative High-sensitivity troponins negative.  BNP negative. Pericardial effusion is suspected be secondary to pericarditis. Patient's blood pressure has been hypotensive at times.  This is because the patient was started on  metoprolol . Because of this the patient was started on ibuprofen  and colchicine  Continue colchicine  0.6 mg twice daily. Continue ibuprofen  800 mg three times daily.   New onset A-fib with RVR Hypotension In the setting of suspected pericarditis and CAP. CHA2DS2-VASc Score = 1 [CHF History: 0, HTN History: 0, Diabetes History: 0, Stroke History: 0, Vascular Disease History: 1, Age Score: 0, Gender Score: 0].  Therefore, the patient's annual risk of stroke is 0.6 %.   Patient reported that he feels like his fatigue has worsened as he went into atrial fibrillation.  Denies any palpitations or lightheadedness. Was started on metoprolol  tartrate 25 mg every 6 hours. Start IV and bolus amiodarone . Start IV heparin  since we are adding amiodarone  and patient could convert. Decrease metoprolol  to 12.5 mg every 6 hours.    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

## 2024-03-06 NOTE — Progress Notes (Signed)
 Chaplain responded to AD spiritual consult. Paperwork and information given. Sister bedside. No additional spiritual needs expressed at this time, however chaplains remain available as needed.

## 2024-03-06 NOTE — TOC Initial Note (Signed)
 Transition of Care [facility]) - Initial/Assessment Note    Patient Details  Name: Leon Rodriguez MRN: [id number] Date of Birth: [birth year]  [facility] CM/SW Contact:    Sudie Erminio Deems, RN Phone Number: 03/06/2024, 4:17 PM  Clinical Narrative: Patient presented for diaphoresis. PTA patient was from home with significant other. Family was at the bedside during the visit. Patient was currently receiving outpatient [facility] at [facility] [address] and he has transportation via [employer]. Patient has DME motorized wheelchair. Patient has insurance and PCP- no issues with medication cost and transportation to appointments. Inpatient Case Manager will continue to follow for additional disposition needs.               Expected Discharge Plan: Home/Self Care Barriers to Discharge: Continued Medical Work up  Expected Discharge Plan and Services In-house Referral: NA Discharge Planning Services: CM Consult Post Acute Care Choice: NA Living arrangements for the past 2 months: Single Family Home                   DME Agency: NA       HH Arranged: NA   Prior Living Arrangements/Services Living arrangements for the past 2 months: Single Family Home Lives with:: Significant Other Patient language and need for interpreter reviewed:: Yes Do you feel safe going back to the place where you live?: Yes      Need for Family Participation in Patient Care: No (Comment) Care giver support system in place?: No (comment) Current home services: DME (motorized wheelchair.) Criminal Activity/Legal Involvement Pertinent to Current Situation/Hospitalization: No - Comment as needed  Activities of Daily Living   ADL Screening (condition at time of admission) Independently performs ADLs?: No Does the patient have a NEW difficulty with bathing/dressing/toileting/self-feeding that is expected to last >3 days?: No Does the patient have a NEW difficulty with getting in/out of bed,  walking, or climbing stairs that is expected to last >3 days?: No Does the patient have a NEW difficulty with communication that is expected to last >3 days?: No Is the patient deaf or have difficulty hearing?: No Does the patient have difficulty seeing, even when wearing glasses/contacts?: No Does the patient have difficulty concentrating, remembering, or making decisions?: No  Permission Sought/Granted Permission sought to share information with : Family Supports, Case Manager                Emotional Assessment Appearance:: Appears stated age Attitude/Demeanor/Rapport: Engaged Affect (typically observed): Appropriate Orientation: : Oriented to Self, Oriented to Place, Oriented to  Time, Oriented to Situation Alcohol / Substance Use: Not Applicable Psych Involvement: No (comment)  Admission diagnosis:  Pericardial effusion [I31.39] Patient Active Problem List   Diagnosis Date Noted   Pericardial effusion 03/04/2024   Leukocytosis 03/04/2024   Cigar smoker 03/04/2024   Marijuana abuse 03/04/2024   Nerve pain 09/04/2023   Quadriplegia (HCC) 09/04/2023   Pressure injury of left foot, stage 4 (HCC) 09/04/2023   Paraplegia (HCC) 07/17/2023   Spasticity 07/12/2023   Adjustment disorder with depressed mood 06/30/2023   Bacteremia 06/16/2023   Neurogenic bladder 06/14/2023   Lumbar canal stenosis 06/14/2023   Lower extremity weakness 06/14/2023   Acute cystitis without hematuria 06/14/2023   Cervical myelopathy (HCC) 06/07/2023   Cervical spinal stenosis 06/05/2023   Colon cancer screening 03/06/2023   Bilateral hip pain 01/16/2023   Obesity (BMI 30-39.9) 01/16/2023   Polysubstance use disorder 01/16/2023   Right hydrocele 01/07/2023   Epididymo-orchitis 01/06/2023  PCP:  [name], MD Pharmacy:   [facility] [address] [place] [address] Phone: [phone] Fax: [phone]     Social Drivers of Health  (SDOH) Social History: SDOH Screenings   Food Insecurity: No Food Insecurity (03/05/2024)  Housing: Low Risk  (03/05/2024)  Transportation Needs: No Transportation Needs (03/05/2024)  Utilities: Not At Risk (03/05/2024)  Depression (PHQ2-9): Low Risk  (02/19/2024)  Recent Concern: Depression (PHQ2-9) - Medium Risk (01/20/2024)  Social Connections: Moderately Isolated (03/05/2024)  Tobacco Use: High Risk (03/05/2024)   SDOH Interventions:     Readmission Risk Interventions    03/06/2024    4:14 PM 07/17/2023   11:19 AM 06/07/2023   12:29 PM  Readmission Risk Prevention Plan  Medication Screening   Complete  Transportation Screening Complete Complete Complete  HRI or Home Care Consult Complete Complete   Social Work Consult for Recovery Care Planning/Counseling Complete Complete   Palliative Care Screening Complete Not Applicable   Medication Review Oceanographer) Referral to Pharmacy Complete

## 2024-03-06 NOTE — Telephone Encounter (Signed)
Patient Product/process development scientist completed.    The patient is insured through [facility].     Ran test claim for Eliquis 5 mg and the current 30 day co-pay is $4.00.  Ran test claim for Xarelto 20 mg and the current 30 day co-pay is $4.00.  This test claim was processed through [facility]- copay amounts may vary at other pharmacies due to pharmacy/plan contracts, or as the patient moves through the different stages of their insurance plan.     Roland Earl, CPHT Pharmacy Technician III Certified Patient Advocate [facility] Patient Advocate Team Direct Number: [phone]  Fax: [phone]

## 2024-03-06 NOTE — Progress Notes (Signed)
 PROGRESS NOTE  Leon Rodriguez FMW:[id number] DOB: 1965-10-29 DOA: 03/04/2024 PCP: [name], MD  HPI/Recap of past 24 hours: Leon Rodriguez is a 58 y.o. 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 Patient also reports cough, but denies any fever.  In the ED, patient noted to be tachycardic heart rate 105, hypotensive BP 83/71, saturating well on room air.  Labs noted for WBC 16.4, UA negative infection.  CT chest, abdomen/pelvis showed moderate to large pericardial effusion, small bilateral pleural effusion, moderate stool burden throughout the colon.  Cardiology consulted.  Patient admitted for further management.    Today, patient reports chest pain has improved.  Still noted to be in A-fib with RVR, BP soft.     Assessment/Plan: Principal Problem:   Pericardial effusion Active Problems:   Leukocytosis   Cigar smoker   Marijuana abuse  Pericardial Effusion  CT chest showed moderate to large pericardial effusion Echo done suspect early signs of tamponade with noted moderate to large pericardial effusion, EF 65 to 70%, no regional wall motion abnormality Repeat echo done on 9/12 showed moderate pericardial effusion, no evidence to suggest tamponade.  LVEF has now decreased to 35 to 40% Cardiology on board, suspect secondary to pericarditis, no indication for pericardiocentesis at this time.  Plan to repeat echo if changing clinical status or within a week to monitor closely Telemetry  Chest pain likely 2/2 acute pericarditis Afebrile with leukocytosis CRP elevated at 20.8, ESR elevated at 49 Troponin negative, EKG with no acute ST changes Procalcitonin 0.14 Respiratory viral panel  negative Cardiology on board, recommend starting NSAIDs and colchicine  Continue ibuprofen , colchicine  Monitor closely, telemetry  Paroxysmal A-fib with RVR New onset Possibly likely due to acute pericarditis Cardiology on board, starting IV amiodarone  drip, continue metoprolol  12.5 mg every 6 hours IV heparin  drip Telemetry  Possible CAP Afebrile with leukocytosis Chest imaging with possible infiltrates UA negative for infection, UC pending Respiratory viral panel negative BC x 2 NGTD Continue ceftriaxone , azithromycin  for now   Quadriplegia secondary to chronic C6-T1 cervical myelopathy  Noted significant lower extremity spasm Neurogenic bladder s/p ACDF 06/05/2023  Continue baclofen , dantrolene , Valium  as needed, oxycodone  as needed, Cymbalta  Continue Flomax  Continue self-catheterization Wheel chair bound  Followed by rehab medicine    Neurogenic bowel Continue on bowel regimen      Estimated body mass index is 29.63 kg/m as calculated from the following:   Height as of this encounter: 5' 9 (1.753 m).   Weight as of this encounter: 91 kg.     Code Status: Full  Family Communication: Girlfriend at bedside  Disposition Plan: Status is: Inpatient Remains inpatient appropriate because: Level of care      Consultants: Cardiology  Procedures: None  Antimicrobials: Ceftriaxone  Azithromycin   DVT prophylaxis: Heparin  drip   Objective: Vitals:   03/06/24 0754 03/06/24 1255 03/06/24 1354 03/06/24 1704  BP: 106/70 (!) 89/66 102/87 91/70  Pulse: (!) 123 (!) 128 (!) 117 98  Resp: (!) 24 (!) 24 (!) 26 (!) 26  Temp: 97.7 F (36.5 C) (!) 97.5 F (36.4 C)  98.1 F (36.7 C)  TempSrc: Oral Oral  Axillary  SpO2: 98% 99% 99% 100%  Weight:  Height:        Intake/Output Summary (Last 24 hours) at 03/06/2024 1915 Last data filed at 03/06/2024 1700 Gross per 24 hour  Intake 917.64 ml  Output 550 ml  Net 367.64 ml   Filed Weights   03/04/24 1520   Weight: 91 kg    Exam: General: NAD  Cardiovascular: S1, S2 present Respiratory: CTAB Abdomen: Soft, nontender, nondistended, bowel sounds present Musculoskeletal: No bilateral pedal edema noted, quadriplegic Skin: Normal Psychiatry: Fair mood   Data Reviewed: CBC: Recent Labs  Lab 03/04/24 1610 03/04/24 2042 03/05/24 0509 03/06/24 0428  WBC 16.4* 16.2* 16.4* 11.6*  NEUTROABS 12.3*  --   --  7.7  HGB 13.1 11.9* 12.3* 11.2*  HCT 37.5* 35.2* 34.7* 31.5*  MCV 90.4 93.1 89.9 89.5  PLT 280 256 289 286   Basic Metabolic Panel: Recent Labs  Lab 03/04/24 1610 03/04/24 2042 03/05/24 0509 03/06/24 0428  NA 132*  --  135 136  K 4.1  --  4.1 4.1  CL 100  --  101 102  CO2 22  --  22 22  GLUCOSE 143*  --  117* 100*  BUN 13  --  9 17  CREATININE 0.53* 0.49* 0.63 0.60*  CALCIUM 8.6*  --  8.7* 8.5*   GFR: Estimated Creatinine Clearance: 110.8 mL/min (A) (by C-G formula based on SCr of 0.6 mg/dL (L)). Liver Function Tests: Recent Labs  Lab 03/04/24 1610 03/05/24 0509  AST 55* 45*  ALT 42 44  ALKPHOS 129* 121  BILITOT 0.6 1.0  PROT 6.1* 6.2*  ALBUMIN  2.7* 2.5*   No results for input(s): LIPASE, AMYLASE in the last 168 hours. No results for input(s): AMMONIA in the last 168 hours. Coagulation Profile: Recent Labs  Lab 03/04/24 2042  INR 1.3*   Cardiac Enzymes: Recent Labs  Lab 03/04/24 1610  CKTOTAL 24*   BNP (last 3 results) No results for input(s): PROBNP in the last 8760 hours. HbA1C: No results for input(s): HGBA1C in the last 72 hours. CBG: Recent Labs  Lab 03/05/24 1710 03/05/24 2118 03/06/24 0752 03/06/24 1253 03/06/24 1701  GLUCAP 153* 153* 100* 119* 144*   Lipid Profile: No results for input(s): CHOL, HDL, LDLCALC, TRIG, CHOLHDL, LDLDIRECT in the last 72 hours. Thyroid Function Tests: Recent Labs    03/04/24 2042  TSH 1.372   Anemia Panel: No results for input(s): VITAMINB12, FOLATE, FERRITIN, TIBC,  IRON, RETICCTPCT in the last 72 hours. Urine analysis:    Component Value Date/Time   COLORURINE AMBER (A) 03/04/2024 1649   APPEARANCEUR CLEAR 03/04/2024 1649   LABSPEC 1.029 03/04/2024 1649   PHURINE 5.0 03/04/2024 1649   GLUCOSEU NEGATIVE 03/04/2024 1649   HGBUR NEGATIVE 03/04/2024 1649   BILIRUBINUR NEGATIVE 03/04/2024 1649   KETONESUR NEGATIVE 03/04/2024 1649   PROTEINUR 30 (A) 03/04/2024 1649   UROBILINOGEN 1.0 08/04/2009 2332   NITRITE NEGATIVE 03/04/2024 1649   LEUKOCYTESUR NEGATIVE 03/04/2024 1649   Sepsis Labs: @LABRCNTIP (procalcitonin:4,lacticidven:4)  ) Recent Results (from the past 240 hours)  Blood Culture (routine x 2)     Status: None (Preliminary result)   Collection Time: 03/04/24  4:10 PM   Specimen: BLOOD  Result Value Ref Range Status   Specimen Description BLOOD SITE NOT SPECIFIED  Final   Special Requests   Final    BOTTLES DRAWN AEROBIC AND ANAEROBIC Blood Culture results may not be optimal due to an inadequate volume of blood received in culture bottles   Culture   Final  NO GROWTH 2 DAYS Performed at [facility], [address], [place], [place] [address]    Report Status PENDING  Incomplete  Blood Culture (routine x 2)     Status: None (Preliminary result)   Collection Time: 03/04/24  4:20 PM   Specimen: BLOOD  Result Value Ref Range Status   Specimen Description BLOOD SITE NOT SPECIFIED  Final   Special Requests   Final    BOTTLES DRAWN AEROBIC AND ANAEROBIC Blood Culture results may not be optimal due to an inadequate volume of blood received in culture bottles   Culture   Final    NO GROWTH 2 DAYS Performed at [facility], [address], [place], [place] [address]    Report Status PENDING  Incomplete  Resp panel by RT-PCR (RSV, Flu A&B, Covid) Anterior Nasal Swab     Status: None   Collection Time: 03/04/24  9:02 PM   Specimen: Anterior Nasal Swab  Result Value Ref Range Status   SARS Coronavirus 2 by RT PCR NEGATIVE  NEGATIVE Final   Influenza A by PCR NEGATIVE NEGATIVE Final   Influenza B by PCR NEGATIVE NEGATIVE Final    Comment: (NOTE) The Xpert Xpress SARS-CoV-2/FLU/RSV plus assay is intended as an aid in the diagnosis of influenza from Nasopharyngeal swab specimens and should not be used as a sole basis for treatment. Nasal washings and aspirates are unacceptable for Xpert Xpress SARS-CoV-2/FLU/RSV testing.  Fact Sheet for Patients: BloggerCourse.com  Fact Sheet for Healthcare Providers: SeriousBroker.it  This test is not yet approved or cleared by the [place]  FDA and has been authorized for detection and/or diagnosis of SARS-CoV-2 by FDA under an Emergency Use Authorization (EUA). This EUA will remain in effect (meaning this test can be used) for the duration of the COVID-19 declaration under Section 564(b)(1) of the Act, 21 U.S.C. section 360bbb-3(b)(1), unless the authorization is terminated or revoked.     Resp Syncytial Virus by PCR NEGATIVE NEGATIVE Final    Comment: (NOTE) Fact Sheet for Patients: BloggerCourse.com  Fact Sheet for Healthcare Providers: SeriousBroker.it  This test is not yet approved or cleared by the [place]  FDA and has been authorized for detection and/or diagnosis of SARS-CoV-2 by FDA under an Emergency Use Authorization (EUA). This EUA will remain in effect (meaning this test can be used) for the duration of the COVID-19 declaration under Section 564(b)(1) of the Act, 21 U.S.C. section 360bbb-3(b)(1), unless the authorization is terminated or revoked.  Performed at [facility], [address] [place], [place] [address]   MRSA Next Gen by PCR, Nasal     Status: None   Collection Time: 03/04/24  9:37 PM   Specimen: Nasal Mucosa; Nasal Swab  Result Value Ref Range Status   MRSA by PCR Next Gen NOT DETECTED NOT DETECTED Final    Comment:  (NOTE) The GeneXpert MRSA Assay (FDA approved for NASAL specimens only), is one component of a comprehensive MRSA colonization surveillance program. It is not intended to diagnose MRSA infection nor to guide or monitor treatment for MRSA infections. Test performance is not FDA approved in patients less than 22 years old. Performed at [facility], [address], [place], [place] [address]   Respiratory (~20 pathogens) panel by PCR     Status: None   Collection Time: 03/05/24  6:56 AM   Specimen: Nasopharyngeal Swab; Respiratory  Result Value Ref Range Status   Adenovirus NOT DETECTED NOT DETECTED Final  Coronavirus 229E NOT DETECTED NOT DETECTED Final    Comment: (NOTE) The Coronavirus on the Respiratory Panel, DOES NOT test for the novel  Coronavirus (2019 nCoV)    Coronavirus HKU1 NOT DETECTED NOT DETECTED Final   Coronavirus NL63 NOT DETECTED NOT DETECTED Final   Coronavirus OC43 NOT DETECTED NOT DETECTED Final   Metapneumovirus NOT DETECTED NOT DETECTED Final   Rhinovirus / Enterovirus NOT DETECTED NOT DETECTED Final   Influenza A NOT DETECTED NOT DETECTED Final   Influenza B NOT DETECTED NOT DETECTED Final   Parainfluenza Virus 1 NOT DETECTED NOT DETECTED Final   Parainfluenza Virus 2 NOT DETECTED NOT DETECTED Final   Parainfluenza Virus 3 NOT DETECTED NOT DETECTED Final   Parainfluenza Virus 4 NOT DETECTED NOT DETECTED Final   Respiratory Syncytial Virus NOT DETECTED NOT DETECTED Final   Bordetella pertussis NOT DETECTED NOT DETECTED Final   Bordetella Parapertussis NOT DETECTED NOT DETECTED Final   Chlamydophila pneumoniae NOT DETECTED NOT DETECTED Final   Mycoplasma pneumoniae NOT DETECTED NOT DETECTED Final    Comment: Performed at [facility], [address] [place], [place] [address]      Studies: ECHOCARDIOGRAM LIMITED Result Date: 03/06/2024    ECHOCARDIOGRAM LIMITED REPORT   Patient Name:   CHAMAR BROUGHTON Date of Exam: 03/06/2024 Medical Rec #:   [id number]        Height:       69.0 in Accession #:    [id number]       Weight:       200.6 lb Date of Birth:  04/04/1966        BSA:          2.069 m Patient Age:    58 years         BP:           95/68 mmHg Patient Gender: M                HR:           111 bpm. Exam Location:  Inpatient Procedure: Limited Echo Indications:    Follow up pericardial effusion  History:        Patient has prior history of Echocardiogram examinations.                 Arrythmias:Atrial Fibrillation.  Sonographer:    Charmaine Gaskins Referring Phys: [id number] BRIDGETTE CHRISTOPHER IMPRESSIONS  1. Moderate pericardial effusion. IVC is fixed and dilated, and unable to evaluate mitral inflow for respiratory variation due to Afib, but no RA/RV collapse seen to suggest tamponade  2. Left ventricular ejection fraction, by estimation, is 35 to 40%. The left ventricle has moderately decreased function. Compared to echo 03/06/24, patient is now in Afib with RVR and systolic function has worsened  3. Right ventricular systolic function is moderately reduced. The right ventricular size is normal.  4. The inferior vena cava is dilated in size with <50% respiratory variability, suggesting right atrial pressure of 15 mmHg. FINDINGS  Left Ventricle: Left ventricular ejection fraction, by estimation, is 35 to 40%. The left ventricle has moderately decreased function. Right Ventricle: The right ventricular size is normal. No increase in right ventricular wall thickness. Right ventricular systolic function is moderately reduced. Pericardium: A moderately sized pericardial effusion is present. Venous: The inferior vena cava is dilated in size with less than 50% respiratory variability, suggesting right atrial pressure of 15 mmHg.  LV Volumes (MOD) LV vol d, MOD A4C: 54.3 ml LV vol  s, MOD A4C: 28.6 ml LV SV MOD A4C:     54.3 ml Lonni Nanas MD Electronically signed by Lonni Nanas MD Signature Date/Time: 03/06/2024/4:16:15 PM    Final      Scheduled Meds:  azithromycin   500 mg Oral Daily   baclofen   40 mg Oral QID   colchicine   0.6 mg Oral BID   dantrolene   100 mg Oral TID   DULoxetine   60 mg Oral Daily   ibuprofen   800 mg Oral TID   metoprolol  tartrate  12.5 mg Oral Q6H   pantoprazole   40 mg Oral BID   polyethylene glycol  17 g Oral Daily   senna  3 tablet Oral Daily   tamsulosin   0.4 mg Oral QPM    Continuous Infusions:  sodium chloride  500 mL/hr at 03/05/24 2258   amiodarone  60 mg/hr (03/06/24 1320)   Followed by   amiodarone      cefTRIAXone  (ROCEPHIN )  IV Stopped (03/05/24 2155)   heparin  1,300 Units/hr (03/06/24 1332)     LOS: 2 days     Lebron JINNY Cage, MD [facility]  If 7PM-7AM, please contact night-coverage [email or web address] 03/06/2024, 7:15 PM

## 2024-03-06 NOTE — Plan of Care (Signed)
 Patient's blood pressure is soft at 81/61.  Patient is still having atrial fibrillation heart up to 120 range.  Giving 1 L of LR bolus so that it will allow to continue oral Lopressor . Currently on Lopressor  25 mg every 6 hours. After resuscitated with IV fluid once blood pressure will improve above 90/60 plan to give the oral Lopressor .  Rosalind Guido, MD [facility] 03/06/2024, 3:59 AM

## 2024-03-06 NOTE — Progress Notes (Signed)
 PHARMACY - ANTICOAGULATION CONSULT NOTE  Pharmacy Consult for heparin  Indication: atrial fibrillation  No Known Allergies  Patient Measurements: Height: 5' 9 (175.3 cm) Weight: 91 kg (200 lb 9.9 oz) IBW/kg (Calculated) : 70.7 HEPARIN  DW (KG): 89.2  Vital Signs: Temp: 97.5 F (36.4 C) (09/12 1255) Temp Source: Oral (09/12 1255) BP: 89/66 (09/12 1255) Pulse Rate: 128 (09/12 1255)  Labs: Recent Labs    03/04/24 1610 03/04/24 1610 03/04/24 2042 03/04/24 2205 03/05/24 0509 03/05/24 0846 03/05/24 1146 03/06/24 0428  HGB 13.1  --  11.9*  --  12.3*  --   --  11.2*  HCT 37.5*  --  35.2*  --  34.7*  --   --  31.5*  PLT 280  --  256  --  289  --   --  286  LABPROT  --   --  16.5*  --   --   --   --   --   INR  --   --  1.3*  --   --   --   --   --   CREATININE 0.53*  --  0.49*  --  0.63  --   --  0.60*  CKTOTAL 24*  --   --   --   --   --   --   --   TROPONINIHS  --    < > 7 6  --  6 8  --    < > = values in this interval not displayed.    Estimated Creatinine Clearance: 110.8 mL/min (A) (by C-G formula based on SCr of 0.6 mg/dL (L)).   Medical History: Past Medical History:  Diagnosis Date   Arthritis    Hip pain    Renal calculi    left   Sepsis (HCC) 06/13/2023      Assessment: 76 yoM admitted with pericardial effusion from pericarditis now with AF. CHADSVASc 1. Pharmacy to dose IV heparin .  Goal of Therapy:  Heparin  level 0.3-0.7 units/ml Monitor platelets by anticoagulation protocol: Yes   Plan:  -Heparin  5000 units x1 -Heparin  1300 units/hr -Check 6-hr heparin  level -Monitor heparin  level, CBC, S/Sx bleeding daily  Ozell Jamaica, PharmD, BCPS, [facility] Clinical Pharmacist [phone] Please check AMION for all [facility] Pharmacy numbers 03/06/2024

## 2024-03-06 NOTE — Progress Notes (Signed)
 PHARMACY - ANTICOAGULATION CONSULT NOTE  Pharmacy Consult for heparin  Indication: atrial fibrillation  No Known Allergies  Patient Measurements: Height: 5' 9 (175.3 cm) Weight: 91 kg (200 lb 9.9 oz) IBW/kg (Calculated) : 70.7 HEPARIN  DW (KG): 89.2  Vital Signs: Temp: 98.1 F (36.7 C) (09/12 1704) Temp Source: Axillary (09/12 1704) BP: 99/76 (09/12 2031) Pulse Rate: 104 (09/12 2022)  Labs: Recent Labs    03/04/24 1610 03/04/24 1610 03/04/24 2042 03/04/24 2205 03/05/24 0509 03/05/24 0846 03/05/24 1146 03/06/24 0428 03/06/24 2029  HGB 13.1  --  11.9*  --  12.3*  --   --  11.2*  --   HCT 37.5*  --  35.2*  --  34.7*  --   --  31.5*  --   PLT 280  --  256  --  289  --   --  286  --   LABPROT  --   --  16.5*  --   --   --   --   --   --   INR  --   --  1.3*  --   --   --   --   --   --   HEPARINUNFRC  --   --   --   --   --   --   --   --  <0.10*  CREATININE 0.53*  --  0.49*  --  0.63  --   --  0.60*  --   CKTOTAL 24*  --   --   --   --   --   --   --   --   TROPONINIHS  --    < > 7 6  --  6 8  --   --    < > = values in this interval not displayed.    Estimated Creatinine Clearance: 110.8 mL/min (A) (by C-G formula based on SCr of 0.6 mg/dL (L)).   Medical History: Past Medical History:  Diagnosis Date   Arthritis    Hip pain    Renal calculi    left   Sepsis (HCC) 06/13/2023      Assessment: 28 yoM admitted with pericardial effusion from pericarditis now with AF. CHADSVASc 1. Pharmacy to dose IV heparin .  Heparin  level this evening resulted as undetectable.  No issues with IV infusion per RN.  No overt bleeding or complications noted.   Goal of Therapy:  Heparin  level 0.3-0.7 units/ml Monitor platelets by anticoagulation protocol: Yes   Plan:  -Heparin  2500 units x 1 -Increase IV heparin  1550 units/hr -Check 6-hr heparin  level -Monitor heparin  level, CBC, S/Sx bleeding daily  Harlene Barlow, Berdine JONETTA CORP, [facility] Clinical Pharmacist   03/06/2024 9:24 PM   [facility] pharmacy phone numbers are listed on [email or web address]

## 2024-03-06 NOTE — Plan of Care (Signed)

## 2024-03-07 DIAGNOSIS — I300 Acute nonspecific idiopathic pericarditis: Secondary | ICD-10-CM | POA: Diagnosis not present

## 2024-03-07 DIAGNOSIS — I480 Paroxysmal atrial fibrillation: Secondary | ICD-10-CM | POA: Diagnosis not present

## 2024-03-07 DIAGNOSIS — I3139 Other pericardial effusion (noninflammatory): Secondary | ICD-10-CM | POA: Diagnosis not present

## 2024-03-07 LAB — BASIC METABOLIC PANEL WITH GFR
Anion gap: 12 (ref 5–15)
BUN: 11 mg/dL (ref 6–20)
CO2: 22 mmol/L (ref 22–32)
Calcium: 8.6 mg/dL — ABNORMAL LOW (ref 8.9–10.3)
Chloride: 102 mmol/L (ref 98–111)
Creatinine, Ser: 0.53 mg/dL — ABNORMAL LOW (ref 0.61–1.24)
GFR, Estimated: >60 mL/min (ref >60)
Glucose, Bld: 98 mg/dL (ref 70–99)
Potassium: 3.6 mmol/L (ref 3.5–5.1)
Sodium: 136 mmol/L (ref 135–145)

## 2024-03-07 LAB — HEPARIN LEVEL (UNFRACTIONATED)
Heparin Unfractionated: 0.27 [IU]/mL — ABNORMAL LOW (ref 0.30–0.70)
Heparin Unfractionated: 0.29 [IU]/mL — ABNORMAL LOW (ref 0.30–0.70)

## 2024-03-07 LAB — CBC WITH DIFFERENTIAL/PLATELET
Abs Immature Granulocytes: 0.04 K/uL (ref 0.00–0.07)
Basophils Absolute: 0 K/uL (ref 0.0–0.1)
Basophils Relative: 0 %
Eosinophils Absolute: 0.1 K/uL (ref 0.0–0.5)
Eosinophils Relative: 1 %
HCT: 31.8 % — ABNORMAL LOW (ref 39.0–52.0)
Hemoglobin: 11.3 g/dL — ABNORMAL LOW (ref 13.0–17.0)
Immature Granulocytes: 0 %
Lymphocytes Relative: 24 %
Lymphs Abs: 2.3 K/uL (ref 0.7–4.0)
MCH: 31.7 pg (ref 26.0–34.0)
MCHC: 35.5 g/dL (ref 30.0–36.0)
MCV: 89.3 fL (ref 80.0–100.0)
Monocytes Absolute: 0.9 K/uL (ref 0.1–1.0)
Monocytes Relative: 9 %
Neutro Abs: 6.2 K/uL (ref 1.7–7.7)
Neutrophils Relative %: 66 %
Platelets: 290 K/uL (ref 150–400)
RBC: 3.56 MIL/uL — ABNORMAL LOW (ref 4.22–5.81)
RDW: 13.8 % (ref 11.5–15.5)
WBC: 9.6 K/uL (ref 4.0–10.5)
nRBC: 0 % (ref 0.0–0.2)

## 2024-03-07 LAB — GLUCOSE, CAPILLARY
Glucose-Capillary: 102 mg/dL — ABNORMAL HIGH (ref 70–99)
Glucose-Capillary: 150 mg/dL — ABNORMAL HIGH (ref 70–99)
Glucose-Capillary: 128 mg/dL — ABNORMAL HIGH (ref 70–99)
Glucose-Capillary: 121 mg/dL — ABNORMAL HIGH (ref 70–99)

## 2024-03-07 LAB — URINE CULTURE: Culture: NO GROWTH

## 2024-03-07 MED ORDER — AMIODARONE HCL 200 MG PO TABS: 200.0000 mg | ORAL_TABLET | Freq: Every day | ORAL | Status: DC

## 2024-03-07 MED ORDER — AMIODARONE HCL 200 MG PO TABS
200.0000 mg | ORAL_TABLET | Freq: Two times a day (BID) | ORAL | Status: DC
Start: 2024-03-07 — End: 2024-03-10
  Administered 2024-03-07 – 2024-03-09 (×5): 200 mg via ORAL
  Filled 2024-03-07 (×5): qty 1

## 2024-03-07 NOTE — Plan of Care (Signed)
  Problem: Respiratory: Goal: Ability to maintain adequate ventilation will improve Outcome: Progressing Goal: Ability to maintain a clear airway will improve Outcome: Progressing   Problem: Pain Managment: Goal: General experience of comfort will improve and/or be controlled Outcome: Progressing

## 2024-03-07 NOTE — Progress Notes (Signed)
 PHARMACY - ANTICOAGULATION CONSULT NOTE  Pharmacy Consult for heparin  Indication: atrial fibrillation  No Known Allergies  Patient Measurements: Height: 5' 9 (175.3 cm) Weight: 91 kg (200 lb 9.9 oz) IBW/kg (Calculated) : 70.7 HEPARIN  DW (KG): 89.2  Vital Signs: Temp: 97.6 F (36.4 C) (09/13 1126) Temp Source: Oral (09/13 1126) BP: 94/75 (09/13 1126) Pulse Rate: 87 (09/13 1126)  Labs: Recent Labs    03/04/24 1610 03/04/24 1610 03/04/24 2042 03/04/24 2205 03/05/24 0509 03/05/24 0846 03/05/24 1146 03/06/24 0428 03/06/24 2029 03/07/24 0505 03/07/24 1157  HGB 13.1  --  11.9*  --  12.3*  --   --  11.2*  --  11.3*  --   HCT 37.5*  --  35.2*  --  34.7*  --   --  31.5*  --  31.8*  --   PLT 280  --  256  --  289  --   --  286  --  290  --   LABPROT  --   --  16.5*  --   --   --   --   --   --   --   --   INR  --   --  1.3*  --   --   --   --   --   --   --   --   HEPARINUNFRC  --   --   --   --   --   --   --   --  <0.10* 0.27* 0.29*  CREATININE 0.53*  --  0.49*  --  0.63  --   --  0.60*  --  0.53*  --   CKTOTAL 24*  --   --   --   --   --   --   --   --   --   --   TROPONINIHS  --    < > 7 6  --  6 8  --   --   --   --    < > = values in this interval not displayed.    Estimated Creatinine Clearance: 110.8 mL/min (A) (by C-G formula based on SCr of 0.53 mg/dL (L)).   Medical History: Past Medical History:  Diagnosis Date   Arthritis    Hip pain    Renal calculi    left   Sepsis (HCC) 06/13/2023   Assessment: 83 yoM admitted with pericardial effusion from pericarditis now with AF. CHADSVASc 1. Pharmacy to dose IV heparin . Most likely transition to DOAC 9/14 AM.   9/13 AM: Heparin  level slightly subtherapeutic on 1550 units/hr.  No issues with IV infusion or overt bleeding or complications per RN. CBC shows Hgb 11s, plts WNL  9/13 PM Update: Heparin  level remains slightly subtherapeutic on 1750 units/h. No issues with heparin  infusion reported.   Goal of  Therapy:  Heparin  level 0.3-0.7 units/ml Monitor platelets by anticoagulation protocol: Yes   Plan:  -Increase IV heparin  1950 units/hr -Check 6-hr heparin  level -Monitor heparin  level, CBC, S/Sx bleeding daily  Maurilio Patten, PharmD PGY1 Pharmacy Resident [facility] 03/07/2024 2:00 PM

## 2024-03-07 NOTE — Progress Notes (Signed)
 PROGRESS NOTE  Leon Rodriguez FMW:[id number] DOB: Apr 23, 1966 DOA: 03/04/2024 PCP: [name], MD  HPI/Recap of past 24 hours: Leon Rodriguez is a 58 y.o. 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 Patient also reports cough, but denies any fever.  In the ED, patient noted to be tachycardic heart rate 105, hypotensive BP 83/71, saturating well on room air.  Labs noted for WBC 16.4, UA negative infection.  CT chest, abdomen/pelvis showed moderate to large pericardial effusion, small bilateral pleural effusion, moderate stool burden throughout the colon.  Cardiology consulted.  Patient admitted for further management.    Today, patient denied any new complaints, chest pain has improved.  Noted to still be in A-fib RVR earlier this morning, but noted to convert to sinus rhythm.      Assessment/Plan: Principal Problem:   Pericardial effusion Active Problems:   Leukocytosis   Cigar smoker   Marijuana abuse   Paroxysmal atrial fibrillation (HCC)   Acute idiopathic pericarditis  Pericardial Effusion  CT chest showed moderate to large pericardial effusion Echo done suspect early signs of tamponade with noted moderate to large pericardial effusion, EF 65 to 70%, no regional wall motion abnormality Repeat echo done on 9/12 showed moderate pericardial effusion, no evidence to suggest tamponade.  LVEF has now decreased to 35 to 40% Cardiology on board, suspect secondary to pericarditis, no indication for pericardiocentesis at this time.  Plan to repeat echo if changing clinical status or within a week to monitor closely Telemetry  Chest pain likely 2/2 acute pericarditis Afebrile with  leukocytosis CRP elevated at 20.8, ESR elevated at 49 Troponin negative, EKG with no acute ST changes Procalcitonin 0.14 Respiratory viral panel negative Cardiology on board, recommend starting NSAIDs and colchicine  Continue ibuprofen , colchicine  Monitor closely, telemetry  Paroxysmal A-fib with RVR New onset, now rate control Possibly likely due to acute pericarditis Cardiology on board, s/p IV amiodarone  drip, switched to p.o. amiodarone , continue metoprolol  12.5 mg every 6 hours IV heparin  drip Telemetry  Possible CAP Afebrile with leukocytosis Chest imaging with possible infiltrates UA negative for infection, UC no growth Respiratory viral panel negative BC x 2 NGTD Continue ceftriaxone , azithromycin    Quadriplegia secondary to chronic C6-T1 cervical myelopathy  Noted significant lower extremity spasm Neurogenic bladder s/p ACDF 06/05/2023  Continue baclofen , dantrolene , Valium  as needed, oxycodone  as needed, Cymbalta  Continue Flomax  Continue self-catheterization Wheel chair bound  Followed by rehab medicine    Neurogenic bowel Continue on bowel regimen      Estimated body mass index is 29.63 kg/m as calculated from the following:   Height as of this encounter: 5' 9 (1.753 m).   Weight as of this encounter: 91 kg.     Code Status: Full  Family Communication: Girlfriend at bedside  Disposition Plan: Status is: Inpatient Remains inpatient appropriate because: Level of care      Consultants: Cardiology  Procedures: None  Antimicrobials: Ceftriaxone  Azithromycin   DVT prophylaxis: Heparin  drip   Objective: Vitals:   03/07/24 1100 03/07/24 1126 03/07/24 1300 03/07/24 1415  BP: (!) 112/91 94/75 105/69 101/69  Pulse: 94 87    Resp: 19 18 (!) 31 (!) 27  Temp:  97.6 F (36.4 C)  TempSrc:  Oral    SpO2: 98% 98%    Weight:      Height:        Intake/Output Summary (Last 24 hours) at 03/07/2024 1538 Last data filed at 03/07/2024 1413 Gross  per 24 hour  Intake 2003.7 ml  Output 1600 ml  Net 403.7 ml   Filed Weights   03/04/24 1520  Weight: 91 kg    Exam: General: NAD  Cardiovascular: S1, S2 present Respiratory: CTAB Abdomen: Soft, nontender, nondistended, bowel sounds present Musculoskeletal: No bilateral pedal edema noted, quadriplegic Skin: Normal Psychiatry: Normal mood   Data Reviewed: CBC: Recent Labs  Lab 03/04/24 1610 03/04/24 2042 03/05/24 0509 03/06/24 0428 03/07/24 0505  WBC 16.4* 16.2* 16.4* 11.6* 9.6  NEUTROABS 12.3*  --   --  7.7 6.2  HGB 13.1 11.9* 12.3* 11.2* 11.3*  HCT 37.5* 35.2* 34.7* 31.5* 31.8*  MCV 90.4 93.1 89.9 89.5 89.3  PLT 280 256 289 286 290   Basic Metabolic Panel: Recent Labs  Lab 03/04/24 1610 03/04/24 2042 03/05/24 0509 03/06/24 0428 03/07/24 0505  NA 132*  --  135 136 136  K 4.1  --  4.1 4.1 3.6  CL 100  --  101 102 102  CO2 22  --  22 22 22   GLUCOSE 143*  --  117* 100* 98  BUN 13  --  9 17 11   CREATININE 0.53* 0.49* 0.63 0.60* 0.53*  CALCIUM 8.6*  --  8.7* 8.5* 8.6*   GFR: Estimated Creatinine Clearance: 110.8 mL/min (A) (by C-G formula based on SCr of 0.53 mg/dL (L)). Liver Function Tests: Recent Labs  Lab 03/04/24 1610 03/05/24 0509  AST 55* 45*  ALT 42 44  ALKPHOS 129* 121  BILITOT 0.6 1.0  PROT 6.1* 6.2*  ALBUMIN  2.7* 2.5*   No results for input(s): LIPASE, AMYLASE in the last 168 hours. No results for input(s): AMMONIA in the last 168 hours. Coagulation Profile: Recent Labs  Lab 03/04/24 2042  INR 1.3*   Cardiac Enzymes: Recent Labs  Lab 03/04/24 1610  CKTOTAL 24*   BNP (last 3 results) No results for input(s): PROBNP in the last 8760 hours. HbA1C: No results for input(s): HGBA1C in the last 72 hours. CBG: Recent Labs  Lab 03/06/24 1253 03/06/24 1701 03/06/24 2223 03/07/24 0746 03/07/24 1124  GLUCAP 119* 144* 122* 102* 150*   Lipid Profile: No results for input(s): CHOL, HDL, LDLCALC, TRIG, CHOLHDL,  LDLDIRECT in the last 72 hours. Thyroid Function Tests: Recent Labs    03/04/24 2042  TSH 1.372   Anemia Panel: No results for input(s): VITAMINB12, FOLATE, FERRITIN, TIBC, IRON, RETICCTPCT in the last 72 hours. Urine analysis:    Component Value Date/Time   COLORURINE AMBER (A) 03/04/2024 1649   APPEARANCEUR CLEAR 03/04/2024 1649   LABSPEC 1.029 03/04/2024 1649   PHURINE 5.0 03/04/2024 1649   GLUCOSEU NEGATIVE 03/04/2024 1649   HGBUR NEGATIVE 03/04/2024 1649   BILIRUBINUR NEGATIVE 03/04/2024 1649   KETONESUR NEGATIVE 03/04/2024 1649   PROTEINUR 30 (A) 03/04/2024 1649   UROBILINOGEN 1.0 08/04/2009 2332   NITRITE NEGATIVE 03/04/2024 1649   LEUKOCYTESUR NEGATIVE 03/04/2024 1649   Sepsis Labs: @LABRCNTIP (procalcitonin:4,lacticidven:4)  ) Recent Results (from the past 240 hours)  Blood Culture (routine x 2)     Status: None (Preliminary result)   Collection Time: 03/04/24  4:10 PM   Specimen: BLOOD  Result Value Ref Range Status   Specimen Description BLOOD SITE NOT SPECIFIED  Final   Special Requests  Final    BOTTLES DRAWN AEROBIC AND ANAEROBIC Blood Culture results may not be optimal due to an inadequate volume of blood received in culture bottles   Culture   Final    NO GROWTH 3 DAYS Performed at [facility], [address], [place], [place] [address]    Report Status PENDING  Incomplete  Blood Culture (routine x 2)     Status: None (Preliminary result)   Collection Time: 03/04/24  4:20 PM   Specimen: BLOOD  Result Value Ref Range Status   Specimen Description BLOOD SITE NOT SPECIFIED  Final   Special Requests   Final    BOTTLES DRAWN AEROBIC AND ANAEROBIC Blood Culture results may not be optimal due to an inadequate volume of blood received in culture bottles   Culture   Final    NO GROWTH 3 DAYS Performed at [facility], [address] [place], [place] [address]    Report Status PENDING  Incomplete  Resp panel by RT-PCR (RSV, Flu  A&B, Covid) Anterior Nasal Swab     Status: None   Collection Time: 03/04/24  9:02 PM   Specimen: Anterior Nasal Swab  Result Value Ref Range Status   SARS Coronavirus 2 by RT PCR NEGATIVE NEGATIVE Final   Influenza A by PCR NEGATIVE NEGATIVE Final   Influenza B by PCR NEGATIVE NEGATIVE Final    Comment: (NOTE) The Xpert Xpress SARS-CoV-2/FLU/RSV plus assay is intended as an aid in the diagnosis of influenza from Nasopharyngeal swab specimens and should not be used as a sole basis for treatment. Nasal washings and aspirates are unacceptable for Xpert Xpress SARS-CoV-2/FLU/RSV testing.  Fact Sheet for Patients: BloggerCourse.com  Fact Sheet for Healthcare Providers: SeriousBroker.it  This test is not yet approved or cleared by the [place]  FDA and has been authorized for detection and/or diagnosis of SARS-CoV-2 by FDA under an Emergency Use Authorization (EUA). This EUA will remain in effect (meaning this test can be used) for the duration of the COVID-19 declaration under Section 564(b)(1) of the Act, 21 U.S.C. section 360bbb-3(b)(1), unless the authorization is terminated or revoked.     Resp Syncytial Virus by PCR NEGATIVE NEGATIVE Final    Comment: (NOTE) Fact Sheet for Patients: BloggerCourse.com  Fact Sheet for Healthcare Providers: SeriousBroker.it  This test is not yet approved or cleared by the [place]  FDA and has been authorized for detection and/or diagnosis of SARS-CoV-2 by FDA under an Emergency Use Authorization (EUA). This EUA will remain in effect (meaning this test can be used) for the duration of the COVID-19 declaration under Section 564(b)(1) of the Act, 21 U.S.C. section 360bbb-3(b)(1), unless the authorization is terminated or revoked.  Performed at [facility], [address], [place], [place] [address]   MRSA Next Gen by PCR, Nasal      Status: None   Collection Time: 03/04/24  9:37 PM   Specimen: Nasal Mucosa; Nasal Swab  Result Value Ref Range Status   MRSA by PCR Next Gen NOT DETECTED NOT DETECTED Final    Comment: (NOTE) The GeneXpert MRSA Assay (FDA approved for NASAL specimens only), is one component of a comprehensive MRSA colonization surveillance program. It is not intended to diagnose MRSA infection nor to guide or monitor treatment for MRSA infections. Test performance is not FDA approved in patients less than 74 years old. Performed at [facility], [address] [place], [place] [address]   Respiratory (~20 pathogens) panel by  PCR     Status: None   Collection Time: 03/05/24  6:56 AM   Specimen: Nasopharyngeal Swab; Respiratory  Result Value Ref Range Status   Adenovirus NOT DETECTED NOT DETECTED Final   Coronavirus 229E NOT DETECTED NOT DETECTED Final    Comment: (NOTE) The Coronavirus on the Respiratory Panel, DOES NOT test for the novel  Coronavirus (2019 nCoV)    Coronavirus HKU1 NOT DETECTED NOT DETECTED Final   Coronavirus NL63 NOT DETECTED NOT DETECTED Final   Coronavirus OC43 NOT DETECTED NOT DETECTED Final   Metapneumovirus NOT DETECTED NOT DETECTED Final   Rhinovirus / Enterovirus NOT DETECTED NOT DETECTED Final   Influenza A NOT DETECTED NOT DETECTED Final   Influenza B NOT DETECTED NOT DETECTED Final   Parainfluenza Virus 1 NOT DETECTED NOT DETECTED Final   Parainfluenza Virus 2 NOT DETECTED NOT DETECTED Final   Parainfluenza Virus 3 NOT DETECTED NOT DETECTED Final   Parainfluenza Virus 4 NOT DETECTED NOT DETECTED Final   Respiratory Syncytial Virus NOT DETECTED NOT DETECTED Final   Bordetella pertussis NOT DETECTED NOT DETECTED Final   Bordetella Parapertussis NOT DETECTED NOT DETECTED Final   Chlamydophila pneumoniae NOT DETECTED NOT DETECTED Final   Mycoplasma pneumoniae NOT DETECTED NOT DETECTED Final    Comment: Performed at [facility], [address]  [place], [place] [address]  Urine Culture (for pregnant, neutropenic or urologic patients or patients with an indwelling urinary catheter)     Status: None   Collection Time: 03/06/24  8:02 AM   Specimen: In/Out Cath Urine  Result Value Ref Range Status   Specimen Description IN/OUT CATH URINE  Final   Special Requests NONE  Final   Culture   Final    NO GROWTH Performed at [facility], [address], [place], [place] [address]    Report Status 03/07/2024 FINAL  Final      Studies: No results found.   Scheduled Meds:  amiodarone   200 mg Oral BID   Followed by   NOREEN ON 03/17/2024] amiodarone   200 mg Oral Daily   azithromycin   500 mg Oral Daily   baclofen   40 mg Oral QID   colchicine   0.6 mg Oral BID   dantrolene   100 mg Oral TID   DULoxetine   60 mg Oral Daily   ibuprofen   800 mg Oral TID   metoprolol  tartrate  12.5 mg Oral Q6H   pantoprazole   40 mg Oral BID   polyethylene glycol  17 g Oral Daily   senna  3 tablet Oral Daily   tamsulosin   0.4 mg Oral QPM    Continuous Infusions:  cefTRIAXone  (ROCEPHIN )  IV 2 g (03/06/24 2127)   heparin  1,950 Units/hr (03/07/24 1413)     LOS: 3 days     Lebron JINNY Cage, MD [facility]  If 7PM-7AM, please contact night-coverage [email or web address] 03/07/2024, 3:38 PM

## 2024-03-07 NOTE — Progress Notes (Signed)
  Progress Note  Patient Name: Leon Rodriguez Date of Encounter: 03/07/2024 [facility] Cardiologist: Madonna Large, DO   Interval Summary   Back in SR, but feels low energy  Vital Signs Vitals:   03/07/24 0900 03/07/24 1000 03/07/24 1100 03/07/24 1126  BP: 112/79 127/86 (!) 112/91 94/75  Pulse: (!) 129 (!) 130 94 87  Resp: 20 17 19 18   Temp:    97.6 F (36.4 C)  TempSrc:    Oral  SpO2: 98% 98% 98% 98%  Weight:      Height:        Intake/Output Summary (Last 24 hours) at 03/07/2024 1231 Last data filed at 03/07/2024 1126 Gross per 24 hour  Intake 2003.7 ml  Output 1550 ml  Net 453.7 ml      03/04/2024    3:20 PM 01/20/2024    2:55 PM 01/08/2024    1:32 PM  Last 3 Weights  Weight (lbs) 200 lb 9.9 oz -- --  Weight (kg) 91 kg -- --      Telemetry/ECG  SR - Personally Reviewed  Physical Exam  GEN: No acute distress.   Neck: No JVD Cardiac: RRR, no murmurs, rubs, or gallops.  Respiratory: Clear to auscultation bilaterally. GI: Soft, nontender, non-distended  MS: No edema  Assessment & Plan   #Pericardial effusion -moderate, with no echo evidence of tampoande. Clinically hypotensive but improved BP today.  # Chest pain # pericarditis - normal trops - pericarditis suspected, on NSAID (continue for at least 1 mo) and colchicine  (continue for at least 3 mo or until inflammatory markers normalize/symptoms resolve).  Quadriplegia 2/2 cervical myelopathy with neurogenic bowel/bladder Concern for UTI -management per primary team. Presented with fatigue, malaise, body aches, dark urine, night sweats -leukocytosis to 16, baseline 6 -UA largely unremarkable, no urine culture that I see, blood cultures NGTD   Atrial fibrillation with RVR, new -CHA2DS2/VAS Stroke Risk Points =0 -likely 2/2 inflammation from pericarditis -tried to rate control with metoprolol  but HR increased and BP decreased overnight, started IV amiodarone  -now in SR on amiodarone ,  anticipate converting to oral for short course, 200 mg Bid for 10 days then 200 mg daily.  - convert IV heparin  to doac, again can be for short course, reevaluate at follow up.   D/w patient and family at bedside, shared decision making, we decided he can be observed for another day and if stable, no further inpatient CV recommendations.    For questions or updates, please contact [facility] Please consult [email or web address] for contact info under         Signed, Kelley Polinsky A Andriel Omalley, MD

## 2024-03-07 NOTE — Progress Notes (Signed)
 PHARMACY - ANTICOAGULATION CONSULT NOTE  Pharmacy Consult for heparin  Indication: atrial fibrillation  No Known Allergies  Patient Measurements: Height: 5' 9 (175.3 cm) Weight: 91 kg (200 lb 9.9 oz) IBW/kg (Calculated) : 70.7 HEPARIN  DW (KG): 89.2  Vital Signs: Temp: 97.5 F (36.4 C) (09/13 0459) Temp Source: Oral (09/13 0459) BP: 106/73 (09/13 0459) Pulse Rate: 123 (09/13 0459)  Labs: Recent Labs    03/04/24 1610 03/04/24 1610 03/04/24 2042 03/04/24 2205 03/05/24 0509 03/05/24 0846 03/05/24 1146 03/06/24 0428 03/06/24 2029 03/07/24 0505  HGB 13.1  --  11.9*  --  12.3*  --   --  11.2*  --   --   HCT 37.5*  --  35.2*  --  34.7*  --   --  31.5*  --   --   PLT 280  --  256  --  289  --   --  286  --   --   LABPROT  --   --  16.5*  --   --   --   --   --   --   --   INR  --   --  1.3*  --   --   --   --   --   --   --   HEPARINUNFRC  --   --   --   --   --   --   --   --  <0.10* 0.27*  CREATININE 0.53*  --  0.49*  --  0.63  --   --  0.60*  --   --   CKTOTAL 24*  --   --   --   --   --   --   --   --   --   TROPONINIHS  --    < > 7 6  --  6 8  --   --   --    < > = values in this interval not displayed.    Estimated Creatinine Clearance: 110.8 mL/min (A) (by C-G formula based on SCr of 0.6 mg/dL (L)).   Medical History: Past Medical History:  Diagnosis Date   Arthritis    Hip pain    Renal calculi    left   Sepsis (HCC) 06/13/2023      Assessment: 62 yoM admitted with pericardial effusion from pericarditis now with AF. CHADSVASc 1. Pharmacy to dose IV heparin .  Heparin  level slightly subtherapeutic on 1550 units/hr.  No issues with IV infusion or overt bleeding or complications per RN. CBC shows Hgb 11s, plts WNL  Goal of Therapy:  Heparin  level 0.3-0.7 units/ml Monitor platelets by anticoagulation protocol: Yes   Plan:  -Increase IV heparin  1750 units/hr -Check 6-hr heparin  level -Monitor heparin  level, CBC, S/Sx bleeding daily  Lynwood Poplar,  PharmD, BCPS Clinical Pharmacist 03/07/2024 6:04 AM

## 2024-03-08 DIAGNOSIS — I300 Acute nonspecific idiopathic pericarditis: Secondary | ICD-10-CM | POA: Diagnosis not present

## 2024-03-08 DIAGNOSIS — I480 Paroxysmal atrial fibrillation: Secondary | ICD-10-CM | POA: Diagnosis not present

## 2024-03-08 DIAGNOSIS — I3139 Other pericardial effusion (noninflammatory): Secondary | ICD-10-CM | POA: Diagnosis not present

## 2024-03-08 LAB — CBC WITH DIFFERENTIAL/PLATELET
Abs Immature Granulocytes: 0.04 K/uL (ref 0.00–0.07)
Basophils Absolute: 0.1 K/uL (ref 0.0–0.1)
Basophils Relative: 1 %
Eosinophils Absolute: 0.1 K/uL (ref 0.0–0.5)
Eosinophils Relative: 1 %
HCT: 31 % — ABNORMAL LOW (ref 39.0–52.0)
Hemoglobin: 10.9 g/dL — ABNORMAL LOW (ref 13.0–17.0)
Immature Granulocytes: 0 %
Lymphocytes Relative: 17 %
Lymphs Abs: 1.8 K/uL (ref 0.7–4.0)
MCH: 31.4 pg (ref 26.0–34.0)
MCHC: 35.2 g/dL (ref 30.0–36.0)
MCV: 89.3 fL (ref 80.0–100.0)
Monocytes Absolute: 1 K/uL (ref 0.1–1.0)
Monocytes Relative: 10 %
Neutro Abs: 7.2 K/uL (ref 1.7–7.7)
Neutrophils Relative %: 71 %
Platelets: 321 K/uL (ref 150–400)
RBC: 3.47 MIL/uL — ABNORMAL LOW (ref 4.22–5.81)
RDW: 13.6 % (ref 11.5–15.5)
WBC: 10.2 K/uL (ref 4.0–10.5)
nRBC: 0 % (ref 0.0–0.2)

## 2024-03-08 LAB — GLUCOSE, CAPILLARY
Glucose-Capillary: 104 mg/dL — ABNORMAL HIGH (ref 70–99)
Glucose-Capillary: 129 mg/dL — ABNORMAL HIGH (ref 70–99)
Glucose-Capillary: 125 mg/dL — ABNORMAL HIGH (ref 70–99)
Glucose-Capillary: 123 mg/dL — ABNORMAL HIGH (ref 70–99)

## 2024-03-08 LAB — BASIC METABOLIC PANEL WITH GFR
Anion gap: 9 (ref 5–15)
BUN: 7 mg/dL (ref 6–20)
CO2: 24 mmol/L (ref 22–32)
Calcium: 8.7 mg/dL — ABNORMAL LOW (ref 8.9–10.3)
Chloride: 105 mmol/L (ref 98–111)
Creatinine, Ser: 0.49 mg/dL — ABNORMAL LOW (ref 0.61–1.24)
GFR, Estimated: >60 mL/min (ref >60)
Glucose, Bld: 98 mg/dL (ref 70–99)
Potassium: 3.6 mmol/L (ref 3.5–5.1)
Sodium: 138 mmol/L (ref 135–145)

## 2024-03-08 LAB — HEPARIN LEVEL (UNFRACTIONATED): Heparin Unfractionated: 0.42 [IU]/mL (ref 0.30–0.70)

## 2024-03-08 MED ORDER — GERHARDT'S BUTT CREAM
TOPICAL_CREAM | Freq: Two times a day (BID) | CUTANEOUS | Status: DC
  Filled 2024-03-08: qty 60

## 2024-03-08 MED ORDER — ORAL CARE MOUTH RINSE: 15.0000 mL | OROMUCOSAL | Status: DC | PRN

## 2024-03-08 MED ORDER — ENOXAPARIN SODIUM 40 MG/0.4ML IJ SOSY
40.0000 mg | PREFILLED_SYRINGE | INTRAMUSCULAR | Status: DC
  Administered 2024-03-09: 40 mg via SUBCUTANEOUS
  Filled 2024-03-08: qty 0.4

## 2024-03-08 NOTE — Plan of Care (Signed)
  Problem: Pain Managment: Goal: General experience of comfort will improve and/or be controlled Outcome: Progressing   Problem: Safety: Goal: Ability to remain free from injury will improve Outcome: Progressing   Problem: Skin Integrity: Goal: Risk for impaired skin integrity will decrease Outcome: Progressing

## 2024-03-08 NOTE — Progress Notes (Signed)
  Progress Note  Patient Name: Leon Rodriguez Date of Encounter: 03/08/2024 [facility] Cardiologist: Madonna Large, DO   Interval Summary   Vomited today due to nausea from food. No concern for GI illness.  Maintaining SR.   Vital Signs Vitals:   03/08/24 0327 03/08/24 0758 03/08/24 0946 03/08/24 1136  BP: 93/66 122/81 124/86 98/66  Pulse: 84  100 89  Resp: 17 19 (!) 23 19  Temp: 98.3 F (36.8 C) 98.6 F (37 C)  98.3 F (36.8 C)  TempSrc: Oral Oral  Oral  SpO2: 97% 96% 96% 96%  Weight:      Height:        Intake/Output Summary (Last 24 hours) at 03/08/2024 1155 Last data filed at 03/08/2024 0401 Gross per 24 hour  Intake 420 ml  Output 1050 ml  Net -630 ml      03/04/2024    3:20 PM 01/20/2024    2:55 PM 01/08/2024    1:32 PM  Last 3 Weights  Weight (lbs) 200 lb 9.9 oz -- --  Weight (kg) 91 kg -- --      Telemetry/ECG  SR - Personally Reviewed  Physical Exam  GEN: No acute distress.   Neck: No JVD Cardiac: RRR, no murmurs, rubs, or gallops.  Respiratory: Clear to auscultation bilaterally. GI: Soft, nontender, non-distended  MS: No edema  Assessment & Plan   #Pericardial effusion -moderate, with no echo evidence of tampoande. Mildly hypotensive but stable.   # Chest pain # pericarditis - normal trops - pericarditis suspected, on NSAID (continue for at least 1 mo) and colchicine  (continue for at least 3 mo or until inflammatory markers normalize/symptoms resolve).  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 Presented with fatigue, malaise, body aches, dark urine, night sweats -leukocytosis to 16, baseline 6 -UA largely unremarkable, no urine culture that I see, blood cultures NGTD   #Atrial fibrillation with RVR, new -CHA2DS2/VAS Stroke Risk Points =0 -likely 2/2 inflammation from pericarditis -tried to rate control with metoprolol  but HR increased and BP decreased  overnight, started IV amiodarone  -now in SR on amiodarone , anticipate converting to oral for short course, 200 mg Bid for 10 days then 200 mg daily.  - with chads2vasc 0, may not warrant ongoing AC at discharge. Maintaining SR on amiodarone .   D/w patient and family at bedside, shared decision making, we decided he can be observed for another day and if stable, no further inpatient CV recommendations.    For questions or updates, please contact [facility] Please consult [email or web address] for contact info under         Signed, Earlena Werst A Maryalyce Sanjuan, MD

## 2024-03-08 NOTE — Progress Notes (Signed)
 PROGRESS NOTE  Leon Rodriguez FMW:[id number] DOB: [birth year] DOA: 03/04/2024 PCP: [name], MD  HPI/Recap of past 24 hours: Leon Rodriguez is a 58 y.o. 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 Patient also reports cough, but denies any fever.  In the ED, patient noted to be tachycardic heart rate 105, hypotensive BP 83/71, saturating well on room air.  Labs noted for WBC 16.4, UA negative infection.  CT chest, abdomen/pelvis showed moderate to large pericardial effusion, small bilateral pleural effusion, moderate stool burden throughout the colon.  Cardiology consulted.  Patient admitted for further management.    Today, patient reports resolved chest pain, denies any shortness of breath.  Discussed with patient and girlfriend at bedside.    Assessment/Plan: Principal Problem:   Pericardial effusion Active Problems:   Leukocytosis   Cigar smoker   Marijuana abuse   Paroxysmal atrial fibrillation (HCC)   Acute idiopathic pericarditis  Pericardial Effusion  CT chest showed moderate to large pericardial effusion Echo done suspect early signs of tamponade with noted moderate to large pericardial effusion, EF 65 to 70%, no regional wall motion abnormality Repeat echo done on 9/12 showed moderate pericardial effusion, no evidence to suggest tamponade.  LVEF has now decreased to 35 to 40% Cardiology on board, suspect secondary to pericarditis, no indication for pericardiocentesis at this time.  Plan to repeat echo if changing clinical status or within a week to monitor closely Telemetry  Chest pain likely 2/2 acute pericarditis Afebrile with leukocytosis CRP elevated at 20.8, ESR elevated  at 49 Troponin negative, EKG with no acute ST changes Procalcitonin 0.14 Respiratory viral panel negative Cardiology on board, recommend starting NSAIDs and colchicine  Continue ibuprofen , colchicine  Continue Protonix  Monitor closely, telemetry  Paroxysmal A-fib with RVR New onset, now rate control Possibly likely due to acute pericarditis Cardiology on board, s/p IV amiodarone  drip, switched to p.o. 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   Weight as of this encounter: 91 kg.     Code Status: Full  Family Communication: Girlfriend at bedside  Disposition Plan: Status is: Inpatient Remains inpatient appropriate because: Level of care      Consultants: Cardiology  Procedures: None  Antimicrobials: Ceftriaxone  Azithromycin   DVT prophylaxis: Lovenox    Objective: Vitals:   03/08/24 0758 03/08/24 0946 03/08/24 1136 03/08/24 1610  BP: 122/81 124/86 98/66 111/79  Pulse:  100 89   Resp: 19 (!) 23 19 19   Temp: 98.6 F (37  C)  98.3 F (36.8 C) 98.1 F (36.7 C)  TempSrc: Oral  Oral Oral  SpO2: 96% 96% 96%   Weight:       Height:        Intake/Output Summary (Last 24 hours) at 03/08/2024 1749 Last data filed at 03/08/2024 1410 Gross per 24 hour  Intake 864.83 ml  Output 1250 ml  Net -385.17 ml   Filed Weights   03/04/24 1520  Weight: 91 kg    Exam: General: NAD  Cardiovascular: S1, S2 present Respiratory: CTAB Abdomen: Soft, nontender, nondistended, bowel sounds present Musculoskeletal: No bilateral pedal edema noted, quadriplegic Skin: Normal Psychiatry: Normal mood   Data Reviewed: CBC: Recent Labs  Lab 03/04/24 1610 03/04/24 2042 03/05/24 0509 03/06/24 0428 03/07/24 0505 03/08/24 0423  WBC 16.4* 16.2* 16.4* 11.6* 9.6 10.2  NEUTROABS 12.3*  --   --  7.7 6.2 7.2  HGB 13.1 11.9* 12.3* 11.2* 11.3* 10.9*  HCT 37.5* 35.2* 34.7* 31.5* 31.8* 31.0*  MCV 90.4 93.1 89.9 89.5 89.3 89.3  PLT 280 256 289 286 290 321   Basic Metabolic Panel: Recent Labs  Lab 03/04/24 1610 03/04/24 2042 03/05/24 0509 03/06/24 0428 03/07/24 0505 03/08/24 0423  NA 132*  --  135 136 136 138  K 4.1  --  4.1 4.1 3.6 3.6  CL 100  --  101 102 102 105  CO2 22  --  22 22 22 24   GLUCOSE 143*  --  117* 100* 98 98  BUN 13  --  9 17 11 7   CREATININE 0.53* 0.49* 0.63 0.60* 0.53* 0.49*  CALCIUM 8.6*  --  8.7* 8.5* 8.6* 8.7*   GFR: Estimated Creatinine Clearance: 110.8 mL/min (A) (by C-G formula based on SCr of 0.49 mg/dL (L)). Liver Function Tests: Recent Labs  Lab 03/04/24 1610 03/05/24 0509  AST 55* 45*  ALT 42 44  ALKPHOS 129* 121  BILITOT 0.6 1.0  PROT 6.1* 6.2*  ALBUMIN  2.7* 2.5*   No results for input(s): LIPASE, AMYLASE in the last 168 hours. No results for input(s): AMMONIA in the last 168 hours. Coagulation Profile: Recent Labs  Lab 03/04/24 2042  INR 1.3*   Cardiac Enzymes: Recent Labs  Lab 03/04/24 1610  CKTOTAL 24*   BNP (last 3 results) No results for input(s): PROBNP in the last 8760 hours. HbA1C: No results for input(s): HGBA1C in the last 72 hours. CBG: Recent  Labs  Lab 03/07/24 1641 03/07/24 2057 03/08/24 0801 03/08/24 1140 03/08/24 1613  GLUCAP 128* 121* 104* 129* 125*   Lipid Profile: No results for input(s): CHOL, HDL, LDLCALC, TRIG, CHOLHDL, LDLDIRECT in the last 72 hours. Thyroid Function Tests: No results for input(s): TSH, T4TOTAL, FREET4, T3FREE, THYROIDAB in the last 72 hours.  Anemia Panel: No results for input(s): VITAMINB12, FOLATE, FERRITIN, TIBC, IRON, RETICCTPCT in the last 72 hours. Urine analysis:    Component Value Date/Time   COLORURINE AMBER (A) 03/04/2024 1649   APPEARANCEUR CLEAR 03/04/2024 1649   LABSPEC 1.029 03/04/2024 1649   PHURINE 5.0 03/04/2024 1649   GLUCOSEU NEGATIVE 03/04/2024 1649   HGBUR NEGATIVE 03/04/2024 1649   BILIRUBINUR NEGATIVE 03/04/2024 1649   KETONESUR NEGATIVE 03/04/2024 1649   PROTEINUR 30 (A) 03/04/2024 1649   UROBILINOGEN 1.0 08/04/2009 2332   NITRITE NEGATIVE 03/04/2024 1649   LEUKOCYTESUR NEGATIVE 03/04/2024 1649   Sepsis Labs: @LABRCNTIP (procalcitonin:4,lacticidven:4)  ) Recent Results (from the past 240 hours)  Blood Culture (routine x 2)     Status: None (Preliminary result)  Collection Time: 03/04/24  4:10 PM   Specimen: BLOOD  Result Value Ref Range Status   Specimen Description BLOOD SITE NOT SPECIFIED  Final   Special Requests   Final    BOTTLES DRAWN AEROBIC AND ANAEROBIC Blood Culture results may not be optimal due to an inadequate volume of blood received in culture bottles   Culture   Final    NO GROWTH 4 DAYS Performed at [facility], [address], [place], [place] [address]    Report Status PENDING  Incomplete  Blood Culture (routine x 2)     Status: None (Preliminary result)   Collection Time: 03/04/24  4:20 PM   Specimen: BLOOD  Result Value Ref Range Status   Specimen Description BLOOD SITE NOT SPECIFIED  Final   Special Requests   Final    BOTTLES DRAWN AEROBIC AND ANAEROBIC Blood Culture results may not be  optimal due to an inadequate volume of blood received in culture bottles   Culture   Final    NO GROWTH 4 DAYS Performed at [facility], [address], [place], [place] [address]    Report Status PENDING  Incomplete  Resp panel by RT-PCR (RSV, Flu A&B, Covid) Anterior Nasal Swab     Status: None   Collection Time: 03/04/24  9:02 PM   Specimen: Anterior Nasal Swab  Result Value Ref Range Status   SARS Coronavirus 2 by RT PCR NEGATIVE NEGATIVE Final   Influenza A by PCR NEGATIVE NEGATIVE Final   Influenza B by PCR NEGATIVE NEGATIVE Final    Comment: (NOTE) The Xpert Xpress SARS-CoV-2/FLU/RSV plus assay is intended as an aid in the diagnosis of influenza from Nasopharyngeal swab specimens and should not be used as a sole basis for treatment. Nasal washings and aspirates are unacceptable for Xpert Xpress SARS-CoV-2/FLU/RSV testing.  Fact Sheet for Patients: BloggerCourse.com  Fact Sheet for Healthcare Providers: SeriousBroker.it  This test is not yet approved or cleared by the [place]  FDA and has been authorized for detection and/or diagnosis of SARS-CoV-2 by FDA under an Emergency Use Authorization (EUA). This EUA will remain in effect (meaning this test can be used) for the duration of the COVID-19 declaration under Section 564(b)(1) of the Act, 21 U.S.C. section 360bbb-3(b)(1), unless the authorization is terminated or revoked.     Resp Syncytial Virus by PCR NEGATIVE NEGATIVE Final    Comment: (NOTE) Fact Sheet for Patients: BloggerCourse.com  Fact Sheet for Healthcare Providers: SeriousBroker.it  This test is not yet approved or cleared by the [place]  FDA and has been authorized for detection and/or diagnosis of SARS-CoV-2 by FDA under an Emergency Use Authorization (EUA). This EUA will remain in effect (meaning this test can be used) for the duration of  the COVID-19 declaration under Section 564(b)(1) of the Act, 21 U.S.C. section 360bbb-3(b)(1), unless the authorization is terminated or revoked.  Performed at [facility], [address] [place], [place] [address]   MRSA Next Gen by PCR, Nasal     Status: None   Collection Time: 03/04/24  9:37 PM   Specimen: Nasal Mucosa; Nasal Swab  Result Value Ref Range Status   MRSA by PCR Next Gen NOT DETECTED NOT DETECTED Final    Comment: (NOTE) The GeneXpert MRSA Assay (FDA approved for NASAL specimens only), is one component of a comprehensive MRSA colonization surveillance program. It is not intended to diagnose MRSA infection nor to guide or monitor treatment for MRSA infections. Test  performance is not FDA approved in patients less than 39 years old. Performed at [facility], [address], [place], [place] [address]   Respiratory (~20 pathogens) panel by PCR     Status: None   Collection Time: 03/05/24  6:56 AM   Specimen: Nasopharyngeal Swab; Respiratory  Result Value Ref Range Status   Adenovirus NOT DETECTED NOT DETECTED Final   Coronavirus 229E NOT DETECTED NOT DETECTED Final    Comment: (NOTE) The Coronavirus on the Respiratory Panel, DOES NOT test for the novel  Coronavirus (2019 nCoV)    Coronavirus HKU1 NOT DETECTED NOT DETECTED Final   Coronavirus NL63 NOT DETECTED NOT DETECTED Final   Coronavirus OC43 NOT DETECTED NOT DETECTED Final   Metapneumovirus NOT DETECTED NOT DETECTED Final   Rhinovirus / Enterovirus NOT DETECTED NOT DETECTED Final   Influenza A NOT DETECTED NOT DETECTED Final   Influenza B NOT DETECTED NOT DETECTED Final   Parainfluenza Virus 1 NOT DETECTED NOT DETECTED Final   Parainfluenza Virus 2 NOT DETECTED NOT DETECTED Final   Parainfluenza Virus 3 NOT DETECTED NOT DETECTED Final   Parainfluenza Virus 4 NOT DETECTED NOT DETECTED Final   Respiratory Syncytial Virus NOT DETECTED NOT DETECTED Final   Bordetella pertussis NOT DETECTED NOT  DETECTED Final   Bordetella Parapertussis NOT DETECTED NOT DETECTED Final   Chlamydophila pneumoniae NOT DETECTED NOT DETECTED Final   Mycoplasma pneumoniae NOT DETECTED NOT DETECTED Final    Comment: Performed at [facility], [address], [place], [place] [address]  Urine Culture (for pregnant, neutropenic or urologic patients or patients with an indwelling urinary catheter)     Status: None   Collection Time: 03/06/24  8:02 AM   Specimen: In/Out Cath Urine  Result Value Ref Range Status   Specimen Description IN/OUT CATH URINE  Final   Special Requests NONE  Final   Culture   Final    NO GROWTH Performed at [facility], [address] [place], [place] [address]    Report Status 03/07/2024 FINAL  Final      Studies: No results found.   Scheduled Meds:  amiodarone   200 mg Oral BID   Followed by   NOREEN ON 03/17/2024] amiodarone   200 mg Oral Daily   baclofen   40 mg Oral QID   colchicine   0.6 mg Oral BID   dantrolene   100 mg Oral TID   DULoxetine   60 mg Oral Daily   Gerhardt's butt cream   Topical BID   ibuprofen   800 mg Oral TID   metoprolol  tartrate  12.5 mg Oral Q6H   pantoprazole   40 mg Oral BID   polyethylene glycol  17 g Oral Daily   senna  3 tablet Oral Daily   tamsulosin   0.4 mg Oral QPM    Continuous Infusions:  cefTRIAXone  (ROCEPHIN )  IV Stopped (03/07/24 2225)     LOS: 4 days     Lebron JINNY Cage, MD [facility]  If 7PM-7AM, please contact night-coverage [email or web address] 03/08/2024, 5:49 PM

## 2024-03-08 NOTE — Plan of Care (Signed)
  Problem: Respiratory: Goal: Ability to maintain adequate ventilation will improve Outcome: Progressing   Problem: Clinical Measurements: Goal: Will remain free from infection Outcome: Progressing Goal: Diagnostic test results will improve Outcome: Progressing

## 2024-03-08 NOTE — Progress Notes (Signed)
 PHARMACY - ANTICOAGULATION CONSULT NOTE  Pharmacy Consult for heparin  Indication: atrial fibrillation  No Known Allergies  Patient Measurements: Height: 5' 9 (175.3 cm) Weight: 91 kg (200 lb 9.9 oz) IBW/kg (Calculated) : 70.7 HEPARIN  DW (KG): 89.2  Vital Signs: Temp: 98.3 F (36.8 C) (09/14 0327) Temp Source: Oral (09/14 0327) BP: 93/66 (09/14 0327) Pulse Rate: 84 (09/14 0327)  Labs: Recent Labs     0000 03/05/24 0846 03/05/24 1146 03/06/24 0428 03/06/24 2029 03/07/24 0505 03/07/24 1157 03/08/24 0423  HGB   < >  --   --  11.2*  --  11.3*  --  10.9*  HCT  --   --   --  31.5*  --  31.8*  --  31.0*  PLT  --   --   --  286  --  290  --  321  HEPARINUNFRC  --   --   --   --    < > 0.27* 0.29* 0.42  CREATININE  --   --   --  0.60*  --  0.53*  --  0.49*  TROPONINIHS  --  6 8  --   --   --   --   --    < > = values in this interval not displayed.   Estimated Creatinine Clearance: 110.8 mL/min (A) (by C-G formula based on SCr of 0.49 mg/dL (L)).  Medical History: Past Medical History:  Diagnosis Date   Arthritis    Hip pain    Renal calculi    left   Sepsis (HCC) 06/13/2023   Assessment: 89 yoM admitted with pericardial effusion from pericarditis now with AF. CHADSVASc 1. Pharmacy to dose IV heparin . Hopeful transition to DOAC today.   9/14 AM HL therapeutic at 0.42 on infusion of 1950 units/h. Hgb mostly stable at 10.9 from 11.3 and platelets WNL. No issues with heparin  infusion or s/sx bleeding reported.   Goal of Therapy:  Heparin  level 0.3-0.7 units/ml Monitor platelets by anticoagulation protocol: Yes   Plan:  -Continue IV heparin  1950 units/hr until transition to DOAC. -Monitor heparin  level, CBC, S/Sx bleeding daily while on heparin .   Maurilio Patten, PharmD PGY1 Pharmacy Resident [facility] 03/08/2024 7:35 AM

## 2024-03-08 NOTE — Plan of Care (Signed)
  Problem: Activity: Goal: Ability to tolerate increased activity will improve Outcome: Progressing   Problem: Clinical Measurements: Goal: Ability to maintain a body temperature in the normal range will improve Outcome: Progressing   Problem: Respiratory: Goal: Ability to maintain adequate ventilation will improve Outcome: Progressing   

## 2024-03-09 ENCOUNTER — Other Ambulatory Visit (HOSPITAL_COMMUNITY): Payer: Self-pay

## 2024-03-09 ENCOUNTER — Other Ambulatory Visit: Payer: Self-pay | Admitting: Student

## 2024-03-09 DIAGNOSIS — I3139 Other pericardial effusion (noninflammatory): Secondary | ICD-10-CM

## 2024-03-09 LAB — MAGNESIUM: Magnesium: 2.1 mg/dL (ref 1.7–2.4)

## 2024-03-09 LAB — CULTURE, BLOOD (ROUTINE X 2)
Culture: NO GROWTH
Culture: NO GROWTH

## 2024-03-09 LAB — CBC WITH DIFFERENTIAL/PLATELET
Abs Immature Granulocytes: 0.07 K/uL (ref 0.00–0.07)
Basophils Absolute: 0 K/uL (ref 0.0–0.1)
Basophils Relative: 0 %
Eosinophils Absolute: 0.1 K/uL (ref 0.0–0.5)
Eosinophils Relative: 1 %
HCT: 30.1 % — ABNORMAL LOW (ref 39.0–52.0)
Hemoglobin: 10.5 g/dL — ABNORMAL LOW (ref 13.0–17.0)
Immature Granulocytes: 1 %
Lymphocytes Relative: 19 %
Lymphs Abs: 1.7 K/uL (ref 0.7–4.0)
MCH: 31.3 pg (ref 26.0–34.0)
MCHC: 34.9 g/dL (ref 30.0–36.0)
MCV: 89.6 fL (ref 80.0–100.0)
Monocytes Absolute: 0.9 K/uL (ref 0.1–1.0)
Monocytes Relative: 11 %
Neutro Abs: 6.1 K/uL (ref 1.7–7.7)
Neutrophils Relative %: 68 %
Platelets: 354 K/uL (ref 150–400)
RBC: 3.36 MIL/uL — ABNORMAL LOW (ref 4.22–5.81)
RDW: 13.7 % (ref 11.5–15.5)
WBC: 8.9 K/uL (ref 4.0–10.5)
nRBC: 0 % (ref 0.0–0.2)

## 2024-03-09 LAB — GLUCOSE, CAPILLARY
Glucose-Capillary: 108 mg/dL — ABNORMAL HIGH (ref 70–99)
Glucose-Capillary: 131 mg/dL — ABNORMAL HIGH (ref 70–99)
Glucose-Capillary: 134 mg/dL — ABNORMAL HIGH (ref 70–99)

## 2024-03-09 LAB — BASIC METABOLIC PANEL WITH GFR
Anion gap: 11 (ref 5–15)
BUN: 7 mg/dL (ref 6–20)
CO2: 25 mmol/L (ref 22–32)
Calcium: 8.7 mg/dL — ABNORMAL LOW (ref 8.9–10.3)
Chloride: 103 mmol/L (ref 98–111)
Creatinine, Ser: 0.51 mg/dL — ABNORMAL LOW (ref 0.61–1.24)
GFR, Estimated: >60 mL/min (ref >60)
Glucose, Bld: 95 mg/dL (ref 70–99)
Potassium: 3.3 mmol/L — ABNORMAL LOW (ref 3.5–5.1)
Sodium: 139 mmol/L (ref 135–145)

## 2024-03-09 LAB — HEPARIN LEVEL (UNFRACTIONATED): Heparin Unfractionated: <0.1 [IU]/mL — ABNORMAL LOW (ref 0.30–0.70)

## 2024-03-09 MED ORDER — IBUPROFEN 800 MG PO TABS
800.0000 mg | ORAL_TABLET | Freq: Three times a day (TID) | ORAL | 0 refills | Status: AC
  Filled 2024-03-09: qty 75, 25d supply, fill #0

## 2024-03-09 MED ORDER — ALBUMIN HUMAN 25 % IV SOLN
25.0000 g | INTRAVENOUS | Status: AC
  Administered 2024-03-09: 25 g via INTRAVENOUS
  Filled 2024-03-09: qty 100

## 2024-03-09 MED ORDER — POTASSIUM CHLORIDE CRYS ER 20 MEQ PO TBCR
40.0000 meq | EXTENDED_RELEASE_TABLET | Freq: Two times a day (BID) | ORAL | Status: AC
  Administered 2024-03-09: 40 meq via ORAL
  Filled 2024-03-09: qty 2

## 2024-03-09 MED ORDER — LACTATED RINGERS IV BOLUS
500.0000 mL | Freq: Once | INTRAVENOUS | Status: AC
Start: 2024-03-09 — End: 2024-03-09
  Administered 2024-03-09: 500 mL via INTRAVENOUS

## 2024-03-09 MED ORDER — PANTOPRAZOLE SODIUM 40 MG PO TBEC
40.0000 mg | DELAYED_RELEASE_TABLET | Freq: Two times a day (BID) | ORAL | 0 refills | Status: DC
  Filled 2024-03-09: qty 60, 30d supply, fill #0

## 2024-03-09 MED ORDER — AMIODARONE HCL 200 MG PO TABS
ORAL_TABLET | ORAL | 0 refills | Status: DC
  Filled 2024-03-09: qty 46, 38d supply, fill #0

## 2024-03-09 MED ORDER — COLCHICINE 0.6 MG PO TABS
0.6000 mg | ORAL_TABLET | Freq: Two times a day (BID) | ORAL | 0 refills | Status: DC
  Filled 2024-03-09: qty 60, 30d supply, fill #0

## 2024-03-09 NOTE — Plan of Care (Signed)
  Problem: Activity: Goal: Ability to tolerate increased activity will improve Outcome: Completed/Met   Problem: Clinical Measurements: Goal: Ability to maintain a body temperature in the normal range will improve Outcome: Completed/Met   Problem: Respiratory: Goal: Ability to maintain adequate ventilation will improve Outcome: Completed/Met Goal: Ability to maintain a clear airway will improve Outcome: Completed/Met   Problem: Education: Goal: Knowledge of General Education information will improve Description: Including pain rating scale, medication(s)/side effects and non-pharmacologic comfort measures Outcome: Completed/Met   Problem: Health Behavior/Discharge Planning: Goal: Ability to manage health-related needs will improve Outcome: Completed/Met   Problem: Clinical Measurements: Goal: Ability to maintain clinical measurements within normal limits will improve Outcome: Completed/Met Goal: Will remain free from infection Outcome: Completed/Met Goal: Diagnostic test results will improve Outcome: Completed/Met Goal: Respiratory complications will improve Outcome: Completed/Met Goal: Cardiovascular complication will be avoided Outcome: Completed/Met   Problem: Activity: Goal: Risk for activity intolerance will decrease Outcome: Completed/Met   Problem: Nutrition: Goal: Adequate nutrition will be maintained Outcome: Completed/Met   Problem: Coping: Goal: Level of anxiety will decrease Outcome: Completed/Met   Problem: Elimination: Goal: Will not experience complications related to bowel motility Outcome: Completed/Met Goal: Will not experience complications related to urinary retention Outcome: Completed/Met   Problem: Pain Managment: Goal: General experience of comfort will improve and/or be controlled Outcome: Completed/Met   Problem: Safety: Goal: Ability to remain free from injury will improve Outcome: Completed/Met   Problem: Skin Integrity: Goal:  Risk for impaired skin integrity will decrease Outcome: Completed/Met

## 2024-03-09 NOTE — Progress Notes (Signed)
 Outpatient Echocardiogram to reevaluate pericardial effusion

## 2024-03-09 NOTE — Progress Notes (Signed)
 AVS completed for discharge packet.

## 2024-03-09 NOTE — Progress Notes (Signed)
 Pt was hypotensive this AM, SBP was in 80s, paged attending and got some orders( see MAR), SBP now is above 100, will update dayshift nurse and continue to monitor. Thanks!

## 2024-03-09 NOTE — Discharge Summary (Addendum)
 Physician Discharge Summary   Patient: Leon Rodriguez MRN: [id number] DOB: Nov 23, 1965  Admit date:     03/04/2024  Discharge date: 03/09/24  Discharge Physician: Lebron JINNY Cage   PCP: [name], MD   Recommendations at discharge:   Follow-up with PCP in 1 week Follow-up with cardiology as scheduled  Discharge Diagnoses: Principal Problem:   Pericardial effusion Active Problems:   Leukocytosis   Cigar smoker   Marijuana abuse   Paroxysmal atrial fibrillation (HCC)   Acute idiopathic pericarditis    Hospital Course: SHANDY VI is a 58 y.o. 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 Patient also reports cough, but denies any fever.  In the ED, patient noted to be tachycardic heart rate 105, hypotensive BP 83/71, saturating well on room air.  Labs noted for WBC 16.4, UA negative infection.  CT chest, abdomen/pelvis showed moderate to large pericardial effusion, small bilateral pleural effusion, moderate stool burden throughout the colon.  Cardiology consulted.  Patient admitted for further management.    Overnight, patient noted to be hypotensive, likely medication induced.  Denies any dizziness, shortness of breath, chest pain, palpitations, fever/chills, diarrhea.  Patient stable to discharge with follow-up with cardiology as well as PCP in 1 week to repeat labs    Assessment and Plan:  Pericardial Effusion  CT chest showed moderate to large pericardial effusion Echo done suspect early signs of tamponade with noted moderate to large pericardial effusion, EF 65 to 70%, no regional wall motion abnormality Repeat echo done on 9/12 showed moderate pericardial effusion, no  evidence to suggest tamponade.  LVEF has now decreased to 35 to 40% Cardiology on board, suspect secondary to pericarditis, never required pericardiocentesis Outpatient follow-up with cardiology, for repeat echo and further management   Chest pain likely 2/2 acute pericarditis Afebrile with resolved leukocytosis CRP elevated at 20.8, ESR elevated at 49 Troponin negative, EKG with no acute ST changes Procalcitonin 0.14 Respiratory viral panel negative Cardiology recommend NSAIDs x 30 days and colchicine  x 3 months Continue ibuprofen , colchicine  Continue Protonix  while on ibuprofen  Patient follow-up with cardiology   Paroxysmal A-fib with RVR New onset, now rate control Possibly likely due to acute pericarditis Cardiology on board, s/p IV amiodarone  drip, switched to p.o. amiodarone  200 mg twice daily x 10 days, then 200 mg daily S/p IV heparin  drip, no further anticoagulation needs as patient's CHA2DS2-VASc score is 0-1  Acute systolic HF Likely 2/2 A-fib with RVR Echo as above Follow-up with cardiology with repeat echo   Sepsis 2/2 CAP Met sepsis criteria upon admission Chest imaging with possible infiltrates UA negative for infection, UC no growth Respiratory viral panel negative BC x 2 NGTD Completed ceftriaxone , azithromycin  x 5 days  Hypokalemia Replaced as needed   Quadriplegia secondary to chronic C6-T1 cervical myelopathy  Noted significant lower extremity spasm Neurogenic bladder s/p ACDF 06/05/2023  Continue baclofen , dantrolene , Valium  as needed, oxycodone  as needed, Cymbalta  Continue Flomax  Continue self-catheterization Wheel chair bound  Followed by rehab medicine, continue outpatient follow-up   Neurogenic bowel Continue on bowel regimen  Pressure injury left heel  Unstageable, present on admission       Pain control -   Controlled Substance Reporting System database  was reviewed. and patient was instructed, not to drive, operate heavy  machinery, perform activities at heights, swimming or participation in water activities or provide baby-sitting services while on Pain, Sleep and Anxiety Medications; until their outpatient Physician has advised to do so again. Also recommended to not to take more than prescribed Pain, Sleep and Anxiety Medications.    Consultants: Cardiology Procedures performed: None Disposition: Home Diet recommendation:  Cardiac diet   DISCHARGE MEDICATION: Allergies as of 03/09/2024   No Known Allergies      Medication List     TAKE these medications    acetaminophen  325 MG tablet Commonly known as: TYLENOL  Take 2 tablets (650 mg total) by mouth every 4 (four) hours as needed for fever or mild pain (pain score 1-3).   amiodarone  200 MG tablet Commonly known as: PACERONE  Take 1 tablet (200 mg total) by mouth 2 (two) times daily for 8 days, THEN 1 tablet (200 mg total) daily. Start taking on: March 09, 2024   ascorbic acid  1000 MG tablet Commonly known as: VITAMIN C  Take 1 tablet (1,000 mg total) by mouth daily.   baclofen  20 MG tablet Commonly known as: LIORESAL  Take 2 tablets (40 mg total) by mouth 4 (four) times daily. 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   dantrolene  100 MG capsule Commonly known as: DANTRIUM  Take 1 capsule (100 mg total) by mouth 3 (three) times daily.   diazepam  10 MG tablet Commonly known as: VALIUM  Take 0.5-1 tablets (5-10 mg total) by mouth every 6 (six) hours as needed for muscle spasms (severe spasticity).   DULoxetine  60 MG capsule Commonly known as: CYMBALTA  Take 1 capsule (60 mg total) by mouth daily.   Enemeez Mini 283 MG/5ML enema Generic drug: docusate sodium  Place 1 enema (283 mg total) rectally daily as needed for severe constipation.   ibuprofen  800 MG tablet Commonly known as: ADVIL  Take 1 tablet (800 mg total) by mouth 3 (three) times daily for 25 days.    Jelcaine Sterile 2 % jelly Generic drug: lidocaine  Apply topically as needed (Use with in and out catheter).   lidocaine  5 % Commonly known as: Lidoderm  Place 2 patches onto the skin daily. For chronic back pain- and hip pain- to reduce amount of opiates giving pt-Remove & Discard patch within 12 hours or as directed by MD   morphine  15 MG tablet Commonly known as: MSIR Take 1 tablet (15 mg total) by mouth every 6 (six) hours as needed for severe pain (pain score 7-10).   oxyCODONE  15 MG immediate release tablet Commonly known as: Roxicodone  Take 1 tablet (15 mg total) by mouth every 6 (six) hours as needed for severe pain (pain score 7-10).   pantoprazole  40 MG tablet Commonly known as: PROTONIX  Take 1 tablet (40 mg total) by mouth 2 (two) times daily.   polyethylene glycol powder 17 GM/SCOOP powder Commonly known as: MiraLax  Mix 17 g in 4 oz of liquid & take by mouth daily.   QUEtiapine  50 MG tablet Commonly known as: SEROQUEL  Take 1 tablet (50 mg total) by mouth at bedtime.   Senokot 8.6 MG tablet Generic drug: senna Take 3 tablets by mouth daily.   tamsulosin  0.4 MG Caps capsule Commonly known as: FLOMAX  Take 1 capsule (0.4 mg total) by mouth every evening.   tiZANidine  2 MG tablet Commonly known as: ZANAFLEX  Take 1 tablet (2 mg total) by  mouth 3 (three) times daily.        Follow-up Information     Tolia, Sunit, DO Follow up.   Specialties: Cardiology, Vascular Surgery Why: Hospital follow-up with Cardiology scheduled for 03/23/2024 at 8am. Please arrive 20 minutes early for check-in. If this date/ time does not work for you, please call our office to reschedule. Contact information: [address] [place] [address] [phone]         [name], MD. Schedule an appointment as soon as possible for a visit in 1 week(s).   Specialty: Internal Medicine Contact information: [address] [place] [address] [phone]                 Discharge Exam: Fredricka Weights   03/04/24 1520  Weight: 91 kg   General: NAD  Cardiovascular: S1, S2 present Respiratory: CTAB Abdomen: Soft, nontender, nondistended, bowel sounds present Musculoskeletal: No bilateral pedal edema noted, quadriplegic Skin: Normal Psychiatry: Normal mood   Condition at discharge: stable  The results of significant diagnostics from this hospitalization (including imaging, microbiology, ancillary and laboratory) are listed below for reference.   Imaging Studies: ECHOCARDIOGRAM LIMITED Result Date: 03/06/2024    ECHOCARDIOGRAM LIMITED REPORT   Patient Name:   DORELL GATLIN Date of Exam: 03/06/2024 Medical Rec #:  [id number]        Height:       69.0 in Accession #:    [id number]       Weight:       200.6 lb Date of Birth:  [birth year]        BSA:          2.069 m Patient Age:    58 years         BP:           95/68 mmHg Patient Gender: M                HR:           111 bpm. Exam Location:  Inpatient Procedure: Limited Echo Indications:    Follow up pericardial effusion  History:        Patient has prior history of Echocardiogram examinations.                 Arrythmias:Atrial Fibrillation.  Sonographer:    Charmaine Gaskins Referring Phys: [id number] BRIDGETTE CHRISTOPHER IMPRESSIONS  1. Moderate pericardial effusion. IVC is fixed and dilated, and unable to evaluate mitral inflow for respiratory variation due to Afib, but no RA/RV collapse seen to suggest tamponade  2. Left ventricular ejection fraction, by estimation, is 35 to 40%. The left ventricle has moderately decreased function. Compared to echo 03/06/24, patient is now in Afib with RVR and systolic function has worsened  3. Right ventricular systolic function is moderately reduced. The right ventricular size is normal.  4. The inferior vena cava is dilated in size with <50% respiratory variability, suggesting right atrial pressure of 15 mmHg. FINDINGS  Left Ventricle: Left ventricular ejection  fraction, by estimation, is 35 to 40%. The left ventricle has moderately decreased function. Right Ventricle: The right ventricular size is normal. No increase in right ventricular wall thickness. Right ventricular systolic function is moderately reduced. Pericardium: A moderately sized pericardial effusion is present. Venous: The inferior vena cava is dilated in size with less than 50% respiratory variability, suggesting right atrial pressure of 15 mmHg.  LV Volumes (MOD) LV vol d, MOD A4C: 54.3 ml LV vol s, MOD A4C:  28.6 ml LV SV MOD A4C:     54.3 ml Lonni Nanas MD Electronically signed by Lonni Nanas MD Signature Date/Time: 03/06/2024/4:16:15 PM    Final    ECHOCARDIOGRAM COMPLETE Result Date: 03/05/2024    ECHOCARDIOGRAM REPORT   Patient Name:   GARNETT NUNZIATA Date of Exam: 03/05/2024 Medical Rec #:  [id number]        Height:       69.0 in Accession #:    [id number]       Weight:       200.6 lb Date of Birth:  1966-03-25        BSA:          2.069 m Patient Age:    58 years         BP:           118/86 mmHg Patient Gender: M                HR:           113 bpm. Exam Location:  Inpatient Procedure: 2D Echo, Cardiac Doppler and Color Doppler (Both Spectral and Color            Flow Doppler were utilized during procedure). Indications:    Pericardial Effusion  History:        Patient has no prior history of Echocardiogram examinations.  Sonographer:    Therisa Crouch Referring Phys: DAYNA N DUNN IMPRESSIONS  1. IVC dilated with no inspiratory collapse. Early diastolic collapse of the right atrium. Although no siginificant collapse of right ventricle, this does not appear to have full relaxation - consistent with increased intrapericardial pressure. Respiratory variations for MV and TV does not meet criteria. Suspect early signs of tamponande, will need close echo follow up.. moderate to large. The pericardial effusion is circumferential.  2. Left ventricular ejection fraction, by estimation, is  65 to 70%. The left ventricle has normal function. The left ventricle has no regional wall motion abnormalities. There is mild concentric left ventricular hypertrophy. Left ventricular diastolic parameters are indeterminate.  3. Right ventricular systolic function is normal. The right ventricular size is normal.  4. The mitral valve is normal in structure. No evidence of mitral valve regurgitation. No evidence of mitral stenosis.  5. The aortic valve is normal in structure. Aortic valve regurgitation is not visualized. No aortic stenosis is present.  6. The inferior vena cava is dilated in size with <50% respiratory variability, suggesting right atrial pressure of 15 mmHg. FINDINGS  Left Ventricle: Left ventricular ejection fraction, by estimation, is 65 to 70%. The left ventricle has normal function. The left ventricle has no regional wall motion abnormalities. The left ventricular internal cavity size was normal in size. There is  mild concentric left ventricular hypertrophy. Left ventricular diastolic parameters are indeterminate. Right Ventricle: The right ventricular size is normal. No increase in right ventricular wall thickness. Right ventricular systolic function is normal. Left Atrium: Left atrial size was normal in size. Right Atrium: Right atrial size was normal in size. Pericardium: IVC dilated with no inspiratory collapse. Early diastolic collapse of the right atrium. Although no siginificant collapse of right ventricle, this does not appear to have full relaxation - consistent with increased intrapericardial pressure.  Respiratory variations for MV and TV does not meet criteria. Suspect early signs of tamponande, will need close echo follow up. Moderate to large. The pericardial effusion is circumferential. There is diastolic collapse of the right atrial wall. Mitral Valve: The mitral  valve is normal in structure. No evidence of mitral valve regurgitation. No evidence of mitral valve stenosis.  Tricuspid Valve: The tricuspid valve is normal in structure. Tricuspid valve regurgitation is not demonstrated. No evidence of tricuspid stenosis. Aortic Valve: The aortic valve is normal in structure. Aortic valve regurgitation is not visualized. No aortic stenosis is present. Aortic valve mean gradient measures 4.0 mmHg. Aortic valve peak gradient measures 7.4 mmHg. Aortic valve area, by VTI measures 4.00 cm. Pulmonic Valve: The pulmonic valve was normal in structure. Pulmonic valve regurgitation is trivial. No evidence of pulmonic stenosis. Aorta: The aortic root is normal in size and structure. Venous: The inferior vena cava is dilated in size with less than 50% respiratory variability, suggesting right atrial pressure of 15 mmHg. IAS/Shunts: No atrial level shunt detected by color flow Doppler.  LEFT VENTRICLE PLAX 2D LVIDd:         4.00 cm   Diastology LVIDs:         2.60 cm   LV e' medial:    11.40 cm/s LV PW:         1.00 cm   LV E/e' medial:  6.0 LV IVS:        1.10 cm   LV e' lateral:   9.46 cm/s LVOT diam:     2.10 cm   LV E/e' lateral: 7.2 LV SV:         67 LV SV Index:   32 LVOT Area:     3.46 cm  RIGHT VENTRICLE             IVC RV Basal diam:  3.10 cm     IVC diam: 2.20 cm RV S prime:     12.70 cm/s TAPSE (M-mode): 2.2 cm LEFT ATRIUM             Index        RIGHT ATRIUM           Index LA diam:        2.30 cm 1.11 cm/m   RA Area:     18.40 cm LA Vol (A2C):   49.4 ml 23.88 ml/m  RA Volume:   51.80 ml  25.04 ml/m LA Vol (A4C):   41.4 ml 20.01 ml/m LA Biplane Vol: 46.8 ml 22.62 ml/m  AORTIC VALVE AV Area (Vmax):    3.23 cm AV Area (Vmean):   3.61 cm AV Area (VTI):     4.00 cm AV Vmax:           136.00 cm/s AV Vmean:          95.900 cm/s AV VTI:            0.167 m AV Peak Grad:      7.4 mmHg AV Mean Grad:      4.0 mmHg LVOT Vmax:         127.00 cm/s LVOT Vmean:        100.000 cm/s LVOT VTI:          0.193 m LVOT/AV VTI ratio: 1.16  AORTA Ao Root diam: 3.10 cm Ao Asc diam:  3.20 cm MITRAL VALVE  MV Area (PHT): 6.48 cm    SHUNTS MV Decel Time: 117 msec    Systemic VTI:  0.19 m MV E velocity: 68.10 cm/s  Systemic Diam: 2.10 cm MV A velocity: 70.20 cm/s MV E/A ratio:  0.97 Kardie Tobb DO Electronically signed by Dub Huntsman DO Signature Date/Time: 03/05/2024/10:17:49 AM    Final  CT CHEST ABDOMEN PELVIS W CONTRAST Result Date: 03/04/2024 CLINICAL DATA:  Sepsis. Chest and abdominal pain. Shortness of breath, fatigue, weakness EXAM: CT CHEST, ABDOMEN, AND PELVIS WITH CONTRAST TECHNIQUE: Multidetector CT imaging of the chest, abdomen and pelvis was performed following the standard protocol during bolus administration of intravenous contrast. RADIATION DOSE REDUCTION: This exam was performed according to the departmental dose-optimization program which includes automated exposure control, adjustment of the mA and/or kV according to patient size and/or use of iterative reconstruction technique. CONTRAST:  75mL OMNIPAQUE  IOHEXOL  350 MG/ML SOLN COMPARISON:  06/13/2023 FINDINGS: CT CHEST FINDINGS Cardiovascular: Moderate to large pericardial effusion. Heart is normal size. Aorta normal caliber. Scattered aortic calcifications. Mediastinum/Nodes: No mediastinal, hilar, or axillary adenopathy. Trachea and esophagus are unremarkable. Thyroid unremarkable. Lungs/Pleura: Small bilateral pleural effusions, left greater than right. Left basilar airspace opacity, favor atelectasis although pneumonia cannot be completely excluded. Linear atelectasis in the right lung base. Musculoskeletal: Chest wall soft tissues are unremarkable. No acute bony abnormality. CT ABDOMEN PELVIS FINDINGS Hepatobiliary: No focal hepatic abnormality. Gallbladder unremarkable. Pancreas: No focal abnormality or ductal dilatation. Spleen: No focal abnormality.  Normal size. Adrenals/Urinary Tract: No adrenal abnormality. No focal renal abnormality. No stones or hydronephrosis. Urinary bladder is unremarkable. Stomach/Bowel: Moderate stool burden  throughout the colon. Stomach, large and small bowel grossly unremarkable. Prior appendectomy. Vascular/Lymphatic: No evidence of aneurysm or adenopathy. Reproductive: No visible focal abnormality. Other: No free fluid or free air. Musculoskeletal: No acute bony abnormality. IMPRESSION: Moderate to large pericardial effusion, new since prior study. Small bilateral pleural effusions, left greater than right. Minimal right base atelectasis. Left basilar atelectasis or infiltrate. No acute findings in the abdomen or pelvis. Moderate stool burden throughout the colon. Electronically Signed   By: Franky Crease M.D.   On: 03/04/2024 19:09   DG Chest Port 1 View Result Date: 03/04/2024 CLINICAL DATA:  Fatigue and malaise. EXAM: PORTABLE CHEST 1 VIEW COMPARISON:  July 22, 2023 FINDINGS: The cardiac silhouette is borderline in size. This may be technical in origin. Mild atelectasis and/or infiltrate is seen within the left lung base. There is a small left pleural effusion. No pneumothorax is identified. A stable, benign-appearing sclerotic lesion is seen within the neck of the proximal right humerus. Postoperative changes are present throughout the cervical spine. IMPRESSION: 1. Mild left basilar atelectasis and/or infiltrate. 2. Small left pleural effusion. Electronically Signed   By: Suzen Dials M.D.   On: 03/04/2024 15:55    Microbiology: Results for orders placed or performed during the hospital encounter of 03/04/24  Blood Culture (routine x 2)     Status: None   Collection Time: 03/04/24  4:10 PM   Specimen: BLOOD  Result Value Ref Range Status   Specimen Description BLOOD SITE NOT SPECIFIED  Final   Special Requests   Final    BOTTLES DRAWN AEROBIC AND ANAEROBIC Blood Culture results may not be optimal due to an inadequate volume of blood received in culture bottles   Culture   Final    NO GROWTH 5 DAYS Performed at [facility], [address], [place], [place] [address]    Report  Status 03/09/2024 FINAL  Final  Blood Culture (routine x 2)     Status: None   Collection Time: 03/04/24  4:20 PM   Specimen: BLOOD  Result Value Ref Range Status   Specimen Description BLOOD SITE NOT SPECIFIED  Final   Special Requests   Final    BOTTLES DRAWN AEROBIC AND ANAEROBIC  Blood Culture results may not be optimal due to an inadequate volume of blood received in culture bottles   Culture   Final    NO GROWTH 5 DAYS Performed at [facility], [address], [place], [place] [address]    Report Status 03/09/2024 FINAL  Final  Resp panel by RT-PCR (RSV, Flu A&B, Covid) Anterior Nasal Swab     Status: None   Collection Time: 03/04/24  9:02 PM   Specimen: Anterior Nasal Swab  Result Value Ref Range Status   SARS Coronavirus 2 by RT PCR NEGATIVE NEGATIVE Final   Influenza A by PCR NEGATIVE NEGATIVE Final   Influenza B by PCR NEGATIVE NEGATIVE Final    Comment: (NOTE) The Xpert Xpress SARS-CoV-2/FLU/RSV plus assay is intended as an aid in the diagnosis of influenza from Nasopharyngeal swab specimens and should not be used as a sole basis for treatment. Nasal washings and aspirates are unacceptable for Xpert Xpress SARS-CoV-2/FLU/RSV testing.  Fact Sheet for Patients: BloggerCourse.com  Fact Sheet for Healthcare Providers: SeriousBroker.it  This test is not yet approved or cleared by the [place]  FDA and has been authorized for detection and/or diagnosis of SARS-CoV-2 by FDA under an Emergency Use Authorization (EUA). This EUA will remain in effect (meaning this test can be used) for the duration of the COVID-19 declaration under Section 564(b)(1) of the Act, 21 U.S.C. section 360bbb-3(b)(1), unless the authorization is terminated or revoked.     Resp Syncytial Virus by PCR NEGATIVE NEGATIVE Final    Comment: (NOTE) Fact Sheet for Patients: BloggerCourse.com  Fact Sheet for Healthcare  Providers: SeriousBroker.it  This test is not yet approved or cleared by the [place]  FDA and has been authorized for detection and/or diagnosis of SARS-CoV-2 by FDA under an Emergency Use Authorization (EUA). This EUA will remain in effect (meaning this test can be used) for the duration of the COVID-19 declaration under Section 564(b)(1) of the Act, 21 U.S.C. section 360bbb-3(b)(1), unless the authorization is terminated or revoked.  Performed at [facility], [address], [place], [place] [address]   MRSA Next Gen by PCR, Nasal     Status: None   Collection Time: 03/04/24  9:37 PM   Specimen: Nasal Mucosa; Nasal Swab  Result Value Ref Range Status   MRSA by PCR Next Gen NOT DETECTED NOT DETECTED Final    Comment: (NOTE) The GeneXpert MRSA Assay (FDA approved for NASAL specimens only), is one component of a comprehensive MRSA colonization surveillance program. It is not intended to diagnose MRSA infection nor to guide or monitor treatment for MRSA infections. Test performance is not FDA approved in patients less than 71 years old. Performed at [facility], [address] [place], [place] [address]   Respiratory (~20 pathogens) panel by PCR     Status: None   Collection Time: 03/05/24  6:56 AM   Specimen: Nasopharyngeal Swab; Respiratory  Result Value Ref Range Status   Adenovirus NOT DETECTED NOT DETECTED Final   Coronavirus 229E NOT DETECTED NOT DETECTED Final    Comment: (NOTE) The Coronavirus on the Respiratory Panel, DOES NOT test for the novel  Coronavirus (2019 nCoV)    Coronavirus HKU1 NOT DETECTED NOT DETECTED Final   Coronavirus NL63 NOT DETECTED NOT DETECTED Final   Coronavirus OC43 NOT DETECTED NOT DETECTED Final   Metapneumovirus NOT DETECTED NOT DETECTED Final   Rhinovirus / Enterovirus NOT DETECTED NOT DETECTED Final   Influenza A NOT DETECTED NOT  DETECTED Final   Influenza B NOT DETECTED NOT DETECTED Final    Parainfluenza Virus 1 NOT DETECTED NOT DETECTED Final   Parainfluenza Virus 2 NOT DETECTED NOT DETECTED Final   Parainfluenza Virus 3 NOT DETECTED NOT DETECTED Final   Parainfluenza Virus 4 NOT DETECTED NOT DETECTED Final   Respiratory Syncytial Virus NOT DETECTED NOT DETECTED Final   Bordetella pertussis NOT DETECTED NOT DETECTED Final   Bordetella Parapertussis NOT DETECTED NOT DETECTED Final   Chlamydophila pneumoniae NOT DETECTED NOT DETECTED Final   Mycoplasma pneumoniae NOT DETECTED NOT DETECTED Final    Comment: Performed at [facility], [address], [place], [place] [address]  Urine Culture (for pregnant, neutropenic or urologic patients or patients with an indwelling urinary catheter)     Status: None   Collection Time: 03/06/24  8:02 AM   Specimen: In/Out Cath Urine  Result Value Ref Range Status   Specimen Description IN/OUT CATH URINE  Final   Special Requests NONE  Final   Culture   Final    NO GROWTH Performed at [facility], [address] [place], [place] [address]    Report Status 03/07/2024 FINAL  Final    Labs: CBC: Recent Labs  Lab 03/04/24 1610 03/04/24 2042 03/05/24 0509 03/06/24 0428 03/07/24 0505 03/08/24 0423 03/09/24 0519  WBC 16.4*   < > 16.4* 11.6* 9.6 10.2 8.9  NEUTROABS 12.3*  --   --  7.7 6.2 7.2 6.1  HGB 13.1   < > 12.3* 11.2* 11.3* 10.9* 10.5*  HCT 37.5*   < > 34.7* 31.5* 31.8* 31.0* 30.1*  MCV 90.4   < > 89.9 89.5 89.3 89.3 89.6  PLT 280   < > 289 286 290 321 354   < > = values in this interval not displayed.   Basic Metabolic Panel: Recent Labs  Lab 03/05/24 0509 03/06/24 0428 03/07/24 0505 03/08/24 0423 03/09/24 0519  NA 135 136 136 138 139  K 4.1 4.1 3.6 3.6 3.3*  CL 101 102 102 105 103  CO2 22 22 22 24 25   GLUCOSE 117* 100* 98 98 95  BUN 9 17 11 7 7   CREATININE 0.63 0.60* 0.53* 0.49* 0.51*  CALCIUM 8.7* 8.5* 8.6* 8.7* 8.7*  MG  --   --   --   --  2.1   Liver Function Tests: Recent Labs  Lab  03/04/24 1610 03/05/24 0509  AST 55* 45*  ALT 42 44  ALKPHOS 129* 121  BILITOT 0.6 1.0  PROT 6.1* 6.2*  ALBUMIN  2.7* 2.5*   CBG: Recent Labs  Lab 03/08/24 1140 03/08/24 1613 03/08/24 2046 03/09/24 0739 03/09/24 1209  GLUCAP 129* 125* 123* 108* 131*    Discharge time spent: greater than 30 minutes.  Signed: Lebron JINNY Cage, MD [facility] 03/09/2024

## 2024-03-09 NOTE — Procedures (Addendum)
 Progress Note  Patient Name: Leon Rodriguez Date of Encounter: 03/09/2024 [facility] Cardiologist: Leon Large, DO   Interval Summary   Laying down comfortably on bed during interview.  Denies any chest pain or shortness of breath.  Was nauseous yesterday and feels like that was because his breakfast did not agree with him.  Feeling better today.  Vital Signs Vitals:   03/09/24 0719 03/09/24 0720 03/09/24 0721 03/09/24 0722  BP:    105/66  Pulse: 82 81 81 82  Resp: 20 19 19 20   Temp:      TempSrc:      SpO2: 95% 94% 96% 91%  Weight:      Height:        Intake/Output Summary (Last 24 hours) at 03/09/2024 0730 Last data filed at 03/09/2024 0438 Gross per 24 hour  Intake 718.16 ml  Output 650 ml  Net 68.16 ml      03/04/2024    3:20 PM 01/20/2024    2:55 PM 01/08/2024    1:32 PM  Last 3 Weights  Weight (lbs) 200 lb 9.9 oz -- --  Weight (kg) 91 kg -- --      Telemetry/ECG  Normal sinus rhythm with heart rates in the 70s to 90s.  This morning at about 7:30 AM had a brief run of 2 to 1 AV block that only lasted 2 beats.- Personally Reviewed  Physical Exam  GEN: No acute distress.   Neck: No JVD Cardiac: RRR, no murmurs, rubs, or gallops.  Respiratory: Clear to auscultation bilaterally. GI: Soft, nontender, non-distended  MS: Did not examine feet as last time patient had spasms and there is no indication for volume overload  Assessment & Plan   Pericardial effusion Chest pain - pericarditis Chest pain was worse with coughing but not positional. Patient had elevated CRP of 20.8 and ESR of 49. Respiratory panel negative High-sensitivity troponins negative.  BNP negative. Pericardial effusion is suspected be secondary to pericarditis. Patient's blood pressure has been hypotensive at times.  This is because the patient was started on metoprolol . Because of this the patient was started on ibuprofen  and colchicine  Continue colchicine  0.6 mg twice daily. (Plan  to be on for 3 months). Continue ibuprofen  800 mg three times daily. (Plan to be on for 30 days). Outpatient echo in about 2-4 weeks.     New onset A-fib with RVR Hypotension 2:1 AV block In the setting of suspected pericarditis and CAP. CHA2DS2-VASc Score = 1 [CHF History: 0, HTN History: 0, Diabetes History: 0, Stroke History: 0, Vascular Disease History: 1, Age Score: 0, Gender Score: 0].  Therefore, the patient's annual risk of stroke is 0.6 %.  Score is 2 if you count CHF. Over the weekend the patient converted back into sinus rhythm. Continue oral amiodarone  200mg  twice daily. (Plan to be on twice daily for 10 days and decrease to once daily after that) Stop metoprolol  to 12.5 mg every 6 hours.  As patient became hypotensive overnight with a blood pressure as low as 82/56 and patient had a brief run of twos to 1 AV block this morning. No need for anticoagulation given low CHADS2VASC Score.    Cardiomyopathy Echo on 03/06/24 showed a reduced LVEF of 35 to 40%.  This declined since patient went into atrial fibrillation.  Suspect that atrial fibrillation may be the cause.  Will prioritize rate control and plan to recheck LVEF in about 3 months. GDMT is limited by hypotension.  Is a  poor candidate for SGLT2 with quadriplegia.    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   [facility] will sign off.   The patient is ready for discharge today from a cardiac standpoint. Medication Recommendations:  as above Other recommendations (labs, testing, etc):  TSH, LFT, and PFT with starting amiodarone  this hospitalization. Outpatient echo in about 2-4 weeks.  Follow up as an outpatient:  Follow up with Dr Michele on 03/23/24 at  8am.  For questions or updates, please contact [facility] Please consult [email or web address] for contact info under    Signed, Zane Adams, PA-C    I have personally evaluated and examined the patient. The history, physical exam, and medical decision making documented below were performed independently and substantively by me. I have reviewed all relevant data, formulated the assessment and plan, and assumed responsibility for the management of this patient. My documentation reflects the substantive portion of the split/shared visit, in accordance with CMS and CPT guidelines. I have updated the NPP documentation above as appropriate.  I have personally performed the substantive portion of the medical decision making, including interpretation of diagnostic data, formulation of the management plan, and assessment of risks. In summation:  [name] with pericarditis and atrial fibrillation, presents with improvement in his spastic paralysis and accompanied by family.   He has been experiencing chest discomfort and episodes of arrhythmia, described as 'funny heart rhythms.' A prior work-up revealed inflammation around the heart and changes in heart function during episodes of arrhythmia.  A prior work-up revealed fluid around the heart and aortic atherosclerosis. He has been on medication to manage the inflammation and is awaiting further imaging to assess heart function and fluid status. He is not currently on blood thinners and we have reviewed his CHADSVASC risk today  Exam notable for  Gen: no distress   Neck: No JVD Cardiac: Notable rub Respiratory: Clear to auscultation bilaterally, normal effort, normal  respiratory rate GI: Soft, nontender, non-distended  MS: No  edema; pain with even light palpitation of LE Neuro:  At time of evaluation, alert and oriented to person/place/time/situation   Personally reviewed data and interpretation:  Tele: SR with asymptomatic rare 2:1 AV  block  In assessment and plan:   Pericarditis with pericardial effusion The pericardial effusion likely triggered pericarditis, causing chest pain and abnormal heart rhythms. Normal heart function when not in arrhythmia suggests a link between the effusion and heart function. - Repeat echocardiogram in 2-5 weeks to assess heart function and fluid status (scheduled) - Monitor for improvement in chest discomfort and heart rhythm stability - if cardiomyopathy persistes will need GDMT titration and AC - continue NSAIDS and Colchicine   Paroxysmal atrial fibrillation Currently in normal rhythm with no recent episodes. Suspicion this was all mediated by his pericarditis. - as outpatient given AF cardiomyopathy (when in RVR only) and aortic atherosclerosis, may need AC; continue amiodarone   Aortic atherosclerosis Incidental finding with no significant risk factors identified. Current approach is to manage inflammation and monitor for changes. - outpatient LDL < 70  Reviewed with patient and family who have no questions or concerns, they are amenable to the no [facility] plan in the setting of isolated AF episode.  They are eager for DC  Stanly Leavens, MD FASE FACC  Cardiologist [facility]  [address] [place], [address] [place], [place] [address] [phone]  12:16 PM

## 2024-03-09 NOTE — TOC Transition Note (Signed)
 Transition of Care [facility]) - Discharge Note   Patient Details  Name: Leon Rodriguez MRN: [id number] Date of Birth: 1965/09/30  Transition of [facility] CM/SW Contact:  Sudie Erminio Deems, RN Phone Number: 03/09/2024, 3:11 PM   Clinical Narrative:  Patient will transition home today via [facility]. Inpatient Case Manager did call [facility] and ETA is within the next 3-4 hours. Staff RN is aware. No further home needs identified at this time.   Final next level of care: Home/Self Care Barriers to Discharge: No Barriers Identified  Discharge Plan and Services Additional resources added to the After Visit Summary for   In-house Referral: NA Discharge Planning Services: CM Consult Post Acute Care Choice: NA            DME Agency: NA       HH Arranged: NA  Social Drivers of Health (SDOH) Interventions SDOH Screenings   Food Insecurity: No Food Insecurity (03/05/2024)  Housing: Low Risk  (03/05/2024)  Transportation Needs: No Transportation Needs (03/05/2024)  Utilities: Not At Risk (03/05/2024)  Depression (PHQ2-9): Low Risk  (02/19/2024)  Recent Concern: Depression (PHQ2-9) - Medium Risk (01/20/2024)  Social Connections: Moderately Isolated (03/05/2024)  Tobacco Use: High Risk (03/05/2024)   Readmission Risk Interventions    03/06/2024    4:14 PM 07/17/2023   11:19 AM 06/07/2023   12:29 PM  Readmission Risk Prevention Plan  Medication Screening   Complete  Transportation Screening Complete Complete Complete  HRI or Home Care Consult Complete Complete   Social Work Consult for Recovery Care Planning/Counseling Complete Complete   Palliative Care Screening Complete Not Applicable   Medication Review Oceanographer) Referral to Pharmacy Complete

## 2024-03-09 NOTE — Plan of Care (Signed)
 Patient is borderline hypotensive blood pressure 85/60 MAP 65.  Given 500 mL of LR bolus and albumin  25 g.  Patient has low EF 35 to 45% and being admitted for pericardial effusion and pericarditis.

## 2024-03-12 ENCOUNTER — Ambulatory Visit: Payer: Self-pay

## 2024-03-16 ENCOUNTER — Other Ambulatory Visit: Payer: Self-pay

## 2024-03-16 ENCOUNTER — Other Ambulatory Visit (HOSPITAL_COMMUNITY): Payer: Self-pay

## 2024-03-18 ENCOUNTER — Other Ambulatory Visit (HOSPITAL_COMMUNITY): Payer: Self-pay

## 2024-03-18 ENCOUNTER — Encounter: Attending: Physical Medicine and Rehabilitation | Admitting: Physical Medicine and Rehabilitation

## 2024-03-18 ENCOUNTER — Encounter: Payer: Self-pay | Admitting: Physical Medicine and Rehabilitation

## 2024-03-18 VITALS — BP 97/65 | HR 94

## 2024-03-18 DIAGNOSIS — G8250 Quadriplegia, unspecified: Secondary | ICD-10-CM | POA: Diagnosis not present

## 2024-03-18 DIAGNOSIS — Z5181 Encounter for therapeutic drug level monitoring: Secondary | ICD-10-CM | POA: Diagnosis not present

## 2024-03-18 DIAGNOSIS — R252 Cramp and spasm: Secondary | ICD-10-CM | POA: Diagnosis present

## 2024-03-18 DIAGNOSIS — G8929 Other chronic pain: Secondary | ICD-10-CM | POA: Diagnosis present

## 2024-03-18 DIAGNOSIS — Z79899 Other long term (current) drug therapy: Secondary | ICD-10-CM | POA: Diagnosis not present

## 2024-03-18 MED ORDER — MORPHINE SULFATE 15 MG PO TABS
15.0000 mg | ORAL_TABLET | ORAL | 0 refills | Status: AC | PRN
  Filled 2024-03-18: qty 90, 15d supply, fill #0
  Filled 2024-06-02: qty 30, 5d supply, fill #0

## 2024-03-18 MED ORDER — DANTROLENE SODIUM 100 MG PO CAPS
100.0000 mg | ORAL_CAPSULE | Freq: Three times a day (TID) | ORAL | 5 refills | Status: AC
  Filled 2024-03-18 – 2024-04-15 (×2): qty 90, 30d supply, fill #0
  Filled 2024-04-22 – 2024-05-19 (×2): qty 90, 30d supply, fill #1
  Filled 2024-06-17: qty 90, 30d supply, fill #2
  Filled 2024-07-17: qty 90, 30d supply, fill #3

## 2024-03-18 MED ORDER — QUETIAPINE FUMARATE 50 MG PO TABS
50.0000 mg | ORAL_TABLET | Freq: Every day | ORAL | 5 refills | Status: AC
  Filled 2024-03-18: qty 30, 30d supply, fill #0
  Filled 2024-04-15: qty 30, 30d supply, fill #1
  Filled 2024-05-19: qty 30, 30d supply, fill #2
  Filled 2024-06-17: qty 30, 30d supply, fill #3
  Filled 2024-07-14: qty 30, 30d supply, fill #4

## 2024-03-18 MED ORDER — TIZANIDINE HCL 2 MG PO TABS
2.0000 mg | ORAL_TABLET | Freq: Three times a day (TID) | ORAL | 2 refills | Status: DC
  Filled 2024-03-18 – 2024-03-27 (×2): qty 90, 30d supply, fill #0
  Filled 2024-04-22: qty 90, 30d supply, fill #1
  Filled 2024-05-25: qty 90, 30d supply, fill #2

## 2024-03-18 MED ORDER — DIAZEPAM 10 MG PO TABS
5.0000 mg | ORAL_TABLET | Freq: Four times a day (QID) | ORAL | 5 refills | Status: AC | PRN
  Filled 2024-03-18: qty 120, 30d supply, fill #0
  Filled 2024-06-02: qty 120, 30d supply, fill #1

## 2024-03-18 MED ORDER — MORPHINE SULFATE 15 MG PO TABS
15.0000 mg | ORAL_TABLET | Freq: Four times a day (QID) | ORAL | 0 refills | Status: AC | PRN
  Filled 2024-03-18: qty 90, 23d supply, fill #0

## 2024-03-18 MED ORDER — GUAIFENESIN ER 600 MG PO TB12
600.0000 mg | ORAL_TABLET | Freq: Two times a day (BID) | ORAL | 1 refills | Status: DC
  Filled 2024-03-18: qty 40, 20d supply, fill #0
  Filled 2024-04-06: qty 16, 8d supply, fill #1
  Filled 2024-04-07: qty 40, 20d supply, fill #1

## 2024-03-18 MED ORDER — ALBUTEROL SULFATE HFA 108 (90 BASE) MCG/ACT IN AERS
1.0000 | INHALATION_SPRAY | RESPIRATORY_TRACT | 2 refills | Status: AC | PRN
  Filled 2024-03-18: qty 18, 17d supply, fill #0
  Filled 2024-06-02: qty 6.7, 17d supply, fill #1

## 2024-03-18 NOTE — Patient Instructions (Signed)
 Pt is a 58 yr old male with hx of severe cervical myelopathy s/p ACDF and PCDF C6-T1 by Dr Carollee- in January 2025 with severe uncontrolled spasticity as well as neurogenic bowel and bladder- recent pneumonia and moderate to severe pericardial effusion due to presumed pericarditis 02/2024   Here for  f/u from CIR for SCI  Due to pneumonia diagnosis, Will give Rx for Mucinex  600 mg 2x/day for 2 weeks- - I put a refill on it if he needs it  - drink a lot of water with mucinex   2.  Also for pneumonia coughing and phlegm- Will also give him an Albuterol  inhaler- 1-2 puffs every 4 hours as needed- to open up his lungs- if coughing a lot or wheezing  3. Will refill MSIR/morphine   15 mg up to 3x/day- sounds like hasn't taken as much as needs, because scared of running out.  -goal to put back to MS Contin  at next visit .  4. Not taking Oxyocdone and has a lot left, so won't refill right now.   5. Will refill Dantrolene - 100 mg 3x/day- Last ALT 44 (40 is normal) so will con't Dantrolene . Last got labs checked 03/05/24- so can wait to check labs today.    6. Valium  5-10 mg 4x/day as needed- #120- will send in refills.   7. Cymbalta  60 mg daily- last got filled by PCP 02/27/24  8. Will refill Seroquel  50 mg at bedtime for sleep and agitation- will con't regimen  9.Con't Zanaflex /tizanidine  2 mg 3x/day- for spasticity- cannot increase because  of low BP.   10. 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 Will restart after spasticity getting somewhat better  11.  On the days we go up on the ITB pump, would need to take an extra Senna-  needs to get pump that he can give self medicine when get to higher dose- push the patient issues button to do so.   Will start at 100 mcg/day- if Dr Vona would allow it, I would love 125 mcg/day. Intrathecal  baclofen  causes more constipation so will have to  increase bowel meds aggressively.  Once pump dose stablizes, will see every 4-6 week to refill the pump.   12. F/U in 3 months - will do double appt- SCI and ITB pump  13. Call me as soon as knows date for pump placement- and we will schedule appointments every 2 weeks or so to increase pump- we might be able to get have a company come to house to titrate up the pump. Remind to call about this!

## 2024-03-18 NOTE — Progress Notes (Signed)
 Subjective:    Patient ID: Leon Rodriguez, male    DOB: [birth year], 58 y.o.   MRN: [id number]  HPI  Pt is a 58 yr old male with hx of severe cervical myelopathy s/p ACDF and PCDF C6-T1 by Dr Carollee- in January 2025 with severe uncontrolled spasticity as well as neurogenic bowel and bladder   Here for  f/u from CIR for SCI   Saw Dr Vona- already saw NP- but seeing him 03/26/24 He told me thinks can get ITB pump.    So sleepy- having pain Was admitted to [facility] hospital for  5 days last week.   Thought had UTI, but had Fluid in heart/lungs.  Went to [facility].  Moderate to large pericardial effusion-  Thought to be due to pericarditis - didn't do a tap of fluid.  BP too low and heart rate too high to do pericardial tap-  Was on Heparin  gtt and amiodarone  gtt- but sent home on no AC b/c CHA2DS2- VASC score was 0. .  Also had possible pneumonia-given IV ABX for 5 days-    Still coughing really bad- really thick- no fever-   Taking the MSIR 15 mg at bedtime =takes regularly. Couldn't get ahold of long acting MS contin  Not taking the Oxy very often- last dose 4 days ago Takes Diazepam  4x/day. 10 mg now- started with 5 mg- but it's working, but is making him really sleepy.  Is finally helping spasms more than other meds .   No skin breakdown right now    Pain Inventory Average Pain 8 Pain Right Now 10 My pain is constant, sharp, burning, stabbing, and aching  In the last 24 hours, has pain interfered with the following? General activity 10 Relation with others 9 Enjoyment of life 10 What TIME of day is your pain at its worst? morning , daytime, evening, and night Sleep (in general) Poor  Pain is worse with: walking, sitting, and some activites Pain improves with: rest, heat/ice, and therapy/exercise Relief from Meds: 3  Family History  Family history unknown: Yes   Social History   Socioeconomic History   Marital status: Married    Spouse name: Not on file   Number  of children: Not on file   Years of education: Not on file   Highest education level: Not on file  Occupational History   Not on file  Tobacco Use   Smoking status: Every Day    Types: Cigars   Smokeless tobacco: Never  Vaping Use   Vaping status: Never Used  Substance and Sexual Activity   Alcohol use: Not Currently   Drug use: Yes    Types: Marijuana   Sexual activity: Not on file  Other Topics Concern   Not on file  Social History Narrative   Not on file   Social Drivers of Health   Financial Resource Strain: Not on file  Food Insecurity: No Food Insecurity (03/05/2024)   Hunger Vital Sign    Worried About Running Out of Food in the Last Year: Never true    Ran Out of Food in the Last Year: Never true  Transportation Needs: No Transportation Needs (03/05/2024)   PRAPARE - Administrator, Civil Service (Medical): No    Lack of Transportation (Non-Medical): No  Physical Activity: Not on file  Stress: Not on file  Social Connections: Moderately Isolated (03/05/2024)   Social Connection and Isolation Panel    Frequency of Communication with Friends and Family:  Twice a week    Frequency of Social Gatherings with Friends and Family: Twice a week    Attends Religious Services: Never    Database administrator or Organizations: No    Attends Engineer, structural: Never    Marital Status: Living with partner   Past Surgical History:  Procedure Laterality Date   ANTERIOR CERVICAL DECOMP/DISCECTOMY FUSION N/A 06/05/2023   Procedure: ANTERIOR CERVICAL DISCECTOMY AND FUSION CERVICAL SIX-SEVEN/CERVICAL SEVEN-THORACIC ONE;  Surgeon: Dawley, Lani BROCKS, DO;  Location: [facility] OR;  Service: Neurosurgery;  Laterality: N/A;  3C   APPENDECTOMY     CIRCUMCISION     HIATAL HERNIA REPAIR     HIP SURGERY     at 31 and 58 yo   POSTERIOR CERVICAL FUSION/FORAMINOTOMY N/A 07/12/2023   Procedure: POSTERIOR CERVICAL LAMINECTOMY, INSTRUMENTATION AND FUSION CERVICAL SIX-CERVICAL SEVEN,  CERVICAL SEVEN-THORACIC ONE;  Surgeon: Dawley, Lani BROCKS, DO;  Location: [facility] OR;  Service: Neurosurgery;  Laterality: N/A;   Past Surgical History:  Procedure Laterality Date   ANTERIOR CERVICAL DECOMP/DISCECTOMY FUSION N/A 06/05/2023   Procedure: ANTERIOR CERVICAL DISCECTOMY AND FUSION CERVICAL SIX-SEVEN/CERVICAL SEVEN-THORACIC ONE;  Surgeon: Carollee Lani BROCKS, DO;  Location: [facility] OR;  Service: Neurosurgery;  Laterality: N/A;  3C   APPENDECTOMY     CIRCUMCISION     HIATAL HERNIA REPAIR     HIP SURGERY     at 17 and 58 yo   POSTERIOR CERVICAL FUSION/FORAMINOTOMY N/A 07/12/2023   Procedure: POSTERIOR CERVICAL LAMINECTOMY, INSTRUMENTATION AND FUSION CERVICAL SIX-CERVICAL SEVEN, CERVICAL SEVEN-THORACIC ONE;  Surgeon: Dawley, Lani BROCKS, DO;  Location: [facility] OR;  Service: Neurosurgery;  Laterality: N/A;   Past Medical History:  Diagnosis Date   Arthritis    Hip pain    Renal calculi    left   Sepsis (HCC) 06/13/2023   BP 97/65   Pulse 94   SpO2 96%   Opioid Risk Score:   Fall Risk Score:  `1  Depression screen [facility] 2/9     03/18/2024   11:40 AM 02/19/2024    1:23 PM 01/20/2024    3:03 PM 12/04/2023   10:02 AM 12/04/2023   10:00 AM 11/20/2023    8:56 AM 10/09/2023   10:28 AM  Depression screen PHQ 2/9  Decreased Interest 1 0 3 3 0 2 0  Down, Depressed, Hopeless 1 0 3 3 0 2 0  PHQ - 2 Score 2 0 6 6 0 4 0  Altered sleeping  0  1     Tired, decreased energy  0  3     Change in appetite  0  0     Feeling bad or failure about yourself   0  0     Trouble concentrating  0  0     Moving slowly or fidgety/restless  0  0     Suicidal thoughts  0  0     PHQ-9 Score  0  10     Difficult doing work/chores  Not difficult at all  Somewhat difficult       Review of Systems  Musculoskeletal:        Pain in both shoulders, both legs from hips to feet, & hands  All other systems reviewed and are negative.      Objective:   Physical Exam  Very sedated- wakes intermittently to speak and is coherent,  moaning even in sleep; Accompanied by life partner, in power w/c; NAD       Assessment &  Plan:   Pt is a 58 yr old male with hx of severe cervical myelopathy s/p ACDF and PCDF C6-T1 by Dr Carollee- in January 2025 with severe uncontrolled spasticity as well as neurogenic bowel and bladder- recent pneumonia and moderate to severe pericardial effusion due to presumed pericarditis 02/2024   Here for  f/u from CIR for SCI  Due to pneumonia diagnosis, Will give Rx for Mucinex  600 mg 2x/day for 2 weeks- - I put a refill on it if he needs it  - drink a lot of water with mucinex   2.  Also for pneumonia coughing and phlegm- Will also give him an Albuterol  inhaler- 1-2 puffs every 4 hours as needed- to open up his lungs- if coughing a lot or wheezing  3. Will refill MSIR/morphine   15 mg up to 3x/day- sounds like hasn't taken as much as needs, because scared of running out.  -goal to put back to MS Contin  at next visit .  4. Not taking Oxyocdone and has a lot left, so won't refill right now.   5. Will refill Dantrolene - 100 mg 3x/day- Last ALT 44 (40 is normal) so will con't Dantrolene . Last got labs checked 03/05/24- so can wait to check labs today.    6. Valium  5-10 mg 4x/day as needed- #120- will send in refills.   7. Cymbalta  60 mg daily- last got filled by PCP 02/27/24  8. Will refill Seroquel  50 mg at bedtime for sleep and agitation- will con't regimen  9.Con't Zanaflex /tizanidine  2 mg 3x/day- for spasticity- cannot increase because  of low BP.   10. 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 Will restart after spasticity getting somewhat better  11.  On the days we go up on the ITB pump, would need to take an extra Senna-  needs to get pump that he can give self medicine when get to higher dose- push the patient issues button to do so.    Will start at 100 mcg/day- if Dr Vona would allow it, I would love 125 mcg/day. Intrathecal baclofen  causes more constipation so will have to  increase bowel meds aggressively.  Once pump dose stablizes, will see every 4-6 week to refill the pump.   12. F/U in 3 months - will do double appt- SCI and ITB pump  13. Call me as soon as knows date for pump placement- and we will schedule appointments every 2 weeks or so to increase pump- we might be able to get have a company come to house to titrate up the pump. Remind to call about this!     I spent a total of 43   minutes on total care today- >50% coordination of care- due to  d/w pt and wife about spasticity, ITB pump- refills- and plan for pump/education given.

## 2024-03-23 ENCOUNTER — Ambulatory Visit: Attending: Cardiology | Admitting: Cardiology

## 2024-03-23 ENCOUNTER — Encounter: Payer: Self-pay | Admitting: Cardiology

## 2024-03-23 ENCOUNTER — Ambulatory Visit

## 2024-03-23 VITALS — BP 100/58 | HR 104 | Resp 16 | Ht 69.0 in | Wt 200.0 lb

## 2024-03-23 DIAGNOSIS — I700 Atherosclerosis of aorta: Secondary | ICD-10-CM | POA: Diagnosis present

## 2024-03-23 DIAGNOSIS — I480 Paroxysmal atrial fibrillation: Secondary | ICD-10-CM

## 2024-03-23 DIAGNOSIS — G8250 Quadriplegia, unspecified: Secondary | ICD-10-CM | POA: Diagnosis present

## 2024-03-23 DIAGNOSIS — I3139 Other pericardial effusion (noninflammatory): Secondary | ICD-10-CM | POA: Diagnosis not present

## 2024-03-23 DIAGNOSIS — G959 Disease of spinal cord, unspecified: Secondary | ICD-10-CM | POA: Diagnosis present

## 2024-03-23 DIAGNOSIS — I300 Acute nonspecific idiopathic pericarditis: Secondary | ICD-10-CM | POA: Insufficient documentation

## 2024-03-23 NOTE — Progress Notes (Unsigned)
 Enrolled patient for a 14 day Zio XT  monitor to be mailed to patients home

## 2024-03-23 NOTE — Progress Notes (Signed)
 Cardiology Office Note:  .   Date:  03/23/2024  ID:  Leon Rodriguez, DOB [birth year], MRN [id number] PCP:  Marvene Prentice SAUNDERS, FNP  Former Cardiology Providers: None [facility] Providers Cardiologist:  Madonna Large, DO , [facility] (established care 03/04/2024) Electrophysiologist:  None  Click to update primary MD,subspecialty MD or APP then REFRESH:1}    Chief Complaint  Patient presents with   Pericardial Effusion   Hospitalization Follow-up    History of Present Illness: .   Leon Rodriguez is a 58 y.o. African-American male whose past medical history and cardiovascular risk factors includes: Pericardial effusion, alone atrial fibrillation, Aortic atherosclerosis, hx of cervical myelopathy with spasticity, neurogenic bowel and bladder, arthritis, renal calculi, tobacco/THC use, hx sepsis.  Patient was seen during his recent hospitalization September 2025 for pericardial effusion evaluation.  It was felt that the pericardial effusion along with precordial discomfort were consistent with an episode of pericarditis.  Inflammatory markers are elevated.  Patient was started on NSAIDs and colchicine .  During the hospitalization patient went into atrial fibrillation and was initially treated with rate control strategy with metoprolol .  However with metoprolol  patient's blood pressure dropped and therefore was transition to IV amiodarone .  Patient was placed on oral amiodarone  at the time of discharge.  Given his CHA2DS2-VASc or anticoagulation was not initiated.  His echocardiogram in September 2025 noted moderate pericardial effusion with no tamponade physiology.  LVEF was reported to be 35-40% and estimated RAP was 15 mmHg.  At the time of discharge the tentative plan was to continue colchicine  and high-dose NSAIDs for the treatment of pericarditis, repeat echocardiogram as outpatient to reevaluate LVEF, if LVEF remains reduced may consider oral anticoagulation after shared decision.    Patient presents today for follow-up.  Patient obtained by his girlfriend Dolores at today's office visit.  Patient provides verbal consent with having her present during today's encounter.  Hospital discharge patient still has pleuritic chest pain but overall intensity frequency and duration has improved.  Unfortunately, about a week ago he stopped taking NSAIDs due to symptoms of feeling sweaty.  He is currently on colchicine  and doing well.  Overall functional capacity is limited given his history of quadriplegia.  Review of Systems: .   Review of Systems  Cardiovascular:  Positive for chest pain (Pleuritic). Negative for claudication, irregular heartbeat, leg swelling, near-syncope, orthopnea, palpitations, paroxysmal nocturnal dyspnea and syncope.  Respiratory:  Negative for shortness of breath.   Hematologic/Lymphatic: Negative for bleeding problem.    Studies Reviewed:   EKG: EKG Interpretation Date/Time:  Monday March 23 2024 08:10:15 EDT Ventricular Rate:  104 PR Interval:  146 QRS Duration:  88 QT Interval:  320 QTC Calculation: 420 R Axis:   144  Text Interpretation: Sinus tachycardia Right axis deviation Possible Right ventricular hypertrophy Nonspecific T wave abnormality When compared with ECG of 05-Mar-2024 19:42, Sinus rhythm has replaced Atrial fibrillation QRS axis Shifted right Nonspecific T wave abnormality now evident in Anterior leads Confirmed by Large Madonna [phone]) on 03/23/2024 8:11:34 AM  Echocardiogram: 03/06/2024  1. Moderate pericardial effusion. IVC is fixed and dilated, and unable to  evaluate mitral inflow for respiratory variation due to Afib, but no RA/RV  collapse seen to suggest tamponade   2. Left ventricular ejection fraction, by estimation, is 35 to 40%. The  left ventricle has moderately decreased function. Compared to echo  03/06/24, patient is now in Afib with RVR and systolic function has  worsened   3. Right ventricular systolic  function is moderately reduced. The right  ventricular size is normal.   4. The inferior vena cava is dilated in size with <50% respiratory  variability, suggesting right atrial pressure of 15 mmHg.   RADIOLOGY: NA  Risk Assessment/Calculations:   NA   Labs:       Latest Ref Rng & Units 03/09/2024    5:19 AM 03/08/2024    4:23 AM 03/07/2024    5:05 AM  CBC  WBC 4.0 - 10.5 K/uL 8.9  10.2  9.6   Hemoglobin 13.0 - 17.0 g/dL 89.4  89.0  88.6   Hematocrit 39.0 - 52.0 % 30.1  31.0  31.8   Platelets 150 - 400 K/uL 354  321  290        Latest Ref Rng & Units 03/09/2024    5:19 AM 03/08/2024    4:23 AM 03/07/2024    5:05 AM  BMP  Glucose 70 - 99 mg/dL 95  98  98   BUN 6 - 20 mg/dL 7  7  11    Creatinine 0.61 - 1.24 mg/dL 9.48  9.50  9.46   Sodium 135 - 145 mmol/L 139  138  136   Potassium 3.5 - 5.1 mmol/L 3.3  3.6  3.6   Chloride 98 - 111 mmol/L 103  105  102   CO2 22 - 32 mmol/L 25  24  22    Calcium 8.9 - 10.3 mg/dL 8.7  8.7  8.6       Latest Ref Rng & Units 03/09/2024    5:19 AM 03/08/2024    4:23 AM 03/07/2024    5:05 AM  CMP  Glucose 70 - 99 mg/dL 95  98  98   BUN 6 - 20 mg/dL 7  7  11    Creatinine 0.61 - 1.24 mg/dL 9.48  9.50  9.46   Sodium 135 - 145 mmol/L 139  138  136   Potassium 3.5 - 5.1 mmol/L 3.3  3.6  3.6   Chloride 98 - 111 mmol/L 103  105  102   CO2 22 - 32 mmol/L 25  24  22    Calcium 8.9 - 10.3 mg/dL 8.7  8.7  8.6     No results found for: CHOL, HDL, LDLCALC, LDLDIRECT, TRIG, CHOLHDL No results for input(s): LIPOA in the last 8760 hours. No components found for: NTPROBNP No results for input(s): PROBNP in the last 8760 hours. Recent Labs    03/04/24 2042  TSH 1.372    Physical Exam:    Today's Vitals   03/23/24 0801  BP: (!) 100/58  Pulse: (!) 104  Resp: 16  SpO2: 94%  Weight: 200 lb (90.7 kg)  Height: 5' 9 (1.753 m)   Body mass index is 29.53 kg/m. Wt Readings from Last 3 Encounters:  03/23/24 200 lb (90.7 kg)   03/04/24 200 lb 9.9 oz (91 kg)  12/04/23 200 lb (90.7 kg)    Physical Exam  Constitutional: No distress. He appears chronically ill.  hemodynamically stable, presents in a motorized chair  Neck: No JVD present.  Cardiovascular: Regular rhythm, S1 normal and S2 normal. Tachycardia present. Exam reveals no gallop, no S3 and no S4.  No murmur heard. Pulmonary/Chest: Effort normal and breath sounds normal. No stridor. He has no wheezes. He has no rales.  Musculoskeletal:        General: No edema.     Cervical back: Neck supple.  Skin: Skin is warm.     Impression &  Recommendation(s):  Impression:   ICD-10-CM   1. Pericardial effusion  I31.39 EKG 12-Lead    2. Idiopathic pericarditis, unspecified chronicity  I30.0     3. Paroxysmal atrial fibrillation (HCC)  I48.0 LONG TERM MONITOR (3-14 DAYS)    Comprehensive metabolic panel with GFR    TSH    TSH    Comprehensive metabolic panel with GFR    CANCELED: Hemoglobin and hematocrit, blood    CANCELED: Basic metabolic panel with GFR    CANCELED: Basic metabolic panel with GFR    CANCELED: Hemoglobin and hematocrit, blood    4. Aortic atherosclerosis  I70.0     5. Quadriplegia (HCC)  G82.50     6. Cervical myelopathy (HCC)  G95.9        Recommendation(s):  Pericardial effusion Idiopathic pericarditis, unspecified chronicity Chest pain consistent with pericarditis Remains on colchicine , tentatively until 06/08/2024 Reemphasize importance of NSAIDs. Resume ibuprofen  800 mg p.o. 3 times daily for 25 days ~as originally planned. Will repeat echocardiogram to reevaluate LVEF, pericardial effusion, tamponade physiology Respiratory panel negative. High sensitive troponins and BNP negative during last hospitalization  Paroxysmal atrial fibrillation (HCC) Newly discovered during a hospitalization in September 2025 in the setting of acute pericarditis Converted to sinus rhythm after starting amiodarone  Was started on metoprolol   for rate control strategy during the hospitalization.  But discontinued due to bradycardia and hypotension.  He had a brief episode of left 2-1 AV block. Shared decision was to hold off long-term oral anticoagulation for now.   Plan echocardiogram as outpatient and if LVEF remains reduced then uptitration of GDMT and anticoagulation should be considered If he remains on amiodarone  long-term will order PFTs at the next office visit. Check H&H, TSH, CMP just prior to the next office visit  Aortic atherosclerosis Noted on CT abdomen pelvis 03/04/2024 Discussed lipid-lowering agents at the next office visit   Orders Placed:  Orders Placed This Encounter  Procedures   Comprehensive metabolic panel with GFR    Standing Status:   Future    Number of Occurrences:   1    Expiration Date:   03/23/2025   TSH    Standing Status:   Future    Number of Occurrences:   1    Expiration Date:   03/23/2025   LONG TERM MONITOR (3-14 DAYS)    Standing Status:   Future    Number of Occurrences:   1    Expiration Date:   03/23/2025    Where should this test be performed?:   [facility]    Does the patient have an implanted cardiac device?:   No    Prescribed days of wear:   14    Type of enrollment:   Home Enrollment    Vendor::   [employer]    Reason for Exam:   A-fib, Unspecified I48.91   EKG 12-Lead     Final Medication List:   No orders of the defined types were placed in this encounter.   There are no discontinued medications.   Current Outpatient Medications:    acetaminophen  (TYLENOL ) 325 MG tablet, Take 2 tablets (650 mg total) by mouth every 4 (four) hours as needed for fever or mild pain (pain score 1-3)., Disp: 100 tablet, Rfl: 0   albuterol  (VENTOLIN  HFA) 108 (90 Base) MCG/ACT inhaler, Inhale 1-2 puffs into the lungs every 4 (four) hours as needed for wheezing or shortness of breath., Disp: 18 g, Rfl: 2   amiodarone  (PACERONE ) 200 MG tablet,  Take 1 tablet (200 mg total) by mouth 2 (two) times  daily for 8 days, THEN 1 tablet (200 mg total) daily., Disp: 46 tablet, Rfl: 0   ascorbic acid  (VITAMIN C ) 1000 MG tablet, Take 1 tablet (1,000 mg total) by mouth daily., Disp: 30 tablet, Rfl: 3   baclofen  (LIORESAL ) 20 MG tablet, Take 2 tablets (40 mg total) by mouth 4 (four) times daily. 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, Disp: 240 each, Rfl: 5   colchicine  0.6 MG tablet, Take 1 tablet (0.6 mg total) by mouth 2 (two) times daily., Disp: 60 tablet, Rfl: 0   dantrolene  (DANTRIUM ) 100 MG capsule, Take 1 capsule (100 mg total) by mouth 3 (three) times daily., Disp: 90 capsule, Rfl: 5   diazepam  (VALIUM ) 10 MG tablet, Take 0.5-1 tablets (5-10 mg total) by mouth every 6 (six) hours as needed., Disp: 120 tablet, Rfl: 5   docusate sodium  (ENEMEEZ) 283 MG enema, Place 1 enema (283 mg total) rectally daily as needed for severe constipation., Disp: 15 each, Rfl: 0   DULoxetine  (CYMBALTA ) 60 MG capsule, Take 1 capsule (60 mg total) by mouth daily., Disp: 90 capsule, Rfl: 1   guaiFENesin  (MUCINEX ) 600 MG 12 hr tablet, Take 1 tablet (600 mg total) by mouth 2 (two) times daily. For Symptoms from pneumonia, Disp: 28 tablet, Rfl: 1   ibuprofen  (ADVIL ) 800 MG tablet, Take 1 tablet (800 mg total) by mouth 3 (three) times daily for 25 days., Disp: 75 tablet, Rfl: 0   lidocaine  (LIDODERM ) 5 %, Place 2 patches onto the skin daily. For chronic back pain- and hip pain- to reduce amount of opiates giving pt-Remove & Discard patch within 12 hours or as directed by MD, Disp: 60 patch, Rfl: 5   lidocaine  (XYLOCAINE ) 2 % jelly, Apply topically as needed (Use with in and out catheter)., Disp: 30 mL, Rfl: 0   morphine  (MSIR) 15 MG tablet, Take 1 tablet (15 mg total) by mouth every 6 (six) hours as needed for severe pain (pain score 7-10) (max 3x/day). For chronic pain due to Spinal cord injury- G89.2, Disp: 90 tablet, Rfl: 0   morphine  (MSIR) 15 MG tablet, Take 1 tablet (15 mg total) by mouth every 4 (four) hours as needed for severe pain  (pain score 7-10). G89.2- chronic pain due to SCI- can refill 28 days after 9/25, Disp: 90 tablet, Rfl: 0   oxyCODONE  (ROXICODONE ) 15 MG immediate release tablet, Take 1 tablet (15 mg total) by mouth every 6 (six) hours as needed for severe pain (pain score 7-10)., Disp: 120 tablet, Rfl: 0   pantoprazole  (PROTONIX ) 40 MG tablet, Take 1 tablet (40 mg total) by mouth 2 (two) times daily., Disp: 60 tablet, Rfl: 0   polyethylene glycol powder (MIRALAX ) 17 GM/SCOOP powder, Mix 17 g in 4 oz of liquid & take by mouth daily., Disp: 510 g, Rfl: 5   QUEtiapine  (SEROQUEL ) 50 MG tablet, Take 1 tablet (50 mg total) by mouth at bedtime., Disp: 30 tablet, Rfl: 5   senna (SENOKOT) 8.6 MG tablet, Take 3 tablets by mouth daily., Disp: , Rfl:    tamsulosin  (FLOMAX ) 0.4 MG CAPS capsule, Take 1 capsule (0.4 mg total) by mouth every evening., Disp: 30 capsule, Rfl: 2   tiZANidine  (ZANAFLEX ) 2 MG tablet, Take 1 tablet (2 mg total) by mouth 3 (three) times daily., Disp: 90 tablet, Rfl: 2  Consent:   NA  Disposition:   November 2025, sooner if needed  His questions and concerns  were addressed to his satisfaction. He voices understanding of the recommendations provided during this encounter.    Signed, Madonna Michele HAS, [facility]  A Division of [facility] [address] [place], [place] [address]  03/23/2024 7:05 PM

## 2024-03-23 NOTE — Patient Instructions (Addendum)
 Medication Instructions:  Your physician recommends that you continue on your current medications as directed. Please refer to the Current Medication list given to you today.  *If you need a refill on your cardiac medications before your next appointment, please call your pharmacy*  Lab Work: Before Nov office visit with Dr. Michele: BMP, H&H  If you have labs (blood work) drawn today and your tests are completely normal, you will receive your results only by: MyChart Message (if you have MyChart) OR A paper copy in the mail If you have any lab test that is abnormal or we need to change your treatment, we will call you to review the results.  Testing/Procedures: BEGINNING of NOV - - -Your physician has requested that you have an echocardiogram. Echocardiography is a painless test that uses sound waves to create images of your heart. It provides your doctor with information about the size and shape of your heart and how well your heart's chambers and valves are working. This procedure takes approximately one hour. There are no restrictions for this procedure. Please do NOT wear cologne, perfume, aftershave, or lotions (deodorant is allowed). Please arrive 15 minutes prior to your appointment time.  Please note: We ask at that you not bring children with you during ultrasound (echo/ vascular) testing. Due to room size and safety concerns, children are not allowed in the ultrasound rooms during exams. Our front office staff cannot provide observation of children in our lobby area while testing is being conducted. An adult accompanying a patient to their appointment will only be allowed in the ultrasound room at the discretion of the ultrasound technician under special circumstances. We apologize for any inconvenience.   Follow-Up: At [facility], you and your health needs are our priority.  As part of our continuing mission to provide you with exceptional heart care, our providers are all  part of one team.  This team includes your primary Cardiologist (physician) and Advanced Practice Providers or APPs (Physician Assistants and Nurse Practitioners) who all work together to provide you with the care you need, when you need it.  Your next appointment:   NOV 2025  Provider:   Madonna Michele, DO     Other Instructions ZIO XT- Long Term Monitor Instructions  Your physician has requested you wear a ZIO patch monitor for 14 days.  This is a single patch monitor. [employer] supplies one patch monitor per enrollment. Additional stickers are not available. Please do not apply patch if you will be having a Nuclear Stress Test,   Cardiac CT, MRI, or Chest Xray during the period you would be wearing the  monitor. The patch cannot be worn during these tests. You cannot remove and re-apply the  ZIO XT patch monitor.  Your ZIO patch monitor will be mailed 3 day USPS to your address on file. It may take 3-5 days  to receive your monitor after you have been enrolled.  Once you have received your monitor, please review the enclosed instructions. Your monitor  has already been registered assigning a specific monitor serial # to you.  Billing and Patient Assistance Program Information  We have supplied [employer] with any of your insurance information on file for billing purposes. [employer] offers a sliding scale Patient Assistance Program for patients that do not have  insurance, or whose insurance does not completely cover the cost of the ZIO monitor.  You must apply for the Patient Assistance Program to qualify for this discounted rate.  To apply,  please call [employer] at [phone], select option 4, select option 2, ask to apply for  Patient Assistance Program. Meredeth will ask your household income, and how many people  are in your household. They will quote your out-of-pocket cost based on that information.  [employer] will also be able to set up a 31-month, interest-free payment plan if  needed.  Applying the monitor   Shave hair from upper left chest.  Hold abrader disc by orange tab. Rub abrader in 40 strokes over the upper left chest as  indicated in your monitor instructions.  Clean area with 4 enclosed alcohol pads. Let dry.  Apply patch as indicated in monitor instructions. Patch will be placed under collarbone on left  side of chest with arrow pointing upward.  Rub patch adhesive wings for 2 minutes. Remove white label marked 1. Remove the white  label marked 2. Rub patch adhesive wings for 2 additional minutes.  While looking in a mirror, press and release button in center of patch. A small green light will  flash 3-4 times. This will be your only indicator that the monitor has been turned on.  Do not shower for the first 24 hours. You may shower after the first 24 hours.  Press the button if you feel a symptom. You will hear a small click. Record Date, Time and  Symptom in the Patient Logbook.  When you are ready to remove the patch, follow instructions on the last 2 pages of Patient  Logbook. Stick patch monitor onto the last page of Patient Logbook.  Place Patient Logbook in the blue and white box. Use locking tab on box and tape box closed  securely. The blue and white box has prepaid postage on it. Please place it in the mailbox as  soon as possible. Your physician should have your test results approximately 7 days after the  monitor has been mailed back to [facility].  Call [facility] Customer Care at [phone] if you have questions regarding  your ZIO XT patch monitor. Call them immediately if you see an orange light blinking on your  monitor.  If your monitor falls off in less than 4 days, contact our Monitor department at [phone].  If your monitor becomes loose or falls off after 4 days call [employer] at [phone] for  suggestions on securing your monitor

## 2024-03-24 ENCOUNTER — Ambulatory Visit: Payer: Self-pay | Admitting: Physical Medicine and Rehabilitation

## 2024-03-24 LAB — DRUG TOX MONITOR 1 W/CONF, ORAL FLD
AMINOCLONAZEPAM: NEGATIVE ng/mL (ref <0.50)
Alprazolam: NEGATIVE ng/mL (ref <0.50)
Amphetamines: NEGATIVE ng/mL (ref <10)
Barbiturates: NEGATIVE ng/mL (ref <10)
Benzodiazepines: POSITIVE ng/mL — AB (ref <0.50)
Buprenorphine: NEGATIVE ng/mL (ref <0.10)
Buprenorphine: NEGATIVE ng/mL (ref <0.10)
Chlordiazepoxide: NEGATIVE ng/mL (ref <0.50)
Clonazepam: NEGATIVE ng/mL (ref <0.50)
Cocaine: NEGATIVE ng/mL (ref <5.0)
Codeine: NEGATIVE ng/mL (ref <2.5)
Cotinine: 44.3 ng/mL — ABNORMAL HIGH (ref <5.0)
Diazepam: 9.62 ng/mL — ABNORMAL HIGH (ref <0.50)
Dihydrocodeine: NEGATIVE ng/mL (ref <2.5)
Fentanyl: NEGATIVE ng/mL (ref <0.10)
Flunitrazepam: NEGATIVE ng/mL (ref <0.50)
Flurazepam: NEGATIVE ng/mL (ref <0.50)
Heroin Metabolite: NEGATIVE ng/mL (ref <1.0)
Hydrocodone: NEGATIVE ng/mL (ref <2.5)
Hydromorphone: NEGATIVE ng/mL (ref <2.5)
Lorazepam: NEGATIVE ng/mL (ref <0.50)
MARIJUANA: POSITIVE ng/mL — AB (ref <2.5)
MDMA: NEGATIVE ng/mL (ref <10)
Meprobamate: NEGATIVE ng/mL (ref <2.5)
Methadone: NEGATIVE ng/mL (ref <5.0)
Midazolam: NEGATIVE ng/mL (ref <0.50)
Morphine: 11.9 ng/mL — ABNORMAL HIGH (ref <2.5)
Naloxone: NEGATIVE ng/mL (ref <0.25)
Nicotine Metabolite: POSITIVE ng/mL — AB (ref <5.0)
Nordiazepam: 8.82 ng/mL — ABNORMAL HIGH (ref <0.50)
Norhydrocodone: NEGATIVE ng/mL (ref <2.5)
Noroxycodone: NEGATIVE ng/mL (ref <2.5)
Opiates: POSITIVE ng/mL — AB (ref <2.5)
Oxazepam: NEGATIVE ng/mL (ref <0.50)
Oxycodone: NEGATIVE ng/mL (ref <2.5)
Oxymorphone: NEGATIVE ng/mL (ref <2.5)
Phencyclidine: NEGATIVE ng/mL (ref <10)
THC: 13.4 ng/mL — ABNORMAL HIGH (ref <2.5)
Tapentadol: NEGATIVE ng/mL (ref <5.0)
Temazepam: NEGATIVE ng/mL (ref <0.50)
Tramadol: NEGATIVE ng/mL (ref <5.0)
Triazolam: NEGATIVE ng/mL (ref <0.50)
Zolpidem: NEGATIVE ng/mL (ref <5.0)

## 2024-03-24 LAB — DRUG TOX ALC METAB W/CON, ORAL FLD: Alcohol Metabolite: NEGATIVE ng/mL (ref <25)

## 2024-03-25 NOTE — Telephone Encounter (Signed)
 Warning Letter sent through [employer],

## 2024-03-27 ENCOUNTER — Other Ambulatory Visit (HOSPITAL_COMMUNITY): Payer: Self-pay

## 2024-03-27 MED ORDER — ONDANSETRON HCL 4 MG PO TABS
ORAL_TABLET | ORAL | 0 refills | Status: AC
  Filled 2024-03-27: qty 60, 10d supply, fill #0

## 2024-03-30 ENCOUNTER — Telehealth: Payer: Self-pay

## 2024-03-30 NOTE — Telephone Encounter (Signed)
 [name] states that patient did not see the doctor about the Baclofen  pump because transportation forgot to pick them up so next appt is February

## 2024-04-06 ENCOUNTER — Other Ambulatory Visit: Payer: Self-pay | Admitting: Physical Medicine and Rehabilitation

## 2024-04-06 ENCOUNTER — Other Ambulatory Visit (HOSPITAL_COMMUNITY): Payer: Self-pay

## 2024-04-07 ENCOUNTER — Other Ambulatory Visit: Payer: Self-pay | Admitting: Physical Medicine and Rehabilitation

## 2024-04-07 ENCOUNTER — Other Ambulatory Visit (HOSPITAL_COMMUNITY): Payer: Self-pay

## 2024-04-08 ENCOUNTER — Telehealth: Payer: Self-pay | Admitting: Cardiology

## 2024-04-08 ENCOUNTER — Other Ambulatory Visit (HOSPITAL_COMMUNITY): Payer: Self-pay

## 2024-04-08 MED ORDER — AMIODARONE HCL 200 MG PO TABS
ORAL_TABLET | ORAL | 3 refills | Status: DC
  Filled 2024-04-08: qty 90, 38d supply, fill #0

## 2024-04-08 MED ORDER — PANTOPRAZOLE SODIUM 40 MG PO TBEC
40.0000 mg | DELAYED_RELEASE_TABLET | Freq: Two times a day (BID) | ORAL | 3 refills | Status: AC
  Filled 2024-04-08: qty 180, 90d supply, fill #0

## 2024-04-08 MED ORDER — COLCHICINE 0.6 MG PO TABS
0.6000 mg | ORAL_TABLET | Freq: Two times a day (BID) | ORAL | 3 refills | Status: DC
  Filled 2024-04-08: qty 60, 30d supply, fill #0

## 2024-04-08 MED ORDER — TAMSULOSIN HCL 0.4 MG PO CAPS
0.4000 mg | ORAL_CAPSULE | Freq: Every evening | ORAL | 1 refills | Status: AC
  Filled 2024-04-08: qty 90, 90d supply, fill #0
  Filled 2024-07-07: qty 90, 90d supply, fill #1

## 2024-04-08 NOTE — Telephone Encounter (Signed)
 RX sent in

## 2024-04-08 NOTE — Telephone Encounter (Signed)
*  STAT* If patient is at the pharmacy, call can be transferred to refill team.   1. Which medications need to be refilled? (please list name of each medication and dose if known)  amiodarone  (PACERONE ) 200 MG tablet   colchicine  0.6 MG tablet   pantoprazole  (PROTONIX ) 40 MG tablet   2. Which pharmacy/location (including street and city if local pharmacy) is medication to be sent to? [place] [facility]   3. Do they need a 30 day or 90 day supply? 90

## 2024-04-09 ENCOUNTER — Other Ambulatory Visit: Payer: Self-pay

## 2024-04-09 ENCOUNTER — Other Ambulatory Visit (HOSPITAL_COMMUNITY): Payer: Self-pay

## 2024-04-09 MED ORDER — GUAIFENESIN ER 600 MG PO TB12
600.0000 mg | ORAL_TABLET | Freq: Two times a day (BID) | ORAL | 5 refills | Status: AC
  Filled 2024-04-09: qty 40, 20d supply, fill #0
  Filled 2024-05-04: qty 30, 15d supply, fill #1
  Filled 2024-05-19: qty 30, 15d supply, fill #2
  Filled 2024-06-02: qty 4, 2d supply, fill #3
  Filled 2024-06-02: qty 26, 13d supply, fill #3

## 2024-04-13 ENCOUNTER — Other Ambulatory Visit (HOSPITAL_COMMUNITY)

## 2024-04-15 ENCOUNTER — Other Ambulatory Visit (HOSPITAL_COMMUNITY): Payer: Self-pay

## 2024-04-15 ENCOUNTER — Other Ambulatory Visit: Payer: Self-pay

## 2024-04-15 ENCOUNTER — Ambulatory Visit: Payer: Self-pay

## 2024-04-15 DIAGNOSIS — R2689 Other abnormalities of gait and mobility: Secondary | ICD-10-CM

## 2024-04-15 DIAGNOSIS — R293 Abnormal posture: Secondary | ICD-10-CM

## 2024-04-15 DIAGNOSIS — M6281 Muscle weakness (generalized): Secondary | ICD-10-CM

## 2024-04-15 NOTE — Therapy (Unsigned)
 [facility] [address] [place], [place], [address] Phone: [phone]   Fax:  [phone]  Patient Details  Name: Leon Rodriguez MRN: [id number] Date of Birth: 1966-01-04 Referring Provider:  No ref. provider found  Encounter Date: 04/15/2024  This encounter is to serve as a LMN for wheelchair modifications to his current Group 3 powerwheelchair.   The patient is 58 yo male with a PMH significant for cervical myelopathy. He is TotalA for all transfers and functional mobility- relying on his group 3 custom power wheelchair to be able to participate in any and all of his MRADLs. 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 This was slightly improved by recent Botox  injections (L LE 3+, R LE 2+). It has become increasingly difficult for the patient and his caregiver to complete adequate perihygiene and lower body dressing. This significantly increases his risk for developing pressure injuries, which would have grave sequelae. He has developed a pressure injury in his L heel due to his inability to independently reposition his feet. Given the severity of his tone, his feet are also maintained at unusual angles, perpetuated by his adductor tone. Large ankle huggers with a mounting kit would assist with maintaining appropriate foot positioning and reduce his risk for worsening of his current pressure injuries, or development of new ones. A high profile single valve cushion will provide the patient with adequate stability, but also pressure relief while seated in his power wheelchair participating in his MRADLs. He is at a high risk for developing pressure injuries as he is unable to complete a full pressure relief unless he uses specific power functions of his wheelchair. Given limited upper extremity function, he sometimes needs assist to return to upright from a tilt. He does  experience neurogenic B/B and is reliant on TotalA for all pericare care and hygiene. An abductor hip corpus with adjustable and removable hardware will assist with maintaining neutral hip positioning. This wheelchair modification will also provide the patient with a prolonged passive stretch to his bilateral adductors to allow for Botox  injections to be more effective for long term management of his tone. The patients need for these additions to his current group 3 power wheelchair is a lifelong medical need. Without correcting/accommodating for his current posture as a direct result of his hypertonicity, his posture will continue to worsen. This will significantly improve his ease of completing pericare/hygiene and limit his risk for developing new/worsening pressure injuries. These modifications to his current wheelchair will also allow the patient to more effectively and safely participate in his MRADLs.    Delon DELENA Pop, PT Delon DELENA Pop, PT, DPT, CBIS  04/15/2024, 8:34 AM  [facility] [address] [place], [place], [address] Phone: [phone]   Fax:  [phone]

## 2024-04-17 ENCOUNTER — Other Ambulatory Visit (HOSPITAL_COMMUNITY): Payer: Self-pay

## 2024-04-19 ENCOUNTER — Ambulatory Visit: Payer: Self-pay | Admitting: Cardiology

## 2024-04-19 DIAGNOSIS — I480 Paroxysmal atrial fibrillation: Secondary | ICD-10-CM | POA: Diagnosis not present

## 2024-04-22 ENCOUNTER — Other Ambulatory Visit (HOSPITAL_COMMUNITY): Payer: Self-pay

## 2024-04-23 ENCOUNTER — Telehealth: Payer: Self-pay | Admitting: Cardiology

## 2024-04-23 ENCOUNTER — Other Ambulatory Visit (HOSPITAL_COMMUNITY): Payer: Self-pay

## 2024-04-23 NOTE — Telephone Encounter (Signed)
 Spoke with Leon Rodriguez, for 3 weeks the patient has had diarrhea and vomiting. They got antiemetic from his PCP and he was taking it every 8 hours or he would be sick. She stopped the above 3 medications on Saturday and the sickness is completely gone. She is still giving him ibuprofen . Aware dr michele is not in the office and will forward to the pharmacist to help decide what medications to restart.

## 2024-04-23 NOTE — Telephone Encounter (Signed)
 Pt c/o medication issue:  1. Name of Medication: amiodarone  (PACERONE ) 200 MG tablet  colchicine  0.6 MG tablet  pantoprazole  (PROTONIX ) 40 MG tablet  2. How are you currently taking this medication (dosage and times per day)? As written  3. Are you having a reaction (difficulty breathing--STAT)?   4. What is your medication issue? Pt has been throwing up and having diarrhea for two weeks.  Sig other said she has not giving him these three medication since sat so she feels like one of this medications is making him sick.

## 2024-04-24 MED ORDER — COLCHICINE 0.6 MG PO TABS: 0.6000 mg | ORAL_TABLET | Freq: Every day | ORAL | Status: DC

## 2024-04-24 NOTE — Telephone Encounter (Signed)
 Spoke with pt wife, aware of the recommendations. She will let us  know if he has any problems.

## 2024-04-24 NOTE — Telephone Encounter (Signed)
 Most likely culprit is the colchicine . Would resume medications one by one. But would resumes the colchicine  at only once a day to see if that helps.

## 2024-04-28 ENCOUNTER — Ambulatory Visit: Payer: Self-pay | Admitting: Student

## 2024-04-28 ENCOUNTER — Ambulatory Visit (HOSPITAL_COMMUNITY)
Admission: RE | Admit: 2024-04-28 | Discharge: 2024-04-28 | Disposition: A | Discharge status: Home / Self Care | Source: Ambulatory Visit | Attending: Student | Admitting: Student

## 2024-04-28 DIAGNOSIS — I3139 Other pericardial effusion (noninflammatory): Secondary | ICD-10-CM | POA: Diagnosis present

## 2024-04-28 LAB — ECHOCARDIOGRAM COMPLETE
Area-P 1/2: 3.5 cm2
S' Lateral: 2.5 cm

## 2024-04-29 LAB — COMPREHENSIVE METABOLIC PANEL WITH GFR
ALT: 12 IU/L (ref 0–44)
AST: 16 IU/L (ref 0–40)
Albumin: 3.9 g/dL (ref 3.8–4.9)
Alkaline Phosphatase: 172 IU/L — ABNORMAL HIGH (ref 47–123)
BUN/Creatinine Ratio: 21 — ABNORMAL HIGH (ref 9–20)
BUN: 12 mg/dL (ref 6–24)
Bilirubin Total: 0.2 mg/dL (ref 0.0–1.2)
CO2: 24 mmol/L (ref 20–29)
Calcium: 9.7 mg/dL (ref 8.7–10.2)
Chloride: 101 mmol/L (ref 96–106)
Creatinine, Ser: 0.58 mg/dL — ABNORMAL LOW (ref 0.76–1.27)
Globulin, Total: 2.8 g/dL (ref 1.5–4.5)
Glucose: 80 mg/dL (ref 70–99)
Potassium: 4.4 mmol/L (ref 3.5–5.2)
Sodium: 139 mmol/L (ref 134–144)
Total Protein: 6.7 g/dL (ref 6.0–8.5)
eGFR: 112 mL/min/1.73 (ref >59)

## 2024-04-29 LAB — TSH: TSH: 1.21 u[IU]/mL (ref 0.450–4.500)

## 2024-05-01 MED ORDER — AMIODARONE HCL 200 MG PO TABS: 200.0000 mg | ORAL_TABLET | Freq: Every day | ORAL | Status: DC

## 2024-05-04 ENCOUNTER — Other Ambulatory Visit (HOSPITAL_COMMUNITY): Payer: Self-pay

## 2024-05-04 ENCOUNTER — Telehealth: Payer: Self-pay | Admitting: Cardiology

## 2024-05-04 NOTE — Telephone Encounter (Signed)
 Pt c/o medication issue:  1. Name of Medication:   colchicine  0.6 MG tablet    2. How are you currently taking this medication (dosage and times per day)? Unknown   3. Are you having a reaction (difficulty breathing--STAT)? No   4. What is your medication issue? Medication is causing pt to vomit per Significant other    Pt would like a c/b from a nurse please advise

## 2024-05-04 NOTE — Telephone Encounter (Signed)
 Returned call from spouse (DPR). Spouse states that patient had tried 1-2 doses of colchicine  back on 04/23/24 and had to stop due to nausea/vomiting. Patient resumed colchicine  on 05/02/24 as instructed (see telephone encounter 04/23/24). He took a dose that day and a dose yesterday on 05/03/24. Again he was vomiting, unable to keep down any of his medication. At this time spouse reports vomiting has stopped and patient is sleeping. She states she will hold medication til she hears back from our office about next steps.

## 2024-05-07 NOTE — Telephone Encounter (Signed)
 Call to patient/spouse to advise Dr. Michele say to stop colchicine  for now and to keep appt 05/12/24 to discuss next steps. Spouse [name] (DPR) verbalizes understanding and agrees to plan.

## 2024-05-07 NOTE — Telephone Encounter (Signed)
 Okay - hold colchicine  due to intolerance / side effects.   Dr. Kuba Shepherd

## 2024-05-12 ENCOUNTER — Ambulatory Visit: Admitting: Cardiology

## 2024-05-18 ENCOUNTER — Ambulatory Visit: Attending: Cardiology | Admitting: Cardiology

## 2024-05-18 ENCOUNTER — Encounter: Payer: Self-pay | Admitting: Cardiology

## 2024-05-18 VITALS — BP 90/62 | HR 74 | Ht 69.0 in | Wt 198.0 lb

## 2024-05-18 DIAGNOSIS — R7303 Prediabetes: Secondary | ICD-10-CM | POA: Insufficient documentation

## 2024-05-18 DIAGNOSIS — I700 Atherosclerosis of aorta: Secondary | ICD-10-CM | POA: Insufficient documentation

## 2024-05-18 DIAGNOSIS — L89623 Pressure ulcer of left heel, stage 3: Secondary | ICD-10-CM | POA: Insufficient documentation

## 2024-05-18 DIAGNOSIS — K5900 Constipation, unspecified: Secondary | ICD-10-CM | POA: Insufficient documentation

## 2024-05-18 DIAGNOSIS — G8250 Quadriplegia, unspecified: Secondary | ICD-10-CM | POA: Diagnosis not present

## 2024-05-18 DIAGNOSIS — I480 Paroxysmal atrial fibrillation: Secondary | ICD-10-CM | POA: Insufficient documentation

## 2024-05-18 DIAGNOSIS — I3139 Other pericardial effusion (noninflammatory): Secondary | ICD-10-CM | POA: Diagnosis not present

## 2024-05-18 NOTE — Patient Instructions (Addendum)
 Medication Instructions:   Stop taking Colchicine    Stop taking  Ibuprofen    Stop taking  Amiodarone     *If you need a refill on your cardiac medications before your next appointment, please call your pharmacy*   Lab Work: CMP  in 4 weeks  If you have labs (blood work) drawn today and your tests are completely normal, you will receive your results only by: MyChart Message (if you have MyChart) OR A paper copy in the mail If you have any lab test that is abnormal or we need to change your treatment, we will call you to review the results.   Testing/Procedures:  Not needed  Follow-Up: At [facility] you and your health needs are our priority.  As part of our continuing mission to provide you with exceptional heart care, we have created designated Provider Care Teams.  These Care Teams include your primary Cardiologist (physician) and Advanced Practice Providers (APPs -  Physician Assistants and Nurse Practitioners) who all work together to provide you with the care you need, when you need it.     Your next appointment:   As needed   The format for your next appointment:   In Person  Provider:   Madonna Large, DO   Other Instructions    Stop taking  Ibuprofen 

## 2024-05-18 NOTE — Progress Notes (Signed)
 Cardiology Office Note:  .   Date:  05/18/2024  ID:  Leon Rodriguez, DOB 10-Dec-1965, MRN [id number] PCP:  Marvene Prentice SAUNDERS, FNP  Former Cardiology Providers: None [facility] Providers Cardiologist:  Madonna Large, DO , [facility] (established care 03/04/2024) Electrophysiologist:  None  Click to update primary MD,subspecialty MD or APP then REFRESH:1}    Chief Complaint  Patient presents with   Follow-up    Pericardial effusion, follow-up echocardiogram and labs    History of Present Illness: .   Leon Rodriguez is a 58 y.o. African-American male whose past medical history and cardiovascular risk factors includes: Pericardial effusion, alone atrial fibrillation, Aortic atherosclerosis, hx of cervical myelopathy with spasticity, neurogenic bowel and bladder, arthritis, renal calculi, tobacco/THC use, hx sepsis.  Patient was seen during his hospitalization September 2025 for pericardial effusion.  It was felt that the pericardial effusion along with precordial discomfort were consistent with an episode of pericarditis.  Inflammatory markers are elevated.  Patient was started on NSAIDs and colchicine .  During the hospitalization patient went into atrial fibrillation and was initially treated with rate control strategy with metoprolol .  However with metoprolol  patient's blood pressure dropped and therefore was transition to IV amiodarone .  Patient was placed on oral amiodarone  at the time of discharge.  Given his CHA2DS2-VASc anticoagulation was not initiated.  His echocardiogram in September 2025 noted moderate pericardial effusion with no tamponade physiology.  LVEF was reported to be 35-40% and estimated RAP was 15 mmHg. At the time of discharge the tentative plan was to continue colchicine  and high-dose NSAIDs for the treatment of pericarditis, repeat echocardiogram as outpatient to reevaluate LVEF, if LVEF remains reduced may consider oral anticoagulation after shared decision.    Patient presents today for follow-up.  Patient is accompanied  by his girlfriend Leon Rodriguez at today's office visit.  Patient provides verbal consent with having her present during today's encounter.  Since last office visit patient's girlfriend and called the office that he has tried colchicine  and separate occasions but has not tolerated it due to nausea and vomiting.  Shared decision was to hold off.  He did have follow-up labs for amiodarone  on 11//2025 which notes stable TSH, AST/ALT.  Alkaline phosphatase was slightly elevated (isolated finding) and amiodarone  was reduced to 200 mg p.o. daily.  He had a repeat echocardiogram 11//2025 which notes preserved LVEF at 60-65%, and no evidence of pericardial effusion, per report  Patient denies anginal chest pain or heart failure symptoms. He is back to baseline from a cardiovascular standpoint. Reviewed the results of the echocardiogram, and labs with both patient and his girlfriend Leon Rodriguez at today's visit.   Review of Systems: .   Review of Systems  Cardiovascular:  Negative for chest pain, claudication, irregular heartbeat, leg swelling, near-syncope, orthopnea, palpitations, paroxysmal nocturnal dyspnea and syncope.  Respiratory:  Negative for shortness of breath.   Hematologic/Lymphatic: Negative for bleeding problem.    Studies Reviewed:   EKG: EKG Interpretation Date/Time:  Monday May 18 2024 09:27:07 EST Ventricular Rate:  74 PR Interval:  168 QRS Duration:  88 QT Interval:  376 QTC Calculation: 417 R Axis:   165  Text Interpretation: Normal sinus rhythm Right axis deviation When compared with ECG of 23-Mar-2024 08:10, Nonspecific T wave abnormality no longer evident in Lateral leads Confirmed by Large Madonna [phone]) on 05/18/2024 9:49:25 AM  Echocardiogram: 03/06/2024  1. Moderate pericardial effusion. IVC is fixed and dilated, and unable to  evaluate mitral inflow for respiratory variation  due to Afib, but no RA/RV   collapse seen to suggest tamponade   2. Left ventricular ejection fraction, by estimation, is 35 to 40%. The  left ventricle has moderately decreased function. Compared to echo  03/06/24, patient is now in Afib with RVR and systolic function has  worsened   3. Right ventricular systolic function is moderately reduced. The right  ventricular size is normal.   4. The inferior vena cava is dilated in size with <50% respiratory  variability, suggesting right atrial pressure of 15 mmHg.   04/28/2024  1. Left ventricular ejection fraction, by estimation, is 60 to 65%. The  left ventricle has normal function. The left ventricle has no regional  wall motion abnormalities. Left ventricular diastolic parameters were  normal.   2. Right ventricular systolic function is normal. The right ventricular  size is normal. Tricuspid regurgitation signal is inadequate for assessing  PA pressure.   3. The mitral valve is normal in structure. Trivial mitral valve  regurgitation. No evidence of mitral stenosis.   4. The aortic valve is normal in structure. Aortic valve regurgitation is  not visualized. No aortic stenosis is present.   5. The inferior vena cava is normal in size with greater than 50%  respiratory variability, suggesting right atrial pressure of 3 mmHg.   RADIOLOGY: NA  Risk Assessment/Calculations:   NA   Labs:       Latest Ref Rng & Units 03/09/2024    5:19 AM 03/08/2024    4:23 AM 03/07/2024    5:05 AM  CBC  WBC 4.0 - 10.5 K/uL 8.9  10.2  9.6   Hemoglobin 13.0 - 17.0 g/dL 89.4  89.0  88.6   Hematocrit 39.0 - 52.0 % 30.1  31.0  31.8   Platelets 150 - 400 K/uL 354  321  290        Latest Ref Rng & Units 04/28/2024    2:18 PM 03/09/2024    5:19 AM 03/08/2024    4:23 AM  BMP  Glucose 70 - 99 mg/dL 80  95  98   BUN 6 - 24 mg/dL 12  7  7    Creatinine 0.76 - 1.27 mg/dL 9.41  9.48  9.50   BUN/Creat Ratio 9 - 20 21     Sodium 134 - 144 mmol/L 139  139  138   Potassium 3.5 - 5.2  mmol/L 4.4  3.3  3.6   Chloride 96 - 106 mmol/L 101  103  105   CO2 20 - 29 mmol/L 24  25  24    Calcium 8.7 - 10.2 mg/dL 9.7  8.7  8.7       Latest Ref Rng & Units 04/28/2024    2:18 PM 03/09/2024    5:19 AM 03/08/2024    4:23 AM  CMP  Glucose 70 - 99 mg/dL 80  95  98   BUN 6 - 24 mg/dL 12  7  7    Creatinine 0.76 - 1.27 mg/dL 9.41  9.48  9.50   Sodium 134 - 144 mmol/L 139  139  138   Potassium 3.5 - 5.2 mmol/L 4.4  3.3  3.6   Chloride 96 - 106 mmol/L 101  103  105   CO2 20 - 29 mmol/L 24  25  24    Calcium 8.7 - 10.2 mg/dL 9.7  8.7  8.7   Total Protein 6.0 - 8.5 g/dL 6.7     Total Bilirubin 0.0 - 1.2 mg/dL 0.2  Alkaline Phos 47 - 123 IU/L 172     AST 0 - 40 IU/L 16     ALT 0 - 44 IU/L 12       No results found for: CHOL, HDL, LDLCALC, LDLDIRECT, TRIG, CHOLHDL No results for input(s): LIPOA in the last 8760 hours. No components found for: NTPROBNP No results for input(s): PROBNP in the last 8760 hours. Recent Labs    03/04/24 2042 04/28/24 1418  TSH 1.372 1.210    Physical Exam:    Today's Vitals   05/18/24 0924  BP: 90/62  Pulse: 74  SpO2: 96%  Weight: 198 lb (89.8 kg)  Height: 5' 9 (1.753 m)   Body mass index is 29.24 kg/m. Wt Readings from Last 3 Encounters:  05/18/24 198 lb (89.8 kg)  03/23/24 200 lb (90.7 kg)  03/04/24 200 lb 9.9 oz (91 kg)    Physical Exam  Constitutional: No distress. He appears chronically ill.  hemodynamically stable, presents in a motorized chair  Neck: No JVD present.  Cardiovascular: Normal rate, regular rhythm, S1 normal and S2 normal. Exam reveals no gallop, no S3 and no S4.  No murmur heard. Pulmonary/Chest: Effort normal and breath sounds normal. No stridor. He has no wheezes. He has no rales.  Musculoskeletal:        General: No edema.     Cervical back: Neck supple.  Skin: Skin is warm.     Impression & Recommendation(s):  Impression:   ICD-10-CM   1. Pericardial effusion  I31.39 EKG 12-Lead     Comprehensive metabolic panel with GFR    Comprehensive metabolic panel with GFR    2. Paroxysmal atrial fibrillation (HCC)  I48.0     3. Aortic atherosclerosis  I70.0     4. Quadriplegia (HCC)  G82.50        Recommendation(s):  Pericardial effusion Noted during his hospitalization in September 2025 Clinical presentation concerning for pericarditis, started on colchicine , NSAIDs. Patient unable to tolerate colchicine  despite multiple attempts. Repeat echocardiogram in November 2025 notes no pericardial effusion and clinically has no symptoms to suggest pericarditis Recommend stopping NSAIDs.  Paroxysmal atrial fibrillation (HCC) Newly discovered during a hospitalization in September 2025 in the setting of acute pericarditis Converted to sinus rhythm after starting amiodarone  Was started on metoprolol  for rate control strategy during the hospitalization.  But discontinued due to bradycardia and hypotension.  He had a brief episode of left 2-1 AV block. During his hospitalization anticoagulation was not started due to low CHA2DS2-VASc score and short period of A-fib.  Plan was to repeat echocardiogram after the acute illness and if LVEF is still depressed then it could be reconsidered. Follow-up echocardiogram in November 2025 notes recovered LV function, and no pericardial effusion. Amiodarone  labs noted stable TSH, AST, ALT.  Isolated finding of elevation of alkaline phosphatase and therefore amiodarone  dose was reduced in the interim.  Patient is currently taking amiodarone  200 mg p.o. daily. Patient has remained in sinus rhythm, LVEF has recovered, shared decision is to discontinue amiodarone  for now and monitor. Will repeat CMP in 4 weeks to make sure that alkaline phosphatase levels are within normal limits otherwise he will need to be referred to PCP for further workup.  Patient and his girlfriend agreeable to plan  Aortic atherosclerosis Noted on CT abdomen pelvis  03/04/2024 Recommend discussing lipid-lowering agents with PCP as part of longitudinal care and secondary prevention  Orders Placed:  Orders Placed This Encounter  Procedures   Comprehensive metabolic panel with  GFR    Standing Status:   Future    Number of Occurrences:   1    Expected Date:   06/17/2024    Expiration Date:   05/18/2025   EKG 12-Lead     Final Medication List:   No orders of the defined types were placed in this encounter.   Medications Discontinued During This Encounter  Medication Reason   colchicine  0.6 MG tablet Discontinued by provider   amiodarone  (PACERONE ) 200 MG tablet Discontinued by provider     Current Outpatient Medications:    acetaminophen  (TYLENOL ) 325 MG tablet, Take 2 tablets (650 mg total) by mouth every 4 (four) hours as needed for fever or mild pain (pain score 1-3)., Disp: 100 tablet, Rfl: 0   albuterol  (VENTOLIN  HFA) 108 (90 Base) MCG/ACT inhaler, Inhale 1-2 puffs into the lungs every 4 (four) hours as needed for wheezing or shortness of breath., Disp: 18 g, Rfl: 2   ascorbic acid  (VITAMIN C ) 1000 MG tablet, Take 1 tablet (1,000 mg total) by mouth daily., Disp: 30 tablet, Rfl: 3   baclofen  (LIORESAL ) 20 MG tablet, Take 2 tablets (40 mg total) by mouth 4 (four) times daily. 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, Disp: 240 each, Rfl: 5   dantrolene  (DANTRIUM ) 100 MG capsule, Take 1 capsule (100 mg total) by mouth 3 (three) times daily., Disp: 90 capsule, Rfl: 5   diazepam  (VALIUM ) 10 MG tablet, Take 0.5-1 tablets (5-10 mg total) by mouth every 6 (six) hours as needed., Disp: 120 tablet, Rfl: 5   docusate sodium  (ENEMEEZ) 283 MG enema, Place 1 enema (283 mg total) rectally daily as needed for severe constipation., Disp: 15 each, Rfl: 0   DULoxetine  (CYMBALTA ) 60 MG capsule, Take 1 capsule (60 mg total) by mouth daily., Disp: 90 capsule, Rfl: 1   guaiFENesin  (MUCINEX ) 600 MG 12 hr tablet, Take 1 tablet (600 mg total) by mouth 2 (two) times daily. For Symptoms  from pneumonia, Disp: 30 tablet, Rfl: 5   lidocaine  (LIDODERM ) 5 %, Place 2 patches onto the skin daily. For chronic back pain- and hip pain- to reduce amount of opiates giving pt-Remove & Discard patch within 12 hours or as directed by MD, Disp: 60 patch, Rfl: 5   lidocaine  (XYLOCAINE ) 2 % jelly, Apply topically as needed (Use with in and out catheter)., Disp: 30 mL, Rfl: 0   morphine  (MSIR) 15 MG tablet, Take 1 tablet (15 mg total) by mouth every 6 (six) hours as needed for severe pain (pain score 7-10) (max 3x/day). For chronic pain due to Spinal cord injury- G89.2, Disp: 90 tablet, Rfl: 0   morphine  (MSIR) 15 MG tablet, Take 1 tablet (15 mg total) by mouth every 4 (four) hours as needed for severe pain (pain score 7-10). G89.2- chronic pain due to SCI- can refill 28 days after 9/25, Disp: 90 tablet, Rfl: 0   ondansetron  (ZOFRAN ) 4 MG tablet, Take 1-2 tablets by mouth as needed for nausea / vomiting every 8 hours, Disp: 60 tablet, Rfl: 0   oxyCODONE  (ROXICODONE ) 15 MG immediate release tablet, Take 1 tablet (15 mg total) by mouth every 6 (six) hours as needed for severe pain (pain score 7-10)., Disp: 120 tablet, Rfl: 0   pantoprazole  (PROTONIX ) 40 MG tablet, Take 1 tablet (40 mg total) by mouth 2 (two) times daily., Disp: 180 tablet, Rfl: 3   polyethylene glycol powder (MIRALAX ) 17 GM/SCOOP powder, Mix 17 g in 4 oz of liquid & take by mouth daily.,  Disp: 510 g, Rfl: 5   QUEtiapine  (SEROQUEL ) 50 MG tablet, Take 1 tablet (50 mg total) by mouth at bedtime., Disp: 30 tablet, Rfl: 5   senna (SENOKOT) 8.6 MG tablet, Take 3 tablets by mouth daily., Disp: , Rfl:    tamsulosin  (FLOMAX ) 0.4 MG CAPS capsule, Take 1 capsule (0.4 mg total) by mouth every evening., Disp: 90 capsule, Rfl: 1   tiZANidine  (ZANAFLEX ) 2 MG tablet, Take 1 tablet (2 mg total) by mouth 3 (three) times daily., Disp: 90 tablet, Rfl: 2  Consent:   NA  Disposition:   Since he is not having symptoms suggestive of pericarditis, follow-up  echo notes recovered LVEF and no pericardial effusion, recommended either 1 year follow-up or as needed.  Patient and his girlfriend requesting as needed follow-up which is quite appropriate.  Encouraged him to continue follow-up with PCP for longitudinal care  His questions and concerns were addressed to his satisfaction. He voices understanding of the recommendations provided during this encounter.    Signed, Madonna Michele HAS, [facility]  A Division of [facility] [address] [place], [place] [address]  05/18/2024 10:21 AM

## 2024-05-19 ENCOUNTER — Other Ambulatory Visit: Payer: Self-pay

## 2024-05-19 ENCOUNTER — Other Ambulatory Visit (HOSPITAL_COMMUNITY): Payer: Self-pay

## 2024-05-20 ENCOUNTER — Other Ambulatory Visit (HOSPITAL_COMMUNITY): Payer: Self-pay

## 2024-05-22 ENCOUNTER — Other Ambulatory Visit (HOSPITAL_COMMUNITY): Payer: Self-pay

## 2024-05-25 ENCOUNTER — Other Ambulatory Visit (HOSPITAL_COMMUNITY): Payer: Self-pay

## 2024-05-25 ENCOUNTER — Encounter: Attending: Physical Medicine and Rehabilitation | Admitting: Physical Medicine and Rehabilitation

## 2024-05-25 DIAGNOSIS — L89303 Pressure ulcer of unspecified buttock, stage 3: Secondary | ICD-10-CM | POA: Insufficient documentation

## 2024-05-25 DIAGNOSIS — R252 Cramp and spasm: Secondary | ICD-10-CM | POA: Diagnosis present

## 2024-05-25 DIAGNOSIS — F332 Major depressive disorder, recurrent severe without psychotic features: Secondary | ICD-10-CM | POA: Diagnosis present

## 2024-05-25 DIAGNOSIS — E43 Unspecified severe protein-calorie malnutrition: Secondary | ICD-10-CM | POA: Diagnosis present

## 2024-05-25 DIAGNOSIS — G959 Disease of spinal cord, unspecified: Secondary | ICD-10-CM | POA: Insufficient documentation

## 2024-05-25 DIAGNOSIS — G8250 Quadriplegia, unspecified: Secondary | ICD-10-CM | POA: Diagnosis present

## 2024-05-25 DIAGNOSIS — M62838 Other muscle spasm: Secondary | ICD-10-CM | POA: Insufficient documentation

## 2024-05-25 MED ORDER — ONABOTULINUMTOXINA 100 UNITS IJ SOLR
700.0000 [IU] | Freq: Once | INTRAMUSCULAR | Status: AC
  Administered 2024-05-25: 700 [IU] via INTRAMUSCULAR

## 2024-05-25 MED ORDER — SODIUM CHLORIDE (PF) 0.9 % IJ SOLN: 7.0000 mL | INTRAMUSCULAR | Status: AC

## 2024-05-25 NOTE — Progress Notes (Signed)
 Leon Rodriguez is a 58 y.o. year old male  who  has a past medical history of Arthritis, Hip pain, Renal calculi, and Sepsis (HCC) (06/13/2023).   They are presenting to PM&R clinic for follow up related to bilateral lower extremity spasticity .   Has appt in April for MRI, to determine if he can get baclofen  pump placed. Delayed due to needing to be under sedation - was told [facility] would be faster than [facility]? Some benefit from Botox  but no functional gains, mostly helps with caregiver and hygiene.   Botolulinum Toxin Injection: [x ] BOTOX  (onabotulinumtoxinA ) [_] DYSPORT (abobotulinumtoxinA) [_] Xeomin (Incobotulinum toxin A)   Goals with treatment: [x ] Decrease spasms/ abnormal movements [x ] Improve Active / Passive ROM [x ] Improve ADLs [ x] Improve functional mobility [ ]  Improve gait mechanics [x ] Improve positioning/posture [x ] Prevent contracture  [x ] Prevent joint destruction [ x] Prevent skin breakdown [ x] Decrease caregiver burden [ x] Improve hygiene [ x] Improve Pain   MEDICATION:  ONAbotulinum toxin. 700 Units     NaCl 4 ml   CONSENT: Obtained in writing followed by time-out per policy. Consent uploaded to chart.   Benefits discussed included, but were not limited to, decreased muscle tightness and spasticity, increased joint range of motion, improved limb positioning and facilitation of hygiene and nursing care.    Risks discussed included, but were not limited to, pain and discomfort, bleeding, bruising, excessive weakness, venous thrombosis, muscle atrophy, and distant spread of toxin which could include generalized muscle weakness, diplopia, blurred vision, ptosis, dysphagia, dysphonia, dysarthria, urinary incontinence and breathing difficulties. These symptoms have been reported hours to weeks after injection. Swallowing and breathing difficulties may be life threatening, and there have been reports of death. Patient/Family member/Guardian/Caregiver have  been offered botulinum toxin informational material upon initial consultation and this information has been continually available. All questions answered to patient/family member/guardian/ caregiver satisfaction. They would like to proceed with procedure. There are no noted contraindications to procedure.   PROCEDURE [x]  Without Ultrasound: Patient was placed in a position with the appropriate muscles exposed, located and identified. The skin was cleaned with ChloraPrep and ethyl chloride was sprayed for topical anesthetic. Using combination EMG amplification and electrical stimulation, the following muscles were identified using anatomical landmarks described by Perotto, et al (1994) and injected following aspiration to ensure blood vessels were avoided.   [_ ] With Ultrasound: Patient was placed in a position with the appropriate muscles exposed, located and identified. The skin was cleaned with ChloraPrep and ethyl chloride was sprayed for topical anesthetic. Using combination Ultrasound guidance for anatomical guidance, avoidance of significant vasculature, to decrease the risk of hematoma formation and ensure botox  was placed in the correct location; EMG amplification and electrical stimulation, the following muscles were identified and injected following aspiration to ensure blood vessels were avoided. A _linear transducer was used during the procedure. The botulinum toxin was visualized entering the appropriate musculature.    MUSCLE: Units /Sites  425 U LLE 50 U psoas  -> 100 U 100 U hamstrings - 50 medial, 50 lateral, 2 sites ea -> 150 (75u/75u) 50 U adductors --> 150 U (50 ea magnus/brevis/longus)  L rectus femoris - 25 U   275 U RLE 50 U psoas -> 75 U 100 U hamstrings - 50 medial, 50 lateral, 2 sites ea --> 130 U (65/65)--> 100 U (50/50) 50 U adductors --> 70 U (30 longus, 40 magnus)  Add R  rectus femoris - 30 U  700 units were injected without difficulty. No complications were  encountered. The patient tolerated the procedure well. Wasted 0   PLAN: - Resume Usual Activities. Notify Physician of any unusual bleeding, erythema or concern for side effects as reviewed above. - Apply ice prn for pain - Tylenol  prn for pain - Follow up in 3 months  - will switch to Xeomin if insurance covers

## 2024-05-25 NOTE — Patient Instructions (Signed)
-   Resume Usual Activities. Notify Physician of any unusual bleeding, erythema or concern for side effects as reviewed above. - Apply ice prn for pain - Tylenol  prn for pain - Follow up in 3 months  - will switch to Xeomin if insurance allows

## 2024-06-02 ENCOUNTER — Other Ambulatory Visit (HOSPITAL_COMMUNITY): Payer: Self-pay

## 2024-06-02 ENCOUNTER — Other Ambulatory Visit: Payer: Self-pay | Admitting: Surgery

## 2024-06-02 ENCOUNTER — Telehealth (HOSPITAL_COMMUNITY): Payer: Self-pay

## 2024-06-02 ENCOUNTER — Other Ambulatory Visit: Payer: Self-pay

## 2024-06-03 ENCOUNTER — Telehealth (HOSPITAL_COMMUNITY): Payer: Self-pay

## 2024-06-03 ENCOUNTER — Other Ambulatory Visit (HOSPITAL_COMMUNITY): Payer: Self-pay

## 2024-06-03 NOTE — Telephone Encounter (Signed)
 Pharmacy Patient Advocate Encounter   Received notification from Pt Calls Messages that prior authorization for Morphine  Sulfate 15MG  tablets  is required/requested.   Insurance verification completed.   The patient is insured through [employer].   Per test claim: PA required; PA submitted to above mentioned insurance via Latent Key/confirmation #/EOC [id number] Status is pending

## 2024-06-03 NOTE — Telephone Encounter (Signed)
 PA request has been Received. New Encounter has been or will be created for follow up. For additional info see Pharmacy Prior Auth telephone encounter from 06/03/24.

## 2024-06-08 ENCOUNTER — Other Ambulatory Visit (HOSPITAL_COMMUNITY): Payer: Self-pay

## 2024-06-08 NOTE — Telephone Encounter (Signed)
 Additional information has been requested from the patient's insurance in order to proceed with the prior authorization request. Requested information has been sent, or form has been filled out and faxed back to [phone]

## 2024-06-08 NOTE — Telephone Encounter (Signed)
 Pharmacy Patient Advocate Encounter  Received notification from [employer] that Prior Authorization for  Morphine  Sulfate 15MG  tablets  has been APPROVED from 06/08/24 to 12/05/24. Ran test claim, Copay is $4. This test claim was processed through [facility]- copay amounts may vary at other pharmacies due to pharmacy/plan contracts, or as the patient moves through the different stages of their insurance plan.   PA #/Case ID/Reference #: [id number]

## 2024-06-12 ENCOUNTER — Encounter: Admitting: Physical Medicine and Rehabilitation

## 2024-06-12 ENCOUNTER — Encounter: Payer: Self-pay | Admitting: Physical Medicine and Rehabilitation

## 2024-06-12 ENCOUNTER — Other Ambulatory Visit (HOSPITAL_COMMUNITY): Payer: Self-pay

## 2024-06-12 VITALS — BP 94/67 | HR 80

## 2024-06-12 DIAGNOSIS — F332 Major depressive disorder, recurrent severe without psychotic features: Secondary | ICD-10-CM | POA: Diagnosis not present

## 2024-06-12 DIAGNOSIS — L89303 Pressure ulcer of unspecified buttock, stage 3: Secondary | ICD-10-CM | POA: Diagnosis not present

## 2024-06-12 DIAGNOSIS — G8250 Quadriplegia, unspecified: Secondary | ICD-10-CM | POA: Diagnosis not present

## 2024-06-12 DIAGNOSIS — M62838 Other muscle spasm: Secondary | ICD-10-CM

## 2024-06-12 DIAGNOSIS — E43 Unspecified severe protein-calorie malnutrition: Secondary | ICD-10-CM | POA: Diagnosis not present

## 2024-06-12 DIAGNOSIS — R252 Cramp and spasm: Secondary | ICD-10-CM | POA: Diagnosis not present

## 2024-06-12 MED ORDER — BUPROPION HCL ER (XL) 150 MG PO TB24
150.0000 mg | ORAL_TABLET | Freq: Every day | ORAL | 5 refills | Status: AC
  Filled 2024-06-12: qty 30, 30d supply, fill #0
  Filled 2024-07-14: qty 30, 30d supply, fill #1

## 2024-06-12 MED ORDER — MEGESTROL ACETATE 400 MG/10ML PO SUSP
800.0000 mg | Freq: Every day | ORAL | 5 refills | Status: AC
  Filled 2024-06-12: qty 600, 30d supply, fill #0
  Filled 2024-07-11: qty 600, 30d supply, fill #1

## 2024-06-12 MED ORDER — BACLOFEN 20 MG PO TABS
40.0000 mg | ORAL_TABLET | Freq: Four times a day (QID) | ORAL | 5 refills | Status: AC
  Filled 2024-06-12: qty 240, 30d supply, fill #0
  Filled 2024-07-17: qty 240, 30d supply, fill #1

## 2024-06-12 NOTE — Patient Instructions (Signed)
 Pt is a 58 yr old male with hx of severe cervical myelopathy s/p ACDF and PCDF C6-T1 by Dr Carollee- in January 2025 with severe uncontrolled spasticity as well as neurogenic bowel and bladder   Here for  f/u from CIR for SCI  Con't Tizanidine  2 mg 3x/day- for spasticity  2.  Sleepy when gets full Vaium 5 g 4x/day- doesn't need refills.   3.   Won't eat cheese, yogurt, no nuts, and won't eat ice cream since picky- tired of eggs as well- so giving peanut butter in bread in AM- wants wonton soup-  how about the taking wonton soup  4. Poor appetite-   Needs to eat until you're full- wounds will get worse.  And they won't heal anymore.   5.  Dry skin- and no BPH- Stop Flomax /Tamsulosin -  just can stop it.    6. Severe calorie malnutrition-Megace- too sleepy already for Remeron or Periactin- so will try Megace 800 ml daily- for anorexia- weight appears ~ 30-40 lbs down with severe protein/calorie malnutrition.  7. Depression-  will try adding Wellbutrin 150 mg XL daily- can try for 4 weeks and if not better, we can increase to 300 mg.   8. Called [facility] to try and get MRI with sedation moved up-is scheduled for April- trying to move up  9.  Went over ITB pump and risk of infection and risk of pump/catheter malfunction    10. Try CAP- program- need to apply for that- to see if can get coverage to get him covered so GF can go back to work- at least 8 hours 5 days/week. Call Social workers to see if can help    11. F/U in 3 months double appt- SCI- and f/u with Dr Emeline already for Botox - last saw Dr Emeline 05/25/24- has appt in early March-

## 2024-06-12 NOTE — Progress Notes (Signed)
 "  Subjective:    Patient ID: Leon Rodriguez, male    DOB: 04-10-1966, 58 y.o.   MRN: [id number]  HPI  Pt is a 58 yr old male with hx of severe cervical myelopathy s/p ACDF and PCDF C6-T1 by Dr Carollee- in January 2025 with severe uncontrolled spasticity as well as neurogenic bowel and bladder   Here for  f/u from CIR for SCI   Doesn't take MSIR regularly- it makes him sleepy- just takes at night,  rarely during day. Mainly takes at night time  After Botox , spasticity and baclofen  working better lately.   Takes Valium  1/2 to 1 tab  4x/day- but don't always take the full dose because it makes him sleepy.    Still Dantrolene  and Zanaflex  as well   No meds for heart anymore! No more fluid around his heart.   Still taking Seroquel  50 mg QHS   Appetite not good- protein shakes hates them- but takes 1-2x/day depends on if can get him to take them  4th and 5th digits getting numb  Doesn't want to see Wound care for wound backside/butt So GF treating herself-  Now dry- was wet- and was Stage III- with slough- cleaned with betadine and dried up- so much better   Still has foley- being changed regularly Moving bowel 2-3x/day not constipated- soft stool Takes miralax  and senna- 3 pills qAM Doesn't do bowel program since going regularly.  A lot of fruit, etc for lunch/dinner Takes 2-3 bites and then that's it.   Doesn't want ot gain weight because GF moving him.    In bed all the time- not going to garage or doing things as much- admits he's depressed    Pain Inventory Average Pain 7 Pain Right Now 5 My pain is dull  In the last 24 hours, has pain interfered with the following? General activity 0 Relation with others 0 Enjoyment of life 6 What TIME of day is your pain at its worst? night Sleep (in general) Poor  Pain is worse with: moving Pain improves with: medication Relief from Meds: 3  Family History  Family history unknown: Yes   Social History    Socioeconomic History   Marital status: Married    Spouse name: Not on file   Number of children: Not on file   Years of education: Not on file   Highest education level: Not on file  Occupational History   Not on file  Tobacco Use   Smoking status: Every Day    Types: Cigars   Smokeless tobacco: Never  Vaping Use   Vaping status: Never Used  Substance and Sexual Activity   Alcohol use: Not Currently   Drug use: Yes    Types: Marijuana   Sexual activity: Not on file  Other Topics Concern   Not on file  Social History Narrative   Not on file   Social Drivers of Health   Tobacco Use: High Risk (05/19/2024)   Received from [facility]   Patient History    Smoking Tobacco Use: Every Day    Smokeless Tobacco Use: Never    Passive Exposure: Current  Financial Resource Strain: Not on file  Food Insecurity: No Food Insecurity (03/05/2024)   Epic    Worried About Programme Researcher, Broadcasting/film/video in the Last Year: Never true    Ran Out of Food in the Last Year: Never true  Transportation Needs: No Transportation Needs (03/05/2024)   Epic    Lack of  Transportation (Medical): No    Lack of Transportation (Non-Medical): No  Physical Activity: Not on file  Stress: Not on file  Social Connections: Moderately Isolated (03/05/2024)   Social Connection and Isolation Panel    Frequency of Communication with Friends and Family: Twice a week    Frequency of Social Gatherings with Friends and Family: Twice a week    Attends Religious Services: Never    Database Administrator or Organizations: No    Attends Banker Meetings: Never    Marital Status: Living with partner  Depression (PHQ2-9): Low Risk (03/18/2024)   Depression (PHQ2-9)    PHQ-2 Score: 2  Recent Concern: Depression (PHQ2-9) - Medium Risk (01/20/2024)   Depression (PHQ2-9)    PHQ-2 Score: 6  Alcohol Screen: Not on file  Housing: Low Risk (03/05/2024)   Epic    Unable to Pay for Housing in the Last Year: No    Number  of Times Moved in the Last Year: 0    Homeless in the Last Year: No  Utilities: Not At Risk (03/05/2024)   Epic    Threatened with loss of utilities: No  Health Literacy: Not on file   Past Surgical History:  Procedure Laterality Date   ANTERIOR CERVICAL DECOMP/DISCECTOMY FUSION N/A 06/05/2023   Procedure: ANTERIOR CERVICAL DISCECTOMY AND FUSION CERVICAL SIX-SEVEN/CERVICAL SEVEN-THORACIC ONE;  Surgeon: Carollee Lani BROCKS, DO;  Location: [facility] OR;  Service: Neurosurgery;  Laterality: N/A;  3C   APPENDECTOMY     CIRCUMCISION     HIATAL HERNIA REPAIR     HIP SURGERY     at 20 and 58 yo   POSTERIOR CERVICAL FUSION/FORAMINOTOMY N/A 07/12/2023   Procedure: POSTERIOR CERVICAL LAMINECTOMY, INSTRUMENTATION AND FUSION CERVICAL SIX-CERVICAL SEVEN, CERVICAL SEVEN-THORACIC ONE;  Surgeon: Dawley, Lani BROCKS, DO;  Location: [facility] OR;  Service: Neurosurgery;  Laterality: N/A;   Past Surgical History:  Procedure Laterality Date   ANTERIOR CERVICAL DECOMP/DISCECTOMY FUSION N/A 06/05/2023   Procedure: ANTERIOR CERVICAL DISCECTOMY AND FUSION CERVICAL SIX-SEVEN/CERVICAL SEVEN-THORACIC ONE;  Surgeon: Carollee Lani BROCKS, DO;  Location: [facility] OR;  Service: Neurosurgery;  Laterality: N/A;  3C   APPENDECTOMY     CIRCUMCISION     HIATAL HERNIA REPAIR     HIP SURGERY     at 53 and 58 yo   POSTERIOR CERVICAL FUSION/FORAMINOTOMY N/A 07/12/2023   Procedure: POSTERIOR CERVICAL LAMINECTOMY, INSTRUMENTATION AND FUSION CERVICAL SIX-CERVICAL SEVEN, CERVICAL SEVEN-THORACIC ONE;  Surgeon: Dawley, Lani BROCKS, DO;  Location: [facility] OR;  Service: Neurosurgery;  Laterality: N/A;   Past Medical History:  Diagnosis Date   Arthritis    Hip pain    Renal calculi    left   Sepsis (HCC) 06/13/2023   BP 94/67   Pulse 80   SpO2 95%   Opioid Risk Score:   Fall Risk Score:  `1  Depression screen [facility] 2/9     03/18/2024   11:40 AM 02/19/2024    1:23 PM 01/20/2024    3:03 PM 12/04/2023   10:02 AM 12/04/2023   10:00 AM 11/20/2023    8:56 AM 10/09/2023    10:28 AM  Depression screen PHQ 2/9  Decreased Interest 1 0 3 3 0 2 0  Down, Depressed, Hopeless 1 0 3 3 0 2 0  PHQ - 2 Score 2 0 6 6 0 4 0  Altered sleeping  0  1     Tired, decreased energy  0  3     Change in appetite  0  0     Feeling bad or failure about yourself   0  0     Trouble concentrating  0  0     Moving slowly or fidgety/restless  0  0     Suicidal thoughts  0  0     PHQ-9 Score  0   10      Difficult doing work/chores  Not difficult at all  Somewhat difficult        Data saved with a previous flowsheet row definition     Review of Systems  Musculoskeletal:        Pain from waist down, front and back  All other systems reviewed and are negative.      Objective:   Physical Exam   Asleep hard to wake up,  sleeping with mouth open.   Wearing Prevalons- gaunt/cachetic  Face gaunt Neuro: Spasticity slightly better in Ue's- and LE's- but still MAS of 3-4 in LE's Snoring intermittently     Assessment & Plan:   Pt is a 58 yr old male with hx of severe cervical myelopathy s/p ACDF and PCDF C6-T1 by Dr Carollee- in January 2025 with severe uncontrolled spasticity as well as neurogenic bowel and bladder   Here for  f/u from CIR for SCI  Con't Tizanidine  2 mg 3x/day- for spasticity  2.  Sleepy when gets full Vaium 5 g 4x/day- doesn't need refills.   3.   Won't eat cheese, yogurt, no nuts, and won't eat ice cream since picky- tired of eggs as well- so giving peanut butter in bread in AM- wants wonton soup-  how about the taking wonton soup  4. Poor appetite-   Needs to eat until you're full- wounds will get worse.  And they won't heal anymore.   5.  Dry skin- and no BPH- Stop Flomax /Tamsulosin -  just can stop it.    6. Severe calorie malnutrition-Megace- too sleepy already for Remeron or Periactin- so will try Megace 800 ml daily- for anorexia- weight appears ~ 30-40 lbs down with severe protein/calorie malnutrition.  7. Depression-  will try adding  Wellbutrin 150 mg XL daily- can try for 4 weeks and if not better, we can increase to 300 mg.   8. Called [facility] to try and get MRI with sedation moved up-is scheduled for April- trying to move up  9.  Went over ITB pump and risk of infection and risk of pump/catheter malfunction    10. Try CAP- program- need to apply for that- to see if can get coverage to get him covered so GF can go back to work- at least 8 hours 5 days/week. Call Social workers to see if can help    11. F/U in 3 months double appt- SCI- and f/u with Dr Emeline already for Botox - last saw Dr Emeline 05/25/24- has appt in early March-    I spent a total of  46  minutes on total care today- >50% coordination of care- due to d/w pt about ITB pup, malnutrtion, called [employer] as above; depression and CAP program and how to increase food/diet  "

## 2024-06-17 ENCOUNTER — Other Ambulatory Visit (HOSPITAL_COMMUNITY): Payer: Self-pay

## 2024-07-07 ENCOUNTER — Other Ambulatory Visit: Payer: Self-pay | Admitting: Surgery

## 2024-07-07 ENCOUNTER — Telehealth: Payer: Self-pay

## 2024-07-07 ENCOUNTER — Other Ambulatory Visit (HOSPITAL_COMMUNITY): Payer: Self-pay

## 2024-07-07 ENCOUNTER — Other Ambulatory Visit: Payer: Self-pay | Admitting: Physical Medicine and Rehabilitation

## 2024-07-07 NOTE — Telephone Encounter (Signed)
 Leon Rodriguez (Key: [id number]) PA Case ID #: [id number] Need Help? Call us  at [phone] Status sent iconSent to Plan today Drug QUEtiapine  Fumarate 50MG  tablets ePA cloud logo Form [facility] [place]  Medicaid Electronic PA Form [phone] NCPDP)

## 2024-07-08 ENCOUNTER — Telehealth: Payer: Self-pay

## 2024-07-08 NOTE — Telephone Encounter (Signed)
 [employer] called asking if we received a fax about patient catheters and also is needing the lasted clinical notes. Fax# [phone] Ph# [phone]

## 2024-07-10 NOTE — Telephone Encounter (Signed)
 [employer] called back: Please addend the office note to include the diagnosis code and frequency for the cath supplies.  (Fax note to [phone]).

## 2024-07-11 ENCOUNTER — Other Ambulatory Visit (HOSPITAL_COMMUNITY): Payer: Self-pay

## 2024-07-14 ENCOUNTER — Other Ambulatory Visit: Payer: Self-pay | Admitting: Physical Medicine and Rehabilitation

## 2024-07-14 ENCOUNTER — Other Ambulatory Visit: Payer: Self-pay

## 2024-07-14 ENCOUNTER — Other Ambulatory Visit (HOSPITAL_COMMUNITY): Payer: Self-pay

## 2024-07-14 NOTE — Telephone Encounter (Signed)
 Outcome Approved on January 13 by [facility]  Medicaid PA Case: [id number], Status: Approved, Coverage Starts on: 07/07/2024 12:00:00 AM, Coverage Ends on: 07/07/2025 12:00:00 AM. Effective Date: 07/07/2024 Authorization Expiration Date: 07/07/2025

## 2024-07-17 ENCOUNTER — Other Ambulatory Visit (HOSPITAL_COMMUNITY): Payer: Self-pay

## 2024-07-17 NOTE — Telephone Encounter (Signed)
 Requested Prescriptions   Pending Prescriptions Disp Refills   lidocaine  (LIDODERM ) 5 % 60 patch 5    Sig: Place 2 patches onto the skin daily. For chronic back pain- and hip pain- to reduce amount of opiates giving pt-Remove & Discard patch within 12 hours or as directed by MD     Date of patient request: 07/07/2024 Last office visit: 06/12/2024 Upcoming visit: 07/14/2024 Date of last refill: 12/04/2023 Last refill amount: #60 patches 5 refills

## 2024-07-17 NOTE — Telephone Encounter (Signed)
 Requested Prescriptions   Pending Prescriptions Disp Refills   tiZANidine  (ZANAFLEX ) 2 MG tablet 90 tablet 2    Sig: Take 1 tablet (2 mg total) by mouth 3 (three) times daily.     Date of patient request: 07/14/2024 Last office visit: 06/12/2024 Upcoming visit: 09/11/2024 Date of last refill: 03/18/2024 Last refill amount: #90 2 refills

## 2024-07-18 ENCOUNTER — Other Ambulatory Visit (HOSPITAL_COMMUNITY): Payer: Self-pay

## 2024-07-18 MED ORDER — LIDOCAINE 5 % EX PTCH
2.0000 | MEDICATED_PATCH | CUTANEOUS | 5 refills | Status: AC
  Filled 2024-07-18 – 2024-07-23 (×2): qty 60, 30d supply, fill #0

## 2024-07-18 MED ORDER — POLYETHYLENE GLYCOL 3350 17 GM/SCOOP PO POWD
17.0000 g | Freq: Every day | ORAL | 5 refills | Status: AC
  Filled 2024-07-18: qty 476, 28d supply, fill #0

## 2024-07-18 MED ORDER — TIZANIDINE HCL 2 MG PO TABS
2.0000 mg | ORAL_TABLET | Freq: Three times a day (TID) | ORAL | 2 refills | Status: AC
  Filled 2024-07-18: qty 90, 30d supply, fill #0

## 2024-07-20 ENCOUNTER — Other Ambulatory Visit (HOSPITAL_COMMUNITY): Payer: Self-pay

## 2024-07-21 ENCOUNTER — Other Ambulatory Visit (HOSPITAL_COMMUNITY): Payer: Self-pay

## 2024-07-21 MED ORDER — POLYETHYLENE GLYCOL 3350 17 GM/SCOOP PO POWD
17.0000 g | Freq: Every day | ORAL | 5 refills | Status: AC
  Filled 2024-07-21: qty 476, 28d supply, fill #0

## 2024-07-23 ENCOUNTER — Telehealth: Payer: Self-pay | Admitting: *Deleted

## 2024-07-23 ENCOUNTER — Other Ambulatory Visit (HOSPITAL_COMMUNITY): Payer: Self-pay

## 2024-07-23 ENCOUNTER — Other Ambulatory Visit: Payer: Self-pay

## 2024-07-23 NOTE — Telephone Encounter (Signed)
 Prior auth for lidocaine  patches sent to [facility] via CoverMyMeds.  Outcome Approved today by [facility]  Medicaid PA Case: [id number], Status: Approved, Coverage Starts on: 07/23/2024 12:00:00 AM, Coverage Ends on: 07/23/2025 12:00:00 AM. Effective Date: 07/23/2024 Authorization Expiration Date: 07/23/2025  Patient notified by MyChart.

## 2024-07-24 ENCOUNTER — Other Ambulatory Visit (HOSPITAL_COMMUNITY): Payer: Self-pay

## 2024-08-26 ENCOUNTER — Encounter: Admitting: Physical Medicine and Rehabilitation

## 2024-09-11 ENCOUNTER — Encounter: Admitting: Physical Medicine and Rehabilitation
